# Patient Record
Sex: Male | Born: 1940
Health system: Southern US, Community
[De-identification: ages and names within clinical notes are randomized; demographics above are authoritative.]

## PROBLEM LIST (undated history)

## (undated) DIAGNOSIS — I712 Thoracic aortic aneurysm, without rupture, unspecified: Secondary | ICD-10-CM

## (undated) DIAGNOSIS — I429 Cardiomyopathy, unspecified: Secondary | ICD-10-CM

## (undated) DIAGNOSIS — I10 Essential (primary) hypertension: Secondary | ICD-10-CM

## (undated) DIAGNOSIS — E669 Obesity, unspecified: Secondary | ICD-10-CM

## (undated) DIAGNOSIS — M1990 Unspecified osteoarthritis, unspecified site: Secondary | ICD-10-CM

## (undated) DIAGNOSIS — I509 Heart failure, unspecified: Secondary | ICD-10-CM

## (undated) DIAGNOSIS — D696 Thrombocytopenia, unspecified: Secondary | ICD-10-CM

## (undated) DIAGNOSIS — E785 Hyperlipidemia, unspecified: Secondary | ICD-10-CM

## (undated) DIAGNOSIS — I428 Other cardiomyopathies: Secondary | ICD-10-CM

## (undated) DIAGNOSIS — I071 Rheumatic tricuspid insufficiency: Secondary | ICD-10-CM

## (undated) DIAGNOSIS — N1832 Chronic kidney disease, stage 3b: Secondary | ICD-10-CM

## (undated) DIAGNOSIS — I340 Nonrheumatic mitral (valve) insufficiency: Secondary | ICD-10-CM

## (undated) DIAGNOSIS — I5022 Chronic systolic (congestive) heart failure: Secondary | ICD-10-CM

## (undated) DIAGNOSIS — I2510 Atherosclerotic heart disease of native coronary artery without angina pectoris: Secondary | ICD-10-CM

## (undated) HISTORY — DX: Cardiomyopathy, unspecified: I42.9

## (undated) HISTORY — DX: Obesity, unspecified: E66.9

## (undated) HISTORY — DX: Unspecified osteoarthritis, unspecified site: M19.90

## (undated) HISTORY — DX: Essential (primary) hypertension: I10

## (undated) HISTORY — DX: Heart failure, unspecified: I50.9

## (undated) HISTORY — PX: KNEE SURGERY: SHX244

## (undated) HISTORY — DX: Atherosclerotic heart disease of native coronary artery without angina pectoris: I25.10

---

## 1998-06-10 ENCOUNTER — Emergency Department (HOSPITAL_COMMUNITY): Admission: EM | Admit: 1998-06-10 | Discharge: 1998-06-10 | Payer: Self-pay | Admitting: Emergency Medicine

## 1999-11-12 ENCOUNTER — Encounter: Payer: Self-pay | Admitting: Emergency Medicine

## 1999-11-12 ENCOUNTER — Inpatient Hospital Stay (HOSPITAL_COMMUNITY): Admission: EM | Admit: 1999-11-12 | Discharge: 1999-11-18 | Payer: Self-pay | Admitting: Emergency Medicine

## 1999-11-13 ENCOUNTER — Encounter: Payer: Self-pay | Admitting: Family Medicine

## 1999-11-16 ENCOUNTER — Encounter: Payer: Self-pay | Admitting: *Deleted

## 1999-11-30 ENCOUNTER — Ambulatory Visit (HOSPITAL_COMMUNITY): Admission: RE | Admit: 1999-11-30 | Discharge: 1999-11-30 | Payer: Self-pay | Admitting: *Deleted

## 2001-01-19 ENCOUNTER — Emergency Department (HOSPITAL_COMMUNITY): Admission: EM | Admit: 2001-01-19 | Discharge: 2001-01-19 | Payer: Self-pay | Admitting: Emergency Medicine

## 2006-02-02 ENCOUNTER — Ambulatory Visit (HOSPITAL_BASED_OUTPATIENT_CLINIC_OR_DEPARTMENT_OTHER): Admission: RE | Admit: 2006-02-02 | Discharge: 2006-02-02 | Payer: Self-pay | Admitting: Orthopedic Surgery

## 2008-01-31 ENCOUNTER — Ambulatory Visit (HOSPITAL_BASED_OUTPATIENT_CLINIC_OR_DEPARTMENT_OTHER): Admission: RE | Admit: 2008-01-31 | Discharge: 2008-01-31 | Payer: Self-pay | Admitting: Orthopedic Surgery

## 2010-06-28 NOTE — Op Note (Signed)
NAME:  Matthew [name], Matthew [name]              ACCOUNT NO.:  [id number]   MEDICAL RECORD NO.:  [id number]          PATIENT TYPE:  AMB   LOCATION:  DSC                          FACILITY:  [facility]   PHYSICIAN:  Harvie Junior, M.D.   DATE OF BIRTH:  Oct 12, 1940   DATE OF PROCEDURE:  DATE OF DISCHARGE:                               OPERATIVE REPORT   PREOPERATIVE DIAGNOSES:  Chronic knee pain with severe patellofemoral  arthritis and chondromalacia with failure of conservative care.   POSTOPERATIVE DIAGNOSES:  Chronic knee pain with severe patellofemoral  arthritis and chondromalacia with failure of conservative care.   PROCEDURES:  1. Arthroscopic debridement of lateral femoral condyle by way of      chondroplasty.  2. Arthroscopic debridement of patellofemoral joint by way of      chondroplasty.  3. Repeat lateral retinacular release.   SURGEON:  Harvie Junior, MD   ASSISTANT:  Marshia Ly, P.A.   ANESTHESIA:  General.   BRIEF HISTORY:  Matthew [name] is a gentleman with long history of having  had significant left knee pain, which we treated conservatively for  period of time, injection therapy, anti-inflammatory medication,  activity modification.  Because of continued complaints of pain, he was  ultimately taken to the operating room for operative knee arthroscopy.  He preoperatively had severe patellofemoral DJD and we have been trying  to treat him without the knee replacement surgery for a long period of  time and had been only moderately successful because of continued  complaints of pain.  At any rate we brought him back to the operating to  see if we could buy some time with an arthroscopic washout.   PROCEDURE:  The patient was taken to the operating room after adequate  anesthesia was obtained with general anesthetic, the patient was placed  supine on the operating table.  The left leg was then prepped and draped  in usual sterile fashion.  Following this, routine arthroscopic  examination revealed there was severe grade 4 changes in the  patellofemoral joint with reasonable tracking, but just severe change.  We ultimately elected to debride the patellofemoral joint and redo  lateral angle release, which had some tightness in the lateral side to  try to take as much pressure off the patellofemoral joint as possible.  Attention was turned to the medial compartment where the medial meniscus  was probed and felt to be normal.  The medial femoral condyle had some  grade 2 change, which was debrided, went laterally where there was some  grade 2 and grade 3 change in the lateral femoral condyle, as well as  the lateral meniscus which had fray at the edge, all this was debrided.  Attention was then turned to the ACL, which  was normal and the knee was then copiously and thoroughly irrigated with  9 L of normal saline, irrigation suctioned dry.  Laparoscopic portals  were closed with bandage, lateral side with the stitch and this patient  was taken to recovery room and was noted to be in satisfactory  condition.  Estimated blood loss for this procedure  was none.      Harvie Junior, M.D.  Electronically Signed     JLG/MEDQ  D:  01/31/2008  T:  01/31/2008  Job:  [id number]

## 2010-07-01 NOTE — Cardiovascular Report (Signed)
[facility]  Patient:    Matthew [name], Matthew [name]                     MRN: [id number] Proc. Date: 11/17/99 Adm. Date:  [id number] Attending:  Rollene Rotunda                        Cardiac Catheterization  PROCEDURE PERFORMED:  Attempted left heart catheterization and coronary angiography.  INDICATIONS FOR PROCEDURE:  New onset CHF, redistribution in the anterior apical lateral region and chest pain.  Previous echocardiogram had been preformed and revealed an ejection fraction of 30%.  Stress Cardiolite had revealed an ejection fraction of approximately 25%.  There was normal valvular morphology on the echocardiogram.  The left ventricular end-diastolic dimension was markedly increased at 70 mm, systolic dimension was 58 mm.  Left atrial enlargement at ______ mm.  DESCRIPTION OF PROCEDURE:  After obtaining written informed consent, the patient was brought to the cardiac catheterization lab in the postabsorptive state.  Preop sedation was achieved using IV Versed, IV fentanyl.  The right femoral head was identified using radiographic technique.  Selective coronary angiography was performed using a JL4, JR4 Judkins catheter; however, the catheters were 123 cm long to accommodate the body habitus of the patient.  Single plane ventriculogram was attempted in the RAO position, unable to advance the pigtail catheter to the left ventricle secondary to the marked distal tortuosity of the distal iliac.  All catheter exchanges were made over a guidewire.  FINDINGS:  The aorta pressure was 136/90.  CORONARY ANGIOGRAPHY:  Left main coronary artery:  The left main coronary artery bifurcated into the left anterior descending and circumflex vessel. There is no significant disease in the left main coronary artery.  Left anterior descending:  The left anterior descending gave rise to a large diagonal #1 and multiple septal perforators, ended as an apical  recurrent branch.  There was no significant disease in the left anterior descending. There was a 60-70% ostial lesion noted in the first diagonal.  The circumflex vessel was a large vessel that gave rise to a large bifurcating OM-1.  The lower limb of the first obtuse marginal had a 70% lesion.  The right coronary artery was dominant, gave rise to a small RV marginal #1, moderate sized RV marginal #2, ended with two PL branches.  There was luminal irregularities in the right coronary artery up to 30% in the proximal vessel.  FINAL IMPRESSION: 1. Borderline critical disease in the first diagonal and lower limb of the    first obtuse marginals.  The films were reviewed with Dr. Verdis Prime and    it was felt that medical management should be the patients initial option.     Should the patient fail adequate regimen consideration too,  percutaneous    revascularization would be considered. 2. Cardiomyopathy is out of proportion to his coronary disease and is most    likely related to hypertension and obesity. 3. Distal iliac tortuosity requiring all catheter exchanges to be made over a    guidewire. DD:  11/17/99 TD:  11/17/99 Job: [id number]

## 2010-07-01 NOTE — Discharge Summary (Signed)
[facility]  Patient:    Matthew [name], Matthew [name]                     MRN: [id number] Adm. Date:  [id number] Disc. Date: [id number] Attending:  Rollene Rotunda                           Discharge Summary  ADMITTING DIAGNOSES: 1. New onset congestive heart failure. 2. Hypertension well controlled. 3. Obesity.  DISCHARGE DIAGNOSES: 1. Coronary artery disease. 2. Congestive heart failure. 3. Hypertension. 4. Obesity. 5. Degenerative joint disease.  CONDITION ON DISCHARGE:  Stable.  DISPOSITION:  The patient discharged home.  DISCHARGE MEDICATIONS:  Atacand/HCT 15/12.5 mg daily, Toprol XL 50 mg daily, Vioxx 25 mg daily, enteric coated aspirin 325 mg daily.  FOLLOW-UP INSTRUCTIONS:  The patient will also follow-up in the congestive heart failure clinic.  EMPLOYMENT STATUS:  The patient is currently not to work until further notice.  HISTORY OF PRESENT ILLNESS:  This patient is a 70 year old black male who presented to the emergency room with a 2-3 day history of progressive shortness of breath. He stated he had been able to work only 6-7 hours before becoming short of breath but denied any chest pain or diaphoresis. He had not had presyncope or syncope. The patient was referred to on-call physician, Dr. Pecola Leisure, who assessed him and admitted him to the hospital.  For his past medical history, family history, personal history, social history, review of systems see admission history and physical.  PHYSICAL EXAMINATION:  VITAL SIGNS:  Revealed blood pressure 149/61, pulse 80, respirations 20, temperature 97.1, pulse ox 90% on room air. Weight 332.8 pounds.  HEENT:  Revealed PERRLA. EOMs were intact. Extraocular movements were normal.  NECK:  Supple with no jugular venous distention, bruits or thyromegaly.  HEART:  Regular rate and rhythm with no murmur or thrills.  CHEST:  Clear to auscultation but no rales or wheezes.  ABDOMEN:  Obese with bowel  sounds with no distention or tenderness.  EXTREMITIES:  Had dependent edema of his lower extremities.  NEUROLOGIC:  Revealed the patient to be grossly alert and otherwise intact.  LABORATORY DATA:  CBC revealed a white count of 8.1, hemoglobin 12.9, hematocrit 39, MCV of 76.8. RDW 15.2 with 62 neutrophils, 24 lymphs, 7 monocytes, 3 eosinophils and 1 basophil. Repeat CBC revealed a white count of 10.2 with hemoglobin 13 and hematocrit of 38.5 with a MCV of 76.2.  Chemistries initially were normal with repeat revealing potassium 3.4. BUN was 24 up from 16. Liver functions were normal. CK was initially 396 and reduced to 236 with a CK-MB initially 5 and reduced to 2.2 on repeat. Lipid panel revealed total cholesterol 161 with HDL 33 and LDL of 107, triglyceride of 106. TSH was 2.6. Vitamin B12 was 355, troponin I was less than 0.03. This was done serially x 3.  A 2-D echocardiogram revealed left ventricle moderately dilated left ventricular ejection fraction 30%, overall left ventricular function was moderately to markedly decreased. There was diffuse left ventricular hypokinesis, left ventricular wall thickness was mildly to moderately increased. There was mild aortic root dilatation, mild thickening of the mitral valve, mild mitral valve regurgitation, left atrium was moderately dilated and the right ventricle size was upper limits of normal. This was interpreted by Dr. Viann Fish. Stress Cardiolite test results are pending at the time of dictation. Chest x-ray revealed cardiomegaly and question of mild pulmonary vascular  congestion and probable pulmonary artery segments. Repeat revealed decreased interstitial edema with cardiomegaly and pulmonary venous hypertension persisting. Cardiac catheterization by Dr. Lemont Fillers. Fraser Din revealed borderline critical disease in the first diagonal and lower limb of the first obtuse marginals. The films were reviewed with Dr. Verdis Prime and it  was felt that medical management should be the patients initial option should to be fail adequate medical regimen consideration percutaneous revascularization will be considered. Cardiopathy is out of proportion to his coronary artery disease and most likely related to his hypertension and obesity. Distal iliac tortuosity required all catheter exchanges be made over a guide wire.  HOSPITAL COURSE:  The patient was admitted to the hospital and placed on telemetry unit. He was given nitroglycerin paste to his chest 1 inch over 6 hours and morphine sulfate 1-2 mg IV p.r.n. pain. He was given nasal cannula O2 to keep saturations greater than 92% and cardiac enzymes were done. He was given blood pressure medicines of Atacand 32 mg which was changed to Atacand/HCT 32-12.5 mg daily. Her was given K-Dur 4 mEq p.o. and consultations were obtained. The patient improved and was able to be ambulatory without significant distress. He related that he had been to a tent ministry where the preacher had him to stop taking his medicines because he was religiously healed. He said this happened in July of this year. His wife who is an R.N. was not aware of him having stopped his medicines. The patient was thus given instructions and is in agreement to comply with use of medicines and recommended course thusly discharged with outpatient management.   CONSULTANTS:  Dr. Melbourne Abts, M.D. DD:  12/22/99 TD:  12/22/99 Job: [id number]

## 2010-07-01 NOTE — Consult Note (Signed)
[facility]  Patient:    Matthew [name], Matthew [name]                     MRN: [id number] Proc. Date: 11/15/99 Adm. Date:  [id number] Attending:  Judie Petit CC:         Lorelle Formosa, M.D.   Consultation Report  REFERRING PHYSICIAN:  Lorelle Formosa, M.D.  REASON FOR CONSULTATION:  Shortness of breath and new-onset congestive heart failure.  HISTORY OF PRESENT ILLNESS:  Matthew [name] is a 70 year old African-American male, who presented to the emergency room with two to three days history of progressive shortness of breath associated with peripheral edema.  This is a new symptom for the patient.  He had been noncompliant with his antihypertensive for approximately two months.  The patient previously had been employed in Engineer, drilling at [employer] without significant shortness of breath while on duty.  The patient denies chest pain nor orthopnea, no presyncope or syncope.  His coronary risk factors include male sex, hypertension, obesity.  No significant family history of coronary artery disease, no history of diabetes.  Cholesterol status is unknown and is pending.  PAST MEDICAL HISTORY:  Significant for hypertension as noted above.  He has been treated intermittently for 25 years, has been noncompliant for approximately two months.  PAST SURGICAL HISTORY:  None.  MEDICATIONS ON ADMISSION:  None.  ALLERGIES:  No known drug allergies.  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  No history of tobacco, alcohol, or other drugs of abuse.  FAMILY HISTORY:  Significant for hypertension and diabetes.  REVIEW OF SYSTEMS:  Negative for change in bowel or bladder habits.  He has arthritic pain.  Has increasing shortness of breath as noted above.  No significant problems with urination.  Arthritic pain of his knees, for which he is currently on Vioxx.  REVIEW OF CHART:  He has had  complaint of a dull ache in his chest for approximately three to four days.  History is significant for obesity.  The patient has been independent at home.  PHYSICAL EXAMINATION:  GENERAL:  Physical exam reveals a morbidly obese male.  VITAL SIGNS:  Initial blood pressure on admission:  Systolic pressure was 183/121.  Systolic blood pressure is currently ranging 148-175/70-96 diastolic.  Heart rate has ranged from 80-100.  Telemetry has revealed a sinus rhythm without significant ectopy.  His O2 saturation has been 98% on room air.  His weight on admission was 332 pounds.  HEENT:  Unremarkable.  No evidence of xanthelasma.  NECK:  Unable to assess for JVD secondary to his thick neck, but none is obvious.  There is no obvious carotid bruit.  Good carotid upstroke.  LUNGS:  Breath sounds are equal and clear to auscultation.  There are no crackles noted today.  There is no use of accessory muscles.  HEART:  Heart tones are distant with a regular rate and rhythm.  I am unable to appreciate an S3.  PMI is slightly displaced laterally.  ABDOMEN:  Morbidly obese.  No unusual bruits or pulsations are noted.  Unable to palpate for hepatosplenomegaly secondary to morbid obesity.  EXTREMITIES:  Extremities do not reveal any significant clubbing or cyanosis. There is trace peripheral edema.  NEUROLOGIC:  Nonfocal.  [name] is appropriate.  Understanding of disease problem is limited.  He is pleasant, interacts well with this examiner.  Motor is 5/5 throughout.  Gait is  within normal limits.  SKIN:  Warm and dry.  DIAGNOSTIC EVALUATION:  His chart was reviewed.  Echo findings were reviewed. The patient was noted to have an ejection fraction of 30%, mild to moderate concentric hypertrophy, mild to moderate LV dilatation with mild left atrial enlargement.  Chest x-ray revealed cardiomegaly with mild pulmonary edema. Serial cardiac enzymes were negative.  Troponin Is were negative.  CBC  was within normal limits.  Electrolytes were within normal limits.  Thyroid function tests are currently pending.  Calcium is within normal limits.  MEDICATIONS:  Medications were reviewed.  The patient currently is on Toprol XL 50 mg p.o. q.d.  This was recently added.  Norvasc 10 mg p.o. q.d.  Avapro 300 mg p.o. q.d.  Hydrochlorothiazide 12.5 mg q.d.  Vioxx 25 mg p.o. q.d. Morphine p.r.n. as needed for arthritic pain and chest pain.  Tylenol p.r.n.  ALLERGIES:  The patient has no known drug allergies.  IMPRESSION:  Probable hypertensive obesity-related cardiomyopathy with an ejection fraction of 30%.  The patients pulmonary edema has been appropriately treated with diuresis and ACE inhibition.  His electrolytes have remained stable throughout this.  He is currently on a good antihypertensive regimen.  His blood pressure is at times inappropriately high.  The patients chest pain symptoms have resolved.  His thyroid function tests remain pending.  RECOMMENDATION:  Continue with his current antihypertensive regimen.  He will be given an additional Lasix 40 mg IV today for better antihypertensive control.  A stress Cardiolite will be performed for evaluation for a possible ischemic etiology of his cardiomyopathy.  A B12 level will be obtained as well.  A stress Cardiolite will be performed as noted above.  Further recommendations will be pending the outcome of the stress Cardiolite.  In addition, the patient should be enrolled in a congestive heart failure clinic for the educational component. DD:  11/15/99 TD:  11/15/99 Job: [id number]

## 2010-07-01 NOTE — Op Note (Signed)
NAMEHY, SWIATEK              ACCOUNT NO.:  [id number]   MEDICAL RECORD NO.:  [id number]          PATIENT TYPE:  AMB   LOCATION:  DSC                          FACILITY:  [facility]   PHYSICIAN:  Harvie Junior, M.D.   DATE OF BIRTH:  07/26/1939   DATE OF PROCEDURE:  02/02/2006  DATE OF DISCHARGE:                               OPERATIVE REPORT   PREOPERATIVE DIAGNOSIS:  Severe chondromalacia of patella with grade 4  changes in patellofemoral joint, with questionable medial meniscal tear.   POSTOPERATIVE DIAGNOSES:  1. Severe chondromalacia of patellofemoral joint.  2. Multiple osteocartilaginous loose bodies.  3. Tight lateral retinaculum.   PRINCIPAL PROCEDURES:  1. Debridement of chondromalacia of patella by way of chondroplasty.  2. Removal of osteocartilaginous loose bodies.  3. Lateral retinacular release.   SURGEON:  Harvie Junior, M.D.   Threasa HeadsOrma Flaming.   ANESTHESIA:  General.   BRIEF HISTORY:  Mr.  Cassarino is a 70 year old make with a long history of  having had significant left knee pain.  He had been treated  conservatively for a long period of time with anti-inflammatory  medication injection therapy.  X-ray showed that he had severe grade 4  chondromalacia of the patellofemoral joint.  The remainder of his joint  looked reasonable in terms of spacing.  We talked about treatment  options including continued exercise, anti-inflammatory medication.  He  ultimately elected for surgical intervention.  He was brought to the  operating room for this procedure.  We told him preoperatively we felt  that a lateral retinacular release was indicated although it was unclear  how much relief it would give him.  We are hopeful that it will buy him  some period of time and perhaps some long-term relief, and he was  brought to the operating room with this in mind.   PROCEDURE:  The patient was brought to the operating room.  After  adequate anesthesia was obtained with  general anesthetic, the patient  was placed supine on the operating room table.  The left leg was prepped  and draped in sterile fashion.  Following this, routine arthroscopic  examination of the knee revealed there was obvious grade 4 change up on  the patella.  Almost the entire posterior surface of the patella was  bone, especially going over laterally as well as centrally.  The  trochlear had some articular cartilage left but certainly had some grade  2 and 3 change.  This was debrided back to a smooth and stable rim.  Attention was then turned into the medial gutter and the medial  compartment where there was noted to be no significant abnormality with  the meniscus.  There did appear to be multiple crystalline structures  adherent to the articular cartilage in the medial and lateral  compartments.  These were washed and lavaged.  We did not debride these  off of the articular cartilage.  Several cartilaginous loose bodies came  into play during the case and these were debrided from the knee; small  enough, certainly they could be the kind that could get into  the  weightbearing area of the joint.  Attention was turned over laterally  where the lateral meniscus was within normal limits.  The ACL looked  reasonable.  The lateral tibial plateau was debrided as well.  Attention  was turned back up into the patellofemoral joint where the lateral  retinaculum was identified and released from three fingerbreadths  proximal to the patella, to the lateral joint line.  Care was taken to  make sure that this had been accomplished adequately with a probe and  scissors, and this was adequately done.   Once this was accomplished, the knee was copiously and thoroughly  irrigated with 6 liters normal saline irrigation and suctioned dry.  The  arthroscopic portals were closed with a bandage.  Sterile compressive  dressing was applied, and the patient was taken to the recovery room.  She was noted to  be in a satisfactory condition.   ESTIMATED BLOOD LOSS:  None.      Harvie Junior, M.D.  Electronically Signed     JLG/MEDQ  D:  02/02/2006  T:  02/03/2006  Job:  [id number]

## 2010-11-18 LAB — BASIC METABOLIC PANEL
BUN: 12 mg/dL (ref 6–23)
CO2: 22 mEq/L (ref 19–32)
Calcium: 8.7 mg/dL (ref 8.4–10.5)
Chloride: 104 mEq/L (ref 96–112)
Creatinine, Ser: 0.85 mg/dL (ref 0.4–1.5)
GFR calc Af Amer: >60 mL/min (ref >60)
GFR calc non Af Amer: >60 mL/min (ref >60)
Glucose, Bld: 98 mg/dL (ref 70–99)
Potassium: 4.1 mEq/L (ref 3.5–5.1)
Sodium: 135 mEq/L (ref 135–145)

## 2010-11-18 LAB — POCT HEMOGLOBIN-HEMACUE: Hemoglobin: 12 g/dL — ABNORMAL LOW (ref 13.0–17.0)

## 2011-03-30 DIAGNOSIS — M1710 Unilateral primary osteoarthritis, unspecified knee: Secondary | ICD-10-CM | POA: Diagnosis not present

## 2011-03-30 DIAGNOSIS — M19019 Primary osteoarthritis, unspecified shoulder: Secondary | ICD-10-CM | POA: Diagnosis not present

## 2011-06-20 DIAGNOSIS — I428 Other cardiomyopathies: Secondary | ICD-10-CM | POA: Diagnosis not present

## 2011-06-20 DIAGNOSIS — I10 Essential (primary) hypertension: Secondary | ICD-10-CM | POA: Diagnosis not present

## 2011-10-24 DIAGNOSIS — M1710 Unilateral primary osteoarthritis, unspecified knee: Secondary | ICD-10-CM | POA: Diagnosis not present

## 2011-10-24 DIAGNOSIS — M19019 Primary osteoarthritis, unspecified shoulder: Secondary | ICD-10-CM | POA: Diagnosis not present

## 2011-10-24 DIAGNOSIS — M545 Low back pain, unspecified: Secondary | ICD-10-CM | POA: Diagnosis not present

## 2012-01-03 DIAGNOSIS — I509 Heart failure, unspecified: Secondary | ICD-10-CM | POA: Diagnosis not present

## 2012-01-03 DIAGNOSIS — I428 Other cardiomyopathies: Secondary | ICD-10-CM | POA: Diagnosis not present

## 2012-01-03 DIAGNOSIS — Z23 Encounter for immunization: Secondary | ICD-10-CM | POA: Diagnosis not present

## 2012-01-03 DIAGNOSIS — I10 Essential (primary) hypertension: Secondary | ICD-10-CM | POA: Diagnosis not present

## 2012-02-05 DIAGNOSIS — M25569 Pain in unspecified knee: Secondary | ICD-10-CM | POA: Diagnosis not present

## 2012-02-05 DIAGNOSIS — M19019 Primary osteoarthritis, unspecified shoulder: Secondary | ICD-10-CM | POA: Diagnosis not present

## 2012-04-15 DIAGNOSIS — M1610 Unilateral primary osteoarthritis, unspecified hip: Secondary | ICD-10-CM | POA: Diagnosis not present

## 2012-04-15 DIAGNOSIS — M1710 Unilateral primary osteoarthritis, unspecified knee: Secondary | ICD-10-CM | POA: Diagnosis not present

## 2012-04-15 DIAGNOSIS — M169 Osteoarthritis of hip, unspecified: Secondary | ICD-10-CM | POA: Diagnosis not present

## 2012-04-15 DIAGNOSIS — M19019 Primary osteoarthritis, unspecified shoulder: Secondary | ICD-10-CM | POA: Diagnosis not present

## 2012-04-15 DIAGNOSIS — M25069 Hemarthrosis, unspecified knee: Secondary | ICD-10-CM | POA: Diagnosis not present

## 2012-05-06 DIAGNOSIS — M25569 Pain in unspecified knee: Secondary | ICD-10-CM | POA: Diagnosis not present

## 2012-05-06 DIAGNOSIS — M19019 Primary osteoarthritis, unspecified shoulder: Secondary | ICD-10-CM | POA: Diagnosis not present

## 2012-05-06 DIAGNOSIS — M1710 Unilateral primary osteoarthritis, unspecified knee: Secondary | ICD-10-CM | POA: Diagnosis not present

## 2012-05-08 DIAGNOSIS — I2510 Atherosclerotic heart disease of native coronary artery without angina pectoris: Secondary | ICD-10-CM | POA: Diagnosis not present

## 2012-05-08 DIAGNOSIS — Z79899 Other long term (current) drug therapy: Secondary | ICD-10-CM | POA: Diagnosis not present

## 2012-06-03 DIAGNOSIS — M25559 Pain in unspecified hip: Secondary | ICD-10-CM | POA: Diagnosis not present

## 2012-06-13 DIAGNOSIS — M25559 Pain in unspecified hip: Secondary | ICD-10-CM | POA: Diagnosis not present

## 2012-06-27 DIAGNOSIS — M169 Osteoarthritis of hip, unspecified: Secondary | ICD-10-CM | POA: Diagnosis not present

## 2012-06-27 DIAGNOSIS — M1710 Unilateral primary osteoarthritis, unspecified knee: Secondary | ICD-10-CM | POA: Diagnosis not present

## 2012-07-09 DIAGNOSIS — I10 Essential (primary) hypertension: Secondary | ICD-10-CM | POA: Diagnosis not present

## 2012-07-09 DIAGNOSIS — I509 Heart failure, unspecified: Secondary | ICD-10-CM | POA: Diagnosis not present

## 2012-07-09 DIAGNOSIS — I428 Other cardiomyopathies: Secondary | ICD-10-CM | POA: Diagnosis not present

## 2012-07-09 DIAGNOSIS — I2510 Atherosclerotic heart disease of native coronary artery without angina pectoris: Secondary | ICD-10-CM | POA: Diagnosis not present

## 2012-07-15 DIAGNOSIS — I10 Essential (primary) hypertension: Secondary | ICD-10-CM | POA: Diagnosis not present

## 2012-07-15 DIAGNOSIS — I428 Other cardiomyopathies: Secondary | ICD-10-CM | POA: Diagnosis not present

## 2012-07-15 DIAGNOSIS — I2510 Atherosclerotic heart disease of native coronary artery without angina pectoris: Secondary | ICD-10-CM | POA: Diagnosis not present

## 2012-07-15 DIAGNOSIS — I509 Heart failure, unspecified: Secondary | ICD-10-CM | POA: Diagnosis not present

## 2012-10-31 DIAGNOSIS — Z23 Encounter for immunization: Secondary | ICD-10-CM | POA: Diagnosis not present

## 2012-12-05 DIAGNOSIS — M25469 Effusion, unspecified knee: Secondary | ICD-10-CM | POA: Diagnosis not present

## 2012-12-05 DIAGNOSIS — M67919 Unspecified disorder of synovium and tendon, unspecified shoulder: Secondary | ICD-10-CM | POA: Diagnosis not present

## 2012-12-05 DIAGNOSIS — M1710 Unilateral primary osteoarthritis, unspecified knee: Secondary | ICD-10-CM | POA: Diagnosis not present

## 2012-12-05 DIAGNOSIS — M25519 Pain in unspecified shoulder: Secondary | ICD-10-CM | POA: Diagnosis not present

## 2013-01-04 ENCOUNTER — Encounter: Payer: Self-pay | Admitting: *Deleted

## 2013-01-04 ENCOUNTER — Encounter: Payer: Self-pay | Admitting: Interventional Cardiology

## 2013-01-07 ENCOUNTER — Ambulatory Visit (INDEPENDENT_AMBULATORY_CARE_PROVIDER_SITE_OTHER): Payer: Medicare Other | Admitting: Interventional Cardiology

## 2013-01-07 ENCOUNTER — Encounter: Payer: Self-pay | Admitting: Interventional Cardiology

## 2013-01-07 VITALS — BP 126/78 | HR 74 | Ht 79.0 in | Wt 277.0 lb

## 2013-01-07 DIAGNOSIS — I428 Other cardiomyopathies: Secondary | ICD-10-CM | POA: Diagnosis not present

## 2013-01-07 DIAGNOSIS — I10 Essential (primary) hypertension: Secondary | ICD-10-CM

## 2013-01-07 DIAGNOSIS — I2510 Atherosclerotic heart disease of native coronary artery without angina pectoris: Secondary | ICD-10-CM | POA: Diagnosis not present

## 2013-01-07 DIAGNOSIS — E669 Obesity, unspecified: Secondary | ICD-10-CM

## 2013-01-07 DIAGNOSIS — E782 Mixed hyperlipidemia: Secondary | ICD-10-CM

## 2013-01-07 DIAGNOSIS — I429 Cardiomyopathy, unspecified: Secondary | ICD-10-CM | POA: Insufficient documentation

## 2013-01-07 NOTE — Patient Instructions (Signed)
Your physician wants you to follow-up in: 6 Months with Dr. Eldridge Dace. You will receive a reminder letter in the mail two months in advance. If you don't receive a letter, please call our office to schedule the follow-up appointment.  Your physician recommends that you return for lab work on 04/28/13 Fasting for lipid and cmet.  Your physician recommends that you continue on your current medications as directed. Please refer to the Current Medication list given to you today.

## 2013-01-07 NOTE — Progress Notes (Signed)
Patient ID: Matthew [name], male   DOB: 25-Sep-1940, 72 y.o.   MRN: [id number]    [address] [place], [place]  [address] Phone: [phone] Fax:  [phone]  Date:  01/07/2013   ID:  Matthew [name], DOB February 18, 1940, MRN [id number]  PCP:  No primary provider on file.      History of Present Illness: Matthew [name] is a 72 y.o. male who has had issues with HTN and obesity. He lost some weight. He has been walking some too. No problems with that. No chest pain. He has avoided fast food places. BP is checked at drug store. Readings are in the 120-130s. Hypertension:  Denies : Chest pain.  Dizziness.  Leg edema.  Paroxysmal nocturnal dyspnea.  Palpitations.  Shortness of breath.  Dyspnea.  Syncope.     Wt Readings from Last 3 Encounters:  01/07/13 277 lb (125.646 kg)     Past Medical History  Diagnosis Date  . Obesity   . HTN (hypertension)   . Osteoarthritis   . Cardiomyopathy   . Heart failure   . CAD (coronary artery disease)     Current Outpatient Prescriptions  Medication Sig Dispense Refill  . amLODipine (NORVASC) 10 MG tablet Take 1 tablet by mouth daily.      Marland Kitchen aspirin 81 MG tablet Take 81 mg by mouth daily.      Marland Kitchen atorvastatin (LIPITOR) 10 MG tablet Take 10 mg by mouth daily.      . carvedilol (COREG) 25 MG tablet Take 25 mg by mouth 2 (two) times daily with a meal.      . ramipril (ALTACE) 10 MG capsule Take 10 mg by mouth daily.      Marland Kitchen spironolactone (ALDACTONE) 25 MG tablet Take 25 mg by mouth daily.      . traMADol (ULTRAM) 50 MG tablet Take by mouth every 6 (six) hours as needed.       No current facility-administered medications for this visit.    Allergies:   No Known Allergies  Social History:  The patient  reports that he has never smoked. He does not have any smokeless tobacco history on file. He reports that he does not drink alcohol or use illicit drugs.   Family History:  The patient's family history includes Diabetes in  his brother; Hypertension in his father.   ROS:  Please see the history of present illness.  No nausea, vomiting.  No fevers, chills.  No focal weakness.  No dysuria.    All other systems reviewed and negative.   PHYSICAL EXAM: VS:  BP 126/78  Pulse 74  Ht 6\' 7"  (2.007 m)  Wt 277 lb (125.646 kg)  BMI 31.19 kg/m2 Well nourished, well developed, in no acute distress HEENT: normal Neck: no JVD, no carotid bruits Cardiac:  normal S1, S2; RRR;  Lungs:  clear to auscultation bilaterally, no wheezing, rhonchi or rales Abd: soft, nontender, no hepatomegaly Ext: no edema Skin: warm and dry Neuro:   no focal abnormalities noted  EKG:  NSR, RBBB, no ST segment changes     ASSESSMENT AND PLAN:  Other primary cardiomyopathies  IMAGING: EC Echocardiogram (Ordered for 07/09/2012)   [name],Matthew 07/16/2012 01:00:59 PM > Echo report charted. [name],Matthew 07/17/2012 01:34:20 PM > Pt notified. Pt has follow up 11/14.   Notes: Last EF 47% in 2009. No symptoms of CHF.  EF 50-55% in 5/14. 2. Essential hypertension, benign  Continue Carvedilol Tablet, 25 MG, 1  tablet with food, Orally, BID, 180, Refills 3 Continue Ramipril Tablet, 10 MG, 1 capsule, Orally, Twice a day, 180, Refills 3 Continue Amlodipine Besylate Tablet, 10 MG, 1 tablet, Orally, Once a day, 90, Refills 3 Continue Spironolactone Tablet, 25 MG, 1 tablet, Orally, Daily, 90 days, 90, Refills 3 Notes: Controlled at home.  3. Morbid obesity  Notes: He has kept 50 lbs off. He is trying to lose another 15 lbs. Ultimately, he is trying to get to 250 lbs.  4. Congestive heart failure, unspecified  Notes: CHF in the past. Resolved over the past few years.  5. Coronary atherosclerosis of native coronary artery  Notes: Diagonal disease on cath in 2001. No sx form CAD. 3/14: LDL 60, HDL 46, TG 46.   Signed, Fredric Mare, MD, [facility] 01/07/2013 10:58 AM

## 2013-01-26 ENCOUNTER — Other Ambulatory Visit: Payer: Self-pay | Admitting: Interventional Cardiology

## 2013-01-28 ENCOUNTER — Encounter: Payer: Self-pay | Admitting: Interventional Cardiology

## 2013-04-28 ENCOUNTER — Other Ambulatory Visit: Payer: BC Managed Care – PPO

## 2013-05-27 DIAGNOSIS — M719 Bursopathy, unspecified: Secondary | ICD-10-CM | POA: Diagnosis not present

## 2013-05-27 DIAGNOSIS — M1710 Unilateral primary osteoarthritis, unspecified knee: Secondary | ICD-10-CM | POA: Diagnosis not present

## 2013-05-27 DIAGNOSIS — M67919 Unspecified disorder of synovium and tendon, unspecified shoulder: Secondary | ICD-10-CM | POA: Diagnosis not present

## 2013-05-27 DIAGNOSIS — M25519 Pain in unspecified shoulder: Secondary | ICD-10-CM | POA: Diagnosis not present

## 2013-06-05 DIAGNOSIS — M25569 Pain in unspecified knee: Secondary | ICD-10-CM | POA: Diagnosis not present

## 2013-06-05 DIAGNOSIS — M1710 Unilateral primary osteoarthritis, unspecified knee: Secondary | ICD-10-CM | POA: Diagnosis not present

## 2013-07-08 ENCOUNTER — Other Ambulatory Visit: Payer: Self-pay

## 2013-07-08 MED ORDER — RAMIPRIL 10 MG PO CAPS: ORAL_CAPSULE | ORAL | Status: DC

## 2013-07-09 ENCOUNTER — Encounter (INDEPENDENT_AMBULATORY_CARE_PROVIDER_SITE_OTHER): Payer: Self-pay

## 2013-07-09 ENCOUNTER — Ambulatory Visit (INDEPENDENT_AMBULATORY_CARE_PROVIDER_SITE_OTHER): Payer: Medicare Other | Admitting: Interventional Cardiology

## 2013-07-09 ENCOUNTER — Encounter: Payer: Self-pay | Admitting: Interventional Cardiology

## 2013-07-09 ENCOUNTER — Other Ambulatory Visit: Payer: Medicare Other

## 2013-07-09 VITALS — BP 156/74 | HR 97 | Ht 79.0 in | Wt 263.0 lb

## 2013-07-09 DIAGNOSIS — I2510 Atherosclerotic heart disease of native coronary artery without angina pectoris: Secondary | ICD-10-CM | POA: Diagnosis not present

## 2013-07-09 DIAGNOSIS — I10 Essential (primary) hypertension: Secondary | ICD-10-CM

## 2013-07-09 DIAGNOSIS — Z01810 Encounter for preprocedural cardiovascular examination: Secondary | ICD-10-CM

## 2013-07-09 DIAGNOSIS — E782 Mixed hyperlipidemia: Secondary | ICD-10-CM | POA: Diagnosis not present

## 2013-07-09 DIAGNOSIS — E669 Obesity, unspecified: Secondary | ICD-10-CM

## 2013-07-09 DIAGNOSIS — I428 Other cardiomyopathies: Secondary | ICD-10-CM

## 2013-07-09 MED ORDER — SPIRONOLACTONE 25 MG PO TABS: ORAL_TABLET | ORAL | Status: DC

## 2013-07-09 MED ORDER — CARVEDILOL 25 MG PO TABS: ORAL_TABLET | ORAL | Status: DC

## 2013-07-09 MED ORDER — RAMIPRIL 10 MG PO CAPS: ORAL_CAPSULE | ORAL | Status: DC

## 2013-07-09 MED ORDER — ATORVASTATIN CALCIUM 10 MG PO TABS: 10.0000 mg | ORAL_TABLET | Freq: Every day | ORAL | Status: DC

## 2013-07-09 MED ORDER — AMLODIPINE BESYLATE 10 MG PO TABS: ORAL_TABLET | ORAL | Status: DC

## 2013-07-09 NOTE — Patient Instructions (Signed)
Your physician recommends that you continue on your current medications as directed. Please refer to the Current Medication list given to you today.  Your physician wants you to follow-up in: 6 months with Dr. Varanasi.  You will receive a reminder letter in the mail two months in advance. If you don't receive a letter, please call our office to schedule the follow-up appointment.  

## 2013-07-09 NOTE — Progress Notes (Signed)
Patient ID: Matthew AmisClifton J Diloreto, male   DOB: [birth year], 73 y.o.   MRN: [id number] Patient ID: Matthew AmisClifton J Fife, male   DOB: [birth year], 73 y.o.   MRN: [id number]    [address] [place], [place]  [address] Phone: [phone] Fax:  [phone]  Date:  07/09/2013   ID:  Matthew AmisClifton J Crosson, DOB [birth year], MRN [id number]  PCP:  Billee CashingMCKENZIE, WAYLAND, MD      History of Present Illness: Matthew [name] is a 73 y.o. male who has had issues with HTN and obesity. He lost some weight. He has been walking some too. No problems with that. No chest pain. He has avoided fast food places. BP is checked at drug store. Readings are in the 120-130s. Hypertension:  Denies : Chest pain.  Dizziness.  Leg edema.  Paroxysmal nocturnal dyspnea.  Palpitations.  Shortness of breath.  Dyspnea.  Syncope.     Wt Readings from Last 3 Encounters:  07/09/13 263 lb (119.296 kg)  01/07/13 277 lb (125.646 kg)     Past Medical History  Diagnosis Date  . Obesity   . HTN (hypertension)   . Osteoarthritis   . Cardiomyopathy   . Heart failure   . CAD (coronary artery disease)     Current Outpatient Prescriptions  Medication Sig Dispense Refill  . amLODipine (NORVASC) 10 MG tablet take 1 tablet by mouth once daily  90 tablet  1  . aspirin 81 MG tablet Take 81 mg by mouth daily.      Marland Kitchen. atorvastatin (LIPITOR) 10 MG tablet Take 10 mg by mouth daily.      . carvedilol (COREG) 25 MG tablet take 1 tablet by mouth twice a day with food  180 tablet  1  . ramipril (ALTACE) 10 MG capsule take 1 capsule by mouth twice a day  180 capsule  1  . spironolactone (ALDACTONE) 25 MG tablet take 1 tablet by mouth once daily  90 tablet  1  . traMADol (ULTRAM) 50 MG tablet Take by mouth every 6 (six) hours as needed.       No current facility-administered medications for this visit.    Allergies:   No Known Allergies  Social History:  The patient  reports that he has never smoked. He does not have any smokeless  tobacco history on file. He reports that he does not drink alcohol or use illicit drugs.   Family History:  The patient's family history includes Diabetes in his brother; Hypertension in his father.   ROS:  Please see the history of present illness.  No nausea, vomiting.  No fevers, chills.  No focal weakness.  No dysuria.    All other systems reviewed and negative.   PHYSICAL EXAM: VS:  BP 156/74  Pulse 97  Ht 6\' 7"  (2.007 m)  Wt 263 lb (119.296 kg)  BMI 29.62 kg/m2 Well nourished, well developed, in no acute distress HEENT: normal Neck: no JVD, no carotid bruits Cardiac:  normal S1, S2; RRR;  Lungs:  clear to auscultation bilaterally, no wheezing, rhonchi or rales Abd: soft, nontender, no hepatomegaly Ext: no edema Skin: warm and dry Neuro:   no focal abnormalities noted  EKG:  NSR, RBBB, no ST segment changes   , PVCs, no change  ASSESSMENT AND PLAN:  Preoperative evaluation: Echo from 6/14 reviewed.  EF 55%.  No further cardiac testing needed before arthroscopic knee surgery.  No CP or SHOB.  He continues to feel better with  weight loss.   Other primary cardiomyopathies  IMAGING: EC Echocardiogram 6/14 :  EF normalized.     Notes: Last EF 47% in 2009. No symptoms of CHF.  EF 50-55% in 5/14. 2. Essential hypertension, benign  Continue Carvedilol Tablet, 25 MG, 1 tablet with food, Orally, BID, 180, Refills 3 Continue Ramipril Tablet, 10 MG, 1 capsule, Orally, Twice a day, 180, Refills 3 Continue Amlodipine Besylate Tablet, 10 MG, 1 tablet, Orally, Once a day, 90, Refills 3 Continue Spironolactone Tablet, 25 MG, 1 tablet, Orally, Daily, 90 days, 90, Refills 3 Notes: Controlled at home.  3. Morbid obesity  Notes: He has kept 85 lbs off. He is trying to lose another 15 lbs. Ultimately, he is trying to get to 250 lbs.  4. Congestive heart failure, unspecified  Notes: CHF in the past. Resolved over the past few years.  5. Coronary atherosclerosis of native coronary artery   Notes: Diagonal disease on cath in 2001. No sx form CAD. 3/14: LDL 60, HDL 46, TG 46.   Signed, Fredric Mare, MD, [facility] 07/09/2013 10:30 AM

## 2013-07-14 DIAGNOSIS — M1710 Unilateral primary osteoarthritis, unspecified knee: Secondary | ICD-10-CM | POA: Diagnosis not present

## 2013-07-14 DIAGNOSIS — M23302 Other meniscus derangements, unspecified lateral meniscus, unspecified knee: Secondary | ICD-10-CM | POA: Diagnosis not present

## 2013-07-14 DIAGNOSIS — M2240 Chondromalacia patellae, unspecified knee: Secondary | ICD-10-CM | POA: Diagnosis not present

## 2013-07-14 DIAGNOSIS — M9420 Chondromalacia, unspecified site: Secondary | ICD-10-CM | POA: Diagnosis not present

## 2013-07-14 DIAGNOSIS — S83289A Other tear of lateral meniscus, current injury, unspecified knee, initial encounter: Secondary | ICD-10-CM | POA: Diagnosis not present

## 2013-07-14 DIAGNOSIS — M23319 Other meniscus derangements, anterior horn of medial meniscus, unspecified knee: Secondary | ICD-10-CM | POA: Diagnosis not present

## 2013-07-14 DIAGNOSIS — IMO0002 Reserved for concepts with insufficient information to code with codable children: Secondary | ICD-10-CM | POA: Diagnosis not present

## 2013-07-24 DIAGNOSIS — M25569 Pain in unspecified knee: Secondary | ICD-10-CM | POA: Diagnosis not present

## 2013-07-25 DIAGNOSIS — M25569 Pain in unspecified knee: Secondary | ICD-10-CM | POA: Diagnosis not present

## 2013-07-29 DIAGNOSIS — M25569 Pain in unspecified knee: Secondary | ICD-10-CM | POA: Diagnosis not present

## 2013-08-01 DIAGNOSIS — M25569 Pain in unspecified knee: Secondary | ICD-10-CM | POA: Diagnosis not present

## 2013-08-05 DIAGNOSIS — M25569 Pain in unspecified knee: Secondary | ICD-10-CM | POA: Diagnosis not present

## 2013-08-08 DIAGNOSIS — M25569 Pain in unspecified knee: Secondary | ICD-10-CM | POA: Diagnosis not present

## 2013-08-12 DIAGNOSIS — M25569 Pain in unspecified knee: Secondary | ICD-10-CM | POA: Diagnosis not present

## 2013-08-14 DIAGNOSIS — M25569 Pain in unspecified knee: Secondary | ICD-10-CM | POA: Diagnosis not present

## 2013-08-19 DIAGNOSIS — M25569 Pain in unspecified knee: Secondary | ICD-10-CM | POA: Diagnosis not present

## 2013-08-21 DIAGNOSIS — M25569 Pain in unspecified knee: Secondary | ICD-10-CM | POA: Diagnosis not present

## 2013-08-29 DIAGNOSIS — M25569 Pain in unspecified knee: Secondary | ICD-10-CM | POA: Diagnosis not present

## 2013-09-01 DIAGNOSIS — M25569 Pain in unspecified knee: Secondary | ICD-10-CM | POA: Diagnosis not present

## 2013-09-03 DIAGNOSIS — M25569 Pain in unspecified knee: Secondary | ICD-10-CM | POA: Diagnosis not present

## 2013-09-10 DIAGNOSIS — M25569 Pain in unspecified knee: Secondary | ICD-10-CM | POA: Diagnosis not present

## 2013-09-12 DIAGNOSIS — M25569 Pain in unspecified knee: Secondary | ICD-10-CM | POA: Diagnosis not present

## 2013-09-15 DIAGNOSIS — M25569 Pain in unspecified knee: Secondary | ICD-10-CM | POA: Diagnosis not present

## 2013-09-17 DIAGNOSIS — M25569 Pain in unspecified knee: Secondary | ICD-10-CM | POA: Diagnosis not present

## 2013-09-24 DIAGNOSIS — M25569 Pain in unspecified knee: Secondary | ICD-10-CM | POA: Diagnosis not present

## 2013-09-24 DIAGNOSIS — M1710 Unilateral primary osteoarthritis, unspecified knee: Secondary | ICD-10-CM | POA: Diagnosis not present

## 2013-09-25 DIAGNOSIS — M25569 Pain in unspecified knee: Secondary | ICD-10-CM | POA: Diagnosis not present

## 2013-09-26 DIAGNOSIS — M25569 Pain in unspecified knee: Secondary | ICD-10-CM | POA: Diagnosis not present

## 2013-09-29 DIAGNOSIS — M25569 Pain in unspecified knee: Secondary | ICD-10-CM | POA: Diagnosis not present

## 2013-10-01 DIAGNOSIS — M25569 Pain in unspecified knee: Secondary | ICD-10-CM | POA: Diagnosis not present

## 2013-11-03 DIAGNOSIS — Z23 Encounter for immunization: Secondary | ICD-10-CM | POA: Diagnosis not present

## 2013-12-11 ENCOUNTER — Encounter: Payer: Self-pay | Admitting: Interventional Cardiology

## 2013-12-11 ENCOUNTER — Ambulatory Visit (INDEPENDENT_AMBULATORY_CARE_PROVIDER_SITE_OTHER): Payer: Medicare Other | Admitting: Interventional Cardiology

## 2013-12-11 VITALS — BP 130/70 | HR 72 | Ht 79.0 in | Wt 275.0 lb

## 2013-12-11 DIAGNOSIS — I2510 Atherosclerotic heart disease of native coronary artery without angina pectoris: Secondary | ICD-10-CM

## 2013-12-11 DIAGNOSIS — E782 Mixed hyperlipidemia: Secondary | ICD-10-CM

## 2013-12-11 DIAGNOSIS — I10 Essential (primary) hypertension: Secondary | ICD-10-CM

## 2013-12-11 DIAGNOSIS — E669 Obesity, unspecified: Secondary | ICD-10-CM

## 2013-12-11 DIAGNOSIS — I420 Dilated cardiomyopathy: Secondary | ICD-10-CM

## 2013-12-11 LAB — BASIC METABOLIC PANEL
BUN: 13 mg/dL (ref 6–23)
CHLORIDE: 104 meq/L (ref 96–112)
CO2: 29 meq/L (ref 19–32)
CREATININE: 0.9 mg/dL (ref 0.4–1.5)
Calcium: 9.1 mg/dL (ref 8.4–10.5)
GFR: 106.26 mL/min (ref >60.00)
Glucose, Bld: 85 mg/dL (ref 70–99)
POTASSIUM: 3.8 meq/L (ref 3.5–5.1)
Sodium: 136 mEq/L (ref 135–145)

## 2013-12-11 NOTE — Patient Instructions (Signed)
Your physician recommends that you continue on your current medications as directed. Please refer to the Current Medication list given to you today.  Your physician recommends that you return for lab work in:  TODAY- BMET   Your physician wants you to follow-up in: 6 MONTHS WITH DR Eldridge Dace You will receive a reminder letter in the mail two months in advance. If you don't receive a letter, please call our office to schedule the follow-up appointment.

## 2013-12-11 NOTE — Progress Notes (Signed)
Patient ID: Matthew [name], male   DOB: 1940-08-24, 73 y.o.   MRN: [id number]     [address] [place], [place]  [address] Phone: [phone] Fax:  [phone]  Date:  12/11/2013   ID:  Matthew [name], DOB 1940/09/03, MRN [id number]  PCP:  Billee Cashing, MD      History of Present Illness: Matthew [name] is a 73 y.o. male who has had issues with HTN and obesity. He lost some weight.  Kept off the weight he has lost over the last few years. He has been walking some too. No problems with that. No chest pain. He has avoided fast food places. BP is checked at drug store. Readings are in the 120-130s. Hypertension:  Denies : Chest pain.  Dizziness.  Leg edema.  Paroxysmal nocturnal dyspnea.  Palpitations.  Shortness of breath.  Dyspnea.  Syncope.     Wt Readings from Last 3 Encounters:  12/11/13 275 lb (124.739 kg)  07/09/13 263 lb (119.296 kg)  01/07/13 277 lb (125.646 kg)     Past Medical History  Diagnosis Date  . Obesity   . HTN (hypertension)   . Osteoarthritis   . Cardiomyopathy   . Heart failure   . CAD (coronary artery disease)     Current Outpatient Prescriptions  Medication Sig Dispense Refill  . amLODipine (NORVASC) 10 MG tablet take 1 tablet by mouth once daily  30 tablet  6  . aspirin 81 MG tablet Take 81 mg by mouth daily.      Marland Kitchen atorvastatin (LIPITOR) 10 MG tablet Take 1 tablet (10 mg total) by mouth daily.  30 tablet  6  . carvedilol (COREG) 25 MG tablet take 1 tablet by mouth twice a day with food  60 tablet  6  . ramipril (ALTACE) 10 MG capsule take 1 capsule by mouth twice a day  60 capsule  6  . spironolactone (ALDACTONE) 25 MG tablet take 1 tablet by mouth once daily  30 tablet  6  . traMADol (ULTRAM) 50 MG tablet Take by mouth every 6 (six) hours as needed.       No current facility-administered medications for this visit.    Allergies:   No Known Allergies  Social History:  The patient  reports that he has never  smoked. He does not have any smokeless tobacco history on file. He reports that he does not drink alcohol or use illicit drugs.   Family History:  The patient's family history includes Diabetes in his brother; Hypertension in his father. There is no history of Heart attack or Stroke.   ROS:  Please see the history of present illness.  No nausea, vomiting.  No fevers, chills.  No focal weakness.  No dysuria.    All other systems reviewed and negative.   PHYSICAL EXAM: VS:  BP 130/70  Pulse 72  Ht 6\' 7"  (2.007 m)  Wt 275 lb (124.739 kg)  BMI 30.97 kg/m2  SpO2 99% Well nourished, well developed, in no acute distress HEENT: normal Neck: no JVD, no carotid bruits Cardiac:  normal S1, S2; RRR;  Lungs:  clear to auscultation bilaterally, no wheezing, rhonchi or rales Abd: soft, nontender, no hepatomegaly Ext: mild bilateral edema Skin: warm and dry Neuro:   no focal abnormalities noted  EKG:  NSR, RBBB, no ST segment changes   , PVCs, no change  ASSESSMENT AND PLAN:  Tolerated recent knee surgery.  Other primary cardiomyopathies  IMAGING:  EC Echocardiogram 6/14 :  EF normalized.     Notes: Last EF 47% in 2009. No symptoms of CHF.  EF 50-55% in 5/14. 2. Essential hypertension, benign  Continue Carvedilol Tablet, 25 MG, 1 tablet with food, Orally, BID, 180, Refills 3 Continue Ramipril Tablet, 10 MG, 1 capsule, Orally, Twice a day, 180, Refills 3 Continue Amlodipine Besylate Tablet, 10 MG, 1 tablet, Orally, Once a day, 90, Refills 3 Continue Spironolactone Tablet, 25 MG, 1 tablet, Orally, Daily, 90 days, 90, Refills 3 Notes: Controlled at home.  3. Morbid obesity  Notes: He has kept 85 lbs off. He is trying to lose another 15 lbs. Ultimately, he is trying to get to 250 lbs.  4. Congestive heart failure, unspecified  Notes: CHF in the past. Resolved over the past few years. Check BMet since he is on Aldactone.  5. Coronary atherosclerosis of native coronary artery  Notes: Diagonal  disease on cath in 2001. No sx form CAD. 3/14: LDL 60, HDL 46, TG 46.   Signed, Fredric MareJay S. Varanasi, MD, [facility] 12/11/2013 10:11 AM

## 2013-12-29 DIAGNOSIS — M170 Bilateral primary osteoarthritis of knee: Secondary | ICD-10-CM | POA: Diagnosis not present

## 2014-02-18 ENCOUNTER — Other Ambulatory Visit: Payer: Self-pay

## 2014-02-18 MED ORDER — AMLODIPINE BESYLATE 10 MG PO TABS: ORAL_TABLET | ORAL | Status: DC

## 2014-02-18 MED ORDER — SPIRONOLACTONE 25 MG PO TABS: ORAL_TABLET | ORAL | Status: DC

## 2014-02-18 MED ORDER — CARVEDILOL 25 MG PO TABS: ORAL_TABLET | ORAL | Status: DC

## 2014-03-19 ENCOUNTER — Other Ambulatory Visit: Payer: Self-pay | Admitting: *Deleted

## 2014-03-19 ENCOUNTER — Other Ambulatory Visit: Payer: Self-pay

## 2014-03-19 MED ORDER — SPIRONOLACTONE 25 MG PO TABS: ORAL_TABLET | ORAL | Status: DC

## 2014-03-19 MED ORDER — ATORVASTATIN CALCIUM 10 MG PO TABS: 10.0000 mg | ORAL_TABLET | Freq: Every day | ORAL | Status: DC

## 2014-03-23 ENCOUNTER — Other Ambulatory Visit: Payer: Self-pay

## 2014-03-23 MED ORDER — SPIRONOLACTONE 25 MG PO TABS: ORAL_TABLET | ORAL | Status: DC

## 2014-03-23 MED ORDER — CARVEDILOL 25 MG PO TABS: ORAL_TABLET | ORAL | Status: DC

## 2014-04-21 ENCOUNTER — Other Ambulatory Visit: Payer: Self-pay

## 2014-04-21 MED ORDER — AMLODIPINE BESYLATE 10 MG PO TABS: ORAL_TABLET | ORAL | Status: DC

## 2014-04-23 DIAGNOSIS — M1712 Unilateral primary osteoarthritis, left knee: Secondary | ICD-10-CM | POA: Diagnosis not present

## 2014-04-23 DIAGNOSIS — M12812 Other specific arthropathies, not elsewhere classified, left shoulder: Secondary | ICD-10-CM | POA: Diagnosis not present

## 2014-05-11 DIAGNOSIS — M19011 Primary osteoarthritis, right shoulder: Secondary | ICD-10-CM | POA: Diagnosis not present

## 2014-05-11 DIAGNOSIS — M1711 Unilateral primary osteoarthritis, right knee: Secondary | ICD-10-CM | POA: Diagnosis not present

## 2014-05-18 ENCOUNTER — Encounter: Payer: Self-pay | Admitting: Interventional Cardiology

## 2014-05-18 ENCOUNTER — Ambulatory Visit (INDEPENDENT_AMBULATORY_CARE_PROVIDER_SITE_OTHER): Payer: Medicare Other | Admitting: Interventional Cardiology

## 2014-05-18 VITALS — BP 148/88 | HR 69 | Ht 79.0 in | Wt 290.8 lb

## 2014-05-18 DIAGNOSIS — E782 Mixed hyperlipidemia: Secondary | ICD-10-CM

## 2014-05-18 DIAGNOSIS — E669 Obesity, unspecified: Secondary | ICD-10-CM

## 2014-05-18 DIAGNOSIS — I10 Essential (primary) hypertension: Secondary | ICD-10-CM

## 2014-05-18 NOTE — Progress Notes (Signed)
Patient ID: Matthew [name], male   DOB: Jan 22, 1941, 74 y.o.   MRN: [id number]     [address] [place], [place]  [address] Phone: [phone] Fax:  [phone]  Date:  05/18/2014   ID:  Matthew [name], DOB 1940-07-23, MRN [id number]  PCP:  Billee Cashing, MD      History of Present Illness: Matthew [name] is a 74 y.o. male who has had issues with HTN and obesity. He lost some weight years ago.  Kept off the weight he has lost over the last few years, until the past 11 months. He has been walking daily, for 10-15 minutes/day. No problems with that. No chest pain. He has gone back to eating at fast food places. He has been eating ice cream more frequently as well.    BP is checked at drug store. Readings are in the 130s.  Hypertension:  Denies : Chest pain.  Dizziness.  Leg edema.  Paroxysmal nocturnal dyspnea.  Palpitations.  Shortness of breath.  Dyspnea.  Syncope.   Weight gain, which is disappointing to him.   Wt Readings from Last 3 Encounters:  05/18/14 290 lb 12.8 oz (131.906 kg)  12/11/13 275 lb (124.739 kg)  07/09/13 263 lb (119.296 kg)     Past Medical History  Diagnosis Date  . Obesity   . HTN (hypertension)   . Osteoarthritis   . Cardiomyopathy   . Heart failure   . CAD (coronary artery disease)     Current Outpatient Prescriptions  Medication Sig Dispense Refill  . amLODipine (NORVASC) 10 MG tablet take 1 tablet by mouth once daily 30 tablet 1  . aspirin 81 MG tablet Take 81 mg by mouth daily.    Marland Kitchen atorvastatin (LIPITOR) 10 MG tablet Take 1 tablet (10 mg total) by mouth daily. 30 tablet 6  . carvedilol (COREG) 25 MG tablet take 1 tablet by mouth twice a day with food 60 tablet 1  . ramipril (ALTACE) 10 MG capsule take 1 capsule by mouth twice a day 60 capsule 6  . spironolactone (ALDACTONE) 25 MG tablet take 1 tablet by mouth once daily 30 tablet 1  . traMADol (ULTRAM) 50 MG tablet Take by mouth every 6 (six) hours as needed.      No current facility-administered medications for this visit.    Allergies:   No Known Allergies  Social History:  The patient  reports that he has never smoked. He does not have any smokeless tobacco history on file. He reports that he does not drink alcohol or use illicit drugs.   Family History:  The patient's family history includes Diabetes in his brother; Hypertension in his father. There is no history of Heart attack or Stroke.   ROS:  Please see the history of present illness.  No nausea, vomiting.  No fevers, chills.  No focal weakness.  No dysuria.    All other systems reviewed and negative.   PHYSICAL EXAM: VS:  BP 148/88 mmHg  Pulse 69  Ht 6\' 7"  (2.007 m)  Wt 290 lb 12.8 oz (131.906 kg)  BMI 32.75 kg/m2 Well nourished, well developed, in no acute distress HEENT: normal Neck: no JVD, no carotid bruits Cardiac:  normal S1, S2; RRR;  Lungs:  clear to auscultation bilaterally, no wheezing, rhonchi or rales Abd: soft, nontender, no hepatomegaly Ext: mild bilateral edema Skin: warm and dry Neuro:   no focal abnormalities noted Psych: normal affect  EKG:  NSR, RBBB,  no ST segment changes ,   ASSESSMENT AND PLAN:  Tolerated recent knee surgery.  Other primary cardiomyopathies  IMAGING: EC Echocardiogram 2014 :  EF normalized.  No sx of CHF.  He has gained weight but this has not resulted in more sx.     Notes: Last EF 47% in 2009. No symptoms of CHF.  EF 50-55% in 5/14. 2. Essential hypertension, benign  Continue Carvedilol Tablet, 25 MG, 1 tablet with food, Orally, BID, 180, Refills 3 Continue Ramipril Tablet, 10 MG, 1 capsule, Orally, Twice a day, 180, Refills 3 Continue Amlodipine Besylate Tablet, 10 MG, 1 tablet, Orally, Once a day, 90, Refills 3 Continue Spironolactone Tablet, 25 MG, 1 tablet, Orally, Daily, 90 days, 90, Refills 3 Notes: Controlled at home.  Typically 130s systolic at home. 3. Morbid obesity  Notes: He had lost 85 lbs. He was trying to lose  another 15 lbs, ultimately,  trying to get to 250 lbs.   However, in the last 11 months, he has gained 27 lbs.  He will go back to eating more salad.  He had reverted to fast food and knows what it takes to lose the weight.  Would try to get him to 265 lbs in the next six months. 4. Congestive heart failure, unspecified  Notes: CHF in the past. Resolved over the past few years. Check BMet since he is on Aldactone.  5. Coronary atherosclerosis of native coronary artery  Notes: Diagonal disease on cath in 2001. No sx form CAD. 3/14: LDL 60, HDL 46, TG 46.  Check lipids at next blood check.    Signed, Fredric Mare, MD, [facility] 05/18/2014 9:51 AM

## 2014-05-18 NOTE — Patient Instructions (Signed)
Your physician recommends that you continue on your current medications as directed. Please refer to the Current Medication list given to you today.  Your physician recommends that you return for lab work when fasting (CMET, Lipids)  Your physician wants you to follow-up in: 6 months with Dr. Eldridge Dace. You will receive a reminder letter in the mail two months in advance. If you don't receive a letter, please call our office to schedule the follow-up appointment.

## 2014-05-20 ENCOUNTER — Other Ambulatory Visit: Payer: Self-pay | Admitting: *Deleted

## 2014-05-20 MED ORDER — CARVEDILOL 25 MG PO TABS: ORAL_TABLET | ORAL | Status: DC

## 2014-05-20 MED ORDER — SPIRONOLACTONE 25 MG PO TABS: ORAL_TABLET | ORAL | Status: DC

## 2014-05-25 ENCOUNTER — Other Ambulatory Visit (INDEPENDENT_AMBULATORY_CARE_PROVIDER_SITE_OTHER): Payer: Medicare Other | Admitting: *Deleted

## 2014-05-25 DIAGNOSIS — I10 Essential (primary) hypertension: Secondary | ICD-10-CM | POA: Diagnosis not present

## 2014-05-25 DIAGNOSIS — E782 Mixed hyperlipidemia: Secondary | ICD-10-CM

## 2014-05-25 LAB — COMPREHENSIVE METABOLIC PANEL
ALBUMIN: 3.5 g/dL (ref 3.5–5.2)
ALT: 10 U/L (ref 0–53)
AST: 14 U/L (ref 0–37)
Alkaline Phosphatase: 93 U/L (ref 39–117)
BUN: 13 mg/dL (ref 6–23)
CO2: 28 mEq/L (ref 19–32)
CREATININE: 0.87 mg/dL (ref 0.40–1.50)
Calcium: 9.4 mg/dL (ref 8.4–10.5)
Chloride: 103 mEq/L (ref 96–112)
GFR: 110.37 mL/min (ref >60.00)
GLUCOSE: 80 mg/dL (ref 70–99)
Potassium: 4.3 mEq/L (ref 3.5–5.1)
SODIUM: 136 meq/L (ref 135–145)
Total Bilirubin: 0.6 mg/dL (ref 0.2–1.2)
Total Protein: 7.5 g/dL (ref 6.0–8.3)

## 2014-05-25 LAB — LIPID PANEL
CHOLESTEROL: 123 mg/dL (ref 0–200)
HDL: 40.5 mg/dL (ref >39.00)
LDL CALC: 73 mg/dL (ref 0–99)
NonHDL: 82.5
Total CHOL/HDL Ratio: 3
Triglycerides: 50 mg/dL (ref 0.0–149.0)
VLDL: 10 mg/dL (ref 0.0–40.0)

## 2014-07-15 ENCOUNTER — Other Ambulatory Visit: Payer: Self-pay

## 2014-07-15 MED ORDER — RAMIPRIL 10 MG PO CAPS: ORAL_CAPSULE | ORAL | Status: DC

## 2014-07-15 MED ORDER — AMLODIPINE BESYLATE 10 MG PO TABS: ORAL_TABLET | ORAL | Status: DC

## 2014-08-27 DIAGNOSIS — M170 Bilateral primary osteoarthritis of knee: Secondary | ICD-10-CM | POA: Diagnosis not present

## 2014-08-27 DIAGNOSIS — M75122 Complete rotator cuff tear or rupture of left shoulder, not specified as traumatic: Secondary | ICD-10-CM | POA: Diagnosis not present

## 2014-08-27 DIAGNOSIS — M19212 Secondary osteoarthritis, left shoulder: Secondary | ICD-10-CM | POA: Diagnosis not present

## 2014-09-08 ENCOUNTER — Other Ambulatory Visit: Payer: Self-pay

## 2014-09-08 MED ORDER — ATORVASTATIN CALCIUM 10 MG PO TABS: 10.0000 mg | ORAL_TABLET | Freq: Every day | ORAL | Status: DC

## 2014-09-24 DIAGNOSIS — M19012 Primary osteoarthritis, left shoulder: Secondary | ICD-10-CM | POA: Diagnosis not present

## 2014-09-24 DIAGNOSIS — M1712 Unilateral primary osteoarthritis, left knee: Secondary | ICD-10-CM | POA: Diagnosis not present

## 2014-09-24 DIAGNOSIS — M1711 Unilateral primary osteoarthritis, right knee: Secondary | ICD-10-CM | POA: Diagnosis not present

## 2014-09-24 DIAGNOSIS — M75122 Complete rotator cuff tear or rupture of left shoulder, not specified as traumatic: Secondary | ICD-10-CM | POA: Diagnosis not present

## 2014-10-01 ENCOUNTER — Encounter: Payer: Self-pay | Admitting: Interventional Cardiology

## 2014-11-23 DIAGNOSIS — Z23 Encounter for immunization: Secondary | ICD-10-CM | POA: Diagnosis not present

## 2014-11-30 ENCOUNTER — Other Ambulatory Visit: Payer: Self-pay | Admitting: Interventional Cardiology

## 2014-11-30 MED ORDER — AMLODIPINE BESYLATE 10 MG PO TABS: ORAL_TABLET | ORAL | Status: DC

## 2014-11-30 MED ORDER — CARVEDILOL 25 MG PO TABS: ORAL_TABLET | ORAL | Status: DC

## 2014-11-30 MED ORDER — RAMIPRIL 10 MG PO CAPS: ORAL_CAPSULE | ORAL | Status: DC

## 2014-12-11 ENCOUNTER — Ambulatory Visit: Payer: Medicare Other | Admitting: Interventional Cardiology

## 2014-12-11 ENCOUNTER — Ambulatory Visit (INDEPENDENT_AMBULATORY_CARE_PROVIDER_SITE_OTHER): Payer: Medicare Other | Admitting: Interventional Cardiology

## 2014-12-11 ENCOUNTER — Encounter: Payer: Self-pay | Admitting: Interventional Cardiology

## 2014-12-11 VITALS — BP 130/82 | HR 77 | Ht 79.0 in | Wt 291.0 lb

## 2014-12-11 DIAGNOSIS — E669 Obesity, unspecified: Secondary | ICD-10-CM

## 2014-12-11 DIAGNOSIS — I10 Essential (primary) hypertension: Secondary | ICD-10-CM | POA: Diagnosis not present

## 2014-12-11 DIAGNOSIS — I429 Cardiomyopathy, unspecified: Secondary | ICD-10-CM

## 2014-12-11 DIAGNOSIS — E782 Mixed hyperlipidemia: Secondary | ICD-10-CM

## 2014-12-11 DIAGNOSIS — I2510 Atherosclerotic heart disease of native coronary artery without angina pectoris: Secondary | ICD-10-CM

## 2014-12-11 NOTE — Patient Instructions (Signed)
Medication Instructions:  None  Labwork: Your physician recommends that you return for lab work at your next appointment (CMET and Lipids)   Testing/Procedures: None  Follow-Up: Your physician wants you to follow-up in: 6 months with Dr. Eldridge Dace. You will receive a reminder letter in the mail two months in advance. If you don't receive a letter, please call our office to schedule the follow-up appointment.   Any Other Special Instructions Will Be Listed Below (If Applicable).     If you need a refill on your cardiac medications before your next appointment, please call your pharmacy.

## 2014-12-11 NOTE — Progress Notes (Signed)
Patient ID: Matthew [name], male   DOB: [birth year], 74 y.o.   MRN: [id number]     Cardiology Office Note   Date:  12/11/2014   ID:  Matthew [name], DOB 10-22-1940, MRN [id number]  PCP:  Matthew Cashing, MD    No chief complaint on file. f/u RF for CAD   Wt Readings from Last 3 Encounters:  12/11/14 291 lb (131.997 kg)  05/18/14 290 lb 12.8 oz (131.906 kg)  12/11/13 275 lb (124.739 kg)       History of Present Illness: Matthew [name] is a 74 y.o. male  who has had issues with HTN and obesity. He lost some weight years ago. Kept off the weight he has lost over the last few years, until the past 11 months. He has been walking daily, for 10-15 minutes/day. No problems with that. No chest pain. He has cut back on eating at fast food places. He has not gained any more weight since his last visit.   BP is checked at drug store. Readings are in the 130s.  Hypertension:  Denies : Chest pain.  Dizziness.  Leg edema.  Paroxysmal nocturnal dyspnea.  Palpitations.  Shortness of breath.  Dyspnea.  Syncope.   No weight loss, which is disappointing to him.   Walking regularly these days as well.  Highest weight was around 340 lbs several years ago.    Past Medical History  Diagnosis Date  . Obesity   . HTN (hypertension)   . Osteoarthritis   . Cardiomyopathy (HCC)   . Heart failure (HCC)   . CAD (coronary artery disease)     Past Surgical History  Procedure Laterality Date  . Knee surgery       Current Outpatient Prescriptions  Medication Sig Dispense Refill  . amLODipine (NORVASC) 10 MG tablet take 1 tablet by mouth once daily 30 tablet 4  . aspirin 81 MG tablet Take 81 mg by mouth daily.    Marland Kitchen atorvastatin (LIPITOR) 10 MG tablet Take 1 tablet (10 mg total) by mouth daily. 30 tablet 6  . carvedilol (COREG) 25 MG tablet take 1 tablet by mouth twice a day with food 60 tablet 5  . ramipril (ALTACE) 10 MG capsule take 1 capsule by mouth twice a day 60 capsule 4    . spironolactone (ALDACTONE) 25 MG tablet take 1 tablet by mouth once daily 30 tablet 5  . traMADol (ULTRAM) 50 MG tablet Take by mouth every 6 (six) hours as needed (PAIN).      No current facility-administered medications for this visit.    Allergies:   Review of patient's allergies indicates no known allergies.    Social History:  The patient  reports that he has never smoked. He does not have any smokeless tobacco history on file. He reports that he does not drink alcohol or use illicit drugs.   Family History:  The patient's family history includes Diabetes in his brother; Hypertension in his father. There is no history of Heart attack or Stroke.    ROS:  Please see the history of present illness.   Otherwise, review of systems are positive for weigh staying stable; difficulty losing weight.   All other systems are reviewed and negative.    PHYSICAL EXAM: VS:  BP 130/82 mmHg  Pulse 77  Ht  (2.007 m)  Wt 291 lb (131.997 kg)  BMI 32.77 kg/m2  SpO2 99% , BMI Body mass index is 32.77 kg/(m^2). GEN: Well nourished, well  developed, in no acute distress HEENT: normal Neck: no JVD, carotid bruits, or masses Cardiac: RRR; no murmurs, rubs, or gallops, mild ankle edema  Respiratory:  clear to auscultation bilaterally, normal work of breathing GI: soft, nontender, nondistended, + BS MS: no deformity or atrophy Skin: warm and dry, no rash Neuro:  Strength and sensation are intact Psych: euthymic mood, full affect     Recent Labs: 05/25/2014: ALT 10; BUN 13; Creatinine, Ser 0.87; Potassium 4.3; Sodium 136   Lipid Panel    Component Value Date/Time   CHOL 123 05/25/2014 1102   TRIG 50.0 05/25/2014 1102   HDL 40.50 05/25/2014 1102   CHOLHDL 3 05/25/2014 1102   VLDL 10.0 05/25/2014 1102   LDLCALC 73 05/25/2014 1102     Other studies Reviewed: Additional studies/ records that were reviewed today with results demonstrating: 2014 echo .   ASSESSMENT AND  PLAN:  1. Prior cardiomyopathy: EF normal by echo in 5/14.   2. Obesity: I have set a target of 15 lbs weight loss before his next visit.  3. CAD: Branch vessel disease noted in 2001.  No angina.  Continue preventive therapy.  4. HTN: COntrolled. Check labs in 6 months.    Current medicines are reviewed at length with the patient today.  The patient concerns regarding his medicines were addressed.  The following changes have been made:  No change  Labs/ tests ordered today include:  No orders of the defined types were placed in this encounter.    Recommend 150 minutes/week of aerobic exercise Low fat, low carb, high fiber diet recommended  Disposition:   FU in 6 months   Delorise Jackson., MD  12/11/2014 9:57 AM    [facility] [address] [place], [place], [place]  [address] Phone: [phone]; Fax: [phone]

## 2015-01-14 DIAGNOSIS — M1712 Unilateral primary osteoarthritis, left knee: Secondary | ICD-10-CM | POA: Diagnosis not present

## 2015-01-14 DIAGNOSIS — M1711 Unilateral primary osteoarthritis, right knee: Secondary | ICD-10-CM | POA: Diagnosis not present

## 2015-01-22 ENCOUNTER — Other Ambulatory Visit: Payer: Self-pay | Admitting: *Deleted

## 2015-01-22 MED ORDER — SPIRONOLACTONE 25 MG PO TABS: ORAL_TABLET | ORAL | Status: DC

## 2015-01-26 DIAGNOSIS — M19011 Primary osteoarthritis, right shoulder: Secondary | ICD-10-CM | POA: Diagnosis not present

## 2015-01-26 DIAGNOSIS — M19012 Primary osteoarthritis, left shoulder: Secondary | ICD-10-CM | POA: Diagnosis not present

## 2015-04-27 DIAGNOSIS — M25561 Pain in right knee: Secondary | ICD-10-CM | POA: Diagnosis not present

## 2015-04-27 DIAGNOSIS — M19011 Primary osteoarthritis, right shoulder: Secondary | ICD-10-CM | POA: Diagnosis not present

## 2015-04-27 DIAGNOSIS — M25562 Pain in left knee: Secondary | ICD-10-CM | POA: Diagnosis not present

## 2015-05-03 ENCOUNTER — Other Ambulatory Visit: Payer: Self-pay | Admitting: *Deleted

## 2015-05-03 MED ORDER — ATORVASTATIN CALCIUM 10 MG PO TABS: 10.0000 mg | ORAL_TABLET | Freq: Every day | ORAL | Status: DC

## 2015-05-03 MED ORDER — AMLODIPINE BESYLATE 10 MG PO TABS: ORAL_TABLET | ORAL | Status: DC

## 2015-05-05 ENCOUNTER — Other Ambulatory Visit: Payer: Self-pay | Admitting: Interventional Cardiology

## 2015-05-11 DIAGNOSIS — M67912 Unspecified disorder of synovium and tendon, left shoulder: Secondary | ICD-10-CM | POA: Diagnosis not present

## 2015-05-11 DIAGNOSIS — M67911 Unspecified disorder of synovium and tendon, right shoulder: Secondary | ICD-10-CM | POA: Diagnosis not present

## 2015-06-01 ENCOUNTER — Other Ambulatory Visit: Payer: Self-pay | Admitting: Interventional Cardiology

## 2015-06-10 DIAGNOSIS — M25561 Pain in right knee: Secondary | ICD-10-CM | POA: Diagnosis not present

## 2015-06-10 DIAGNOSIS — M67912 Unspecified disorder of synovium and tendon, left shoulder: Secondary | ICD-10-CM | POA: Diagnosis not present

## 2015-06-10 DIAGNOSIS — M67911 Unspecified disorder of synovium and tendon, right shoulder: Secondary | ICD-10-CM | POA: Diagnosis not present

## 2015-06-10 DIAGNOSIS — M25562 Pain in left knee: Secondary | ICD-10-CM | POA: Diagnosis not present

## 2015-06-11 ENCOUNTER — Encounter: Payer: Self-pay | Admitting: Interventional Cardiology

## 2015-06-11 ENCOUNTER — Ambulatory Visit (INDEPENDENT_AMBULATORY_CARE_PROVIDER_SITE_OTHER): Payer: Medicare Other | Admitting: Interventional Cardiology

## 2015-06-11 ENCOUNTER — Other Ambulatory Visit (INDEPENDENT_AMBULATORY_CARE_PROVIDER_SITE_OTHER): Payer: Medicare Other | Admitting: *Deleted

## 2015-06-11 VITALS — BP 140/61 | HR 52 | Ht 79.0 in | Wt 298.8 lb

## 2015-06-11 DIAGNOSIS — I499 Cardiac arrhythmia, unspecified: Secondary | ICD-10-CM

## 2015-06-11 DIAGNOSIS — I429 Cardiomyopathy, unspecified: Secondary | ICD-10-CM

## 2015-06-11 DIAGNOSIS — I10 Essential (primary) hypertension: Secondary | ICD-10-CM

## 2015-06-11 DIAGNOSIS — E782 Mixed hyperlipidemia: Secondary | ICD-10-CM

## 2015-06-11 DIAGNOSIS — I498 Other specified cardiac arrhythmias: Secondary | ICD-10-CM | POA: Insufficient documentation

## 2015-06-11 LAB — COMPREHENSIVE METABOLIC PANEL
ALBUMIN: 3.9 g/dL (ref 3.6–5.1)
ALK PHOS: 114 U/L (ref 40–115)
ALT: 9 U/L (ref 9–46)
AST: 13 U/L (ref 10–35)
BILIRUBIN TOTAL: 0.5 mg/dL (ref 0.2–1.2)
BUN: 12 mg/dL (ref 7–25)
CALCIUM: 9 mg/dL (ref 8.6–10.3)
CHLORIDE: 107 mmol/L (ref 98–110)
CO2: 20 mmol/L (ref 20–31)
CREATININE: 0.93 mg/dL (ref 0.70–1.18)
GLUCOSE: 79 mg/dL (ref 65–99)
Potassium: 4.2 mmol/L (ref 3.5–5.3)
SODIUM: 140 mmol/L (ref 135–146)
Total Protein: 6.9 g/dL (ref 6.1–8.1)

## 2015-06-11 LAB — LIPID PANEL
CHOLESTEROL: 110 mg/dL — AB (ref 125–200)
HDL: 34 mg/dL — ABNORMAL LOW (ref >=40)
LDL Cholesterol: 62 mg/dL (ref <130)
Total CHOL/HDL Ratio: 3.2 Ratio (ref <=5.0)
Triglycerides: 68 mg/dL (ref <150)
VLDL: 14 mg/dL (ref <30)

## 2015-06-11 NOTE — Progress Notes (Signed)
Patient ID: Matthew [name], male   DOB: [birth year], 75 y.o.   MRN: [id number]     Cardiology Office Note   Date:  06/11/2015   ID:  Matthew [name], DOB [birth year], MRN [id number]  PCP:  Billee Cashing, MD    No chief complaint on file. f/u RF for CAD   Wt Readings from Last 3 Encounters:  06/11/15 298 lb 12.8 oz (135.535 kg)  12/11/14 291 lb (131.997 kg)  05/18/14 290 lb 12.8 oz (131.906 kg)       History of Present Illness: Matthew [name] is a 75 y.o. male  who has had issues with HTN and obesity. He lost some weight years ago.  He has been as high as 345 lbs. He has gained a little weight since the last visit.  He has normal LVEF in 2014.  Mildly dilated aortic root at that time. No chest pain ro SHOB.  No palpitations.    BP checks at pharmacy are controlled.     Past Medical History  Diagnosis Date  . Obesity   . HTN (hypertension)   . Osteoarthritis   . Cardiomyopathy (HCC)   . Heart failure (HCC)   . CAD (coronary artery disease)     Past Surgical History  Procedure Laterality Date  . Knee surgery       Current Outpatient Prescriptions  Medication Sig Dispense Refill  . amLODipine (NORVASC) 10 MG tablet take 1 tablet by mouth once daily 30 tablet 5  . aspirin 81 MG tablet Take 81 mg by mouth daily.    Marland Kitchen atorvastatin (LIPITOR) 10 MG tablet Take 1 tablet (10 mg total) by mouth daily. 30 tablet 5  . carvedilol (COREG) 25 MG tablet Take 1 tablet (25 mg total) by mouth 2 (two) times daily with a meal. 60 tablet 11  . ramipril (ALTACE) 10 MG capsule take 1 capsule by mouth twice a day 60 capsule 5  . spironolactone (ALDACTONE) 25 MG tablet take 1 tablet by mouth once daily 30 tablet 9  . traMADol (ULTRAM) 50 MG tablet Take by mouth every 6 (six) hours as needed (PAIN).      No current facility-administered medications for this visit.    Allergies:   Review of patient's allergies indicates no known allergies.    Social History:  The patient  reports  that he has never smoked. He does not have any smokeless tobacco history on file. He reports that he does not drink alcohol or use illicit drugs.   Family History:  The patient's family history includes Diabetes in his brother; Hypertension in his father. There is no history of Heart attack or Stroke.    ROS:  Please see the history of present illness.   Otherwise, review of systems are positive for weight gain.   All other systems are reviewed and negative.    PHYSICAL EXAM: VS:  BP 140/61 mmHg  Pulse 52  Ht  (2.007 m)  Wt 298 lb 12.8 oz (135.535 kg)  BMI 33.65 kg/m2 , BMI Body mass index is 33.65 kg/(m^2). GEN: Well nourished, well developed, in no acute distress HEENT: normal Neck: no JVD, carotid bruits, or masses Cardiac: regular rate- every other beat premature; no murmurs, rubs, or gallops,no edema  Respiratory:  clear to auscultation bilaterally, normal work of breathing GI: soft, nontender, nondistended, + BS MS: no deformity or atrophy Skin: warm and dry, no rash Neuro:  Strength and sensation are intact, slow gait with Macrae Psych:  euthymic mood, full affect   EKG:   The ekg ordered today demonstrates NSR, bigeminy   Recent Labs: No results found for requested labs within last 365 days.   Lipid Panel    Component Value Date/Time   CHOL 123 05/25/2014 1102   TRIG 50.0 05/25/2014 1102   HDL 40.50 05/25/2014 1102   CHOLHDL 3 05/25/2014 1102   VLDL 10.0 05/25/2014 1102   LDLCALC 73 05/25/2014 1102     Other studies Reviewed: Additional studies/ records that were reviewed today with results demonstrating: 2014 echo.   ASSESSMENT AND PLAN:  1. Prior cardiomyopathy: EF normal by echo in 5/14. No CHF sx at this time. 2. Obesity: I have set a target of 15 lbs weight loss before his next visit. He gained weight since the last visit.  I asked him to cut down on his mayonaise usage. 3. CAD: Branch vessel disease noted in 2001. No angina. Continue  preventive therapy.  4. HTN: Controlled. Check labs in 6 months. 5. Bigeminy today.  Will have to recheck rhythm at next visit.  6. Hyperlipidemia: Checked today. COntinue current meds.    Current medicines are reviewed at length with the patient today.  The patient concerns regarding his medicines were addressed.  The following changes have been made:  No change   Labs/ tests ordered today include:  Orders Placed This Encounter  Procedures  . EKG 12-Lead    Recommend 150 minutes/week of aerobic exercise Low fat, low carb, high fiber diet recommended  Disposition:   FU in 6 months   Delorise Jackson., MD  06/11/2015 9:27 AM    [facility] [address] [place], [place], [place]  [address] Phone: [phone]; Fax: [phone]

## 2015-06-11 NOTE — Patient Instructions (Signed)
**Note De-identified Matthew [name] Obfuscation** Medication Instructions:  Same-no changes  Labwork: None  Testing/Procedures: None  Follow-Up: Your physician wants you to follow-up in: 6 months. You will receive a reminder letter in the mail two months in advance. If you don't receive a letter, please call our office to schedule the follow-up appointment.      If you need a refill on your cardiac medications before your next appointment, please call your pharmacy.   

## 2015-06-11 NOTE — Addendum Note (Signed)
Addended by: Tonita Phoenix on: 06/11/2015 08:52 AM   Modules accepted: Orders

## 2015-06-16 ENCOUNTER — Telehealth: Payer: Self-pay | Admitting: Interventional Cardiology

## 2015-06-16 NOTE — Telephone Encounter (Signed)
New Message  Pt requested to speak w/ RN- discuss lab results. Please call back and discuss.

## 2015-06-16 NOTE — Telephone Encounter (Signed)
**Note De-identified Matthew [name] Obfuscation** LMTCB

## 2015-06-17 NOTE — Telephone Encounter (Signed)
**Note De-identified Matthew [name] Obfuscation** The pt has been given his lab results and he verbalized understanding. 

## 2015-06-17 NOTE — Telephone Encounter (Signed)
F/u  Pt returning Rn phone call. Please call back and discuss.   

## 2015-08-09 DIAGNOSIS — M25562 Pain in left knee: Secondary | ICD-10-CM | POA: Diagnosis not present

## 2015-08-09 DIAGNOSIS — M25561 Pain in right knee: Secondary | ICD-10-CM | POA: Diagnosis not present

## 2015-08-09 DIAGNOSIS — M1711 Unilateral primary osteoarthritis, right knee: Secondary | ICD-10-CM | POA: Diagnosis not present

## 2015-08-09 DIAGNOSIS — M1712 Unilateral primary osteoarthritis, left knee: Secondary | ICD-10-CM | POA: Diagnosis not present

## 2015-08-26 DIAGNOSIS — M19011 Primary osteoarthritis, right shoulder: Secondary | ICD-10-CM | POA: Diagnosis not present

## 2015-08-26 DIAGNOSIS — M19012 Primary osteoarthritis, left shoulder: Secondary | ICD-10-CM | POA: Diagnosis not present

## 2015-08-26 DIAGNOSIS — M67912 Unspecified disorder of synovium and tendon, left shoulder: Secondary | ICD-10-CM | POA: Diagnosis not present

## 2015-08-26 DIAGNOSIS — M67911 Unspecified disorder of synovium and tendon, right shoulder: Secondary | ICD-10-CM | POA: Diagnosis not present

## 2015-11-05 ENCOUNTER — Other Ambulatory Visit: Payer: Self-pay | Admitting: Interventional Cardiology

## 2015-11-09 DIAGNOSIS — Z23 Encounter for immunization: Secondary | ICD-10-CM | POA: Diagnosis not present

## 2015-12-04 ENCOUNTER — Other Ambulatory Visit: Payer: Self-pay | Admitting: Interventional Cardiology

## 2015-12-07 NOTE — Progress Notes (Signed)
Patient ID: Matthew [name], male   DOB: 02/02/1941, 75 y.o.   MRN: [id number]     Cardiology Office Note   Date:  12/08/2015   ID:  Matthew [name], DOB 10-12-1940, MRN [id number]  PCP:  Billee Cashing, MD    No chief complaint on file. f/u RF for CAD   Wt Readings from Last 3 Encounters:  12/08/15 133.1 kg (293 lb 6.4 oz)  06/11/15 135.5 kg (298 lb 12.8 oz)  12/11/14 132 kg (291 lb)       History of Present Illness: Matthew [name] is a 75 y.o. male  who has had issues with HTN and obesity. He lost some weight years ago.  He has been as high as 345 lbs. He has again gained a little weight since the last visit.  He has normal LVEF in 2014.  Mildly dilated aortic root at that time. No chest pain ro SHOB.  No palpitations.    BP checks at pharmacy are controlled. Same range as today, around 130/60.  Walking is limited by orthopedic issues.  He has back issues and this limits his walking.    He is asking for medicine for erectile dysfunction.      Past Medical History:  Diagnosis Date  . CAD (coronary artery disease)   . Cardiomyopathy (HCC)   . Heart failure (HCC)   . HTN (hypertension)   . Obesity   . Osteoarthritis     Past Surgical History:  Procedure Laterality Date  . KNEE SURGERY       Current Outpatient Prescriptions  Medication Sig Dispense Refill  . amLODipine (NORVASC) 10 MG tablet take 1 tablet by mouth once daily 90 tablet 1  . aspirin 81 MG tablet Take 81 mg by mouth daily.    Marland Kitchen atorvastatin (LIPITOR) 10 MG tablet take 1 tablet by mouth once daily 90 tablet 1  . carvedilol (COREG) 25 MG tablet Take 1 tablet (25 mg total) by mouth 2 (two) times daily with a meal. 60 tablet 11  . ramipril (ALTACE) 10 MG capsule take 1 capsule by mouth twice a day 180 capsule 1  . spironolactone (ALDACTONE) 25 MG tablet Take 1 tablet (25 mg total) by mouth daily. 30 tablet 5  . traMADol (ULTRAM) 50 MG tablet Take by mouth every 6 (six) hours as needed (PAIN).       No current facility-administered medications for this visit.     Allergies:   Review of patient's allergies indicates no known allergies.    Social History:  The patient  reports that he has never smoked. He does not have any smokeless tobacco history on file. He reports that he does not drink alcohol or use drugs.   Family History:  The patient's family history includes Diabetes in his brother; Hypertension in his father.    ROS:  Please see the history of present illness.   Otherwise, review of systems are positive for weight gain.   All other systems are reviewed and negative.    PHYSICAL EXAM: VS:  BP 130/60   Pulse 60   Ht 6\' 7"  (2.007 m)   Wt 133.1 kg (293 lb 6.4 oz)   BMI 33.05 kg/m  , BMI Body mass index is 33.05 kg/m. GEN: Well nourished, well developed, in no acute distress  HEENT: normal  Neck: no JVD, carotid bruits, or masses Cardiac: regular rate-  Premature beats; no murmurs, rubs, or gallops; ,bilateral pretibial edema  Respiratory:  clear to auscultation  bilaterally, normal work of breathing GI: soft, nontender, nondistended, + BS MS: no deformity or atrophy  Skin: warm and dry, no rash Neuro:  Strength and sensation are intact, slow gait with Sayer Psych: euthymic mood, full affect     Recent Labs: 06/11/2015: ALT 9; BUN 12; Creat 0.93; Potassium 4.2; Sodium 140   Lipid Panel    Component Value Date/Time   CHOL 110 (L) 06/11/2015 0852   TRIG 68 06/11/2015 0852   HDL 34 (L) 06/11/2015 0852   CHOLHDL 3.2 06/11/2015 0852   VLDL 14 06/11/2015 0852   LDLCALC 62 06/11/2015 0852     Other studies Reviewed: Additional studies/ records that were reviewed today with results demonstrating: 2014 echo.   ASSESSMENT AND PLAN:  1. Prior cardiomyopathy: EF normal by echo in 5/14. No CHF sx at this time.  He asked about coming off of the medicine.  There would be a risk of worsening LV function if he stopped the medicine so he is willing to take it.   2. Obesity: I have set a target of 10 lbs weight loss before his next visit. He lost 5 lbs since the last visit.  I asked him to continue to watch his diet. 3. CAD: Branch vessel disease noted in 2001. No angina. Continue preventive therapy.  4. HTN: Controlled. Check labs in 6 months. 5. Premature beats today. Bigeminy from the last visit has resolved. Will have to recheck rhythm at next visit.  6. Hyperlipidemia: Checked in 4/17. COntinue current meds.  7. Erectile dysfunction:  He is not using nitrates.  Will call in sildenafil.    Current medicines are reviewed at length with the patient today.  The patient concerns regarding his medicines were addressed.  The following changes have been made:  Add Viagra prn  Labs/ tests ordered today include:  No orders of the defined types were placed in this encounter.   Recommend 150 minutes/week of aerobic exercise Low fat, low carb, high fiber diet recommended  Disposition:   FU in 6 months   Signed, Lance MussJayadeep Alix Stowers, MD  12/08/2015 8:27 AM    [facility] [address], [place], [place]  [address] Phone: [phone]; Fax: [phone]

## 2015-12-08 ENCOUNTER — Encounter: Payer: Self-pay | Admitting: Interventional Cardiology

## 2015-12-08 ENCOUNTER — Encounter (INDEPENDENT_AMBULATORY_CARE_PROVIDER_SITE_OTHER): Payer: Self-pay

## 2015-12-08 ENCOUNTER — Ambulatory Visit (INDEPENDENT_AMBULATORY_CARE_PROVIDER_SITE_OTHER): Payer: Medicare Other | Admitting: Interventional Cardiology

## 2015-12-08 VITALS — BP 130/60 | HR 60 | Ht 79.0 in | Wt 293.4 lb

## 2015-12-08 DIAGNOSIS — I2510 Atherosclerotic heart disease of native coronary artery without angina pectoris: Secondary | ICD-10-CM | POA: Diagnosis not present

## 2015-12-08 DIAGNOSIS — Z6833 Body mass index (BMI) 33.0-33.9, adult: Secondary | ICD-10-CM

## 2015-12-08 DIAGNOSIS — I420 Dilated cardiomyopathy: Secondary | ICD-10-CM

## 2015-12-08 DIAGNOSIS — I10 Essential (primary) hypertension: Secondary | ICD-10-CM

## 2015-12-08 DIAGNOSIS — E782 Mixed hyperlipidemia: Secondary | ICD-10-CM | POA: Diagnosis not present

## 2015-12-08 DIAGNOSIS — N529 Male erectile dysfunction, unspecified: Secondary | ICD-10-CM | POA: Insufficient documentation

## 2015-12-08 DIAGNOSIS — E669 Obesity, unspecified: Secondary | ICD-10-CM

## 2015-12-08 MED ORDER — SILDENAFIL CITRATE 100 MG PO TABS: 100.0000 mg | ORAL_TABLET | Freq: Every day | ORAL | 6 refills | Status: DC | PRN

## 2015-12-08 NOTE — Patient Instructions (Addendum)
Medication Instructions:  Start taking Viagra as needed (Take 1/2 tablet for a while then increase to a whole tablet)  All other medications remain the same.  Labwork: None  Testing/Procedures: None  Follow-Up: Your physician wants you to follow-up in: 9 months. You will receive a reminder letter in the mail two months in advance. If you don't receive a letter, please call our office to schedule the follow-up appointment.   If you need a refill on your cardiac medications before your next appointment, please call your pharmacy.

## 2016-03-06 DIAGNOSIS — M1712 Unilateral primary osteoarthritis, left knee: Secondary | ICD-10-CM | POA: Diagnosis not present

## 2016-03-06 DIAGNOSIS — M1711 Unilateral primary osteoarthritis, right knee: Secondary | ICD-10-CM | POA: Diagnosis not present

## 2016-04-03 DIAGNOSIS — M19012 Primary osteoarthritis, left shoulder: Secondary | ICD-10-CM | POA: Diagnosis not present

## 2016-04-03 DIAGNOSIS — M19011 Primary osteoarthritis, right shoulder: Secondary | ICD-10-CM | POA: Diagnosis not present

## 2016-04-30 ENCOUNTER — Other Ambulatory Visit: Payer: Self-pay | Admitting: Interventional Cardiology

## 2016-05-02 ENCOUNTER — Other Ambulatory Visit: Payer: Self-pay | Admitting: Interventional Cardiology

## 2016-05-22 DIAGNOSIS — M1712 Unilateral primary osteoarthritis, left knee: Secondary | ICD-10-CM | POA: Diagnosis not present

## 2016-05-22 DIAGNOSIS — M1711 Unilateral primary osteoarthritis, right knee: Secondary | ICD-10-CM | POA: Diagnosis not present

## 2016-05-23 DIAGNOSIS — Z23 Encounter for immunization: Secondary | ICD-10-CM | POA: Diagnosis not present

## 2016-05-29 ENCOUNTER — Other Ambulatory Visit: Payer: Self-pay | Admitting: Interventional Cardiology

## 2016-08-03 ENCOUNTER — Encounter: Payer: Self-pay | Admitting: Interventional Cardiology

## 2016-08-20 NOTE — Progress Notes (Signed)
Cardiology Office Note   Date:  08/21/2016   ID:  Matthew [name], DOB [birth year], MRN [id number]  PCP:  Billee Cashing, MD    No chief complaint on file. CAD   Wt Readings from Last 3 Encounters:  08/21/16 285 lb (129.3 kg)  12/08/15 293 lb 6.4 oz (133.1 kg)  06/11/15 298 lb 12.8 oz (135.5 kg)       History of Present Illness: Matthew [name] is a 76 y.o. male   who has had issues with HTN and obesity. He lost some weight years ago.  He has been as high as 345 lbs.  He has normal LVEF in 2014.  Mildly dilated aortic root at that time.   Denies : Chest pain. Dizziness. Leg edema. Nitroglycerin use. Orthopnea. Palpitations. Paroxysmal nocturnal dyspnea. Shortness of breath. Syncope.   Walking more to lose weight.  Improved diet as well.    He had some success with the Viagra.     Past Medical History:  Diagnosis Date  . CAD (coronary artery disease)   . Cardiomyopathy (HCC)   . Heart failure (HCC)   . HTN (hypertension)   . Obesity   . Osteoarthritis     Past Surgical History:  Procedure Laterality Date  . KNEE SURGERY       Current Outpatient Prescriptions  Medication Sig Dispense Refill  . amLODipine (NORVASC) 10 MG tablet take 1 tablet by mouth once daily 90 tablet 1  . aspirin 81 MG tablet Take 81 mg by mouth daily.    Marland Kitchen atorvastatin (LIPITOR) 10 MG tablet take 1 tablet by mouth once daily 90 tablet 1  . carvedilol (COREG) 25 MG tablet take 1 tablet by mouth twice a day with meals 60 tablet 5  . ramipril (ALTACE) 10 MG capsule take 1 capsule by mouth twice a day 180 capsule 1  . sildenafil (VIAGRA) 100 MG tablet Take 1 tablet (100 mg total) by mouth daily as needed for erectile dysfunction. 10 tablet 6  . spironolactone (ALDACTONE) 25 MG tablet take 1 tablet by mouth once daily 30 tablet 5  . traMADol (ULTRAM) 50 MG tablet Take by mouth every 6 (six) hours as needed (PAIN).      No current facility-administered medications for this visit.      Allergies:   Patient has no known allergies.    Social History:  The patient  reports that he has never smoked. He has never used smokeless tobacco. He reports that he does not drink alcohol or use drugs.   Family History:  The patient's family history includes Diabetes in his brother; Hypertension in his father.    ROS:  Please see the history of present illness.   Otherwise, review of systems are positive for rare edema; intentional weight loss.   All other systems are reviewed and negative.    PHYSICAL EXAM: VS:  BP (!) 130/52   Pulse 89   Ht 6\' 4"  (1.93 m)   Wt 285 lb (129.3 kg)   SpO2 99%   BMI 34.69 kg/m  , BMI Body mass index is 34.69 kg/m. GEN: Well nourished, well developed, in no acute distress  HEENT: normal  Neck: no JVD, carotid bruits, or masses Cardiac: RRR- frequent PVCs; no murmurs, rubs, or gallops,; tr pretibial edema  Respiratory:  clear to auscultation bilaterally, normal work of breathing GI: soft, nontender, nondistended, + BS MS: no deformity or atrophy  Skin: warm and dry, no rash Neuro:  Strength and  sensation are intact Psych: euthymic mood, full affect   EKG:   The ekg ordered today demonstrates NSR, Bigeminy PVCs   Recent Labs: No results found for requested labs within last 8760 hours.   Lipid Panel    Component Value Date/Time   CHOL 110 (L) 06/11/2015 0852   TRIG 68 06/11/2015 0852   HDL 34 (L) 06/11/2015 0852   CHOLHDL 3.2 06/11/2015 0852   VLDL 14 06/11/2015 0852   LDLCALC 62 06/11/2015 0852     Other studies Reviewed: Additional studies/ records that were reviewed today with results demonstrating: prior echo reviewed.   ASSESSMENT AND PLAN:  1. Prior cardiomyopathy: LVEF 50-55% in 2014.  Appears euvolemic.   2. Obesity: He has lost weight in the past 3 months.  Continue healthy diet.   3. HTN: BP controlled.  Continue current meds.  4. CAD: Branch vessel disease noted by cath in 2001.  No angina.  5. Premature beats,  noted.  He has had bigeminy in the past and again today.  Asymptomatic.  Check ECG in 6 months. 6. Erectile dysfunction: Viagra prescribed in the past.  It worked well.  OK to refill when he runs out. 7. Hyperlipidemia: COntinue atorvastatin.    Current medicines are reviewed at length with the patient today.  The patient concerns regarding his medicines were addressed.  The following changes have been made:  No change  Labs/ tests ordered today include: Labs today No orders of the defined types were placed in this encounter.   Recommend 150 minutes/week of aerobic exercise Low fat, low carb, high fiber diet recommended  Disposition:   FU in 6 months   Signed, Lance Muss, MD  08/21/2016 8:31 AM    [facility] [address] [place], [place], [place]  [address] Phone: [phone]; Fax: [phone]

## 2016-08-21 ENCOUNTER — Encounter (INDEPENDENT_AMBULATORY_CARE_PROVIDER_SITE_OTHER): Payer: Self-pay

## 2016-08-21 ENCOUNTER — Other Ambulatory Visit: Payer: Self-pay

## 2016-08-21 ENCOUNTER — Ambulatory Visit (INDEPENDENT_AMBULATORY_CARE_PROVIDER_SITE_OTHER): Payer: Medicare Other | Admitting: Interventional Cardiology

## 2016-08-21 ENCOUNTER — Encounter: Payer: Self-pay | Admitting: Interventional Cardiology

## 2016-08-21 VITALS — BP 130/52 | HR 89 | Ht 76.0 in | Wt 285.0 lb

## 2016-08-21 DIAGNOSIS — N529 Male erectile dysfunction, unspecified: Secondary | ICD-10-CM | POA: Diagnosis not present

## 2016-08-21 DIAGNOSIS — I420 Dilated cardiomyopathy: Secondary | ICD-10-CM | POA: Diagnosis not present

## 2016-08-21 DIAGNOSIS — I2510 Atherosclerotic heart disease of native coronary artery without angina pectoris: Secondary | ICD-10-CM

## 2016-08-21 DIAGNOSIS — E782 Mixed hyperlipidemia: Secondary | ICD-10-CM

## 2016-08-21 DIAGNOSIS — I10 Essential (primary) hypertension: Secondary | ICD-10-CM | POA: Diagnosis not present

## 2016-08-21 LAB — LIPID PANEL
CHOL/HDL RATIO: 3 ratio (ref 0.0–5.0)
Cholesterol, Total: 100 mg/dL (ref 100–199)
HDL: 33 mg/dL — ABNORMAL LOW (ref >39)
LDL CALC: 53 mg/dL (ref 0–99)
TRIGLYCERIDES: 71 mg/dL (ref 0–149)
VLDL Cholesterol Cal: 14 mg/dL (ref 5–40)

## 2016-08-21 LAB — COMPREHENSIVE METABOLIC PANEL
A/G RATIO: 1.2 (ref 1.2–2.2)
ALBUMIN: 3.9 g/dL (ref 3.5–4.8)
ALT: 11 IU/L (ref 0–44)
AST: 16 IU/L (ref 0–40)
Alkaline Phosphatase: 112 IU/L (ref 39–117)
BUN / CREAT RATIO: 23 (ref 10–24)
BUN: 42 mg/dL — ABNORMAL HIGH (ref 8–27)
Bilirubin Total: 0.4 mg/dL (ref 0.0–1.2)
CALCIUM: 9.2 mg/dL (ref 8.6–10.2)
CO2: 19 mmol/L — AB (ref 20–29)
Chloride: 105 mmol/L (ref 96–106)
Creatinine, Ser: 1.79 mg/dL — ABNORMAL HIGH (ref 0.76–1.27)
GFR, EST AFRICAN AMERICAN: 42 mL/min/{1.73_m2} — AB (ref >59)
GFR, EST NON AFRICAN AMERICAN: 36 mL/min/{1.73_m2} — AB (ref >59)
GLOBULIN, TOTAL: 3.3 g/dL (ref 1.5–4.5)
Glucose: 90 mg/dL (ref 65–99)
POTASSIUM: 5.4 mmol/L — AB (ref 3.5–5.2)
SODIUM: 140 mmol/L (ref 134–144)
TOTAL PROTEIN: 7.2 g/dL (ref 6.0–8.5)

## 2016-08-21 MED ORDER — SPIRONOLACTONE 25 MG PO TABS: 12.5000 mg | ORAL_TABLET | Freq: Every day | ORAL | 3 refills | Status: DC

## 2016-08-21 NOTE — Patient Instructions (Signed)
Medication Instructions:  Your physician recommends that you continue on your current medications as directed. Please refer to the Current Medication list given to you today.   Labwork: LABS TODAY: CMET, LIPIDS  Testing/Procedures: None ordered  Follow-Up: Your physician wants you to follow-up in: 6 months with Dr. Varanasi. You will receive a reminder letter in the mail two months in advance. If you don't receive a letter, please call our office to schedule the follow-up appointment.   Any Other Special Instructions Will Be Listed Below (If Applicable).     If you need a refill on your cardiac medications before your next appointment, please call your pharmacy.   

## 2016-08-29 ENCOUNTER — Other Ambulatory Visit: Payer: Medicare Other

## 2016-08-29 DIAGNOSIS — I420 Dilated cardiomyopathy: Secondary | ICD-10-CM | POA: Diagnosis not present

## 2016-08-29 LAB — BASIC METABOLIC PANEL
BUN / CREAT RATIO: 22 (ref 10–24)
BUN: 27 mg/dL (ref 8–27)
CHLORIDE: 105 mmol/L (ref 96–106)
CO2: 16 mmol/L — ABNORMAL LOW (ref 20–29)
CREATININE: 1.22 mg/dL (ref 0.76–1.27)
Calcium: 8.8 mg/dL (ref 8.6–10.2)
GFR calc Af Amer: 66 mL/min/{1.73_m2} (ref >59)
GFR calc non Af Amer: 57 mL/min/{1.73_m2} — ABNORMAL LOW (ref >59)
GLUCOSE: 93 mg/dL (ref 65–99)
POTASSIUM: 5.5 mmol/L — AB (ref 3.5–5.2)
SODIUM: 139 mmol/L (ref 134–144)

## 2016-08-30 ENCOUNTER — Telehealth: Payer: Self-pay | Admitting: Interventional Cardiology

## 2016-08-30 DIAGNOSIS — I420 Dilated cardiomyopathy: Secondary | ICD-10-CM

## 2016-08-30 DIAGNOSIS — I10 Essential (primary) hypertension: Secondary | ICD-10-CM

## 2016-08-30 NOTE — Telephone Encounter (Signed)
Patient informed and verbalized understanding of plan. 

## 2016-08-30 NOTE — Addendum Note (Signed)
Addended by: Eustace Moore on: 08/30/2016 11:40 AM   Modules accepted: Orders

## 2016-08-30 NOTE — Telephone Encounter (Signed)
Follow Up:    Returning Matthew [name]'s call from yesterday,concerning his lab results.

## 2016-09-07 ENCOUNTER — Other Ambulatory Visit: Payer: Medicare Other

## 2016-09-08 ENCOUNTER — Other Ambulatory Visit: Payer: Medicare Other | Admitting: *Deleted

## 2016-09-08 DIAGNOSIS — I10 Essential (primary) hypertension: Secondary | ICD-10-CM | POA: Diagnosis not present

## 2016-09-08 DIAGNOSIS — I420 Dilated cardiomyopathy: Secondary | ICD-10-CM | POA: Diagnosis not present

## 2016-09-08 LAB — BASIC METABOLIC PANEL
BUN/Creatinine Ratio: 17 (ref 10–24)
BUN: 20 mg/dL (ref 8–27)
CALCIUM: 9.1 mg/dL (ref 8.6–10.2)
CHLORIDE: 106 mmol/L (ref 96–106)
CO2: 18 mmol/L — AB (ref 20–29)
Creatinine, Ser: 1.21 mg/dL (ref 0.76–1.27)
GFR calc non Af Amer: 58 mL/min/{1.73_m2} — ABNORMAL LOW (ref >59)
GFR, EST AFRICAN AMERICAN: 67 mL/min/{1.73_m2} (ref >59)
GLUCOSE: 78 mg/dL (ref 65–99)
POTASSIUM: 5 mmol/L (ref 3.5–5.2)
Sodium: 139 mmol/L (ref 134–144)

## 2016-09-08 NOTE — Progress Notes (Signed)
Bmp

## 2016-09-08 NOTE — Addendum Note (Signed)
Addended by: Tonita Phoenix on: 09/08/2016 07:31 AM   Modules accepted: Orders

## 2016-10-26 ENCOUNTER — Other Ambulatory Visit: Payer: Self-pay | Admitting: Interventional Cardiology

## 2016-11-04 DIAGNOSIS — Z23 Encounter for immunization: Secondary | ICD-10-CM | POA: Diagnosis not present

## 2016-11-13 ENCOUNTER — Telehealth: Payer: Self-pay | Admitting: Interventional Cardiology

## 2016-11-13 DIAGNOSIS — I420 Dilated cardiomyopathy: Secondary | ICD-10-CM

## 2016-11-13 MED ORDER — FUROSEMIDE 40 MG PO TABS: 40.0000 mg | ORAL_TABLET | Freq: Every day | ORAL | 11 refills | Status: DC

## 2016-11-13 NOTE — Telephone Encounter (Signed)
He can try Lasix 40 mg daily and then check BMet in a week.

## 2016-11-13 NOTE — Telephone Encounter (Signed)
New message    Pt c/o medication issue: Patient is not currently taking this medication, but wants to start taking again for some swelling in foot.   1. Name of Medication: Spironolactone  2. How are you currently taking this medication (dosage and times per day)? 25 mg  3. Are you having a reaction (difficulty breathing--STAT)? No  4. What is your medication issue? Patient  is requesting this medication for swelling in foot.

## 2016-11-13 NOTE — Telephone Encounter (Signed)
Patient calling and states that he has had swelling in his lower extremities for the past couple of weeks. Patient denies any weight gain or SOB. Patient was taking spironolactone 25 mg daily but medication was d/c'd due to hyperkalemia. Patient wanting to know if he can restart spironolactone or another medicine to help with his swelling. Made patient aware that the information would be forwarded to Dr. Eldridge Dace for review and recommendation. Advised for patient to limit the amount of salt in his diet and to elevate his legs in the meantime. Patient verbalized understanding and thanked me for the call.

## 2016-11-13 NOTE — Telephone Encounter (Signed)
Called and made patient aware of Dr. Hoyle Barr recommendations to start lasix 40 mg QD and to recheck BMET in 1 week. Patient verbalized understanding. BMET ordered and scheduled for 10/10. Rx sent to patient's preferred pharmacy.

## 2016-11-21 ENCOUNTER — Ambulatory Visit (INDEPENDENT_AMBULATORY_CARE_PROVIDER_SITE_OTHER): Payer: Medicare Other

## 2016-11-21 ENCOUNTER — Ambulatory Visit (INDEPENDENT_AMBULATORY_CARE_PROVIDER_SITE_OTHER): Payer: Medicare Other | Admitting: Family Medicine

## 2016-11-21 ENCOUNTER — Encounter: Payer: Self-pay | Admitting: Family Medicine

## 2016-11-21 VITALS — BP 118/56 | HR 78 | Temp 98.3°F | Resp 16 | Ht 76.0 in | Wt 288.0 lb

## 2016-11-21 DIAGNOSIS — M25531 Pain in right wrist: Secondary | ICD-10-CM

## 2016-11-21 DIAGNOSIS — S8012XA Contusion of left lower leg, initial encounter: Secondary | ICD-10-CM | POA: Diagnosis not present

## 2016-11-21 DIAGNOSIS — S63501A Unspecified sprain of right wrist, initial encounter: Secondary | ICD-10-CM | POA: Diagnosis not present

## 2016-11-21 DIAGNOSIS — I2510 Atherosclerotic heart disease of native coronary artery without angina pectoris: Secondary | ICD-10-CM | POA: Diagnosis not present

## 2016-11-21 DIAGNOSIS — S40022A Contusion of left upper arm, initial encounter: Secondary | ICD-10-CM | POA: Diagnosis not present

## 2016-11-21 IMAGING — DX DG WRIST COMPLETE 3+V*R*
4 series · 4 of 4 positions shown · non-contrast
Comparison: None.

CLINICAL DATA: Right wrist pain

EXAM:
RIGHT WRIST - COMPLETE 3+ VIEW

[wrist pa]
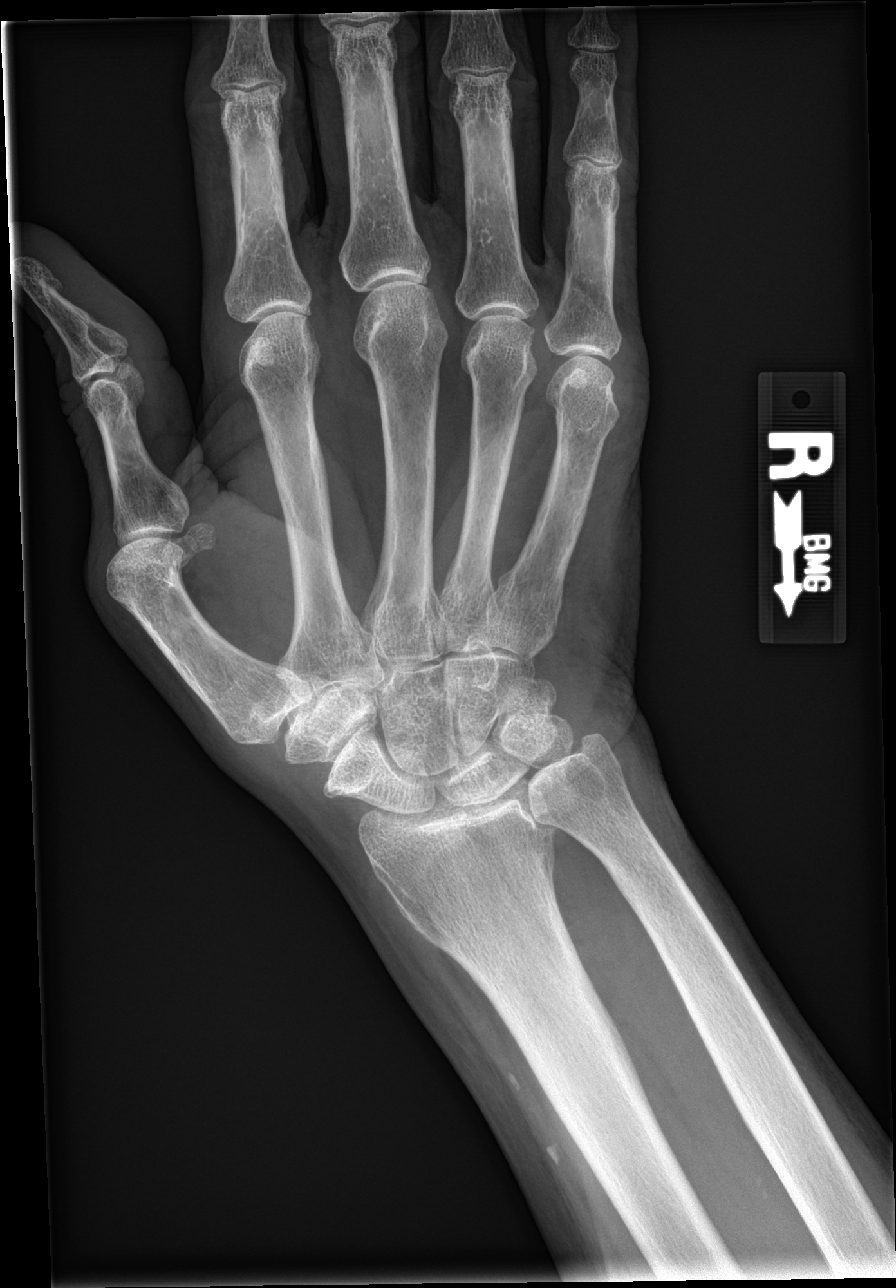

[wrist obl]
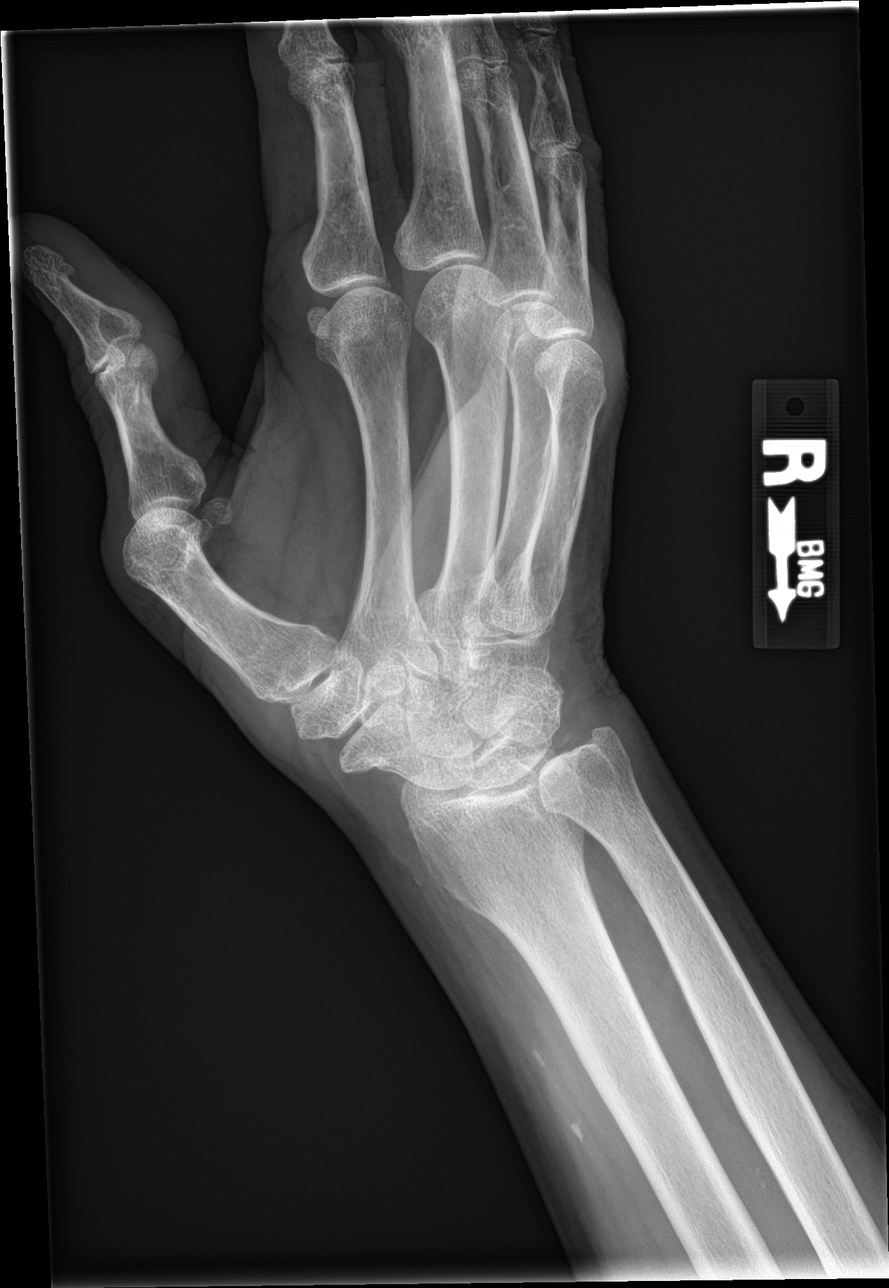

[wrist lat]
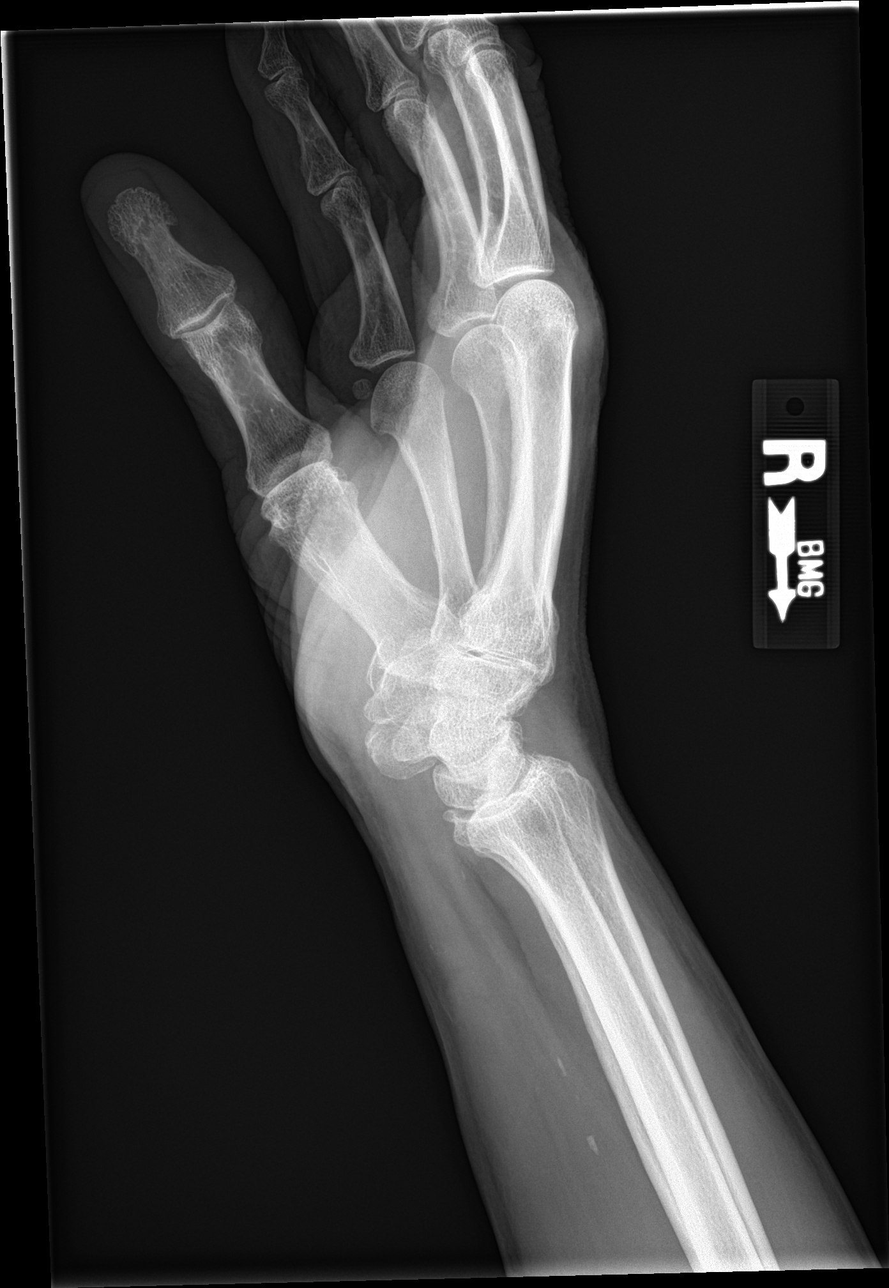

[wrist navicular]
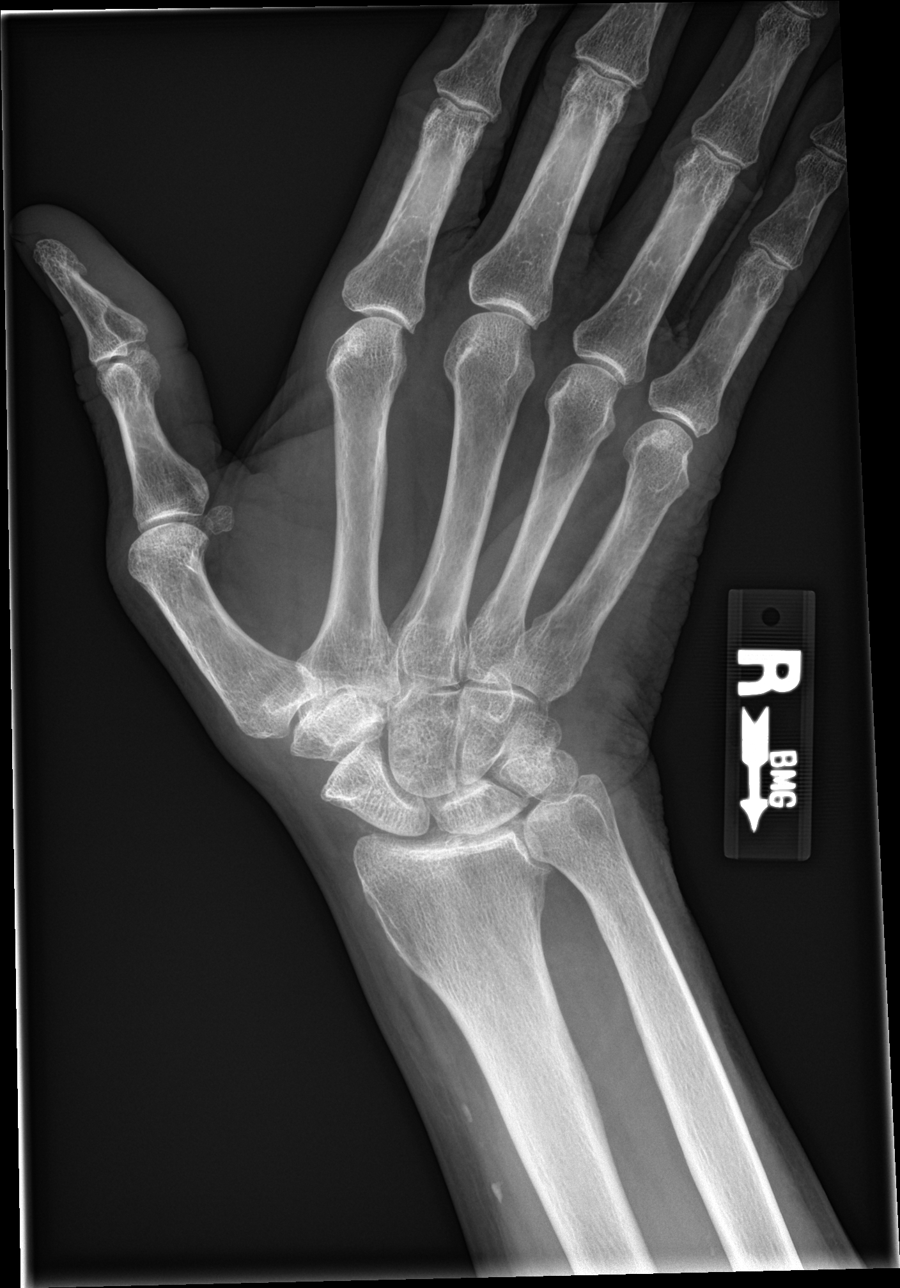

[4 of 4 positions shown; findings below may reference images not displayed]

FINDINGS: There is no evidence of fracture or dislocation. There is mild
osteoarthritis of the scaphotrapeziotrapezoid joint and first CMC
joint. Soft tissues are unremarkable.
IMPRESSION: No acute osseous injury of the right wrist.
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
## 2016-11-22 ENCOUNTER — Other Ambulatory Visit: Payer: Medicare Other | Admitting: *Deleted

## 2016-11-22 DIAGNOSIS — I420 Dilated cardiomyopathy: Secondary | ICD-10-CM

## 2016-11-22 LAB — BASIC METABOLIC PANEL
BUN/Creatinine Ratio: 10 (ref 10–24)
BUN: 11 mg/dL (ref 8–27)
CO2: 24 mmol/L (ref 20–29)
Calcium: 9 mg/dL (ref 8.6–10.2)
Chloride: 103 mmol/L (ref 96–106)
Creatinine, Ser: 1.1 mg/dL (ref 0.76–1.27)
GFR calc Af Amer: 75 mL/min/{1.73_m2} (ref >59)
GFR, EST NON AFRICAN AMERICAN: 65 mL/min/{1.73_m2} (ref >59)
Glucose: 89 mg/dL (ref 65–99)
POTASSIUM: 4.3 mmol/L (ref 3.5–5.2)
SODIUM: 141 mmol/L (ref 134–144)

## 2016-11-25 ENCOUNTER — Other Ambulatory Visit: Payer: Self-pay | Admitting: Interventional Cardiology

## 2016-11-27 ENCOUNTER — Other Ambulatory Visit: Payer: Self-pay

## 2017-01-01 DIAGNOSIS — M1712 Unilateral primary osteoarthritis, left knee: Secondary | ICD-10-CM | POA: Diagnosis not present

## 2017-01-01 DIAGNOSIS — M1711 Unilateral primary osteoarthritis, right knee: Secondary | ICD-10-CM | POA: Diagnosis not present

## 2017-01-11 ENCOUNTER — Telehealth: Payer: Self-pay | Admitting: Interventional Cardiology

## 2017-01-11 ENCOUNTER — Inpatient Hospital Stay (HOSPITAL_COMMUNITY): Payer: Medicare Other | Admitting: Anesthesiology

## 2017-01-11 ENCOUNTER — Observation Stay (HOSPITAL_COMMUNITY): Payer: Medicare Other

## 2017-01-11 ENCOUNTER — Other Ambulatory Visit: Payer: Self-pay | Admitting: Orthopedic Surgery

## 2017-01-11 ENCOUNTER — Encounter (HOSPITAL_COMMUNITY)
Admission: EM | Disposition: A | Discharge status: Skilled Nursing Facility | Payer: Self-pay | Source: Home / Self Care | Attending: Internal Medicine

## 2017-01-11 ENCOUNTER — Inpatient Hospital Stay (HOSPITAL_COMMUNITY)
Admission: EM | Admit: 2017-01-11 | Discharge: 2017-01-16 | DRG: 486 | Disposition: A | Discharge status: Skilled Nursing Facility | Payer: Medicare Other | Attending: Family Medicine | Admitting: Family Medicine

## 2017-01-11 ENCOUNTER — Emergency Department (HOSPITAL_COMMUNITY): Payer: Medicare Other

## 2017-01-11 ENCOUNTER — Encounter (HOSPITAL_COMMUNITY): Payer: Self-pay

## 2017-01-11 ENCOUNTER — Other Ambulatory Visit: Payer: Self-pay

## 2017-01-11 DIAGNOSIS — M1712 Unilateral primary osteoarthritis, left knee: Secondary | ICD-10-CM | POA: Diagnosis present

## 2017-01-11 DIAGNOSIS — Z713 Dietary counseling and surveillance: Secondary | ICD-10-CM | POA: Diagnosis not present

## 2017-01-11 DIAGNOSIS — Z79899 Other long term (current) drug therapy: Secondary | ICD-10-CM | POA: Diagnosis not present

## 2017-01-11 DIAGNOSIS — Z833 Family history of diabetes mellitus: Secondary | ICD-10-CM | POA: Diagnosis not present

## 2017-01-11 DIAGNOSIS — M62562 Muscle wasting and atrophy, not elsewhere classified, left lower leg: Secondary | ICD-10-CM | POA: Diagnosis not present

## 2017-01-11 DIAGNOSIS — I420 Dilated cardiomyopathy: Secondary | ICD-10-CM | POA: Diagnosis not present

## 2017-01-11 DIAGNOSIS — Z7982 Long term (current) use of aspirin: Secondary | ICD-10-CM | POA: Diagnosis not present

## 2017-01-11 DIAGNOSIS — B9561 Methicillin susceptible Staphylococcus aureus infection as the cause of diseases classified elsewhere: Secondary | ICD-10-CM | POA: Diagnosis not present

## 2017-01-11 DIAGNOSIS — R41841 Cognitive communication deficit: Secondary | ICD-10-CM | POA: Diagnosis not present

## 2017-01-11 DIAGNOSIS — R278 Other lack of coordination: Secondary | ICD-10-CM | POA: Diagnosis not present

## 2017-01-11 DIAGNOSIS — Z79891 Long term (current) use of opiate analgesic: Secondary | ICD-10-CM

## 2017-01-11 DIAGNOSIS — E871 Hypo-osmolality and hyponatremia: Secondary | ICD-10-CM | POA: Diagnosis not present

## 2017-01-11 DIAGNOSIS — I5032 Chronic diastolic (congestive) heart failure: Secondary | ICD-10-CM | POA: Diagnosis present

## 2017-01-11 DIAGNOSIS — Z5181 Encounter for therapeutic drug level monitoring: Secondary | ICD-10-CM | POA: Diagnosis not present

## 2017-01-11 DIAGNOSIS — I499 Cardiac arrhythmia, unspecified: Secondary | ICD-10-CM | POA: Diagnosis not present

## 2017-01-11 DIAGNOSIS — Z6834 Body mass index (BMI) 34.0-34.9, adult: Secondary | ICD-10-CM | POA: Diagnosis not present

## 2017-01-11 DIAGNOSIS — G8929 Other chronic pain: Secondary | ICD-10-CM | POA: Diagnosis present

## 2017-01-11 DIAGNOSIS — Z09 Encounter for follow-up examination after completed treatment for conditions other than malignant neoplasm: Secondary | ICD-10-CM

## 2017-01-11 DIAGNOSIS — M6281 Muscle weakness (generalized): Secondary | ICD-10-CM | POA: Diagnosis not present

## 2017-01-11 DIAGNOSIS — M2242 Chondromalacia patellae, left knee: Secondary | ICD-10-CM | POA: Diagnosis present

## 2017-01-11 DIAGNOSIS — M65862 Other synovitis and tenosynovitis, left lower leg: Secondary | ICD-10-CM | POA: Diagnosis present

## 2017-01-11 DIAGNOSIS — S8990XA Unspecified injury of unspecified lower leg, initial encounter: Secondary | ICD-10-CM | POA: Diagnosis not present

## 2017-01-11 DIAGNOSIS — Z6835 Body mass index (BMI) 35.0-35.9, adult: Secondary | ICD-10-CM | POA: Diagnosis not present

## 2017-01-11 DIAGNOSIS — E782 Mixed hyperlipidemia: Secondary | ICD-10-CM | POA: Diagnosis present

## 2017-01-11 DIAGNOSIS — B9562 Methicillin resistant Staphylococcus aureus infection as the cause of diseases classified elsewhere: Secondary | ICD-10-CM | POA: Diagnosis present

## 2017-01-11 DIAGNOSIS — B999 Unspecified infectious disease: Secondary | ICD-10-CM | POA: Diagnosis not present

## 2017-01-11 DIAGNOSIS — M7989 Other specified soft tissue disorders: Secondary | ICD-10-CM | POA: Diagnosis not present

## 2017-01-11 DIAGNOSIS — I10 Essential (primary) hypertension: Secondary | ICD-10-CM | POA: Diagnosis not present

## 2017-01-11 DIAGNOSIS — M009 Pyogenic arthritis, unspecified: Secondary | ICD-10-CM | POA: Diagnosis not present

## 2017-01-11 DIAGNOSIS — X58XXXA Exposure to other specified factors, initial encounter: Secondary | ICD-10-CM | POA: Diagnosis present

## 2017-01-11 DIAGNOSIS — R2681 Unsteadiness on feet: Secondary | ICD-10-CM | POA: Diagnosis not present

## 2017-01-11 DIAGNOSIS — R079 Chest pain, unspecified: Secondary | ICD-10-CM | POA: Diagnosis not present

## 2017-01-11 DIAGNOSIS — S83282A Other tear of lateral meniscus, current injury, left knee, initial encounter: Secondary | ICD-10-CM | POA: Diagnosis present

## 2017-01-11 DIAGNOSIS — R6520 Severe sepsis without septic shock: Secondary | ICD-10-CM | POA: Diagnosis not present

## 2017-01-11 DIAGNOSIS — E669 Obesity, unspecified: Secondary | ICD-10-CM | POA: Diagnosis not present

## 2017-01-11 DIAGNOSIS — M00062 Staphylococcal arthritis, left knee: Principal | ICD-10-CM | POA: Diagnosis present

## 2017-01-11 DIAGNOSIS — A419 Sepsis, unspecified organism: Secondary | ICD-10-CM | POA: Diagnosis not present

## 2017-01-11 DIAGNOSIS — G8911 Acute pain due to trauma: Secondary | ICD-10-CM | POA: Diagnosis not present

## 2017-01-11 DIAGNOSIS — J449 Chronic obstructive pulmonary disease, unspecified: Secondary | ICD-10-CM | POA: Diagnosis present

## 2017-01-11 DIAGNOSIS — D649 Anemia, unspecified: Secondary | ICD-10-CM | POA: Diagnosis present

## 2017-01-11 DIAGNOSIS — I429 Cardiomyopathy, unspecified: Secondary | ICD-10-CM | POA: Diagnosis present

## 2017-01-11 DIAGNOSIS — Z8249 Family history of ischemic heart disease and other diseases of the circulatory system: Secondary | ICD-10-CM

## 2017-01-11 DIAGNOSIS — M25562 Pain in left knee: Secondary | ICD-10-CM | POA: Diagnosis not present

## 2017-01-11 DIAGNOSIS — R4181 Age-related cognitive decline: Secondary | ICD-10-CM | POA: Diagnosis not present

## 2017-01-11 DIAGNOSIS — Z9889 Other specified postprocedural states: Secondary | ICD-10-CM | POA: Diagnosis not present

## 2017-01-11 DIAGNOSIS — S83242A Other tear of medial meniscus, current injury, left knee, initial encounter: Secondary | ICD-10-CM | POA: Diagnosis not present

## 2017-01-11 DIAGNOSIS — M94262 Chondromalacia, left knee: Secondary | ICD-10-CM | POA: Diagnosis not present

## 2017-01-11 DIAGNOSIS — I110 Hypertensive heart disease with heart failure: Secondary | ICD-10-CM | POA: Diagnosis present

## 2017-01-11 DIAGNOSIS — R001 Bradycardia, unspecified: Secondary | ICD-10-CM | POA: Diagnosis not present

## 2017-01-11 DIAGNOSIS — I2510 Atherosclerotic heart disease of native coronary artery without angina pectoris: Secondary | ICD-10-CM | POA: Diagnosis present

## 2017-01-11 DIAGNOSIS — Z5189 Encounter for other specified aftercare: Secondary | ICD-10-CM | POA: Diagnosis not present

## 2017-01-11 HISTORY — PX: SYNOVECTOMY: SHX5180

## 2017-01-11 HISTORY — PX: CHONDROPLASTY: SHX5177

## 2017-01-11 HISTORY — PX: MENISECTOMY: SHX5181

## 2017-01-11 HISTORY — PX: KNEE ARTHROSCOPY: SHX127

## 2017-01-11 LAB — I-STAT CG4 LACTIC ACID, ED: LACTIC ACID, VENOUS: 1.59 mmol/L (ref 0.5–1.9)

## 2017-01-11 LAB — CBC WITH DIFFERENTIAL/PLATELET
Basophils Absolute: 0 10*3/uL (ref 0.0–0.1)
Basophils Relative: 0 %
Eosinophils Absolute: 0.1 10*3/uL (ref 0.0–0.7)
Eosinophils Relative: 1 %
HCT: 39.8 % (ref 39.0–52.0)
HEMOGLOBIN: 13.1 g/dL (ref 13.0–17.0)
LYMPHS ABS: 1.1 10*3/uL (ref 0.7–4.0)
LYMPHS PCT: 10 %
MCH: 26.7 pg (ref 26.0–34.0)
MCHC: 32.9 g/dL (ref 30.0–36.0)
MCV: 81.1 fL (ref 78.0–100.0)
Monocytes Absolute: 1.3 10*3/uL — ABNORMAL HIGH (ref 0.1–1.0)
Monocytes Relative: 12 %
NEUTROS PCT: 77 %
Neutro Abs: 8.6 10*3/uL — ABNORMAL HIGH (ref 1.7–7.7)
Platelets: 209 10*3/uL (ref 150–400)
RBC: 4.91 MIL/uL (ref 4.22–5.81)
RDW: 16.2 % — ABNORMAL HIGH (ref 11.5–15.5)
WBC: 11.2 10*3/uL — AB (ref 4.0–10.5)

## 2017-01-11 LAB — C-REACTIVE PROTEIN: CRP: 19.4 mg/dL — ABNORMAL HIGH (ref <1.0)

## 2017-01-11 LAB — SYNOVIAL CELL COUNT + DIFF, W/ CRYSTALS
Eosinophils-Synovial: 0 % (ref 0–1)
Eosinophils-Synovial: 0 % (ref 0–1)
LYMPHOCYTES-SYNOVIAL FLD: 2 % (ref 0–20)
LYMPHOCYTES-SYNOVIAL FLD: 1 % (ref 0–20)
Monocyte-Macrophage-Synovial Fluid: 10 % — ABNORMAL LOW (ref 50–90)
Monocyte-Macrophage-Synovial Fluid: 5 % — ABNORMAL LOW (ref 50–90)
Neutrophil, Synovial: 88 % — ABNORMAL HIGH (ref 0–25)
Neutrophil, Synovial: 94 % — ABNORMAL HIGH (ref 0–25)
WBC, Synovial: 63750 /mm3 — ABNORMAL HIGH (ref 0–200)
WBC, Synovial: 71500 /mm3 — ABNORMAL HIGH (ref 0–200)

## 2017-01-11 LAB — BASIC METABOLIC PANEL
ANION GAP: 8 (ref 5–15)
BUN: 16 mg/dL (ref 6–20)
CHLORIDE: 100 mmol/L — AB (ref 101–111)
CO2: 25 mmol/L (ref 22–32)
Calcium: 8.6 mg/dL — ABNORMAL LOW (ref 8.9–10.3)
Creatinine, Ser: 1.04 mg/dL (ref 0.61–1.24)
GFR calc non Af Amer: >60 mL/min (ref >60)
Glucose, Bld: 103 mg/dL — ABNORMAL HIGH (ref 65–99)
POTASSIUM: 4.2 mmol/L (ref 3.5–5.1)
SODIUM: 133 mmol/L — AB (ref 135–145)

## 2017-01-11 LAB — SEDIMENTATION RATE: SED RATE: 60 mm/h — AB (ref 0–16)

## 2017-01-11 LAB — GRAM STAIN

## 2017-01-11 IMAGING — DX DG KNEE COMPLETE 4+V*L*
4 series · 4 of 4 positions shown · non-contrast
Comparison: None

CLINICAL DATA: Pain and swelling beginning yesterday, had injection
knee in knee on [REDACTED], today unable to move leg, history diabetes
mellitus, hypertension, heart failure

EXAM:
LEFT KNEE - COMPLETE 4+ VIEW

[x knee ap left]
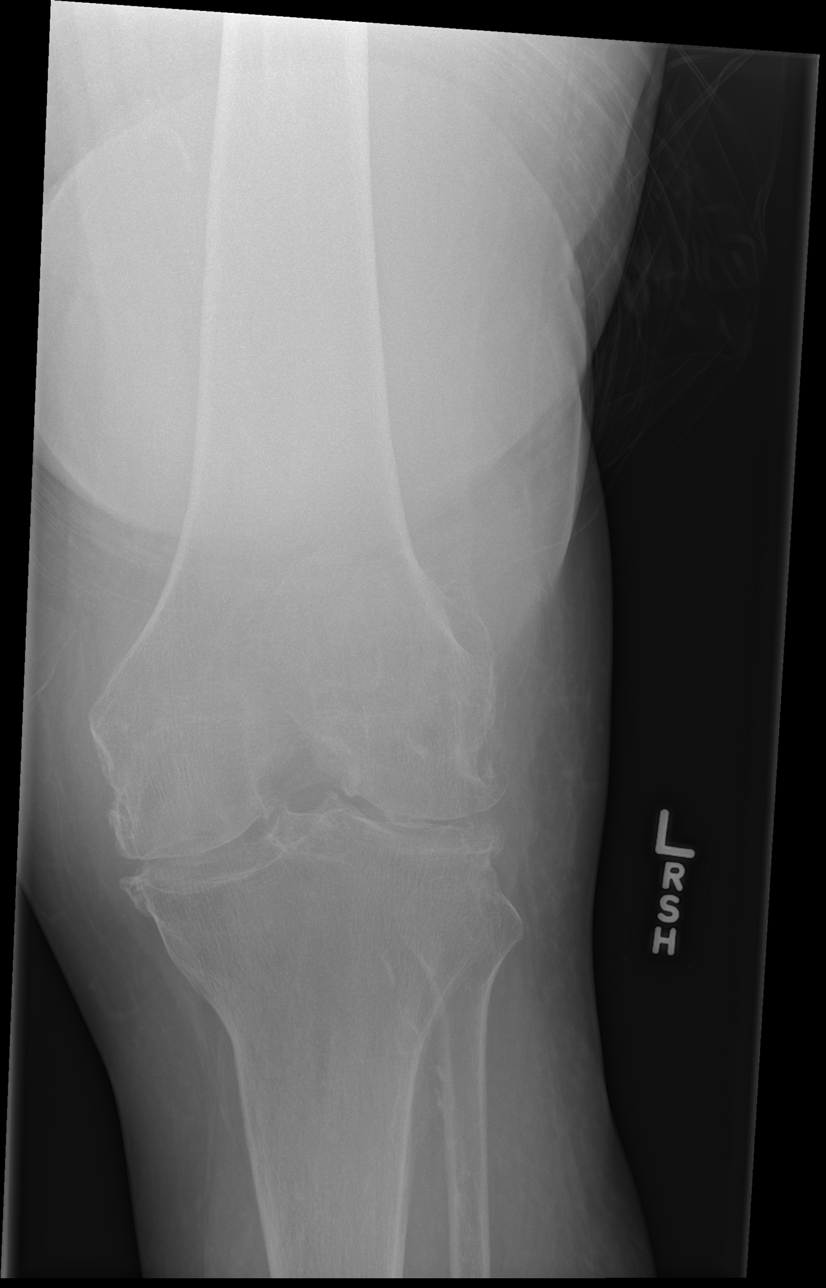

[x knee obl left (1 of 2)]
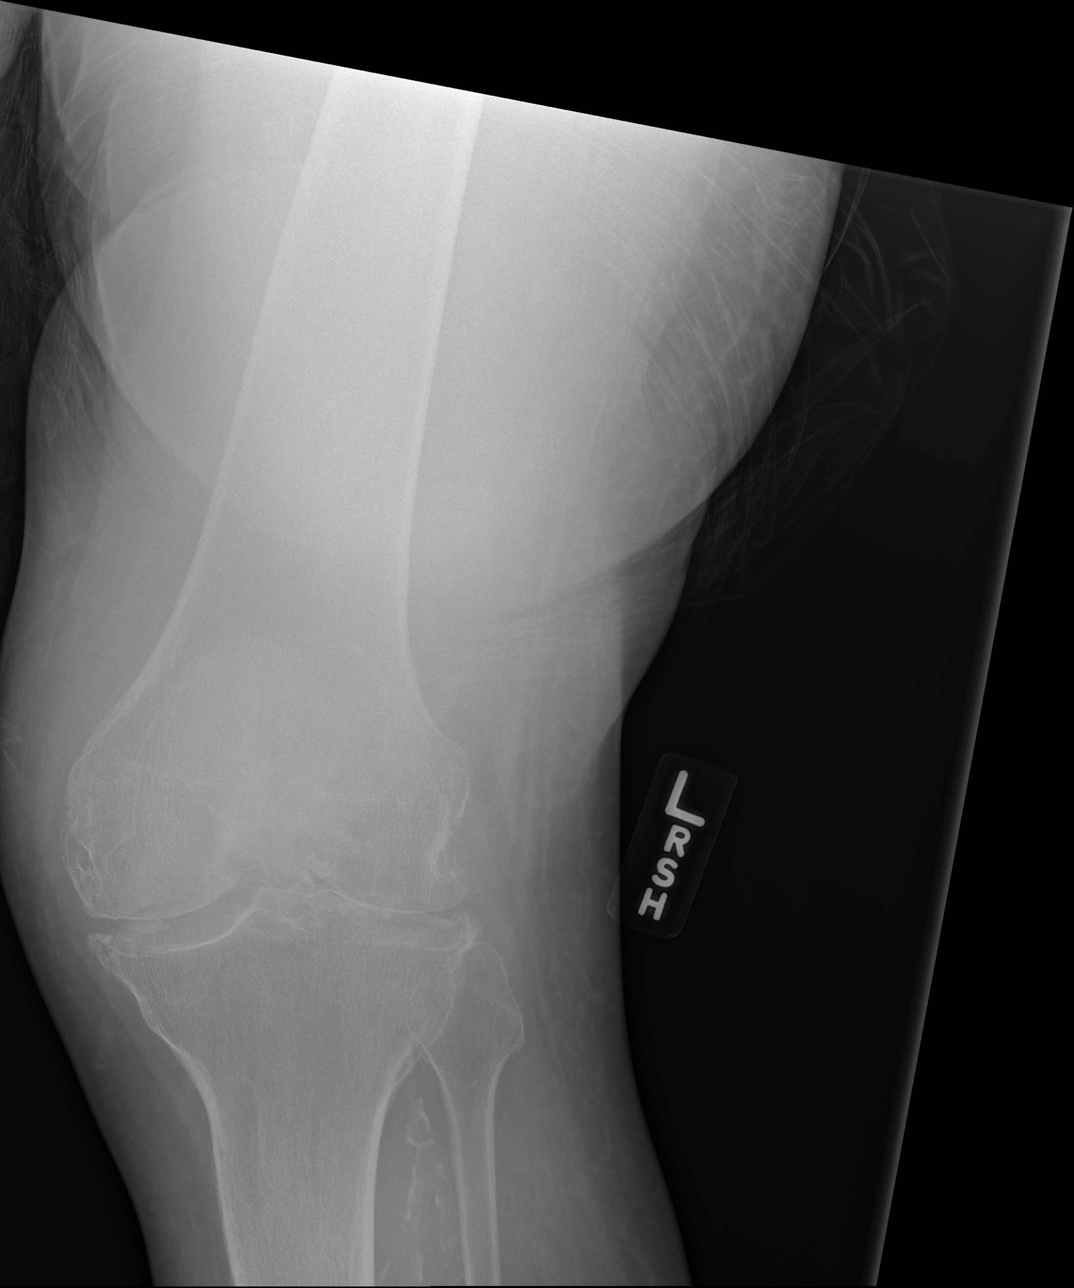

[x knee obl left (2 of 2)]
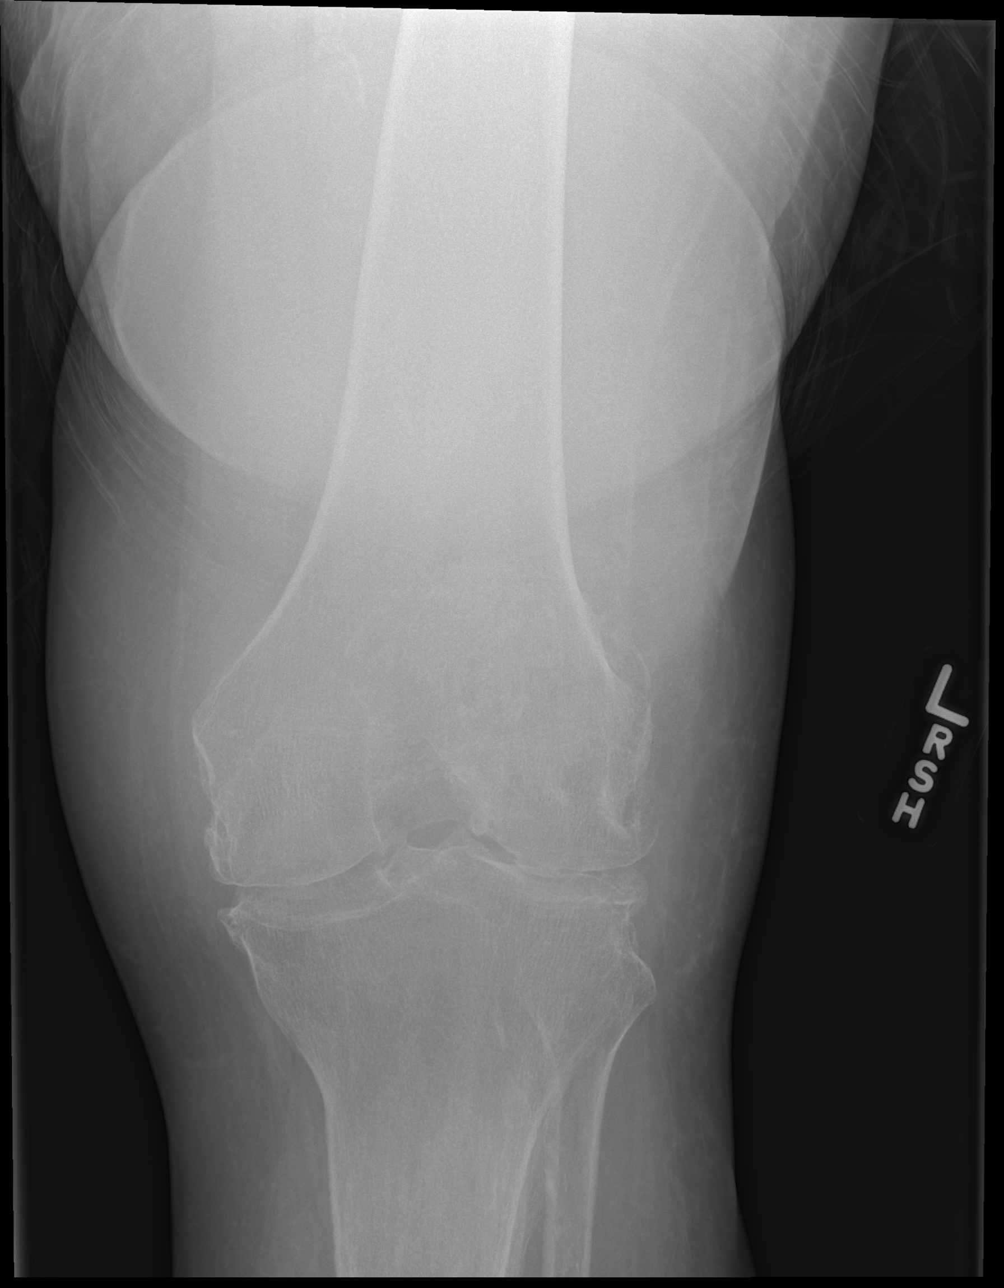

[x knee lat left]
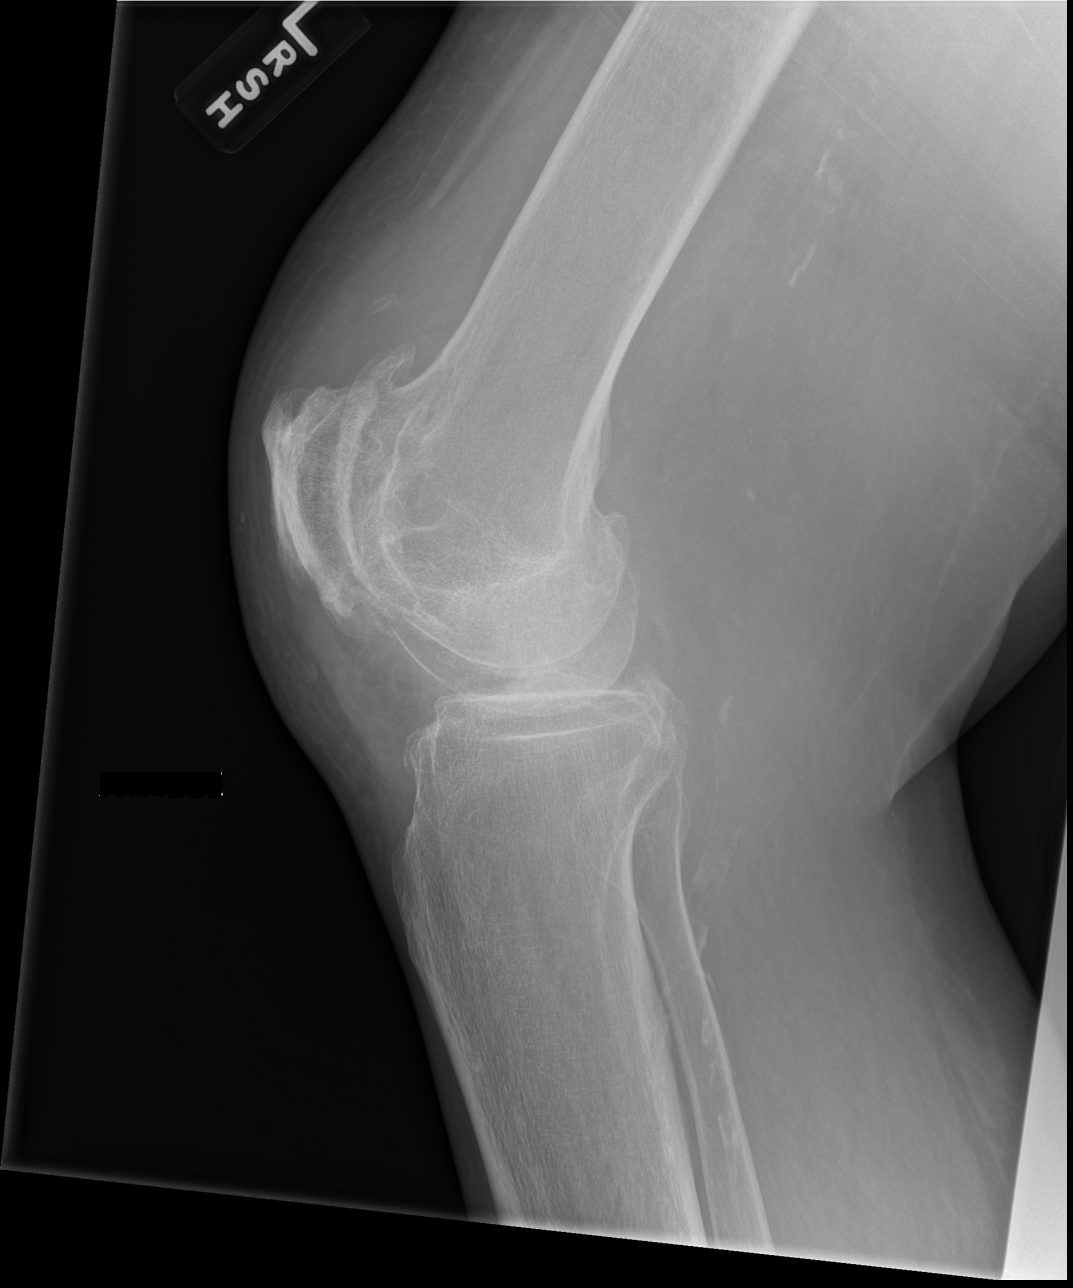

[4 of 4 positions shown; findings below may reference images not displayed]

FINDINGS: Osseous demineralization.

Tricompartmental osteoarthritic changes with joint space narrowing
and spur formation greatest at patellofemoral joint.

No acute fracture, dislocation, or bone destruction.

No significant knee joint effusion.

Scattered atherosclerotic calcifications.
IMPRESSION: Advanced tricompartmental osteoarthritic changes of the LEFT knee.

## 2017-01-11 IMAGING — DX DG CHEST 1V PORT
1 series · 1 of 1 positions shown · non-contrast
Comparison: Report of chest radiographs [DATE] (no images
available).

CLINICAL DATA: 76-year-old male with recent knee injection.
Increased pain and swelling.

EXAM:
PORTABLE CHEST 1 VIEW

[chest ap]
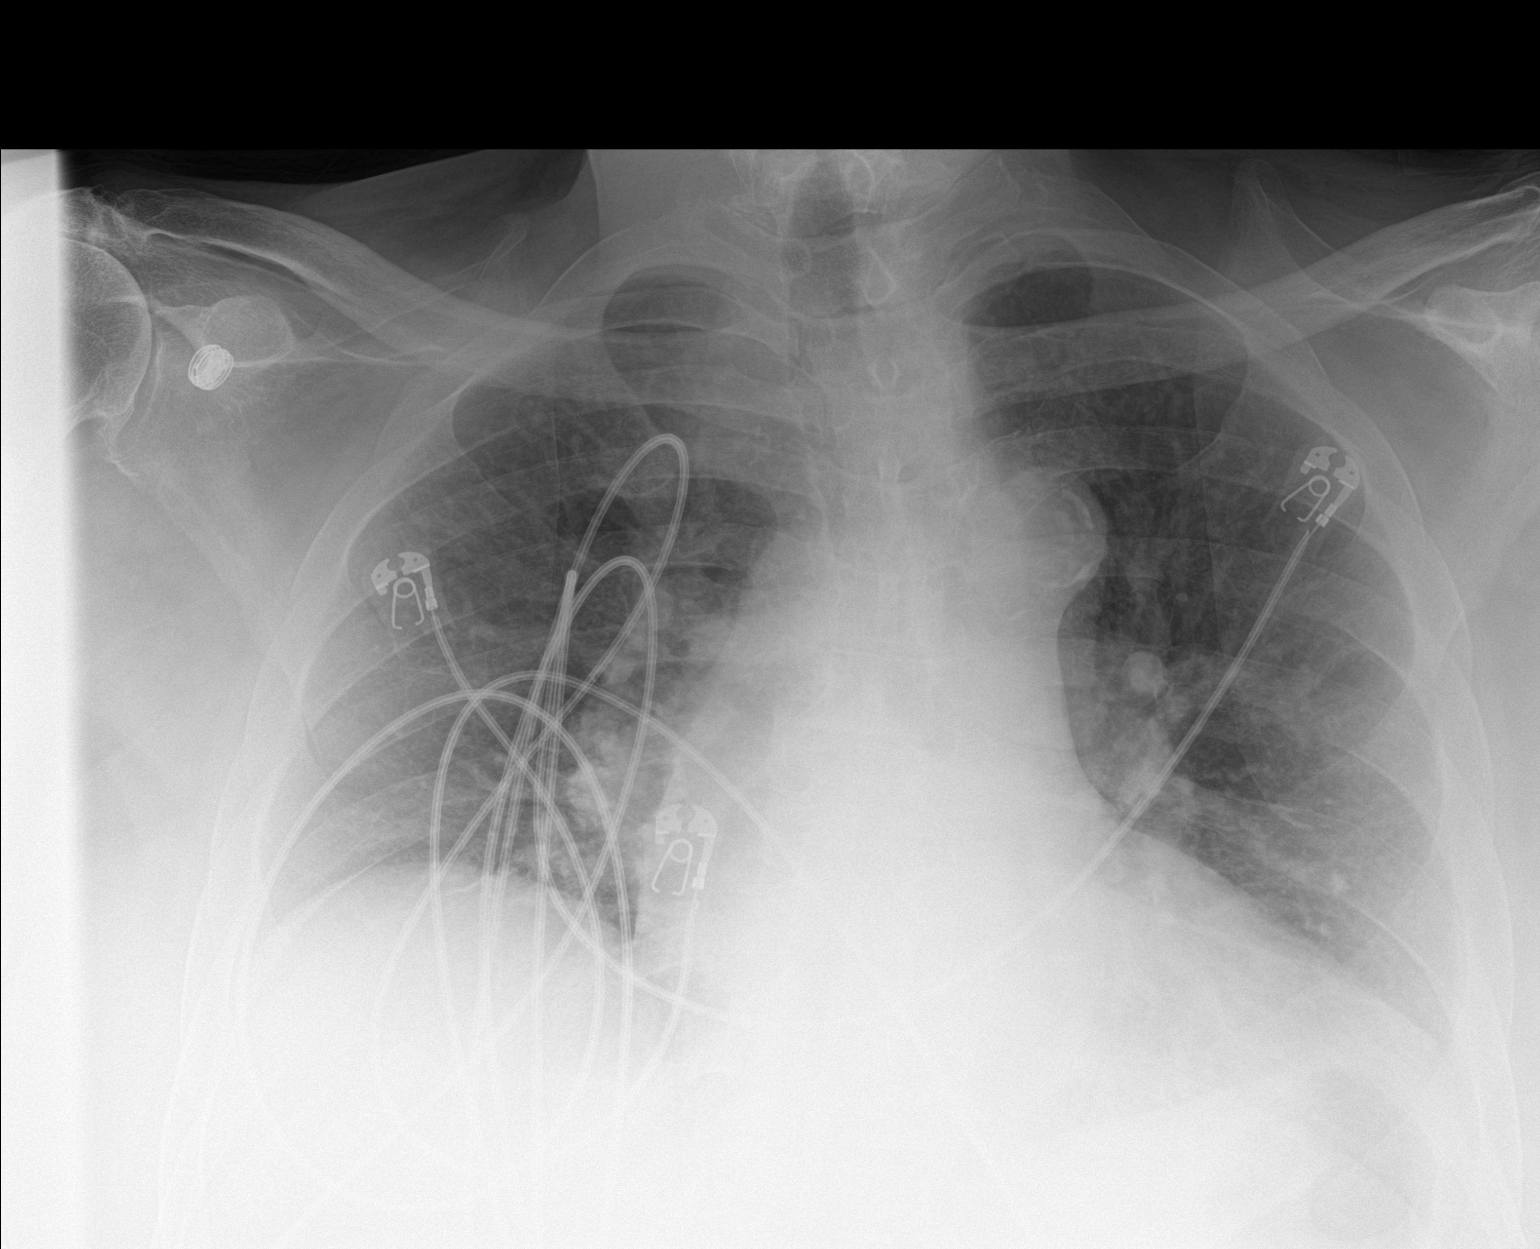

[1 of 1 positions shown; findings below may reference images not displayed]

FINDINGS: Portable AP semi upright view at [GB] hours. Low lung volumes. Mild
cardiomegaly. Tortuous thoracic aorta. Calcified aortic
atherosclerosis. Other mediastinal contours are within normal
limits. Visualized tracheal air column is within normal limits. No
pneumothorax, pulmonary edema, pleural effusion or confluent
pulmonary opacity.
IMPRESSION: Low lung volumes.  No acute cardiopulmonary abnormality identified.

## 2017-01-11 SURGERY — ARTHROSCOPY, KNEE
Anesthesia: General | Site: Knee | Laterality: Left

## 2017-01-11 MED ORDER — ONDANSETRON HCL 4 MG/2ML IJ SOLN: 4.0000 mg | Freq: Once | INTRAMUSCULAR | Status: DC | PRN

## 2017-01-11 MED ORDER — TRAMADOL HCL 50 MG PO TABS
50.0000 mg | ORAL_TABLET | Freq: Four times a day (QID) | ORAL | Status: DC | PRN
  Administered 2017-01-12 – 2017-01-14 (×3): 50 mg via ORAL
  Filled 2017-01-11 (×3): qty 1

## 2017-01-11 MED ORDER — ASPIRIN 81 MG PO TABS: 325.0000 mg | ORAL_TABLET | Freq: Every day | ORAL | Status: DC

## 2017-01-11 MED ORDER — SUGAMMADEX SODIUM 200 MG/2ML IV SOLN
INTRAVENOUS | Status: DC | PRN
  Administered 2017-01-11: 200 mg via INTRAVENOUS

## 2017-01-11 MED ORDER — SODIUM CHLORIDE 0.9 % IR SOLN
Status: DC | PRN
  Administered 2017-01-11: 2000 mL
  Administered 2017-01-11: 6000 mL

## 2017-01-11 MED ORDER — CARVEDILOL 3.125 MG PO TABS
3.1250 mg | ORAL_TABLET | Freq: Two times a day (BID) | ORAL | Status: DC
  Administered 2017-01-11: 3.125 mg via ORAL
  Filled 2017-01-11: qty 1

## 2017-01-11 MED ORDER — SODIUM CHLORIDE 0.9 % IV BOLUS (SEPSIS)
500.0000 mL | Freq: Once | INTRAVENOUS | Status: AC
  Administered 2017-01-11: 500 mL via INTRAVENOUS

## 2017-01-11 MED ORDER — DEXTROSE 5 % IV SOLN
2.0000 g | INTRAVENOUS | Status: DC
  Filled 2017-01-11: qty 2

## 2017-01-11 MED ORDER — FENTANYL CITRATE (PF) 100 MCG/2ML IJ SOLN
INTRAMUSCULAR | Status: DC | PRN
  Administered 2017-01-11: 25 ug via INTRAVENOUS
  Administered 2017-01-11 (×2): 50 ug via INTRAVENOUS

## 2017-01-11 MED ORDER — METOCLOPRAMIDE HCL 5 MG/ML IJ SOLN
INTRAMUSCULAR | Status: AC
  Filled 2017-01-11: qty 2

## 2017-01-11 MED ORDER — AMLODIPINE BESYLATE 10 MG PO TABS
10.0000 mg | ORAL_TABLET | Freq: Every day | ORAL | Status: DC
  Administered 2017-01-11 – 2017-01-15 (×5): 10 mg via ORAL
  Filled 2017-01-11 (×6): qty 1

## 2017-01-11 MED ORDER — DEXTROSE 5 % IV SOLN
INTRAVENOUS | Status: DC | PRN
  Administered 2017-01-11: 50 ug/min via INTRAVENOUS

## 2017-01-11 MED ORDER — ONDANSETRON HCL 4 MG PO TABS: 4.0000 mg | ORAL_TABLET | Freq: Four times a day (QID) | ORAL | Status: DC | PRN

## 2017-01-11 MED ORDER — BUPIVACAINE HCL (PF) 0.25 % IJ SOLN
INTRAMUSCULAR | Status: AC
  Filled 2017-01-11: qty 30

## 2017-01-11 MED ORDER — SODIUM CHLORIDE 0.9 % IV SOLN
INTRAVENOUS | Status: AC
  Administered 2017-01-11: 17:00:00 via INTRAVENOUS

## 2017-01-11 MED ORDER — FUROSEMIDE 40 MG PO TABS
40.0000 mg | ORAL_TABLET | Freq: Every day | ORAL | Status: DC
  Administered 2017-01-12 – 2017-01-16 (×5): 40 mg via ORAL
  Filled 2017-01-11 (×5): qty 1

## 2017-01-11 MED ORDER — HYDROMORPHONE HCL 1 MG/ML IJ SOLN: 0.2500 mg | INTRAMUSCULAR | Status: DC | PRN

## 2017-01-11 MED ORDER — EPINEPHRINE PF 1 MG/ML IJ SOLN
INTRAMUSCULAR | Status: AC
  Filled 2017-01-11: qty 1

## 2017-01-11 MED ORDER — KETOROLAC TROMETHAMINE 15 MG/ML IJ SOLN
15.0000 mg | Freq: Four times a day (QID) | INTRAMUSCULAR | Status: DC | PRN
Start: 2017-01-11 — End: 2017-01-11

## 2017-01-11 MED ORDER — CHLORHEXIDINE GLUCONATE 4 % EX LIQD: 60.0000 mL | Freq: Once | CUTANEOUS | Status: DC

## 2017-01-11 MED ORDER — ROCURONIUM BROMIDE 10 MG/ML (PF) SYRINGE
PREFILLED_SYRINGE | INTRAVENOUS | Status: AC
  Filled 2017-01-11: qty 5

## 2017-01-11 MED ORDER — EPHEDRINE SULFATE 50 MG/ML IJ SOLN
INTRAMUSCULAR | Status: DC | PRN
  Administered 2017-01-11: 10 mg via INTRAVENOUS

## 2017-01-11 MED ORDER — PHENYLEPHRINE HCL 10 MG/ML IJ SOLN
INTRAMUSCULAR | Status: DC | PRN
  Administered 2017-01-11 (×7): 80 ug via INTRAVENOUS

## 2017-01-11 MED ORDER — EPHEDRINE 5 MG/ML INJ
INTRAVENOUS | Status: AC
  Filled 2017-01-11: qty 10

## 2017-01-11 MED ORDER — SUCCINYLCHOLINE CHLORIDE 20 MG/ML IJ SOLN
INTRAMUSCULAR | Status: DC | PRN
  Administered 2017-01-11: 140 mg via INTRAVENOUS

## 2017-01-11 MED ORDER — EPINEPHRINE PF 1 MG/ML IJ SOLN
INTRAMUSCULAR | Status: DC | PRN
  Administered 2017-01-11: 1 mg

## 2017-01-11 MED ORDER — ONDANSETRON HCL 4 MG/2ML IJ SOLN
INTRAMUSCULAR | Status: DC | PRN
  Administered 2017-01-11: 4 mg via INTRAVENOUS

## 2017-01-11 MED ORDER — LIDOCAINE-EPINEPHRINE 2 %-1:100000 IJ SOLN
10.0000 mL | Freq: Once | INTRAMUSCULAR | Status: AC
  Administered 2017-01-11: 10 mL via INTRADERMAL
  Filled 2017-01-11: qty 10
  Filled 2017-01-11: qty 20

## 2017-01-11 MED ORDER — ACETAMINOPHEN 650 MG RE SUPP: 650.0000 mg | Freq: Four times a day (QID) | RECTAL | Status: DC | PRN

## 2017-01-11 MED ORDER — DEXTROSE 5 % IV SOLN
2.0000 g | Freq: Once | INTRAVENOUS | Status: AC
  Administered 2017-01-11: 2 g via INTRAVENOUS
  Filled 2017-01-11: qty 2

## 2017-01-11 MED ORDER — ASPIRIN 325 MG PO TABS
325.0000 mg | ORAL_TABLET | Freq: Every day | ORAL | Status: DC
  Administered 2017-01-12 – 2017-01-16 (×5): 325 mg via ORAL
  Filled 2017-01-11 (×5): qty 1

## 2017-01-11 MED ORDER — HYDROCODONE-ACETAMINOPHEN 5-325 MG PO TABS
1.0000 | ORAL_TABLET | Freq: Once | ORAL | Status: AC
  Administered 2017-01-11: 1 via ORAL
  Filled 2017-01-11: qty 1

## 2017-01-11 MED ORDER — CARVEDILOL 25 MG PO TABS
25.0000 mg | ORAL_TABLET | Freq: Two times a day (BID) | ORAL | Status: DC
  Administered 2017-01-12 – 2017-01-16 (×9): 25 mg via ORAL
  Filled 2017-01-11 (×9): qty 1

## 2017-01-11 MED ORDER — PROPOFOL 10 MG/ML IV BOLUS
INTRAVENOUS | Status: AC
  Filled 2017-01-11: qty 20

## 2017-01-11 MED ORDER — ONDANSETRON HCL 4 MG/2ML IJ SOLN: 4.0000 mg | Freq: Four times a day (QID) | INTRAMUSCULAR | Status: DC | PRN

## 2017-01-11 MED ORDER — LACTATED RINGERS IV SOLN
INTRAVENOUS | Status: DC
  Administered 2017-01-11: 17:00:00 via INTRAVENOUS

## 2017-01-11 MED ORDER — STERILE WATER FOR IRRIGATION IR SOLN
Status: DC | PRN
  Administered 2017-01-11: 1000 mL

## 2017-01-11 MED ORDER — LACTATED RINGERS IV SOLN
INTRAVENOUS | Status: DC | PRN
  Administered 2017-01-11: 18:00:00 via INTRAVENOUS

## 2017-01-11 MED ORDER — METOCLOPRAMIDE HCL 5 MG/ML IJ SOLN
INTRAMUSCULAR | Status: DC | PRN
  Administered 2017-01-11: 10 mg via INTRAVENOUS

## 2017-01-11 MED ORDER — PHENYLEPHRINE 40 MCG/ML (10ML) SYRINGE FOR IV PUSH (FOR BLOOD PRESSURE SUPPORT)
PREFILLED_SYRINGE | INTRAVENOUS | Status: AC
  Filled 2017-01-11: qty 20

## 2017-01-11 MED ORDER — PROPOFOL 10 MG/ML IV BOLUS
INTRAVENOUS | Status: DC | PRN
  Administered 2017-01-11: 50 mg via INTRAVENOUS
  Administered 2017-01-11: 150 mg via INTRAVENOUS

## 2017-01-11 MED ORDER — VANCOMYCIN HCL IN DEXTROSE 1-5 GM/200ML-% IV SOLN
1000.0000 mg | INTRAVENOUS | Status: AC
  Administered 2017-01-11: 1000 mg via INTRAVENOUS
  Filled 2017-01-11: qty 200

## 2017-01-11 MED ORDER — VANCOMYCIN HCL IN DEXTROSE 1-5 GM/200ML-% IV SOLN
INTRAVENOUS | Status: AC
  Filled 2017-01-11: qty 200

## 2017-01-11 MED ORDER — MEPERIDINE HCL 25 MG/ML IJ SOLN: 6.2500 mg | INTRAMUSCULAR | Status: DC | PRN

## 2017-01-11 MED ORDER — ONDANSETRON HCL 4 MG/2ML IJ SOLN
INTRAMUSCULAR | Status: AC
  Filled 2017-01-11: qty 2

## 2017-01-11 MED ORDER — FENTANYL CITRATE (PF) 250 MCG/5ML IJ SOLN
INTRAMUSCULAR | Status: AC
Start: 2017-01-11 — End: ?
  Filled 2017-01-11: qty 5

## 2017-01-11 MED ORDER — ACETAMINOPHEN 325 MG PO TABS
650.0000 mg | ORAL_TABLET | Freq: Four times a day (QID) | ORAL | Status: DC | PRN
  Administered 2017-01-13 – 2017-01-15 (×2): 650 mg via ORAL
  Filled 2017-01-11 (×2): qty 2

## 2017-01-11 MED ORDER — ROCURONIUM BROMIDE 100 MG/10ML IV SOLN
INTRAVENOUS | Status: DC | PRN
  Administered 2017-01-11: 30 mg via INTRAVENOUS

## 2017-01-11 MED ORDER — SENNOSIDES-DOCUSATE SODIUM 8.6-50 MG PO TABS
1.0000 | ORAL_TABLET | Freq: Every evening | ORAL | Status: DC | PRN
  Administered 2017-01-14: 1 via ORAL
  Filled 2017-01-11: qty 1

## 2017-01-11 MED ORDER — RAMIPRIL 10 MG PO CAPS
10.0000 mg | ORAL_CAPSULE | Freq: Two times a day (BID) | ORAL | Status: DC
  Administered 2017-01-12 – 2017-01-16 (×7): 10 mg via ORAL
  Filled 2017-01-11 (×11): qty 1

## 2017-01-11 MED ORDER — TRAZODONE HCL 50 MG PO TABS
25.0000 mg | ORAL_TABLET | Freq: Every evening | ORAL | Status: DC | PRN
  Administered 2017-01-12 – 2017-01-15 (×3): 25 mg via ORAL
  Filled 2017-01-11 (×4): qty 1

## 2017-01-11 MED ORDER — HYDROCODONE-ACETAMINOPHEN 5-325 MG PO TABS
1.0000 | ORAL_TABLET | Freq: Four times a day (QID) | ORAL | Status: DC | PRN
  Administered 2017-01-12 – 2017-01-16 (×13): 2 via ORAL
  Filled 2017-01-11 (×14): qty 2

## 2017-01-11 MED ORDER — ATORVASTATIN CALCIUM 10 MG PO TABS
10.0000 mg | ORAL_TABLET | Freq: Every day | ORAL | Status: DC
  Administered 2017-01-11 – 2017-01-15 (×5): 10 mg via ORAL
  Filled 2017-01-11 (×5): qty 1

## 2017-01-11 SURGICAL SUPPLY — 39 items
BANDAGE ACE 6X5 VEL STRL LF (GAUZE/BANDAGES/DRESSINGS) ×3 IMPLANT
BLADE CUDA 5.5 (BLADE) ×3 IMPLANT
BLADE CUTTER GATOR 3.5 (BLADE) IMPLANT
BLADE GREAT WHITE 4.2 (BLADE) ×4 IMPLANT
BLADE GREAT WHITE 4.2MM (BLADE) ×2
BNDG GAUZE ELAST 4 BULKY (GAUZE/BANDAGES/DRESSINGS) ×3 IMPLANT
CUFF TOURNIQUET SINGLE 34IN LL (TOURNIQUET CUFF) IMPLANT
CUFF TOURNIQUET SINGLE 44IN (TOURNIQUET CUFF) IMPLANT
DRAPE ARTHROSCOPY W/POUCH 114 (DRAPES) ×3 IMPLANT
DRAPE HALF SHEET 40X57 (DRAPES) ×3 IMPLANT
DRAPE U-SHAPE 47X51 STRL (DRAPES) ×3 IMPLANT
DRSG ADAPTIC 3X8 NADH LF (GAUZE/BANDAGES/DRESSINGS) ×3 IMPLANT
DRSG EMULSION OIL 3X3 NADH (GAUZE/BANDAGES/DRESSINGS) ×3 IMPLANT
DRSG PAD ABDOMINAL 8X10 ST (GAUZE/BANDAGES/DRESSINGS) ×3 IMPLANT
DURAPREP 26ML APPLICATOR (WOUND CARE) ×3 IMPLANT
FILTER STRAW FLUID ASPIR (MISCELLANEOUS) ×3 IMPLANT
GAUZE SPONGE 4X4 12PLY STRL (GAUZE/BANDAGES/DRESSINGS) ×3 IMPLANT
GLOVE BIOGEL PI IND STRL 8 (GLOVE) ×2 IMPLANT
GLOVE BIOGEL PI INDICATOR 8 (GLOVE) ×4
GLOVE ECLIPSE 7.5 STRL STRAW (GLOVE) ×6 IMPLANT
GOWN STRL REUS W/ TWL LRG LVL3 (GOWN DISPOSABLE) ×1 IMPLANT
GOWN STRL REUS W/ TWL XL LVL3 (GOWN DISPOSABLE) ×2 IMPLANT
GOWN STRL REUS W/TWL LRG LVL3 (GOWN DISPOSABLE) ×2
GOWN STRL REUS W/TWL XL LVL3 (GOWN DISPOSABLE) ×4
KIT BASIN OR (CUSTOM PROCEDURE TRAY) ×3 IMPLANT
KIT ROOM TURNOVER OR (KITS) ×3 IMPLANT
NEEDLE 18GX1X1/2 (RX/OR ONLY) (NEEDLE) ×3 IMPLANT
PACK ARTHROSCOPY DSU (CUSTOM PROCEDURE TRAY) ×3 IMPLANT
PAD ARMBOARD 7.5X6 YLW CONV (MISCELLANEOUS) ×6 IMPLANT
PAD CAST 4YDX4 CTTN HI CHSV (CAST SUPPLIES) ×1 IMPLANT
PADDING CAST COTTON 4X4 STRL (CAST SUPPLIES) ×2
SET ARTHROSCOPY TUBING (MISCELLANEOUS) ×2
SET ARTHROSCOPY TUBING LN (MISCELLANEOUS) ×1 IMPLANT
SUT ETHILON 2 0 FS 18 (SUTURE) ×3 IMPLANT
SUT ETHILON 4 0 PS 2 18 (SUTURE) ×3 IMPLANT
SYR 5ML LL (SYRINGE) ×3 IMPLANT
TOWEL OR 17X24 6PK STRL BLUE (TOWEL DISPOSABLE) ×3 IMPLANT
TOWEL OR 17X26 10 PK STRL BLUE (TOWEL DISPOSABLE) ×3 IMPLANT
WRAP KNEE MAXI GEL POST OP (GAUZE/BANDAGES/DRESSINGS) ×3 IMPLANT

## 2017-01-11 NOTE — ED Provider Notes (Signed)
[facility] EMERGENCY DEPARTMENT Provider Note   CSN: [id number] Arrival date & time: 01/11/17  8937     History   Chief Complaint Chief Complaint  Patient presents with  . Knee Pain    HPI OSWALDO CUETO is a 76 y.o. male.  REINHOLD RICKEY is 76 y.o. Male who presents to the ED complaining of left knee pain and swelling for the past 5 days.  Patient reports he has some chronic left knee pain that he receives cortisone injections for on a regular basis.  He reports he has had acutely worsening left knee pain with associated swelling and decreased range of motion for the past 5 days.  He has not been able to walk using his knee with nor bend his left knee.  He does have a history of septic joint in this left knee previously.  He denies any fevers or rashes.  No new injury to his knee.  He takes tramadol as needed for pain.  No treatments attempted prior to arrival.  He denies fevers, numbness, tingling, weakness, abdominal pain, chest pain, shortness of breath or rashes.   The history is provided by the patient and medical records. No language interpreter was used.    Past Medical History:  Diagnosis Date  . CAD (coronary artery disease)   . Cardiomyopathy ([place])   . Heart failure ([place])   . HTN (hypertension)   . Obesity   . Osteoarthritis     Patient Active Problem List   Diagnosis Date Noted  . Erectile dysfunction 12/08/2015  . Bigeminy 06/11/2015  . Congestive dilated cardiomyopathy ([place]) 12/11/2013  . Cardiomyopathy ([place]) 01/07/2013  . Obesity 01/07/2013  . Coronary atherosclerosis of native coronary artery 01/07/2013  . Mixed hyperlipidemia 01/07/2013  . Essential hypertension, benign 01/07/2013    Past Surgical History:  Procedure Laterality Date  . KNEE SURGERY         Home Medications    Prior to Admission medications   Medication Sig Start Date End Date Taking? Authorizing Provider  amLODipine (NORVASC) 10 MG tablet take 1  tablet by mouth once daily 10/26/16   Jettie Booze, MD  aspirin 81 MG tablet Take 81 mg by mouth daily.    [provider]  atorvastatin (LIPITOR) 10 MG tablet take 1 tablet by mouth once daily 10/26/16   Jettie Booze, MD  carvedilol (COREG) 25 MG tablet take 1 tablet by mouth twice a day with meals 11/27/16   Jettie Booze, MD  furosemide (LASIX) 40 MG tablet Take 1 tablet (40 mg total) by mouth daily. 11/13/16 02/11/17  Jettie Booze, MD  ramipril (ALTACE) 10 MG capsule take 1 capsule by mouth twice a day 10/26/16   Jettie Booze, MD  sildenafil (VIAGRA) 100 MG tablet Take 1 tablet (100 mg total) by mouth daily as needed for erectile dysfunction. 12/08/15   Jettie Booze, MD  traMADol (ULTRAM) 50 MG tablet Take by mouth every 6 (six) hours as needed (PAIN).     [provider]    Family History Family History  Problem Relation Age of Onset  . Hypertension Father   . Diabetes Brother   . Heart attack Neg Hx   . Stroke Neg Hx     Social History Social History   Tobacco Use  . Smoking status: Never Smoker  . Smokeless tobacco: Never Used  Substance Use Topics  . Alcohol use: No  . Drug use: No  Allergies   Patient has no known allergies.   Review of Systems Review of Systems  Constitutional: Negative for chills and fever.  HENT: Negative for congestion and sore throat.   Eyes: Negative for visual disturbance.  Respiratory: Negative for cough and shortness of breath.   Cardiovascular: Negative for chest pain.  Gastrointestinal: Negative for abdominal pain, diarrhea, nausea and vomiting.  Genitourinary: Negative for dysuria.  Musculoskeletal: Positive for arthralgias, gait problem and joint swelling. Negative for back pain and neck pain.  Skin: Negative for color change, rash and wound.  Neurological: Negative for weakness, numbness and headaches.     Physical Exam Updated Vital Signs BP (!) 106/42 (BP  Location: Left Arm)   Pulse (!) 50   Temp 99.8 F (37.7 C) (Oral)   Resp 16   SpO2 95%   Physical Exam  Constitutional: He appears well-developed and well-nourished. No distress.  HENT:  Head: Normocephalic and atraumatic.  Mouth/Throat: Oropharynx is clear and moist.  Eyes: Right eye exhibits no discharge. Left eye exhibits no discharge.  Neck: Neck supple.  Cardiovascular: Normal rate, regular rhythm, normal heart sounds and intact distal pulses.  Pulmonary/Chest: Effort normal and breath sounds normal. No respiratory distress. He has no wheezes. He has no rales.  Abdominal: Soft. There is no tenderness.  Musculoskeletal: He exhibits edema and tenderness. He exhibits no deformity.  Left knee edema and warmth and decreased ROM. No overlying skin changes. No erythema. Bilateral pedal and ankle edema.   Neurological: He is alert. No sensory deficit. He exhibits normal muscle tone. Coordination normal.  Skin: Skin is warm and dry. Capillary refill takes less than 2 seconds. No rash noted. He is not diaphoretic. No erythema. No pallor.  Psychiatric: He has a normal mood and affect. His behavior is normal.  Nursing note and vitals reviewed.    ED Treatments / Results  Labs (all labs ordered are listed, but only abnormal results are displayed) Labs Reviewed  BASIC METABOLIC PANEL - Abnormal; Notable for the following components:      Result Value   Sodium 133 (*)    Chloride 100 (*)    Glucose, Bld 103 (*)    Calcium 8.6 (*)    All other components within normal limits  CBC WITH DIFFERENTIAL/PLATELET - Abnormal; Notable for the following components:   WBC 11.2 (*)    RDW 16.2 (*)    Neutro Abs 8.6 (*)    Monocytes Absolute 1.3 (*)    All other components within normal limits  SEDIMENTATION RATE - Abnormal; Notable for the following components:   Sed Rate 60 (*)    All other components within normal limits  C-REACTIVE PROTEIN - Abnormal; Notable for the following components:     CRP 19.4 (*)    All other components within normal limits  SYNOVIAL CELL COUNT + DIFF, W/ CRYSTALS - Abnormal; Notable for the following components:   Color, Synovial YELLOW (*)    Appearance-Synovial TURBID (*)    WBC, Synovial 63,750 (*)    Neutrophil, Synovial 88 (*)    Monocyte-Macrophage-Synovial Fluid 10 (*)    All other components within normal limits  GRAM STAIN  CULTURE, BODY FLUID-BOTTLE  GLUCOSE, BODY FLUID OTHER  PROTEIN, BODY FLUID (OTHER)  I-STAT CG4 LACTIC ACID, ED  I-STAT CG4 LACTIC ACID, ED    EKG  EKG Interpretation None       Radiology Dg Knee Complete 4 Views Left  Result Date: 01/11/2017 CLINICAL DATA:  Pain and swelling  beginning yesterday, had injection knee in knee on Monday, today unable to move leg, history diabetes mellitus, hypertension, heart failure EXAM: LEFT KNEE - COMPLETE 4+ VIEW COMPARISON:  None FINDINGS: Osseous demineralization. Tricompartmental osteoarthritic changes with joint space narrowing and spur formation greatest at patellofemoral joint. No acute fracture, dislocation, or bone destruction. No significant knee joint effusion. Scattered atherosclerotic calcifications. IMPRESSION: Advanced tricompartmental osteoarthritic changes of the LEFT knee. Electronically Signed   By: Lavonia Dana M.D.   On: 01/11/2017 10:08    Procedures .Joint Aspiration/Arthrocentesis Date/Time: 01/11/2017 12:44 PM Performed by: Waynetta Pean, PA-C Authorized by: Waynetta Pean, PA-C   Consent:    Consent obtained:  Verbal and written   Consent given by:  Patient   Risks discussed:  Bleeding, infection, pain and incomplete drainage Location:    Location:  Knee   Knee:  L knee Anesthesia (see MAR for exact dosages):    Anesthesia method:  Local infiltration   Local anesthetic:  Lidocaine 2% WITH epi Procedure details:    Preparation: Patient was prepped and draped in usual sterile fashion     Needle gauge:  18 G   Ultrasound guidance:  no     Approach:  Lateral   Aspirate amount:  20 ml    Aspirate characteristics:  Cloudy   Steroid injected: no     Specimen collected: yes   Post-procedure details:    Dressing:  Adhesive bandage   Patient tolerance of procedure:  Tolerated well, no immediate complications   (including critical care time)  Medications Ordered in ED Medications  HYDROcodone-acetaminophen (NORCO/VICODIN) 5-325 MG per tablet 1 tablet (1 tablet Oral Given 01/11/17 1159)  lidocaine-EPINEPHrine (XYLOCAINE W/EPI) 2 %-1:100000 (with pres) injection 10 mL (10 mLs Intradermal Given by Other 01/11/17 1300)  cefTRIAXone (ROCEPHIN) 2 g in dextrose 5 % 50 mL IVPB (0 g Intravenous Stopped 01/11/17 1445)  sodium chloride 0.9 % bolus 500 mL (500 mLs Intravenous New Bag/Given 01/11/17 1420)     Initial Impression / Assessment and Plan / ED Course  I have reviewed the triage vital signs and the nursing notes.  Pertinent labs & imaging results that were available during my care of the patient were reviewed by me and considered in my medical decision making (see chart for details).  This is 76 y.o. Male who presents to the ED complaining of left knee pain and swelling for the past 5 days.  Patient reports he has some chronic left knee pain that he receives cortisone injections for on a regular basis.  He reports he has had acutely worsening left knee pain with associated swelling and decreased range of motion for the past 5 days.  He has not been able to walk using his knee with nor bend his left knee.  He does have a history of septic joint in this left knee previously.  He denies any fevers or rashes.  No new injury to his knee.  He takes tramadol as needed for pain.  No treatments attempted prior to arrival.   On exam the patient is afebrile and non-toxic appearing. He has edema and warmth of his left knee and minimal range of motion. No erythema.   Left knee x-ray shows advanced try compartmental osteoarthritic changes  of the left knee.  ESR is 60.  CRP is 19.4.  CBC shows a white count of 11,200.  Left knee joint aspiration was performed.  Cloudy fluid was obtained.  White blood cell count is 63,750. Suspect septic arthritis.  Will start Rocephin 2 g and consult with orthopedic surgery.  I consulted with his orthopedic surgeon Dr. [name].  He recommends medicine admission and he will send somebody to evaluate the patient shortly.  They may take him to the OR later today and they recommend keeping him n.p.o. until they determine when they will take him to the OR.  He agrees with plan for Rocephin.  Patient and family agree with plan for admission.  I consulted with Dyanne Carrel NP from [facility] who accepted the patient for admission.   This patient was discussed with Dr. Leonette Monarch who agrees with assessment and plan.   Final Clinical Impressions(s) / ED Diagnoses   Final diagnoses:  Pyogenic arthritis of left knee joint, due to unspecified organism [facility])    ED Discharge Orders    None       Waynetta Pean, PA-C 01/11/17 1541

## 2017-01-11 NOTE — Progress Notes (Signed)
Pharmacy Antibiotic Note  Matthew [name] is a 76 y.o. male admitted on 01/11/2017 with septic joint.  Pharmacy has been consulted for ceftriaxone dosing. WBC 11.2. Afebrile.   Plan: -Ceftriaxone 2 gm IV Q 24 hours -Pharmacy to sign off since no renal adjustments necessary   Height: 6\' 4"  (193 cm) Weight: 288 lb (130.6 kg) IBW/kg (Calculated) : 86.8  Temp (24hrs), Avg:98.5 F (36.9 C), Min:97.8 F (36.6 C), Max:99.8 F (37.7 C)  Recent Labs  Lab 01/11/17 1036 01/11/17 1407  WBC 11.2*  --   CREATININE 1.04  --   LATICACIDVEN  --  1.59    Estimated Creatinine Clearance: 89.1 mL/min (by C-G formula based on SCr of 1.04 mg/dL).    No Known Allergies  Antimicrobials this admission: Ceftriaxone 11/29>>  Dose adjustments this admission: None  Microbiology results: 11/29 L knee fluid   Thank you for allowing pharmacy to be a part of this patient's care.  Vinnie Level, PharmD., BCPS Clinical Pharmacist Pager [phone]

## 2017-01-11 NOTE — Progress Notes (Signed)
Patient stated he had not taken Carvedilol today. HR 74, BP 127/67. Dr. Michelle Piper made aware. Did not want patient to have Carvedilol at this time.

## 2017-01-11 NOTE — Telephone Encounter (Signed)
Patient wanting to let Dr. Eldridge Dace know that he is in the ER at [facility] for knee pain. He states that they are going to drain fluid off of his knee. Made patient aware the information would be forwarded.

## 2017-01-11 NOTE — Telephone Encounter (Signed)
New message     Patient called, states he is at [facility] ED . Wanted to make Dr Eldridge Dace aware he has swelling and is concerned about his care in ED.

## 2017-01-11 NOTE — Anesthesia Postprocedure Evaluation (Signed)
Anesthesia Post Note  Patient: Matthew [name]  Procedure(s) Performed: ARTHROSCOPIC IRRIGATION AND DEBRIDMENT KNEE (Left Knee) Four compartment SYNOVECTOMY (Left Knee) Lateral MENISECTOMY (Left Knee) CHONDROPLASTY of medial and patella compartment (Left Knee)     Patient location during evaluation: PACU Anesthesia Type: General Level of consciousness: awake and alert Pain management: pain level controlled Vital Signs Assessment: post-procedure vital signs reviewed and stable Respiratory status: spontaneous breathing, nonlabored ventilation, respiratory function stable and patient connected to nasal cannula oxygen Cardiovascular status: blood pressure returned to baseline and stable Postop Assessment: no apparent nausea or vomiting Anesthetic complications: no    Last Vitals:  Vitals:   01/11/17 2000 01/11/17 2015  BP: 120/65 113/62  Pulse: 76   Resp: 18   Temp:  36.8 C  SpO2: 98%     Last Pain:  Vitals:   01/11/17 1413  TempSrc: Oral  PainSc:                  Kennieth Rad

## 2017-01-11 NOTE — Anesthesia Procedure Notes (Signed)
Procedure Name: Intubation Date/Time: 01/11/2017 6:07 PM Performed by: Purvis Kilts, CRNA Pre-anesthesia Checklist: Patient identified, Emergency Drugs available, Suction available, Patient being monitored and Timeout performed Patient Re-evaluated:Patient Re-evaluated prior to induction Oxygen Delivery Method: Circle system utilized Preoxygenation: Pre-oxygenation with 100% oxygen Induction Type: IV induction Ventilation: Mask ventilation without difficulty Laryngoscope Size: Mac and 4 Grade View: Grade I Tube type: Oral Tube size: 7.5 mm Number of attempts: 1 Airway Equipment and Method: Stylet Placement Confirmation: ETT inserted through vocal cords under direct vision,  positive ETCO2 and breath sounds checked- equal and bilateral Secured at: 22 cm Tube secured with: Tape Dental Injury: Teeth and Oropharynx as per pre-operative assessment

## 2017-01-11 NOTE — ED Provider Notes (Signed)
Medical screening examination/treatment/procedure(s) were conducted as a shared visit with non-physician practitioner(s) and myself.  I personally evaluated the patient during the encounter. Briefly, the patient is a 76 y.o. male with a history of congestive heart failure, osteoarthritis who is followed by [facility] orthopedics.  He presents today with worsening left knee pain and swelling.  On exam patient has large knee effusion with decreased range of motion due to pain, and warmness to palpation.  Workup was concerning for septic arthritis.  Patient started on empiric antibiotics.  Discussed case with orthopedic surgery who agreed with admission for further management..    EKG Interpretation None           Cardama, Amadeo Garnet, MD 01/11/17 1513

## 2017-01-11 NOTE — Brief Op Note (Signed)
01/11/2017  7:07 PM  PATIENT:  Matthew [name]  76 y.o. male  PRE-OPERATIVE DIAGNOSIS:  septic  POST-OPERATIVE DIAGNOSIS:  * No post-op diagnosis entered *  PROCEDURE:  Procedure(s): ARTHROSCOPIC IRRIGATION AND DEBRIDMENT KNEE (Left) Four compartment SYNOVECTOMY (Left) Lateral MENISECTOMY (Left) CHONDROPLASTY of medial patella compartment (Left)  SURGEON:  Surgeon(s) and Role:    Jodi Geralds, MD - Primary  PHYSICIAN ASSISTANT:   ASSISTANTS: bethune   ANESTHESIA:   general  EBL: minimal  BLOOD ADMINISTERED:none  DRAINS: (2 med large) Hemovact drain(s) in the l knee with  Suction Open   LOCAL MEDICATIONS USED:  MARCAINE     SPECIMEN:  No Specimen  DISPOSITION OF SPECIMEN:  N/A  COUNTS:  YES  TOURNIQUET:  * No tourniquets in log *  DICTATION: .Other Dictation: Dictation Number [id number]  PLAN OF CARE: Admit to inpatient   PATIENT DISPOSITION:  PACU - hemodynamically stable.   Delay start of Pharmacological VTE agent (>24hrs) due to surgical blood loss or risk of bleeding: no

## 2017-01-11 NOTE — Brief Op Note (Signed)
01/11/2017  7:14 PM  PATIENT:  Matthew [name]  76 y.o. male  PRE-OPERATIVE DIAGNOSIS:  septic  POST-OPERATIVE DIAGNOSIS:  * No post-op diagnosis entered *  PROCEDURE:  Procedure(s): ARTHROSCOPIC IRRIGATION AND DEBRIDMENT KNEE (Left) Four compartment SYNOVECTOMY (Left) Lateral MENISECTOMY (Left) CHONDROPLASTY of medial patella compartment (Left)  SURGEON:  Surgeon(s) and Role:    Jodi Geralds, MD - Primary  PHYSICIAN ASSISTANT:   ASSISTANTS: bethune   ANESTHESIA:   general  EBL: none  BLOOD ADMINISTERED:none  DRAINS: (2) Hemovact drain(s) in the l knee with  Suction Open   LOCAL MEDICATIONS USED:  NONE  SPECIMEN:  No Specimen  DISPOSITION OF SPECIMEN:  N/A  COUNTS:  YES  TOURNIQUET:  * No tourniquets in log *  DICTATION: .Dragon Dictation  PLAN OF CARE: Admit to inpatient   PATIENT DISPOSITION:  PACU - hemodynamically stable.   Delay start of Pharmacological VTE agent (>24hrs) due to surgical blood loss or risk of bleeding: no

## 2017-01-11 NOTE — Anesthesia Preprocedure Evaluation (Signed)
Anesthesia Evaluation  Patient identified by MRN, date of birth, ID band Patient awake    Reviewed: Allergy & Precautions, NPO status , Patient's Chart, lab work & pertinent test results  Airway Mallampati: I  TM Distance: >3 FB Neck ROM: Full    Dental   Pulmonary    Pulmonary exam normal        Cardiovascular hypertension, Pt. on medications + CAD  Normal cardiovascular exam     Neuro/Psych    GI/Hepatic   Endo/Other    Renal/GU      Musculoskeletal   Abdominal   Peds  Hematology   Anesthesia Other Findings   Reproductive/Obstetrics                             Anesthesia Physical Anesthesia Plan  ASA: III  Anesthesia Plan: General   Post-op Pain Management:    Induction: Intravenous  PONV Risk Score and Plan: 2 and Ondansetron and Dexamethasone  Airway Management Planned: LMA  Additional Equipment:   Intra-op Plan:   Post-operative Plan: Extubation in OR  Informed Consent: I have reviewed the patients History and Physical, chart, labs and discussed the procedure including the risks, benefits and alternatives for the proposed anesthesia with the patient or authorized representative who has indicated his/her understanding and acceptance.     Plan Discussed with: Surgeon and CRNA  Anesthesia Plan Comments:         Anesthesia Quick Evaluation

## 2017-01-11 NOTE — Consult Note (Signed)
Reason for Consult: Left knee pain and swelling Referring Physician: Hospitalist  Matthew [name] is an 76 y.o. male.  HPI: The patient is well-known to [place].  He has severe patellofemoral arthritis.  We followed him for many years and discuss need for knee replacement.  He is not been able to get his head around that.  We have done multiple injections over the years with him.  We recently did an injection about a week ago.  He unfortunately showed up today with severe pain swelling and inability to stand and walk.  He presented to the emergency room and they aspirated his knee with greater than 60,000 white cells in the setting of effusion and inability to walk.  At that point we felt that the most appropriate course of action would be surgical intervention and he is admitted by the medicine service and will take him to the operating room for irrigation and debridement of his knee.  Past Medical History:  Diagnosis Date  . CAD (coronary artery disease)   . Cardiomyopathy ([place])   . Heart failure ([place])   . HTN (hypertension)   . Obesity   . Osteoarthritis     Past Surgical History:  Procedure Laterality Date  . KNEE SURGERY      Family History  Problem Relation Age of Onset  . Hypertension Father   . Diabetes Brother   . Heart attack Neg Hx   . Stroke Neg Hx     Social History:  reports that  has never smoked. he has never used smokeless tobacco. He reports that he does not drink alcohol or use drugs.  Allergies: No Known Allergies  Medications: I have reviewed the patient's current medications.  Results for orders placed or performed during the hospital encounter of 01/11/17 (from the past 48 hour(s))  Basic metabolic panel     Status: Abnormal   Collection Time: 01/11/17 10:36 AM  Result Value Ref Range   Sodium 133 (L) 135 - 145 mmol/L   Potassium 4.2 3.5 - 5.1 mmol/L   Chloride 100 (L) 101 - 111 mmol/L   CO2 25 22 - 32 mmol/L   Glucose, Bld 103 (H) 65 - 99 mg/dL   BUN 16  6 - 20 mg/dL   Creatinine, Ser 1.04 0.61 - 1.24 mg/dL   Calcium 8.6 (L) 8.9 - 10.3 mg/dL   GFR calc non Af Amer >60 >60 mL/min   GFR calc Af Amer >60 >60 mL/min    Comment: (NOTE) The eGFR has been calculated using the CKD EPI equation. This calculation has not been validated in all clinical situations. eGFR's persistently <60 mL/min signify possible Chronic Kidney Disease.    Anion gap 8 5 - 15  CBC with Differential     Status: Abnormal   Collection Time: 01/11/17 10:36 AM  Result Value Ref Range   WBC 11.2 (H) 4.0 - 10.5 K/uL   RBC 4.91 4.22 - 5.81 MIL/uL   Hemoglobin 13.1 13.0 - 17.0 g/dL   HCT 39.8 39.0 - 52.0 %   MCV 81.1 78.0 - 100.0 fL   MCH 26.7 26.0 - 34.0 pg   MCHC 32.9 30.0 - 36.0 g/dL   RDW 16.2 (H) 11.5 - 15.5 %   Platelets 209 150 - 400 K/uL   Neutrophils Relative % 77 %   Neutro Abs 8.6 (H) 1.7 - 7.7 K/uL   Lymphocytes Relative 10 %   Lymphs Abs 1.1 0.7 - 4.0 K/uL   Monocytes Relative 12 %  Monocytes Absolute 1.3 (H) 0.1 - 1.0 K/uL   Eosinophils Relative 1 %   Eosinophils Absolute 0.1 0.0 - 0.7 K/uL   Basophils Relative 0 %   Basophils Absolute 0.0 0.0 - 0.1 K/uL  Sedimentation rate     Status: Abnormal   Collection Time: 01/11/17 10:36 AM  Result Value Ref Range   Sed Rate 60 (H) 0 - 16 mm/hr  C-reactive protein     Status: Abnormal   Collection Time: 01/11/17 10:36 AM  Result Value Ref Range   CRP 19.4 (H) <1.0 mg/dL  Gram stain     Status: None   Collection Time: 01/11/17 11:26 AM  Result Value Ref Range   Specimen Description FLUID LEFT KNEE    Special Requests NONE    Gram Stain      ABUNDANT WBC PRESENT,BOTH PMN AND MONONUCLEAR MODERATE GRAM POSITIVE COCCI IN PAIRS CRITICAL RESULT CALLED TO, READ BACK BY AND VERIFIED WITH: Gilberto Better RN 15:15 01/11/17 ([email or web address])    Report Status 01/11/2017 FINAL   Synovial cell count + diff, w/ crystals     Status: Abnormal   Collection Time: 01/11/17  1:05 PM  Result Value Ref Range   Color, Synovial  YELLOW (A) YELLOW   Appearance-Synovial TURBID (A) CLEAR   Crystals, Fluid INTRACELLULAR CALCIUM PYROPHOSPHATE CRYSTALS    WBC, Synovial 63,750 (H) 0 - 200 /cu mm   Neutrophil, Synovial 88 (H) 0 - 25 %   Lymphocytes-Synovial Fld 2 0 - 20 %   Monocyte-Macrophage-Synovial Fluid 10 (L) 50 - 90 %   Eosinophils-Synovial 0 0 - 1 %   Other Cells-SYN BACTERIA NOTED, CORRELATE WITH MICROBIOLOGY.     Comment: CALLED TO C KING,RN 1437 01/11/17 D BRADLEY  I-Stat CG4 Lactic Acid, ED     Status: None   Collection Time: 01/11/17  2:07 PM  Result Value Ref Range   Lactic Acid, Venous 1.59 0.5 - 1.9 mmol/L    Dg Chest Port 1 View  Result Date: 01/11/2017 CLINICAL DATA:  76 year old male with recent knee injection. Increased pain and swelling. EXAM: PORTABLE CHEST 1 VIEW COMPARISON:  Report of chest radiographs 11/12/1999 (no images available). FINDINGS: Portable AP semi upright view at 1545 hours. Low lung volumes. Mild cardiomegaly. Tortuous thoracic aorta. Calcified aortic atherosclerosis. Other mediastinal contours are within normal limits. Visualized tracheal air column is within normal limits. No pneumothorax, pulmonary edema, pleural effusion or confluent pulmonary opacity. IMPRESSION: Low lung volumes.  No acute cardiopulmonary abnormality identified. Electronically Signed   By: Genevie Ann M.D.   On: 01/11/2017 16:06   Dg Knee Complete 4 Views Left  Result Date: 01/11/2017 CLINICAL DATA:  Pain and swelling beginning yesterday, had injection knee in knee on Monday, today unable to move leg, history diabetes mellitus, hypertension, heart failure EXAM: LEFT KNEE - COMPLETE 4+ VIEW COMPARISON:  None FINDINGS: Osseous demineralization. Tricompartmental osteoarthritic changes with joint space narrowing and spur formation greatest at patellofemoral joint. No acute fracture, dislocation, or bone destruction. No significant knee joint effusion. Scattered atherosclerotic calcifications. IMPRESSION: Advanced  tricompartmental osteoarthritic changes of the LEFT knee. Electronically Signed   By: Lavonia Dana M.D.   On: 01/11/2017 10:08    ROS  ROS: I have reviewed the patient's review of systems thoroughly and there are no positive responses as relates to the HPI. Blood pressure (!) 106/42, pulse (!) 50, temperature 99.8 F (37.7 C), temperature source Oral, resp. rate 16, height '6\' 4"'  (1.93 m), weight 130.6 kg (288 lb), SpO2  95 %. Physical Exam Well-developed well-nourished patient in no acute distress. Alert and oriented x3 HEENT:within normal limits Cardiac: Regular rate and rhythm Pulmonary: Lungs clear to auscultation Abdomen: Soft and nontender.  Normal active bowel sounds  Musculoskeletal: Left knee: Painful range of motion.  Limited range of motion.  3+ effusion.  Market warmth and erythema. Assessment/Plan: 76 year old male with sudden onset severe pain and inability to stand and walk in the left knee and the patient has had multiple injections in the past with known severe end-stage osteoarthritis.  Aspiration in the emergency room showed greater than 60,000 white cells.//After long discussion we feel that the only appropriate course of action will be irrigation debridement of the knee.  We will do this arthroscopically.  He will be admitted for IV antibiotics and will discuss antibiotic type and duration with infectious disease.  The patient is well aware of the risks associated with surgery.  He understands the risks to be bleeding, infection, need for further surgery, failure of the surgery to help, and the potential of death at the time or in the perioperative period.  He understands these risks and wishes to proceed.  Alta Corning 01/11/2017, 5:44 PM

## 2017-01-11 NOTE — H&P (Signed)
History and Physical    Matthew [name] UJW:[id number] DOB: Nov 09, 1940 DOA: 01/11/2017  PCP: Billee Cashing, MD Patient coming from: home  Chief Complaint: left knee pain  HPI: Matthew [name] is a very pleasant 76 y.o. male with medical history significant  obesity, lower extremity edema, cardiomyopathy, COPD presents to the emergency Department chief complaint of left knee pain and swelling. Initial evaluation is concerning for septic knee. ED provider discussed case with orthopedics who opined a medical admission needed with possible OR this afternoon.   Information from patient and chart. Patient states he has chronic knee pain and received cortisone injection the most recent being 4 days ago. Since that time he had gradual swelling and decreased range of motion and increased pain in that left knee. This morning he was unable to walk/bear weight on that knee nor bend it at all. He denies any headache fever chills nausea vomiting diarrhea. He denies chest pain palpitation but does endorse some lower extremity edema since he was taken off of his spironolactone 7 weeks ago. He was given Lasix but reports this does not control the swelling as well. Today he indicates that the swelling in his left foot is "close to its usual". He denies headache dizziness visual disturbances syncope or near-syncope. He denies any dysuria hematuria frequency or urgency. He denies any diarrhea constipation melena bright red blood per rectum. He does have a history of septic joint.     ED Course: In emergency department he's afebrile hemodynamically stable and not hypoxic. He is provided with Rocephin 2 g intravenously and NS. Left knee aspirated as well  Review of Systems: As per HPI otherwise all other systems reviewed and are negative.   Ambulatory Status: He lives at home with his son and daughter he uses a Matthew [name] of late he is independent with ADLs  Past Medical History:  Diagnosis Date  . CAD  (coronary artery disease)   . Cardiomyopathy ([facility])   . Heart failure ([facility])   . HTN (hypertension)   . Obesity   . Osteoarthritis     Past Surgical History:  Procedure Laterality Date  . KNEE SURGERY      Social History   Socioeconomic History  . Marital status: Married    Spouse name: Not on file  . Number of children: Not on file  . Years of education: Not on file  . Highest education level: Not on file  Social Needs  . Financial resource strain: Not on file  . Food insecurity - worry: Not on file  . Food insecurity - inability: Not on file  . Transportation needs - medical: Not on file  . Transportation needs - non-medical: Not on file  Occupational History  . Not on file  Tobacco Use  . Smoking status: Never Smoker  . Smokeless tobacco: Never Used  Substance and Sexual Activity  . Alcohol use: No  . Drug use: No  . Sexual activity: Not on file  Other Topics Concern  . Not on file  Social History Narrative  . Not on file    No Known Allergies  Family History  Problem Relation Age of Onset  . Hypertension Father   . Diabetes Brother   . Heart attack Neg Hx   . Stroke Neg Hx     Prior to Admission medications   Medication Sig Start Date End Date Taking? Authorizing Provider  amLODipine (NORVASC) 10 MG tablet take 1 tablet by mouth once daily 10/26/16  Corky Crafts, MD  aspirin 81 MG tablet Take 81 mg by mouth daily.    [provider]  atorvastatin (LIPITOR) 10 MG tablet take 1 tablet by mouth once daily 10/26/16   Corky Crafts, MD  carvedilol (COREG) 25 MG tablet take 1 tablet by mouth twice a day with meals 11/27/16   Corky Crafts, MD  furosemide (LASIX) 40 MG tablet Take 1 tablet (40 mg total) by mouth daily. 11/13/16 02/11/17  Corky Crafts, MD  ramipril (ALTACE) 10 MG capsule take 1 capsule by mouth twice a day 10/26/16   Corky Crafts, MD  sildenafil (VIAGRA) 100 MG tablet Take 1 tablet (100 mg total) by  mouth daily as needed for erectile dysfunction. 12/08/15   Corky Crafts, MD  traMADol (ULTRAM) 50 MG tablet Take by mouth every 6 (six) hours as needed (PAIN).     [provider]    Physical Exam: Vitals:   01/11/17 0839 01/11/17 1330 01/11/17 1413  BP: 113/90 (!) 106/42   Pulse: 81 (!) 50   Resp: 16 16   Temp: 97.9 F (36.6 C) 97.8 F (36.6 C) 99.8 F (37.7 C)  TempSrc: Oral Oral Oral  SpO2: 97% 95%      General:  Appears calm and comfortable sitting up in bed in no acute distress Eyes:  PERRL, EOMI, normal lids, iris ENT:  grossly normal hearing, lips & tongue, mucous membranes of his mouth are pink only slightly dry Neck:  no LAD, masses or thyromegaly Cardiovascular:  RRR, no m/r/g. 2+ lower extremity edema on the left 1+ LE  Edema on right.  Respiratory:  CTA bilaterally, no w/r/r. Normal respiratory effort. Abdomen:  soft, ntnd, positive bowel sounds throughout no guarding or rebounding Skin:  no rash or induration seen on limited exam Musculoskeletal:  Left knee with swelling, decreased range of motion and mild warmth as well as tenderness to touch. No erythema Psychiatric:  grossly normal mood and affect, speech fluent and appropriate, AOx3 Neurologic:  CN 2-12 grossly intact, moves all extremities in coordinated fashion, sensation intact speech clear facial symmetry  Labs on Admission: I have personally reviewed following labs and imaging studies  CBC: Recent Labs  Lab 01/11/17 1036  WBC 11.2*  NEUTROABS 8.6*  HGB 13.1  HCT 39.8  MCV 81.1  PLT 209   Basic Metabolic Panel: Recent Labs  Lab 01/11/17 1036  NA 133*  K 4.2  CL 100*  CO2 25  GLUCOSE 103*  BUN 16  CREATININE 1.04  CALCIUM 8.6*   GFR: CrCl cannot be calculated (Unknown ideal weight.). Liver Function Tests: No results for input(s): AST, ALT, ALKPHOS, BILITOT, PROT, ALBUMIN in the last 168 hours. No results for input(s): LIPASE, AMYLASE in the last 168 hours. No results  for input(s): AMMONIA in the last 168 hours. Coagulation Profile: No results for input(s): INR, PROTIME in the last 168 hours. Cardiac Enzymes: No results for input(s): CKTOTAL, CKMB, CKMBINDEX, TROPONINI in the last 168 hours. BNP (last 3 results) No results for input(s): PROBNP in the last 8760 hours. HbA1C: No results for input(s): HGBA1C in the last 72 hours. CBG: No results for input(s): GLUCAP in the last 168 hours. Lipid Profile: No results for input(s): CHOL, HDL, LDLCALC, TRIG, CHOLHDL, LDLDIRECT in the last 72 hours. Thyroid Function Tests: No results for input(s): TSH, T4TOTAL, FREET4, T3FREE, THYROIDAB in the last 72 hours. Anemia Panel: No results for input(s): VITAMINB12, FOLATE, FERRITIN, TIBC, IRON, RETICCTPCT in the  last 72 hours. Urine analysis: No results found for: COLORURINE, APPEARANCEUR, LABSPEC, PHURINE, GLUCOSEU, HGBUR, BILIRUBINUR, KETONESUR, PROTEINUR, UROBILINOGEN, NITRITE, LEUKOCYTESUR  Creatinine Clearance: CrCl cannot be calculated (Unknown ideal weight.).  Sepsis Labs: @LABRCNTIP (procalcitonin:4,lacticidven:4) ) Recent Results (from the past 240 hour(s))  Gram stain     Status: None   Collection Time: 01/11/17 11:26 AM  Result Value Ref Range Status   Specimen Description FLUID LEFT KNEE  Final   Special Requests NONE  Final   Gram Stain   Final    ABUNDANT WBC PRESENT,BOTH PMN AND MONONUCLEAR MODERATE GRAM POSITIVE COCCI IN PAIRS CRITICAL RESULT CALLED TO, READ BACK BY AND VERIFIED WITH: Dian QueenK. Maynard RN 15:15 01/11/17 ([email or web address])    Report Status 01/11/2017 FINAL  Final     Radiological Exams on Admission: Dg Knee Complete 4 Views Left  Result Date: 01/11/2017 CLINICAL DATA:  Pain and swelling beginning yesterday, had injection knee in knee on Monday, today unable to move leg, history diabetes mellitus, hypertension, heart failure EXAM: LEFT KNEE - COMPLETE 4+ VIEW COMPARISON:  None FINDINGS: Osseous demineralization. Tricompartmental  osteoarthritic changes with joint space narrowing and spur formation greatest at patellofemoral joint. No acute fracture, dislocation, or bone destruction. No significant knee joint effusion. Scattered atherosclerotic calcifications. IMPRESSION: Advanced tricompartmental osteoarthritic changes of the LEFT knee. Electronically Signed   By: Ulyses SouthwardMark  Boles M.D.   On: 01/11/2017 10:08    EKG: ordered stat at 3pm and result pending at time of admission  Assessment/Plan Principal Problem:   Arthritis, septic, knee (HCC) Active Problems:   Cardiomyopathy (HCC)   Obesity   Coronary atherosclerosis of native coronary artery   Essential hypertension, benign   #1. Arthritis/septic knee on. X-ray reveals advanced tricompartmental osteoarthritic changes of the left knee. He has a leukocytosis he is afebrile nontoxic appearing with a normal lactic acid. Synovial fluid with bacteria, WBC's and neutrofils. He is provided with Rocephin in the emergency department. Orthopedic surgery consulted and will see patient later today have indicated possibility of OR today as well per ED provider -Admit to MedSurg -Continue Rocephin -Obtain a chest x-ray -PT INR -Follow EKG -Gentle IV fluids -Nothing by mouth -supportive therapy -defer management to ortho  #2. Cardiomyopathy. Appears compensated. Home medications include Coreg, Lasix.  Chart review indicates echo in 2014 within EF 55%. He saw his cardiologist in July of this year at which time he appeared euovolemic. Since then he has been taken off spironolactone due to potassium levels. He's developed lower extremity edema and recently was advised by cardiology to start Lasix 40 mg daily. He denies weight gain shortness of breath.  - follow chest xray -Obtain daily weights -Monitor intake and output -resume lasix tomorrow -resume BB and ramipril tomorrow as blood pressure and heart rate allow  #3. CAD. No chest pain. Chart review indicates branch vessel disease  noted by cath in 2001. No angina. Early follow-up with cardiology in July of this year -Continue home meds when appropriate -Follow EKG results  #4. Hypertension. Blood pressures on the low end of normal. Meds include Lasix, Norvasc,ramapril and coreg -Holding these medications for now as his blood pressures on the low end of normal -Monitor -Resume medications as indicated  #5. Obesity.nutritional consult    DVT prophylaxis: scd  Code Status: full  Family Communication: none present  Disposition Plan: home when ready. May need HH PT  Consults called: ortho graves  Admission status: obs    Toya SmothersBLACK,Agustina Witzke M MD [facility]  If 7PM-7AM, please contact night-coverage  [email or web address] Password [email or web address]  01/11/2017, 4:02 PM

## 2017-01-11 NOTE — Transfer of Care (Signed)
Immediate Anesthesia Transfer of Care Note  Patient: DAVONTAY SOKALSKI  Procedure(s) Performed: ARTHROSCOPIC IRRIGATION AND DEBRIDMENT KNEE (Left Knee) Four compartment SYNOVECTOMY (Left Knee) Lateral MENISECTOMY (Left Knee) CHONDROPLASTY of medial and patella compartment (Left Knee)  Patient Location: PACU  Anesthesia Type:General  Level of Consciousness: awake, alert  and oriented  Airway & Oxygen Therapy: Patient Spontanous Breathing  Post-op Assessment: Report given to RN and Post -op Vital signs reviewed and stable  Post vital signs: Reviewed and stable  Last Vitals:  Vitals:   01/11/17 1330 01/11/17 1413  BP: (!) 106/42   Pulse: (!) 50   Resp: 16   Temp: 36.6 C 37.7 C  SpO2: 95%     Last Pain:  Vitals:   01/11/17 1413  TempSrc: Oral  PainSc:          Complications: No apparent anesthesia complications

## 2017-01-11 NOTE — ED Triage Notes (Addendum)
[facility]- pt coming from home, hx of arthritis in the left knee. Increased pain and decreased ROm X1 week. Pt had cortisone shot in his left knee on Monday and scheduled to have another on 12/3. No injuries noted. Vitals stable with EMS. Ambulatory at baseline. 156/78, HR 54, RR 18.

## 2017-01-11 NOTE — Telephone Encounter (Signed)
Left message for patient to call back  

## 2017-01-12 ENCOUNTER — Encounter (HOSPITAL_COMMUNITY): Payer: Self-pay | Admitting: Orthopedic Surgery

## 2017-01-12 DIAGNOSIS — Z6834 Body mass index (BMI) 34.0-34.9, adult: Secondary | ICD-10-CM

## 2017-01-12 DIAGNOSIS — E871 Hypo-osmolality and hyponatremia: Secondary | ICD-10-CM

## 2017-01-12 DIAGNOSIS — E669 Obesity, unspecified: Secondary | ICD-10-CM

## 2017-01-12 DIAGNOSIS — E782 Mixed hyperlipidemia: Secondary | ICD-10-CM

## 2017-01-12 DIAGNOSIS — D649 Anemia, unspecified: Secondary | ICD-10-CM

## 2017-01-12 DIAGNOSIS — I420 Dilated cardiomyopathy: Secondary | ICD-10-CM

## 2017-01-12 DIAGNOSIS — M009 Pyogenic arthritis, unspecified: Secondary | ICD-10-CM

## 2017-01-12 LAB — CBC
HEMATOCRIT: 37.4 % — AB (ref 39.0–52.0)
Hemoglobin: 11.8 g/dL — ABNORMAL LOW (ref 13.0–17.0)
MCH: 25.4 pg — AB (ref 26.0–34.0)
MCHC: 31.6 g/dL (ref 30.0–36.0)
MCV: 80.6 fL (ref 78.0–100.0)
Platelets: 192 10*3/uL (ref 150–400)
RBC: 4.64 MIL/uL (ref 4.22–5.81)
RDW: 16 % — AB (ref 11.5–15.5)
WBC: 12.5 10*3/uL — ABNORMAL HIGH (ref 4.0–10.5)

## 2017-01-12 LAB — BASIC METABOLIC PANEL
Anion gap: 6 (ref 5–15)
BUN: 12 mg/dL (ref 6–20)
CALCIUM: 8 mg/dL — AB (ref 8.9–10.3)
CO2: 25 mmol/L (ref 22–32)
Chloride: 103 mmol/L (ref 101–111)
Creatinine, Ser: 0.98 mg/dL (ref 0.61–1.24)
GFR calc Af Amer: >60 mL/min (ref >60)
GLUCOSE: 125 mg/dL — AB (ref 65–99)
Potassium: 3.9 mmol/L (ref 3.5–5.1)
Sodium: 134 mmol/L — ABNORMAL LOW (ref 135–145)

## 2017-01-12 LAB — URINALYSIS, ROUTINE W REFLEX MICROSCOPIC
Bacteria, UA: NONE SEEN
Bilirubin Urine: NEGATIVE
GLUCOSE, UA: NEGATIVE mg/dL
KETONES UR: NEGATIVE mg/dL
NITRITE: NEGATIVE
PROTEIN: NEGATIVE mg/dL
Specific Gravity, Urine: 1.014 (ref 1.005–1.030)
pH: 6 (ref 5.0–8.0)

## 2017-01-12 LAB — GLUCOSE, BODY FLUID OTHER

## 2017-01-12 LAB — PHOSPHORUS: PHOSPHORUS: 3.2 mg/dL (ref 2.5–4.6)

## 2017-01-12 LAB — PROTEIN, BODY FLUID (OTHER): Total Protein, Body Fluid Other: 4 g/dL

## 2017-01-12 LAB — MAGNESIUM: MAGNESIUM: 2.1 mg/dL (ref 1.7–2.4)

## 2017-01-12 MED ORDER — VANCOMYCIN HCL 10 G IV SOLR
2000.0000 mg | Freq: Once | INTRAVENOUS | Status: AC
  Administered 2017-01-12: 2000 mg via INTRAVENOUS
  Filled 2017-01-12: qty 2000

## 2017-01-12 MED ORDER — VANCOMYCIN HCL IN DEXTROSE 1-5 GM/200ML-% IV SOLN
1000.0000 mg | Freq: Two times a day (BID) | INTRAVENOUS | Status: DC
  Administered 2017-01-13 – 2017-01-14 (×3): 1000 mg via INTRAVENOUS
  Filled 2017-01-12 (×4): qty 200

## 2017-01-12 NOTE — NC FL2 (Signed)
[place] MEDICAID FL2 LEVEL OF CARE SCREENING TOOL     IDENTIFICATION  Patient Name: Matthew [name] Birthdate: 1940-03-02 Sex: male Admission Date (Current Location): 01/11/2017  [facility] and [place] Number:  Producer, television/film/video and Address:  [facility] [place], [address] [place], [place] [address]      Provider Number: [id number]  Attending Physician Name and Address:  Merlene Laughter, DO  Relative Name and Phone Number:       Current Level of Care: Hospital Recommended Level of Care: Skilled Nursing Facility Prior Approval Number:    Date Approved/Denied:   PASRR Number: [id number]  Discharge Plan: SNF    Current Diagnoses: Patient Active Problem List   Diagnosis Date Noted  . Arthritis, septic, knee (HCC) 01/11/2017  . Hyponatremia 01/11/2017  . Septic arthritis of knee, left (HCC) 01/11/2017  . Septic joint of left knee joint (HCC) 01/11/2017  . Erectile dysfunction 12/08/2015  . Bigeminy 06/11/2015  . Congestive dilated cardiomyopathy (HCC) 12/11/2013  . Cardiomyopathy (HCC) 01/07/2013  . Obesity 01/07/2013  . Coronary atherosclerosis of native coronary artery 01/07/2013  . Mixed hyperlipidemia 01/07/2013  . Essential hypertension, benign 01/07/2013    Orientation RESPIRATION BLADDER Height & Weight     Self, Situation, Time, Place  Normal Continent Weight: 130.6 kg (288 lb) Height:  6\' 4"  (193 cm)  BEHAVIORAL SYMPTOMS/MOOD NEUROLOGICAL BOWEL NUTRITION STATUS      Continent Diet(Please see DC Summary)  AMBULATORY STATUS COMMUNICATION OF NEEDS Skin   Limited Assist Verbally Surgical wounds, Other (Comment)(Closed incision on leg; 2 hemovac drains on knee)                       Personal Care Assistance Level of Assistance  Bathing, Feeding, Dressing Bathing Assistance: Maximum assistance Feeding assistance: Independent Dressing Assistance: Limited assistance     Functional Limitations Info  Sight, Hearing,  Speech Sight Info: Adequate Hearing Info: Adequate Speech Info: Adequate    SPECIAL CARE FACTORS FREQUENCY  PT (By licensed PT)     PT Frequency: 5x/week              Contractures      Additional Factors Info  Code Status, Allergies Code Status Info: Full Allergies Info: NKA           Current Medications (01/12/2017):  This is the current hospital active medication list Current Facility-Administered Medications  Medication Dose Route Frequency Provider Last Rate Last Dose  . acetaminophen (TYLENOL) tablet 650 mg  650 mg Oral Q6H PRN Gwenyth [name], NP       Or  . acetaminophen (TYLENOL) suppository 650 mg  650 mg Rectal Q6H PRN Gwenyth [name], NP      . amLODipine (NORVASC) tablet 10 mg  10 mg Oral Daily Marshia Ly, PA-C   10 mg at 01/12/17 1104  . aspirin tablet 325 mg  325 mg Oral Daily Emokpae, Courage, MD   325 mg at 01/12/17 1103  . atorvastatin (LIPITOR) tablet 10 mg  10 mg Oral Q2000 Marshia Ly, PA-C   10 mg at 01/11/17 2209  . carvedilol (COREG) tablet 25 mg  25 mg Oral BID WC Marshia Ly, PA-C   25 mg at 01/12/17 1103  . cefTRIAXone (ROCEPHIN) 2 g in dextrose 5 % 50 mL IVPB  2 g Intravenous Q24H Mancheril, Candis Schatz, RPH      . furosemide (LASIX) tablet 40 mg  40 mg Oral Daily Marshia Ly,  PA-C   40 mg at 01/12/17 1104  . HYDROcodone-acetaminophen (NORCO/VICODIN) 5-325 MG per tablet 1-2 tablet  1-2 tablet Oral Q6H PRN Marshia LyBethune, James, PA-C   2 tablet at 01/12/17 [id number]  . ondansetron (ZOFRAN) tablet 4 mg  4 mg Oral Q6H PRN Gwenyth BenderBlack, Karen M, NP       Or  . ondansetron [facility](ZOFRAN) injection 4 mg  4 mg Intravenous Q6H PRN Gwenyth BenderBlack, Karen M, NP      . ramipril (ALTACE) capsule 10 mg  10 mg Oral BID Marshia LyBethune, James, PA-C   10 mg at 01/12/17 1104  . senna-docusate (Senokot-S) tablet 1 tablet  1 tablet Oral QHS PRN Gwenyth BenderBlack, Karen M, NP      . traMADol Janean Sark(ULTRAM) tablet 50 mg  50 mg Oral Q6H PRN Marshia LyBethune, James, PA-C   50 mg at 01/12/17 1103  . traZODone (DESYREL) tablet  25 mg  25 mg Oral QHS PRN Gwenyth BenderBlack, Karen M, NP   25 mg at 01/12/17 0000     Discharge Medications: Please see discharge summary for a list of discharge medications.  Relevant Imaging Results:  Relevant Lab Results:   Additional Information SSN: [id number]2505  Caitlyne Ingham S [place], [place]

## 2017-01-12 NOTE — Evaluation (Signed)
Occupational Therapy Evaluation Patient Details Name: Matthew [name] MRN: [id number] DOB: 1941-01-07 Today's Date: 01/12/2017    History of Present Illness Pt is a 76 y/o male with a long history of L knee pain. Pt had an injection ~1 week PTA and presented to the ED with increased pain and swelling in the L knee. L knee was aspirated in the ED and was found to be septic. Pt is now s/p I&D of L knee, 4 compartment synovectomy, L menisectomy, L chondroplasty of medial patella compartment on 01/11/17.    Clinical Impression   Pt admitted with the above diagnoses and presents with below problem list. Pt will benefit from continued acute OT to address the below listed deficits and maximize independence with basic ADLs prior to d/c to next venue. PTA pt was mod I with ADLs. Pt is currently mod-max +2 A for functional transfers and LB ADLs. Chair-side eval this session, ADL assist levels based on chart review, conversation with PT, and clinical judgement.      Follow Up Recommendations  SNF    Equipment Recommendations  Other (comment)(defer to next venue)    Recommendations for Other Services       Precautions / Restrictions Precautions Precautions: Fall;Knee Precaution Booklet Issued: No Precaution Comments: Pt was educated on proper positioning of the knee Restrictions Weight Bearing Restrictions: Yes LLE Weight Bearing: Weight bearing as tolerated      Mobility Bed Mobility Overal bed mobility: Needs Assistance Bed Mobility: Supine to Sit     Supine to sit: Min assist     General bed mobility comments: up in chair  Transfers Overall transfer level: Needs assistance Equipment used: Rolling Overbeck (2 wheeled)(Bariatric RW used) Transfers: Sit to/from UGI CorporationStand;Stand Pivot Transfers Sit to Stand: Mod assist;+2 physical assistance;From elevated surface Stand pivot transfers: Mod assist;+2 physical assistance;+2 safety/equipment;From elevated surface       General transfer  comment: VC's for hand placement on seated surface for safety. Pt was able to power-up to full stand with +2 mod assist for balance support and safety with the RW. Pt with increased difficulty advancing the LLE, and noted overall knee flexion bilaterally in standing.     Balance Overall balance assessment: Needs assistance Sitting-balance support: Feet supported;No upper extremity supported Sitting balance-[name] Scale: Fair     Standing balance support: Bilateral upper extremity supported;During functional activity Standing balance-[name] Scale: Zero Standing balance comment: +2 required.                            ADL either performed or assessed with clinical judgement   ADL Overall ADL's : Needs assistance/impaired Eating/Feeding: Set up;Sitting   Grooming: Set up;Sitting   Upper Body Bathing: Set up;Sitting   Lower Body Bathing: Maximal assistance;+2 for physical assistance;Sit to/from stand   Upper Body Dressing : Set up;Sitting   Lower Body Dressing: Maximal assistance;+2 for physical assistance;Sit to/from stand   Toilet Transfer: Moderate assistance;+2 for physical assistance;RW;Stand-pivot   Toileting- Clothing Manipulation and Hygiene: Moderate assistance;+2 for physical assistance;Sit to/from stand   Tub/ Engineer, structuralhower Transfer: Moderate assistance;+2 for physical assistance;Stand-pivot;3 in 1;Rolling Chio     General ADL Comments: Educated on LB ADL techniques. LB and OOB ADLs based on clhart review and clinical judgement.      Vision         Perception     Praxis      Pertinent Vitals/Pain Pain Assessment: Faces Faces Pain Scale: Hurts a little bit  Pain Location: L knee Pain Descriptors / Indicators: Discomfort Pain Intervention(s): Monitored during session     Hand Dominance Right   Extremity/Trunk Assessment Upper Extremity Assessment Upper Extremity Assessment: RUE deficits/detail;LUE deficits/detail RUE Deficits / Details: shoulder  flexion/abd to about 90*, arthritic LUE Deficits / Details: shoulder flexion/abd to about 90*, arthritic   Lower Extremity Assessment Lower Extremity Assessment: Defer to PT evaluation LLE Deficits / Details: Decreased strength and AROM consistent with above mentioned procedures LLE: Unable to fully assess due to pain   Cervical / Trunk Assessment Cervical / Trunk Assessment: Other exceptions Cervical / Trunk Exceptions: Pt very flexed with difficulty maintaining upright posture. Pt is 6'7".   Communication Communication Communication: HOH   Cognition Arousal/Alertness: Awake/alert Behavior During Therapy: WFL for tasks assessed/performed Overall Cognitive Status: Within Functional Limits for tasks assessed                                     General Comments       Exercises   Shoulder Instructions      Home Living Family/patient expects to be discharged to:: Private residence Living Arrangements: Children Available Help at Discharge: Family;Available PRN/intermittently Type of Home: House Home Access: Stairs to enter [employer] of Steps: 4 Entrance Stairs-Rails: Right Home Layout: One level     Bathroom Shower/Tub: Producer, television/film/video: Standard Bathroom Accessibility: Yes   Home Equipment: Environmental consultant - 4 wheels;Arvidson - 2 wheels;Cane - single point          Prior Functioning/Environment Level of Independence: Independent with assistive device(s)        Comments: Occasionally used the RW but used the cane most of the time.        OT Problem List: Impaired balance (sitting and/or standing);Decreased knowledge of use of DME or AE;Decreased knowledge of precautions;Pain      OT Treatment/Interventions: Self-care/ADL training;Therapeutic exercise;DME and/or AE instruction;Energy conservation;Therapeutic activities;Patient/family education;Balance training    OT Goals(Current goals can be found in the care plan section)  Acute Rehab OT Goals Patient Stated Goal: Get more independent OT Goal Formulation: With patient Time For Goal Achievement: 01/19/17 Potential to Achieve Goals: Good ADL Goals Pt Will Perform Lower Body Bathing: with min assist;sit to/from stand Pt Will Perform Lower Body Dressing: with min assist;sit to/from stand Pt Will Transfer to Toilet: with min assist Pt Will Perform Toileting - Clothing Manipulation and hygiene: with min assist;sit to/from stand  OT Frequency: Min 2X/week   Barriers to D/C:            Co-evaluation              AM-PAC PT "6 Clicks" Daily Activity     Outcome Measure Help from another person eating meals?: None Help from another person taking care of personal grooming?: None Help from another person toileting, which includes using toliet, bedpan, or urinal?: A Lot Help from another person bathing (including washing, rinsing, drying)?: A Lot Help from another person to put on and taking off regular upper body clothing?: A Little Help from another person to put on and taking off regular lower body clothing?: A Lot 6 Click Score: 17   End of Session    Activity Tolerance:   Patient left: in chair;with call bell/phone within reach  OT Visit Diagnosis: Unsteadiness on feet (R26.81);Pain  Time: 8185-9093 OT Time Calculation (min): 10 min Charges:  OT General Charges $OT Visit: 1 Visit OT Evaluation $OT Eval Low Complexity: 1 Low G-Codes:      Pilar Grammes 01/12/2017, 1:26 PM

## 2017-01-12 NOTE — Progress Notes (Signed)
Subjective: 1 Day Post-Op Procedure(s) (LRB): ARTHROSCOPIC IRRIGATION AND DEBRIDMENT KNEE (Left) Four compartment SYNOVECTOMY (Left) Lateral MENISECTOMY (Left) CHONDROPLASTY of medial and patella compartment (Left) Patient reports pain as 1 on 0-10 scale.  Patient reports that his left knee feels much better. He has been out of bed to the chair today. He is taking by mouth and voiding okay. He has no significant left knee pain today. Overall feels better.  Objective: Vital signs in last 24 hours: Temp:  [97.7 F (36.5 C)-98.6 F (37 C)] 98.6 F (37 C) (11/30 1532) Pulse Rate:  [72-80] 79 (11/30 1532) Resp:  [10-18] 16 (11/30 1532) BP: (94-126)/(36-68) 94/36 (11/30 1532) SpO2:  [97 %-99 %] 98 % (11/30 1532) Weight:  [130.6 kg (288 lb)] 130.6 kg (288 lb) (11/29 1705)  Intake/Output from previous day: 11/29 0701 - 11/30 0700 In: 970 [P.O.:120; I.V.:800; IV Piggyback:50] Out: 1120 [Urine:1100; Blood:20] Intake/Output this shift: Total I/O In: 120 [P.O.:120] Out: -   Recent Labs    01/11/17 1036 01/12/17 0432  HGB 13.1 11.8*   Recent Labs    01/11/17 1036 01/12/17 0432  WBC 11.2* 12.5*  RBC 4.91 4.64  HCT 39.8 37.4*  PLT 209 192   Recent Labs    01/11/17 1036 01/12/17 0432  NA 133* 134*  K 4.2 3.9  CL 100* 103  CO2 25 25  BUN 16 12  CREATININE 1.04 0.98  GLUCOSE 103* 125*  CALCIUM 8.6* 8.0*   Preliminary left knee synovial fluid cultures show staph aureus. Left knee exam: 2 Hemovac drains that are sewn in are in place. He has serous sanguinous drainage from them. His dressing is clean and dry overall. NV is intact distally.  Assessment/Plan: 1 Day Post-Op Procedure(s) (LRB): ARTHROSCOPIC IRRIGATION AND DEBRIDMENT KNEE (Left) Four compartment SYNOVECTOMY (Left) Lateral MENISECTOMY (Left) CHONDROPLASTY of medial and patella compartment (Left) Septic arthritis left knee. Cultures still pending,but preliminary cultures have shown staph aureus. Plan: We  appreciate the hospitalist's and infectious disease team's help. Continue preliminary IV antibiotics until cultures mature. He may weight-bear as tolerated on his left lower extremity.  Aspirin 325 mg daily with SCDs for DVT prophylaxis. Encourage him to be out of bed to the chair. Dr. Ave Filter and his PA Jiles Harold will be covering over weekend. He will need his Hemovac drains which are sewn in pulled on Sunday. I talked about skilled nursing facility for the patient he lives with his son and says he will consider it, but he thinks he will do fine at home. He said that he would think about it. Will probably need a PICC line placed. We will await the infectious disease recommendations regarding antibiotics and length of therapy.  Treyvonne Tata G 01/12/2017, 3:53 PM

## 2017-01-12 NOTE — Evaluation (Signed)
Physical Therapy Evaluation Patient Details Name: Matthew AmisClifton J Solar MRN: [id number] DOB: 05/19/1940 Today's Date: 01/12/2017   History of Present Illness  Pt is a 76 y/o male with a long history of L knee pain. Pt had an injection ~1 week PTA and presented to the ED with increased pain and swelling in the L knee. L knee was aspirated in the ED and was found to be septic. Pt is now s/p I&D of L knee, 4 compartment synovectomy, L menisectomy, L chondroplasty of medial patella compartment on 01/11/17.   Clinical Impression  Pt admitted with above diagnosis. Pt currently with functional limitations due to the deficits listed below (see PT Problem List). At the time of PT eval pt was able to perform transfers with gross +2 mod assist for balance support and safety. Pt grossly flexed with difficulty achieving upright posture. Discussed the recommendation of continued rehab prior to return home and pt enthusiastically agreeable. Pt will benefit from skilled PT to increase their independence and safety with mobility to allow discharge to the venue listed below.       Follow Up Recommendations SNF;Supervision/Assistance - 24 hour    Equipment Recommendations  None recommended by PT(TBD by next venue of care)    Recommendations for Other Services       Precautions / Restrictions Precautions Precautions: Fall;Knee Precaution Booklet Issued: No Precaution Comments: Pt was educated on proper positioning of the knee Restrictions Weight Bearing Restrictions: Yes LLE Weight Bearing: Weight bearing as tolerated      Mobility  Bed Mobility Overal bed mobility: Needs Assistance Bed Mobility: Supine to Sit     Supine to sit: Min assist     General bed mobility comments: Increased time required for all aspects of bed mobility. Pt was able to advance LE's to EOB (assist required for LLE), and elevate trunk into full sitting position. Bed pad used to complete scooting activity towards EOB.    Transfers Overall transfer level: Needs assistance Equipment used: Rolling Boltz (2 wheeled)(Bariatric RW used) Transfers: Sit to/from UGI CorporationStand;Stand Pivot Transfers Sit to Stand: Mod assist;+2 physical assistance;From elevated surface Stand pivot transfers: Mod assist;+2 physical assistance;+2 safety/equipment;From elevated surface       General transfer comment: VC's for hand placement on seated surface for safety. Pt was able to power-up to full stand with +2 mod assist for balance support and safety with the RW. Pt with increased difficulty advancing the LLE, and noted overall knee flexion bilaterally in standing.   Ambulation/Gait             General Gait Details: Unable at this time.   Stairs            Wheelchair Mobility    Modified Rankin (Stroke Patients Only)       Balance Overall balance assessment: Needs assistance Sitting-balance support: Feet supported;No upper extremity supported Sitting balance-[name] Scale: Fair     Standing balance support: Bilateral upper extremity supported;During functional activity Standing balance-[name] Scale: Zero Standing balance comment: +2 required.                              Pertinent Vitals/Pain Pain Assessment: Faces Faces Pain Scale: Hurts even more Pain Location: L knee Pain Descriptors / Indicators: Operative site guarding;Discomfort Pain Intervention(s): Limited activity within patient's tolerance;Monitored during session;Repositioned    Home Living Family/patient expects to be discharged to:: Private residence Living Arrangements: Children Available Help at Discharge: Family;Available PRN/intermittently Type of  Home: House Home Access: Stairs to enter Entrance Stairs-Rails: Right Entrance Stairs-Number of Steps: 4 Home Layout: One level Home Equipment: Zellers - 4 wheels;Shatzer - 2 wheels;Cane - single point      Prior Function Level of Independence: Independent with assistive device(s)          Comments: Occasionally used the RW but used the cane most of the time.     Hand Dominance        Extremity/Trunk Assessment   Upper Extremity Assessment Upper Extremity Assessment: Defer to OT evaluation    Lower Extremity Assessment Lower Extremity Assessment: LLE deficits/detail LLE Deficits / Details: Decreased strength and AROM consistent with above mentioned procedures LLE: Unable to fully assess due to pain    Cervical / Trunk Assessment Cervical / Trunk Assessment: Other exceptions Cervical / Trunk Exceptions: Pt very flexed with difficulty maintaining upright posture. Pt is 6'7".  Communication   Communication: HOH  Cognition Arousal/Alertness: Awake/alert Behavior During Therapy: WFL for tasks assessed/performed Overall Cognitive Status: Within Functional Limits for tasks assessed                                        General Comments      Exercises General Exercises - Lower Extremity Ankle Circles/Pumps: 10 reps Quad Sets: 10 reps Long Arc Quad: 10 reps;AAROM   Assessment/Plan    PT Assessment Patient needs continued PT services  PT Problem List Decreased strength;Decreased range of motion;Decreased activity tolerance;Decreased balance;Decreased mobility;Decreased knowledge of use of DME;Decreased safety awareness;Decreased knowledge of precautions;Pain       PT Treatment Interventions DME instruction;Gait training;Stair training;Functional mobility training;Therapeutic activities;Therapeutic exercise;Neuromuscular re-education;Patient/family education    PT Goals (Current goals can be found in the Care Plan section)  Acute Rehab PT Goals Patient Stated Goal: Get more independent PT Goal Formulation: With patient/family Time For Goal Achievement: 01/19/17 Potential to Achieve Goals: Good    Frequency Min 3X/week   Barriers to discharge        Co-evaluation               AM-PAC PT "6 Clicks" Daily Activity   Outcome Measure Difficulty turning over in bed (including adjusting bedclothes, sheets and blankets)?: Unable Difficulty moving from lying on back to sitting on the side of the bed? : Unable Difficulty sitting down on and standing up from a chair with arms (e.g., wheelchair, bedside commode, etc,.)?: Unable Help needed moving to and from a bed to chair (including a wheelchair)?: A Lot Help needed walking in hospital room?: A Lot Help needed climbing 3-5 steps with a railing? : Total 6 Click Score: 8    End of Session Equipment Utilized During Treatment: Gait belt Activity Tolerance: Patient limited by fatigue Patient left: in chair;with call bell/phone within reach;with chair alarm set;with family/visitor present Nurse Communication: Mobility status PT Visit Diagnosis: Unsteadiness on feet (R26.81);Pain;Difficulty in walking, not elsewhere classified (R26.2) Pain - Right/Left: Left Pain - part of body: Knee    Time: 1006-1036 PT Time Calculation (min) (ACUTE ONLY): 30 min   Charges:   PT Evaluation $PT Eval Moderate Complexity: 1 Mod PT Treatments $Gait Training: 8-22 mins   PT G Codes:        Conni Slipper, PT, DPT Acute Rehabilitation Services Pager: [phone]   Marylynn Pearson 01/12/2017, 11:44 AM

## 2017-01-12 NOTE — Clinical Social Work Note (Signed)
Clinical Social Work Assessment  Patient Details  Name: Matthew [name] MRN: [id number] Date of Birth: 01/24/1941  Date of referral:  01/12/17               Reason for consult:  Facility Placement                Permission sought to share information with:  Facility Medical sales representative, Family Supports Permission granted to share information::  Yes, Verbal Permission Granted  Name::     Investment banker, operational::  SNFs  Relationship::  Daughter  Contact Information:  [phone]  Housing/Transportation Living arrangements for the past 2 months:  Single Family Home Source of Information:  Patient, Adult Children Patient Interpreter Needed:  None Criminal Activity/Legal Involvement Pertinent to Current Situation/Hospitalization:  No - Comment as needed Significant Relationships:  Adult Children Lives with:  Adult Children Do you feel safe going back to the place where you live?  No Need for family participation in patient care:  No (Coment)  Care giving concerns:  CSW received consult for possible SNF placement at time of discharge. CSW spoke with patient and patient's son at bedside regarding PT recommendation of SNF placement at time of discharge. Patient reported that patient's children work and are currently unable to care for patient at their home given patient's current physical needs and fall risk. Patient expressed understanding of PT recommendation and is agreeable to SNF placement at time of discharge. CSW to continue to follow and assist with discharge planning needs.   Social Worker assessment / plan:  CSW spoke with patient concerning possibility of rehab at [facility] before returning home.  Employment status:  Retired Health and safety inspector:  Medicare PT Recommendations:  Skilled Nursing Facility Information / Referral to community resources:  Skilled Nursing Facility  Patient/Family's Response to care:  Patient recognizes need for rehab before returning home and is agreeable to a  SNF in [place]. Patient reported preference for [facility].  Patient/Family's Understanding of and Emotional Response to Diagnosis, Current Treatment, and Prognosis:  Patient/family is realistic regarding therapy needs and expressed being hopeful for SNF placement. Patient expressed understanding of CSW role and discharge process as well as medical condition. No questions/concerns about plan or treatment.    Emotional Assessment Appearance:  Appears stated age Attitude/Demeanor/Rapport:  Other(Appropriate) Affect (typically observed):  Accepting, Appropriate Orientation:  Oriented to Self, Oriented to Situation, Oriented to Place, Oriented to  Time Alcohol / Substance use:  Not Applicable Psych involvement (Current and /or in the community):  No (Comment)  Discharge Needs  Concerns to be addressed:  Care Coordination Readmission within the last 30 days:  No Current discharge risk:  None Barriers to Discharge:  Continued Medical Work up   Matthew [name], LCSWA 01/12/2017, 4:56 PM

## 2017-01-12 NOTE — Op Note (Signed)
NAME:  Matthew [name], Matthew [name]              ACCOUNT NO.:  [id number]  MEDICAL RECORD NO.:  [id number]  LOCATION:  [facility]                         FACILITY:  [facility]  PHYSICIAN:  John L. Graves, M.D.   DATE OF BIRTH:  06/18/1940  DATE OF PROCEDURE:  01/11/2017 DATE OF DISCHARGE:                              OPERATIVE REPORT   POSTOPERATIVE DIAGNOSIS:  Infected left knee.  POSTOPERATIVE DIAGNOSIS:  Infected left knee with chondromalacia, medial, lateral, and patellofemoral with severe synovitis, medial and 4 compartments and lateral meniscal tear.  PROCEDURES: 1. Partial lateral meniscectomy. 2. Chondroplasty medial, lateral, and patellofemoral. 3. Four-compartment synovectomy.  SURGEON:  John L. Graves, MD.  ASSISTANT:  James Bethune, PA.  ANESTHESIA:  General.  BRIEF HISTORY:  [name] is a 76-year-old male with a long history and significant complaints of left knee pain over the last 48 hours.  His pain has just been worsening.  He was noted to have severe patellofemoral disease and we treated it for many years with injections into the knee.  Unfortunately, he began having pain and inability to stand and walk and because of severe pain and inability to stand and walk, he presented to the emergency room.  Aspiration was undertaken, which had greater than 60,000 white cells.  He had a high ESR and because of his pain, swelling, and failure to get better, we felt that he needed I and D.  He is brought to the operating room for this procedure.  DESCRIPTION OF PROCEDURE:  The patient was brought to the operative room and after adequate anesthesia was obtained with general anesthetic, the patient was placed supine on the operating room.  The left leg was prepped and draped in usual sterile fashion.  Following this, routine arthroscopic examination of the knee revealed there was severe synovitis in the anterior, medial, lateral, and superior compartments.  Four- compartment synovectomy  was performed.  He had chondromalacia of severe nature throughout the patellofemoral compartment, medial compartment, and lateral compartment and this was debrided thoroughly.  There was weird film over the articular cartilage that we had to probe off and then use a shaver and large portions of articular cartilage unfortunately were coming off with this, weird sort of film over the articular cartilage.  He had a lateral meniscal tear, which was debrided.  Medial meniscus looked reasonable.  There was some chondromalacia in medial and lateral tibial plateau.  At this point, the knee was scoped and thoroughly lavaged with 9 L throughout the course of the case.  We made a superior portal to be right down the anterior portion of the distal femur.  This was debrided and then the knee was irrigated and suctioned dry.  Two medium large Hemovac drains were placed into the knee and sewn in place and connected to a reservoir.  At this point, the sterile compressive dressing was applied and the patient was taken to the recovery room where he was noted to be in satisfactory condition.  He was given vancomycin as soon as we had taken cultures in the operating room.  I will consult Infectious Disease and they will help us to guide the duration and type of antibiotic therapy.       Alta Corning, M.D.     Corliss Skains  D:  01/11/2017  T:  01/12/2017  Job:  [id number]

## 2017-01-12 NOTE — Consult Note (Signed)
[facility]    Date of Admission:  01/11/2017     Total days of antibiotics 1  Ceftriaxone 11/29  >> Vancomycin 11/29 >>             Reason for Consult: Joint Infection  Referring Provider: Dr. [name] Primary Care Provider: [name], MD   Assessment/Plan: 76 year old male with PMH of CAD, HTN and osteoarthritis admitted on 11/29 and found to have left knee septic arthritis with synovial fluid WBC of 71,500. Status post four compartment synovectomy, chondroplasty and partial lateral menisectomy with Hemovac drain placement. Empirically covered with Ceftriaxone and vancomycin. Cultures remain pending with gram stain result of gram positive cocci in clusters.  Septic Arthritis - Feeling better since admission. Tolerating antibiotics. Hemovac with sanguinous drainage and patent. Cultures remain pending with gram positive cocci. Will discontinue ceftriaxone for gram negative coverage and continue vancomycin pending culture results.  Will require PICC and extended antibiotic treatment.    Therapeutic Drug monitoring - Kidney function normal. Continue to monitor while on vancomycin.     Principal Problem:   Septic joint of left knee joint ([place]) Active Problems:   Cardiomyopathy ([place])   Obesity   Coronary atherosclerosis of native coronary artery   Essential hypertension, benign   Arthritis, septic, knee (HCC)   Hyponatremia   Septic arthritis of knee, left ([place])   . amLODipine  10 mg Oral Daily  . aspirin  325 mg Oral Daily  . atorvastatin  10 mg Oral Q2000  . carvedilol  25 mg Oral BID WC  . furosemide  40 mg Oral Daily  . ramipril  10 mg Oral BID     HPI: Matthew [name] is a 76 y.o. male with PMH of CHF, CAD, HTN and osteoarthritis admitted to the hospital on 11/29 with the chief complain of left knee pain and swelling. He is a well-known patient of Dr. [name] previously diagnosed with severe patellofemoral arthritis receiving  multiple cortisone injections over the years with the recommendation for knee replacement. He received an injection about 1 week prior to presentation in the ED without complication. Following the injection he experienced increasing pain and swelling with a severity enough to effect his ability to stand and walk.  In the ED he was noted to have left knee edema and warmth with decreased range of motion and no wounds or skin breakdown. He was afebrile.Inflammatory markers were elevated with CRP of 19.4 and ESR of 60. His WBC was 11.2. X-ray of the left knee showed no bone distruction with advanced tricompartmental osteoarthritic changes.  His knee was drained with synovial fluid cell count showing WBC of 63,750 and a neutrophil count of 88. He was started on ceftriazone. Dr. [name] found severe synovitis in the anterior, medial, lateral and superior compartments. He noted a film over the articular cartilage which was removed with a shaver. The knee was thoroughly lavaged with 9 L. Two medium/large Hemovac drains were placed. Cultures were obtained during the procedure.   He has remained afebrile. Hemovac drains are place and patent. Overall feeling improved today with less pressure and pain.   Review of Systems: Review of Systems  Constitutional: Negative for chills and fever.  Respiratory: Negative for cough, shortness of breath and wheezing.   Cardiovascular: Negative for chest pain.  Musculoskeletal: Positive for joint pain.  Skin: Negative for rash.  Neurological: Negative for weakness.     Past Medical History:  Diagnosis Date  .  CAD (coronary artery disease)   . Cardiomyopathy ([place])   . Heart failure ([place])   . HTN (hypertension)   . Obesity   . Osteoarthritis     Social History   Tobacco Use  . Smoking status: Never Smoker  . Smokeless tobacco: Never Used  Substance Use Topics  . Alcohol use: No  . Drug use: No    Family History  Problem Relation Age of Onset  .  Hypertension Father   . Diabetes Brother   . Heart attack Neg Hx   . Stroke Neg Hx     No Known Allergies  OBJECTIVE: Blood pressure (!) 126/54, pulse 76, temperature 98.6 F (37 C), temperature source Oral, resp. rate 16, height 6' 4" (1.93 m), weight 288 lb (130.6 kg), SpO2 97 %.  Physical Exam  Constitutional: He is oriented to person, place, and time and well-developed, well-nourished, and in no distress. No distress.  Pleasant and in good spirits. Seated in the chair with legs elevated.  Cardiovascular: Normal rate, regular rhythm, normal heart sounds and intact distal pulses. Exam reveals no gallop and no friction rub.  No murmur heard. Pulmonary/Chest: Breath sounds normal. No respiratory distress. He has no wheezes. He has no rales. He exhibits no tenderness.  Musculoskeletal:  Left knee - Surgical bandage in place is clean, dry and intact. There is edema of the left knee and distal lower extremity. Pulses and sensation are intact and appropriate. Hemovac are in place and patent with primarily sanguinous drainage.  Neurological: He is alert and oriented to person, place, and time.  Skin: Skin is warm and dry. No rash noted.  Psychiatric: Mood, memory, affect and judgment normal.    Lab Results Lab Results  Component Value Date   WBC 12.5 (H) 01/12/2017   HGB 11.8 (L) 01/12/2017   HCT 37.4 (L) 01/12/2017   MCV 80.6 01/12/2017   PLT 192 01/12/2017    Lab Results  Component Value Date   CREATININE 0.98 01/12/2017   BUN 12 01/12/2017   NA 134 (L) 01/12/2017   K 3.9 01/12/2017   CL 103 01/12/2017   CO2 25 01/12/2017    Lab Results  Component Value Date   ALT 11 08/21/2016   AST 16 08/21/2016   ALKPHOS 112 08/21/2016   BILITOT 0.4 08/21/2016     Microbiology: Recent Results (from the past 240 hour(s))  Culture, body fluid-bottle     Status: None (Preliminary result)   Collection Time: 01/11/17 11:26 AM  Result Value Ref Range Status   Specimen Description  FLUID LEFT KNEE  Final   Special Requests NONE  Final   Gram Stain   Final    GRAM POSITIVE COCCI IN CLUSTERS IN BOTH AEROBIC AND ANAEROBIC BOTTLES    Culture CULTURE REINCUBATED FOR BETTER GROWTH  Final   Report Status PENDING  Incomplete  Gram stain     Status: None   Collection Time: 01/11/17 11:26 AM  Result Value Ref Range Status   Specimen Description FLUID LEFT KNEE  Final   Special Requests NONE  Final   Gram Stain   Final    ABUNDANT WBC PRESENT,BOTH PMN AND MONONUCLEAR MODERATE GRAM POSITIVE COCCI IN PAIRS CRITICAL RESULT CALLED TO, READ BACK BY AND VERIFIED WITH: Gilberto Better RN 15:15 01/11/17 ([email or web address])    Report Status 01/11/2017 FINAL  Final  Aerobic/Anaerobic Culture (surgical/deep wound)     Status: None (Preliminary result)   Collection Time: 01/11/17  6:39 PM  Result Value Ref  Range Status   Specimen Description FLUID SYNOVIAL LEFT KNEE  Final   Special Requests NONE  Final   Gram Stain   Final    ABUNDANT WBC PRESENT, PREDOMINANTLY PMN FEW GRAM POSITIVE COCCI IN CLUSTERS    Culture CULTURE REINCUBATED FOR BETTER GROWTH  Final   Report Status PENDING  Incomplete     Terri Piedra, NP [facility] [phone] Pager  01/12/2017  11:15 AM

## 2017-01-12 NOTE — Progress Notes (Signed)
Pharmacy Antibiotic Note  Matthew [name] is a 76 y.o. male admitted on 01/11/2017 with septic joint and started on Rocephin.  Now growing preliminary Staph aureus on left knee fluid culture.  Pharmacy has been consulted for vancomycin dosing.  Patient underwent meniscectomy and synovectomy on 01/12/17.  Noted patient received vancomycin 1gm IV on 01/11/17 around 1830.  Patient's renal function is stable.  He is afebrile and his WBC is increasing.  Plan: Vanc 2gm IV x 1, then 1gm IV Q12H Monitor renal fxn, micro data, vanc trough at Css  Height: 6\' 4"  (193 cm) Weight: 288 lb (130.6 kg) IBW/kg (Calculated) : 86.8  Temp (24hrs), Avg:98.2 F (36.8 C), Min:97.7 F (36.5 C), Max:98.6 F (37 C)  Recent Labs  Lab 01/11/17 1036 01/11/17 1407 01/12/17 0432  WBC 11.2*  --  12.5*  CREATININE 1.04  --  0.98  LATICACIDVEN  --  1.59  --     Estimated Creatinine Clearance: 94.6 mL/min (by C-G formula based on SCr of 0.98 mg/dL).    No Known Allergies   Vanc 11/30 >> CTX 11/29 >> 11/30  11/29 left knee synovial fluid - GPC 11/29 left knee fluid - Staph aureus (preliminary)   Mysty Kielty D. Laney Potash, PharmD, BCPS Pager:  [phone] 01/12/2017, 3:13 PM

## 2017-01-12 NOTE — Progress Notes (Signed)
PROGRESS NOTE    Matthew [name]  APO:[id number] DOB: 02/29/1940 DOA: 01/11/2017 PCP: [name], MD  Brief Narrative:  Matthew [name] is a very pleasant 76 y.o. male with medical history significant  obesity, lower extremity edema, cardiomyopathy, COPD and other comorbids who presents to the emergency Department chief complaint of left knee pain and swelling. Initial evaluation is concerning for septic knee. ED provider discussed case with orthopedics who recommended a medical admission w with possible OR this afternoon.   Patient states he has chronic knee pain and received cortisone injection the most recent being 4 days ago. Since that time he had gradual swelling and decreased range of motion and increased pain in that left knee. This morning he was unable to walk/bear weight on that knee nor bend it at all. He denies any headache fever chills nausea vomiting diarrhea. He denies chest pain palpitation but does endorse some lower extremity edema since he was taken off of his spironolactone 7 weeks ago. He was given Lasix but reports this does not control the swelling as well. Today he indicates that the swelling in his left foot is "close to its usual". Was worked up for Septic Left knee and taken to the OR for I and D. Orthopedics following and consulted Infectious Diseases for further Abx Recommendations. PT Evaluated and recommending patient go to SNF.  Assessment & Plan:   Principal Problem:   Septic joint of left knee joint (HCC) Active Problems:   Cardiomyopathy ([place])   Obesity   Coronary atherosclerosis of native coronary artery   Mixed hyperlipidemia   Essential hypertension, benign   Arthritis, septic, knee (HCC)   Hyponatremia   Septic arthritis of knee, left (HCC)   Normocytic anemia  Patellofemoral Arthritis/ Left Septic Knee s/p Arthroscopic I and D, Four Compartment Synovectomy, Lateral Menisectomy, and Chondroplast of the Medial Patella Compartment POD  1 -Recent Left Knee Injection approximately 1 week ago -X-ray reveals advanced tricompartmental osteoarthritic changes of the left knee.  -He has a leukocytosis he is afebrile nontoxic appearing with a normal lactic acid. WBC went from 11.2 -> 12.5 -CRP was 19.4 and ESR was 60 -Synovial Fluid showed Turbid Appearance with 71,500 WBC, 94 Neutrophils; Gram Stain showed ABUNDANT WBC PRESENT,BOTH PMN AND MONONUCLEAR and MODERATE GRAM POSITIVE COCCI IN PAIRS; Cx re-incubated for better Growth  -Placed on IV Ceftriaxone; Will continue for now; IV Vancomycin was given in OR for Pre-Op -Infectious Diseases Consulted and will defer further Abx Treatment to them  -Orthopedic Surgery Consulted and took the patient to the OR 11/29 -Admitted to [place] obtained and showed Low lung volumes.  No acute cardiopulmonary abnormality identified. -Given IV Bolus of 500 mL yesterday and IVF now stopped -Continue to Monitor and Appreciate Orthopedic And Infectious Disease Recommendations -PT Evaluate and Treat -> Recommending SNF -C/w Pain Control with Hydrocodone-Acetaminophen 1-2 tab po q6hprn and with Tramadol 50 mg po q6hprn  -C/w Bowel Regimen with Senna-Docusate 1 tab po qHSprn  Cardiomyopathy/HFpEF/Chronic Diastolic CHF -Appears Compensated.  -Home medications include Lasix 40 mg po Daily, Ramapril 10 mg po BID and Coreg 25 mg po BID.   -Chart review indicates echo in 2014 within EF 55%. He saw his cardiologist in July of this year at which time he appeared euovolemic. Since then he has been taken off spironolactone due to potassium levels. He's developed lower extremity edema and recently was advised by cardiology to start Lasix 40 mg daily. He denies weight gain shortness of breath.  -  Strict I's/O's and Daily Weights; Patient is -30 mL since Admit -Daily weight not done  -C/w Home Meds as above  CAD -No chest pain.  -Chart review indicates branch vessel disease noted by cath in 2001. No angina.  Early follow-up with cardiology in July of this year -Continue Home Medications of ASA 325 mg po Daily, Carvedilol 25 mg po BID, Ramipril 10 mg po BID, and Atorvastatin 10 mg po Daily  -Follow EKG results  Hypertension -Blood pressures on the low end of normal.  -Meds include Lasix 40 mg po Daily, Amlodipine of 10 mg po Daily, Ramapril 10 mg po BID and Coreg 25 mg po BID -Held medications initially as his blood pressures on the low end of normal but now resumed -Continue to Monitor   Obesity  -BMI of 35.07 -Weight Loss Counseling given  -Nutritional Consult appreciated   Normocytic Anemia -Patient's Hb/Hct went from 13.1/39.8 -> 11.8/37.4 -Continue to Monitor for S/Sx of Bleeding -Repeat CBC in AM   Hyponatremia -Mild at 134 -Continue to Monitor   HLD -C/w Atorvastatin 10 mg po qHS  DVT prophylaxis: SCD's as just had knee I&D. Defer to Ortho to restart Pharmacological DVT Prophylaxis  Code Status: FULL CODE Family Communication: Discussed with daughter at bedside Disposition Plan: Remain Inpatient for further workup; SNF when medically stable for Discharge  Consultants:   Orthopedic Surgery Dr. [name]  Infectious Diseases   Procedures:  ARTHROSCOPIC IRRIGATION AND DEBRIDMENT KNEE (Left) Four compartment SYNOVECTOMY (Left) Lateral MENISECTOMY (Left) CHONDROPLASTY of medial patella compartment (Left)   Antimicrobials:  Anti-infectives (From admission, onward)   Start     Dose/Rate Route Frequency Ordered Stop   01/12/17 1400  cefTRIAXone (ROCEPHIN) 2 g in dextrose 5 % 50 mL IVPB     2 g 100 mL/hr over 30 Minutes Intravenous Every 24 hours 01/11/17 1912     01/12/17 0600  vancomycin (VANCOCIN) IVPB 1000 mg/200 mL premix     1,000 mg 200 mL/hr over 60 Minutes Intravenous On call to O.R. 01/11/17 1746 01/11/17 1932   01/11/17 1748  vancomycin (VANCOCIN) 1-5 GM/200ML-% IVPB    Comments:  Schonewitz, Leigh   : cabinet override      01/11/17 1748 01/11/17 1832    01/11/17 1330  cefTRIAXone (ROCEPHIN) 2 g in dextrose 5 % 50 mL IVPB     2 g 100 mL/hr over 30 Minutes Intravenous  Once 01/11/17 1326 01/11/17 1445     Subjective: Seen and examined this AM sitting in chair with knee propped up in no acute distress. No CP or SOB. States his pain is ok. No other concerns or complaints at this time.   Objective: Vitals:   01/11/17 2000 01/11/17 2015 01/12/17 0626 01/12/17 1103  BP: 120/65 113/62 125/66 (!) 126/54  Pulse: 76  76   Resp: 18  16   Temp:  98.2 F (36.8 C) 98.6 F (37 C)   TempSrc:   Oral   SpO2: 98%  97%   Weight:      Height:        Intake/Output Summary (Last 24 hours) at 01/12/2017 1334 Last data filed at 01/12/2017 1300 Gross per 24 hour  Intake 1090 ml  Output 1120 ml  Net -30 ml   Filed Weights   01/11/17 1705  Weight: 130.6 kg (288 lb)   Examination: Physical Exam:  Constitutional: WN/WD obese AAM in NAD and appears calm and comfortable Eyes: Lids and conjunctivae normal, sclerae anicteric  ENMT: External Ears, Nose  appear normal. Grossly normal hearing. Mucous membranes are moist.  Neck: Appears normal, supple, no cervical masses, normal ROM, no appreciable thyromegaly, no JVD Respiratory: Clear to auscultation bilaterally, no wheezing, rales, rhonchi or crackles. Normal respiratory effort and patient is not tachypenic. No accessory muscle use.  Cardiovascular: RRR, no murmurs / rubs / gallops. S1 and S2 auscultated. Has 1-2+ Left Lower extremity edema.  Abdomen: Soft, non-tender, non-distended. No masses palpated. No appreciable hepatosplenomegaly. Bowel sounds positive x4.  GU: Deferred. Musculoskeletal: Left knee extended and wrapped. Has a Hemovac drain coming out of it.  Skin: No rashes, lesions, ulcers on a limited skin eval. No induration; Warm and dry.  Neurologic: CN 2-12 grossly intact with no focal deficits. Romberg sign and cerebellar reflexes not assessed.  Psychiatric: Normal judgment and insight.  Alert and oriented x 3. Normal mood and appropriate affect.   Data Reviewed: I have personally reviewed following labs and imaging studies  CBC: Recent Labs  Lab 01/11/17 1036 01/12/17 0432  WBC 11.2* 12.5*  NEUTROABS 8.6*  --   HGB 13.1 11.8*  HCT 39.8 37.4*  MCV 81.1 80.6  PLT 209 657   Basic Metabolic Panel: Recent Labs  Lab 01/11/17 1036 01/12/17 0432  NA 133* 134*  K 4.2 3.9  CL 100* 103  CO2 25 25  GLUCOSE 103* 125*  BUN 16 12  CREATININE 1.04 0.98  CALCIUM 8.6* 8.0*   GFR: Estimated Creatinine Clearance: 94.6 mL/min (by C-G formula based on SCr of 0.98 mg/dL). Liver Function Tests: No results for input(s): AST, ALT, ALKPHOS, BILITOT, PROT, ALBUMIN in the last 168 hours. No results for input(s): LIPASE, AMYLASE in the last 168 hours. No results for input(s): AMMONIA in the last 168 hours. Coagulation Profile: No results for input(s): INR, PROTIME in the last 168 hours. Cardiac Enzymes: No results for input(s): CKTOTAL, CKMB, CKMBINDEX, TROPONINI in the last 168 hours. BNP (last 3 results) No results for input(s): PROBNP in the last 8760 hours. HbA1C: No results for input(s): HGBA1C in the last 72 hours. CBG: No results for input(s): GLUCAP in the last 168 hours. Lipid Profile: No results for input(s): CHOL, HDL, LDLCALC, TRIG, CHOLHDL, LDLDIRECT in the last 72 hours. Thyroid Function Tests: No results for input(s): TSH, T4TOTAL, FREET4, T3FREE, THYROIDAB in the last 72 hours. Anemia Panel: No results for input(s): VITAMINB12, FOLATE, FERRITIN, TIBC, IRON, RETICCTPCT in the last 72 hours. Sepsis Labs: Recent Labs  Lab 01/11/17 1407  LATICACIDVEN 1.59    Recent Results (from the past 240 hour(s))  Culture, body fluid-bottle     Status: None (Preliminary result)   Collection Time: 01/11/17 11:26 AM  Result Value Ref Range Status   Specimen Description FLUID LEFT KNEE  Final   Special Requests NONE  Final   Gram Stain   Final    GRAM POSITIVE COCCI  IN CLUSTERS IN BOTH AEROBIC AND ANAEROBIC BOTTLES    Culture CULTURE REINCUBATED FOR BETTER GROWTH  Final   Report Status PENDING  Incomplete  Gram stain     Status: None   Collection Time: 01/11/17 11:26 AM  Result Value Ref Range Status   Specimen Description FLUID LEFT KNEE  Final   Special Requests NONE  Final   Gram Stain   Final    ABUNDANT WBC PRESENT,BOTH PMN AND MONONUCLEAR MODERATE GRAM POSITIVE COCCI IN PAIRS CRITICAL RESULT CALLED TO, READ BACK BY AND VERIFIED WITH: Gilberto Better RN 15:15 01/11/17 ([email or web address])    Report Status 01/11/2017 FINAL  Final  Aerobic/Anaerobic Culture (surgical/deep wound)     Status: None (Preliminary result)   Collection Time: 01/11/17  6:39 PM  Result Value Ref Range Status   Specimen Description FLUID SYNOVIAL LEFT KNEE  Final   Special Requests NONE  Final   Gram Stain   Final    ABUNDANT WBC PRESENT, PREDOMINANTLY PMN FEW GRAM POSITIVE COCCI IN CLUSTERS    Culture CULTURE REINCUBATED FOR BETTER GROWTH  Final   Report Status PENDING  Incomplete    Radiology Studies: Dg Chest Port 1 View  Result Date: 01/11/2017 CLINICAL DATA:  76 year old male with recent knee injection. Increased pain and swelling. EXAM: PORTABLE CHEST 1 VIEW COMPARISON:  Report of chest radiographs 11/12/1999 (no images available). FINDINGS: Portable AP semi upright view at 1545 hours. Low lung volumes. Mild cardiomegaly. Tortuous thoracic aorta. Calcified aortic atherosclerosis. Other mediastinal contours are within normal limits. Visualized tracheal air column is within normal limits. No pneumothorax, pulmonary edema, pleural effusion or confluent pulmonary opacity. IMPRESSION: Low lung volumes.  No acute cardiopulmonary abnormality identified. Electronically Signed   By: Genevie Ann M.D.   On: 01/11/2017 16:06   Dg Knee Complete 4 Views Left  Result Date: 01/11/2017 CLINICAL DATA:  Pain and swelling beginning yesterday, had injection knee in knee on Monday, today unable to  move leg, history diabetes mellitus, hypertension, heart failure EXAM: LEFT KNEE - COMPLETE 4+ VIEW COMPARISON:  None FINDINGS: Osseous demineralization. Tricompartmental osteoarthritic changes with joint space narrowing and spur formation greatest at patellofemoral joint. No acute fracture, dislocation, or bone destruction. No significant knee joint effusion. Scattered atherosclerotic calcifications. IMPRESSION: Advanced tricompartmental osteoarthritic changes of the LEFT knee. Electronically Signed   By: Lavonia Dana M.D.   On: 01/11/2017 10:08   Scheduled Meds: . amLODipine  10 mg Oral Daily  . aspirin  325 mg Oral Daily  . atorvastatin  10 mg Oral Q2000  . carvedilol  25 mg Oral BID WC  . furosemide  40 mg Oral Daily  . ramipril  10 mg Oral BID   Continuous Infusions: . cefTRIAXone (ROCEPHIN)  IV      LOS: 1 day   Kerney Elbe, DO [facility] Pager [phone]  If 7PM-7AM, please contact night-coverage [email or web address] Password [email or web address] 01/12/2017, 1:34 PM

## 2017-01-13 LAB — CBC WITH DIFFERENTIAL/PLATELET
Basophils Absolute: 0 10*3/uL (ref 0.0–0.1)
Basophils Relative: 0 %
EOS ABS: 0.2 10*3/uL (ref 0.0–0.7)
EOS PCT: 2 %
HCT: 34.7 % — ABNORMAL LOW (ref 39.0–52.0)
Hemoglobin: 11.2 g/dL — ABNORMAL LOW (ref 13.0–17.0)
LYMPHS ABS: 1.4 10*3/uL (ref 0.7–4.0)
LYMPHS PCT: 12 %
MCH: 26.2 pg (ref 26.0–34.0)
MCHC: 32.3 g/dL (ref 30.0–36.0)
MCV: 81.1 fL (ref 78.0–100.0)
MONO ABS: 1.3 10*3/uL — AB (ref 0.1–1.0)
MONOS PCT: 11 %
Neutro Abs: 8.8 10*3/uL — ABNORMAL HIGH (ref 1.7–7.7)
Neutrophils Relative %: 75 %
PLATELETS: 203 10*3/uL (ref 150–400)
RBC: 4.28 MIL/uL (ref 4.22–5.81)
RDW: 16.2 % — AB (ref 11.5–15.5)
WBC: 11.6 10*3/uL — AB (ref 4.0–10.5)

## 2017-01-13 LAB — COMPREHENSIVE METABOLIC PANEL
ALT: 17 U/L (ref 17–63)
ANION GAP: 8 (ref 5–15)
AST: 41 U/L (ref 15–41)
Albumin: 2.4 g/dL — ABNORMAL LOW (ref 3.5–5.0)
Alkaline Phosphatase: 72 U/L (ref 38–126)
BUN: 12 mg/dL (ref 6–20)
CALCIUM: 8.2 mg/dL — AB (ref 8.9–10.3)
CHLORIDE: 104 mmol/L (ref 101–111)
CO2: 24 mmol/L (ref 22–32)
CREATININE: 0.95 mg/dL (ref 0.61–1.24)
Glucose, Bld: 112 mg/dL — ABNORMAL HIGH (ref 65–99)
Potassium: 3.9 mmol/L (ref 3.5–5.1)
SODIUM: 136 mmol/L (ref 135–145)
Total Bilirubin: 0.6 mg/dL (ref 0.3–1.2)
Total Protein: 6.1 g/dL — ABNORMAL LOW (ref 6.5–8.1)

## 2017-01-13 LAB — PATHOLOGIST SMEAR REVIEW

## 2017-01-13 LAB — PHOSPHORUS: PHOSPHORUS: 3.3 mg/dL (ref 2.5–4.6)

## 2017-01-13 LAB — MAGNESIUM: MAGNESIUM: 1.9 mg/dL (ref 1.7–2.4)

## 2017-01-13 NOTE — Progress Notes (Signed)
PROGRESS NOTE    Matthew [name]  OFB:[id number] DOB: 1940/10/07 DOA: 01/11/2017 PCP: Matthew Hey, Matthew [name]  Brief Narrative:  Matthew [name] is a very pleasant 76 y.o. male with medical history significant  obesity, lower extremity edema, cardiomyopathy, COPD and other comorbids who presents to the emergency Department chief complaint of left knee pain and swelling. Initial evaluation is concerning for septic knee. ED provider discussed case with orthopedics who recommended a medical admission w with possible OR this afternoon.   Patient states he has chronic knee pain and received cortisone injection the most recent being 4 days ago. Since that time he had gradual swelling and decreased range of motion and increased pain in that left knee. This morning he was unable to walk/bear weight on that knee nor bend it at all. He denies any headache fever chills nausea vomiting diarrhea. He denies chest pain palpitation but does endorse some lower extremity edema since he was taken off of his spironolactone 7 weeks ago. He was given Lasix but reports this does not control the swelling as well. Today he indicates that the swelling in his left foot is "close to its usual". Was worked up for Septic Left knee and taken to the OR for I and D. Orthopedics following and consulted Infectious Diseases for further Abx Recommendations. PT Evaluated and recommending patient go to SNF.  01/13/17 - Patient seen. Nil complaints. Will arrange for Picc Line placement (Antibiotics will be needed for 4 weeks as per ID)  Assessment & Plan:   Principal Problem:   Septic joint of left knee joint ([place]) Active Problems:   Cardiomyopathy ([place])   Obesity   Coronary atherosclerosis of native coronary artery   Mixed hyperlipidemia   Essential hypertension, benign   Arthritis, septic, knee (HCC)   Hyponatremia   Septic arthritis of knee, left (HCC)   Normocytic anemia  Patellofemoral Arthritis/ Left Septic Knee s/p  Arthroscopic I and D, Four Compartment Synovectomy, Lateral Menisectomy, and Chondroplast of the Medial Patella Compartment POD 1 -Recent Left Knee Injection approximately 1 week ago -X-ray reveals advanced tricompartmental osteoarthritic changes of the left knee.  -He has a leukocytosis he is afebrile nontoxic appearing with a normal lactic acid. WBC went from 11.2 -> 12.5 -CRP was 19.4 and ESR was 60 -Synovial Fluid showed Turbid Appearance with 71,500 WBC, 94 Neutrophils; Gram Stain showed ABUNDANT WBC PRESENT,BOTH PMN AND MONONUCLEAR and MODERATE GRAM POSITIVE COCCI IN PAIRS; Cx re-incubated for [name] Growth  -Placed on IV Ceftriaxone; Will continue for now; IV Vancomycin was given in OR for Pre-Op -Infectious Diseases Consulted and will defer further Abx Treatment to them  -Orthopedic Surgery Consulted and took the patient to the OR 11/29 -Admitted to Matthew [name] obtained and showed Low lung volumes.  No acute cardiopulmonary abnormality identified. -Given IV Bolus of 500 mL yesterday and IVF now stopped -Continue to Monitor and Appreciate Orthopedic And Infectious Disease Recommendations -PT Evaluate and Treat -> Recommending SNF -C/w Pain Control with Hydrocodone-Acetaminophen 1-2 tab po q6hprn and with Tramadol 50 mg po q6hprn  -C/w Bowel Regimen with Senna-Docusate 1 tab po qHSprn  Cardiomyopathy/HFpEF/Chronic Diastolic CHF -Appears Compensated.  -Home medications include Lasix 40 mg po Daily, Ramapril 10 mg po BID and Coreg 25 mg po BID.   -Chart review indicates echo in 2014 within EF 55%. He saw his cardiologist in July of this year at which time he appeared euovolemic. Since then he has been taken off spironolactone due to potassium levels. He's developed  lower extremity edema and recently was advised by cardiology to start Lasix 40 mg daily. He denies weight gain shortness of breath.  -Strict I's/O's and Daily Weights; Patient is -30 mL since Admit -Daily weight not done  -C/w  Home Meds as above  CAD -No chest pain.  -Chart review indicates branch vessel disease noted by cath in 2001. No angina. Early follow-up with cardiology in July of this year -Continue Home Medications of ASA 325 mg po Daily, Carvedilol 25 mg po BID, Ramipril 10 mg po BID, and Atorvastatin 10 mg po Daily  -Follow EKG results  Hypertension -Blood pressures on the low end of normal.  -Meds include Lasix 40 mg po Daily, Amlodipine of 10 mg po Daily, Ramapril 10 mg po BID and Coreg 25 mg po BID -Held medications initially as his blood pressures on the low end of normal but now resumed -Continue to Monitor   Obesity  -BMI of 35.07 -Weight Loss Counseling given  -Nutritional Consult appreciated   Normocytic Anemia -Patient's Hb/Hct went from 13.1/39.8 -> 11.8/37.4 -Continue to Monitor for S/Sx of Bleeding -Repeat CBC in AM   Hyponatremia -Mild at 134 -Continue to Monitor   HLD -C/w Atorvastatin 10 mg po qHS  DVT prophylaxis: SCD's as just had knee I&D. Defer to Ortho to restart Pharmacological DVT Prophylaxis  Code Status: FULL CODE Family Communication: Discussed with daughter at bedside Disposition Plan: Remain Inpatient for further workup; SNF when medically stable for Discharge  Consultants:   Orthopedic Surgery Matthew [name]  Infectious Diseases   Procedures:  ARTHROSCOPIC IRRIGATION AND DEBRIDMENT KNEE (Left) Four compartment SYNOVECTOMY (Left) Lateral MENISECTOMY (Left) CHONDROPLASTY of medial patella compartment (Left)   Antimicrobials:  Anti-infectives (From admission, onward)   Start     Dose/Rate Route Frequency Ordered Stop   01/13/17 0500  vancomycin (VANCOCIN) IVPB 1000 mg/200 mL premix     1,000 mg 200 mL/hr over 60 Minutes Intravenous Every 12 hours 01/12/17 1518     01/12/17 1515  vancomycin (VANCOCIN) 2,000 mg in sodium chloride 0.9 % 500 mL IVPB     2,000 mg 250 mL/hr over 120 Minutes Intravenous  Once 01/12/17 1518 01/12/17 1825   01/12/17 1400   cefTRIAXone (ROCEPHIN) 2 g in dextrose 5 % 50 mL IVPB  Status:  Discontinued     2 g 100 mL/hr over 30 Minutes Intravenous Every 24 hours 01/11/17 1912 01/12/17 1428   01/12/17 0600  vancomycin (VANCOCIN) IVPB 1000 mg/200 mL premix     1,000 mg 200 mL/hr over 60 Minutes Intravenous On call to O.R. 01/11/17 1746 01/11/17 1932   01/11/17 1748  vancomycin (VANCOCIN) 1-5 GM/200ML-% IVPB    Comments:  Schonewitz, Leigh   : cabinet override      01/11/17 1748 01/11/17 1832   01/11/17 1330  cefTRIAXone (ROCEPHIN) 2 g in dextrose 5 % 50 mL IVPB     2 g 100 mL/hr over 30 Minutes Intravenous  Once 01/11/17 1326 01/11/17 1445     Subjective: Seen and examined this AM sitting in chair with knee propped up in no acute distress. No CP or SOB. States his pain is ok. No other concerns or complaints at this time.   Objective: Vitals:   01/12/17 1532 01/12/17 1603 01/12/17 2032 01/13/17 0431  BP: (!) 94/36 (!) 115/45 (!) 92/50 110/63  Pulse: 79  66 71  Resp: _0 Temp: 98.6 F (37 C)  98.7 F (37.1 C) 98.3 F (36.8 C)  TempSrc:  Oral  Oral Oral  SpO2: 98%  98% 98%  Weight:      Height:        Intake/Output Summary (Last 24 hours) at 01/13/2017 1341 Last data filed at 01/13/2017 1000 Gross per 24 hour  Intake 1340 ml  Output 1010 ml  Net 330 ml   Filed Weights   01/11/17 1705  Weight: 130.6 kg (288 lb)   Examination: Physical Exam:  Constitutional: WN/WD obese AAM in NAD and appears calm and comfortable Eyes: Lids and conjunctivae normal, sclerae anicteric  ENMT: External Ears, Nose appear normal. Grossly normal hearing. Mucous membranes are moist.  Neck: Appears normal, supple, no cervical masses, normal ROM, no appreciable thyromegaly, no JVD Respiratory: Clear to auscultation bilaterally, no wheezing, rales, rhonchi or crackles. Normal respiratory effort and patient is not tachypenic. No accessory muscle use.  Cardiovascular: RRR, no murmurs / rubs / gallops. S1 and S2  auscultated. Has 1-2+ Left Lower extremity edema.  Abdomen: Soft, non-tender, non-distended. No masses palpated. No appreciable hepatosplenomegaly. Bowel sounds positive x4.  GU: Deferred. Musculoskeletal: Left knee extended and wrapped. Has a Hemovac drain coming out of it.  Skin: No rashes, lesions, ulcers on a limited skin eval. No induration; Warm and dry.  Neurologic: CN 2-12 grossly intact with no focal deficits. Romberg sign and cerebellar reflexes not assessed.  Psychiatric: Normal judgment and insight. Alert and oriented x 3. Normal mood and appropriate affect.   Data Reviewed: I have personally reviewed following labs and imaging studies  CBC: Recent Labs  Lab 01/11/17 1036 01/12/17 0432 01/13/17 0511  WBC 11.2* 12.5* 11.6*  NEUTROABS 8.6*  --  8.8*  HGB 13.1 11.8* 11.2*  HCT 39.8 37.4* 34.7*  MCV 81.1 80.6 81.1  PLT 209 192 673   Basic Metabolic Panel: Recent Labs  Lab 01/11/17 1036 01/12/17 0432 01/12/17 1431 01/13/17 0511  NA 133* 134*  --  136  K 4.2 3.9  --  3.9  CL 100* 103  --  104  CO2 25 25  --  24  GLUCOSE 103* 125*  --  112*  BUN 16 12  --  12  CREATININE 1.04 0.98  --  0.95  CALCIUM 8.6* 8.0*  --  8.2*  MG  --   --  2.1 1.9  PHOS  --   --  3.2 3.3   GFR: Estimated Creatinine Clearance: 97.6 mL/min (by C-G formula based on SCr of 0.95 mg/dL). Liver Function Tests: Recent Labs  Lab 01/13/17 0511  AST 41  ALT 17  ALKPHOS 72  BILITOT 0.6  PROT 6.1*  ALBUMIN 2.4*   No results for input(s): LIPASE, AMYLASE in the last 168 hours. No results for input(s): AMMONIA in the last 168 hours. Coagulation Profile: No results for input(s): INR, PROTIME in the last 168 hours. Cardiac Enzymes: No results for input(s): CKTOTAL, CKMB, CKMBINDEX, TROPONINI in the last 168 hours. BNP (last 3 results) No results for input(s): PROBNP in the last 8760 hours. HbA1C: No results for input(s): HGBA1C in the last 72 hours. CBG: No results for input(s): GLUCAP  in the last 168 hours. Lipid Profile: No results for input(s): CHOL, HDL, LDLCALC, TRIG, CHOLHDL, LDLDIRECT in the last 72 hours. Thyroid Function Tests: No results for input(s): TSH, T4TOTAL, FREET4, T3FREE, THYROIDAB in the last 72 hours. Anemia Panel: No results for input(s): VITAMINB12, FOLATE, FERRITIN, TIBC, IRON, RETICCTPCT in the last 72 hours. Sepsis Labs: Recent Labs  Lab 01/11/17 1407  LATICACIDVEN 1.59  Recent Results (from the past 240 hour(s))  Culture, body fluid-bottle     Status: Abnormal (Preliminary result)   Collection Time: 01/11/17 11:26 AM  Result Value Ref Range Status   Specimen Description FLUID LEFT KNEE  Final   Special Requests NONE  Final   Gram Stain   Final    GRAM POSITIVE COCCI IN CLUSTERS IN BOTH AEROBIC AND ANAEROBIC BOTTLES    Culture (A)  Final    STAPHYLOCOCCUS AUREUS SUSCEPTIBILITIES TO FOLLOW    Report Status PENDING  Incomplete  Gram stain     Status: None   Collection Time: 01/11/17 11:26 AM  Result Value Ref Range Status   Specimen Description FLUID LEFT KNEE  Final   Special Requests NONE  Final   Gram Stain   Final    ABUNDANT WBC PRESENT,BOTH PMN AND MONONUCLEAR MODERATE GRAM POSITIVE COCCI IN PAIRS CRITICAL RESULT CALLED TO, READ BACK BY AND VERIFIED WITH: Matthew Better RN 15:15 01/11/17 ([email or web address])    Report Status 01/11/2017 FINAL  Final  Aerobic/Anaerobic Culture (surgical/deep wound)     Status: None (Preliminary result)   Collection Time: 01/11/17  6:39 PM  Result Value Ref Range Status   Specimen Description FLUID SYNOVIAL LEFT KNEE  Final   Special Requests NONE  Final   Gram Stain   Final    ABUNDANT WBC PRESENT, PREDOMINANTLY PMN FEW GRAM POSITIVE COCCI IN CLUSTERS    Culture CULTURE REINCUBATED FOR [name] GROWTH  Final   Report Status PENDING  Incomplete    Radiology Studies: Dg Chest Port 1 View  Result Date: 01/11/2017 CLINICAL DATA:  76 year old male with recent knee injection. Increased pain and  swelling. EXAM: PORTABLE CHEST 1 VIEW COMPARISON:  Report of chest radiographs 11/12/1999 (no images available). FINDINGS: Portable AP semi upright view at 1545 hours. Low lung volumes. Mild cardiomegaly. Tortuous thoracic aorta. Calcified aortic atherosclerosis. Other mediastinal contours are within normal limits. Visualized tracheal air column is within normal limits. No pneumothorax, pulmonary edema, pleural effusion or confluent pulmonary opacity. IMPRESSION: Low lung volumes.  No acute cardiopulmonary abnormality identified. Electronically Signed   By: Genevie Ann M.D.   On: 01/11/2017 16:06   Scheduled Meds: . amLODipine  10 mg Oral Daily  . aspirin  325 mg Oral Daily  . atorvastatin  10 mg Oral Q2000  . carvedilol  25 mg Oral BID WC  . furosemide  40 mg Oral Daily  . ramipril  10 mg Oral BID   Continuous Infusions: . vancomycin Stopped (01/13/17 0602)    LOS: 2 days   [place] [facility], Matthew [name] [facility] Pager [phone]  If 7PM-7AM, please contact night-coverage [email or web address] Password [email or web address] 01/13/2017, 1:41 PM

## 2017-01-13 NOTE — Progress Notes (Signed)
   PATIENT ID: Matthew [name]   2 Days Post-Op Procedure(s) (LRB): ARTHROSCOPIC IRRIGATION AND DEBRIDMENT KNEE (Left) Four compartment SYNOVECTOMY (Left) Lateral MENISECTOMY (Left) CHONDROPLASTY of medial and patella compartment (Left)  Subjective: Patient reports min pain in left knee, feeling much improved overall. No other complaints or concerns.   Objective:  Vitals:   01/12/17 2032 01/13/17 0431  BP: (!) 92/50 110/63  Pulse: 66 71  Resp: 16 16  Temp: 98.7 F (37.1 C) 98.3 F (36.8 C)  SpO2: 98% 98%    L knee dressing c/d/i Wiggles toes distally NVI Calves soft, nontender Drains in place w/ min serosanguinous drainage   Labs:  Recent Labs    01/11/17 1036 01/12/17 0432 01/13/17 0511  HGB 13.1 11.8* 11.2*   Recent Labs    01/12/17 0432 01/13/17 0511  WBC 12.5* 11.6*  RBC 4.64 4.28  HCT 37.4* 34.7*  PLT 192 203   Recent Labs    01/11/17 1036 01/12/17 0432  NA 133* 134*  K 4.2 3.9  CL 100* 103  CO2 25 25  BUN 16 12  CREATININE 1.04 0.98  GLUCOSE 103* 125*  CALCIUM 8.6* 8.0*    Assessment and Plan: 2 Day Post-Op Procedure(s) (LRB): ARTHROSCOPIC IRRIGATION AND DEBRIDMENT KNEE (Left) Four compartment SYNOVECTOMY (Left) Lateral MENISECTOMY (Left) CHONDROPLASTY of medial and patella compartment (Left) Septic arthritis left knee. Cultures still pending,but preliminary cultures have shown staph aureus. Plan: We appreciate the hospitalist's and infectious disease team's help. Continue Abx per ID He may weight-bear as tolerated on his left lower extremity.  Aspirin 325 mg daily with SCDs for DVT prophylaxis. Encourage him to be out of bed to the chair. Drains to be pulled prior to d/c Per SW note, likely d/c to SNF Needs PICC line placed. We will await the infectious disease recommendations regarding antibiotics and length of therapy. Per ortho okay to d/c when cleared by PT and medically stable

## 2017-01-14 DIAGNOSIS — B9562 Methicillin resistant Staphylococcus aureus infection as the cause of diseases classified elsewhere: Secondary | ICD-10-CM

## 2017-01-14 LAB — CULTURE, BODY FLUID W GRAM STAIN -BOTTLE

## 2017-01-14 LAB — VANCOMYCIN, TROUGH: Vancomycin Tr: 16 ug/mL (ref 15–20)

## 2017-01-14 MED ORDER — VANCOMYCIN HCL IN DEXTROSE 1-5 GM/200ML-% IV SOLN
1000.0000 mg | Freq: Two times a day (BID) | INTRAVENOUS | Status: DC
  Administered 2017-01-14 – 2017-01-16 (×4): 1000 mg via INTRAVENOUS
  Filled 2017-01-14 (×5): qty 200

## 2017-01-14 MED ORDER — HYDROCODONE-ACETAMINOPHEN 5-325 MG PO TABS: 1.0000 | ORAL_TABLET | Freq: Four times a day (QID) | ORAL | 0 refills | Status: DC | PRN

## 2017-01-14 MED ORDER — CEFAZOLIN SODIUM-DEXTROSE 2-4 GM/100ML-% IV SOLN
2.0000 g | Freq: Three times a day (TID) | INTRAVENOUS | Status: DC
  Filled 2017-01-14: qty 100

## 2017-01-14 NOTE — Progress Notes (Signed)
Paged Dr Dartha Lodge x2, no return page at this time to notify of pts requests and refusal for PICC placement at this time.

## 2017-01-14 NOTE — Progress Notes (Signed)
Spoke with pt and dtr re PICC line placement for LT ABT for approximately one hour.  Reviewed risks and benefits. Pt very concerned about the impact on his heart and prefers to speak to his cardiologist before he is agreeable.  Also wants to speak to his doctor regarding need for IV ABT or po meds etc.  Pt and dtr pleasant and does not want to make a rush decision.  Stayed at bedside at length to educate them on needs and options.  IV Midline an option if Vancomycin less than 6 days or a different medication available for treatment.  Pt and dtr verbalize more preference to a midline over a PICC.

## 2017-01-14 NOTE — Progress Notes (Signed)
   PATIENT ID: Matthew [name]   3 Days Post-Op Procedure(s) (LRB): ARTHROSCOPIC IRRIGATION AND DEBRIDMENT KNEE (Left) Four compartment SYNOVECTOMY (Left) Lateral MENISECTOMY (Left) CHONDROPLASTY of medial and patella compartment (Left)  Subjective: Patient reports min pain in left knee, feeling much improved overall. Was able to ambulate with PT yesterday. No other complaints or concerns.      Objective:  Vitals:   01/14/17 0455 01/14/17 0900  BP: (!) 107/36 134/62  Pulse: 77 76  Resp: 13 18  Temp: 99.4 F (37.4 C)   SpO2: 98% 100%     L knee dressing c/d/i Wiggles toes distally NVI Calves soft, nontender Drains removed and dressing changed uneventfully    Labs:  Recent Labs    01/11/17 1036 01/12/17 0432 01/13/17 0511  HGB 13.1 11.8* 11.2*   Recent Labs    01/12/17 0432 01/13/17 0511  WBC 12.5* 11.6*  RBC 4.64 4.28  HCT 37.4* 34.7*  PLT 192 203   Recent Labs    01/12/17 0432 01/13/17 0511  NA 134* 136  K 3.9 3.9  CL 103 104  CO2 25 24  BUN 12 12  CREATININE 0.98 0.95  GLUCOSE 125* 112*  CALCIUM 8.0* 8.2*    Assessment and Plan: 3 Day Post-OpProcedure(s) (LRB): ARTHROSCOPIC IRRIGATION AND DEBRIDMENT KNEE (Left) Four compartment SYNOVECTOMY (Left) Lateral MENISECTOMY (Left) CHONDROPLASTY of medial and patella compartment (Left) Septic arthritis left knee. Managed by ID Plan: We appreciate the hospitalist'sand infectious disease team's help. Continue Abx per ID, PICC line placement today He may weight-bear as tolerated on his left lower extremity.  Aspirin 325 mgdaily with SCDs for DVT prophylaxis. Encourage him to be out of bed to the chair. Drains pulled today Per SW note, likely d/c to SNF Per ortho okay to d/c when medically stable per medicine

## 2017-01-14 NOTE — Progress Notes (Addendum)
[facility]   Reason for visit: Follow up on septic arthritis  Interval History: growth now MSSA in synovial fluid; no new complaints, no signficant pain, WBC 11.6, afebrile.  No associated rash, diarrhea.  Creat wnl.   Physical Exam: Constitutional:  Vitals:   01/14/17 0455 01/14/17 0900  BP: (!) 107/36 134/62  Pulse: 77 76  Resp: 13 18  Temp: 99.4 F (37.4 C)   SpO2: 98% 100%   patient appears in NAD Eyes: anicteric HENT: no thrush Respiratory: Normal respiratory effort; CTA B Cardiovascular: RRR MS: knee wrapped  Review of Systems: Constitutional: negative for fevers, chills and malaise Gastrointestinal: negative for diarrhea Integument/breast: negative for rash  Lab Results  Component Value Date   WBC 11.6 (H) 01/13/2017   HGB 11.2 (L) 01/13/2017   HCT 34.7 (L) 01/13/2017   MCV 81.1 01/13/2017   PLT 203 01/13/2017    Lab Results  Component Value Date   CREATININE 0.95 01/13/2017   BUN 12 01/13/2017   NA 136 01/13/2017   K 3.9 01/13/2017   CL 104 01/13/2017   CO2 24 01/13/2017    Lab Results  Component Value Date   ALT 17 01/13/2017   AST 41 01/13/2017   ALKPHOS 72 01/13/2017     Microbiology: Recent Results (from the past 240 hour(s))  Culture, body fluid-bottle     Status: Abnormal   Collection Time: 01/11/17 11:26 AM  Result Value Ref Range Status   Specimen Description FLUID LEFT KNEE  Final   Special Requests NONE  Final   Gram Stain   Final    GRAM POSITIVE COCCI IN CLUSTERS IN BOTH AEROBIC AND ANAEROBIC BOTTLES    Culture METHICILLIN RESISTANT STAPHYLOCOCCUS AUREUS (A)  Final   Report Status 01/14/2017 FINAL  Final   Organism ID, Bacteria METHICILLIN RESISTANT STAPHYLOCOCCUS AUREUS  Final      Susceptibility   Methicillin resistant staphylococcus aureus - MIC*    CIPROFLOXACIN >=8 RESISTANT Resistant     ERYTHROMYCIN >=8 RESISTANT Resistant     GENTAMICIN <=0.5 SENSITIVE Sensitive     OXACILLIN RESISTANT  Resistant     TETRACYCLINE <=1 SENSITIVE Sensitive     VANCOMYCIN <=0.5 SENSITIVE Sensitive     TRIMETH/SULFA <=10 SENSITIVE Sensitive     CLINDAMYCIN >=8 RESISTANT Resistant     RIFAMPIN <=0.5 SENSITIVE Sensitive     Inducible Clindamycin NEGATIVE Sensitive     * METHICILLIN RESISTANT STAPHYLOCOCCUS AUREUS  Gram stain     Status: None   Collection Time: 01/11/17 11:26 AM  Result Value Ref Range Status   Specimen Description FLUID LEFT KNEE  Final   Special Requests NONE  Final   Gram Stain   Final    ABUNDANT WBC PRESENT,BOTH PMN AND MONONUCLEAR MODERATE GRAM POSITIVE COCCI IN PAIRS CRITICAL RESULT CALLED TO, READ BACK BY AND VERIFIED WITH: Dian QueenK. Maynard RN 15:15 01/11/17 ([email or web address])    Report Status 01/11/2017 FINAL  Final  Aerobic/Anaerobic Culture (surgical/deep wound)     Status: None (Preliminary result)   Collection Time: 01/11/17  6:39 PM  Result Value Ref Range Status   Specimen Description FLUID SYNOVIAL LEFT KNEE  Final   Special Requests NONE  Final   Gram Stain   Final    ABUNDANT WBC PRESENT, PREDOMINANTLY PMN FEW GRAM POSITIVE COCCI IN CLUSTERS    Culture   Final    FEW STAPHYLOCOCCUS AUREUS NO ANAEROBES ISOLATED; CULTURE IN PROGRESS FOR 5 DAYS    Report Status  PENDING  Incomplete   Organism ID, Bacteria STAPHYLOCOCCUS AUREUS  Final      Susceptibility   Staphylococcus aureus - MIC*    CIPROFLOXACIN >=8 RESISTANT Resistant     ERYTHROMYCIN >=8 RESISTANT Resistant     GENTAMICIN <=0.5 SENSITIVE Sensitive     OXACILLIN 0.5 SENSITIVE Sensitive     TETRACYCLINE <=1 SENSITIVE Sensitive     VANCOMYCIN <=0.5 SENSITIVE Sensitive     TRIMETH/SULFA <=10 SENSITIVE Sensitive     CLINDAMYCIN >=8 RESISTANT Resistant     RIFAMPIN <=0.5 SENSITIVE Sensitive     Inducible Clindamycin NEGATIVE Sensitive     * FEW STAPHYLOCOCCUS AUREUS    Impression/Plan:  1. Staph aureus septic knee - will need 4 weeks of IV cefazolin.  I will change antibiotics.  Stop date of 12/26.   I  will place OPAT order set. Patient going to rehab but may need Home Health after that, depending on duration of rehab.    ADDENDUM 01/14/17: synovial fluid MSSA but aspiration done in the ED is MRSA now.  Though I most suspect that the synovial fluid is most accurate, I will keep him on vancomycin for the duration instead of cefazolin.    Please keep in picc line until seen by MD.    2.  Access - picc line ordered.    3.  Medication monitoring - creat has remained wnl.  Will go back to vancomycin and continue to monitor the creat.    I will arrange follow up in our clinic.  I will sign off at this time. thanks

## 2017-01-14 NOTE — Progress Notes (Signed)
PROGRESS NOTE    Matthew [name]  HWK:[id number] DOB: 1940/06/07 DOA: 01/11/2017 PCP: [name], MD  Brief Narrative:  Matthew [name] is a very pleasant 76 y.o. male with medical history significant  obesity, lower extremity edema, cardiomyopathy, COPD and other comorbids who presents to the emergency Department chief complaint of left knee pain and swelling. Initial evaluation is concerning for septic knee. ED provider discussed case with orthopedics who recommended a medical admission w with possible OR this afternoon.   Patient states he has chronic knee pain and received cortisone injection the most recent being 4 days ago. Since that time he had gradual swelling and decreased range of motion and increased pain in that left knee. This morning he was unable to walk/bear weight on that knee nor bend it at all. He denies any headache fever chills nausea vomiting diarrhea. He denies chest pain palpitation but does endorse some lower extremity edema since he was taken off of his spironolactone 7 weeks ago. He was given Lasix but reports this does not control the swelling as well. Today he indicates that the swelling in his left foot is "close to its usual". Was worked up for Septic Left knee and taken to the OR for I and D. Orthopedics following and consulted Infectious Diseases for further Abx Recommendations. PT Evaluated and recommending patient go to SNF.  01/14/17 - Patient seen. Nil complaints. Joint fluid analysis grew MRSA. For Picc Line placement (Antibiotics will be needed for 4 weeks as per ID). Contact precaution.  Assessment & Plan:   Principal Problem:   Septic joint of left knee joint (HCC) Active Problems:   Cardiomyopathy ([place])   Obesity   Coronary atherosclerosis of native coronary artery   Mixed hyperlipidemia   Essential hypertension, benign   Arthritis, septic, knee (HCC)   Hyponatremia   Septic arthritis of knee, left (HCC)   Normocytic  anemia  Patellofemoral Arthritis/ Left Septic Knee s/p Arthroscopic I and D, Four Compartment Synovectomy, Lateral Menisectomy, and Chondroplast of the Medial Patella Compartment POD 1 -Recent Left Knee Injection approximately 1 week ago -X-ray reveals advanced tricompartmental osteoarthritic changes of the left knee.  -He has a leukocytosis he is afebrile nontoxic appearing with a normal lactic acid. WBC went from 11.2 -> 12.5 -CRP was 19.4 and ESR was 60 -Synovial Fluid showed Turbid Appearance with 71,500 WBC, 94 Neutrophils; Gram Stain showed ABUNDANT WBC PRESENT,BOTH PMN AND MONONUCLEAR and MODERATE GRAM POSITIVE COCCI IN PAIRS; Cx re-incubated for better Growth  -Placed on IV Ceftriaxone; Will continue for now; IV Vancomycin was given in OR for Pre-Op -Infectious Diseases Consulted and will defer further Abx Treatment to them  -Orthopedic Surgery Consulted and took the patient to the OR 11/29 -Admitted to [facility] obtained and showed Low lung volumes.  No acute cardiopulmonary abnormality identified. -Given IV Bolus of 500 mL yesterday and IVF now stopped -Continue to Monitor and Appreciate Orthopedic And Infectious Disease Recommendations -PT Evaluate and Treat -> Recommending SNF -C/w Pain Control with Hydrocodone-Acetaminophen 1-2 tab po q6hprn and with Tramadol 50 mg po q6hprn  -C/w Bowel Regimen with Senna-Docusate 1 tab po qHSprn  Cardiomyopathy/HFpEF/Chronic Diastolic CHF -Appears Compensated.  -Home medications include Lasix 40 mg po Daily, Ramapril 10 mg po BID and Coreg 25 mg po BID.   -Chart review indicates echo in 2014 within EF 55%. He saw his cardiologist in July of this year at which time he appeared euovolemic. Since then he has been taken off spironolactone due  to potassium levels. He's developed lower extremity edema and recently was advised by cardiology to start Lasix 40 mg daily. He denies weight gain shortness of breath.  -Strict I's/O's and Daily Weights;  Patient is -30 mL since Admit -Daily weight not done  -C/w Home Meds as above  CAD -No chest pain.  -Chart review indicates branch vessel disease noted by cath in 2001. No angina. Early follow-up with cardiology in July of this year -Continue Home Medications of ASA 325 mg po Daily, Carvedilol 25 mg po BID, Ramipril 10 mg po BID, and Atorvastatin 10 mg po Daily  -Follow EKG results  Hypertension -Blood pressures on the low end of normal.  -Meds include Lasix 40 mg po Daily, Amlodipine of 10 mg po Daily, Ramapril 10 mg po BID and Coreg 25 mg po BID -Held medications initially as his blood pressures on the low end of normal but now resumed -Continue to Monitor   Obesity  -BMI of 35.07 -Weight Loss Counseling given  -Nutritional Consult appreciated   Normocytic Anemia -Patient's Hb/Hct went from 13.1/39.8 -> 11.8/37.4 -Continue to Monitor for S/Sx of Bleeding -Repeat CBC in AM   Hyponatremia -Mild at 134 -Continue to Monitor   HLD -C/w Atorvastatin 10 mg po qHS  DVT prophylaxis: SCD's as just had knee I&D. Defer to Ortho to restart Pharmacological DVT Prophylaxis  Code Status: FULL CODE Family Communication: Discussed with daughter at bedside Disposition Plan: Remain Inpatient for further workup; SNF when medically stable for Discharge  Consultants:   Orthopedic Surgery Dr. [name]  Infectious Diseases   Procedures:  ARTHROSCOPIC IRRIGATION AND DEBRIDMENT KNEE (Left) Four compartment SYNOVECTOMY (Left) Lateral MENISECTOMY (Left) CHONDROPLASTY of medial patella compartment (Left)   Antimicrobials:  Anti-infectives (From admission, onward)   Start     Dose/Rate Route Frequency Ordered Stop   01/13/17 0500  vancomycin (VANCOCIN) IVPB 1000 mg/200 mL premix     1,000 mg 200 mL/hr over 60 Minutes Intravenous Every 12 hours 01/12/17 1518     01/12/17 1515  vancomycin (VANCOCIN) 2,000 mg in sodium chloride 0.9 % 500 mL IVPB     2,000 mg 250 mL/hr over 120 Minutes  Intravenous  Once 01/12/17 1518 01/12/17 1825   01/12/17 1400  cefTRIAXone (ROCEPHIN) 2 g in dextrose 5 % 50 mL IVPB  Status:  Discontinued     2 g 100 mL/hr over 30 Minutes Intravenous Every 24 hours 01/11/17 1912 01/12/17 1428   01/12/17 0600  vancomycin (VANCOCIN) IVPB 1000 mg/200 mL premix     1,000 mg 200 mL/hr over 60 Minutes Intravenous On call to O.R. 01/11/17 1746 01/11/17 1932   01/11/17 1748  vancomycin (VANCOCIN) 1-5 GM/200ML-% IVPB    Comments:  Schonewitz, Leigh   : cabinet override      01/11/17 1748 01/11/17 1832   01/11/17 1330  cefTRIAXone (ROCEPHIN) 2 g in dextrose 5 % 50 mL IVPB     2 g 100 mL/hr over 30 Minutes Intravenous  Once 01/11/17 1326 01/11/17 1445     Subjective: Seen and examined this AM sitting in chair with knee propped up in no acute distress. No CP or SOB. States his pain is ok. No other concerns or complaints at this time.   Objective: Vitals:   01/13/17 1300 01/13/17 2149 01/14/17 0455 01/14/17 0900  BP: (!) 107/51 (!) 121/42 (!) 107/36 134/62  Pulse: 70 83 77 76  Resp: _0 Temp: 99.5 F (37.5 C) 99.2 F (37.3 C) 99.4 F (  37.4 C)   TempSrc: Oral Oral Oral   SpO2: 97% 100% 98% 100%  Weight:      Height:        Intake/Output Summary (Last 24 hours) at 01/14/2017 1235 Last data filed at 01/14/2017 1028 Gross per 24 hour  Intake 680 ml  Output 515 ml  Net 165 ml   Filed Weights   01/11/17 1705  Weight: 130.6 kg (288 lb)   Examination: Physical Exam:  Constitutional: WN/WD obese AAM in NAD and appears calm and comfortable Eyes: Lids and conjunctivae normal, sclerae anicteric  ENMT: External Ears, Nose appear normal. Grossly normal hearing. Mucous membranes are moist.  Neck: Appears normal, supple, no cervical masses, normal ROM, no appreciable thyromegaly, no JVD Respiratory: Clear to auscultation bilaterally, no wheezing, rales, rhonchi or crackles. Normal respiratory effort and patient is not tachypenic. No accessory muscle  use.  Cardiovascular: RRR, no murmurs / rubs / gallops. S1 and S2 auscultated. Has 1-2+ Left Lower extremity edema.  Abdomen: Soft, non-tender, non-distended. No masses palpated. No appreciable hepatosplenomegaly. Bowel sounds positive x4.  GU: Deferred. Musculoskeletal: Left knee extended and wrapped. Has a Hemovac drain coming out of it.  Skin: No rashes, lesions, ulcers on a limited skin eval. No induration; Warm and dry.  Neurologic: CN 2-12 grossly intact with no focal deficits. Romberg sign and cerebellar reflexes not assessed.  Psychiatric: Normal judgment and insight. Alert and oriented x 3. Normal mood and appropriate affect.   Data Reviewed: I have personally reviewed following labs and imaging studies  CBC: Recent Labs  Lab 01/11/17 1036 01/12/17 0432 01/13/17 0511  WBC 11.2* 12.5* 11.6*  NEUTROABS 8.6*  --  8.8*  HGB 13.1 11.8* 11.2*  HCT 39.8 37.4* 34.7*  MCV 81.1 80.6 81.1  PLT 209 192 341   Basic Metabolic Panel: Recent Labs  Lab 01/11/17 1036 01/12/17 0432 01/12/17 1431 01/13/17 0511  NA 133* 134*  --  136  K 4.2 3.9  --  3.9  CL 100* 103  --  104  CO2 25 25  --  24  GLUCOSE 103* 125*  --  112*  BUN 16 12  --  12  CREATININE 1.04 0.98  --  0.95  CALCIUM 8.6* 8.0*  --  8.2*  MG  --   --  2.1 1.9  PHOS  --   --  3.2 3.3   GFR: Estimated Creatinine Clearance: 97.6 mL/min (by C-G formula based on SCr of 0.95 mg/dL). Liver Function Tests: Recent Labs  Lab 01/13/17 0511  AST 41  ALT 17  ALKPHOS 72  BILITOT 0.6  PROT 6.1*  ALBUMIN 2.4*   No results for input(s): LIPASE, AMYLASE in the last 168 hours. No results for input(s): AMMONIA in the last 168 hours. Coagulation Profile: No results for input(s): INR, PROTIME in the last 168 hours. Cardiac Enzymes: No results for input(s): CKTOTAL, CKMB, CKMBINDEX, TROPONINI in the last 168 hours. BNP (last 3 results) No results for input(s): PROBNP in the last 8760 hours. HbA1C: No results for input(s):  HGBA1C in the last 72 hours. CBG: No results for input(s): GLUCAP in the last 168 hours. Lipid Profile: No results for input(s): CHOL, HDL, LDLCALC, TRIG, CHOLHDL, LDLDIRECT in the last 72 hours. Thyroid Function Tests: No results for input(s): TSH, T4TOTAL, FREET4, T3FREE, THYROIDAB in the last 72 hours. Anemia Panel: No results for input(s): VITAMINB12, FOLATE, FERRITIN, TIBC, IRON, RETICCTPCT in the last 72 hours. Sepsis Labs: Recent Labs  Lab 01/11/17 1407  LATICACIDVEN 1.59    Recent Results (from the past 240 hour(s))  Culture, body fluid-bottle     Status: Abnormal   Collection Time: 01/11/17 11:26 AM  Result Value Ref Range Status   Specimen Description FLUID LEFT KNEE  Final   Special Requests NONE  Final   Gram Stain   Final    GRAM POSITIVE COCCI IN CLUSTERS IN BOTH AEROBIC AND ANAEROBIC BOTTLES    Culture METHICILLIN RESISTANT STAPHYLOCOCCUS AUREUS (A)  Final   Report Status 01/14/2017 FINAL  Final   Organism ID, Bacteria METHICILLIN RESISTANT STAPHYLOCOCCUS AUREUS  Final      Susceptibility   Methicillin resistant staphylococcus aureus - MIC*    CIPROFLOXACIN >=8 RESISTANT Resistant     ERYTHROMYCIN >=8 RESISTANT Resistant     GENTAMICIN <=0.5 SENSITIVE Sensitive     OXACILLIN RESISTANT Resistant     TETRACYCLINE <=1 SENSITIVE Sensitive     VANCOMYCIN <=0.5 SENSITIVE Sensitive     TRIMETH/SULFA <=10 SENSITIVE Sensitive     CLINDAMYCIN >=8 RESISTANT Resistant     RIFAMPIN <=0.5 SENSITIVE Sensitive     Inducible Clindamycin NEGATIVE Sensitive     * METHICILLIN RESISTANT STAPHYLOCOCCUS AUREUS  Gram stain     Status: None   Collection Time: 01/11/17 11:26 AM  Result Value Ref Range Status   Specimen Description FLUID LEFT KNEE  Final   Special Requests NONE  Final   Gram Stain   Final    ABUNDANT WBC PRESENT,BOTH PMN AND MONONUCLEAR MODERATE GRAM POSITIVE COCCI IN PAIRS CRITICAL RESULT CALLED TO, READ BACK BY AND VERIFIED WITH: Gilberto Better RN 15:15 01/11/17  ([email or web address])    Report Status 01/11/2017 FINAL  Final  Aerobic/Anaerobic Culture (surgical/deep wound)     Status: None (Preliminary result)   Collection Time: 01/11/17  6:39 PM  Result Value Ref Range Status   Specimen Description FLUID SYNOVIAL LEFT KNEE  Final   Special Requests NONE  Final   Gram Stain   Final    ABUNDANT WBC PRESENT, PREDOMINANTLY PMN FEW GRAM POSITIVE COCCI IN CLUSTERS    Culture   Final    FEW STAPHYLOCOCCUS AUREUS NO ANAEROBES ISOLATED; CULTURE IN PROGRESS FOR 5 DAYS    Report Status PENDING  Incomplete   Organism ID, Bacteria STAPHYLOCOCCUS AUREUS  Final      Susceptibility   Staphylococcus aureus - MIC*    CIPROFLOXACIN >=8 RESISTANT Resistant     ERYTHROMYCIN >=8 RESISTANT Resistant     GENTAMICIN <=0.5 SENSITIVE Sensitive     OXACILLIN 0.5 SENSITIVE Sensitive     TETRACYCLINE <=1 SENSITIVE Sensitive     VANCOMYCIN <=0.5 SENSITIVE Sensitive     TRIMETH/SULFA <=10 SENSITIVE Sensitive     CLINDAMYCIN >=8 RESISTANT Resistant     RIFAMPIN <=0.5 SENSITIVE Sensitive     Inducible Clindamycin NEGATIVE Sensitive     * FEW STAPHYLOCOCCUS AUREUS    Radiology Studies: No results found. Scheduled Meds: . amLODipine  10 mg Oral Daily  . aspirin  325 mg Oral Daily  . atorvastatin  10 mg Oral Q2000  . carvedilol  25 mg Oral BID WC  . furosemide  40 mg Oral Daily  . ramipril  10 mg Oral BID   Continuous Infusions: . vancomycin Stopped (01/14/17 0651)    LOS: 3 days   Bonnell Public, MD [facility] Pager [phone]  If 7PM-7AM, please contact night-coverage [email or web address] Password [facility] 01/14/2017, 12:35 PM

## 2017-01-14 NOTE — Progress Notes (Signed)
PHARMACY CONSULT NOTE FOR:  OUTPATIENT  PARENTERAL ANTIBIOTIC THERAPY (OPAT)  Indication: septic arthritis Regimen: vancomycin 1G IV q12 End date: 02/07/17  IV antibiotic discharge orders are pended. To discharging provider:  please sign these orders via discharge navigator,  Select New Orders & click on the button choice - Manage This Unsigned Work.     Thank you for allowing pharmacy to be a part of this patient's care.  Toniann Fail Anjuli Gemmill 01/14/2017, 5:41 PM

## 2017-01-14 NOTE — Progress Notes (Signed)
Pharmacy Antibiotic Note  Matthew [name] is a 76 y.o. male admitted on 01/11/2017 with septic joint and started on Rocephin.  Now growing preliminary Staph aureus on left knee fluid culture.  Pharmacy has been consulted for vancomycin dosing.  Patient underwent meniscectomy and synovectomy on 01/12/17.  Renal function stable near baseline ~1.0 (CrCl ~100 mL/min), remains afebrile, WBC 11.6, Vancomycin trough 16  Plan: Vancomycin 1G IV q12 hours  Monitor renal fxn, micro data, vanc trough at Css  Height: 6\' 4"  (193 cm) Weight: 288 lb (130.6 kg) IBW/kg (Calculated) : 86.8  Temp (24hrs), Avg:99.3 F (37.4 C), Min:99.2 F (37.3 C), Max:99.4 F (37.4 C)  Recent Labs  Lab 01/11/17 1036 01/11/17 1407 01/12/17 0432 01/13/17 0511 01/14/17 1540  WBC 11.2*  --  12.5* 11.6*  --   CREATININE 1.04  --  0.98 0.95  --   LATICACIDVEN  --  1.59  --   --   --   VANCOTROUGH  --   --   --   --  16    Estimated Creatinine Clearance: 97.6 mL/min (by C-G formula based on SCr of 0.95 mg/dL).    No Known Allergies  Vanc 11/30 >> CTX 11/29 >> 11/30  11/29 left knee synovial fluid - MSSA 11/29 left knee fluid - MRSA   Ruben Im, PharmD Clinical Pharmacist 01/14/2017 5:23 PM

## 2017-01-14 NOTE — Discharge Instructions (Signed)
Discharge Instructions after Knee Arthroscopy ° ° °You will have a light dressing on your knee.  °Leave the dressing in place until the third day after your surgery and then remove it and place a band-aid over the stitches.  °After the bandage has been removed you may shower, but do not soak the incision. °You may begin gentle motion of your leg immediately after surgery. °Pump your foot up and down 20 times per hour, every hour you are awake.  °Apply ice to the knee 3 times per day for 30 minutes for the first 1 week until your knee is feeling comfortable again. Do not use heat.  °You may begin straight leg raising exercises (if you have a brace with it on). While lying down, pull your foot all the way up, tighten your quadriceps muscle and lift your heel off of the ground. Hold this position for 2 seconds, and then let the leg back down. Repeat the exercise 10 times, at least 3 times a day.  °Pain medicine has been prescribed for you.  °Use your medicine as needed over the first 48 hours, and then you can begin to taper your use. You may take Extra Strength Tylenol or Tylenol only in place of the pain pills.  °Take one 81mg aspirin daily for 3 weeks post-operatively unless it upsets your stomach. ° ° °Please call [phone] during normal business hours or [phone] after hours for any problems. Including the following: ° °- excessive redness of the incisions °- drainage for more than 4 days °- fever of more than 101.5 F ° °*Please note that pain medications will not be refilled after hours or on weekends. ° °  °

## 2017-01-15 ENCOUNTER — Telehealth: Payer: Self-pay | Admitting: Interventional Cardiology

## 2017-01-15 DIAGNOSIS — Z5181 Encounter for therapeutic drug level monitoring: Secondary | ICD-10-CM

## 2017-01-15 DIAGNOSIS — B9561 Methicillin susceptible Staphylococcus aureus infection as the cause of diseases classified elsewhere: Secondary | ICD-10-CM

## 2017-01-15 DIAGNOSIS — I10 Essential (primary) hypertension: Secondary | ICD-10-CM

## 2017-01-15 DIAGNOSIS — I2510 Atherosclerotic heart disease of native coronary artery without angina pectoris: Secondary | ICD-10-CM

## 2017-01-15 DIAGNOSIS — M00062 Staphylococcal arthritis, left knee: Principal | ICD-10-CM

## 2017-01-15 DIAGNOSIS — Z9889 Other specified postprocedural states: Secondary | ICD-10-CM

## 2017-01-15 MED ORDER — SODIUM CHLORIDE 0.9% FLUSH
10.0000 mL | INTRAVENOUS | Status: DC | PRN
  Administered 2017-01-16: 10 mL
  Filled 2017-01-15: qty 40

## 2017-01-15 NOTE — Progress Notes (Signed)
[facility]  Date of Admission:  01/11/2017     Total days of antibiotics 4  Vancomycin Day 4          ASSESSMENT/PLAN 76 year old male with previous medical history of CAD, hypertension, and osteoarthritis admitted on 11/29 with left knee septic arthritis with synovial fluid white blood cell count of 71,500.  Postop day 4 of synovectomy and partial meniscectomy.  Drains removed without complication.  Has remained afebrile with no leukocytosis.  Original synovial cultures showing MRSA with follow-up cultures showing MSSA.  Receiving vancomycin.  Septic arthritis of the left knee - has remained stable and improving. Slightly elevated temperature of 100.9 today will continue to monitor.  Tolerating vancomycin with no adverse side effects.  Patient is now okay with PICC line and will attempt to reinsert.  Continue vancomycin times 4 weeks. OPAT orders previously placed.   Health maintenance - Hepatitis C ordered.   Therapeutic drug monitoring - Creatinine stable with vancomycin. Continue to monitor during therapy.   Principal Problem:   Septic joint of left knee joint (HCC) Active Problems:   Cardiomyopathy (HCC)   Obesity   Coronary atherosclerosis of native coronary artery   Mixed hyperlipidemia   Essential hypertension, benign   Arthritis, septic, knee (HCC)   Hyponatremia   Septic arthritis of knee, left (HCC)   Normocytic anemia   . amLODipine  10 mg Oral Daily  . aspirin  325 mg Oral Daily  . atorvastatin  10 mg Oral Q2000  . carvedilol  25 mg Oral BID WC  . furosemide  40 mg Oral Daily  . ramipril  10 mg Oral BID    SUBJECTIVE: Temp of 100.9 this morning. Overall he is feeling good. Has been moving around. Hemovac drains have been removed. Had some concerns about PICC line for antibiotic therapy.   No Known Allergies   Review of Systems: Review of Systems  Constitutional: Negative for chills and fever.  Respiratory: Negative for  cough, shortness of breath and wheezing.   Cardiovascular: Negative for chest pain and leg swelling.  Skin: Negative for rash.      OBJECTIVE: Vitals:   01/14/17 1300 01/14/17 2225 01/14/17 2300 01/15/17 0652  BP: (!) 158/82 (!) 99/43  122/63  Pulse: 82 73  72  Resp: 18 16 16 18   Temp: 99.1 F (37.3 C) 99.6 F (37.6 C)  (!) 100.9 F (38.3 C)  TempSrc: Oral Oral    SpO2: 100% 98%  100%  Weight:      Height:       Body mass index is 35.06 kg/m.  Physical Exam  Constitutional: He is well-developed, well-nourished, and in no distress. No distress.  Pleasant; seated in the chair in no apparent distress.   Cardiovascular: Normal rate, regular rhythm, normal heart sounds and intact distal pulses. Exam reveals no gallop and no friction rub.  No murmur heard. Pulmonary/Chest: Effort normal and breath sounds normal. No respiratory distress. He has no wheezes. He has no rales. He exhibits no tenderness.  Musculoskeletal:  Left knee - Edema improved since previous exam with no tenderness. Distal pulses are intact and appropriate. Dressing remains in place and is clean, dry and intact.     Lab Results Lab Results  Component Value Date   WBC 11.6 (H) 01/13/2017   HGB 11.2 (L) 01/13/2017   HCT 34.7 (L) 01/13/2017   MCV 81.1 01/13/2017   PLT 203 01/13/2017    Lab Results  Component Value  Date   CREATININE 0.95 01/13/2017   BUN 12 01/13/2017   NA 136 01/13/2017   K 3.9 01/13/2017   CL 104 01/13/2017   CO2 24 01/13/2017    Lab Results  Component Value Date   ALT 17 01/13/2017   AST 41 01/13/2017   ALKPHOS 72 01/13/2017   BILITOT 0.6 01/13/2017     Microbiology: Recent Results (from the past 240 hour(s))  Culture, body fluid-bottle     Status: Abnormal   Collection Time: 01/11/17 11:26 AM  Result Value Ref Range Status   Specimen Description FLUID LEFT KNEE  Final   Special Requests NONE  Final   Gram Stain   Final    GRAM POSITIVE COCCI IN CLUSTERS IN BOTH AEROBIC  AND ANAEROBIC BOTTLES    Culture METHICILLIN RESISTANT STAPHYLOCOCCUS AUREUS (A)  Final   Report Status 01/14/2017 FINAL  Final   Organism ID, Bacteria METHICILLIN RESISTANT STAPHYLOCOCCUS AUREUS  Final      Susceptibility   Methicillin resistant staphylococcus aureus - MIC*    CIPROFLOXACIN >=8 RESISTANT Resistant     ERYTHROMYCIN >=8 RESISTANT Resistant     GENTAMICIN <=0.5 SENSITIVE Sensitive     OXACILLIN RESISTANT Resistant     TETRACYCLINE <=1 SENSITIVE Sensitive     VANCOMYCIN <=0.5 SENSITIVE Sensitive     TRIMETH/SULFA <=10 SENSITIVE Sensitive     CLINDAMYCIN >=8 RESISTANT Resistant     RIFAMPIN <=0.5 SENSITIVE Sensitive     Inducible Clindamycin NEGATIVE Sensitive     * METHICILLIN RESISTANT STAPHYLOCOCCUS AUREUS  Gram stain     Status: None   Collection Time: 01/11/17 11:26 AM  Result Value Ref Range Status   Specimen Description FLUID LEFT KNEE  Final   Special Requests NONE  Final   Gram Stain   Final    ABUNDANT WBC PRESENT,BOTH PMN AND MONONUCLEAR MODERATE GRAM POSITIVE COCCI IN PAIRS CRITICAL RESULT CALLED TO, READ BACK BY AND VERIFIED WITH: Dian Queen RN 15:15 01/11/17 ([email or web address])    Report Status 01/11/2017 FINAL  Final  Aerobic/Anaerobic Culture (surgical/deep wound)     Status: None (Preliminary result)   Collection Time: 01/11/17  6:39 PM  Result Value Ref Range Status   Specimen Description FLUID SYNOVIAL LEFT KNEE  Final   Special Requests NONE  Final   Gram Stain   Final    ABUNDANT WBC PRESENT, PREDOMINANTLY PMN FEW GRAM POSITIVE COCCI IN CLUSTERS    Culture   Final    FEW STAPHYLOCOCCUS AUREUS NO ANAEROBES ISOLATED; CULTURE IN PROGRESS FOR 5 DAYS    Report Status PENDING  Incomplete   Organism ID, Bacteria STAPHYLOCOCCUS AUREUS  Final      Susceptibility   Staphylococcus aureus - MIC*    CIPROFLOXACIN >=8 RESISTANT Resistant     ERYTHROMYCIN >=8 RESISTANT Resistant     GENTAMICIN <=0.5 SENSITIVE Sensitive     OXACILLIN 0.5 SENSITIVE Sensitive       TETRACYCLINE <=1 SENSITIVE Sensitive     VANCOMYCIN <=0.5 SENSITIVE Sensitive     TRIMETH/SULFA <=10 SENSITIVE Sensitive     CLINDAMYCIN >=8 RESISTANT Resistant     RIFAMPIN <=0.5 SENSITIVE Sensitive     Inducible Clindamycin NEGATIVE Sensitive     * FEW STAPHYLOCOCCUS AUREUS     Marcos Eke, NP [facility] [phone] Pager  01/15/2017  9:51 AM

## 2017-01-15 NOTE — Progress Notes (Signed)
  PROGRESS NOTE  Matthew [name] IYM:[id number] DOB: Jan 29, 1941 DOA: 01/11/2017 PCP: Billee Cashing, MD  Brief Narrative: 76yom presented with left knee pain. Admitted for septic knee arthritis.  Assessment/Plan Left knee septic arthritis. S/p surgery. Synovial fluid MSSA but aspiration in ED MRSA. - vancomycin total 4 weeks - WBAT. Left knee dressing changed every 3 days - ASA 325 mg daily - f/u with ID as an oupatient (ID will arrange) - f/u with Dr. Luiz Blare next week  DVT prophylaxis: SCDs Code Status: full Family Communication:  Disposition Plan: SNF 12/4    Brendia Sacks, MD  [facility] Direct contact: [phone] --Via amion app OR  --[email or web address]; password [email or web address]  7PM-7AM contact night coverage as above 01/15/2017, 7:07 PM  LOS: 4 days   Consultants:  Orthopedics   Procedures: 1. Partial lateral meniscectomy. 2. Chondroplasty medial, lateral, and patellofemoral. 3. Four-compartment synovectomy.  PICC lined placed 12/3   Antimicrobials:  Vancomycin   Interval history/Subjective: Feels ok.   Objective: Vitals:  Vitals:   01/15/17 0652 01/15/17 1500  BP: 122/63 (!) 120/51  Pulse: 72 80  Resp: 18 14  Temp: (!) 100.9 F (38.3 C) 99.6 F (37.6 C)  SpO2: 100% 100%    Exam:  Constitutional:  . Appears calm and comfortable Respiratory:  . CTA bilaterally, no w/r/r.  . Respiratory effort normal.  Cardiovascular:  . RRR, no m/r/g Psychiatric:  . Mental status o Mood, affect appropriate  I have personally reviewed the following:   Labs:  None    Scheduled Meds: . amLODipine  10 mg Oral Daily  . aspirin  325 mg Oral Daily  . atorvastatin  10 mg Oral Q2000  . carvedilol  25 mg Oral BID WC  . furosemide  40 mg Oral Daily  . ramipril  10 mg Oral BID   Continuous Infusions: . vancomycin 1,000 mg (01/15/17 1849)    Principal Problem:   Septic arthritis of knee, left (HCC) Active Problems:   Cardiomyopathy (HCC)    Obesity   Coronary atherosclerosis of native coronary artery   Mixed hyperlipidemia   Essential hypertension, benign   Normocytic anemia   LOS: 4 days

## 2017-01-15 NOTE — Telephone Encounter (Signed)
Patient calling, states that he has a few questions to discuss.

## 2017-01-15 NOTE — Progress Notes (Signed)
PT Cancellation Note  Patient Details Name: Matthew [name] MRN: [id number] DOB: 1940/05/04   Cancelled Treatment:     attempted to work with patient at 1100, patient refusing to participate despite encourgment citing knee being too painful. Asked that I return tomorrow will re attempt today if time permits.   Etta Grandchild, PT, DPT Acute Rehab Services Pager: [phone]     Etta Grandchild 01/15/2017, 11:10 AM

## 2017-01-15 NOTE — Progress Notes (Signed)
Occupational Therapy Treatment Patient Details Name: Matthew [name] MRN: [id number] DOB: 03-29-1940 Today's Date: 01/15/2017    History of present illness Pt is a 76 y/o male with a long history of L knee pain. Pt had an injection ~1 week PTA and presented to the ED with increased pain and swelling in the L knee. L knee was aspirated in the ED and was found to be septic. Pt is now s/p I&D of L knee, 4 compartment synovectomy, L menisectomy, L chondroplasty of medial patella compartment on 01/11/17.    OT comments  Pt completed oob to chair transfer this session and noted to have break down on R buttock area. Dressing applied to skin and RN notified. Pt with saturated bed linen but seemed unaware of the need to change linen. Question hygiene level at baseline. Pt with total (A) for peri care while in standing. Pt with decr activity tolerance and needed total +2 (A) for basic transfer.    Follow Up Recommendations  SNF    Equipment Recommendations       Recommendations for Other Services      Precautions / Restrictions Precautions Precautions: Fall;Knee Precaution Booklet Issued: No Restrictions Weight Bearing Restrictions: Yes LLE Weight Bearing: Weight bearing as tolerated       Mobility Bed Mobility Overal bed mobility: Needs Assistance Bed Mobility: Supine to Sit     Supine to sit: Mod assist     General bed mobility comments: pt needed (A) to advance LE to EOB , increase time and max effort from patient.   Transfers Overall transfer level: Needs assistance Equipment used: Rolling Gosling (2 wheeled) Transfers: Sit to/from Stand Sit to Stand: +2 physical assistance;Max assist Stand pivot transfers: Mod assist;+2 physical assistance;+2 safety/equipment;From elevated surface       General transfer comment: elevated surface required, rocking momentum and max cues for posture    Balance Overall balance assessment: Needs assistance Sitting-balance support:  Bilateral upper extremity supported;Feet supported Sitting balance-[name] Scale: Fair     Standing balance support: Bilateral upper extremity supported;During functional activity Standing balance-[name] Scale: Zero                             ADL either performed or assessed with clinical judgement   ADL Overall ADL's : Needs assistance/impaired Eating/Feeding: Set up;Sitting   Grooming: Set up;Sitting     Upper Body Bathing Details (indicate cue type and reason): tech (A)ing on arrival Lower Body Bathing: Maximal assistance;+2 for physical assistance;Sit to/from stand           Toilet Transfer: +2 for physical assistance;Moderate assistance;Stand-pivot;RW Toilet Transfer Details (indicate cue type and reason): stand pivot only   Toileting - Clothing Manipulation Details (indicate cue type and reason): total (A) with noted break down on R buttock       General ADL Comments: pt motivated to participate but very limited with transfers. pt needed (A) to power up , cues for hand placement and very kyphonic posture     Vision       Perception     Praxis      Cognition Arousal/Alertness: Awake/alert Behavior During Therapy: WFL for tasks assessed/performed Overall Cognitive Status: Within Functional Limits for tasks assessed  Exercises     Shoulder Instructions       General Comments      Pertinent Vitals/ Pain       Pain Assessment: No/denies pain  Home Living                                          Prior Functioning/Environment              Frequency  Min 2X/week        Progress Toward Goals  OT Goals(current goals can now be found in the care plan section)  Progress towards OT goals: Progressing toward goals  Acute Rehab OT Goals Patient Stated Goal: Get more independent OT Goal Formulation: With patient Time For Goal Achievement: 01/19/17 Potential to  Achieve Goals: Good ADL Goals Pt Will Perform Lower Body Bathing: with min assist;sit to/from stand Pt Will Perform Lower Body Dressing: with min assist;sit to/from stand Pt Will Transfer to Toilet: with min assist Pt Will Perform Toileting - Clothing Manipulation and hygiene: with min assist;sit to/from stand  Plan Discharge plan remains appropriate    Co-evaluation                 AM-PAC PT "6 Clicks" Daily Activity     Outcome Measure   Help from another person eating meals?: None Help from another person taking care of personal grooming?: None Help from another person toileting, which includes using toliet, bedpan, or urinal?: A Lot Help from another person bathing (including washing, rinsing, drying)?: A Lot Help from another person to put on and taking off regular upper body clothing?: A Little Help from another person to put on and taking off regular lower body clothing?: A Lot 6 Click Score: 17    End of Session Equipment Utilized During Treatment: Gait belt;Rolling Decesare  OT Visit Diagnosis: Unsteadiness on feet (R26.81);Pain   Activity Tolerance Patient tolerated treatment well   Patient Left in chair;with call bell/phone within reach   Nurse Communication Mobility status;Precautions        Time: [id number] OT Time Calculation (min): 37 min  Charges: OT General Charges $OT Visit: 1 Visit OT Treatments $Self Care/Home Management : 8-22 mins $Therapeutic Activity: 8-22 mins   Mateo FlowJones, Brynn   OTR/L Pager: [phone] Office: [phone] .    Boone MasterJones, Isabell Bonafede B 01/15/2017, 11:33 AM

## 2017-01-15 NOTE — Social Work (Signed)
CSW confirmed bed offer with SNF[facility].  RN f/u on the discharge plan as patient will need a picc line.  CSW discussed bed offer and patient confirmed acceptance of [facility] bed offer.  CSW will follow for disposition.  Keene Breath, LCSW Clinical Social Worker [phone]

## 2017-01-15 NOTE — Progress Notes (Addendum)
Subjective: 4 Days Post-Op Procedure(s) (LRB): ARTHROSCOPIC IRRIGATION AND DEBRIDMENT KNEE (Left) Four compartment SYNOVECTOMY (Left) Lateral MENISECTOMY (Left) CHONDROPLASTY of medial and patella compartment (Left) Patient reports pain as mild.  The  Patient reports that his knee is feeling better.  He refused the PICC line yesterday.  I think he was concerned that it went into/near his heart. He tells me today that he is okay to go ahead and do it.  Is ready to go to a skilled nursing facility once that occurs.Hemovac drains were pulled yesterday.  Objective: Vital signs in last 24 hours: Temp:  [99.1 F (37.3 C)-100.9 F (38.3 C)] 100.9 F (38.3 C) (12/03 0652) Pulse Rate:  [72-82] 72 (12/03 0652) Resp:  [16-18] 18 (12/03 0652) BP: (99-158)/(43-82) 122/63 (12/03 0652) SpO2:  [98 %-100 %] 100 % (12/03 0652)  Intake/Output from previous day: 12/02 0701 - 12/03 0700 In: 1130 [P.O.:1130] Out: 1100 [Urine:1100] Intake/Output this shift: No intake/output data recorded.  Recent Labs    01/13/17 0511  HGB 11.2*   Recent Labs    01/13/17 0511  WBC 11.6*  RBC 4.28  HCT 34.7*  PLT 203   Recent Labs    01/13/17 0511  NA 136  K 3.9  CL 104  CO2 24  BUN 12  CREATININE 0.95  GLUCOSE 112*  CALCIUM 8.2*   Cultures positive for methicillin-resistant staph. Left knee exam: Dressing clean and dry.moderate pain with range of motion of left knee. Mild swelling of the ankle.  Calf is soft and nontender.  Assessment/Plan: 4 Days Post-Op Procedure(s) (LRB): ARTHROSCOPIC IRRIGATION AND DEBRIDMENT KNEE (Left) Four compartment SYNOVECTOMY (Left) Lateral MENISECTOMY (Left) CHONDROPLASTY of medial and patella compartment (Left)  Septic arthritis left knee with cultures positive for methicillin-resistant staph Plan: I discussed with the patient the importance of getting a PICC line.  He agreed.  Once the PICC line is placed he can be discharged to a skilled nursing facility. Would  use aspirin 325 mg enteric-coated once daily for DVT prophylaxis. May weight-bear as tolerated on the left knee. We will need his left knee dressing changed every 3 days.  Cultures were positive for Methicillin resistant staph.It appears the pharmacy/ID  has plans to use IV vancomycin. We will need follow-up with Dr. Luiz Blare in 7-10 days.   Yuktha Kerchner G 01/15/2017, 8:52 AM

## 2017-01-15 NOTE — Telephone Encounter (Signed)
Pt has been in the hospital for several days. He wants for Dr. Abe People to arrange an IV peg so he can go home with IV antibiotics. Pt was made aware that he needs to ask his nurse in the hospital about that issue. Spoke with pt's nurse which she said she will take care of it. Pt is aware.

## 2017-01-15 NOTE — Progress Notes (Signed)
Peripherally Inserted Central Catheter/Midline Placement  The IV Nurse has discussed with the patient and/or persons authorized to consent for the patient, the purpose of this procedure and the potential benefits and risks involved with this procedure.  The benefits include less needle sticks, lab draws from the catheter, and the patient may be discharged home with the catheter. Risks include, but not limited to, infection, bleeding, blood clot (thrombus formation), and puncture of an artery; nerve damage and irregular heartbeat and possibility to perform a PICC exchange if needed/ordered by physician.  Alternatives to this procedure were also discussed.  Bard Power PICC patient education guide, fact sheet on infection prevention and patient information card has been provided to patient /or left at bedside.    PICC/Midline Placement Documentation  PICC Single Lumen 01/15/17 PICC Right Brachial 45 cm 0 cm (Active)  Indication for Insertion or Continuance of Line Prolonged intravenous therapies 01/15/2017  3:00 PM  Exposed Catheter (cm) 0 cm 01/15/2017  3:00 PM  Site Assessment Clean;Dry;Intact 01/15/2017  3:00 PM  Line Status Flushed;Blood return noted 01/15/2017  3:00 PM  Dressing Type Transparent 01/15/2017  3:00 PM  Dressing Status Clean;Dry;Intact;Antimicrobial disc in place 01/15/2017  3:00 PM  Dressing Intervention New dressing 01/15/2017  3:00 PM  Dressing Change Due 01/22/17 01/15/2017  3:00 PM       Matthew [name] 01/15/2017, 3:39 PM

## 2017-01-16 DIAGNOSIS — I2510 Atherosclerotic heart disease of native coronary artery without angina pectoris: Secondary | ICD-10-CM | POA: Diagnosis not present

## 2017-01-16 DIAGNOSIS — B999 Unspecified infectious disease: Secondary | ICD-10-CM | POA: Diagnosis not present

## 2017-01-16 DIAGNOSIS — G8911 Acute pain due to trauma: Secondary | ICD-10-CM | POA: Diagnosis not present

## 2017-01-16 DIAGNOSIS — L988 Other specified disorders of the skin and subcutaneous tissue: Secondary | ICD-10-CM | POA: Diagnosis not present

## 2017-01-16 DIAGNOSIS — Z9889 Other specified postprocedural states: Secondary | ICD-10-CM | POA: Diagnosis not present

## 2017-01-16 DIAGNOSIS — Z5181 Encounter for therapeutic drug level monitoring: Secondary | ICD-10-CM | POA: Diagnosis not present

## 2017-01-16 DIAGNOSIS — R2681 Unsteadiness on feet: Secondary | ICD-10-CM | POA: Diagnosis not present

## 2017-01-16 DIAGNOSIS — M6281 Muscle weakness (generalized): Secondary | ICD-10-CM | POA: Diagnosis not present

## 2017-01-16 DIAGNOSIS — R4181 Age-related cognitive decline: Secondary | ICD-10-CM | POA: Diagnosis not present

## 2017-01-16 DIAGNOSIS — Z5189 Encounter for other specified aftercare: Secondary | ICD-10-CM | POA: Diagnosis not present

## 2017-01-16 DIAGNOSIS — I10 Essential (primary) hypertension: Secondary | ICD-10-CM | POA: Diagnosis not present

## 2017-01-16 DIAGNOSIS — S8990XA Unspecified injury of unspecified lower leg, initial encounter: Secondary | ICD-10-CM | POA: Diagnosis not present

## 2017-01-16 DIAGNOSIS — M00869 Arthritis due to other bacteria, unspecified knee: Secondary | ICD-10-CM | POA: Diagnosis not present

## 2017-01-16 DIAGNOSIS — R278 Other lack of coordination: Secondary | ICD-10-CM | POA: Diagnosis not present

## 2017-01-16 DIAGNOSIS — M00062 Staphylococcal arthritis, left knee: Secondary | ICD-10-CM | POA: Diagnosis not present

## 2017-01-16 DIAGNOSIS — Z95828 Presence of other vascular implants and grafts: Secondary | ICD-10-CM | POA: Diagnosis not present

## 2017-01-16 DIAGNOSIS — I509 Heart failure, unspecified: Secondary | ICD-10-CM | POA: Diagnosis not present

## 2017-01-16 DIAGNOSIS — M009 Pyogenic arthritis, unspecified: Secondary | ICD-10-CM | POA: Diagnosis not present

## 2017-01-16 DIAGNOSIS — M62562 Muscle wasting and atrophy, not elsewhere classified, left lower leg: Secondary | ICD-10-CM | POA: Diagnosis not present

## 2017-01-16 DIAGNOSIS — I429 Cardiomyopathy, unspecified: Secondary | ICD-10-CM | POA: Diagnosis not present

## 2017-01-16 DIAGNOSIS — M129 Arthropathy, unspecified: Secondary | ICD-10-CM | POA: Diagnosis not present

## 2017-01-16 DIAGNOSIS — R6520 Severe sepsis without septic shock: Secondary | ICD-10-CM | POA: Diagnosis not present

## 2017-01-16 DIAGNOSIS — R600 Localized edema: Secondary | ICD-10-CM | POA: Diagnosis not present

## 2017-01-16 DIAGNOSIS — R41841 Cognitive communication deficit: Secondary | ICD-10-CM | POA: Diagnosis not present

## 2017-01-16 DIAGNOSIS — A419 Sepsis, unspecified organism: Secondary | ICD-10-CM | POA: Diagnosis not present

## 2017-01-16 DIAGNOSIS — E786 Lipoprotein deficiency: Secondary | ICD-10-CM | POA: Diagnosis not present

## 2017-01-16 LAB — BASIC METABOLIC PANEL
ANION GAP: 9 (ref 5–15)
BUN: 11 mg/dL (ref 6–20)
CALCIUM: 8.3 mg/dL — AB (ref 8.9–10.3)
CO2: 25 mmol/L (ref 22–32)
Chloride: 98 mmol/L — ABNORMAL LOW (ref 101–111)
Creatinine, Ser: 0.95 mg/dL (ref 0.61–1.24)
Glucose, Bld: 111 mg/dL — ABNORMAL HIGH (ref 65–99)
POTASSIUM: 4 mmol/L (ref 3.5–5.1)
SODIUM: 132 mmol/L — AB (ref 135–145)

## 2017-01-16 LAB — AEROBIC/ANAEROBIC CULTURE W GRAM STAIN (SURGICAL/DEEP WOUND)

## 2017-01-16 LAB — AEROBIC/ANAEROBIC CULTURE (SURGICAL/DEEP WOUND)

## 2017-01-16 MED ORDER — ASPIRIN 325 MG PO TABS: 325.0000 mg | ORAL_TABLET | Freq: Every day | ORAL | Status: DC

## 2017-01-16 MED ORDER — VANCOMYCIN IV (FOR PTA / DISCHARGE USE ONLY): 1000.0000 mg | Freq: Two times a day (BID) | INTRAVENOUS | 0 refills | Status: DC

## 2017-01-16 NOTE — Discharge Summary (Signed)
Physician Discharge Summary  Matthew [name] HFW:[id number] DOB: 08-Jul-1940 DOA: 01/11/2017  PCP: Matthew Hey, MD  Admit date: 01/11/2017 Discharge date: 01/16/2017  Recommendations for Outpatient Follow-up:  Left knee septic arthritis.  - vancomycin total 4 weeks - WBAT. Left knee dressing changed every 3 days - ASA 325 mg daily - f/u with ID as an oupatient (ID will arrange) - f/u with Matthew [name] next week     Contact information for follow-up providers    Dorna Leitz, MD. Schedule an appointment as soon as possible for a visit in 10 day(s).   Specialty:  Orthopedic Surgery Contact information: [place] [address] [phone]        Thayer Headings, MD. Schedule an appointment as soon as possible for a visit in 1 week(s).   Specialty:  Infectious Diseases Contact information: [address] [address] [phone]            Contact information for after-discharge care    Destination    [facility] Follow up.   Service:  Skilled Nursing Contact information: [place] [phone]                   Discharge Diagnoses:  1. Left knee septic arthritis  Discharge Condition: improved Disposition: short-term SNF  Diet recommendation: heart healthy  Filed Weights   01/11/17 1705  Weight: 130.6 kg (288 lb)    History of present illness:  76yom presented with left knee pain. Admitted for septic knee arthritis.  Hospital Course:  Patient was started on IV abx and seen by orthopedic surgery. Underwent surgery without apparent complication. Seen by ID with recommendation of 4 weeks IV Vancomycin.   Left knee septic arthritis. S/p surgery. Synovial fluid MSSA but aspiration in ED MRSA. - vancomycin total 4 weeks - WBAT. Left knee dressing changed every 3 days - ASA 325 mg daily - f/u with ID as an oupatient (ID will arrange) - f/u with  Matthew [name] next week  Consultants:  Orthopedics   Procedures: 1. Partial lateral meniscectomy. 2. Chondroplasty medial, lateral, and patellofemoral. 3. Four-compartment synovectomy.  PICC lined placed 12/3   Antimicrobials:  Vancomycin    Today's assessment: S: feels ok. Eating fine. Pain controlled. O: Vitals:  Vitals:   01/16/17 0801 01/16/17 0937  BP: (!) 103/54 (!) 105/51  Pulse: 66 70  Resp: 16 18  Temp: 98.4 F (36.9 C)   SpO2: 99% 99%    Constitutional:  . Appears calm and comfortable Respiratory:  . CTA bilaterally, no w/r/r.  . Respiratory effort normal.  Cardiovascular:  . Regularly irregular, no m/r/g. Telemetry bigeminy. . No LE extremity edema   Psychiatric:  . judgement and insight appear normal . Mental status o Mood, affect appropriate  Discharge Instructions  Discharge Instructions    Home infusion instructions [facility] May follow Matthew [name]; May administer Cathflo as needed to maintain patency of vascular access device.; Flushing of vascular access device: per [facility] [name]: 0.9% NaCl pre/post medica...   Complete by:  As directed    Instructions:  May follow [place] Dosing [name]   Instructions:  May administer Cathflo as needed to maintain patency of vascular access device.   Instructions:  Flushing of vascular access device: per [facility] [name]: 0.9% NaCl pre/post medication administration and prn patency; Heparin 100 u/ml, 42m for implanted ports and Heparin 10u/ml, 571mfor all other central venous catheters.  Instructions:  May follow [facility] Anaphylaxis [name] for First Dose Administration in the home: 0.9% NaCl at 25-50 ml/hr to maintain IV access for [name] meds. Epinephrine 0.3 ml IV/IM PRN and Benadryl 25-50 IV/IM PRN s/s of anaphylaxis.   Instructions:  [name] (RN) to assist per patient IV care needs in the home PRN.   Weight bearing as tolerated   Complete by:  As  directed    Laterality:  left   Extremity:  Lower     Allergies as of 01/16/2017   No Known Allergies     Medication List    STOP taking these medications   traMADol 50 MG tablet Commonly known as:  ULTRAM     TAKE these medications   amLODipine 10 MG tablet Commonly known as:  NORVASC take 1 tablet by mouth once daily What changed:    how much to take  how to take this  when to take this   aspirin 325 MG tablet Take 1 tablet (325 mg total) by mouth daily. Start taking on:  01/17/2017 What changed:    medication strength  how much to take   atorvastatin 10 MG tablet Commonly known as:  LIPITOR take 1 tablet by mouth once daily What changed:    how much to take  how to take this  when to take this   carvedilol 25 MG tablet Commonly known as:  COREG take 1 tablet by mouth twice a day with meals What changed:    how much to take  how to take this  when to take this   furosemide 40 MG tablet Commonly known as:  LASIX Take 1 tablet (40 mg total) by mouth daily.   HYDROcodone-acetaminophen 5-325 MG tablet Commonly known as:  NORCO/VICODIN Take 1-2 tablets by mouth every 6 (six) hours as needed for moderate pain.   ramipril 10 MG capsule Commonly known as:  ALTACE take 1 capsule by mouth twice a day What changed:    how much to take  how to take this  when to take this   sildenafil 100 MG tablet Commonly known as:  VIAGRA Take 1 tablet (100 mg total) by mouth daily as needed for erectile dysfunction.   vancomycin IVPB Inject 1,000 mg into the vein every 12 (twelve) hours. Indication:  Septic arthritis/ synovial infection Last Day of Therapy:  02/07/17 Labs - Sunday/Monday:  CBC/D, BMP, and vancomycin trough. Labs - Thursday:  BMP and vancomycin trough Labs - Every other week:  ESR and CRP            Home Infusion Instuctions  (From admission, onward)        Start     Ordered   01/16/17 0000  Home infusion instructions  [facility] May follow [place] Dosing [name]; May administer Cathflo as needed to maintain patency of vascular access device.; Flushing of vascular access device: per [facility] [name]: 0.9% NaCl pre/post medica...    Question Answer Comment  Instructions May follow [place] Dosing [name]   Instructions May administer Cathflo as needed to maintain patency of vascular access device.   Instructions Flushing of vascular access device: per [facility] [name]: 0.9% NaCl pre/post medication administration and prn patency; Heparin 100 u/ml, 74m for implanted ports and Heparin 10u/ml, 525mfor all other central venous catheters.   Instructions May follow [facility] Anaphylaxis [name] for First Dose Administration in the home: 0.9% NaCl at 25-50 ml/hr to maintain IV access for [name] meds.  Epinephrine 0.3 ml IV/IM PRN and Benadryl 25-50 IV/IM PRN s/s of anaphylaxis.   Instructions [facility] Infusion Coordinator (RN) to assist per patient IV care needs in the home PRN.      01/16/17 1124       Discharge Care Instructions  (From admission, onward)        Start     Ordered   01/11/17 0000  Weight bearing as tolerated    Question Answer Comment  Laterality left   Extremity Lower      01/11/17 1941     No Known Allergies  The results of significant diagnostics from this hospitalization (including imaging, microbiology, ancillary and laboratory) are listed below for reference.    Significant Diagnostic Studies: Dg Chest Port 1 View  Result Date: 01/11/2017 CLINICAL DATA:  76 year old male with recent knee injection. Increased pain and swelling. EXAM: PORTABLE CHEST 1 VIEW COMPARISON:  Report of chest radiographs 11/12/1999 (no images available). FINDINGS: Portable AP semi upright view at 1545 hours. Low lung volumes. Mild cardiomegaly. Tortuous thoracic aorta. Calcified aortic atherosclerosis. Other mediastinal contours are within normal limits. Visualized tracheal air column is  within normal limits. No pneumothorax, pulmonary edema, pleural effusion or confluent pulmonary opacity. IMPRESSION: Low lung volumes.  No acute cardiopulmonary abnormality identified. Electronically Signed   By: Genevie Ann M.D.   On: 01/11/2017 16:06   Dg Knee Complete 4 Views Left  Result Date: 01/11/2017 CLINICAL DATA:  Pain and swelling beginning yesterday, had injection knee in knee on Monday, today unable to move leg, history diabetes mellitus, hypertension, heart failure EXAM: LEFT KNEE - COMPLETE 4+ VIEW COMPARISON:  None FINDINGS: Osseous demineralization. Tricompartmental osteoarthritic changes with joint space narrowing and spur formation greatest at patellofemoral joint. No acute fracture, dislocation, or bone destruction. No significant knee joint effusion. Scattered atherosclerotic calcifications. IMPRESSION: Advanced tricompartmental osteoarthritic changes of the LEFT knee. Electronically Signed   By: Lavonia Dana M.D.   On: 01/11/2017 10:08    Microbiology: Recent Results (from the past 240 hour(s))  Culture, body fluid-bottle     Status: Abnormal   Collection Time: 01/11/17 11:26 AM  Result Value Ref Range Status   Specimen Description FLUID LEFT KNEE  Final   Special Requests NONE  Final   Gram Stain   Final    GRAM POSITIVE COCCI IN CLUSTERS IN BOTH AEROBIC AND ANAEROBIC BOTTLES    Culture METHICILLIN RESISTANT STAPHYLOCOCCUS AUREUS (A)  Final   Report Status 01/14/2017 FINAL  Final   Organism ID, Bacteria METHICILLIN RESISTANT STAPHYLOCOCCUS AUREUS  Final      Susceptibility   Methicillin resistant staphylococcus aureus - MIC*    CIPROFLOXACIN >=8 RESISTANT Resistant     ERYTHROMYCIN >=8 RESISTANT Resistant     GENTAMICIN <=0.5 SENSITIVE Sensitive     OXACILLIN RESISTANT Resistant     TETRACYCLINE <=1 SENSITIVE Sensitive     VANCOMYCIN <=0.5 SENSITIVE Sensitive     TRIMETH/SULFA <=10 SENSITIVE Sensitive     CLINDAMYCIN >=8 RESISTANT Resistant     RIFAMPIN <=0.5  SENSITIVE Sensitive     Inducible Clindamycin NEGATIVE Sensitive     * METHICILLIN RESISTANT STAPHYLOCOCCUS AUREUS  Gram stain     Status: None   Collection Time: 01/11/17 11:26 AM  Result Value Ref Range Status   Specimen Description FLUID LEFT KNEE  Final   Special Requests NONE  Final   Gram Stain   Final    ABUNDANT WBC PRESENT,BOTH PMN AND MONONUCLEAR MODERATE GRAM POSITIVE COCCI IN  PAIRS CRITICAL RESULT CALLED TO, READ BACK BY AND VERIFIED WITH: Gilberto Better RN 15:15 01/11/17 ([email or web address])    Report Status 01/11/2017 FINAL  Final  Aerobic/Anaerobic Culture (surgical/deep wound)     Status: None (Preliminary result)   Collection Time: 01/11/17  6:39 PM  Result Value Ref Range Status   Specimen Description FLUID SYNOVIAL LEFT KNEE  Final   Special Requests NONE  Final   Gram Stain   Final    ABUNDANT WBC PRESENT, PREDOMINANTLY PMN FEW GRAM POSITIVE COCCI IN CLUSTERS    Culture   Final    FEW STAPHYLOCOCCUS AUREUS NO ANAEROBES ISOLATED; CULTURE IN PROGRESS FOR 5 DAYS    Report Status PENDING  Incomplete   Organism ID, Bacteria STAPHYLOCOCCUS AUREUS  Final      Susceptibility   Staphylococcus aureus - MIC*    CIPROFLOXACIN >=8 RESISTANT Resistant     ERYTHROMYCIN >=8 RESISTANT Resistant     GENTAMICIN <=0.5 SENSITIVE Sensitive     OXACILLIN 0.5 SENSITIVE Sensitive     TETRACYCLINE <=1 SENSITIVE Sensitive     VANCOMYCIN <=0.5 SENSITIVE Sensitive     TRIMETH/SULFA <=10 SENSITIVE Sensitive     CLINDAMYCIN >=8 RESISTANT Resistant     RIFAMPIN <=0.5 SENSITIVE Sensitive     Inducible Clindamycin NEGATIVE Sensitive     * FEW STAPHYLOCOCCUS AUREUS     Labs: Basic Metabolic Panel: Recent Labs  Lab 01/11/17 1036 01/12/17 0432 01/12/17 1431 01/13/17 0511 01/16/17 0415  NA 133* 134*  --  136 132*  K 4.2 3.9  --  3.9 4.0  CL 100* 103  --  104 98*  CO2 25 25  --  24 25  GLUCOSE 103* 125*  --  112* 111*  BUN 16 12  --  12 11  CREATININE 1.04 0.98  --  0.95 0.95  CALCIUM 8.6*  8.0*  --  8.2* 8.3*  MG  --   --  2.1 1.9  --   PHOS  --   --  3.2 3.3  --    Liver Function Tests: Recent Labs  Lab 01/13/17 0511  AST 41  ALT 17  ALKPHOS 72  BILITOT 0.6  PROT 6.1*  ALBUMIN 2.4*   CBC: Recent Labs  Lab 01/11/17 1036 01/12/17 0432 01/13/17 0511  WBC 11.2* 12.5* 11.6*  NEUTROABS 8.6*  --  8.8*  HGB 13.1 11.8* 11.2*  HCT 39.8 37.4* 34.7*  MCV 81.1 80.6 81.1  PLT 209 192 203    Principal Problem:   Septic arthritis of knee, left (HCC) Active Problems:   Cardiomyopathy (HCC)   Obesity   Coronary atherosclerosis of native coronary artery   Mixed hyperlipidemia   Essential hypertension, benign   Normocytic anemia   Time coordinating discharge: 35 minutes  Signed:  Murray Hodgkins, MD [facility] 01/16/2017, 11:24 AM

## 2017-01-16 NOTE — Social Work (Addendum)
CSW called [facility] and was advised that they no longer have a bed that they offered.  CSW will discuss options with patient and family.  2:13pm CSW discussed bed offers with patient.Pt selected [facility].  CSW called daughter and let her know the SNF that dad selected. Patient and family in agreement.   CSW contacted [facility] in admissions who confirmed they can offer a bed. CSW sent DC summary.  Keene Breath, LCSW Clinical Social Worker [phone]

## 2017-01-16 NOTE — Social Work (Signed)
Clinical Social Worker facilitated patient discharge including contacting patient family and facility to confirm patient discharge plans.  Clinical information faxed to facility and family agreeable with plan.    CSW arranged ambulance transport via [facility] to [facility].    RN to call [phone] to give report prior to discharge.  Clinical Social Worker will sign off for now as social work intervention is no longer needed. Please consult us again if new need arises.  Willer Osorno, LCSW Clinical Social Worker [phone]    

## 2017-01-16 NOTE — Social Work (Signed)
CSW received a call back from daughter indicating that she would like to consider another SNF-[facility].  CSW will f/u with SNF if they can offer bed for placement today.  Keene Breath, LCSW Clinical Social Worker [phone]

## 2017-01-16 NOTE — Clinical Social Work Placement (Addendum)
   CLINICAL SOCIAL WORK PLACEMENT  NOTE  Date:  01/16/2017  Patient Details  Name: Matthew [name] MRN: [id number] Date of Birth: 1940-10-25  Clinical Social Work is seeking post-discharge placement for this patient at the Skilled  Nursing Facility level of care (*CSW will initial, date and re-position this form in  chart as items are completed):  Yes   Patient/family provided with [facility] Clinical Social Work Department's list of facilities offering this level of care within the geographic area requested by the patient (or if unable, by the patient's family).  Yes   Patient/family informed of their freedom to choose among providers that offer the needed level of care, that participate in Medicare, Medicaid or managed care program needed by the patient, have an available bed and are willing to accept the patient.  Yes   Patient/family informed of [facility]'s ownership interest in [facility] and [facility], as well as of the fact that they are under no obligation to receive care at these facilities.  PASRR submitted to EDS on 01/12/17     PASRR number received on 01/12/17     Existing PASRR number confirmed on       FL2 transmitted to all facilities in geographic area requested by pt/family on 01/12/17     FL2 transmitted to all facilities within larger geographic area on       Patient informed that his/her managed care company has contracts with or will negotiate with certain facilities, including the following:        Yes   Patient/family informed of bed offers received.  Patient chooses bed at [facility]     Physician recommends and patient chooses bed at      Patient to be transferred to [facility] on 01/16/17.  Patient to be transferred to facility by [facility]     Patient family notified on 01/16/17 of transfer.  Name of family member notified:  pt responsible for self  & daughter [name] advised of discharge   PHYSICIAN Please sign FL2     Additional  Comment:    _______________________________________________ Tresa Moore, LCSW 01/16/2017, 1:40 PM

## 2017-01-16 NOTE — Progress Notes (Signed)
PT Cancellation Note  Patient Details Name: BELTON ALTAMIRA MRN: [id number] DOB: 1940-09-15   Cancelled Treatment:    Reason Eval/Treat Not Completed: Patient declined, no reason specified Patient declined despite encouragement. Pt focused on getting prepared for d/c to SNF.    Derek Mound, PTA Pager: [phone]   01/16/2017, 11:49 AM

## 2017-01-16 NOTE — Progress Notes (Signed)
Matthew [name] to be D/C'd Skilled nursing facility per MD order.  Discussed prescriptions and follow up appointments with the patient. Prescriptions given to patient, medication list explained in detail. Pt verbalized understanding.  Allergies as of 01/16/2017   No Known Allergies     Medication List    STOP taking these medications   traMADol 50 MG tablet Commonly known as:  ULTRAM     TAKE these medications   amLODipine 10 MG tablet Commonly known as:  NORVASC take 1 tablet by mouth once daily What changed:    how much to take  how to take this  when to take this   aspirin 325 MG tablet Take 1 tablet (325 mg total) by mouth daily. Start taking on:  01/17/2017 What changed:    medication strength  how much to take   atorvastatin 10 MG tablet Commonly known as:  LIPITOR take 1 tablet by mouth once daily What changed:    how much to take  how to take this  when to take this   carvedilol 25 MG tablet Commonly known as:  COREG take 1 tablet by mouth twice a day with meals What changed:    how much to take  how to take this  when to take this   furosemide 40 MG tablet Commonly known as:  LASIX Take 1 tablet (40 mg total) by mouth daily.   HYDROcodone-acetaminophen 5-325 MG tablet Commonly known as:  NORCO/VICODIN Take 1-2 tablets by mouth every 6 (six) hours as needed for moderate pain.   ramipril 10 MG capsule Commonly known as:  ALTACE take 1 capsule by mouth twice a day What changed:    how much to take  how to take this  when to take this   sildenafil 100 MG tablet Commonly known as:  VIAGRA Take 1 tablet (100 mg total) by mouth daily as needed for erectile dysfunction.   vancomycin IVPB Inject 1,000 mg into the vein every 12 (twelve) hours. Indication:  Septic arthritis/ synovial infection Last Day of Therapy:  02/07/17 Labs - _0 /04/18 1124       Discharge Care Instructions  (From admission, onward)  Start     Ordered   01/11/17 0000  Weight bearing as tolerated    Question Answer Comment  Laterality left   Extremity Lower      01/11/17 1941      Vitals:   01/16/17 0937 01/16/17 1408  BP: (!) 105/51 (!) 110/45  Pulse: 70 (!) 39  Resp: 18 16  Temp:  98.9 F (37.2 C)  SpO2: 99% 99%    Skin clean, dry and intact without evidence of skin break down, no evidence of skin tears noted, On left knee ACE wrap is applied, clean, dry, and intact. IV catheter discontinued intact. Site without signs and symptoms of complications. Dressing and pressure applied. Pt denies pain at this time. No complaints noted.  An After Visit Summary and prescriptions were  printed and given to [facility]. Report called and given to Gerhard Munch, RN Patient escorted via bed, and D/C to SNF via [facility].  [place] RN

## 2017-01-16 NOTE — Care Management Note (Addendum)
Case Management Note  Patient Details  Name: NICKOLAUS LITWAK MRN: [id number] Date of Birth: 1940-05-25  Subjective/Objective:   left knee septic arthritis   Action/Plan: Discharge Planning: Chart reviewed. CSW following for SNF rehab and IV abx,  PCP Billee Cashing MD  Expected Discharge Date:                  Expected Discharge Plan:  Skilled Nursing Facility  In-House Referral:  Clinical Social Work  Discharge planning Services  CM Consult  Post Acute Care Choice:  NA Choice offered to:  NA  DME Arranged:  N/A DME Agency:  NA  HH Arranged:  NA HH Agency:  NA  Status of Service:  Completed, signed off  If discussed at Long Length of Stay Meetings, dates discussed:  01/16/2017  Additional Comments:  Elliot Cousin, RN 01/16/2017, 8:30 AM

## 2017-01-16 NOTE — Progress Notes (Signed)
Called [facility] and gave report to Ronda. Provided my phone number if any questions or concerns arise.

## 2017-01-17 LAB — HEPATITIS C ANTIBODY: HCV Ab: <0.1 s/co ratio (ref 0.0–0.9)

## 2017-01-19 DIAGNOSIS — I10 Essential (primary) hypertension: Secondary | ICD-10-CM | POA: Diagnosis not present

## 2017-01-19 DIAGNOSIS — E786 Lipoprotein deficiency: Secondary | ICD-10-CM | POA: Diagnosis not present

## 2017-01-19 DIAGNOSIS — M00869 Arthritis due to other bacteria, unspecified knee: Secondary | ICD-10-CM | POA: Diagnosis not present

## 2017-01-25 DIAGNOSIS — Z9889 Other specified postprocedural states: Secondary | ICD-10-CM | POA: Diagnosis not present

## 2017-01-30 ENCOUNTER — Ambulatory Visit (INDEPENDENT_AMBULATORY_CARE_PROVIDER_SITE_OTHER): Payer: Medicare Other | Admitting: Internal Medicine

## 2017-01-30 ENCOUNTER — Encounter: Payer: Self-pay | Admitting: Internal Medicine

## 2017-01-30 VITALS — BP 122/69 | HR 69 | Temp 97.8°F

## 2017-01-30 DIAGNOSIS — M00062 Staphylococcal arthritis, left knee: Secondary | ICD-10-CM | POA: Diagnosis not present

## 2017-01-30 DIAGNOSIS — I2510 Atherosclerotic heart disease of native coronary artery without angina pectoris: Secondary | ICD-10-CM

## 2017-01-30 DIAGNOSIS — Z5181 Encounter for therapeutic drug level monitoring: Secondary | ICD-10-CM

## 2017-01-30 DIAGNOSIS — Z95828 Presence of other vascular implants and grafts: Secondary | ICD-10-CM

## 2017-01-30 NOTE — Assessment & Plan Note (Signed)
No labs available regarding the vancomycin levels, creat.  I have requested them from his facility today and will review when available.

## 2017-01-30 NOTE — Progress Notes (Signed)
   Subjective:    Patient ID: Matthew [name], male    DOB: 1940/04/08, 75 y.o.   MRN: [id number]  HPI Here for hsfu for septic arthritis.  Has native joint septic arthritis with Staph aureus.   Initial aspiration culture grew MRSA, though operative culture was MSSA.  He was continued on vancomycin for a projected 4 weeks through December 26th.  He has had some bilateral leg swelling and some left knee swelling and some minimal warmth but is having no pain, is walking with a Moshier and improving ambulation by his report.  Here for routine follow up.  He is in a rehab center and no labs available of his vancomycin levels, CRP or ESR or other associated labs.  He is having a rash on his groin area and getting Nystatin powder for it.  No other rash.  No diarrhea.     Review of Systems  Constitutional: Negative for fatigue.  Gastrointestinal: Negative for diarrhea and nausea.  Skin: Negative for rash.       Objective:   Physical Exam  Constitutional: He appears well-developed and well-nourished. No distress.  HENT:  Mouth/Throat: No oropharyngeal exudate.  Eyes: No scleral icterus.  Cardiovascular: Normal rate, regular rhythm and normal heart sounds.  No murmur heard. Pulmonary/Chest: Effort normal and breath sounds normal. No respiratory distress.  Musculoskeletal:  Left knee with some warmth, no tenderness, some minimal swelling  Skin: No rash noted.   SH: staying at rehab now      Assessment & Plan:

## 2017-01-30 NOTE — Assessment & Plan Note (Signed)
Doing well with no issues with the picc. Can be removed at the end of treatment

## 2017-01-30 NOTE — Assessment & Plan Note (Addendum)
Overall improving clinically as he is walking on it, no pain and no concerns.   Will get recent labs from the rehab center and review ESR and CRP.  If no significant concerns will plan on stopping on 12/26  He will return prn unless concerns  ADDENDUM 01/30/17: labs reviewed, his CRP was done but a cardiac high sensitivity CRP so unclear of meaning, also in the setting of increased creat with increased trough.  No ESR available.  Normal WBC.  Will continue with plan as above to stop antibiotics at planned stop date based on improved clinical response.  Will ask for an ESR with next labs this week and review again.

## 2017-02-08 DIAGNOSIS — I509 Heart failure, unspecified: Secondary | ICD-10-CM | POA: Diagnosis not present

## 2017-02-08 DIAGNOSIS — R600 Localized edema: Secondary | ICD-10-CM | POA: Diagnosis not present

## 2017-02-09 DIAGNOSIS — L988 Other specified disorders of the skin and subcutaneous tissue: Secondary | ICD-10-CM | POA: Diagnosis not present

## 2017-02-12 ENCOUNTER — Telehealth: Payer: Self-pay | Admitting: Interventional Cardiology

## 2017-02-12 DIAGNOSIS — A419 Sepsis, unspecified organism: Secondary | ICD-10-CM | POA: Diagnosis not present

## 2017-02-12 DIAGNOSIS — M129 Arthropathy, unspecified: Secondary | ICD-10-CM | POA: Diagnosis not present

## 2017-02-12 DIAGNOSIS — I429 Cardiomyopathy, unspecified: Secondary | ICD-10-CM | POA: Diagnosis not present

## 2017-02-12 DIAGNOSIS — E786 Lipoprotein deficiency: Secondary | ICD-10-CM | POA: Diagnosis not present

## 2017-02-12 DIAGNOSIS — I509 Heart failure, unspecified: Secondary | ICD-10-CM | POA: Diagnosis not present

## 2017-02-12 DIAGNOSIS — I10 Essential (primary) hypertension: Secondary | ICD-10-CM | POA: Diagnosis not present

## 2017-02-12 NOTE — Telephone Encounter (Signed)
New message  Pt verbalized that he is calling for the rn  did not disclose reason

## 2017-02-12 NOTE — Telephone Encounter (Signed)
Patient states that he was recently in a rehab facility for an infection in his knee. He states that he was recently discharged and is having knee pain again. Made patient aware that he needs to follow up with his PCP regarding this issue. Patient verbalized understanding.

## 2017-02-16 DIAGNOSIS — I509 Heart failure, unspecified: Secondary | ICD-10-CM | POA: Diagnosis not present

## 2017-02-16 DIAGNOSIS — I429 Cardiomyopathy, unspecified: Secondary | ICD-10-CM | POA: Diagnosis not present

## 2017-02-16 DIAGNOSIS — E669 Obesity, unspecified: Secondary | ICD-10-CM | POA: Diagnosis not present

## 2017-02-16 DIAGNOSIS — I2510 Atherosclerotic heart disease of native coronary artery without angina pectoris: Secondary | ICD-10-CM | POA: Diagnosis not present

## 2017-02-16 DIAGNOSIS — Z9181 History of falling: Secondary | ICD-10-CM | POA: Diagnosis not present

## 2017-02-16 DIAGNOSIS — Z4789 Encounter for other orthopedic aftercare: Secondary | ICD-10-CM | POA: Diagnosis not present

## 2017-02-16 DIAGNOSIS — I110 Hypertensive heart disease with heart failure: Secondary | ICD-10-CM | POA: Diagnosis not present

## 2017-02-16 DIAGNOSIS — D649 Anemia, unspecified: Secondary | ICD-10-CM | POA: Diagnosis not present

## 2017-02-16 DIAGNOSIS — Z7982 Long term (current) use of aspirin: Secondary | ICD-10-CM | POA: Diagnosis not present

## 2017-02-16 DIAGNOSIS — E782 Mixed hyperlipidemia: Secondary | ICD-10-CM | POA: Diagnosis not present

## 2017-02-19 DIAGNOSIS — I429 Cardiomyopathy, unspecified: Secondary | ICD-10-CM | POA: Diagnosis not present

## 2017-02-19 DIAGNOSIS — I110 Hypertensive heart disease with heart failure: Secondary | ICD-10-CM | POA: Diagnosis not present

## 2017-02-19 DIAGNOSIS — Z4789 Encounter for other orthopedic aftercare: Secondary | ICD-10-CM | POA: Diagnosis not present

## 2017-02-19 DIAGNOSIS — I2510 Atherosclerotic heart disease of native coronary artery without angina pectoris: Secondary | ICD-10-CM | POA: Diagnosis not present

## 2017-02-19 DIAGNOSIS — D649 Anemia, unspecified: Secondary | ICD-10-CM | POA: Diagnosis not present

## 2017-02-19 DIAGNOSIS — I509 Heart failure, unspecified: Secondary | ICD-10-CM | POA: Diagnosis not present

## 2017-02-21 DIAGNOSIS — I110 Hypertensive heart disease with heart failure: Secondary | ICD-10-CM | POA: Diagnosis not present

## 2017-02-21 DIAGNOSIS — I509 Heart failure, unspecified: Secondary | ICD-10-CM | POA: Diagnosis not present

## 2017-02-21 DIAGNOSIS — I429 Cardiomyopathy, unspecified: Secondary | ICD-10-CM | POA: Diagnosis not present

## 2017-02-21 DIAGNOSIS — I2510 Atherosclerotic heart disease of native coronary artery without angina pectoris: Secondary | ICD-10-CM | POA: Diagnosis not present

## 2017-02-21 DIAGNOSIS — D649 Anemia, unspecified: Secondary | ICD-10-CM | POA: Diagnosis not present

## 2017-02-21 DIAGNOSIS — Z4789 Encounter for other orthopedic aftercare: Secondary | ICD-10-CM | POA: Diagnosis not present

## 2017-02-22 DIAGNOSIS — M25562 Pain in left knee: Secondary | ICD-10-CM | POA: Diagnosis not present

## 2017-02-23 DIAGNOSIS — I429 Cardiomyopathy, unspecified: Secondary | ICD-10-CM | POA: Diagnosis not present

## 2017-02-23 DIAGNOSIS — I110 Hypertensive heart disease with heart failure: Secondary | ICD-10-CM | POA: Diagnosis not present

## 2017-02-23 DIAGNOSIS — I509 Heart failure, unspecified: Secondary | ICD-10-CM | POA: Diagnosis not present

## 2017-02-23 DIAGNOSIS — D649 Anemia, unspecified: Secondary | ICD-10-CM | POA: Diagnosis not present

## 2017-02-23 DIAGNOSIS — Z4789 Encounter for other orthopedic aftercare: Secondary | ICD-10-CM | POA: Diagnosis not present

## 2017-02-23 DIAGNOSIS — I2510 Atherosclerotic heart disease of native coronary artery without angina pectoris: Secondary | ICD-10-CM | POA: Diagnosis not present

## 2017-02-26 DIAGNOSIS — Z4789 Encounter for other orthopedic aftercare: Secondary | ICD-10-CM | POA: Diagnosis not present

## 2017-02-26 DIAGNOSIS — D649 Anemia, unspecified: Secondary | ICD-10-CM | POA: Diagnosis not present

## 2017-02-26 DIAGNOSIS — I509 Heart failure, unspecified: Secondary | ICD-10-CM | POA: Diagnosis not present

## 2017-02-26 DIAGNOSIS — I429 Cardiomyopathy, unspecified: Secondary | ICD-10-CM | POA: Diagnosis not present

## 2017-02-26 DIAGNOSIS — I2510 Atherosclerotic heart disease of native coronary artery without angina pectoris: Secondary | ICD-10-CM | POA: Diagnosis not present

## 2017-02-26 DIAGNOSIS — I110 Hypertensive heart disease with heart failure: Secondary | ICD-10-CM | POA: Diagnosis not present

## 2017-02-27 ENCOUNTER — Telehealth: Payer: Self-pay | Admitting: Interventional Cardiology

## 2017-02-27 NOTE — Telephone Encounter (Signed)
New Message   Patient is calling to speak with the nurse. The only information he had was that he was placed on prescription that starts with a "S". That is 25mg . He did not have the name of the medication available. He indicates the nurse would know the name of the medicine. But would not provide any additional information.other than that he is wondering can he take it. Please call.

## 2017-02-27 NOTE — Telephone Encounter (Signed)
Patient asking if he should be taking a medicine that starts with "S" that he thinks is 25 mg. Patient is unsure of the name and is unable to spell the medication. Asked if the patient was referring to spironolactone and he states that he does not know. Patient is due for his f/u appointment. Offered multiple appointment times this week and the week after so that we can review all of his medicines. Patient states that he will have to call me back to schedule his appointment.

## 2017-03-01 DIAGNOSIS — I429 Cardiomyopathy, unspecified: Secondary | ICD-10-CM | POA: Diagnosis not present

## 2017-03-01 DIAGNOSIS — I2510 Atherosclerotic heart disease of native coronary artery without angina pectoris: Secondary | ICD-10-CM | POA: Diagnosis not present

## 2017-03-01 DIAGNOSIS — Z4789 Encounter for other orthopedic aftercare: Secondary | ICD-10-CM | POA: Diagnosis not present

## 2017-03-01 DIAGNOSIS — I509 Heart failure, unspecified: Secondary | ICD-10-CM | POA: Diagnosis not present

## 2017-03-01 DIAGNOSIS — I110 Hypertensive heart disease with heart failure: Secondary | ICD-10-CM | POA: Diagnosis not present

## 2017-03-01 DIAGNOSIS — D649 Anemia, unspecified: Secondary | ICD-10-CM | POA: Diagnosis not present

## 2017-03-05 DIAGNOSIS — Z4789 Encounter for other orthopedic aftercare: Secondary | ICD-10-CM | POA: Diagnosis not present

## 2017-03-05 DIAGNOSIS — I110 Hypertensive heart disease with heart failure: Secondary | ICD-10-CM | POA: Diagnosis not present

## 2017-03-05 DIAGNOSIS — I429 Cardiomyopathy, unspecified: Secondary | ICD-10-CM | POA: Diagnosis not present

## 2017-03-05 DIAGNOSIS — I509 Heart failure, unspecified: Secondary | ICD-10-CM | POA: Diagnosis not present

## 2017-03-05 DIAGNOSIS — D649 Anemia, unspecified: Secondary | ICD-10-CM | POA: Diagnosis not present

## 2017-03-05 DIAGNOSIS — I2510 Atherosclerotic heart disease of native coronary artery without angina pectoris: Secondary | ICD-10-CM | POA: Diagnosis not present

## 2017-03-09 DIAGNOSIS — I110 Hypertensive heart disease with heart failure: Secondary | ICD-10-CM | POA: Diagnosis not present

## 2017-03-09 DIAGNOSIS — D649 Anemia, unspecified: Secondary | ICD-10-CM | POA: Diagnosis not present

## 2017-03-09 DIAGNOSIS — I509 Heart failure, unspecified: Secondary | ICD-10-CM | POA: Diagnosis not present

## 2017-03-09 DIAGNOSIS — I2510 Atherosclerotic heart disease of native coronary artery without angina pectoris: Secondary | ICD-10-CM | POA: Diagnosis not present

## 2017-03-09 DIAGNOSIS — I429 Cardiomyopathy, unspecified: Secondary | ICD-10-CM | POA: Diagnosis not present

## 2017-03-09 DIAGNOSIS — Z4789 Encounter for other orthopedic aftercare: Secondary | ICD-10-CM | POA: Diagnosis not present

## 2017-03-12 DIAGNOSIS — I429 Cardiomyopathy, unspecified: Secondary | ICD-10-CM | POA: Diagnosis not present

## 2017-03-12 DIAGNOSIS — Z4789 Encounter for other orthopedic aftercare: Secondary | ICD-10-CM | POA: Diagnosis not present

## 2017-03-12 DIAGNOSIS — I110 Hypertensive heart disease with heart failure: Secondary | ICD-10-CM | POA: Diagnosis not present

## 2017-03-12 DIAGNOSIS — I2510 Atherosclerotic heart disease of native coronary artery without angina pectoris: Secondary | ICD-10-CM | POA: Diagnosis not present

## 2017-03-12 DIAGNOSIS — D649 Anemia, unspecified: Secondary | ICD-10-CM | POA: Diagnosis not present

## 2017-03-12 DIAGNOSIS — I509 Heart failure, unspecified: Secondary | ICD-10-CM | POA: Diagnosis not present

## 2017-03-15 ENCOUNTER — Telehealth: Payer: Self-pay | Admitting: Interventional Cardiology

## 2017-03-15 DIAGNOSIS — I2510 Atherosclerotic heart disease of native coronary artery without angina pectoris: Secondary | ICD-10-CM | POA: Diagnosis not present

## 2017-03-15 DIAGNOSIS — Z4789 Encounter for other orthopedic aftercare: Secondary | ICD-10-CM | POA: Diagnosis not present

## 2017-03-15 DIAGNOSIS — I110 Hypertensive heart disease with heart failure: Secondary | ICD-10-CM | POA: Diagnosis not present

## 2017-03-15 DIAGNOSIS — I429 Cardiomyopathy, unspecified: Secondary | ICD-10-CM | POA: Diagnosis not present

## 2017-03-15 DIAGNOSIS — I509 Heart failure, unspecified: Secondary | ICD-10-CM | POA: Diagnosis not present

## 2017-03-15 DIAGNOSIS — D649 Anemia, unspecified: Secondary | ICD-10-CM | POA: Diagnosis not present

## 2017-03-15 NOTE — Telephone Encounter (Signed)
Returned call to [facility] regarding the patient's ASA dose. Made her awrae that from a cardiac standpoint we only need him on 81 mg QD. Made her aware that he was in the hospital for left knee septic arthritis and when he was d/c'd  Dr. Irene Limbo made that increase in his ASA.

## 2017-03-15 NOTE — Telephone Encounter (Signed)
Called and spoke to patient and scheduled him his overdue 6 month f/u appointment with Dr. Eldridge Dace on 03/29/17 at 11:40 AM.

## 2017-03-15 NOTE — Telephone Encounter (Signed)
New message     Call from [facility] [phone], calling to confirm aspirin.  Pt c/o medication issue:  1. Name of Medication: aspirin   2. How are you currently taking this medication (dosage and times per day)? n/a  3. Are you having a reaction (difficulty breathing--STAT)? NO  4. What is your medication issue? Should patient be on 325 or 81

## 2017-03-19 ENCOUNTER — Telehealth: Payer: Self-pay | Admitting: Interventional Cardiology

## 2017-03-19 NOTE — Telephone Encounter (Signed)
Patient's daughter calling (DPR on file) and asking if the patient should be taking the furosemide or spironolactone. Made daughter aware that the patient should be taking furosemide, that the spironolactone was d/c'd back in October d/t hyperkalemia. Made her aware that patient has OV with Dr. Eldridge Dace on 2/14 at 11:40 Am. Daughter verbalized understanding.

## 2017-03-19 NOTE — Telephone Encounter (Signed)
Patient daughter [name]) calling,   States that she has some questions about his medications and would like to discuss

## 2017-03-21 ENCOUNTER — Encounter: Payer: Self-pay | Admitting: Interventional Cardiology

## 2017-03-21 DIAGNOSIS — D649 Anemia, unspecified: Secondary | ICD-10-CM | POA: Diagnosis not present

## 2017-03-21 DIAGNOSIS — I110 Hypertensive heart disease with heart failure: Secondary | ICD-10-CM | POA: Diagnosis not present

## 2017-03-21 DIAGNOSIS — I2510 Atherosclerotic heart disease of native coronary artery without angina pectoris: Secondary | ICD-10-CM | POA: Diagnosis not present

## 2017-03-21 DIAGNOSIS — I509 Heart failure, unspecified: Secondary | ICD-10-CM | POA: Diagnosis not present

## 2017-03-21 DIAGNOSIS — Z4789 Encounter for other orthopedic aftercare: Secondary | ICD-10-CM | POA: Diagnosis not present

## 2017-03-21 DIAGNOSIS — I429 Cardiomyopathy, unspecified: Secondary | ICD-10-CM | POA: Diagnosis not present

## 2017-03-28 DIAGNOSIS — I429 Cardiomyopathy, unspecified: Secondary | ICD-10-CM | POA: Diagnosis not present

## 2017-03-28 DIAGNOSIS — D649 Anemia, unspecified: Secondary | ICD-10-CM | POA: Diagnosis not present

## 2017-03-28 DIAGNOSIS — I2510 Atherosclerotic heart disease of native coronary artery without angina pectoris: Secondary | ICD-10-CM | POA: Diagnosis not present

## 2017-03-28 DIAGNOSIS — I110 Hypertensive heart disease with heart failure: Secondary | ICD-10-CM | POA: Diagnosis not present

## 2017-03-28 DIAGNOSIS — Z4789 Encounter for other orthopedic aftercare: Secondary | ICD-10-CM | POA: Diagnosis not present

## 2017-03-28 DIAGNOSIS — I509 Heart failure, unspecified: Secondary | ICD-10-CM | POA: Diagnosis not present

## 2017-03-28 NOTE — Progress Notes (Signed)
Cardiology Office Note   Date:  03/29/2017   ID:  Matthew [name], DOB 25-Nov-1940, MRN [id number]  PCP:  [name], MD    No chief complaint on file.  HTN, NICM  Wt Readings from Last 3 Encounters:  03/29/17 268 lb (121.6 kg)  01/11/17 288 lb (130.6 kg)  11/21/16 288 lb (130.6 kg)       History of Present Illness: Matthew [name] is a 77 y.o. male  who has had issues with HTN and obesity. He lost some weight years ago. He has been as high as 345 lbs.  Prior cardiomyopathy: LVEF 50-55% in 2014.  Mildly dilated aortic root at that time.   He had staph arthritis, MSSA.  Followed by ID. TOlerated antibiotics longterm.  He required surgery and tolerated this well.   Knee is much better.  He is walking some for exercise.   Denies : Chest pain. Dizziness. Leg edema. Nitroglycerin use. Orthopnea. Palpitations. Paroxysmal nocturnal dyspnea. Shortness of breath. Syncope.      Past Medical History:  Diagnosis Date  . CAD (coronary artery disease)   . Cardiomyopathy ([place])   . Heart failure ([place])   . HTN (hypertension)   . Obesity   . Osteoarthritis     Past Surgical History:  Procedure Laterality Date  . CHONDROPLASTY Left 01/11/2017   Procedure: CHONDROPLASTY of medial and patella compartment;  Surgeon: Dorna Leitz, MD;  Location: [place];  Service: Orthopedics;  Laterality: Left;  . KNEE ARTHROSCOPY Left 01/11/2017   Procedure: ARTHROSCOPIC IRRIGATION AND DEBRIDMENT KNEE;  Surgeon: Dorna Leitz, MD;  Location: [place];  Service: Orthopedics;  Laterality: Left;  . KNEE SURGERY    . MENISECTOMY Left 01/11/2017   Procedure: Lateral MENISECTOMY;  Surgeon: Dorna Leitz, MD;  Location: [place];  Service: Orthopedics;  Laterality: Left;  . SYNOVECTOMY Left 01/11/2017   Procedure: Four compartment SYNOVECTOMY;  Surgeon: Dorna Leitz, MD;  Location: [place];  Service: Orthopedics;  Laterality: Left;     Current Outpatient Medications  Medication Sig Dispense Refill  .  amLODipine (NORVASC) 10 MG tablet take 1 tablet by mouth once daily (Patient taking differently: Take 10 mg by mouth once a day) 90 tablet 1  . aspirin 325 MG tablet Take 1 tablet (325 mg total) by mouth daily.    Marland Kitchen atorvastatin (LIPITOR) 10 MG tablet take 1 tablet by mouth once daily (Patient taking differently: Take 10 mg by mouth once a day) 90 tablet 1  . carvedilol (COREG) 25 MG tablet take 1 tablet by mouth twice a day with meals (Patient taking differently: Take 25 mg by mouth two times a day) 60 tablet 8  . furosemide (LASIX) 40 MG tablet Take 40 mg by mouth daily.  1  . HYDROcodone-acetaminophen (NORCO/VICODIN) 5-325 MG tablet Take 1-2 tablets by mouth every 6 (six) hours as needed for moderate pain. 30 tablet 0  . ramipril (ALTACE) 10 MG capsule take 1 capsule by mouth twice a day (Patient taking differently: Take 10 mg by mouth two times a day) 180 capsule 1  . sildenafil (VIAGRA) 100 MG tablet Take 1 tablet (100 mg total) by mouth daily as needed for erectile dysfunction. 10 tablet 6  . vancomycin IVPB Inject 1,000 mg into the vein every 12 (twelve) hours. Indication:  Septic arthritis/ synovial infection Last Day of Therapy:  02/07/17 Labs - Sunday/Monday:  CBC/D, BMP, and vancomycin trough. Labs - Thursday:  BMP and vancomycin trough Labs - Every other week:  ESR and CRP 48 Units 0   No current facility-administered medications for this visit.     Allergies:   Patient has no known allergies.    Social History:  The patient  reports that  has never smoked. he has never used smokeless tobacco. He reports that he does not drink alcohol or use drugs.   Family History:  The patient's family history includes Diabetes in his brother; Hypertension in his father.    ROS:  Please see the history of present illness.   Otherwise, review of systems are positive for intentional weight loss.   All other systems are reviewed and negative.    PHYSICAL EXAM: VS:  BP 120/70   Pulse 86   Ht  '6\' 4"'  (1.93 m)   Wt 268 lb (121.6 kg)   SpO2 99%   BMI 32.62 kg/m  , BMI Body mass index is 32.62 kg/m. GEN: Well nourished, well developed, in no acute distress  HEENT: normal  Neck: no JVD, carotid bruits, or masses Cardiac: RRR, premature beats; no murmurs, rubs, or gallops,no edema  Respiratory:  clear to auscultation bilaterally, normal work of breathing GI: soft, nontender, nondistended, + BS MS: no deformity or atrophy  Skin: warm and dry, no rash Neuro:  Strength and sensation are intact Psych: euthymic mood, full affect   EKG:   The ekg ordered in 11/18 demonstrates bigeminy   Recent Labs: 01/13/2017: ALT 17; Hemoglobin 11.2; Magnesium 1.9; Platelets 203 01/16/2017: BUN 11; Creatinine, Ser 0.95; Potassium 4.0; Sodium 132   Lipid Panel    Component Value Date/Time   CHOL 100 08/21/2016 0855   TRIG 71 08/21/2016 0855   HDL 33 (L) 08/21/2016 0855   CHOLHDL 3.0 08/21/2016 0855   CHOLHDL 3.2 06/11/2015 0852   VLDL 14 06/11/2015 0852   LDLCALC 53 08/21/2016 0855     Other studies Reviewed: Additional studies/ records that were reviewed today with results demonstrating: Hospital records reviewed.   ASSESSMENT AND PLAN:  1. Prior cardiomyopathy: EF 50-55% in 2014.  Appears euvolemic.  Decrease aspirin to 81 mg daily.  2. Obesity: Contiue efforts to try to lose weight.  Keep weight off that he lost.  3. HTN: Well controlled. Check electrolytes. 4. CAD: Branch vessel disease noted by cath in 2001.  No angina. 5. Erectile dysfunction: has not been an issue while he has been ill. 6. Hyperlipidemia: COntinue atorvastatin. LDL 53 in 7/18.   Current medicines are reviewed at length with the patient today.  The patient concerns regarding his medicines were addressed.  The following changes have been made:  No change  Labs/ tests ordered today include:  No orders of the defined types were placed in this encounter.   Recommend 150 minutes/week of aerobic exercise Low  fat, low carb, high fiber diet recommended  Disposition:   FU in 6 months   Signed, Larae Grooms, MD  03/29/2017 8:54 AM    [facility] [place], [place], [place]  [address] Phone: [phone]; Fax: [phone]

## 2017-03-29 ENCOUNTER — Encounter: Payer: Self-pay | Admitting: Interventional Cardiology

## 2017-03-29 ENCOUNTER — Ambulatory Visit (INDEPENDENT_AMBULATORY_CARE_PROVIDER_SITE_OTHER): Payer: Medicare Other | Admitting: Interventional Cardiology

## 2017-03-29 VITALS — BP 120/70 | HR 86 | Ht 76.0 in | Wt 268.0 lb

## 2017-03-29 DIAGNOSIS — I420 Dilated cardiomyopathy: Secondary | ICD-10-CM

## 2017-03-29 DIAGNOSIS — I10 Essential (primary) hypertension: Secondary | ICD-10-CM

## 2017-03-29 DIAGNOSIS — E782 Mixed hyperlipidemia: Secondary | ICD-10-CM

## 2017-03-29 DIAGNOSIS — I2510 Atherosclerotic heart disease of native coronary artery without angina pectoris: Secondary | ICD-10-CM

## 2017-03-29 LAB — BASIC METABOLIC PANEL
BUN / CREAT RATIO: 11 (ref 10–24)
BUN: 14 mg/dL (ref 8–27)
CHLORIDE: 105 mmol/L (ref 96–106)
CO2: 20 mmol/L (ref 20–29)
Calcium: 9.5 mg/dL (ref 8.6–10.2)
Creatinine, Ser: 1.25 mg/dL (ref 0.76–1.27)
GFR calc Af Amer: 64 mL/min/{1.73_m2} (ref >59)
GFR calc non Af Amer: 56 mL/min/{1.73_m2} — ABNORMAL LOW (ref >59)
GLUCOSE: 92 mg/dL (ref 65–99)
POTASSIUM: 4.4 mmol/L (ref 3.5–5.2)
SODIUM: 144 mmol/L (ref 134–144)

## 2017-03-29 MED ORDER — ASPIRIN EC 81 MG PO TBEC: 81.0000 mg | DELAYED_RELEASE_TABLET | Freq: Every day | ORAL | 3 refills | Status: DC

## 2017-03-29 NOTE — Patient Instructions (Signed)
Medication Instructions:  Your physician has recommended you make the following change in your medication:   DECREASE: Aspirin to 81 mg daily  Labwork: TODAY: BMET  Testing/Procedures: None ordered  Follow-Up: Your physician wants you to follow-up in: 6 months with Dr. Eldridge Dace. You will receive a reminder letter in the mail two months in advance. If you don't receive a letter, please call our office to schedule the follow-up appointment.   Any Other Special Instructions Will Be Listed Below (If Applicable).     If you need a refill on your cardiac medications before your next appointment, please call your pharmacy.

## 2017-03-30 ENCOUNTER — Telehealth: Payer: Self-pay

## 2017-03-30 NOTE — Telephone Encounter (Signed)
-----   Message from Corky Crafts, MD sent at 03/29/2017  5:05 PM EST ----- Electrolytes stable. Can decrease Lasix to 5 days/week. Can take additional tab if he notices increased swelling.

## 2017-03-30 NOTE — Telephone Encounter (Signed)
Patient made aware of lab result and recommendations to take lasix 5 days a week. Made patient aware that he can take an additional tablet if he notices increased swelling. Patient verbalizes understanding and thanked me for the call.

## 2017-04-05 DIAGNOSIS — I429 Cardiomyopathy, unspecified: Secondary | ICD-10-CM | POA: Diagnosis not present

## 2017-04-05 DIAGNOSIS — Z4789 Encounter for other orthopedic aftercare: Secondary | ICD-10-CM | POA: Diagnosis not present

## 2017-04-05 DIAGNOSIS — I509 Heart failure, unspecified: Secondary | ICD-10-CM | POA: Diagnosis not present

## 2017-04-05 DIAGNOSIS — D649 Anemia, unspecified: Secondary | ICD-10-CM | POA: Diagnosis not present

## 2017-04-05 DIAGNOSIS — I110 Hypertensive heart disease with heart failure: Secondary | ICD-10-CM | POA: Diagnosis not present

## 2017-04-05 DIAGNOSIS — I2510 Atherosclerotic heart disease of native coronary artery without angina pectoris: Secondary | ICD-10-CM | POA: Diagnosis not present

## 2017-04-10 DIAGNOSIS — M67912 Unspecified disorder of synovium and tendon, left shoulder: Secondary | ICD-10-CM | POA: Diagnosis not present

## 2017-04-10 DIAGNOSIS — M67911 Unspecified disorder of synovium and tendon, right shoulder: Secondary | ICD-10-CM | POA: Diagnosis not present

## 2017-04-30 ENCOUNTER — Other Ambulatory Visit: Payer: Self-pay | Admitting: Interventional Cardiology

## 2017-04-30 MED ORDER — ATORVASTATIN CALCIUM 10 MG PO TABS: 10.0000 mg | ORAL_TABLET | Freq: Every day | ORAL | 3 refills | Status: DC

## 2017-04-30 MED ORDER — RAMIPRIL 10 MG PO CAPS: 10.0000 mg | ORAL_CAPSULE | Freq: Two times a day (BID) | ORAL | 3 refills | Status: DC

## 2017-04-30 MED ORDER — AMLODIPINE BESYLATE 10 MG PO TABS: 10.0000 mg | ORAL_TABLET | Freq: Every day | ORAL | 3 refills | Status: DC

## 2017-06-06 ENCOUNTER — Telehealth: Payer: Self-pay

## 2017-06-06 MED ORDER — FUROSEMIDE 40 MG PO TABS: 40.0000 mg | ORAL_TABLET | ORAL | 3 refills | Status: DC

## 2017-06-06 NOTE — Telephone Encounter (Signed)
Called and spoke to [place], the patient's daughter (DPR on file). She states that she went to pick up lasix refill for the patient and was given spironolactone. She is wanting to know if the patient should be taking spironolactone or lasix. Made daughter aware that the spironolactone was d/cd due to hyperkalemia and the patient should only be taking lasix 40 mg 5 times a week. Made her aware that the patient can take an extra tablet if he notices increased swelling. Daughter verbalized understanding and thanked me for the call.

## 2017-08-13 ENCOUNTER — Other Ambulatory Visit: Payer: Self-pay | Admitting: Interventional Cardiology

## 2017-08-23 ENCOUNTER — Other Ambulatory Visit: Payer: Self-pay | Admitting: Interventional Cardiology

## 2017-10-03 NOTE — Progress Notes (Signed)
Cardiology Office Note   Date:  10/05/2017   ID:  [name], DOB 11-25-1940, MRN [id number]  PCP:  [name], MD    No chief complaint on file.  NICM  Wt Readings from Last 3 Encounters:  10/05/17 254 lb 9.6 oz (115.5 kg)  03/29/17 268 lb (121.6 kg)  01/11/17 288 lb (130.6 kg)       History of Present Illness: Matthew [name] is a 77 y.o. male  who has had issues with HTN and obesity. He lost some weight years ago. He has been as high as 345 lbs.  Prior cardiomyopathy:LVEF 50-55% in 2014.  Mildly dilated aortic root at that time.  He had staph arthritis, MSSA in 12/18.  Followed by ID. Tolerated antibiotics longterm.  He required surgery in 12/18 and tolerated this well.  Knee is much better.    He has continued to lose weight since that time.  He is eating healthy.  He walks regularly.  He is trying to stay near 250 lbs.   Denies : Chest pain. Dizziness. Leg edema. Nitroglycerin use. Orthopnea. Palpitations. Paroxysmal nocturnal dyspnea. Shortness of breath. Syncope.     Past Medical History:  Diagnosis Date  . CAD (coronary artery disease)   . Cardiomyopathy ([place])   . Heart failure ([place])   . HTN (hypertension)   . Obesity   . Osteoarthritis     Past Surgical History:  Procedure Laterality Date  . CHONDROPLASTY Left 01/11/2017   Procedure: CHONDROPLASTY of medial and patella compartment;  Surgeon: Dorna Leitz, MD;  Location: [place];  Service: Orthopedics;  Laterality: Left;  . KNEE ARTHROSCOPY Left 01/11/2017   Procedure: ARTHROSCOPIC IRRIGATION AND DEBRIDMENT KNEE;  Surgeon: Dorna Leitz, MD;  Location: [place];  Service: Orthopedics;  Laterality: Left;  . KNEE SURGERY    . MENISECTOMY Left 01/11/2017   Procedure: Lateral MENISECTOMY;  Surgeon: Dorna Leitz, MD;  Location: [place];  Service: Orthopedics;  Laterality: Left;  . SYNOVECTOMY Left 01/11/2017   Procedure: Four compartment SYNOVECTOMY;  Surgeon: Dorna Leitz, MD;  Location: [place];   Service: Orthopedics;  Laterality: Left;     Current Outpatient Medications  Medication Sig Dispense Refill  . amLODipine (NORVASC) 10 MG tablet Take 1 tablet (10 mg total) by mouth daily. 90 tablet 3  . aspirin EC 81 MG tablet Take 1 tablet (81 mg total) by mouth daily. 90 tablet 3  . atorvastatin (LIPITOR) 10 MG tablet Take 1 tablet (10 mg total) by mouth daily. 90 tablet 3  . carvedilol (COREG) 25 MG tablet TAKE 1 TABLET BY MOUTH TWICE A DAY WITH MEALS 60 tablet 1  . furosemide (LASIX) 40 MG tablet Take 1 tablet (40 mg total) by mouth See admin instructions. 5 DAYS A WEEK 90 tablet 3  . ramipril (ALTACE) 10 MG capsule Take 1 capsule (10 mg total) by mouth 2 (two) times daily. 180 capsule 3  . vancomycin IVPB Inject 1,000 mg into the vein every 12 (twelve) hours. Indication:  Septic arthritis/ synovial infection Last Day of Therapy:  02/07/17 Labs - Sunday/Monday:  CBC/D, BMP, and vancomycin trough. Labs - Thursday:  BMP and vancomycin trough Labs - Every other week:  ESR and CRP 48 Units 0   No current facility-administered medications for this visit.     Allergies:   Patient has no known allergies.    Social History:  The patient  reports that he has never smoked. He has never used smokeless tobacco. He  reports that he does not drink alcohol or use drugs.   Family History:  The patient's family history includes Diabetes in his brother; Hypertension in his father.    ROS:  Please see the history of present illness.   Otherwise, review of systems are positive for intentional weight loss.   All other systems are reviewed and negative.    PHYSICAL EXAM: VS:  BP 110/72   Pulse (!) 49   Ht '6\' 4"'  (1.93 m)   Wt 254 lb 9.6 oz (115.5 kg)   SpO2 98%   BMI 30.99 kg/m  , BMI Body mass index is 30.99 kg/m. GEN: Well nourished, well developed, in no acute distress  HEENT: normal  Neck: no JVD, carotid bruits, or masses Cardiac: RRR, premature beats; no murmurs, rubs, or gallops,no  edema  Respiratory:  clear to auscultation bilaterally, normal work of breathing GI: soft, nontender, nondistended, + BS MS: no deformity or atrophy  Skin: warm and dry, no rash Neuro:  Strength and sensation are intact Psych: euthymic mood, full affect   EKG:   The ekg ordered today demonstrates NSR, RBBB, PVCs   Recent Labs: 01/13/2017: ALT 17; Hemoglobin 11.2; Magnesium 1.9; Platelets 203 03/29/2017: BUN 14; Creatinine, Ser 1.25; Potassium 4.4; Sodium 144   Lipid Panel    Component Value Date/Time   CHOL 100 08/21/2016 0855   TRIG 71 08/21/2016 0855   HDL 33 (L) 08/21/2016 0855   CHOLHDL 3.0 08/21/2016 0855   CHOLHDL 3.2 06/11/2015 0852   VLDL 14 06/11/2015 0852   LDLCALC 53 08/21/2016 0855     Other studies Reviewed: Additional studies/ records that were reviewed today with results demonstrating: hospital records reviewed.   ASSESSMENT AND PLAN:  1. Prior NICM: Improved EF on most recent echo.  He appears euvolemic.  No signs of congestive heart failure. 2. Obesity: He has lost a lot of weight.  Continue trying to eat healthy and get regular exercise.  He is doing very well. 3. HTN: Well-controlled.  Continue current medications. 4. CAD: Branch vessel disease in 2001.  No angina on medical therapy. 5. Hyperlipidemia: LDL 53.  Continue atorvastatin.   Current medicines are reviewed at length with the patient today.  The patient concerns regarding his medicines were addressed.  The following changes have been made:  No change  Labs/ tests ordered today include: Lipids, C met No orders of the defined types were placed in this encounter.   Recommend 150 minutes/week of aerobic exercise Low fat, low carb, high fiber diet recommended  Disposition:   FU in 6 months   Signed, Larae Grooms, MD  10/05/2017 8:10 AM    [facility] [place], [place], [place]  [address] Phone: [phone]; Fax: [phone]

## 2017-10-05 ENCOUNTER — Ambulatory Visit (INDEPENDENT_AMBULATORY_CARE_PROVIDER_SITE_OTHER): Payer: Medicare Other | Admitting: Interventional Cardiology

## 2017-10-05 ENCOUNTER — Encounter: Payer: Self-pay | Admitting: Interventional Cardiology

## 2017-10-05 VITALS — BP 110/72 | HR 49 | Ht 76.0 in | Wt 254.6 lb

## 2017-10-05 DIAGNOSIS — Z6833 Body mass index (BMI) 33.0-33.9, adult: Secondary | ICD-10-CM

## 2017-10-05 DIAGNOSIS — E669 Obesity, unspecified: Secondary | ICD-10-CM | POA: Diagnosis not present

## 2017-10-05 DIAGNOSIS — I2510 Atherosclerotic heart disease of native coronary artery without angina pectoris: Secondary | ICD-10-CM

## 2017-10-05 DIAGNOSIS — I25118 Atherosclerotic heart disease of native coronary artery with other forms of angina pectoris: Secondary | ICD-10-CM | POA: Diagnosis not present

## 2017-10-05 DIAGNOSIS — I420 Dilated cardiomyopathy: Secondary | ICD-10-CM | POA: Diagnosis not present

## 2017-10-05 DIAGNOSIS — I10 Essential (primary) hypertension: Secondary | ICD-10-CM | POA: Diagnosis not present

## 2017-10-05 DIAGNOSIS — E782 Mixed hyperlipidemia: Secondary | ICD-10-CM

## 2017-10-05 DIAGNOSIS — E66811 Obesity, class 1: Secondary | ICD-10-CM

## 2017-10-05 LAB — COMPREHENSIVE METABOLIC PANEL
ALBUMIN: 4.2 g/dL (ref 3.5–4.8)
ALT: 10 IU/L (ref 0–44)
AST: 17 IU/L (ref 0–40)
Albumin/Globulin Ratio: 1.2 (ref 1.2–2.2)
Alkaline Phosphatase: 116 IU/L (ref 39–117)
BUN / CREAT RATIO: 15 (ref 10–24)
BUN: 20 mg/dL (ref 8–27)
Bilirubin Total: 0.6 mg/dL (ref 0.0–1.2)
CALCIUM: 9.7 mg/dL (ref 8.6–10.2)
CO2: 21 mmol/L (ref 20–29)
CREATININE: 1.35 mg/dL — AB (ref 0.76–1.27)
Chloride: 101 mmol/L (ref 96–106)
GFR calc Af Amer: 58 mL/min/{1.73_m2} — ABNORMAL LOW (ref >59)
GFR, EST NON AFRICAN AMERICAN: 50 mL/min/{1.73_m2} — AB (ref >59)
GLOBULIN, TOTAL: 3.4 g/dL (ref 1.5–4.5)
GLUCOSE: 87 mg/dL (ref 65–99)
Potassium: 4.5 mmol/L (ref 3.5–5.2)
SODIUM: 139 mmol/L (ref 134–144)
Total Protein: 7.6 g/dL (ref 6.0–8.5)

## 2017-10-05 LAB — LIPID PANEL
CHOL/HDL RATIO: 2.7 ratio (ref 0.0–5.0)
Cholesterol, Total: 111 mg/dL (ref 100–199)
HDL: 41 mg/dL (ref >39)
LDL CALC: 58 mg/dL (ref 0–99)
TRIGLYCERIDES: 59 mg/dL (ref 0–149)
VLDL CHOLESTEROL CAL: 12 mg/dL (ref 5–40)

## 2017-10-05 NOTE — Patient Instructions (Signed)
Medication Instructions:  Your physician recommends that you continue on your current medications as directed. Please refer to the Current Medication list given to you today.   Labwork: TODAY: CMET, LIPIDS  Testing/Procedures: None ordered  Follow-Up: Your physician wants you to follow-up in: 6 months with Dr. Varanasi. You will receive a reminder letter in the mail two months in advance. If you don't receive a letter, please call our office to schedule the follow-up appointment.   Any Other Special Instructions Will Be Listed Below (If Applicable).     If you need a refill on your cardiac medications before your next appointment, please call your pharmacy.   

## 2017-10-09 ENCOUNTER — Telehealth: Payer: Self-pay | Admitting: Cardiovascular Disease

## 2017-10-09 NOTE — Telephone Encounter (Signed)
New Message      Patient is call for lab results, pls advise.

## 2017-10-09 NOTE — Telephone Encounter (Signed)
Reviewed results/recommmendations with patient and he verbalized understanding.

## 2017-10-18 ENCOUNTER — Other Ambulatory Visit: Payer: Self-pay | Admitting: Interventional Cardiology

## 2018-02-26 ENCOUNTER — Telehealth: Payer: Self-pay

## 2018-02-26 MED ORDER — SILDENAFIL CITRATE 100 MG PO TABS: 100.0000 mg | ORAL_TABLET | Freq: Every day | ORAL | 3 refills | Status: DC | PRN

## 2018-02-26 NOTE — Telephone Encounter (Addendum)
Spoke with patient and made him aware that we received a fax stating that his insurance did not cover his viagra. Offered to send to [facility] for a cheaper out of pocket cost and offered to switch to a different medicine (ie. Cialis) which is covered by his insurance. Patient states that he just got off of the phone with [facility] and states that he is aware. He states that he pays for his viagra himself and did not want to switch to a different medicine or have it called in to another pharmacy. Instructed for the patient to let us know if he wants [place] to make any changes. Patient verbalized understanding and thanked me for the call.

## 2018-02-26 NOTE — Telephone Encounter (Signed)
Note from pharmacy: Medication is not covered by pt's insurance.

## 2018-02-26 NOTE — Telephone Encounter (Addendum)
Pt called wanting a refill on his Viagra. Per Daleen Bo, RN and Dr. Eldridge Dace it is okay to refill medication.

## 2018-04-01 ENCOUNTER — Other Ambulatory Visit: Payer: Self-pay | Admitting: Interventional Cardiology

## 2018-04-02 DIAGNOSIS — M67911 Unspecified disorder of synovium and tendon, right shoulder: Secondary | ICD-10-CM | POA: Diagnosis not present

## 2018-04-02 DIAGNOSIS — M67912 Unspecified disorder of synovium and tendon, left shoulder: Secondary | ICD-10-CM | POA: Diagnosis not present

## 2018-04-02 DIAGNOSIS — M19012 Primary osteoarthritis, left shoulder: Secondary | ICD-10-CM | POA: Diagnosis not present

## 2018-04-02 DIAGNOSIS — M19011 Primary osteoarthritis, right shoulder: Secondary | ICD-10-CM | POA: Diagnosis not present

## 2018-04-15 ENCOUNTER — Encounter: Payer: Self-pay | Admitting: Interventional Cardiology

## 2018-04-15 ENCOUNTER — Ambulatory Visit (INDEPENDENT_AMBULATORY_CARE_PROVIDER_SITE_OTHER): Payer: Medicare Other | Admitting: Interventional Cardiology

## 2018-04-15 VITALS — BP 118/72 | HR 89 | Ht 76.0 in | Wt 267.4 lb

## 2018-04-15 DIAGNOSIS — I10 Essential (primary) hypertension: Secondary | ICD-10-CM | POA: Diagnosis not present

## 2018-04-15 DIAGNOSIS — I2510 Atherosclerotic heart disease of native coronary artery without angina pectoris: Secondary | ICD-10-CM

## 2018-04-15 DIAGNOSIS — E782 Mixed hyperlipidemia: Secondary | ICD-10-CM

## 2018-04-15 DIAGNOSIS — I420 Dilated cardiomyopathy: Secondary | ICD-10-CM

## 2018-04-15 LAB — BASIC METABOLIC PANEL
BUN/Creatinine Ratio: 16 (ref 10–24)
BUN: 17 mg/dL (ref 8–27)
CO2: 21 mmol/L (ref 20–29)
Calcium: 9.2 mg/dL (ref 8.6–10.2)
Chloride: 104 mmol/L (ref 96–106)
Creatinine, Ser: 1.09 mg/dL (ref 0.76–1.27)
GFR calc Af Amer: 75 mL/min/{1.73_m2} (ref >59)
GFR calc non Af Amer: 65 mL/min/{1.73_m2} (ref >59)
Glucose: 78 mg/dL (ref 65–99)
Potassium: 4.6 mmol/L (ref 3.5–5.2)
Sodium: 140 mmol/L (ref 134–144)

## 2018-04-15 MED ORDER — FUROSEMIDE 40 MG PO TABS: 40.0000 mg | ORAL_TABLET | ORAL | 3 refills | Status: DC

## 2018-04-15 MED ORDER — ATORVASTATIN CALCIUM 10 MG PO TABS: ORAL_TABLET | ORAL | 3 refills | Status: DC

## 2018-04-15 MED ORDER — CARVEDILOL 25 MG PO TABS: 25.0000 mg | ORAL_TABLET | Freq: Two times a day (BID) | ORAL | 3 refills | Status: DC

## 2018-04-15 MED ORDER — RAMIPRIL 10 MG PO CAPS: ORAL_CAPSULE | ORAL | 3 refills | Status: DC

## 2018-04-15 MED ORDER — AMLODIPINE BESYLATE 10 MG PO TABS
ORAL_TABLET | ORAL | 3 refills | Status: DC
Start: 2018-04-15 — End: 2019-04-30

## 2018-04-15 NOTE — Progress Notes (Signed)
Cardiology Office Note   Date:  04/15/2018   ID:  SHAWNA KIENER, DOB 22-May-1940, MRN [id number]  PCP:  [name], MD    No chief complaint on file.    Wt Readings from Last 3 Encounters:  04/15/18 267 lb 6.4 oz (121.3 kg)  10/05/17 254 lb 9.6 oz (115.5 kg)  03/29/17 268 lb (121.6 kg)       History of Present Illness: Matthew [name] is a 78 y.o. male   who has had issues with HTN and obesity. He lost some weight years ago. He has been as high as 345 lbs. Prior cardiomyopathy:LVEF 50-55% in 2014. Mildly dilated aortic root at that time.  He had staph arthritis, MSSA in 12/18. Followed by ID. Tolerated antibiotics longterm. He required surgery in 12/18 and tolerated this well. Knee is much better.    He has gained weight since 2018 hospital stay.  He is eating more.    Denies : Chest pain. Dizziness. Leg edema. Nitroglycerin use. Orthopnea. Palpitations. Paroxysmal nocturnal dyspnea. Shortness of breath. Syncope.   BP at home is stable.  Readings are in the 120/70 range. He avoids salt.     Past Medical History:  Diagnosis Date  . CAD (coronary artery disease)   . Cardiomyopathy ([place])   . Heart failure ([place])   . HTN (hypertension)   . Obesity   . Osteoarthritis     Past Surgical History:  Procedure Laterality Date  . CHONDROPLASTY Left 01/11/2017   Procedure: CHONDROPLASTY of medial and patella compartment;  Surgeon: Dorna Leitz, MD;  Location: [place];  Service: Orthopedics;  Laterality: Left;  . KNEE ARTHROSCOPY Left 01/11/2017   Procedure: ARTHROSCOPIC IRRIGATION AND DEBRIDMENT KNEE;  Surgeon: Dorna Leitz, MD;  Location: [place];  Service: Orthopedics;  Laterality: Left;  . KNEE SURGERY    . MENISECTOMY Left 01/11/2017   Procedure: Lateral MENISECTOMY;  Surgeon: Dorna Leitz, MD;  Location: [place];  Service: Orthopedics;  Laterality: Left;  . SYNOVECTOMY Left 01/11/2017   Procedure: Four compartment SYNOVECTOMY;  Surgeon: Dorna Leitz, MD;   Location: [place];  Service: Orthopedics;  Laterality: Left;     Current Outpatient Medications  Medication Sig Dispense Refill  . amLODipine (NORVASC) 10 MG tablet TAKE 1 TABLET(10 MG) BY MOUTH DAILY 90 tablet 1  . aspirin EC 81 MG tablet Take 1 tablet (81 mg total) by mouth daily. 90 tablet 3  . atorvastatin (LIPITOR) 10 MG tablet TAKE 1 TABLET(10 MG) BY MOUTH DAILY 90 tablet 1  . carvedilol (COREG) 25 MG tablet TAKE 1 TABLET BY MOUTH TWICE A DAY WITH MEALS 60 tablet 11  . furosemide (LASIX) 40 MG tablet Take 1 tablet (40 mg total) by mouth See admin instructions. 5 DAYS A WEEK 90 tablet 3  . ramipril (ALTACE) 10 MG capsule TAKE 1 CAPSULE(10 MG) BY MOUTH TWICE DAILY 180 capsule 1  . sildenafil (VIAGRA) 100 MG tablet Take 1 tablet (100 mg total) by mouth daily as needed for erectile dysfunction. 10 tablet 3  . vancomycin IVPB Inject 1,000 mg into the vein every 12 (twelve) hours. Indication:  Septic arthritis/ synovial infection Last Day of Therapy:  02/07/17 Labs - Sunday/Monday:  CBC/D, BMP, and vancomycin trough. Labs - Thursday:  BMP and vancomycin trough Labs - Every other week:  ESR and CRP 48 Units 0   No current facility-administered medications for this visit.     Allergies:   Patient has no known allergies.    Social  History:  The patient  reports that he has never smoked. He has never used smokeless tobacco. He reports that he does not drink alcohol or use drugs.   Family History:  The patient's family history includes Diabetes in his brother; Hypertension in his father.    ROS:  Please see the history of present illness.   Otherwise, review of systems are positive for weight gain.   All other systems are reviewed and negative.    PHYSICAL EXAM: VS:  BP 118/72   Pulse 89   Ht _0  (1.93 m)   Wt 267 lb 6.4 oz (121.3 kg)   SpO2 92%   BMI 32.55 kg/m  , BMI Body mass index is 32.55 kg/m. GEN: Well nourished, well developed, in no acute distress  HEENT: normal  Neck:  no JVD, carotid bruits, or masses Cardiac: RRR- with frequent premature beats; no murmurs, rubs, or gallops,no edema  Respiratory:  clear to auscultation bilaterally, normal work of breathing GI: soft, nontender, nondistended, + BS, obese MS: no deformity or atrophy , kyphosis Skin: warm and dry, no rash Neuro:  Strength and sensation are intact; walks with a rolling [place] Psych: euthymic mood, full affect   EKG:   The ekg ordered today demonstrates sinus rhythm with frequent PVCs   Recent Labs: 10/05/2017: ALT 10; BUN 20; Creatinine, Ser 1.35; Potassium 4.5; Sodium 139   Lipid Panel    Component Value Date/Time   CHOL 111 10/05/2017 0828   TRIG 59 10/05/2017 0828   HDL 41 10/05/2017 0828   CHOLHDL 2.7 10/05/2017 0828   CHOLHDL 3.2 06/11/2015 0852   VLDL 14 06/11/2015 0852   LDLCALC 58 10/05/2017 0828     Other studies Reviewed: Additional studies/ records that were reviewed today with results demonstrating: prior echo reviewed.   ASSESSMENT AND PLAN:  1. Prior NICM: EF low normal in 2014.  COntinue meds for cardiomyopathy.  Need to watch PVC count going forward. 2. Obesity: Target weight 250 lbs.   3. HTN: The current medical regimen is effective;  continue present plan and medications.  Check electrolytes today. 4. CAD: Branch vessel disease in 2001.  COntinue medical therapy which has controlled.  5. Hyperlipidemia: LDL 58 in 8/19   Current medicines are reviewed at length with the patient today.  The patient concerns regarding his medicines were addressed.  The following changes have been made:  No change  Labs/ tests ordered today include:  No orders of the defined types were placed in this encounter.   Recommend 150 minutes/week of aerobic exercise Low fat, low carb, high fiber diet recommended  Disposition:   FU in 9 months   Signed, Larae Grooms, MD  04/15/2018 8:35 AM    [facility] [place], [place], [place]   [address] Phone: [phone]; Fax: [phone]

## 2018-04-15 NOTE — Patient Instructions (Signed)
Medication Instructions:  Your physician recommends that you continue on your current medications as directed. Please refer to the Current Medication list given to you today.  If you need a refill on your cardiac medications before your next appointment, please call your pharmacy.   Lab work: TODAY: BMET  If you have labs (blood work) drawn today and your tests are completely normal, you will receive your results only by: Marland Kitchen MyChart Message (if you have MyChart) OR . A paper copy in the mail If you have any lab test that is abnormal or we need to change your treatment, we will call you to review the results.  Testing/Procedures: None ordered  Follow-Up: At [facility], you and your health needs are our priority.  As part of our continuing mission to provide you with exceptional heart care, we have created designated Provider Care Teams.  These Care Teams include your primary Cardiologist (physician) and Advanced Practice Providers (APPs -  Physician Assistants and Nurse Practitioners) who all work together to provide you with the care you need, when you need it. . You will need a follow up appointment in 9 months.  Please call our office 2 months in advance to schedule this appointment.  You may see Everette Rank, MD or one of the following Advanced Practice Providers on your designated Care Team:   . Robbie Lis, PA-C . Dayna Dunn, PA-C . Jacolyn Reedy, PA-C  Any Other Special Instructions Will Be Listed Below (If Applicable).

## 2018-04-18 ENCOUNTER — Telehealth: Payer: Self-pay | Admitting: Interventional Cardiology

## 2018-04-18 NOTE — Telephone Encounter (Signed)
  Patient is calling to find out the results of his test

## 2018-04-19 NOTE — Telephone Encounter (Signed)
-----   Message from Corky Crafts, MD sent at 04/18/2018  4:55 PM EST ----- Normal BMet

## 2018-04-19 NOTE — Telephone Encounter (Signed)
The patient has been notified of the result and verbalized understanding.  All questions (if any) were answered. Lattie Haw, RN 04/19/2018 8:07 AM

## 2018-08-07 ENCOUNTER — Other Ambulatory Visit: Payer: Self-pay | Admitting: Interventional Cardiology

## 2018-09-16 ENCOUNTER — Other Ambulatory Visit: Payer: Self-pay | Admitting: Interventional Cardiology

## 2018-09-16 NOTE — Telephone Encounter (Signed)
Outpatient Medication Detail   Disp Refills Start End   carvedilol (COREG) 25 MG tablet 180 tablet 3 04/15/2018    Sig - Route: Take 1 tablet (25 mg total) by mouth 2 (two) times daily with a meal. - Oral   Sent to pharmacy as: carvedilol (COREG) 25 MG tablet   E-Prescribing Status: Receipt confirmed by pharmacy (04/15/2018 8:46 AM EST)   Pharmacy  [id number] - [place], [place]

## 2018-12-16 ENCOUNTER — Ambulatory Visit: Payer: Medicare Other | Admitting: Interventional Cardiology

## 2019-02-23 NOTE — Progress Notes (Deleted)
Cardiology Office Note   Date:  02/23/2019   ID:  Matthew [name], DOB Jan 16, 1941, MRN [id number]  PCP:  [name], MD    No chief complaint on file.  NICM  Wt Readings from Last 3 Encounters:  04/15/18 267 lb 6.4 oz (121.3 kg)  10/05/17 254 lb 9.6 oz (115.5 kg)  03/29/17 268 lb (121.6 kg)       History of Present Illness: Matthew [name] is a 79 y.o. male  who has had issues with HTN and obesity. He lost some weight years ago. He has been as high as 345 lbs. Prior cardiomyopathy:LVEF 50-55% in 2014. Mildly dilated aortic root at that time.  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 Followed by ID. Tolerated antibiotics longterm. He required surgeryin 12/18and tolerated this well. Knee is much better.  He has gained weight since 2018 hospital stay.    Past Medical History:  Diagnosis Date  . CAD (coronary artery disease)   . Cardiomyopathy ([place])   . Heart failure ([place])   . HTN (hypertension)   . Obesity   . Osteoarthritis     Past Surgical History:  Procedure Laterality Date  . CHONDROPLASTY Left 01/11/2017   Procedure: CHONDROPLASTY of medial and patella compartment;  Surgeon: Dorna Leitz, MD;  Location: [facility];  Service: Orthopedics;  Laterality: Left;  . KNEE ARTHROSCOPY Left 01/11/2017   Procedure: ARTHROSCOPIC IRRIGATION AND DEBRIDMENT KNEE;  Surgeon: Dorna Leitz, MD;  Location: [facility];  Service: Orthopedics;  Laterality: Left;  . KNEE SURGERY    . MENISECTOMY Left 01/11/2017   Procedure: Lateral MENISECTOMY;  Surgeon: Dorna Leitz, MD;  Location: [facility];  Service: Orthopedics;  Laterality: Left;  . SYNOVECTOMY Left 01/11/2017   Procedure: Four compartment SYNOVECTOMY;  Surgeon: Dorna Leitz, MD;  Location: [facility];  Service: Orthopedics;  Laterality: Left;     Current Outpatient Medications  Medication Sig Dispense Refill  . amLODipine (NORVASC) 10 MG tablet TAKE 1 TABLET(10 MG) BY MOUTH DAILY 90 tablet 3  . aspirin EC 81 MG tablet  Take 1 tablet (81 mg total) by mouth daily. 90 tablet 3  . atorvastatin (LIPITOR) 10 MG tablet TAKE 1 TABLET(10 MG) BY MOUTH DAILY 90 tablet 3  . carvedilol (COREG) 25 MG tablet Take 1 tablet (25 mg total) by mouth 2 (two) times daily with a meal. 180 tablet 3  . furosemide (LASIX) 40 MG tablet TAKE 1 TABLET BY MOUTH EVERY DAY FOR 5 DAYS A WEEK 90 tablet 2  . ramipril (ALTACE) 10 MG capsule TAKE 1 CAPSULE(10 MG) BY MOUTH TWICE DAILY 180 capsule 3  . sildenafil (VIAGRA) 100 MG tablet Take 1 tablet (100 mg total) by mouth daily as needed for erectile dysfunction. 10 tablet 3  . vancomycin IVPB Inject 1,000 mg into the vein every 12 (twelve) hours. Indication:  Septic arthritis/ synovial infection Last Day of Therapy:  02/07/17 Labs - Sunday/Monday:  CBC/D, BMP, and vancomycin trough. Labs - Thursday:  BMP and vancomycin trough Labs - Every other week:  ESR and CRP 48 Units 0   No current facility-administered medications for this visit.    Allergies:   Patient has no known allergies.    Social History:  The patient  reports that he has never smoked. He has never used smokeless tobacco. He reports that he does not drink alcohol or use drugs.   Family History:  The patient's ***family history includes Diabetes in his brother; Hypertension in his father.  ROS:  Please see the history of present illness.   Otherwise, review of systems are positive for ***.   All other systems are reviewed and negative.    PHYSICAL EXAM: VS:  There were no vitals taken for this visit. , BMI There is no height or weight on file to calculate BMI. GEN: Well nourished, well developed, in no acute distress HEENT: normal Neck: no JVD, carotid bruits, or masses Cardiac: ***RRR; no murmurs, rubs, or gallops,no edema  Respiratory:  clear to auscultation bilaterally, normal work of breathing GI: soft, nontender, nondistended, + BS MS: no deformity or atrophy Skin: warm and dry, no rash Neuro:  Strength and  sensation are intact Psych: euthymic mood, full affect   EKG:   The ekg ordered today demonstrates ***   Recent Labs: 04/15/2018: BUN 17; Creatinine, Ser 1.09; Potassium 4.6; Sodium 140   Lipid Panel    Component Value Date/Time   CHOL 111 10/05/2017 0828   TRIG 59 10/05/2017 0828   HDL 41 10/05/2017 0828   CHOLHDL 2.7 10/05/2017 0828   CHOLHDL 3.2 06/11/2015 0852   VLDL 14 06/11/2015 0852   LDLCALC 58 10/05/2017 0828     Other studies Reviewed: Additional studies/ records that were reviewed today with results demonstrating: ***.   ASSESSMENT AND PLAN:  1. Prior NICM: 2. Obesity: 3. HTN: 4. CAD: 5. Hyperlipidemia:   Current medicines are reviewed at length with the patient today.  The patient concerns regarding his medicines were addressed.  The following changes have been made:  No change***  Labs/ tests ordered today include: *** No orders of the defined types were placed in this encounter.   Recommend 150 minutes/week of aerobic exercise Low fat, low carb, high fiber diet recommended  Disposition:   FU in ***   Signed, Larae Grooms, MD  02/23/2019 9:53 PM    [facility] [place], [place], [place]  [address] Phone: [phone]; Fax: [phone]

## 2019-02-25 ENCOUNTER — Ambulatory Visit: Payer: Medicare Other | Admitting: Interventional Cardiology

## 2019-03-23 NOTE — Progress Notes (Deleted)
Cardiology Office Note   Date:  03/23/2019   ID:  Matthew [name], DOB 1940/05/08, MRN [id number]  PCP:  [name], MD    No chief complaint on file.  Dilated cardiomyopathy  Wt Readings from Last 3 Encounters:  04/15/18 267 lb 6.4 oz (121.3 kg)  10/05/17 254 lb 9.6 oz (115.5 kg)  03/29/17 268 lb (121.6 kg)       History of Present Illness: Matthew [name] is a 79 y.o. male  who has had issues with HTN and obesity. He lost some weight years ago. He has been as high as 345 lbs. Prior cardiomyopathy:LVEF 50-55% in 2014. Mildly dilated aortic root at that time.  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 Followed by ID. Tolerated antibiotics longterm. He required surgeryin 12/18and tolerated this well. Knee is much better.  He has gained weight since 2018 hospital stay.  He is eating more.       Past Medical History:  Diagnosis Date  . CAD (coronary artery disease)   . Cardiomyopathy ([facility])   . Heart failure ([place])   . HTN (hypertension)   . Obesity   . Osteoarthritis     Past Surgical History:  Procedure Laterality Date  . CHONDROPLASTY Left 01/11/2017   Procedure: CHONDROPLASTY of medial and patella compartment;  Surgeon: Dorna Leitz, MD;  Location: [facility];  Service: Orthopedics;  Laterality: Left;  . KNEE ARTHROSCOPY Left 01/11/2017   Procedure: ARTHROSCOPIC IRRIGATION AND DEBRIDMENT KNEE;  Surgeon: Dorna Leitz, MD;  Location: [facility];  Service: Orthopedics;  Laterality: Left;  . KNEE SURGERY    . MENISECTOMY Left 01/11/2017   Procedure: Lateral MENISECTOMY;  Surgeon: Dorna Leitz, MD;  Location: [facility];  Service: Orthopedics;  Laterality: Left;  . SYNOVECTOMY Left 01/11/2017   Procedure: Four compartment SYNOVECTOMY;  Surgeon: Dorna Leitz, MD;  Location: [facility];  Service: Orthopedics;  Laterality: Left;     Current Outpatient Medications  Medication Sig Dispense Refill  . amLODipine (NORVASC) 10 MG tablet TAKE 1 TABLET(10 MG) BY MOUTH  DAILY 90 tablet 3  . aspirin EC 81 MG tablet Take 1 tablet (81 mg total) by mouth daily. 90 tablet 3  . atorvastatin (LIPITOR) 10 MG tablet TAKE 1 TABLET(10 MG) BY MOUTH DAILY 90 tablet 3  . carvedilol (COREG) 25 MG tablet Take 1 tablet (25 mg total) by mouth 2 (two) times daily with a meal. 180 tablet 3  . furosemide (LASIX) 40 MG tablet TAKE 1 TABLET BY MOUTH EVERY DAY FOR 5 DAYS A WEEK 90 tablet 2  . ramipril (ALTACE) 10 MG capsule TAKE 1 CAPSULE(10 MG) BY MOUTH TWICE DAILY 180 capsule 3  . sildenafil (VIAGRA) 100 MG tablet Take 1 tablet (100 mg total) by mouth daily as needed for erectile dysfunction. 10 tablet 3  . vancomycin IVPB Inject 1,000 mg into the vein every 12 (twelve) hours. Indication:  Septic arthritis/ synovial infection Last Day of Therapy:  02/07/17 Labs - Sunday/Monday:  CBC/D, BMP, and vancomycin trough. Labs - Thursday:  BMP and vancomycin trough Labs - Every other week:  ESR and CRP 48 Units 0   No current facility-administered medications for this visit.    Allergies:   Patient has no known allergies.    Social History:  The patient  reports that he has never smoked. He has never used smokeless tobacco. He reports that he does not drink alcohol or use drugs.   Family History:  The patient's ***family history includes Diabetes in  his brother; Hypertension in his father.    ROS:  Please see the history of present illness.   Otherwise, review of systems are positive for ***.   All other systems are reviewed and negative.    PHYSICAL EXAM: VS:  There were no vitals taken for this visit. , BMI There is no height or weight on file to calculate BMI. GEN: Well nourished, well developed, in no acute distress HEENT: normal Neck: no JVD, carotid bruits, or masses Cardiac: ***RRR; no murmurs, rubs, or gallops,no edema  Respiratory:  clear to auscultation bilaterally, normal work of breathing GI: soft, nontender, nondistended, + BS MS: no deformity or atrophy Skin:  warm and dry, no rash Neuro:  Strength and sensation are intact Psych: euthymic mood, full affect   EKG:   The ekg ordered today demonstrates ***   Recent Labs: 04/15/2018: BUN 17; Creatinine, Ser 1.09; Potassium 4.6; Sodium 140   Lipid Panel    Component Value Date/Time   CHOL 111 10/05/2017 0828   TRIG 59 10/05/2017 0828   HDL 41 10/05/2017 0828   CHOLHDL 2.7 10/05/2017 0828   CHOLHDL 3.2 06/11/2015 0852   VLDL 14 06/11/2015 0852   LDLCALC 58 10/05/2017 0828     Other studies Reviewed: Additional studies/ records that were reviewed today with results demonstrating: ***.   ASSESSMENT AND PLAN:  1. Prior NICM/dilated cardiomyopathy: EF has normalized as of 2014.  He did have a fair number of PVCs noted on prior ECGs which have been mostly asymptomatic. 2. Obesity: His target weight was 250 pounds. 3. Hypertension: 4. Coronary artery disease: Branch vessel disease noted in 2001.  Angina has been controlled with medical therapy. 5. Hyperlipidemia: Has been well controlled in the past.   Current medicines are reviewed at length with the patient today.  The patient concerns regarding his medicines were addressed.  The following changes have been made:  No change***  Labs/ tests ordered today include: *** No orders of the defined types were placed in this encounter.   Recommend 150 minutes/week of aerobic exercise Low fat, low carb, high fiber diet recommended  Disposition:   FU in ***   Signed, Larae Grooms, MD  03/23/2019 11:08 PM    [facility] [place] [place], [place], [place]  [address] Phone: [phone]; Fax: [phone]

## 2019-03-25 ENCOUNTER — Telehealth: Payer: Self-pay | Admitting: Interventional Cardiology

## 2019-03-25 ENCOUNTER — Ambulatory Visit: Payer: Medicare Other | Admitting: Interventional Cardiology

## 2019-03-25 NOTE — Telephone Encounter (Signed)
We are recommending the COVID-19 vaccine to all of our patients. Cardiac medications (including blood thinners) should not deter anyone from being vaccinated and there is no need to hold any of those medications prior to vaccine administration.     Currently, there is a hotline to call (active 02/21/19) to schedule vaccination appointments as no walk-ins will be accepted.   Number: [phone].    If an appointment is not available please go to [email or web address] to sign up for notification when additional vaccine appointments are available.   If you have further questions or concerns about the vaccine process, please visit [email or web address] or contact your primary care physician.   

## 2019-03-28 DIAGNOSIS — Z23 Encounter for immunization: Secondary | ICD-10-CM | POA: Diagnosis not present

## 2019-04-21 NOTE — Progress Notes (Signed)
Cardiology Office Note   Date:  04/22/2019   ID:  Matthew [name], DOB [birth year], MRN [id number]  PCP:  [name], MD    No chief complaint on file.  NICM  Wt Readings from Last 3 Encounters:  04/22/19 260 lb 12.8 oz (118.3 kg)  04/15/18 267 lb 6.4 oz (121.3 kg)  10/05/17 254 lb 9.6 oz (115.5 kg)       History of Present Illness: Matthew [name] is a 79 y.o. male  who has had issues with HTN and obesity. He lost some weight years ago. He has been as high as 345 lbs. Prior cardiomyopathy:LVEF 50-55% in 2014. Mildly dilated aortic root at that time.  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 Followed by ID. Tolerated antibiotics longterm. He required surgeryin 12/18and tolerated this well. Knee is much better.  He has gained weight since 2018 hospital stay.   Since the last visit, he lost weight.  Portion control.   Walks some.  Denies : Chest pain. Dizziness. Leg edema. Nitroglycerin use. Orthopnea. Palpitations. Paroxysmal nocturnal dyspnea. Shortness of breath. Syncope.   He avoids salt. He does cook at home.     Past Medical History:  Diagnosis Date  . CAD (coronary artery disease)   . Cardiomyopathy ([facility])   . Heart failure ([facility])   . HTN (hypertension)   . Obesity   . Osteoarthritis     Past Surgical History:  Procedure Laterality Date  . CHONDROPLASTY Left 01/11/2017   Procedure: CHONDROPLASTY of medial and patella compartment;  Surgeon: Dorna Leitz, MD;  Location: [facility];  Service: Orthopedics;  Laterality: Left;  . KNEE ARTHROSCOPY Left 01/11/2017   Procedure: ARTHROSCOPIC IRRIGATION AND DEBRIDMENT KNEE;  Surgeon: Dorna Leitz, MD;  Location: [facility];  Service: Orthopedics;  Laterality: Left;  . KNEE SURGERY    . MENISECTOMY Left 01/11/2017   Procedure: Lateral MENISECTOMY;  Surgeon: Dorna Leitz, MD;  Location: [facility];  Service: Orthopedics;  Laterality: Left;  . SYNOVECTOMY Left 01/11/2017   Procedure: Four compartment  SYNOVECTOMY;  Surgeon: Dorna Leitz, MD;  Location: [facility];  Service: Orthopedics;  Laterality: Left;     Current Outpatient Medications  Medication Sig Dispense Refill  . amLODipine (NORVASC) 10 MG tablet TAKE 1 TABLET(10 MG) BY MOUTH DAILY 90 tablet 3  . aspirin EC 81 MG tablet Take 1 tablet (81 mg total) by mouth daily. 90 tablet 3  . atorvastatin (LIPITOR) 10 MG tablet TAKE 1 TABLET(10 MG) BY MOUTH DAILY 90 tablet 3  . carvedilol (COREG) 25 MG tablet Take 1 tablet (25 mg total) by mouth 2 (two) times daily with a meal. 180 tablet 3  . furosemide (LASIX) 40 MG tablet TAKE 1 TABLET BY MOUTH EVERY DAY FOR 5 DAYS A WEEK 90 tablet 2  . ramipril (ALTACE) 10 MG capsule TAKE 1 CAPSULE(10 MG) BY MOUTH TWICE DAILY 180 capsule 3  . sildenafil (VIAGRA) 100 MG tablet Take 1 tablet (100 mg total) by mouth daily as needed for erectile dysfunction. 10 tablet 3  . vancomycin IVPB Inject 1,000 mg into the vein every 12 (twelve) hours. Indication:  Septic arthritis/ synovial infection Last Day of Therapy:  02/07/17 Labs - Sunday/Monday:  CBC/D, BMP, and vancomycin trough. Labs - Thursday:  BMP and vancomycin trough Labs - Every other week:  ESR and CRP 48 Units 0   No current facility-administered medications for this visit.    Allergies:   Patient has no known allergies.    Social History:  The patient  reports that he has never smoked. He has never used smokeless tobacco. He reports that he does not drink alcohol or use drugs.   Family History:  The patient's family history includes Diabetes in his brother; Hypertension in his father.    ROS:  Please see the history of present illness.   Otherwise, review of systems are positive for intentional.   All other systems are reviewed and negative.    PHYSICAL EXAM: VS:  BP 118/80   Pulse 71   Ht 6' 4" (1.93 m)   Wt 260 lb 12.8 oz (118.3 kg)   SpO2 99%   BMI 31.75 kg/m  , BMI Body mass index is 31.75 kg/m. GEN: Well nourished, well developed, in  no acute distress  HEENT: normal  Neck: no JVD, carotid bruits, or masses Cardiac: RRR, premature beats; no murmurs, rubs, or gallops,no edema  Respiratory:  clear to auscultation bilaterally, normal work of breathing GI: soft, nontender, nondistended, + BS MS: no deformity or atrophy  Skin: warm and dry, no rash Neuro:  Strength and sensation are intact Psych: euthymic mood, full affect   EKG:   The ekg ordered today demonstrates NSR, PVCs, RBBB   Recent Labs: No results found for requested labs within last 8760 hours.   Lipid Panel    Component Value Date/Time   CHOL 111 10/05/2017 0828   TRIG 59 10/05/2017 0828   HDL 41 10/05/2017 0828   CHOLHDL 2.7 10/05/2017 0828   CHOLHDL 3.2 06/11/2015 0852   VLDL 14 06/11/2015 0852   LDLCALC 58 10/05/2017 0828     Other studies Reviewed: Additional studies/ records that were reviewed today with results demonstrating: labs from 2019 reviewed.   ASSESSMENT AND PLAN:  1. Prior NICM: Resolved. No CHF sx.  RF modification. Check labs today.  2. Obesity: Lost weight.  COntinue portion control.  Avoid processed foods.  3. HTN: The current medical regimen is effective;  continue present plan and medications. 4. CAD: Branch vessel disease in 2001.  5. Hyperlipidemia: Needs lipids today.  Continue atorvastatin.   Current medicines are reviewed at length with the patient today.  The patient concerns regarding his medicines were addressed.  The following changes have been made:  No change  Labs/ tests ordered today include: CMet, lipids, CBC No orders of the defined types were placed in this encounter.   Recommend 150 minutes/week of aerobic exercise Low fat, low carb, high fiber diet recommended  Disposition:   FU in 1 year   Signed, Larae Grooms, MD  04/22/2019 9:26 AM    [facility] [place], [place], [place]  [address] Phone: [phone]; Fax: [phone]

## 2019-04-22 ENCOUNTER — Other Ambulatory Visit: Payer: Self-pay

## 2019-04-22 ENCOUNTER — Encounter: Payer: Self-pay | Admitting: Interventional Cardiology

## 2019-04-22 ENCOUNTER — Ambulatory Visit (INDEPENDENT_AMBULATORY_CARE_PROVIDER_SITE_OTHER): Payer: Medicare Other | Admitting: Interventional Cardiology

## 2019-04-22 VITALS — BP 118/80 | HR 71 | Ht 76.0 in | Wt 260.8 lb

## 2019-04-22 DIAGNOSIS — I25118 Atherosclerotic heart disease of native coronary artery with other forms of angina pectoris: Secondary | ICD-10-CM

## 2019-04-22 DIAGNOSIS — I10 Essential (primary) hypertension: Secondary | ICD-10-CM

## 2019-04-22 DIAGNOSIS — E782 Mixed hyperlipidemia: Secondary | ICD-10-CM | POA: Diagnosis not present

## 2019-04-22 DIAGNOSIS — I420 Dilated cardiomyopathy: Secondary | ICD-10-CM | POA: Diagnosis not present

## 2019-04-22 DIAGNOSIS — I2510 Atherosclerotic heart disease of native coronary artery without angina pectoris: Secondary | ICD-10-CM | POA: Diagnosis not present

## 2019-04-22 LAB — COMPREHENSIVE METABOLIC PANEL
ALT: 8 IU/L (ref 0–44)
AST: 14 IU/L (ref 0–40)
Albumin/Globulin Ratio: 1.2 (ref 1.2–2.2)
Albumin: 4 g/dL (ref 3.7–4.7)
Alkaline Phosphatase: 131 IU/L — ABNORMAL HIGH (ref 39–117)
BUN/Creatinine Ratio: 12 (ref 10–24)
BUN: 13 mg/dL (ref 8–27)
Bilirubin Total: 0.6 mg/dL (ref 0.0–1.2)
CO2: 19 mmol/L — ABNORMAL LOW (ref 20–29)
Calcium: 9.2 mg/dL (ref 8.6–10.2)
Chloride: 103 mmol/L (ref 96–106)
Creatinine, Ser: 1.06 mg/dL (ref 0.76–1.27)
GFR calc Af Amer: 77 mL/min/{1.73_m2} (ref >59)
GFR calc non Af Amer: 67 mL/min/{1.73_m2} (ref >59)
Globulin, Total: 3.4 g/dL (ref 1.5–4.5)
Glucose: 82 mg/dL (ref 65–99)
Potassium: 4.1 mmol/L (ref 3.5–5.2)
Sodium: 141 mmol/L (ref 134–144)
Total Protein: 7.4 g/dL (ref 6.0–8.5)

## 2019-04-22 LAB — CBC
Hematocrit: 41.8 % (ref 37.5–51.0)
Hemoglobin: 13.5 g/dL (ref 13.0–17.7)
MCH: 26.7 pg (ref 26.6–33.0)
MCHC: 32.3 g/dL (ref 31.5–35.7)
MCV: 83 fL (ref 79–97)
Platelets: 180 10*3/uL (ref 150–450)
RBC: 5.05 x10E6/uL (ref 4.14–5.80)
RDW: 13.8 % (ref 11.6–15.4)
WBC: 6 10*3/uL (ref 3.4–10.8)

## 2019-04-22 LAB — LIPID PANEL
Chol/HDL Ratio: 2.3 ratio (ref 0.0–5.0)
Cholesterol, Total: 105 mg/dL (ref 100–199)
HDL: 45 mg/dL (ref >39)
LDL Chol Calc (NIH): 46 mg/dL (ref 0–99)
Triglycerides: 66 mg/dL (ref 0–149)
VLDL Cholesterol Cal: 14 mg/dL (ref 5–40)

## 2019-04-22 MED ORDER — ATORVASTATIN CALCIUM 10 MG PO TABS: ORAL_TABLET | ORAL | 3 refills | Status: DC

## 2019-04-22 NOTE — Patient Instructions (Signed)
Medication Instructions:  Your physician recommends that you continue on your current medications as directed. Please refer to the Current Medication list given to you today.  *If you need a refill on your cardiac medications before your next appointment, please call your pharmacy*   Lab Work: CMET, LIPIDS, CBC today If you have labs (blood work) drawn today and your tests are completely normal, you will receive your results only by: [facility] MyChart Message (if you have MyChart) OR . A paper copy in the mail If you have any lab test that is abnormal or we need to change your treatment, we will call you to review the results.   Testing/Procedures: None today   Follow-Up: At [facility], you and your health needs are our priority.  As part of our continuing mission to provide you with exceptional heart care, we have created designated Provider Care Teams.  These Care Teams include your primary Cardiologist (physician) and Advanced Practice Providers (APPs -  Physician Assistants and Nurse Practitioners) who all work together to provide you with the care you need, when you need it.  We recommend signing up for the patient portal called "MyChart".  Sign up information is provided on this After Visit Summary.  MyChart is used to connect with patients for Virtual Visits (Telemedicine).  Patients are able to view lab/test results, encounter notes, upcoming appointments, etc.  Non-urgent messages can be sent to your provider as well.   To learn more about what you can do with MyChart, go to [email or web address].    Your next appointment:   1 year(s)  The format for your next appointment:   In Person  Provider:   You may see Lance Muss, MD or one of the following Advanced Practice Providers on your designated Care Team:    Ronie Spies, PA-C  Jacolyn Reedy, PA-C

## 2019-04-25 DIAGNOSIS — Z23 Encounter for immunization: Secondary | ICD-10-CM | POA: Diagnosis not present

## 2019-04-30 ENCOUNTER — Other Ambulatory Visit: Payer: Self-pay | Admitting: *Deleted

## 2019-04-30 MED ORDER — RAMIPRIL 10 MG PO CAPS: ORAL_CAPSULE | ORAL | 3 refills | Status: DC

## 2019-04-30 MED ORDER — AMLODIPINE BESYLATE 10 MG PO TABS: ORAL_TABLET | ORAL | 3 refills | Status: DC

## 2019-06-03 ENCOUNTER — Other Ambulatory Visit: Payer: Self-pay

## 2019-06-03 MED ORDER — CARVEDILOL 25 MG PO TABS: 25.0000 mg | ORAL_TABLET | Freq: Two times a day (BID) | ORAL | 3 refills | Status: DC

## 2019-08-12 ENCOUNTER — Other Ambulatory Visit: Payer: Self-pay | Admitting: Interventional Cardiology

## 2019-10-02 DIAGNOSIS — M170 Bilateral primary osteoarthritis of knee: Secondary | ICD-10-CM | POA: Diagnosis not present

## 2019-10-02 DIAGNOSIS — M25562 Pain in left knee: Secondary | ICD-10-CM | POA: Diagnosis not present

## 2019-10-02 DIAGNOSIS — M19012 Primary osteoarthritis, left shoulder: Secondary | ICD-10-CM | POA: Diagnosis not present

## 2019-10-02 DIAGNOSIS — M25561 Pain in right knee: Secondary | ICD-10-CM | POA: Diagnosis not present

## 2019-10-02 DIAGNOSIS — M67912 Unspecified disorder of synovium and tendon, left shoulder: Secondary | ICD-10-CM | POA: Diagnosis not present

## 2019-12-06 DIAGNOSIS — Z23 Encounter for immunization: Secondary | ICD-10-CM | POA: Diagnosis not present

## 2019-12-13 DIAGNOSIS — Z23 Encounter for immunization: Secondary | ICD-10-CM | POA: Diagnosis not present

## 2020-02-24 ENCOUNTER — Other Ambulatory Visit: Payer: Self-pay | Admitting: Interventional Cardiology

## 2020-03-24 ENCOUNTER — Other Ambulatory Visit: Payer: Self-pay | Admitting: Interventional Cardiology

## 2020-04-12 ENCOUNTER — Other Ambulatory Visit: Payer: Self-pay | Admitting: Interventional Cardiology

## 2020-04-15 ENCOUNTER — Other Ambulatory Visit: Payer: Self-pay | Admitting: Interventional Cardiology

## 2020-04-16 ENCOUNTER — Ambulatory Visit: Payer: Medicare Other | Admitting: Interventional Cardiology

## 2020-05-05 ENCOUNTER — Other Ambulatory Visit: Payer: Self-pay | Admitting: Interventional Cardiology

## 2020-05-08 NOTE — Progress Notes (Signed)
Cardiology Office Note   Date:  05/10/2020   ID:  Matthew [name], DOB May 13, 1940, MRN [id number]  PCP:  [name], MD    No chief complaint on file.  CAD  Wt Readings from Last 3 Encounters:  05/10/20 243 lb 3.2 oz (110.3 kg)  04/22/19 260 lb 12.8 oz (118.3 kg)  04/15/18 267 lb 6.4 oz (121.3 kg)       History of Present Illness: Matthew [name] is a 80 y.o. male  who has had issues with HTN and obesity. He lost some weight years ago. He has been as high as 345 lbs. Prior cardiomyopathy:LVEF normalized to 50-55% in 2014. Mildly dilated aortic root at that time.  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 Followed by ID. Tolerated antibiotics longterm. He required surgeryin 12/18and tolerated this well. Knee is much better.  He has gained weight since 2018 hospital stay.   In 2020, he lost weight with  Portion control.   Denies : Chest pain. Dizziness. Leg edema. Nitroglycerin use. Orthopnea. Palpitations. Paroxysmal nocturnal dyspnea. Shortness of breath. Syncope.   Continued intentional weight loss.  Walks a lot as well.       Past Medical History:  Diagnosis Date  . CAD (coronary artery disease)   . Cardiomyopathy ([place])   . Heart failure ([place])   . HTN (hypertension)   . Obesity   . Osteoarthritis     Past Surgical History:  Procedure Laterality Date  . CHONDROPLASTY Left 01/11/2017   Procedure: CHONDROPLASTY of medial and patella compartment;  Surgeon: Dorna Leitz, MD;  Location: [facility];  Service: Orthopedics;  Laterality: Left;  . KNEE ARTHROSCOPY Left 01/11/2017   Procedure: ARTHROSCOPIC IRRIGATION AND DEBRIDMENT KNEE;  Surgeon: Dorna Leitz, MD;  Location: [facility];  Service: Orthopedics;  Laterality: Left;  . KNEE SURGERY    . MENISECTOMY Left 01/11/2017   Procedure: Lateral MENISECTOMY;  Surgeon: Dorna Leitz, MD;  Location: [facility];  Service: Orthopedics;  Laterality: Left;  . SYNOVECTOMY Left 01/11/2017   Procedure: Four  compartment SYNOVECTOMY;  Surgeon: Dorna Leitz, MD;  Location: [facility];  Service: Orthopedics;  Laterality: Left;     Current Outpatient Medications  Medication Sig Dispense Refill  . amLODipine (NORVASC) 10 MG tablet TAKE 1 TABLET(10 MG) BY MOUTH DAILY. Please keep upcoming appointment in March 2022 for future refills. Thank you 90 tablet 0  . aspirin EC 81 MG tablet Take 1 tablet (81 mg total) by mouth daily. 90 tablet 3  . atorvastatin (LIPITOR) 10 MG tablet TAKE 1 TABLET(10 MG) BY MOUTH DAILY 90 tablet 0  . carvedilol (COREG) 25 MG tablet TAKE 1 TABLET(25 MG) BY MOUTH TWICE DAILY WITH A MEAL 180 tablet 0  . furosemide (LASIX) 40 MG tablet TAKE 1 TABLET BY MOUTH EVERY DAY FOR 5 DAYS A WEEK 90 tablet 2  . ramipril (ALTACE) 10 MG capsule TAKE 1 CAPSULE(10 MG) BY MOUTH TWICE DAILY. Please keep upcoming appt in March 2022 with [name] before anymore refills. Thank you 180 capsule 0  . sildenafil (VIAGRA) 100 MG tablet Take 1 tablet (100 mg total) by mouth daily as needed for erectile dysfunction. 10 tablet 3  . vancomycin IVPB Inject 1,000 mg into the vein every 12 (twelve) hours. Indication:  Septic arthritis/ synovial infection Last Day of Therapy:  02/07/17 Labs - Sunday/Monday:  CBC/D, BMP, and vancomycin trough. Labs - Thursday:  BMP and vancomycin trough Labs - Every other week:  ESR and CRP 48 Units 0  No current facility-administered medications for this visit.    Allergies:   Patient has no known allergies.    Social History:  The patient  reports that he has never smoked. He has never used smokeless tobacco. He reports that he does not drink alcohol and does not use drugs.   Family History:  The patient's family history includes Diabetes in his brother; Hypertension in his father.    ROS:  Please see the history of present illness.   Otherwise, review of systems are positive for intentional weight loss with portion control.   All other systems are reviewed and negative.     PHYSICAL EXAM: VS:  BP 114/62   Pulse 87   Ht '6\' 4"'  (1.93 m)   Wt 243 lb 3.2 oz (110.3 kg)   SpO2 98%   BMI 29.60 kg/m  , BMI Body mass index is 29.6 kg/m. GEN: Well nourished, well developed, in no acute distress  HEENT: normal  Neck: no JVD, carotid bruits, or masses Cardiac: RRR with premature beats; no murmurs, rubs, or gallops,no edema  Respiratory:  clear to auscultation bilaterally, normal work of breathing GI: soft, nontender, nondistended, + BS MS: no deformity or atrophy  Skin: warm and dry, no rash Neuro:  Strength and sensation are intact Psych: euthymic mood, full affect   EKG:   The ekg ordered today demonstrates normal sinus rhythm, frequent PVCs   Recent Labs: No results found for requested labs within last 8760 hours.   Lipid Panel    Component Value Date/Time   CHOL 105 04/22/2019 0939   TRIG 66 04/22/2019 0939   HDL 45 04/22/2019 0939   CHOLHDL 2.3 04/22/2019 0939   CHOLHDL 3.2 06/11/2015 0852   VLDL 14 06/11/2015 0852   LDLCALC 46 04/22/2019 0939     Other studies Reviewed: Additional studies/ records that were reviewed today with results demonstrating: Labs reviewed.   ASSESSMENT AND PLAN:  1. Prior NICM: EF normalized to 50-55% in 2014.  He appears euvolemic at this time.  Continue current medications. 2. Dilated aortic root: Noted in 2014.  Will repeat echo.  3. Obesity: COntinue portion control for weight loss.  Avoid processed foods.  He avoids salt as well. 4. HTN: The current   Home readings are well controlled. medical regimen is effective;  continue present plan and medications. 5. CAD: Branch vessel disease in 2001. Angina has been controlled with meds.  Continue aggressive secondary prevention including healthy diet and lipid control. 6. Hyperlipidemia: Check lipids today. 7. Check TSH given occasional fatigue and age.  It does not appear that it has been checked recently.   Current medicines are reviewed at length with the  patient today.  The patient concerns regarding his medicines were addressed.  The following changes have been made:  No change  Labs/ tests ordered today include:  No orders of the defined types were placed in this encounter.   Recommend 150 minutes/week of aerobic exercise Low fat, low carb, high fiber diet recommended  Disposition:   FU in 1 year   Signed, Larae Grooms, MD  05/10/2020 8:40 AM    [facility] [place], [place], [place]  [address] Phone: [phone]; Fax: [phone]

## 2020-05-10 ENCOUNTER — Ambulatory Visit (INDEPENDENT_AMBULATORY_CARE_PROVIDER_SITE_OTHER): Payer: Medicare Other | Admitting: Interventional Cardiology

## 2020-05-10 ENCOUNTER — Ambulatory Visit (HOSPITAL_COMMUNITY): Payer: Medicare Other | Attending: Internal Medicine

## 2020-05-10 ENCOUNTER — Encounter: Payer: Self-pay | Admitting: Interventional Cardiology

## 2020-05-10 ENCOUNTER — Other Ambulatory Visit: Payer: Self-pay

## 2020-05-10 VITALS — BP 114/62 | HR 87 | Ht 76.0 in | Wt 243.2 lb

## 2020-05-10 DIAGNOSIS — I420 Dilated cardiomyopathy: Secondary | ICD-10-CM | POA: Diagnosis not present

## 2020-05-10 DIAGNOSIS — I77810 Thoracic aortic ectasia: Secondary | ICD-10-CM

## 2020-05-10 DIAGNOSIS — I10 Essential (primary) hypertension: Secondary | ICD-10-CM | POA: Diagnosis not present

## 2020-05-10 DIAGNOSIS — R5383 Other fatigue: Secondary | ICD-10-CM

## 2020-05-10 DIAGNOSIS — E782 Mixed hyperlipidemia: Secondary | ICD-10-CM

## 2020-05-10 DIAGNOSIS — I25118 Atherosclerotic heart disease of native coronary artery with other forms of angina pectoris: Secondary | ICD-10-CM

## 2020-05-10 LAB — COMPREHENSIVE METABOLIC PANEL
ALT: 10 IU/L (ref 0–44)
AST: 15 IU/L (ref 0–40)
Albumin/Globulin Ratio: 1.1 — ABNORMAL LOW (ref 1.2–2.2)
Albumin: 4 g/dL (ref 3.7–4.7)
Alkaline Phosphatase: 124 IU/L — ABNORMAL HIGH (ref 44–121)
BUN/Creatinine Ratio: 13 (ref 10–24)
BUN: 17 mg/dL (ref 8–27)
Bilirubin Total: 0.5 mg/dL (ref 0.0–1.2)
CO2: 22 mmol/L (ref 20–29)
Calcium: 9.3 mg/dL (ref 8.6–10.2)
Chloride: 103 mmol/L (ref 96–106)
Creatinine, Ser: 1.31 mg/dL — ABNORMAL HIGH (ref 0.76–1.27)
Globulin, Total: 3.5 g/dL (ref 1.5–4.5)
Glucose: 89 mg/dL (ref 65–99)
Potassium: 4.2 mmol/L (ref 3.5–5.2)
Sodium: 140 mmol/L (ref 134–144)
Total Protein: 7.5 g/dL (ref 6.0–8.5)
eGFR: 55 mL/min/{1.73_m2} — ABNORMAL LOW (ref >59)

## 2020-05-10 LAB — LIPID PANEL
Chol/HDL Ratio: 2.4 ratio (ref 0.0–5.0)
Cholesterol, Total: 97 mg/dL — ABNORMAL LOW (ref 100–199)
HDL: 40 mg/dL (ref >39)
LDL Chol Calc (NIH): 44 mg/dL (ref 0–99)
Triglycerides: 54 mg/dL (ref 0–149)
VLDL Cholesterol Cal: 13 mg/dL (ref 5–40)

## 2020-05-10 LAB — ECHOCARDIOGRAM COMPLETE
AR max vel: 1.48 cm2
AV Area VTI: 1.48 cm2
AV Area mean vel: 1.51 cm2
AV Mean grad: 7.5 mmHg
AV Peak grad: 15.7 mmHg
Ao pk vel: 1.98 m/s
Height: 76 in
MV M vel: 4.4 m/s
MV Peak grad: 77.6 mmHg
S' Lateral: 5.2 cm
Weight: 3891.2 oz

## 2020-05-10 LAB — CBC
Hematocrit: 40 % (ref 37.5–51.0)
Hemoglobin: 13 g/dL (ref 13.0–17.7)
MCH: 26.5 pg — ABNORMAL LOW (ref 26.6–33.0)
MCHC: 32.5 g/dL (ref 31.5–35.7)
MCV: 82 fL (ref 79–97)
Platelets: 176 10*3/uL (ref 150–450)
RBC: 4.91 x10E6/uL (ref 4.14–5.80)
RDW: 13.8 % (ref 11.6–15.4)
WBC: 6.5 10*3/uL (ref 3.4–10.8)

## 2020-05-10 LAB — TSH: TSH: 1.52 u[IU]/mL (ref 0.450–4.500)

## 2020-05-10 NOTE — Progress Notes (Signed)
Patient ID: Matthew [name], male   DOB: January 17, 1941, 80 y.o.   MRN: [id number]  Patient declined the administration of Definity. AG

## 2020-05-10 NOTE — Patient Instructions (Signed)
Medication Instructions:  Your physician recommends that you continue on your current medications as directed. Please refer to the Current Medication list given to you today.  *If you need a refill on your cardiac medications before your next appointment, please call your pharmacy*   Lab Work: Lab work to be done today--CBC, CMET, Lipids, TSH If you have labs (blood work) drawn today and your tests are completely normal, you will receive your results only by: Marland Kitchen MyChart Message (if you have MyChart) OR . A paper copy in the mail If you have any lab test that is abnormal or we need to change your treatment, we will call you to review the results.   Testing/Procedures: Your physician has requested that you have an echocardiogram. Echocardiography is a painless test that uses sound waves to create images of your heart. It provides your doctor with information about the size and shape of your heart and how well your heart's chambers and valves are working. This procedure takes approximately one hour. There are no restrictions for this procedure.     Follow-Up: At [facility], you and your health needs are our priority.  As part of our continuing mission to provide you with exceptional heart care, we have created designated Provider Care Teams.  These Care Teams include your primary Cardiologist (physician) and Advanced Practice Providers (APPs -  Physician Assistants and Nurse Practitioners) who all work together to provide you with the care you need, when you need it.  We recommend signing up for the patient portal called "MyChart".  Sign up information is provided on this After Visit Summary.  MyChart is used to connect with patients for Virtual Visits (Telemedicine).  Patients are able to view lab/test results, encounter notes, upcoming appointments, etc.  Non-urgent messages can be sent to your provider as well.   To learn more about what you can do with MyChart, go to  [email or web address].    Your next appointment:   12 month(s)  The format for your next appointment:   In Person  Provider:   You may see Lance Muss, MD or one of the following Advanced Practice Providers on your designated Care Team:    Ronie Spies, PA-C  Jacolyn Reedy, PA-C    Other Instructions

## 2020-05-12 ENCOUNTER — Telehealth: Payer: Self-pay | Admitting: *Deleted

## 2020-05-12 NOTE — Telephone Encounter (Signed)
Message from [place], [place] D   he's on max dose ramipril so he would need a 36 hour washout, then would qualify for mid dose Entresto 49-51mg  BID    and would need to see [place] about 2 weeks after that

## 2020-05-13 MED ORDER — ENTRESTO 49-51 MG PO TABS: 1.0000 | ORAL_TABLET | Freq: Two times a day (BID) | ORAL | 0 refills | Status: DC

## 2020-05-13 NOTE — Telephone Encounter (Signed)
Patient notified.  Prescription sent to [facility] on [address].  Patient aware first month should be free.  He reports his daughter helps him with his medications.  He will have her call us if she has any questions. Patient scheduled to see pharmacist on 06/03/20 at 9:00.

## 2020-05-14 ENCOUNTER — Telehealth: Payer: Self-pay | Admitting: Interventional Cardiology

## 2020-05-14 NOTE — Telephone Encounter (Signed)
Spoke to dtr, dpr on file. dtr reports that dad canNOT afford the Entresto, charging $700/month. Aware that I will forward this to the Varanasi's nurse and our prior auth nurse to address cost avenues w/ pt (I am unsure if this has already been started). Please call dtr to further discuss/advise. Aware it may be next week before getting a return call about this. dtr agreeable to plan.

## 2020-05-14 NOTE — Telephone Encounter (Signed)
Patients daughter calling for echo results. She states that her father would like Dr. Hoyle Barr nurse to explain the results to her.

## 2020-05-17 NOTE — Telephone Encounter (Signed)
**Note De-Identified Carmeron Heady Obfuscation** Following message received from covermymeds: [employer] Key: [id number] - PA Case ID: [id number] Outcome: Approved today Your PA request has been approved.  Additional information will be provided in the approval communication. (Message 1145) Drug: Sherryll Burger 49-51MG  tablets Form: Ambulance person PA Form (2017 NCPDP)  I have notified [facility] pharmacy of this approval.

## 2020-05-17 NOTE — Telephone Encounter (Signed)
**Note De-Identified Matthew [name] Obfuscation** Per [facility] a PA is required for the pts Entresto.  I started a Entresto PA through covermymeds. Key: [name] is aware that I have done this PA and am currently awaiting a determination from the pts plan. I did advise her that I will contact her with the determination when available. She thanked me for calling her with update.

## 2020-05-17 NOTE — Telephone Encounter (Signed)
I checked with [facility] and they did not use information for free month.  They have now applied this and first month will be free.  Pharmacy will get medication ready. Our pharmacist has looked into insurance coverage and patient will qualify for $10 co pay card.  This will be given to him at office visit on 4/21. I spoke with patient's daughter and gave her updated information.  Daughter is aware patient will need to stop Ramipril for 36 hours prior to starting Entresto.  He will follow up with pharmacist in office as planned on 4/21

## 2020-05-18 NOTE — Telephone Encounter (Addendum)
**Note De-Identified Catherina Pates Obfuscation** Entresto approval letter received from [facility] Hiller fax. Approval is valid until 05/17/2020.

## 2020-06-03 ENCOUNTER — Ambulatory Visit: Payer: Medicare Other

## 2020-06-11 ENCOUNTER — Other Ambulatory Visit: Payer: Self-pay

## 2020-06-11 MED ORDER — ENTRESTO 49-51 MG PO TABS: 1.0000 | ORAL_TABLET | Freq: Two times a day (BID) | ORAL | 11 refills | Status: DC

## 2020-06-21 ENCOUNTER — Ambulatory Visit (INDEPENDENT_AMBULATORY_CARE_PROVIDER_SITE_OTHER): Payer: Medicare Other | Admitting: Pharmacist

## 2020-06-21 ENCOUNTER — Other Ambulatory Visit: Payer: Self-pay

## 2020-06-21 VITALS — BP 128/64 | HR 62

## 2020-06-21 DIAGNOSIS — I25118 Atherosclerotic heart disease of native coronary artery with other forms of angina pectoris: Secondary | ICD-10-CM | POA: Diagnosis not present

## 2020-06-21 DIAGNOSIS — I420 Dilated cardiomyopathy: Secondary | ICD-10-CM

## 2020-06-21 LAB — BASIC METABOLIC PANEL
BUN/Creatinine Ratio: 11 (ref 10–24)
BUN: 12 mg/dL (ref 8–27)
CO2: 19 mmol/L — ABNORMAL LOW (ref 20–29)
Calcium: 8.9 mg/dL (ref 8.6–10.2)
Chloride: 104 mmol/L (ref 96–106)
Creatinine, Ser: 1.08 mg/dL (ref 0.76–1.27)
Glucose: 90 mg/dL (ref 65–99)
Potassium: 3.9 mmol/L (ref 3.5–5.2)
Sodium: 139 mmol/L (ref 134–144)
eGFR: 70 mL/min/{1.73_m2} (ref >59)

## 2020-06-21 NOTE — Patient Instructions (Addendum)
It was nice meeting you!  I will call you tomorrow with your lab results and discuss any medication changes  Please keep checking your blood pressure at home and please bring those readings with you to your next appointment.

## 2020-06-21 NOTE — Progress Notes (Signed)
Patient ID: Matthew [name]                 DOB: [birth year]                      MRN: [id number]     HPI: Matthew [name] is a 80 y.o. male referred by [name] to pharmacy clinic for HF medication management. PMH is significant for obesity, HTN, CAD, cardiomyopathy, EF improved to 50-55% in 2014 . Most recent LVEF 25-30% on 05/10/20.  Today he presents to pharmacy clinic for further medication titration. At last visit with MD ramipril was stopped and Entresto 49/62m BID was started. Symptomatically, he is feeling fine, denies dizziness, lightheadedness, and fatigue. Denies chest pain or palpitations. No SOB ble to complete all ADLs. He does checks her weight at home (normal range 245 - 248 lbs). Denies LEE, PND, or orthopnea.  His daughter checks his blood pressure at home and does his pill box. He does not know what his BP at home is. Just states that its good. He takes furosemide 5 days a week on the weekdays. He paid $47 for his last fill of Entresto. He has cPharmacist, communitythrough the state health plan.  Of note, patient did arrive significantly early to his appointment and requested to be seen early. This apparently is a habit of the patients. I have asked him to call the office instead of coming here if he needs an earlier appointment time.  Current CHF meds: Entresto 49/590mtwice a day, carvedilol 2552mwice a day, furosemide 36m64mce a day (5 days a week) Previously tried: ramipril (changed to Entresto) BP goal: <130/80  Family History: The patient's family history includes Diabetes in his brother; Hypertension in his father.   Social History: The patient  reports that he has never smoked. He has never used smokeless tobacco. He reports that he does not drink alcohol and does not use drugs.   Diet: not addressed at this visit  Exercise: walks with a [place]  Home BP readings: none available  Wt Readings from Last 3 Encounters:  05/10/20 243 lb 3.2 oz (110.3 kg)   04/22/19 260 lb 12.8 oz (118.3 kg)  04/15/18 267 lb 6.4 oz (121.3 kg)   BP Readings from Last 3 Encounters:  05/10/20 114/62  04/22/19 118/80  04/15/18 118/72   Pulse Readings from Last 3 Encounters:  05/10/20 87  04/22/19 71  04/15/18 89    Renal function: CrCl cannot be calculated (Patient's most recent lab result is older than the maximum 21 days allowed.).  Past Medical History:  Diagnosis Date  . CAD (coronary artery disease)   . Cardiomyopathy ([facility])[place]. Heart failure ([facility])[place]. HTN (hypertension)   . Obesity   . Osteoarthritis     Current Outpatient Medications on File Prior to Visit  Medication Sig Dispense Refill  . amLODipine (NORVASC) 10 MG tablet TAKE 1 TABLET(10 MG) BY MOUTH DAILY. Please keep upcoming appointment in March 2022 for future refills. Thank you 90 tablet 0  . aspirin EC 81 MG tablet Take 1 tablet (81 mg total) by mouth daily. 90 tablet 3  . atorvastatin (LIPITOR) 10 MG tablet TAKE 1 TABLET(10 MG) BY MOUTH DAILY 90 tablet 0  . carvedilol (COREG) 25 MG tablet TAKE 1 TABLET(25 MG) BY MOUTH TWICE DAILY WITH A MEAL 180 tablet 0  . furosemide (LASIX) 40 MG tablet TAKE 1 TABLET BY MOUTH EVERY DAY FOR  5 DAYS A WEEK 90 tablet 2  . sacubitril-valsartan (ENTRESTO) 49-51 MG Take 1 tablet by mouth 2 (two) times daily. 60 tablet 11  . sildenafil (VIAGRA) 100 MG tablet Take 1 tablet (100 mg total) by mouth daily as needed for erectile dysfunction. 10 tablet 3  . vancomycin IVPB Inject 1,000 mg into the vein every 12 (twelve) hours. Indication:  Septic arthritis/ synovial infection Last Day of Therapy:  02/07/17 Labs - Sunday/Monday:  CBC/D, BMP, and vancomycin trough. Labs - Thursday:  BMP and vancomycin trough Labs - Every other week:  ESR and CRP 48 Units 0   No current facility-administered medications on file prior to visit.    No Known Allergies   Assessment/Plan:  1. CHF - Blood pressure adequately controlled today, but room to make medication  adjustments. Will draw BMP today since starting Entresto. Will add spironolactone 12.65m daily if BMP stable. I will call pt tomorrow to finalize medication plan based off labs. Follow up in clinic on 5/26. I have asked patient to write down his blood pressure readings and bring them with him to his next appointment. He was provided a $10/month coupon. He already used the free month coupon.   Thank you,  MRamond Dial Pharm.D, BCPS, CPP [place] [id number] [place] [id number] Phone: [phone] Fax: [phone]

## 2020-06-22 ENCOUNTER — Telehealth: Payer: Self-pay | Admitting: Pharmacist

## 2020-06-22 MED ORDER — SPIRONOLACTONE 25 MG PO TABS: 12.5000 mg | ORAL_TABLET | Freq: Every day | ORAL | 3 refills | Status: DC

## 2020-06-22 NOTE — Telephone Encounter (Signed)
Spoke with patient. He is in agreement to start spironolactone 12.5mg  daily. Rx already sent to [facility] on [address].

## 2020-06-22 NOTE — Telephone Encounter (Signed)
BMP stable. Will plan to add spironolactone 12.5mg  (1/2 tablet) daily. Called pt and LVM to call back f/u scheduled for 5/26 @10 :30

## 2020-07-08 ENCOUNTER — Other Ambulatory Visit: Payer: Self-pay

## 2020-07-08 ENCOUNTER — Ambulatory Visit (INDEPENDENT_AMBULATORY_CARE_PROVIDER_SITE_OTHER): Payer: Medicare Other | Admitting: Pharmacist

## 2020-07-08 VITALS — BP 120/60 | HR 68 | Wt 237.6 lb

## 2020-07-08 DIAGNOSIS — I25118 Atherosclerotic heart disease of native coronary artery with other forms of angina pectoris: Secondary | ICD-10-CM

## 2020-07-08 DIAGNOSIS — I420 Dilated cardiomyopathy: Secondary | ICD-10-CM

## 2020-07-08 LAB — BASIC METABOLIC PANEL
BUN/Creatinine Ratio: 10 (ref 10–24)
BUN: 11 mg/dL (ref 8–27)
CO2: 21 mmol/L (ref 20–29)
Calcium: 9.3 mg/dL (ref 8.6–10.2)
Chloride: 102 mmol/L (ref 96–106)
Creatinine, Ser: 1.12 mg/dL (ref 0.76–1.27)
Glucose: 90 mg/dL (ref 65–99)
Potassium: 3.9 mmol/L (ref 3.5–5.2)
Sodium: 139 mmol/L (ref 134–144)
eGFR: 67 mL/min/{1.73_m2} (ref >59)

## 2020-07-08 NOTE — Progress Notes (Signed)
Patient ID: Matthew [name]                 DOB: [birth year]                      MRN: [id number]     HPI: Matthew [name] is a 80 y.o. male referred by Dr. Eldridge Dace to pharmacy clinic for HF medication management. PMH is significant for obesity, HTN, CAD, cardiomyopathy, EF improved to 50-55% in 2014 . Most recent LVEF 25-30% on 05/10/20.  Today he presents to pharmacy clinic for further medication titration. At last visit with PharmD spironolactone 12.5mg  daily was started. Symptomatically, he is feeling fine, denies dizziness, lightheadedness, and fatigue. Denies chest pain or palpitations. No SOB. Able to complete all ADLs. He does checks his weight at home (normal range 245 - 248 lbs per his report). States his weight has been "about the same". Denies LEE, PND, or orthopnea.   His daughter checks his blood pressure at home and does his pill box.  He brings in 3 blood pressure readings 116/57, 112/60, 111/55. These match about what I got in clinic today of 120/60. Patient asking how long he has to be on his medications.  Current CHF meds: Entresto 49/51mg  twice a day, carvedilol 25mg  twice a day, furosemide 40mg  once a day (5 days a week), spironolactone 12.5mg  daily Previously tried: ramipril (changed to Entresto) BP goal: <130/80  Family History: The patient's family history includes Diabetes in his brother; Hypertension in his father.   Social History: The patient  reports that he has never smoked. He has never used smokeless tobacco. He reports that he does not drink alcohol and does not use drugs.   Diet:does eat breakfast Lunch: grilled chicken, green beans, pinto beans, corn bread Dinner: hamburger from [facility] (once a week), rice, oatmeal, lima beans  Exercise: walks with a [place]- walks for about 30 min around the house, arm raises, leg raises  Home BP readings: 116/57, 112/60, 111/55  Wt Readings from Last 3 Encounters:  05/10/20 243 lb 3.2 oz (110.3 kg)  04/22/19 260 lb  12.8 oz (118.3 kg)  04/15/18 267 lb 6.4 oz (121.3 kg)   BP Readings from Last 3 Encounters:  06/21/20 128/64  05/10/20 114/62  04/22/19 118/80   Pulse Readings from Last 3 Encounters:  06/21/20 62  05/10/20 87  04/22/19 71    Renal function: CrCl cannot be calculated (Unknown ideal weight.).  Past Medical History:  Diagnosis Date  . CAD (coronary artery disease)   . Cardiomyopathy (HCC)   . Heart failure (H[facility])   . HTN (hypertension)   . Obesity   . Osteoarthritis     Current Outpatient Medications on File Prior to Visit  Medication Sig Dispense Refill  . amLODipine (NORVASC) 10 MG tablet TAKE 1 TABLET(10 MG) BY MOUTH DAILY. Please keep upcoming appointment in March 2022 for future refills. Thank you 90 tablet 0  . aspirin EC 81 MG tablet Take 1 tablet (81 mg total) by mouth daily. 90 tablet 3  . atorvastatin (LIPITOR) 10 MG tablet TAKE 1 TABLET(10 MG) BY MOUTH DAILY 90 tablet 0  . carvedilol (COREG) 25 MG tablet TAKE 1 TABLET(25 MG) BY MOUTH TWICE DAILY WITH A MEAL 180 tablet 0  . furosemide (LASIX) 40 MG tablet TAKE 1 TABLET BY MOUTH EVERY DAY FOR 5 DAYS A WEEK 90 tablet 2  . sacubitril-valsartan (ENTRESTO) 49-51 MG Take 1 tablet by mouth 2 (two) times daily. 60  tablet 11  . sildenafil (VIAGRA) 100 MG tablet Take 1 tablet (100 mg total) by mouth daily as needed for erectile dysfunction. 10 tablet 3  . spironolactone (ALDACTONE) 25 MG tablet Take 0.5 tablets (12.5 mg total) by mouth daily. 45 tablet 3   No current facility-administered medications on file prior to visit.    No Known Allergies   Assessment/Plan:  1. CHF - Blood pressure adequately controlled today. Will draw BMP today since starting spironolactone. If stable will consider increasing either spironolactone or Entresto. I will call pt tomorrow to finalize medication plan based off labs. Follow up in clinic on 6/17. I have asked patient to continue to write down his blood pressure readings and bring them with  him to his next appointment. Weight in clinic is down significantly from weight in March. I explained to patient that his medications are helping his heart work more efficiently, therefore we need to continue them long term. He verbalized understanding.   Thank you,  Olene Floss, Pharm.D, BCPS, CPP [facility]  [address] [place], [place] [address]  Phone: [phone]; Fax: [phone]

## 2020-07-08 NOTE — Patient Instructions (Addendum)
Keep checking your blood pressure and weight at home Write down your readings  Continue Entresto 49/51mg  twice a day, carvedilol 25mg  twice a day, furosemide 40mg  once a day (5 days a week), and spironolactone 12.5mg  daily  I will call you tomorrow with your lab results  Call me at [phone] with any questions

## 2020-07-09 ENCOUNTER — Telehealth: Payer: Self-pay | Admitting: Pharmacist

## 2020-07-09 MED ORDER — DAPAGLIFLOZIN PROPANEDIOL 10 MG PO TABS: 10.0000 mg | ORAL_TABLET | Freq: Every day | ORAL | 3 refills | Status: DC

## 2020-07-09 NOTE — Telephone Encounter (Signed)
Called and spoke with patient about his labs. Will start Farxiga 10mg  daily. I have called rx to pharmacy. Patient does not have an email address to get copay card. He requested I call his daughter and ask for her email and to inform her of his medication changes. I called and LVM for her to call back I would like patient to decrease his furosemide to 20mg , 5 days a week to avoid over diuresis with [name].

## 2020-07-09 NOTE — Telephone Encounter (Signed)
Patient's daughter called back. Gave her the information about medication changes, Was able to get copay card  [facility] GRP: [id number] ID [id number]  Called info into [facility]. Copay now $0

## 2020-07-17 ENCOUNTER — Other Ambulatory Visit: Payer: Self-pay | Admitting: Interventional Cardiology

## 2020-07-30 ENCOUNTER — Ambulatory Visit (INDEPENDENT_AMBULATORY_CARE_PROVIDER_SITE_OTHER): Payer: Medicare Other | Admitting: Pharmacist

## 2020-07-30 ENCOUNTER — Other Ambulatory Visit: Payer: Self-pay

## 2020-07-30 VITALS — BP 112/58 | HR 54 | Wt 237.2 lb

## 2020-07-30 DIAGNOSIS — I420 Dilated cardiomyopathy: Secondary | ICD-10-CM

## 2020-07-30 LAB — BASIC METABOLIC PANEL
BUN/Creatinine Ratio: 8 — ABNORMAL LOW (ref 10–24)
BUN: 9 mg/dL (ref 8–27)
CO2: 21 mmol/L (ref 20–29)
Calcium: 9.4 mg/dL (ref 8.6–10.2)
Chloride: 104 mmol/L (ref 96–106)
Creatinine, Ser: 1.18 mg/dL (ref 0.76–1.27)
Glucose: 84 mg/dL (ref 65–99)
Potassium: 4.2 mmol/L (ref 3.5–5.2)
Sodium: 140 mmol/L (ref 134–144)
eGFR: 62 mL/min/{1.73_m2} (ref >59)

## 2020-07-30 NOTE — Progress Notes (Signed)
Patient ID: Matthew [name]                 DOB: Feb 12, 1941                      MRN: [id number]     HPI: Matthew [name] is a 80 y.o. male referred by Dr. Eldridge Dace to pharmacy clinic for HF medication management. PMH is significant for obesity, HTN, CAD, cardiomyopathy, EF improved to 50-55% in 2014 . Most recent LVEF 25-30% on 05/10/20.  Today he presents to pharmacy clinic for further medication titration. At last visit with PharmD Marcelline Deist 10mg  daily was added. Symptomatically, he is feeling good, denies dizziness, lightheadedness, and fatigue. Denies chest pain or palpitations. No SOB. Able to complete all ADLs. He does checks his weight at home (normal range 237 lbs per his report). Weight is the same today as last visit. States his weight has been "about the same". Denies LEE, PND, or orthopnea. On exam, very mild edema in shin. He brings in 3 home blood pressures which are similar to clinic reading. His daughter helps him with meds and he requested I can with any changes. States he lost some weight and feels much better.  Current CHF meds: Entresto 49/51mg  twice a day, carvedilol 25mg  twice a day, furosemide 20mg  once a day (5 days a week), spironolactone 12.5mg  daily, Farxiga 10mg  daily Previously tried: ramipril (changed to [name]) BP goal: <130/80  Family History: The patient's family history includes Diabetes in his brother; Hypertension in his father.   Social History: The patient  reports that he has never smoked. He has never used smokeless tobacco. He reports that he does not drink alcohol and does not use drugs.   Diet:does eat breakfast Lunch: grilled chicken, green beans, pinto beans, corn bread Dinner: hamburger from (once a week), rice, oatmeal, lima beans  Exercise: walks with a [place]- walks for about 30 min around the house, arm raises, leg raises  Home BP readings: 111/54, 100/61, 100/57  Wt Readings from Last 3 Encounters:  07/08/20 237 lb 9.6 oz  (107.8 kg)  05/10/20 243 lb 3.2 oz (110.3 kg)  04/22/19 260 lb 12.8 oz (118.3 kg)   BP Readings from Last 3 Encounters:  07/08/20 120/60  06/21/20 128/64  05/10/20 114/62   Pulse Readings from Last 3 Encounters:  07/08/20 68  06/21/20 62  05/10/20 87    Renal function: CrCl cannot be calculated (Patient's most recent lab result is older than the maximum 21 days allowed.).  Past Medical History:  Diagnosis Date   CAD (coronary artery disease)    Cardiomyopathy (HCC)    Heart failure (HCC)    HTN (hypertension)    Obesity    Osteoarthritis     Current Outpatient Medications on File Prior to Visit  Medication Sig Dispense Refill   amLODipine (NORVASC) 10 MG tablet Take 1 tablet (10 mg total) by mouth daily. 90 tablet 3   aspirin EC 81 MG tablet Take 1 tablet (81 mg total) by mouth daily. 90 tablet 3   atorvastatin (LIPITOR) 10 MG tablet TAKE 1 TABLET(10 MG) BY MOUTH DAILY 90 tablet 3   carvedilol (COREG) 25 MG tablet TAKE 1 TABLET(25 MG) BY MOUTH TWICE DAILY WITH A MEAL 180 tablet 0   dapagliflozin propanediol (FARXIGA) 10 MG TABS tablet Take 1 tablet (10 mg total) by mouth daily. 90 tablet 3   furosemide (LASIX) 40 MG tablet Take 1/2 tablet by mouth  Monday- Friday 90 tablet 2   sacubitril-valsartan (ENTRESTO) 49-51 MG Take 1 tablet by mouth 2 (two) times daily. 60 tablet 11   sildenafil (VIAGRA) 100 MG tablet Take 1 tablet (100 mg total) by mouth daily as needed for erectile dysfunction. 10 tablet 3   spironolactone (ALDACTONE) 25 MG tablet Take 0.5 tablets (12.5 mg total) by mouth daily. 45 tablet 3   No current facility-administered medications on file prior to visit.    No Known Allergies   Assessment/Plan:  1. CHF - Blood pressure adequately controlled today. Will draw BMP today since starting Farxiga. Expect to see a mild bump in scr. If stable will consider increasing increasing Entresto. Will decrease amlodipine to 5mg  daily to allow more blood pressure room. I  will call pt on Monday to finalize medication plan based off labs. Continue carvedilol 25mg  twice a day, furosemide 20mg  once a day (5 days a week), spironolactone 12.5mg  daily and Farxiga 10mg  daily. Follow up in clinic on 7/5. I have asked patient to continue to write down his blood pressure readings and bring them with him to his next appointment.   Thank you,  , Pharm.D, BCPS, CPP [facility]  [address], [place], [address]/5 Olene Floss  Phone: [phone]; Fax: [phone]

## 2020-07-30 NOTE — Patient Instructions (Signed)
It was nice to see you!  I will call you on Monday with your lab results. The plan will be to decrease your amlodipine to 5mg  (1/2 tablet) daily and increase the Entresto to 97/103mg  twice a day. I will call Monday to confirm this change  Please continue

## 2020-08-02 ENCOUNTER — Telehealth: Payer: Self-pay | Admitting: Pharmacist

## 2020-08-02 MED ORDER — AMLODIPINE BESYLATE 10 MG PO TABS: 5.0000 mg | ORAL_TABLET | Freq: Every day | ORAL | 3 refills | Status: DC

## 2020-08-02 MED ORDER — ENTRESTO 97-103 MG PO TABS: 1.0000 | ORAL_TABLET | Freq: Two times a day (BID) | ORAL | 3 refills | Status: DC

## 2020-08-02 NOTE — Telephone Encounter (Signed)
Spoke with daughter and reviewed instructions below. Spoke with patient and reviewed changes

## 2020-08-02 NOTE — Telephone Encounter (Signed)
BMP stable. Decrease amlodipine to 5mg  daily and increase Entresto to 97/103mg  twice a day. Left VM for pt daughter, , to review changes.

## 2020-08-17 ENCOUNTER — Ambulatory Visit (INDEPENDENT_AMBULATORY_CARE_PROVIDER_SITE_OTHER): Payer: Medicare Other | Admitting: Pharmacist

## 2020-08-17 ENCOUNTER — Other Ambulatory Visit: Payer: Self-pay

## 2020-08-17 VITALS — BP 118/50 | HR 50 | Wt 236.8 lb

## 2020-08-17 DIAGNOSIS — I420 Dilated cardiomyopathy: Secondary | ICD-10-CM | POA: Diagnosis not present

## 2020-08-17 DIAGNOSIS — I25118 Atherosclerotic heart disease of native coronary artery with other forms of angina pectoris: Secondary | ICD-10-CM

## 2020-08-17 LAB — BASIC METABOLIC PANEL
BUN/Creatinine Ratio: 12 (ref 10–24)
BUN: 13 mg/dL (ref 8–27)
CO2: 22 mmol/L (ref 20–29)
Calcium: 9 mg/dL (ref 8.6–10.2)
Chloride: 106 mmol/L (ref 96–106)
Creatinine, Ser: 1.13 mg/dL (ref 0.76–1.27)
Glucose: 88 mg/dL (ref 65–99)
Potassium: 4.4 mmol/L (ref 3.5–5.2)
Sodium: 140 mmol/L (ref 134–144)
eGFR: 66 mL/min/{1.73_m2} (ref >59)

## 2020-08-17 NOTE — Patient Instructions (Signed)
Its always nice to see you  Please keep checking your blood pressure at home. Please also check your heart rate. Your blood pressure machine should also tell you this number.  I will call you tomorrow with your lab results. If everything looks good, we will plan to increase spironolactone to 25mg  daily and stop amlodipine. Ill call you tomorrow to confirm  Call me at [phone] with any questions

## 2020-08-17 NOTE — Progress Notes (Addendum)
Patient ID: Matthew [name]                 DOB: May 24, 1940                      MRN: [id number]     HPI: Matthew [name] is a 80 y.o. male referred by Dr. Eldridge Dace to pharmacy clinic for HF medication management. PMH is significant for obesity, HTN, CAD, cardiomyopathy, EF improved to 50-55% in 2014 . Most recent LVEF 25-30% on 05/10/20.  Today he presents to pharmacy clinic for further medication titration. At last visit with PharmD Sherryll Burger was increased to 97/103mg  twice a day. Symptomatically, he is feeling good, denies dizziness, lightheadedness, and fatigue. Denies chest pain or palpitations. No SOB. Able to complete all ADLs. He does checks his weight at home (normal range 237 lbs per his report). Weight is down about a pound from last visit. Denies LEE, PND, or orthopnea. He brings in 3 home blood pressures ranging from 103-123 systolic. His daughter helps him with meds and he requested I call [name] with any changes. He had a bottle with a ramipril label in his medication bag, but per [name] that is an old bottle and what's in it is spironolactone. Called [place] during visit to confirm medication changes. States he lost some weight (about 60lb) and feels much better.  Current CHF meds: Entresto 97/103mg  twice a day, carvedilol 25mg  twice a day, furosemide 20mg  once a day (5 days a week), spironolactone 12.5mg  daily, Farxiga 10mg  daily HTN meds: amlodipine 5mg  Previously tried: ramipril (changed to ) BP goal: <130/80  Family History: The patient's family history includes Diabetes in his brother; Hypertension in his father.   Social History: The patient  reports that he has never smoked. He has never used smokeless tobacco. He reports that he does not drink alcohol and does not use drugs.   Diet:does eat breakfast Lunch: grilled chicken, green beans, pinto beans, corn bread Dinner: hamburger from [facility] (once a week), rice, oatmeal, lima beans  Exercise: walks with a [place]-  walks for about 30 min around the house, arm raises, leg raises  Home BP readings: 111/63, 108/70, 123/78  Wt Readings from Last 3 Encounters:  07/30/20 237 lb 3.2 oz (107.6 kg)  07/08/20 237 lb 9.6 oz (107.8 kg)  05/10/20 243 lb 3.2 oz (110.3 kg)   BP Readings from Last 3 Encounters:  07/30/20 (!) 112/58  07/08/20 120/60  06/21/20 128/64   Pulse Readings from Last 3 Encounters:  07/30/20 (!) 54  07/08/20 68  06/21/20 62    Renal function: CrCl cannot be calculated (Unknown ideal weight.).  Past Medical History:  Diagnosis Date   CAD (coronary artery disease)    Cardiomyopathy (HCC)    Heart failure (HCC)    HTN (hypertension)    Obesity    Osteoarthritis     Current Outpatient Medications on File Prior to Visit  Medication Sig Dispense Refill   amLODipine (NORVASC) 10 MG tablet Take 0.5 tablets (5 mg total) by mouth daily. 90 tablet 3   aspirin EC 81 MG tablet Take 1 tablet (81 mg total) by mouth daily. 90 tablet 3   atorvastatin (LIPITOR) 10 MG tablet TAKE 1 TABLET(10 MG) BY MOUTH DAILY 90 tablet 3   carvedilol (COREG) 25 MG tablet TAKE 1 TABLET(25 MG) BY MOUTH TWICE DAILY WITH A MEAL 180 tablet 0   dapagliflozin propanediol (FARXIGA) 10 MG TABS tablet Take 1 tablet (10 mg  total) by mouth daily. 90 tablet 3   furosemide (LASIX) 40 MG tablet Take 1/2 tablet by mouth Monday- Friday 90 tablet 2   sacubitril-valsartan (ENTRESTO) 97-103 MG Take 1 tablet by mouth 2 (two) times daily. 180 tablet 3   sildenafil (VIAGRA) 100 MG tablet Take 1 tablet (100 mg total) by mouth daily as needed for erectile dysfunction. 10 tablet 3   spironolactone (ALDACTONE) 25 MG tablet Take 0.5 tablets (12.5 mg total) by mouth daily. 45 tablet 3   No current facility-administered medications on file prior to visit.    No Known Allergies   Assessment/Plan:  1. CHF - Blood pressure adequately controlled today at goal of <130/80. Will draw BMP today since increasing Entresto. If stable will  plan to increase spironolactone to 25mg  daily and stop amlodipine. I will call pt and daughter tomorrow to finalize medication plan based off labs. Continue Entresto 97/103mg  BID, carvedilol 25mg  twice a day, furosemide 20mg  once a day (5 days a week), spironolactone 12.5mg  daily and Farxiga 10mg  daily for now. Follow up in clinic on 7/21. I have asked patient to continue to write down his blood pressure readings and bring them with him to his next appointment. I have also asked him to track his HR. HR in clinic today 45 then 50 on repeat.  Thank you,  , Pharm.D, BCPS, CPP [facility]  [address], [place], [address] Olene Floss  Phone: [phone]; Fax: [phone]

## 2020-08-18 ENCOUNTER — Telehealth: Payer: Self-pay | Admitting: Pharmacist

## 2020-08-18 MED ORDER — SPIRONOLACTONE 25 MG PO TABS: 25.0000 mg | ORAL_TABLET | Freq: Every day | ORAL | 3 refills | Status: DC

## 2020-08-18 NOTE — Telephone Encounter (Signed)
BMP stable. Will stop amlodipine and increase spironolactone to target dose of 25mg  daily. Rx sent to pharmacy. Pt daughter and patient made aware of changes.

## 2020-08-23 ENCOUNTER — Other Ambulatory Visit: Payer: Self-pay

## 2020-08-23 MED ORDER — CARVEDILOL 25 MG PO TABS: ORAL_TABLET | ORAL | 2 refills | Status: DC

## 2020-08-23 NOTE — Telephone Encounter (Signed)
Pt's medication was sent to pt's pharmacy as requested. Confirmation received.  °

## 2020-09-02 ENCOUNTER — Other Ambulatory Visit: Payer: Self-pay

## 2020-09-02 ENCOUNTER — Ambulatory Visit (INDEPENDENT_AMBULATORY_CARE_PROVIDER_SITE_OTHER): Payer: Medicare Other | Admitting: Pharmacist

## 2020-09-02 VITALS — BP 128/62 | HR 51 | Ht 78.0 in | Wt 231.0 lb

## 2020-09-02 DIAGNOSIS — I25118 Atherosclerotic heart disease of native coronary artery with other forms of angina pectoris: Secondary | ICD-10-CM

## 2020-09-02 DIAGNOSIS — I420 Dilated cardiomyopathy: Secondary | ICD-10-CM

## 2020-09-02 DIAGNOSIS — I10 Essential (primary) hypertension: Secondary | ICD-10-CM | POA: Diagnosis not present

## 2020-09-02 LAB — BASIC METABOLIC PANEL
BUN/Creatinine Ratio: 6 — ABNORMAL LOW (ref 10–24)
BUN: 7 mg/dL — ABNORMAL LOW (ref 8–27)
CO2: 20 mmol/L (ref 20–29)
Calcium: 8.9 mg/dL (ref 8.6–10.2)
Chloride: 107 mmol/L — ABNORMAL HIGH (ref 96–106)
Creatinine, Ser: 1.1 mg/dL (ref 0.76–1.27)
Glucose: 84 mg/dL (ref 65–99)
Potassium: 3.8 mmol/L (ref 3.5–5.2)
Sodium: 143 mmol/L (ref 134–144)
eGFR: 68 mL/min/{1.73_m2} (ref >59)

## 2020-09-02 NOTE — Progress Notes (Signed)
Patient ID: Matthew [name]                 DOB: Mar 23, 1940                      MRN: [id number]     HPI: Matthew [name] is a 80 y.o. male referred by Dr. Eldridge Dace to pharmacy clinic for HF medication management. PMH is significant for obesity, HTN, CAD, cardiomyopathy, EF improved to 50-55% in 2014 . Most recent LVEF 25-30% on 05/10/20.  Today he presents to pharmacy clinic for further medication titration. At last visit with PharmD spironolactone was increased to 25mg  daily and amlodipine was stopped. Symptomatically, he is feeling good, denies dizziness, lightheadedness, and fatigue. No SOB. Able to complete all ADLs. Walks with a [employer] for balance. Weight at home is stable per report. Weight is down about 5 pounds from last visit. Denies LEE, PND, or orthopnea.   Current CHF meds: Entresto 97/103mg  twice a day, carvedilol 25mg  twice a day, furosemide 20mg  once a day (5 days a week), spironolactone 25mg  daily, Farxiga 10mg  daily Previously tried: ramipril (changed to ) BP goal: <130/80  Family History: The patient's family history includes Diabetes in his brother; Hypertension in his father.   Social History: The patient  reports that he has never smoked. He has never used smokeless tobacco. He reports that he does not drink alcohol and does not use drugs.   Diet:does eat breakfast Lunch: grilled chicken, green beans, pinto beans, corn bread Dinner: hamburger from [facility] (once a week), rice, oatmeal, lima beans  Exercise: walks with a [place]- walks for about 30 min around the house, arm raises, leg raises  Home BP readings: 110/72, 118/67 HR 60, 63  Wt Readings from Last 3 Encounters:  08/17/20 236 lb 12.8 oz (107.4 kg)  07/30/20 237 lb 3.2 oz (107.6 kg)  07/08/20 237 lb 9.6 oz (107.8 kg)   BP Readings from Last 3 Encounters:  08/17/20 (!) 118/50  07/30/20 (!) 112/58  07/08/20 120/60   Pulse Readings from Last 3 Encounters:  08/17/20 (!) 50  07/30/20 (!) 54   07/08/20 68    Renal function: CrCl cannot be calculated (Unknown ideal weight.).  Past Medical History:  Diagnosis Date   CAD (coronary artery disease)    Cardiomyopathy (HCC)    Heart failure (HCC)    HTN (hypertension)    Obesity    Osteoarthritis     Current Outpatient Medications on File Prior to Visit  Medication Sig Dispense Refill   aspirin EC 81 MG tablet Take 1 tablet (81 mg total) by mouth daily. 90 tablet 3   atorvastatin (LIPITOR) 10 MG tablet TAKE 1 TABLET(10 MG) BY MOUTH DAILY 90 tablet 3   carvedilol (COREG) 25 MG tablet TAKE 1 TABLET(25 MG) BY MOUTH TWICE DAILY WITH A MEAL 180 tablet 2   dapagliflozin propanediol (FARXIGA) 10 MG TABS tablet Take 1 tablet (10 mg total) by mouth daily. 90 tablet 3   furosemide (LASIX) 40 MG tablet Take 1/2 tablet by mouth Monday- Friday 90 tablet 2   sacubitril-valsartan (ENTRESTO) 97-103 MG Take 1 tablet by mouth 2 (two) times daily. 180 tablet 3   sildenafil (VIAGRA) 100 MG tablet Take 1 tablet (100 mg total) by mouth daily as needed for erectile dysfunction. 10 tablet 3   spironolactone (ALDACTONE) 25 MG tablet Take 1 tablet (25 mg total) by mouth daily. 90 tablet 3   No current facility-administered medications on file  prior to visit.    No Known Allergies   Assessment/Plan:  1. CHF - Blood pressure adequately controlled today at goal of <130/80. Will draw BMP today since increasing spironolactone. BP at home is at goal as well (2 readings provided). HR in the 60's. Patient is on optimal GDMT and is on target doses. No medication changes. Continue Entresto 97/103mg  twice a day, carvedilol 25mg  twice a day, furosemide 20mg  once a day (5 days a week), spironolactone 25mg  daily, Farxiga 10mg  daily. Follow up with lab work. Patient to follow up with me as needed. Due to see Dr. in March.   Thank you,  , Pharm.D, BCPS, CPP [facility]  [address] [place], [place] Olene Floss   Phone: [phone]; Fax: [phone]

## 2020-09-02 NOTE — Patient Instructions (Addendum)
Continue checking your blood pressure at home.  Call me if it is consistently >130/80  Continue taking  Entresto 97/103mg  twice a day, carvedilol 25mg  twice a day, furosemide 20mg  once a day (5 days a week), spironolactone 25mg  daily and Farxiga 10mg  daily  Call me at [phone] with any questions  Follow up with Dr. in March

## 2020-11-26 ENCOUNTER — Telehealth: Payer: Self-pay | Admitting: Interventional Cardiology

## 2020-11-26 NOTE — Telephone Encounter (Signed)
Pt c/o medication issue:  1. Name of Medication: PT'S DAUGHTER IS NOT SURE OF WHICH MEDS TO GIVE PT  2. How are you currently taking this medication (dosage and times per day)?   3. Are you having a reaction (difficulty breathing--STAT)? NO  4. What is your medication issue? PT'S DAUGHTER IS CALLING REGARDING THE PT'S MEDS. DAUGHTER (DPR) STATES SHE WAS SUPPOSED TO STOP A FEW OF THE PT'S MEDS BUT SHE IS UNSURE OF WHICH ONES

## 2020-11-26 NOTE — Telephone Encounter (Signed)
Pts daughter was calling to confirm the pts medication list, to make sure it is matching what she is providing the pt.   Endorsed to the pts daughter [name] (on [place]), the pts most current med list and last OV note the pt had with Malena Peer RHP in BP clinic on 7/21 (see instructions Melissa provided pt at 7/21 OV below).   Daughter confirmed that her list and what she's giving to the pt matches up with what we have on file.   Daughter also asked if pt has mild swelling to his feet/ankles, what should she tell the pt to do for this.  She states occasionally he experiences this, but has no other cardiac symptoms.   Educated the pts daughter about pt needing to wear compressions during the day, elevate his lower extremities while at rest, and reduce the salt in his diet.  Also advised her about dry weighing, with what parameters (wt gain of 3 lbs in 24 hr time period or 5lbs in a week)  meet notification to contact our office immediately about this.    Daughter asked if a copy of the pts current med list be mailed to them at the confirmed address on file.  Will send this out to be mailed today.  Daughter verbalized understanding and agrees with this plan. Daughter was more than gracious for all the assistance provided.    BP CLINIC/PHARMACIST OV NOTE/INSTRUCTIONS PT HAD ON 7/21: Instructions  Continue checking your blood pressure at home.   Call me if it is consistently >130/80   Continue taking  Entresto 97/103mg  twice a day, carvedilol 25mg  twice a day, furosemide 20mg  once a day (5 days a week), spironolactone 25mg  daily and Farxiga 10mg  daily   Call me at [phone] with any questions   Follow up with Dr. in March

## 2021-04-04 ENCOUNTER — Inpatient Hospital Stay (HOSPITAL_COMMUNITY): Payer: Medicare PPO

## 2021-04-04 ENCOUNTER — Emergency Department (HOSPITAL_COMMUNITY): Payer: Medicare PPO

## 2021-04-04 ENCOUNTER — Encounter (HOSPITAL_COMMUNITY): Payer: Self-pay

## 2021-04-04 ENCOUNTER — Other Ambulatory Visit: Payer: Self-pay

## 2021-04-04 ENCOUNTER — Inpatient Hospital Stay (HOSPITAL_COMMUNITY)
Admission: EM | Admit: 2021-04-04 | Discharge: 2021-04-08 | DRG: 291 | Disposition: A | Discharge status: Home Health | Payer: Medicare PPO | Attending: Family Medicine | Admitting: Family Medicine

## 2021-04-04 DIAGNOSIS — N179 Acute kidney failure, unspecified: Secondary | ICD-10-CM | POA: Diagnosis present

## 2021-04-04 DIAGNOSIS — I340 Nonrheumatic mitral (valve) insufficiency: Secondary | ICD-10-CM | POA: Diagnosis not present

## 2021-04-04 DIAGNOSIS — I10 Essential (primary) hypertension: Secondary | ICD-10-CM | POA: Diagnosis present

## 2021-04-04 DIAGNOSIS — T502X5A Adverse effect of carbonic-anhydrase inhibitors, benzothiadiazides and other diuretics, initial encounter: Secondary | ICD-10-CM | POA: Diagnosis not present

## 2021-04-04 DIAGNOSIS — Z8249 Family history of ischemic heart disease and other diseases of the circulatory system: Secondary | ICD-10-CM | POA: Diagnosis not present

## 2021-04-04 DIAGNOSIS — D509 Iron deficiency anemia, unspecified: Secondary | ICD-10-CM | POA: Diagnosis present

## 2021-04-04 DIAGNOSIS — I110 Hypertensive heart disease with heart failure: Secondary | ICD-10-CM | POA: Diagnosis present

## 2021-04-04 DIAGNOSIS — T50996A Underdosing of other drugs, medicaments and biological substances, initial encounter: Secondary | ICD-10-CM | POA: Diagnosis present

## 2021-04-04 DIAGNOSIS — I420 Dilated cardiomyopathy: Secondary | ICD-10-CM | POA: Diagnosis present

## 2021-04-04 DIAGNOSIS — Z20822 Contact with and (suspected) exposure to covid-19: Secondary | ICD-10-CM | POA: Diagnosis present

## 2021-04-04 DIAGNOSIS — Y92009 Unspecified place in unspecified non-institutional (private) residence as the place of occurrence of the external cause: Secondary | ICD-10-CM | POA: Diagnosis not present

## 2021-04-04 DIAGNOSIS — I509 Heart failure, unspecified: Secondary | ICD-10-CM | POA: Diagnosis present

## 2021-04-04 DIAGNOSIS — E669 Obesity, unspecified: Secondary | ICD-10-CM | POA: Diagnosis present

## 2021-04-04 DIAGNOSIS — I081 Rheumatic disorders of both mitral and tricuspid valves: Secondary | ICD-10-CM | POA: Diagnosis present

## 2021-04-04 DIAGNOSIS — D649 Anemia, unspecified: Secondary | ICD-10-CM | POA: Diagnosis present

## 2021-04-04 DIAGNOSIS — E739 Lactose intolerance, unspecified: Secondary | ICD-10-CM | POA: Diagnosis present

## 2021-04-04 DIAGNOSIS — Z833 Family history of diabetes mellitus: Secondary | ICD-10-CM | POA: Diagnosis not present

## 2021-04-04 DIAGNOSIS — Z7982 Long term (current) use of aspirin: Secondary | ICD-10-CM | POA: Diagnosis not present

## 2021-04-04 DIAGNOSIS — Z6828 Body mass index (BMI) 28.0-28.9, adult: Secondary | ICD-10-CM

## 2021-04-04 DIAGNOSIS — I493 Ventricular premature depolarization: Secondary | ICD-10-CM | POA: Diagnosis present

## 2021-04-04 DIAGNOSIS — D638 Anemia in other chronic diseases classified elsewhere: Secondary | ICD-10-CM | POA: Diagnosis present

## 2021-04-04 DIAGNOSIS — I2510 Atherosclerotic heart disease of native coronary artery without angina pectoris: Secondary | ICD-10-CM | POA: Diagnosis present

## 2021-04-04 DIAGNOSIS — R008 Other abnormalities of heart beat: Secondary | ICD-10-CM | POA: Diagnosis present

## 2021-04-04 DIAGNOSIS — L602 Onychogryphosis: Secondary | ICD-10-CM | POA: Diagnosis present

## 2021-04-04 DIAGNOSIS — B351 Tinea unguium: Secondary | ICD-10-CM | POA: Diagnosis present

## 2021-04-04 DIAGNOSIS — N390 Urinary tract infection, site not specified: Secondary | ICD-10-CM | POA: Diagnosis not present

## 2021-04-04 DIAGNOSIS — E782 Mixed hyperlipidemia: Secondary | ICD-10-CM | POA: Diagnosis present

## 2021-04-04 DIAGNOSIS — R42 Dizziness and giddiness: Secondary | ICD-10-CM | POA: Diagnosis present

## 2021-04-04 DIAGNOSIS — Z79899 Other long term (current) drug therapy: Secondary | ICD-10-CM

## 2021-04-04 DIAGNOSIS — Y92239 Unspecified place in hospital as the place of occurrence of the external cause: Secondary | ICD-10-CM | POA: Diagnosis not present

## 2021-04-04 DIAGNOSIS — I5023 Acute on chronic systolic (congestive) heart failure: Secondary | ICD-10-CM | POA: Diagnosis present

## 2021-04-04 DIAGNOSIS — M170 Bilateral primary osteoarthritis of knee: Secondary | ICD-10-CM | POA: Diagnosis present

## 2021-04-04 DIAGNOSIS — Z91128 Patient's intentional underdosing of medication regimen for other reason: Secondary | ICD-10-CM

## 2021-04-04 DIAGNOSIS — I43 Cardiomyopathy in diseases classified elsewhere: Secondary | ICD-10-CM | POA: Diagnosis present

## 2021-04-04 DIAGNOSIS — I952 Hypotension due to drugs: Secondary | ICD-10-CM | POA: Diagnosis not present

## 2021-04-04 DIAGNOSIS — I452 Bifascicular block: Secondary | ICD-10-CM | POA: Diagnosis present

## 2021-04-04 LAB — CBC WITH DIFFERENTIAL/PLATELET
Abs Immature Granulocytes: 0.02 10*3/uL (ref 0.00–0.07)
Basophils Absolute: 0 10*3/uL (ref 0.0–0.1)
Basophils Relative: 0 %
Eosinophils Absolute: 0.1 10*3/uL (ref 0.0–0.5)
Eosinophils Relative: 1 %
HCT: 36.9 % — ABNORMAL LOW (ref 39.0–52.0)
Hemoglobin: 11.7 g/dL — ABNORMAL LOW (ref 13.0–17.0)
Immature Granulocytes: 0 %
Lymphocytes Relative: 19 %
Lymphs Abs: 1.1 10*3/uL (ref 0.7–4.0)
MCH: 24.9 pg — ABNORMAL LOW (ref 26.0–34.0)
MCHC: 31.7 g/dL (ref 30.0–36.0)
MCV: 78.7 fL — ABNORMAL LOW (ref 80.0–100.0)
Monocytes Absolute: 0.6 10*3/uL (ref 0.1–1.0)
Monocytes Relative: 11 %
Neutro Abs: 4 10*3/uL (ref 1.7–7.7)
Neutrophils Relative %: 69 %
Platelets: 116 10*3/uL — ABNORMAL LOW (ref 150–400)
RBC: 4.69 MIL/uL (ref 4.22–5.81)
RDW: 19.8 % — ABNORMAL HIGH (ref 11.5–15.5)
WBC: 5.7 10*3/uL (ref 4.0–10.5)
nRBC: 0 % (ref 0.0–0.2)

## 2021-04-04 LAB — COMPREHENSIVE METABOLIC PANEL
ALT: 15 U/L (ref 0–44)
AST: 20 U/L (ref 15–41)
Albumin: 3 g/dL — ABNORMAL LOW (ref 3.5–5.0)
Alkaline Phosphatase: 95 U/L (ref 38–126)
Anion gap: 10 (ref 5–15)
BUN: 23 mg/dL (ref 8–23)
CO2: 19 mmol/L — ABNORMAL LOW (ref 22–32)
Calcium: 8.7 mg/dL — ABNORMAL LOW (ref 8.9–10.3)
Chloride: 107 mmol/L (ref 98–111)
Creatinine, Ser: 1.6 mg/dL — ABNORMAL HIGH (ref 0.61–1.24)
GFR, Estimated: 43 mL/min — ABNORMAL LOW (ref >60)
Glucose, Bld: 88 mg/dL (ref 70–99)
Potassium: 3.8 mmol/L (ref 3.5–5.1)
Sodium: 136 mmol/L (ref 135–145)
Total Bilirubin: 0.9 mg/dL (ref 0.3–1.2)
Total Protein: 7 g/dL (ref 6.5–8.1)

## 2021-04-04 LAB — TROPONIN I (HIGH SENSITIVITY)
Troponin I (High Sensitivity): 25 ng/L — ABNORMAL HIGH (ref <18)
Troponin I (High Sensitivity): 26 ng/L — ABNORMAL HIGH (ref <18)

## 2021-04-04 LAB — RESP PANEL BY RT-PCR (FLU A&B, COVID) ARPGX2
Influenza A by PCR: NEGATIVE
Influenza B by PCR: NEGATIVE
SARS Coronavirus 2 by RT PCR: NEGATIVE

## 2021-04-04 LAB — ECHOCARDIOGRAM COMPLETE: S' Lateral: 6.1 cm

## 2021-04-04 LAB — BRAIN NATRIURETIC PEPTIDE: B Natriuretic Peptide: >4500 pg/mL — ABNORMAL HIGH (ref 0.0–100.0)

## 2021-04-04 LAB — MAGNESIUM: Magnesium: 2 mg/dL (ref 1.7–2.4)

## 2021-04-04 IMAGING — CT CT HEAD W/O CM
3 series · 15 of 47 positions shown, 18 images · non-contrast
Comparison: None.

CLINICAL DATA: Dizziness.



[Series 4: head 5.0 h30s · axial · 0.49mm/px · z∈[-92,+43]mm · 9 of 33 slices shown, 12 images]
[im 3/33  brain]
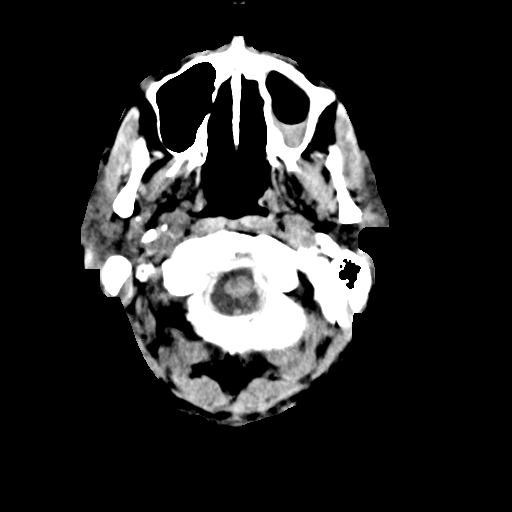
[im 3/33  bone]
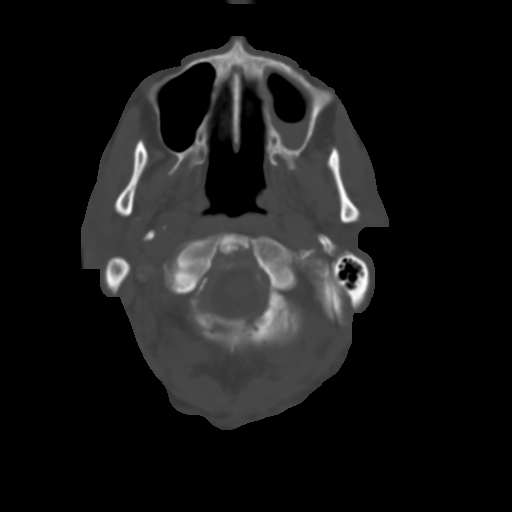
[im 6/33  brain]
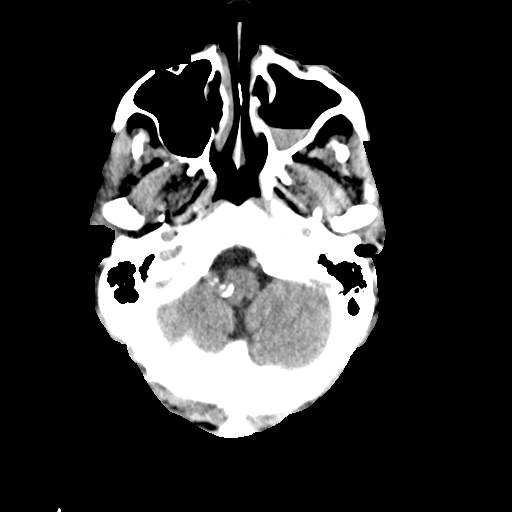
[im 9/33  brain]
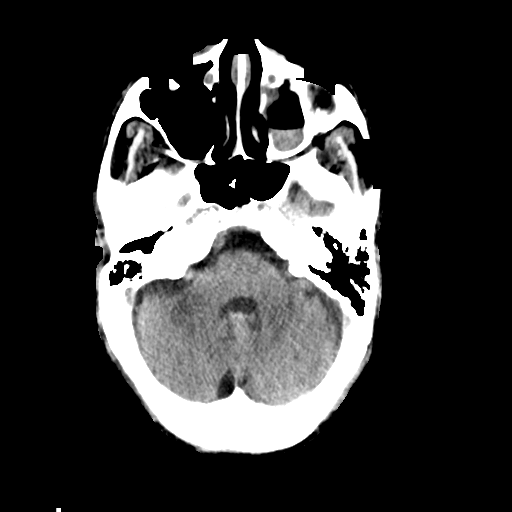
[im 13/33  brain]
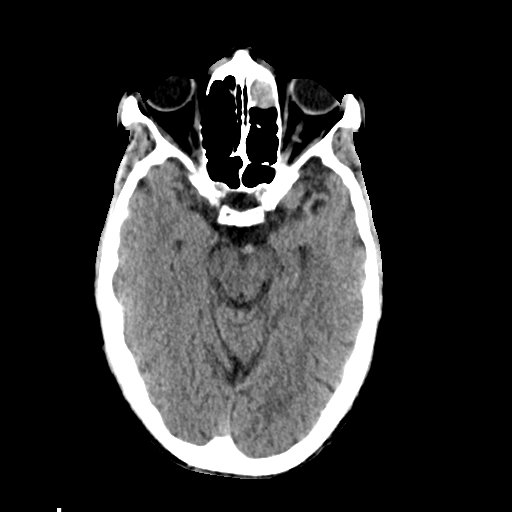
[im 17/33  brain]
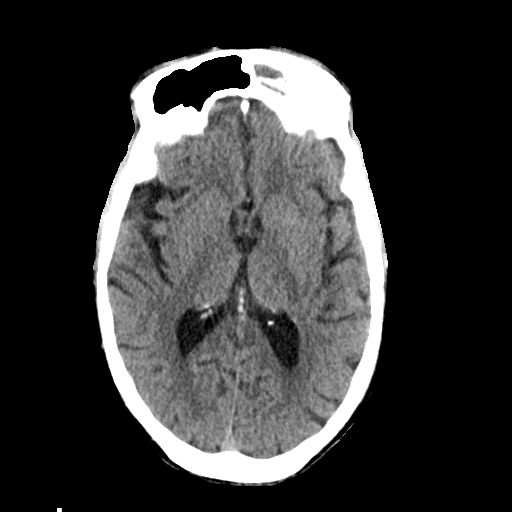
[im 17/33  bone]
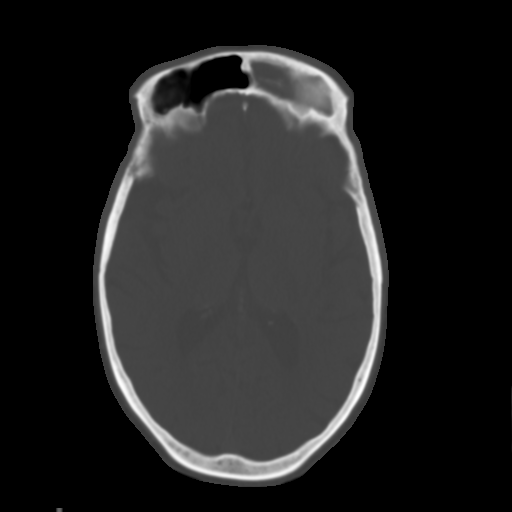
[im 20/33  brain]
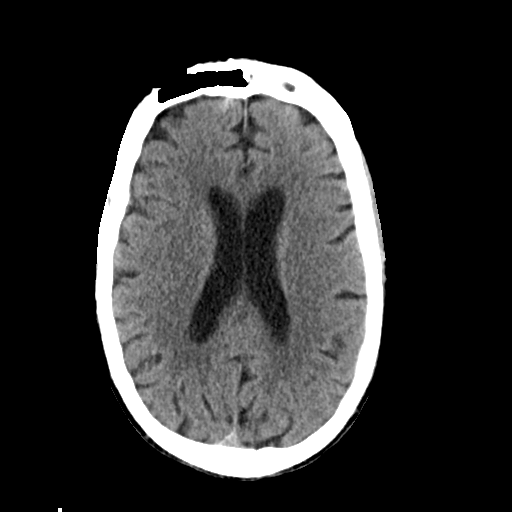
[im 24/33  brain]
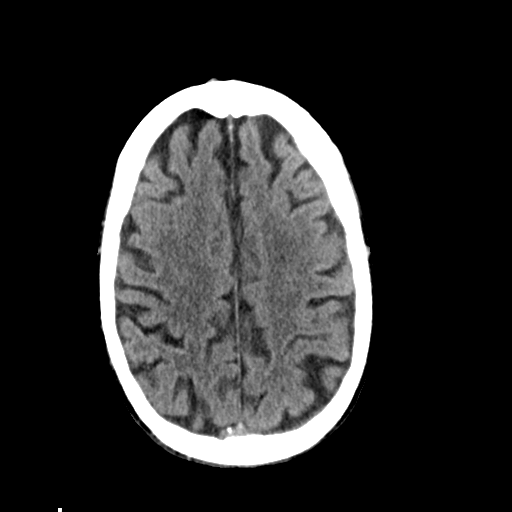
[im 27/33  brain]
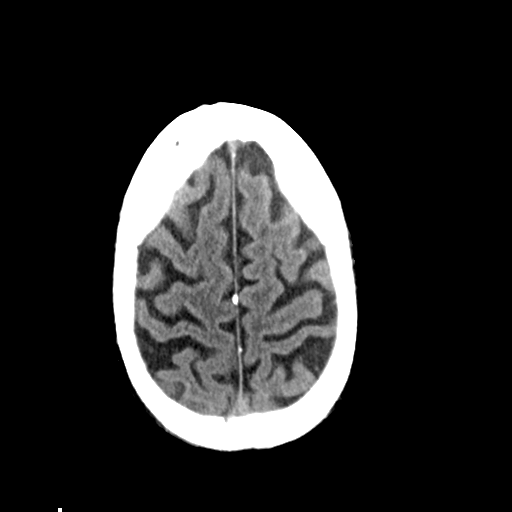
[im 30/33  brain]
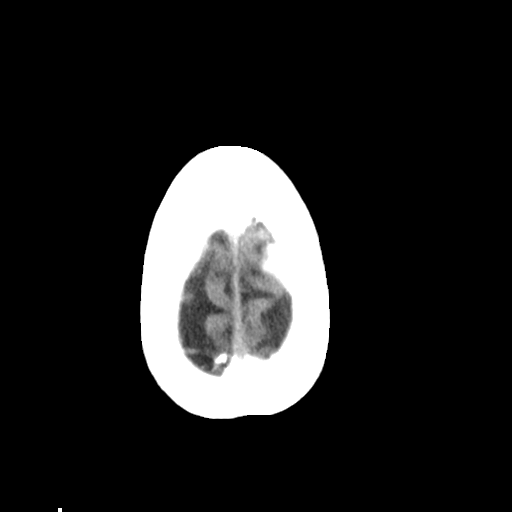
[im 30/33  bone]
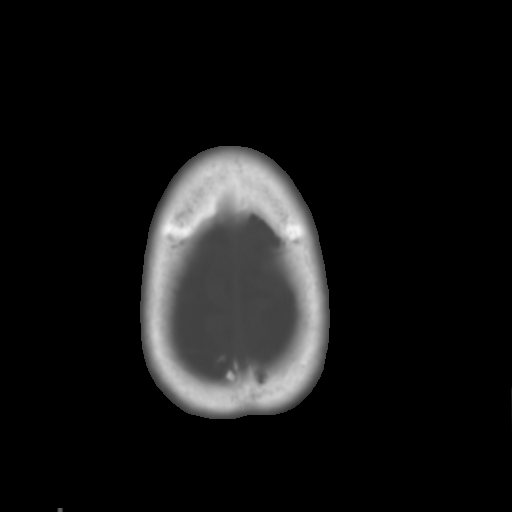

[Series 5: head 3.0 mpr cor · coronal · 0.33mm/px · 3 of 72 slices shown]
[im 24/72  brain]
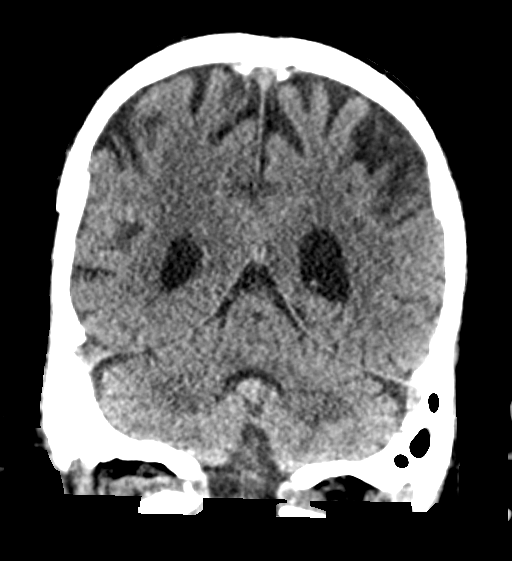
[im 32/72  brain]
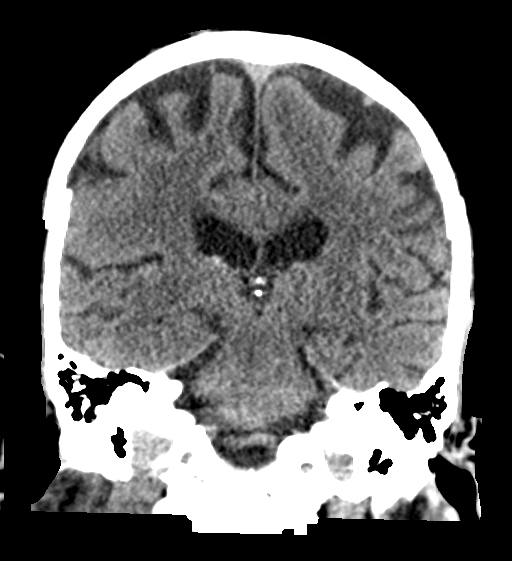
[im 40/72  brain]
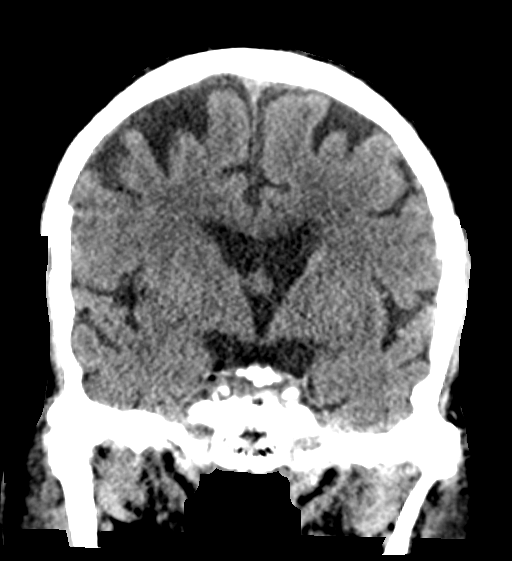

[Series 6: head 3.0 mpr sag · sagittal · 0.35mm/px · 3 of 56 slices shown]
[im 19/56  brain]
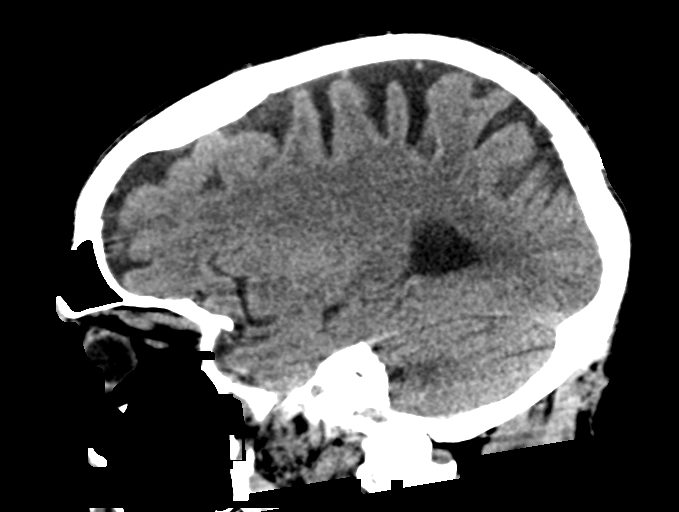
[im 28/56  brain]
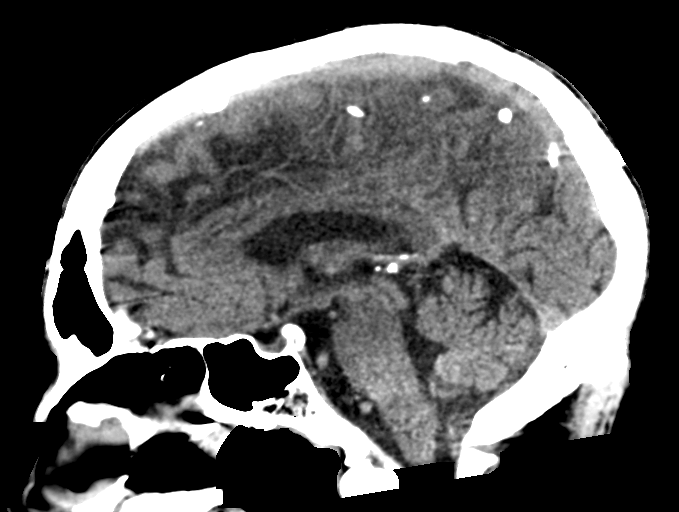
[im 37/56  brain]
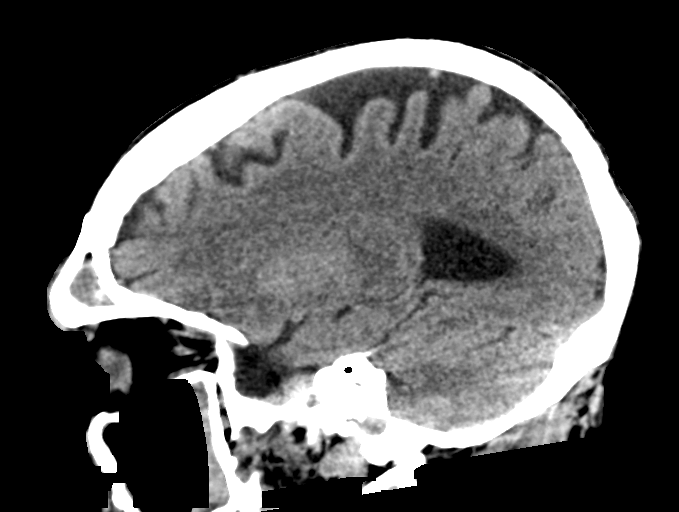

[15 of 47 positions shown; findings below may reference images not displayed]

FINDINGS: Brain: There is no evidence of an acute infarct, intracranial
hemorrhage, mass, midline shift, or extra-axial fluid collection.
Mild cerebral atrophy is within normal limits for age. Hypodensities
in the cerebral white matter are nonspecific but compatible with
minimal chronic small vessel ischemic disease. There are 2 punctate
calcifications in the pons.

Vascular: Calcified atherosclerosis at the skull base. No hyperdense
vessel.

Skull: No acute fracture or suspicious osseous lesion.

Sinuses/Orbits: Complete opacification of the left frontal sinus
with evidence of chronic sinusitis. Partial left anterior ethmoid
air cell opacification. Mucosal thickening and fluid in the left
maxillary sinus. Clear mastoid air cells. Unremarkable orbits.

Other: None.
IMPRESSION: 1. No evidence of acute intracranial abnormality.
2. Chronic left frontal sinusitis. Left maxillary sinus fluid,
correlate for acute sinusitis.

## 2021-04-04 IMAGING — DX DG CHEST 1V PORT
1 series · 1 of 1 positions shown · non-contrast
Comparison: Radiograph [DATE]

CLINICAL DATA: Shortness of breath

EXAM:
PORTABLE CHEST 1 VIEW

[chest]
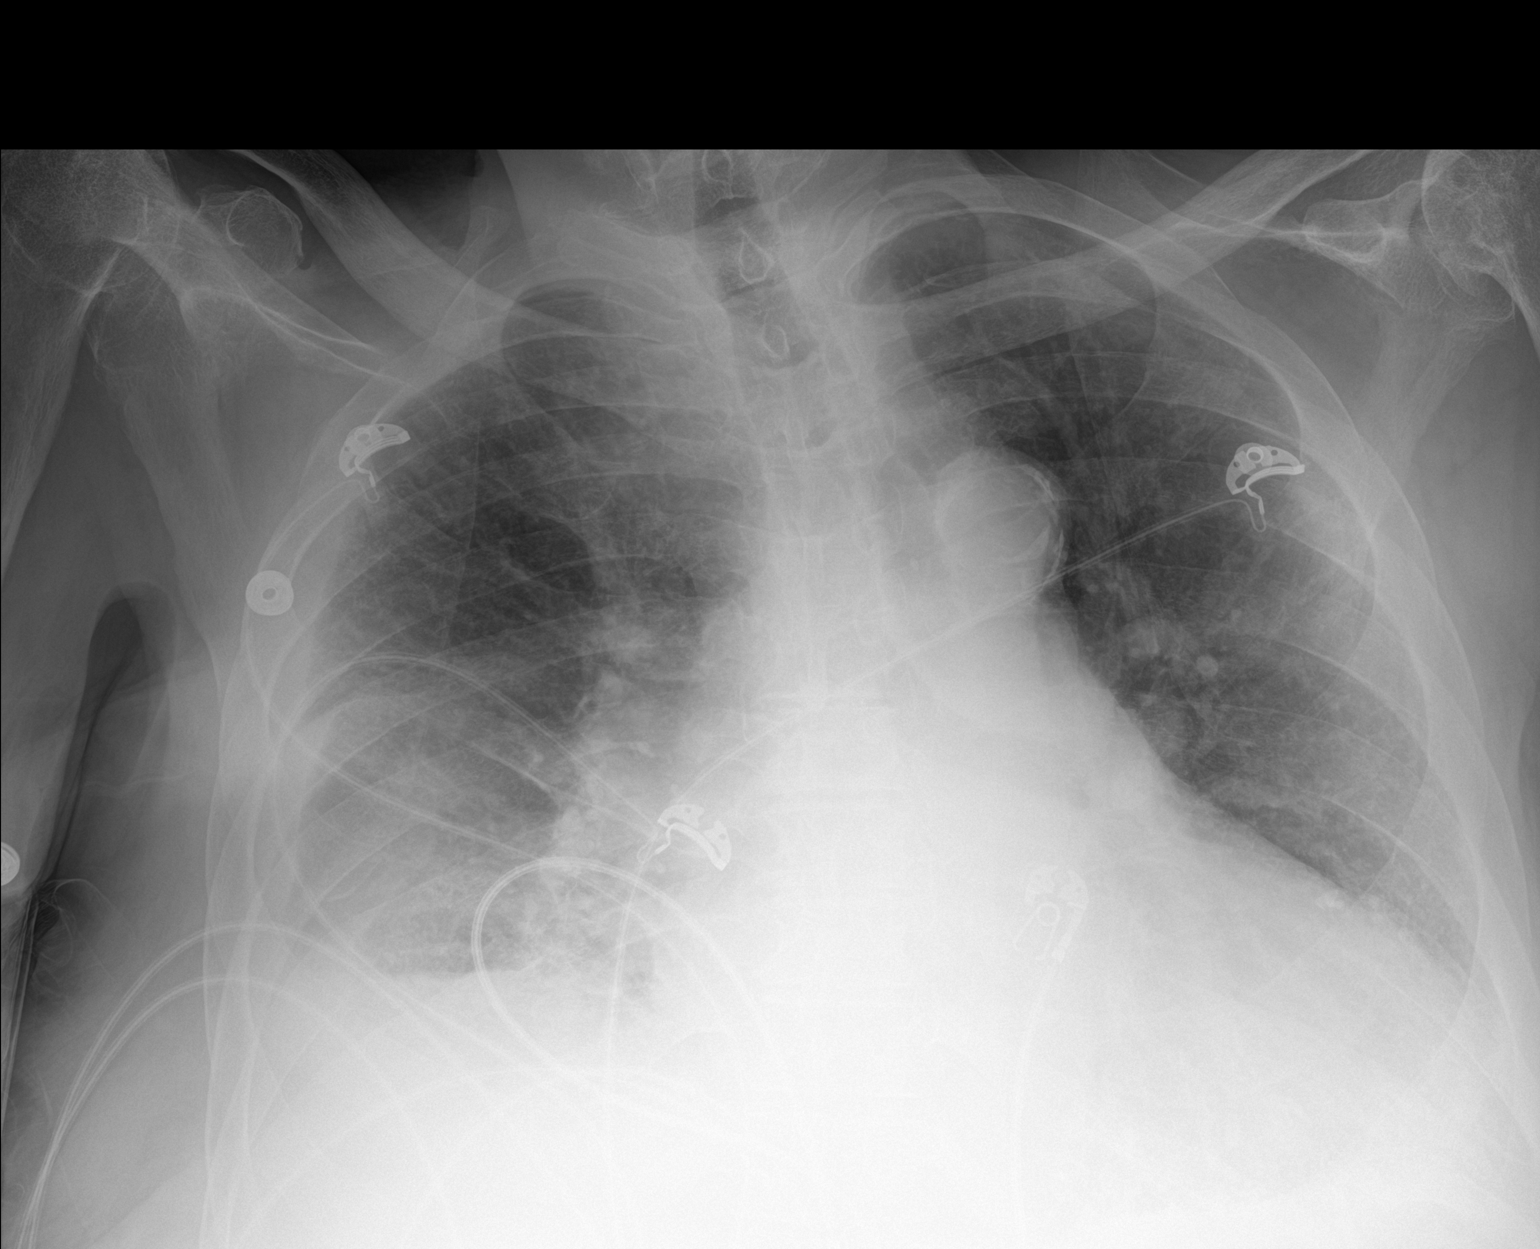

[1 of 1 positions shown; findings below may reference images not displayed]

FINDINGS: Unchanged, enlarged cardiac silhouette. Central pulmonary vascular
congestion. Mild edema. There is a moderate size layering right
pleural effusion with adjacent basilar opacities. Skin fold overlies
the left upper chest. No visible pneumothorax. Bilateral shoulder
osteoarthritis with findings of probable rotator cuff disease.
IMPRESSION: Cardiomegaly with mild pulmonary edema. Moderate layering right
pleural effusion with adjacent basilar opacities, likely
atelectasis.

## 2021-04-04 MED ORDER — ACETAMINOPHEN 325 MG PO TABS
650.0000 mg | ORAL_TABLET | ORAL | Status: DC | PRN
  Administered 2021-04-05 – 2021-04-07 (×4): 650 mg via ORAL
  Filled 2021-04-04 (×5): qty 2

## 2021-04-04 MED ORDER — CARVEDILOL 25 MG PO TABS
25.0000 mg | ORAL_TABLET | Freq: Two times a day (BID) | ORAL | Status: DC
  Administered 2021-04-04 – 2021-04-07 (×6): 25 mg via ORAL
  Filled 2021-04-04 (×3): qty 1
  Filled 2021-04-04: qty 2
  Filled 2021-04-04 (×2): qty 1

## 2021-04-04 MED ORDER — ATORVASTATIN CALCIUM 10 MG PO TABS
10.0000 mg | ORAL_TABLET | Freq: Every day | ORAL | Status: DC
Start: 2021-04-04 — End: 2021-04-08
  Administered 2021-04-04 – 2021-04-08 (×5): 10 mg via ORAL
  Filled 2021-04-04 (×5): qty 1

## 2021-04-04 MED ORDER — ENOXAPARIN SODIUM 40 MG/0.4ML IJ SOSY
40.0000 mg | PREFILLED_SYRINGE | INTRAMUSCULAR | Status: DC
  Administered 2021-04-04 – 2021-04-08 (×5): 40 mg via SUBCUTANEOUS
  Filled 2021-04-04 (×5): qty 0.4

## 2021-04-04 MED ORDER — SODIUM CHLORIDE 0.9% FLUSH
3.0000 mL | Freq: Two times a day (BID) | INTRAVENOUS | Status: DC
  Administered 2021-04-04 – 2021-04-08 (×7): 3 mL via INTRAVENOUS

## 2021-04-04 MED ORDER — SODIUM CHLORIDE 0.9% FLUSH: 3.0000 mL | INTRAVENOUS | Status: DC | PRN

## 2021-04-04 MED ORDER — SPIRONOLACTONE 25 MG PO TABS
25.0000 mg | ORAL_TABLET | Freq: Every day | ORAL | Status: DC
Start: 2021-04-04 — End: 2021-04-08
  Administered 2021-04-04 – 2021-04-07 (×4): 25 mg via ORAL
  Filled 2021-04-04 (×4): qty 1

## 2021-04-04 MED ORDER — FUROSEMIDE 10 MG/ML IJ SOLN
40.0000 mg | Freq: Once | INTRAMUSCULAR | Status: AC
  Administered 2021-04-04: 40 mg via INTRAVENOUS
  Filled 2021-04-04: qty 4

## 2021-04-04 MED ORDER — SODIUM CHLORIDE 0.9 % IV SOLN: 250.0000 mL | INTRAVENOUS | Status: DC | PRN

## 2021-04-04 MED ORDER — ASPIRIN EC 81 MG PO TBEC
81.0000 mg | DELAYED_RELEASE_TABLET | Freq: Every day | ORAL | Status: DC
  Administered 2021-04-04 – 2021-04-08 (×5): 81 mg via ORAL
  Filled 2021-04-04 (×5): qty 1

## 2021-04-04 MED ORDER — POTASSIUM CHLORIDE CRYS ER 20 MEQ PO TBCR
40.0000 meq | EXTENDED_RELEASE_TABLET | Freq: Once | ORAL | Status: AC
  Administered 2021-04-04: 40 meq via ORAL
  Filled 2021-04-04: qty 2

## 2021-04-04 NOTE — Hospital Course (Addendum)
Brief Hospital Course  Matthew [name] is an 81 yo male who presented to the [facility] on 2/20 with two to three weeks of progressive lower extremity swelling and dyspnea on exertion in the setting of non-adherence to his heart failure medications.   HFrEF Exacerbation On arrival, patient was noted to be clinically hypervolemic. He reported not taking any of his heart-failure medications for 1-2 weeks leading up to hospitalization. BNP on admission was >4,500 and CXR showed cardiomegaly with mild pulmonary edema. He was mildly tachypneic but remained stable on room air. He was started on IV Lasix in the Emergency Department. An echocardiogram was obtained which showed LVEF 20 to 25%, severe dilatation of all four chambers, severe mitral and tricuspid regurgitation. Cardiology was consulted and recommended a lasix drip with metolazone augmentation. He diuresed a total of 7.8L of fluid with rapid improvement in his symptoms. Cardiology adjusted his HF meds and he was discharged on Lasix 20mg  BID, Coreg 25mg  BID, Entresto 24/26mg  BID, Spironolactone 25mg  daily, and farxiga 10mg  daily. He will have close follow-up with their office as well.   AKI Patient's Creatinine on arrival was 1.6, up from his recent baseline of 1.1. This was presumed to be cardiorenal in etiology given his acute HF exacerbation. His creatinine remained stable throughout the hospitalization and he tolerated aggressive diuresis well.   Microcytic Anemia Patient's hemoglobin on admission was 11.6. MCV was 78.7. Iron studies showed an Iron of 22, TIBC 308, low iron sats at 7% and a ferritin of 110. He received a 1x dose of IV Feraheme in the hospital.    Items for PCP Follow-Up 1) Recommend repeat CBC, consider oral iron supplementation at followup  2) Recheck BMP within 1 week of discharge to monitor Cr and ensure electrolytes remain within normal limits 3) We are making a number of medication changes on discharge. We are also attempting  to arrange home health services to help with med management in the post-discharge period. Please discuss medication adherence with him at follow-up

## 2021-04-04 NOTE — ED Triage Notes (Signed)
PT arrives via EMS from home. Pt reports ongoing dizziness/vertigo that has worsened today. Pt also endorses worsening arthritis pain.

## 2021-04-04 NOTE — H&P (Signed)
[facility] Admission History and Physical Service Pager: [phone]  Patient name: Matthew [name] Medical record number: [id number] Date of birth: [birth year] Age: 81 y.o. Gender: male  Primary Care Provider: [name], MD Consultants: Cardiology Code Status: FULL Preferred Emergency Contact: Di Kindle, daughter Contact Information     Name Relation Home Work [place]  [id number]     Sunjay, Aniello Daughter   [phone]        Chief Complaint: Leg Swelling, DOE  Assessment and Plan: Matthew [name] is a 81 y.o. male presenting with two-three weeks of increasing leg swelling and dyspnea on exertion in the setting of non-adherence to his HF meds. PMH is significant for HFrEF (EF 25-30%), hypertension, Osteoarthritis of the bilateral knees.   HFrEF Exacerbation Patient presents with two to three weeks of increasing leg swelling and dyspnea on exertion in the setting of non-adherence to his home heart failure medications. Vitals on admission were significant for hypertension to 130s/110s and tachypnea to mid-20s. He remained stable on room air. Admission labs notable for BNP >4500, troponin 25>26, Cr 1.60, K 3.8, WBC 5.7, Hgb 11.7. CXR with cardiomegaly with mild pulmonary edema, moderate layering right pleural effusion with adjacent basilar opacities, likely atelectasis. Exam is notable for 3+ pitting edema to knees bilaterally with weeping wounds on RLE. Also with pulmonary exam consistent with fluid overload. Patient reports he is up about 10 lbs from his dry weight. Reports dry weight to be 245lbs.  Patient follows with [name], [facility], has not been seen since 04/2020. Home meds include Lasix 40mg  daily Monday-Friday, Coreg 25mg  BID, Farxiga 10mg  daily, Entresto 97-103mg  BID, and Spironolactone 25mg  daily. Last Echo in 04/2020 with EF 25-30% with severe dilatation of the LV. Also with mildly reduced RV function. No evidence of valvular  heart disease. He received IV Lasix 40mg  x1 in the ED.  - Admit to cardiac tele, Dr. Erin Hearing attending - Will re-dose IV Lasix at 2200 this evening - Continue home Coreg and Spironolactone - Hold home Jardiance and Entresto in the setting of AKI as discussed below, will restart when able - Echo - Consult to cardiology - Will add on mag to afternoon labs, replete to >2 - Replete K to >4 - Monitor electrolytes while diuresing - Strict I/O - Daily weights - PT/OT eval and treat  Irregular Heart Rhythm Admission EKG notable for ventricular bigeminy with multiple PVCs. Rhythm is markedly irregular on the monitor and auscultation. Per chart review, this is stable dating back to 2017.  - Cardiology consulted as above - On Coreg - Cardiac monitoring  AKI Cr 1.6, up from baseline around 1.1. Presumed cardiorenal in the setting of fluid overload.  - Diuresis as above - Monitor on am BMP  HLD Last LDL 44 in 03/22.  -Continue home atorvastatin 10 mg daily  Microcytic Anemia Hgb 11.7, MCV 78.7. No evidence of active bleed.  - Iron panel with am labs - AM CBC  Headache   Vertigo On arrival patient reported vertiginous symptoms to the ED triage nurse and ED provider.  On further questioning, they clarified that he was referring to difficulty while ambulating. He did not endorse any of these symptoms during our interview. CT head obtained in ED did not show any acute intracranial process. Do not suspect that this is a major contributor to his presentation. - Will monitor  FEN/GI: Heart healthy Prophylaxis: Lovenox  Disposition: Cardiac telemetry  History of Present Illness:  Matthew Eagan  [name] is a 81 y.o. male presenting with 2 to 3 weeks of worsening lower extremity edema and dyspnea on exertion.  For the past 2 to 3 weeks, Mr. Mickels reports worsening pain from his osteoarthritis in his knees.  He also endorses occasional headache over that same time period.  To treat his OA and  headache, he has been taking Tylenol regularly.  His daughter helps him with managing his medication and she noticed that he has been skipping several doses of his heart failure medications.  He reports skipping these due to concerns for adverse interactions between the Tylenol and his heart failure meds.   Over the same time period, he has had worsening swelling in his lower extremities and his daughter has noticed that he has not been able to walk around the house as well as he usually does due to shortness of breath while ambulating.  He weighs himself at home every other day and reports that he is up about 10 pounds from his dry weight which she reports to be 245 pounds.    Review Of Systems: Per HPI with the following additions:   Review of Systems  Constitutional:  Positive for activity change and unexpected weight change.  Respiratory:  Positive for shortness of breath. Negative for chest tightness.   Cardiovascular:  Positive for leg swelling. Negative for chest pain.  Gastrointestinal:  Negative for abdominal distention and abdominal pain.  Neurological:  Positive for dizziness. Negative for weakness and numbness.  All other systems reviewed and are negative.   Patient Active Problem List   Diagnosis Date Noted   S/P PICC central line placement 01/30/2017   Medication monitoring encounter 01/30/2017   Normocytic anemia 01/12/2017   Septic arthritis of knee, left ([place]) 01/11/2017   Erectile dysfunction 12/08/2015   Bigeminy 06/11/2015   Congestive dilated cardiomyopathy ([place]) 12/11/2013   Cardiomyopathy ([place]) 01/07/2013   Obesity 01/07/2013   Coronary atherosclerosis of native coronary artery 01/07/2013   Mixed hyperlipidemia 01/07/2013   Essential hypertension, benign 01/07/2013    Past Medical History: Past Medical History:  Diagnosis Date   CAD (coronary artery disease)    Cardiomyopathy ([place])    Heart failure ([place])    HTN (hypertension)    Obesity    Osteoarthritis      Past Surgical History: Past Surgical History:  Procedure Laterality Date   CHONDROPLASTY Left 01/11/2017   Procedure: CHONDROPLASTY of medial and patella compartment;  Surgeon: Dorna Leitz, MD;  Location: [place];  Service: Orthopedics;  Laterality: Left;   KNEE ARTHROSCOPY Left 01/11/2017   Procedure: ARTHROSCOPIC IRRIGATION AND DEBRIDMENT KNEE;  Surgeon: Dorna Leitz, MD;  Location: [place];  Service: Orthopedics;  Laterality: Left;   KNEE SURGERY     MENISECTOMY Left 01/11/2017   Procedure: Lateral MENISECTOMY;  Surgeon: Dorna Leitz, MD;  Location: [place];  Service: Orthopedics;  Laterality: Left;   SYNOVECTOMY Left 01/11/2017   Procedure: Four compartment SYNOVECTOMY;  Surgeon: Dorna Leitz, MD;  Location: [place];  Service: Orthopedics;  Laterality: Left;    Social History: Social History   Tobacco Use   Smoking status: Never   Smokeless tobacco: Never  Vaping Use   Vaping Use: Never used  Substance Use Topics   Alcohol use: No   Drug use: No   Additional social history: Denies smoking, alcohol, and recreational drug use.  Please also refer to relevant sections of EMR.  Family History: Family History  Problem Relation Age of Onset   Hypertension Father  Diabetes Brother    Heart attack Neg Hx    Stroke Neg Hx     Allergies and Medications: No Active Allergies No current facility-administered medications on file prior to encounter.   Current Outpatient Medications on File Prior to Encounter  Medication Sig Dispense Refill   aspirin EC 81 MG tablet Take 1 tablet (81 mg total) by mouth daily. 90 tablet 3   atorvastatin (LIPITOR) 10 MG tablet TAKE 1 TABLET(10 MG) BY MOUTH DAILY 90 tablet 3   carvedilol (COREG) 25 MG tablet TAKE 1 TABLET(25 MG) BY MOUTH TWICE DAILY WITH A MEAL 180 tablet 2   dapagliflozin propanediol (FARXIGA) 10 MG TABS tablet Take 1 tablet (10 mg total) by mouth daily. 90 tablet 3   furosemide (LASIX) 40 MG tablet Take 20 mg by mouth as directed.  Take 1/2 tablet by mouth Monday- Friday 90 tablet 2   sacubitril-valsartan (ENTRESTO) 97-103 MG Take 1 tablet by mouth 2 (two) times daily. 180 tablet 3   sildenafil (VIAGRA) 100 MG tablet Take 1 tablet (100 mg total) by mouth daily as needed for erectile dysfunction. 10 tablet 3   spironolactone (ALDACTONE) 25 MG tablet Take 1 tablet (25 mg total) by mouth daily. 90 tablet 3    Objective: BP (!) 126/108    Pulse 75    Temp 97.7 F (36.5 C)    Resp (!) 21    SpO2 96%  Exam: General: Elderly male, well-nourished, NAD Eyes: EOMs intact, sclerae anicteric, conjunctivae non-injected ENTM: Mucous membranes moist Neck: Supple, + JVD Cardiovascular: III/VI systolic murmur, S3 present, irregular rhythm corresponding to PVCs on monitor, + JVD Respiratory: Good air movement, Diminished lung sounds in R base, wet, coarse, crackles on L Gastrointestinal: Soft, non-tender, non-distended MSK: 3+ pitting edema to knees bilaterally, distal pulses intact Derm: Weeping wounds of RLE Neuro: Alert and oriented x4, without focal deficit Psych: Mood and affect are normal  Labs and Imaging: CBC BMET  Recent Labs  Lab 04/04/21 1251  WBC 5.7  HGB 11.7*  HCT 36.9*  PLT 116*   Recent Labs  Lab 04/04/21 1251  NA 136  K 3.8  CL 107  CO2 19*  BUN 23  CREATININE 1.60*  GLUCOSE 88  CALCIUM 8.7*     EKG: Bigeminy, multiple PVCs, no ST changes, ?RBBB, QTc 434  CT Head Wo Contrast CLINICAL DATA:  Dizziness.  EXAM: CT HEAD WITHOUT CONTRAST  TECHNIQUE: Contiguous axial images were obtained from the base of the skull through the vertex without intravenous contrast.  RADIATION DOSE REDUCTION: This exam was performed according to the departmental dose-optimization program which includes automated exposure control, adjustment of the mA and/or kV according to patient size and/or use of iterative reconstruction technique.  COMPARISON:  None.  FINDINGS: Brain: There is no evidence of an acute  infarct, intracranial hemorrhage, mass, midline shift, or extra-axial fluid collection. Mild cerebral atrophy is within normal limits for age. Hypodensities in the cerebral white matter are nonspecific but compatible with minimal chronic small vessel ischemic disease. There are 2 punctate calcifications in the pons.  Vascular: Calcified atherosclerosis at the skull base. No hyperdense vessel.  Skull: No acute fracture or suspicious osseous lesion.  Sinuses/Orbits: Complete opacification of the left frontal sinus with evidence of chronic sinusitis. Partial left anterior ethmoid air cell opacification. Mucosal thickening and fluid in the left maxillary sinus. Clear mastoid air cells. Unremarkable orbits.  Other: None.  IMPRESSION: 1. No evidence of acute intracranial abnormality. 2. Chronic left frontal  sinusitis. Left maxillary sinus fluid, correlate for acute sinusitis.  Electronically Signed   By: Logan Bores M.D.   On: 04/04/2021 12:15 DG Chest Port 1 View CLINICAL DATA:  Shortness of breath  EXAM: PORTABLE CHEST 1 VIEW  COMPARISON:  Radiograph 01/11/2017  FINDINGS: Unchanged, enlarged cardiac silhouette. Central pulmonary vascular congestion. Mild edema. There is a moderate size layering right pleural effusion with adjacent basilar opacities. Skin fold overlies the left upper chest. No visible pneumothorax. Bilateral shoulder osteoarthritis with findings of probable rotator cuff disease.  IMPRESSION: Cardiomegaly with mild pulmonary edema. Moderate layering right pleural effusion with adjacent basilar opacities, likely atelectasis.  Electronically Signed   By: Maurine Simmering M.D.   On: 04/04/2021 11:18    Eppie Gibson, MD 04/04/2021, 3:08 PM PGY-1, [place] [facility] pager: [phone], text pages welcome

## 2021-04-04 NOTE — Consult Note (Addendum)
Progress Note  Patient Name: Matthew [name] Date of Encounter: 04/04/2021  Primary Cardiologist: Larae Grooms, MD   Subjective   81 y.o. male with past medical history of hypertension, CAD, HFrEF, hyperlipidemia who presents to the hospital for dizziness and worsening LE edema.   Daughter states that patient's mobility has been limited recently due to knee pain.  He has been taking Tylenol and holding his CHF medications the last few days to avoid medication mixup.  He has not been taking Lasix the last few days and endorses reduced urine output.  He denies any urinary difficulty.  He denies chest pain or shortness of breath.  Daughter states that his LE edema has worsened.  Normally he only has edema in his ankles.  She has not noticed any significant shortness of breath.  He is using 3-4 pillow to sleep but that has not changed.     Inpatient Medications   Scheduled Meds:  aspirin EC  81 mg Oral Daily   atorvastatin  10 mg Oral Daily   carvedilol  25 mg Oral BID WC   enoxaparin (LOVENOX) injection  40 mg Subcutaneous Q24H   furosemide  40 mg Intravenous Once   potassium chloride  40 mEq Oral Once   sodium chloride flush  3 mL Intravenous Q12H   spironolactone  25 mg Oral Daily   Continuous Infusions:  sodium chloride     PRN Meds: sodium chloride, acetaminophen, sodium chloride flush   Vital Signs    Vitals:   04/04/21 1400 04/04/21 1415 04/04/21 1500 04/04/21 1522  BP: (!) 126/108 (!) 126/108 (!) 132/111   Pulse:  75 74   Resp: (!) 21 (!) 21 (!) 28   Temp:    97.9 F (36.6 C)  TempSrc:    Oral  SpO2: 96% 96% 100%    No intake or output data in the 24 hours ending 04/04/21 1623 There were no vitals filed for this visit.  Telemetry     - Personally Reviewed  ECG    2/20: Sinus rhythm, PVC, ventricular bigeminy, bifascicular block- Personally Reviewed  Physical Exam   GEN: No acute distress.   Neck: positive JVD Cardiac: RRR, no murmurs, rubs,  or gallops.  Respiratory: Normal work of breathing.  No supplemental oxygen GI: Soft, nontender, non-distended  MS: +2 edema of bilateral LE up to knee.  Extremities are warm to touch Neuro:  Nonfocal  Psych: Normal affect   Labs    Chemistry Recent Labs  Lab 04/04/21 1251  NA 136  K 3.8  CL 107  CO2 19*  GLUCOSE 88  BUN 23  CREATININE 1.60*  CALCIUM 8.7*  PROT 7.0  ALBUMIN 3.0*  AST 20  ALT 15  ALKPHOS 95  BILITOT 0.9  GFRNONAA 43*  ANIONGAP 10     Hematology Recent Labs  Lab 04/04/21 1251  WBC 5.7  RBC 4.69  HGB 11.7*  HCT 36.9*  MCV 78.7*  MCH 24.9*  MCHC 31.7  RDW 19.8*  PLT 116*    Cardiac EnzymesNo results for input(s): TROPONINI in the last 168 hours. No results for input(s): TROPIPOC in the last 168 hours.   BNP Recent Labs  Lab 04/04/21 1251  BNP >4,500.0*     DDimer No results for input(s): DDIMER in the last 168 hours.   Radiology    CT Head Wo Contrast  Result Date: 04/04/2021 CLINICAL DATA:  Dizziness. EXAM: CT HEAD WITHOUT CONTRAST TECHNIQUE: Contiguous axial images were obtained from  the base of the skull through the vertex without intravenous contrast. RADIATION DOSE REDUCTION: This exam was performed according to the departmental dose-optimization program which includes automated exposure control, adjustment of the mA and/or kV according to patient size and/or use of iterative reconstruction technique. COMPARISON:  None. FINDINGS: Brain: There is no evidence of an acute infarct, intracranial hemorrhage, mass, midline shift, or extra-axial fluid collection. Mild cerebral atrophy is within normal limits for age. Hypodensities in the cerebral white matter are nonspecific but compatible with minimal chronic small vessel ischemic disease. There are 2 punctate calcifications in the pons. Vascular: Calcified atherosclerosis at the skull base. No hyperdense vessel. Skull: No acute fracture or suspicious osseous lesion. Sinuses/Orbits: Complete  opacification of the left frontal sinus with evidence of chronic sinusitis. Partial left anterior ethmoid air cell opacification. Mucosal thickening and fluid in the left maxillary sinus. Clear mastoid air cells. Unremarkable orbits. Other: None. IMPRESSION: 1. No evidence of acute intracranial abnormality. 2. Chronic left frontal sinusitis. Left maxillary sinus fluid, correlate for acute sinusitis. Electronically Signed   By: Logan Bores M.D.   On: 04/04/2021 12:15   DG Chest Port 1 View  Result Date: 04/04/2021 CLINICAL DATA:  Shortness of breath EXAM: PORTABLE CHEST 1 VIEW COMPARISON:  Radiograph 01/11/2017 FINDINGS: Unchanged, enlarged cardiac silhouette. Central pulmonary vascular congestion. Mild edema. There is a moderate size layering right pleural effusion with adjacent basilar opacities. Skin fold overlies the left upper chest. No visible pneumothorax. Bilateral shoulder osteoarthritis with findings of probable rotator cuff disease. IMPRESSION: Cardiomegaly with mild pulmonary edema. Moderate layering right pleural effusion with adjacent basilar opacities, likely atelectasis. Electronically Signed   By: Maurine Simmering M.D.   On: 04/04/2021 11:18    Cardiac Studies   Pending repeat echocardiogram  Patient Profile     81 y.o. male with past medical history of hypertension, CAD, HFrEF, hyperlipidemia who presents to the hospital for dizziness and worsening LE edema, found to be in heart failure exacerbation.  Assessment & Plan    Acute on chronic HFrEF Hypertensive cardiomyopathy CAD In the setting of medication nonadherence.  EF last March was 25-30%.  His HFrEF is mainly thought due to hypertensive cardiomyopathy.  He had a cardiac cath in 2012 which showed severe stenosis of the diagonal, which is not severe enough to cause his EF reduction. He has symptoms of both right and left heart failure.  Patient is warm and wet on physical exam.  No signs of cardiogenic shock. -Lowest weight  around 231 lbs in July.  -Continue IV Lasix 40 mg tonight.  If urine output is not optimal, will add bumetanide and change to Lasix drip tomorrow. -Pending repeat echocardiogram -Can consider biventricular pacing device with his bifascicular block. -Continue Coreg and Arlyce Harman -Continue aspirin  AKI  Presumed cardiorenal in the setting of volume overload. -Continue diuresis -Consider getting renal ultrasound and UA to rule out other etiologies -Holding Entresto and Farxiga  Hyperlipidemia LDL 44 in March -Continue Lipitor 10 mg      For questions or updates, please contact [place] Please consult [email or web address] for contact info under Cardiology/STEMI.      Signed, Gaylan Gerold, DO  04/04/2021, 4:23 PM      ATTENDING ATTESTATION:  After conducting a review of all available clinical information with the care team, interviewing the patient, and performing a physical exam, I agree with the findings and plan described in this note.   GEN: No acute distress. Cardiac: RRR, no murmurs, rubs, or  gallops.  +JVD  Respiratory: Crackles bilaterally at bases GI: Soft, nontender, non-distended  MS: 4+ edema; No deformity. Neuro:  Nonfocal  Vasc:  +2 radial pulses  Patient is a 82 year old male with a history of mild obstructive coronary artery disease on cardiac catheterization, hypertension, hyperlipidemia, and nonischemic cardiomyopathy here with acute on chronic systolic heart failure.  This is due to patient medical noncompliance as he was taking Tylenol and did not think he can take his cardiac medications at the same time.  We will diurese with IV Lasix.  His creatinine is elevated.  He is not well afterload reduced.  Continue beta-blocker for now as not in cardiogenic shock.  Continue spironolactone and watch his potassium.  If needed we may have to start a nitroglycerin drip for afterload reduction and to enhance renal perfusion.  Follow-up echocardiogram.  I did broach the subject  of a primary prevention ICD with the patient and his daughter.  He will need a trial of medical therapy though his echocardiogram last year demonstrated severe cardiomyopathy.  He does not have a left bundle branch block though he has a right bundle branch block and left anterior fascicular block.  CRT is not indicated.   Lenna Sciara, MD Pager [phone]

## 2021-04-04 NOTE — Progress Notes (Signed)
°  Echocardiogram 2D Echocardiogram has been performed.  [name] 04/04/2021, 5:11 PM

## 2021-04-04 NOTE — ED Provider Notes (Signed)
[facility] EMERGENCY DEPARTMENT Provider Note   CSN: [id number] Arrival date & time: 04/04/21  1032     History  Chief Complaint  Patient presents with   Dizziness   Generalized Body Aches    +    Matthew [name] is a 81 y.o. male.  Pt is an 80y/o male with hx of HTN, CAD, cardiomyopathy EF 25-30% on 05/10/20 on Entresto, carvedilol, 40 mg of Lasix 5 days a week who is presenting today with EMS due to symptoms of feeling dizzy as well as swelling in his lower extremities.  Patient reports that he has had vertigo for years and it is intermittent.  It feels like he is walking sideways and it is much worse when he tries to get up and move around.  He denies having any falls and denies having nausea or vomiting associated with this.  He reports he has had several episodes over the last few days but feels better currently.  Secondly he complains of worsening swelling in his lower extremities.  He reports for the last 2 to 3 weeks he is noticed a lot of swelling and then started noticing today that his sock was wet on his right foot but he was not sure why.  He also reports he has been having a lot of pain in his ankles and he has been taking a prescription pain medication but he has not been taking his other medications as regularly because he did not feel like he should mix all the medicines together.  His daughter is currently not present but he is not sure how much Lasix he is currently on but reports he is making a fair amount of urine.  He has a cough intermittently that will have sputum but not regularly.  He denies feeling short of breath at this time but EMS noted significant shortness of breath with exertion.  Patient also reports that he checks his weight regularly and he is up 10 pounds in the last 2 to 3 weeks.  He denies any chest pain and reports he has no significant abdominal pain except when he coughs he will have pain in his pelvis.  The history is provided by the  patient and medical records.  Dizziness     Home Medications Prior to Admission medications   Medication Sig Start Date End Date Taking? Authorizing Provider  aspirin EC 81 MG tablet Take 1 tablet (81 mg total) by mouth daily. 03/29/17   Corky Crafts, MD  atorvastatin (LIPITOR) 10 MG tablet TAKE 1 TABLET(10 MG) BY MOUTH DAILY Patient taking differently: Take 10 mg by mouth daily. 07/19/20   Corky Crafts, MD  carvedilol (COREG) 25 MG tablet TAKE 1 TABLET(25 MG) BY MOUTH TWICE DAILY WITH A MEAL 08/23/20   Corky Crafts, MD  dapagliflozin propanediol (FARXIGA) 10 MG TABS tablet Take 1 tablet (10 mg total) by mouth daily. 07/09/20   Corky Crafts, MD  furosemide (LASIX) 40 MG tablet Take 1/2 tablet by mouth Monday- Friday 07/09/20   Corky Crafts, MD  sacubitril-valsartan (ENTRESTO) 97-103 MG Take 1 tablet by mouth 2 (two) times daily. 08/02/20   Corky Crafts, MD  sildenafil (VIAGRA) 100 MG tablet Take 1 tablet (100 mg total) by mouth daily as needed for erectile dysfunction. 02/26/18   Corky Crafts, MD  spironolactone (ALDACTONE) 25 MG tablet Take 1 tablet (25 mg total) by mouth daily. 08/18/20   Corky Crafts, MD  Allergies    Patient has no known allergies.    Review of Systems   Review of Systems  Neurological:  Positive for dizziness.   Physical Exam Updated Vital Signs BP (!) 126/108    Pulse 75    Temp 97.7 F (36.5 C)    Resp (!) 21    SpO2 96%  Physical Exam Vitals and nursing note reviewed.  Constitutional:      General: He is not in acute distress.    Appearance: He is well-developed.  HENT:     Head: Normocephalic and atraumatic.  Eyes:     Conjunctiva/sclera: Conjunctivae normal.     Pupils: Pupils are equal, round, and reactive to light.  Cardiovascular:     Rate and Rhythm: Normal rate and regular rhythm.     Heart sounds: No murmur heard. Pulmonary:     Effort: Pulmonary effort is normal. No respiratory  distress.     Breath sounds: Normal breath sounds. No wheezing or rales.  Abdominal:     General: There is no distension.     Palpations: Abdomen is soft.     Tenderness: There is no abdominal tenderness. There is no guarding or rebound.  Musculoskeletal:        General: No tenderness. Normal range of motion.     Cervical back: Normal range of motion and neck supple.     Right lower leg: Edema present.     Left lower leg: Edema present.     Comments: 3+ pitting edema bilateral lower extremities up to the knee.  Open weeping wound present in the right lower ankle region.  Pulses are palpated bilaterally.  Feet are warm bilaterally with less than 3-second capillary refill  Skin:    General: Skin is warm and dry.     Findings: No erythema or rash.  Neurological:     Mental Status: He is alert and oriented to person, place, and time. Mental status is at baseline.     Comments: 4 out of 5 strength in bilateral lower extremities.  Barely able to lift his legs off the bed as he reports they feel very heavy.  5 out of 5 strength in bilateral upper extremities, no pronator drift noted in any extremity.  Normal finger-to-nose.  No notable nystagmus.  Normal speech and no facial droop.  Psychiatric:        Mood and Affect: Mood normal.        Behavior: Behavior normal.    ED Results / Procedures / Treatments   Labs (all labs ordered are listed, but only abnormal results are displayed) Labs Reviewed  CBC WITH DIFFERENTIAL/PLATELET - Abnormal; Notable for the following components:      Result Value   Hemoglobin 11.7 (*)    HCT 36.9 (*)    MCV 78.7 (*)    MCH 24.9 (*)    RDW 19.8 (*)    Platelets 116 (*)    All other components within normal limits  COMPREHENSIVE METABOLIC PANEL - Abnormal; Notable for the following components:   CO2 19 (*)    Creatinine, Ser 1.60 (*)    Calcium 8.7 (*)    Albumin 3.0 (*)    GFR, Estimated 43 (*)    All other components within normal limits  BRAIN  NATRIURETIC PEPTIDE - Abnormal; Notable for the following components:   B Natriuretic Peptide >4,500.0 (*)    All other components within normal limits  TROPONIN I (HIGH SENSITIVITY) - Abnormal; Notable for the  following components:   Troponin I (High Sensitivity) 25 (*)    All other components within normal limits  RESP PANEL BY RT-PCR (FLU A&B, COVID) [id number]  TROPONIN I (HIGH SENSITIVITY)    EKG EKG Interpretation  Date/Time:  Monday April 04 2021 10:54:15 EST Ventricular Rate:  73 PR Interval:  215 QRS Duration: 145 QT Interval:  361 QTC Calculation: 398 R Axis:   75 Text Interpretation: Sinus rhythm Ventricular bigeminy Borderline prolonged PR interval Right bundle branch block Anterior infarct, old No significant change since last tracing Confirmed by Blanchie Dessert [phone]) on 04/04/2021 11:22:25 AM  Radiology CT Head Wo Contrast  Result Date: 04/04/2021 CLINICAL DATA:  Dizziness. EXAM: CT HEAD WITHOUT CONTRAST TECHNIQUE: Contiguous axial images were obtained from the base of the skull through the vertex without intravenous contrast. RADIATION DOSE REDUCTION: This exam was performed according to the departmental dose-optimization program which includes automated exposure control, adjustment of the mA and/or kV according to patient size and/or use of iterative reconstruction technique. COMPARISON:  None. FINDINGS: Brain: There is no evidence of an acute infarct, intracranial hemorrhage, mass, midline shift, or extra-axial fluid collection. Mild cerebral atrophy is within normal limits for age. Hypodensities in the cerebral white matter are nonspecific but compatible with minimal chronic small vessel ischemic disease. There are 2 punctate calcifications in the pons. Vascular: Calcified atherosclerosis at the skull base. No hyperdense vessel. Skull: No acute fracture or suspicious osseous lesion. Sinuses/Orbits: Complete opacification of the left frontal sinus with evidence of chronic  sinusitis. Partial left anterior ethmoid air cell opacification. Mucosal thickening and fluid in the left maxillary sinus. Clear mastoid air cells. Unremarkable orbits. Other: None. IMPRESSION: 1. No evidence of acute intracranial abnormality. 2. Chronic left frontal sinusitis. Left maxillary sinus fluid, correlate for acute sinusitis. Electronically Signed   By: Logan Bores M.D.   On: 04/04/2021 12:15   DG Chest Port 1 View  Result Date: 04/04/2021 CLINICAL DATA:  Shortness of breath EXAM: PORTABLE CHEST 1 VIEW COMPARISON:  Radiograph 01/11/2017 FINDINGS: Unchanged, enlarged cardiac silhouette. Central pulmonary vascular congestion. Mild edema. There is a moderate size layering right pleural effusion with adjacent basilar opacities. Skin fold overlies the left upper chest. No visible pneumothorax. Bilateral shoulder osteoarthritis with findings of probable rotator cuff disease. IMPRESSION: Cardiomegaly with mild pulmonary edema. Moderate layering right pleural effusion with adjacent basilar opacities, likely atelectasis. Electronically Signed   By: Maurine Simmering M.D.   On: 04/04/2021 11:18    Procedures Procedures    Medications Ordered in ED Medications  furosemide (LASIX) injection 40 mg (has no administration in time range)    ED Course/ Medical Decision Making/ A&P                           Medical Decision Making Amount and/or Complexity of Data Reviewed Independent Historian: caregiver and EMS External Data Reviewed: labs and notes. Labs: ordered. Decision-making details documented in ED Course. Radiology: ordered and independent interpretation performed. Decision-making details documented in ED Course. ECG/medicine tests: ordered and independent interpretation performed. Decision-making details documented in ED Course.  Risk Prescription drug management.   Elderly male with multiple medical problems presenting today with 2 separate complaints.  Initially complaining of vertigo  which seems to be an intermittent issue for him but also of symptoms most classic for fluid overload.  He has evidence of distal edema today and reports he is 10 pounds up with an EF of 25 to 30%.  Patient is on carvedilol, Entresto and Lasix but did report he does not think he has been taking his medications as frequently recently because he has been on prescription medication for his ankle pain.  His daughter is not currently present to give specific information about his medications but she is on the way.  Patient is having no chest pain denies any resting dyspnea but EMS reported shortness of breath with exertion.  Patient is not specifically complaining of infectious symptoms classic for pneumonia, COVID, acute abdominal process but concern for CHF exacerbation today.  Symptoms seem more vertiginous and possibly a peripheral cause given his history of prior symptoms that are similar but will do a scan to ensure no evidence of new intracranial pathology.  No notable findings on physical exam today to suggest a central vertiginous cause however patient was not able to stand or walk more related to shortness of breath and swelling in his legs.  We will gather further information from the daughter when she arrives.  EMS also gave intermittent information and external medical records from his cardiology visit within the last year were reviewed.  Labs and imaging are pending.  EKG shows a sinus rhythm with frequent PVCs which is not significantly different from prior EKGs based on my independent interpretation.  2:41 PM Patient's daughter is now present and reports that the biggest concern is that patient has been having more shortness of breath and leg swelling over the last 2 to 3 weeks.  She reports that normally he would be able to walk out to his car and drive where he wants to go where now he becomes tired and winded even with walking across the room and at home.  She also had noticed that he had at least  missed 2-3 doses of his medications this week because he thought it would not be good for him to take that with Tylenol which is the only pain medication he is on.  He did receive steroid injections in his knees a few weeks ago but has not had significant improvement.  I independently interpreted patient's labs and today he has findings concerning for heart failure.  BNP is greater than 4500, mild increase in his creatinine to 1.6 from his baseline of 1.2 was normal LFTs may be cardiorenal syndrome.  CBC with stable white count mild decrease in platelets today and hemoglobin of 11.  Troponin is mildly elevated at 25 and COVID is negative.  I independently viewed and interpreted patient's chest x-ray which appears to have pulmonary edema today and cardiomegaly.  Radiology reported moderate layering of a right pleural effusion as well and head CT without acute etiology.  Been patient's worsening symptoms, inability to function well at home, significant fluid overload feel that he meets criteria for admission and diuresis.  Findings discussed with the patient and his family member.  They are comfortable with this plan.  Patient given IV Lasix.  Patient remained on cardiac monitoring with persistent frequent PVCs and frequent bigeminy  CRITICAL CARE Performed by: Farrie Sann Total critical care time: 30 minutes Critical care time was exclusive of separately billable procedures and treating other patients. Critical care was necessary to treat or prevent imminent or life-threatening deterioration. Critical care was time spent personally by me on the following activities: development of treatment plan with patient and/or surrogate as well as nursing, discussions with consultants, evaluation of patient's response to treatment, examination of patient, obtaining history from patient or surrogate, ordering and performing treatments and  interventions, ordering and review of laboratory studies, ordering and review of  radiographic studies, pulse oximetry and re-evaluation of patient's condition.        Final Clinical Impression(s) / ED Diagnoses Final diagnoses:  Acute on chronic congestive heart failure, unspecified heart failure type [facility])    Rx / DC Orders ED Discharge Orders     None         Blanchie Dessert, MD 04/04/21 1441

## 2021-04-05 ENCOUNTER — Encounter (HOSPITAL_COMMUNITY): Payer: Self-pay | Admitting: Family Medicine

## 2021-04-05 DIAGNOSIS — I509 Heart failure, unspecified: Secondary | ICD-10-CM | POA: Diagnosis not present

## 2021-04-05 LAB — BASIC METABOLIC PANEL
Anion gap: 11 (ref 5–15)
Anion gap: 11 (ref 5–15)
BUN: 25 mg/dL — ABNORMAL HIGH (ref 8–23)
BUN: 27 mg/dL — ABNORMAL HIGH (ref 8–23)
CO2: 21 mmol/L — ABNORMAL LOW (ref 22–32)
CO2: 18 mmol/L — ABNORMAL LOW (ref 22–32)
Calcium: 8.8 mg/dL — ABNORMAL LOW (ref 8.9–10.3)
Calcium: 8.6 mg/dL — ABNORMAL LOW (ref 8.9–10.3)
Chloride: 106 mmol/L (ref 98–111)
Chloride: 108 mmol/L (ref 98–111)
Creatinine, Ser: 1.7 mg/dL — ABNORMAL HIGH (ref 0.61–1.24)
Creatinine, Ser: 1.73 mg/dL — ABNORMAL HIGH (ref 0.61–1.24)
GFR, Estimated: 40 mL/min — ABNORMAL LOW (ref >60)
GFR, Estimated: 39 mL/min — ABNORMAL LOW (ref >60)
Glucose, Bld: 106 mg/dL — ABNORMAL HIGH (ref 70–99)
Glucose, Bld: 111 mg/dL — ABNORMAL HIGH (ref 70–99)
Potassium: 4 mmol/L (ref 3.5–5.1)
Potassium: 4.5 mmol/L (ref 3.5–5.1)
Sodium: 138 mmol/L (ref 135–145)
Sodium: 137 mmol/L (ref 135–145)

## 2021-04-05 LAB — URINALYSIS, ROUTINE W REFLEX MICROSCOPIC
Bilirubin Urine: NEGATIVE
Glucose, UA: NEGATIVE mg/dL
Ketones, ur: NEGATIVE mg/dL
Nitrite: NEGATIVE
Protein, ur: 30 mg/dL — AB
Specific Gravity, Urine: 1.011 (ref 1.005–1.030)
WBC, UA: >50 WBC/hpf — ABNORMAL HIGH (ref 0–5)
pH: 5 (ref 5.0–8.0)

## 2021-04-05 LAB — CBC
HCT: 36.7 % — ABNORMAL LOW (ref 39.0–52.0)
Hemoglobin: 11.5 g/dL — ABNORMAL LOW (ref 13.0–17.0)
MCH: 25 pg — ABNORMAL LOW (ref 26.0–34.0)
MCHC: 31.3 g/dL (ref 30.0–36.0)
MCV: 79.8 fL — ABNORMAL LOW (ref 80.0–100.0)
Platelets: 122 10*3/uL — ABNORMAL LOW (ref 150–400)
RBC: 4.6 MIL/uL (ref 4.22–5.81)
RDW: 20 % — ABNORMAL HIGH (ref 11.5–15.5)
WBC: 6.4 10*3/uL (ref 4.0–10.5)
nRBC: 0 % (ref 0.0–0.2)

## 2021-04-05 LAB — IRON AND TIBC
Iron: 22 ug/dL — ABNORMAL LOW (ref 45–182)
Saturation Ratios: 7 % — ABNORMAL LOW (ref 17.9–39.5)
TIBC: 308 ug/dL (ref 250–450)
UIBC: 286 ug/dL

## 2021-04-05 LAB — FERRITIN: Ferritin: 110 ng/mL (ref 24–336)

## 2021-04-05 MED ORDER — METOLAZONE 2.5 MG PO TABS
2.5000 mg | ORAL_TABLET | Freq: Once | ORAL | Status: AC
  Administered 2021-04-05: 2.5 mg via ORAL
  Filled 2021-04-05: qty 1

## 2021-04-05 MED ORDER — FUROSEMIDE 10 MG/ML IJ SOLN
7.5000 mg/h | INTRAVENOUS | Status: DC
  Administered 2021-04-05: 5 mg/h via INTRAVENOUS
  Administered 2021-04-06: 7.5 mg/h via INTRAVENOUS
  Filled 2021-04-05 (×2): qty 20

## 2021-04-05 NOTE — Progress Notes (Signed)
FPTS Brief Progress Note  S:Patient reports that he is needing his O2 monitor put back on. He denies any difficulty with breathing at this time.    O: BP (!) 117/92 (BP Location: Left Arm)    Pulse 71    Temp 98.2 F (36.8 C) (Oral)    Resp 18    Ht 6\' 7"  (2.007 m)    Wt 115.2 kg Comment: scale c   SpO2 98%    BMI 28.60 kg/m   General: chronically ill-appearing elderly male CV: RRR on telemetry Respiratory: breathing comfortably on room air, loud breath sounds Extremities: 3+ pitting edema with weeping sore on right leg.    A/P: HFrEF Exacerbation - Orders reviewed. Labs for AM ordered, which was adjusted as needed.  - Patient remains on lasix drip - Documented UOP remains net positive currently - Closely monitoring electrolytes  Charlottie Peragine, DO 04/05/2021, 10:56 PM PGY-2, [facility] Night Resident  Please page [phone] with questions.

## 2021-04-05 NOTE — Progress Notes (Addendum)
FPTS Brief Progress Note  S:Patient has no acute complaints, he reports that he is urinating a lot. He does not have any specific areas of pain at this time. He would like to have a a few more pillows to prop up in bed.    O: BP 112/86    Pulse 76    Temp 98.1 F (36.7 C) (Oral)    Resp (!) 23    Ht 6\' 7"  (2.007 m)    Wt 114.7 kg    SpO2 98%    BMI 28.49 kg/m   General: elderly male, NAD, sitting up in bed CV: RRR, 3+ BLE pitting edema Respiratory: breathing comfortably on room air, no increased WOB  A/P: HFrEF Exacerbation Echo showed EF 20-25% with LV global hypokinesis and dilatation with LVH. RV severely enlarged with severely reduced function. Biatrial severe dilatation. - Orders reviewed. Labs for AM ordered, which was adjusted as needed.  - If condition changes, plan includes additional lasix with supplemental O2 as necessary.  - Cardiology following, appreciate recs  , DO 04/05/2021, 12:26 AM PGY-2, [facility] Night Resident  Please page [phone] with questions.

## 2021-04-05 NOTE — Progress Notes (Signed)
PT Cancellation Note  Patient Details Name: ARDEN TINOCO MRN: [id number] DOB: [birth year]   Cancelled Treatment:    Reason Eval/Treat Not Completed: Patient declined, no reason specified. Will continue attempts.   Angelina Ok [facility] 04/05/2021, 11:30 AM Skip Mayer PT Acute Rehabilitation Services Pager [phone] Office [phone]

## 2021-04-05 NOTE — Progress Notes (Addendum)
Progress Note  Patient Name: Matthew [name] Date of Encounter: 04/05/2021  Primary Cardiologist: Larae Grooms, MD   Subjective   Patient just finished working with PT.  He appears comfortable in no acute respiratory distress.  Denies any chest pain or shortness of breath.  He reports urinating adequately but only fill his urinal halfway.  Endorses discomfort with urination.  Inpatient Medications    Scheduled Meds:  aspirin EC  81 mg Oral Daily   atorvastatin  10 mg Oral Daily   carvedilol  25 mg Oral BID WC   enoxaparin (LOVENOX) injection  40 mg Subcutaneous Q24H   sodium chloride flush  3 mL Intravenous Q12H   spironolactone  25 mg Oral Daily   Continuous Infusions:  sodium chloride     PRN Meds: sodium chloride, acetaminophen, sodium chloride flush   Vital Signs    Vitals:   04/04/21 2116 04/04/21 2117 04/05/21 0100 04/05/21 0742  BP: 112/86   108/88  Pulse: 77 76  90  Resp: (!) 22 (!) 23  (!) 22  Temp:    (!) 97.4 F (36.3 C)  TempSrc:    Oral  SpO2: 100% 98%  98%  Weight:   115.2 kg   Height:        Intake/Output Summary (Last 24 hours) at 04/05/2021 0808 Last data filed at 04/05/2021 0540 Gross per 24 hour  Intake 720 ml  Output 600 ml  Net 120 ml   Filed Weights   04/04/21 1744 04/05/21 0100  Weight: 114.7 kg 115.2 kg    Telemetry    Normal sinus rhythm.  High burdens of PVCs- Personally Reviewed  ECG    2/20: Sinus rhythm, PVC, ventricular bigeminy, bifascicular block- Personally Reviewed  Physical Exam   GEN: No acute distress.   Neck: Positive JVD Cardiac: RRR, no murmurs, rubs, or gallops.  Respiratory: Diminished breath sounds at bilateral lung bases, right worse than left. GI: Soft, nontender, non-distended  MS: +2 edema of bilateral lower extremities.  He feet are cooler but still warm to touch Neuro:  Nonfocal  Psych: Normal affect   Labs    Chemistry Recent Labs  Lab 04/04/21 1251 04/05/21 0209  NA 136 138  K  3.8 4.0  CL 107 106  CO2 19* 21*  GLUCOSE 88 106*  BUN 23 25*  CREATININE 1.60* 1.70*  CALCIUM 8.7* 8.8*  PROT 7.0  --   ALBUMIN 3.0*  --   AST 20  --   ALT 15  --   ALKPHOS 95  --   BILITOT 0.9  --   GFRNONAA 43* 40*  ANIONGAP 10 11     Hematology Recent Labs  Lab 04/04/21 1251 04/05/21 0209  WBC 5.7 6.4  RBC 4.69 4.60  HGB 11.7* 11.5*  HCT 36.9* 36.7*  MCV 78.7* 79.8*  MCH 24.9* 25.0*  MCHC 31.7 31.3  RDW 19.8* 20.0*  PLT 116* 122*    Cardiac EnzymesNo results for input(s): TROPONINI in the last 168 hours. No results for input(s): TROPIPOC in the last 168 hours.   BNP Recent Labs  Lab 04/04/21 1251  BNP >4,500.0*     DDimer No results for input(s): DDIMER in the last 168 hours.   Radiology    CT Head Wo Contrast  Result Date: 04/04/2021 CLINICAL DATA:  Dizziness. EXAM: CT HEAD WITHOUT CONTRAST TECHNIQUE: Contiguous axial images were obtained from the base of the skull through the vertex without intravenous contrast. RADIATION DOSE REDUCTION: This exam  was performed according to the departmental dose-optimization program which includes automated exposure control, adjustment of the mA and/or kV according to patient size and/or use of iterative reconstruction technique. COMPARISON:  None. FINDINGS: Brain: There is no evidence of an acute infarct, intracranial hemorrhage, mass, midline shift, or extra-axial fluid collection. Mild cerebral atrophy is within normal limits for age. Hypodensities in the cerebral white matter are nonspecific but compatible with minimal chronic small vessel ischemic disease. There are 2 punctate calcifications in the pons. Vascular: Calcified atherosclerosis at the skull base. No hyperdense vessel. Skull: No acute fracture or suspicious osseous lesion. Sinuses/Orbits: Complete opacification of the left frontal sinus with evidence of chronic sinusitis. Partial left anterior ethmoid air cell opacification. Mucosal thickening and fluid in the  left maxillary sinus. Clear mastoid air cells. Unremarkable orbits. Other: None. IMPRESSION: 1. No evidence of acute intracranial abnormality. 2. Chronic left frontal sinusitis. Left maxillary sinus fluid, correlate for acute sinusitis. Electronically Signed   By: Logan Bores M.D.   On: 04/04/2021 12:15   DG Chest Port 1 View  Result Date: 04/04/2021 CLINICAL DATA:  Shortness of breath EXAM: PORTABLE CHEST 1 VIEW COMPARISON:  Radiograph 01/11/2017 FINDINGS: Unchanged, enlarged cardiac silhouette. Central pulmonary vascular congestion. Mild edema. There is a moderate size layering right pleural effusion with adjacent basilar opacities. Skin fold overlies the left upper chest. No visible pneumothorax. Bilateral shoulder osteoarthritis with findings of probable rotator cuff disease. IMPRESSION: Cardiomegaly with mild pulmonary edema. Moderate layering right pleural effusion with adjacent basilar opacities, likely atelectasis. Electronically Signed   By: Maurine Simmering M.D.   On: 04/04/2021 11:18   ECHOCARDIOGRAM COMPLETE  Result Date: 04/04/2021    ECHOCARDIOGRAM REPORT   Patient Name:   Matthew [name] Date of Exam: 04/04/2021 Medical Rec #:  [id number]        Height:       78.0 in Accession #:    [id number]       Weight:       231.0 lb Date of Birth:  1941/02/10        BSA:          2.400 m Patient Age:    81 years         BP:           132/111 mmHg Patient Gender: M                HR:           83 bpm. Exam Location:  Inpatient Procedure: 2D Echo Indications:    congestive heart failure  History:        Patient has prior history of Echocardiogram examinations, most                 recent 05/10/2020. Cardiomyopathy and CHF, Abnormal ECG; Risk                 Factors:Hypertension and Dyslipidemia.  Sonographer:    Johny Chess RDCS Referring Phys: [place]  1. Left ventricular ejection fraction, by estimation, is 20 to 25%. The left ventricle has severely decreased function. The  left ventricle demonstrates global hypokinesis. The left ventricular internal cavity size was moderately to severely dilated. There is moderate left ventricular hypertrophy. Left ventricular diastolic parameters are indeterminate.  2. Right ventricular systolic function is severely reduced. The right ventricular size is severely enlarged. There is moderately elevated pulmonary artery systolic pressure.  3. Left atrial size was severely dilated.  4.  Right atrial size was severely dilated.  5. The mitral valve is grossly normal. Severe mitral valve regurgitation.  6. Tricuspid valve regurgitation is severe.  7. The aortic valve is grossly normal. Aortic valve regurgitation is trivial. No aortic stenosis is present. FINDINGS  Left Ventricle: Left ventricular ejection fraction, by estimation, is 20 to 25%. The left ventricle has severely decreased function. The left ventricle demonstrates global hypokinesis. The left ventricular internal cavity size was moderately to severely  dilated. There is moderate left ventricular hypertrophy. Left ventricular diastolic parameters are indeterminate. Right Ventricle: The right ventricular size is severely enlarged. Right vetricular wall thickness was not well visualized. Right ventricular systolic function is severely reduced. There is moderately elevated pulmonary artery systolic pressure. The tricuspid regurgitant velocity is 2.96 m/s, and with an assumed right atrial pressure of 15 mmHg, the estimated right ventricular systolic pressure is Q000111Q mmHg. Left Atrium: Left atrial size was severely dilated. Right Atrium: Right atrial size was severely dilated. Pericardium: There is no evidence of pericardial effusion. Mitral Valve: The mitral valve is grossly normal. Severe mitral valve regurgitation. Tricuspid Valve: The tricuspid valve is grossly normal. Tricuspid valve regurgitation is severe. Aortic Valve: The aortic valve is grossly normal. Aortic valve regurgitation is trivial.  No aortic stenosis is present. Pulmonic Valve: The pulmonic valve was grossly normal. Pulmonic valve regurgitation is mild. Aorta: The aortic root and ascending aorta are structurally normal, with no evidence of dilitation. IAS/Shunts: The atrial septum is grossly normal.  LEFT VENTRICLE PLAX 2D LVIDd:         7.10 cm LVIDs:         6.10 cm LV PW:         1.30 cm LV IVS:        1.00 cm LVOT diam:     2.10 cm LVOT Area:     3.46 cm  IVC IVC diam: 3.20 cm LEFT ATRIUM              Index        RIGHT ATRIUM           Index LA diam:        5.50 cm  2.29 cm/m   RA Area:     32.10 cm LA Vol (A2C):   150.0 ml 62.50 ml/m  RA Volume:   121.00 ml 50.41 ml/m LA Vol (A4C):   143.0 ml 59.58 ml/m LA Biplane Vol: 154.0 ml 64.16 ml/m   AORTA Ao Root diam: 3.90 cm Ao Asc diam:  3.80 cm TRICUSPID VALVE TR Peak grad:   35.0 mmHg TR Vmax:        296.00 cm/s  SHUNTS Systemic Diam: 2.10 cm Mertie Moores MD Electronically signed by Mertie Moores MD Signature Date/Time: 04/04/2021/5:16:15 PM    Final     Cardiac Studies   TTE 04/05/21 IMPRESSIONS    1. Left ventricular ejection fraction, by estimation, is 20 to 25%. The  left ventricle has severely decreased function. The left ventricle  demonstrates global hypokinesis. The left ventricular internal cavity size  was moderately to severely dilated.  There is moderate left ventricular hypertrophy. Left ventricular diastolic  parameters are indeterminate.   2. Right ventricular systolic function is severely reduced. The right  ventricular size is severely enlarged. There is moderately elevated  pulmonary artery systolic pressure.   3. Left atrial size was severely dilated.   4. Right atrial size was severely dilated.   5. The mitral valve is grossly normal. Severe mitral  valve regurgitation.   6. Tricuspid valve regurgitation is severe.   7. The aortic valve is grossly normal. Aortic valve regurgitation is  trivial. No aortic stenosis is present.   Patient Profile      81 y.o. male with past medical history of hypertension, CAD, HFrEF, hyperlipidemia who presents to the hospital for dizziness and worsening LE edema, found to be in heart failure exacerbation 2/2 medications nonadherence  Assessment & Plan    Acute on chronic HFrEF Hypertensive cardiomyopathy CAD In the setting of medication nonadherence.  Repeat echocardiogram showed persistent severe biventricular dysfunction, which is worse than his echo in March 2022. His urine output is minimal overnight with IV Lasix 40 BID.  No retention on bladder scan.  His weight is up ~ 15 lbs from his dry weight. (Dry Wt 236 lbs). Will put him on Lasix drip.  His blood pressure improved which helps reduce afterload.  No signs of cardiogenic shock.   -Lasix gtt 5 mg/h.  Monitor electrolytes closely. -Can also add dobutamine to help with perfusion -Continue Coreg and Arlyce Harman -Continue aspirin   AKI  Presumed cardiorenal in the setting of volume overload.  Kidney function stable today. -Continue diuresis -Holding Entresto and Farxiga -Obtain a UA for his dysuria   Hyperlipidemia LDL 44 in March -Continue Lipitor 10 mg  For questions or updates, please contact [facility] Please consult [email or web address] for contact info under Cardiology/STEMI.      Signed, Gaylan Gerold, DO  04/05/2021, 8:08 AM      ATTENDING ATTESTATION:  After conducting a review of all available clinical information with the care team, interviewing the patient, and performing a physical exam, I agree with the findings and plan described in this note.   GEN: No acute distress.   Cardiac: RRR, no murmurs, rubs, or gallops.  Positive JVD Respiratory: Coarse bilaterally GI: Soft, nontender, non-distended  MS: Legs and wraps; no deformity. Neuro:  Nonfocal  Vasc:  +2 radial pulses  Patient remains overtly volume overloaded.  He did not diurese very well yesterday.  Will place on Lasix drip at 5 mg an hour.  We will give metolazone  2.5 mg.  Creatinine is up mildly to 1.7.  If still increasing tomorrow we will start low-dose inotrope being cognizant of PVC burden.  Keep K and mag stable.  Blood pressure is reasonable as patient is fairly well afterload reduced.  Blood pressure of 90 to 110 mmHg would be optimal.  Lenna Sciara, MD Pager [phone]

## 2021-04-05 NOTE — Progress Notes (Addendum)
Family Medicine Teaching Service Daily Progress Note Intern Pager: [phone]  Patient name: Matthew [name] Medical record number: [id number] Date of birth: 02-13-1941 Age: 81 y.o. Gender: male  Primary Care Provider: Pcp, No Consultants: Cards Code Status: Full  Pt Overview and Major Events to Date:  2/20- admitted  Assessment and Plan: Matthew [name] is a 81 y.o. male presenting with two-three weeks of increasing leg swelling and dyspnea on exertion in the setting of non-adherence to his HF meds. PMH is significant for HFrEF (EF 25-30%), hypertension, Osteoarthritis of the bilateral knees.   HFrEF Exacerbation Vitals stable overnight. Pt remains comfortable on RA. Only 631mL UOP overnight despite receiving IV Lasix 40 mg. Cr bumped slightly 1.60>1.70. K stable at 4.0. Cardiology saw him yesterday and discussed the possibility of ICD for primary prevention, but note that he will need to have trialed medical therapy first. They also made mention of possibly transitioning to Lasix gtt today if UOP inadequate.  - Cardiology following, appreciate their assistance -May require Lasix gtt or addition of bumetanide, will discuss with cardiology - Continue Coreg and Arlyce Harman - Continue ASA - Holding Jardiance and Hollis with bumped Creatinine - Elevated BLE, ACE wraps  Onychogryphosis Patient with marked onychogryphosis that would interfere with our ability to put compression stockings on him.  - Will ask podiatry to perform nail care so that we can get him in compression stockings for fluid retention/HFrEF as above  AKI Cr 1.60>1.70. - Continue diuresis - Monitor on BMP  Microcytic anemia Hgb stable at 11.5. MCV 79.8. Iron low at 22, Sats low at 7%, TIBC wnl at 308, ferritin wnl at 110. Picture is consistent with an anemia of chronic disease.   FEN/GI:  PPx: Lovenox Dispo:Pending PT recommendations  in 3 or more days. Barriers include diuresis.   Subjective:  Matthew [name] reports  feeling generally well today.  He says that he did pee a lot last night and that his nurse measured all of his urine output.  He believes that the 600 mL UOP that is charted is correct.  The nurse corroborates this. He has no acute complaints this morning Objective: Temp:  [97.4 F (36.3 C)-98.1 F (36.7 C)] 98.1 F (36.7 C) (02/20 1952) Pulse Rate:  [30-156] 76 (02/20 2117) Resp:  [17-30] 23 (02/20 2117) BP: (93-143)/(69-111) 112/86 (02/20 2116) SpO2:  [94 %-100 %] 98 % (02/20 2117) Weight:  [114.7 kg-115.2 kg] 115.2 kg (02/21 0100) Physical Exam: General: Tall, well-nourished gentleman, sitting up eating breakfast Cardiovascular: 3/6 systolic murmur, prominent S3, + JVD Respiratory: Diminished lung sounds at right base, normal work of breathing on room air Extremities: 3+ pitting edema to knees bilaterally, stable from my previous exam  Laboratory: Recent Labs  Lab 04/04/21 1251 04/05/21 0209  WBC 5.7 6.4  HGB 11.7* 11.5*  HCT 36.9* 36.7*  PLT 116* 122*   Recent Labs  Lab 04/04/21 1251 04/05/21 0209  NA 136 138  K 3.8 4.0  CL 107 106  CO2 19* 21*  BUN 23 25*  CREATININE 1.60* 1.70*  CALCIUM 8.7* 8.8*  PROT 7.0  --   BILITOT 0.9  --   ALKPHOS 95  --   ALT 15  --   AST 20  --   GLUCOSE 88 106*    Imaging/Diagnostic Tests: ECHOCARDIOGRAM COMPLETE    ECHOCARDIOGRAM REPORT       Patient Name:   Matthew [name] Date of Exam: 04/04/2021 Medical Rec #:  [id number]  Height:       78.0 in Accession #:    [id number]       Weight:       231.0 lb Date of Birth:  [birth year]        BSA:          2.400 m Patient Age:    69 years         BP:           132/111 mmHg Patient Gender: M                HR:           83 bpm. Exam Location:  Inpatient  Procedure: 2D Echo  Indications:    congestive heart failure   History:        Patient has prior history of Echocardiogram examinations, most                 recent 05/10/2020. Cardiomyopathy and CHF, Abnormal ECG; Risk                  Factors:Hypertension and Dyslipidemia.   Sonographer:    Johny Chess RDCS Referring Phys: [place]   1. Left ventricular ejection fraction, by estimation, is 20 to 25%. The left ventricle has severely decreased function. The left ventricle demonstrates global hypokinesis. The left ventricular internal cavity size was moderately to severely dilated.  There is moderate left ventricular hypertrophy. Left ventricular diastolic parameters are indeterminate.  2. Right ventricular systolic function is severely reduced. The right ventricular size is severely enlarged. There is moderately elevated pulmonary artery systolic pressure.  3. Left atrial size was severely dilated.  4. Right atrial size was severely dilated.  5. The mitral valve is grossly normal. Severe mitral valve regurgitation.  6. Tricuspid valve regurgitation is severe.  7. The aortic valve is grossly normal. Aortic valve regurgitation is trivial. No aortic stenosis is present.  FINDINGS  Left Ventricle: Left ventricular ejection fraction, by estimation, is 20 to 25%. The left ventricle has severely decreased function. The left ventricle demonstrates global hypokinesis. The left ventricular internal cavity size was moderately to severely  dilated. There is moderate left ventricular hypertrophy. Left ventricular diastolic parameters are indeterminate.  Right Ventricle: The right ventricular size is severely enlarged. Right vetricular wall thickness was not well visualized. Right ventricular systolic function is severely reduced. There is moderately elevated pulmonary artery systolic pressure. The  tricuspid regurgitant velocity is 2.96 m/s, and with an assumed right atrial pressure of 15 mmHg, the estimated right ventricular systolic pressure is Q000111Q mmHg.  Left Atrium: Left atrial size was severely dilated.  Right Atrium: Right atrial size was severely dilated.  Pericardium: There  is no evidence of pericardial effusion.  Mitral Valve: The mitral valve is grossly normal. Severe mitral valve regurgitation.  Tricuspid Valve: The tricuspid valve is grossly normal. Tricuspid valve regurgitation is severe.  Aortic Valve: The aortic valve is grossly normal. Aortic valve regurgitation is trivial. No aortic stenosis is present.  Pulmonic Valve: The pulmonic valve was grossly normal. Pulmonic valve regurgitation is mild.  Aorta: The aortic root and ascending aorta are structurally normal, with no evidence of dilitation.  IAS/Shunts: The atrial septum is grossly normal.    LEFT VENTRICLE PLAX 2D LVIDd:         7.10 cm LVIDs:         6.10 cm LV PW:         1.30 cm  LV IVS:        1.00 cm LVOT diam:     2.10 cm LVOT Area:     3.46 cm    IVC IVC diam: 3.20 cm  LEFT ATRIUM              Index        RIGHT ATRIUM           Index LA diam:        5.50 cm  2.29 cm/m   RA Area:     32.10 cm LA Vol (A2C):   150.0 ml 62.50 ml/m  RA Volume:   121.00 ml 50.41 ml/m LA Vol (A4C):   143.0 ml 59.58 ml/m LA Biplane Vol: 154.0 ml 64.16 ml/m    AORTA Ao Root diam: 3.90 cm Ao Asc diam:  3.80 cm  TRICUSPID VALVE TR Peak grad:   35.0 mmHg TR Vmax:        296.00 cm/s   SHUNTS Systemic Diam: 2.10 cm  Mertie Moores MD Electronically signed by Mertie Moores MD Signature Date/Time: 04/04/2021/5:16:15 PM      Final   CT Head Wo Contrast CLINICAL DATA:  Dizziness.  EXAM: CT HEAD WITHOUT CONTRAST  TECHNIQUE: Contiguous axial images were obtained from the base of the skull through the vertex without intravenous contrast.  RADIATION DOSE REDUCTION: This exam was performed according to the departmental dose-optimization program which includes automated exposure control, adjustment of the mA and/or kV according to patient size and/or use of iterative reconstruction technique.  COMPARISON:  None.  FINDINGS: Brain: There is no evidence of an acute infarct,  intracranial hemorrhage, mass, midline shift, or extra-axial fluid collection. Mild cerebral atrophy is within normal limits for age. Hypodensities in the cerebral white matter are nonspecific but compatible with minimal chronic small vessel ischemic disease. There are 2 punctate calcifications in the pons.  Vascular: Calcified atherosclerosis at the skull base. No hyperdense vessel.  Skull: No acute fracture or suspicious osseous lesion.  Sinuses/Orbits: Complete opacification of the left frontal sinus with evidence of chronic sinusitis. Partial left anterior ethmoid air cell opacification. Mucosal thickening and fluid in the left maxillary sinus. Clear mastoid air cells. Unremarkable orbits.  Other: None.  IMPRESSION: 1. No evidence of acute intracranial abnormality. 2. Chronic left frontal sinusitis. Left maxillary sinus fluid, correlate for acute sinusitis.  Electronically Signed   By: Logan Bores M.D.   On: 04/04/2021 12:15 DG Chest Port 1 View CLINICAL DATA:  Shortness of breath  EXAM: PORTABLE CHEST 1 VIEW  COMPARISON:  Radiograph 01/11/2017  FINDINGS: Unchanged, enlarged cardiac silhouette. Central pulmonary vascular congestion. Mild edema. There is a moderate size layering right pleural effusion with adjacent basilar opacities. Skin fold overlies the left upper chest. No visible pneumothorax. Bilateral shoulder osteoarthritis with findings of probable rotator cuff disease.  IMPRESSION: Cardiomegaly with mild pulmonary edema. Moderate layering right pleural effusion with adjacent basilar opacities, likely atelectasis.  Electronically Signed   By: Maurine Simmering M.D.   On: 04/04/2021 11:18    Eppie Gibson, MD 04/05/2021, 6:25 AM PGY-1, [place] Intern pager: [phone], text pages welcome

## 2021-04-05 NOTE — Progress Notes (Signed)
Heart Failure Stewardship Pharmacist Progress Note   PCP: Pcp, No PCP-Cardiologist: Larae Grooms, MD    HPI:  81 yo M with PMH of HTN, CAD, HFrEF, and HLD. He presented to the ED on 2/20 with dizziness, weight gain, dyspnea on exertion, and LE edema. CXR with mild pulmonary edema. An ECHO was done on 2/20 and LVEF is 20-25% with severely reduced RV systolic function. Last ECHO from 04/2020 with LVEF 25-30% and mildly reduced RV. Notable for medication noncompliance.   Current HF Medications: Diuretic: furosemide IV 5mg /hr Beta Blocker: carvedilol 25 mg BID Aldosterone Antagonist: spironolactone 25 mg daily  Prior to admission HF Medications: Diuretic: furosemide 20 mg every M/F Beta blocker: carvedilol 25 mg BID ACE/ARB/ARNI: Entresto 97/103 mg BID Aldosterone Antagonist: spironolactone 25 mg daily SGLT2i: Farxiga 10 mg daily  Pertinent Lab Values: Serum creatinine 1.70, BUN 25, Potassium 4.0, Sodium 138, BNP >4500, Magnesium 2.0   Vital Signs: Weight: 253 lbs (admission weight: 252 lbs) Blood pressure: 110/80s  Heart rate: 80s  I/O: -576mL yesterday; net +526mL  Medication Assistance / Insurance Benefits Check: Does the patient have prescription insurance?  Yes Type of insurance plan: [employer]  Outpatient Pharmacy:  Prior to admission outpatient pharmacy: [facility] Is the patient willing to use [facility] at discharge? Yes Is the patient willing to transition their outpatient pharmacy to utilize a [facility] outpatient pharmacy?   Pending    Assessment: 1. Acute on chronic systolic CHF (EF 0000000), due to presumed NICM - no R/LHC completed. NYHA class IV symptoms. - Agree increasing to furosemide IV 5 mg/hr - minimal output and weight gain with 40 mg IV dosing - Carvedilol 25 mg BID continued - ok to continue as long as he does not show signs of shock. Vitals ok and renal function bumped slightly today - Entresto and Farxiga on hold with renal function  and dizziness - Continue spironolactone 25 mg daily   Plan: 1) Medication changes recommended at this time: - May require metolazone if he continues to have minimal output on high dose lasix - If needed, can obtain PICC/coox monitoring if concerned for low output HF  2) Patient assistance: - Investigate reason for medication noncompliance   3)  Education  - To be completed prior to discharge  Kerby Nora, PharmD, BCPS Heart Failure Stewardship Pharmacist Phone [phone]

## 2021-04-05 NOTE — TOC Progression Note (Signed)
Transition of Care [facility]) - Progression Note    Patient Details  Name: Matthew [name] MRN: [id number] Date of Birth: [birth year]  Transition of Care [facility]) CM/SW Contact  Zenon Mayo, RN Phone Number: 04/05/2021, 4:47 PM  Clinical Narrative:    from home with grand daughter (HPOA) CHF ex, swelling in LE, lasix iv , uses [place] at baseline, TOC will continue to follow for dc needs.        Expected Discharge Plan and Services                                                 Social Determinants of Health (SDOH) Interventions Food Insecurity Interventions: Intervention Not Indicated Financial Strain Interventions: Intervention Not Indicated Housing Interventions: Intervention Not Indicated Transportation Interventions: Intervention Not Indicated  Readmission Risk Interventions No flowsheet data found.

## 2021-04-05 NOTE — Evaluation (Signed)
Occupational Therapy Evaluation Patient Details Name: Matthew [name] MRN: [id number] DOB: 02-18-1940 Today's Date: 04/05/2021   History of Present Illness 81 y.o. male with past medical history of hypertension, CAD, HFrEF, hyperlipidemia who presents to the hospital for dizziness and worsening LE edema, found to be in heart failure exacerbation.   Clinical Impression   Pt admitted as above, presenting with deficits in ability to perform ADL's at this time. He is currently Mod A for LB ADL's and min-mod A for functional transfers using a 4 wheeled RW. Edema in LE's is a limiting factor for LB ADL's. Per his report, he should have family PRN assist at home at d/c and should benefit from acute OT followed by [facility] to progress pt to anticipated Mod I ADL's for bathing and dressing, functional mobility, transfers and energy conservation techniques.      Recommendations for follow up therapy are one component of a multi-disciplinary discharge planning process, led by the attending physician.  Recommendations may be updated based on patient status, additional functional criteria and insurance authorization.   Follow Up Recommendations  Home health OT    Assistance Recommended at Discharge Intermittent Supervision/Assistance  Patient can return home with the following A little help with walking and/or transfers;A little help with bathing/dressing/bathroom;Assistance with cooking/housework    Functional Status Assessment  Patient has had a recent decline in their functional status and demonstrates the ability to make significant improvements in function in a reasonable and predictable amount of time.  Equipment Recommendations  Other (comment) (Continue to assess in functional setting)    Recommendations for Other Services PT consult     Precautions / Restrictions   Fall      Mobility Bed Mobility   General bed mobility comments: sitting up at EOB upon OT arrival    Transfers Overall  transfer level: Needs assistance Equipment used: Rollator (4 wheels) Transfers: Sit to/from Stand Sit to Stand: Mod assist       General transfer comment: vc's for sdafety and sequencing      Balance Overall balance assessment: Mild deficits observed, not formally tested  Pt ambulates with 4 wheeled RW and trunk flexed forward.       ADL either performed or assessed with clinical judgement   ADL Overall ADL's : Needs assistance/impaired Eating/Feeding: Set up;Sitting   Grooming: Wash/dry hands;Wash/dry face;Set up;Sitting   Upper Body Bathing: Sitting;Minimal assistance   Lower Body Bathing: Moderate assistance;Sitting/lateral leans;Sit to/from stand   Upper Body Dressing : Minimal assistance;Sitting   Lower Body Dressing: Moderate assistance;Sitting/lateral leans;Sit to/from stand;Cueing for safety;Cueing for sequencing   Toilet Transfer: Rollator (4 wheels);Moderate assistance;Cueing for safety;Cueing for sequencing   Toileting- Clothing Manipulation and Hygiene: Minimal assistance;Cueing for sequencing;Cueing for safety;Sit to/from stand;Sitting/lateral lean       Functional mobility during ADLs: Moderate assistance;Cueing for safety;Cueing for sequencing;Rollator (4 wheels) General ADL Comments: Pt ambulates flexed at his trunk using bari rollator states "I can't stand up taller, this is how I walk"     Vision Baseline Vision/History: 1 Wears glasses Patient Visual Report: No change from baseline       Perception   Praxis    Pertinent Vitals/Pain Pain Assessment Pain Assessment: No/denies pain     Hand Dominance Right   Extremity/Trunk Assessment Upper Extremity Assessment Upper Extremity Assessment: Overall WFL for tasks assessed   Lower Extremity Assessment Lower Extremity Assessment: Defer to PT evaluation       Communication Communication Communication: [facility]   Cognition Arousal/Alertness: Awake/alert Behavior  During Therapy: WFL for tasks  assessed/performed (HOH) Overall Cognitive Status: Within Functional Limits for tasks assessed      General Comments  Increased edema in bilateral LE's, requires Mod Assist for LB ADL's (don shoes) R > L            Home Living Family/patient expects to be discharged to:: Private residence Living Arrangements: Children;Other (Comment) (Daughter and son in Social worker) Available Help at Discharge: Family Type of Home: House Home Access: Ramped entrance   Entrance Stairs-Rails: Can reach both Home Layout: One level     Bathroom Shower/Tub: Producer, television/film/video: Standard Bathroom Accessibility: Yes How Accessible: Accessible via Vides Home Equipment: Rollator (4 wheels)          Prior Functioning/Environment Prior Level of Function : Independent/Modified Independent               ADLs Comments: Pt uses 4 wheeled RW at home, reports that he is Mod I bathing and dressing however, no family available to confirm. Family cooks/preps meals.        OT Problem List: Increased edema;Obesity;Decreased activity tolerance;Cardiopulmonary status limiting activity;Impaired balance (sitting and/or standing)      OT Treatment/Interventions: Self-care/ADL training;Patient/family education;Energy conservation;Therapeutic activities    OT Goals(Current goals can be found in the care plan section) Acute Rehab OT Goals Patient Stated Goal: Get well, therapy at home OT Goal Formulation: With patient Time For Goal Achievement: 05/03/21 Potential to Achieve Goals: Good  OT Frequency: Min 2X/week    Co-evaluation              AM-PAC OT "6 Clicks" Daily Activity     Outcome Measure Help from another person eating meals?: None Help from another person taking care of personal grooming?: A Little Help from another person toileting, which includes using toliet, bedpan, or urinal?: A Little Help from another person bathing (including washing, rinsing, drying)?: A Little Help  from another person to put on and taking off regular upper body clothing?: None Help from another person to put on and taking off regular lower body clothing?: A Lot 6 Click Score: 19   End of Session Equipment Utilized During Treatment: Rollator (4 wheels) Nurse Communication: Mobility status  Activity Tolerance: Patient tolerated treatment well Patient left: in chair;with chair alarm set  OT Visit Diagnosis: Muscle weakness (generalized) (M62.81);Unsteadiness on feet (R26.81)                Time: 8891-6945 OT Time Calculation (min): 41 min Charges:  OT General Charges $OT Visit: 1 Visit OT Evaluation $OT Eval Low Complexity: 1 Low OT Treatments $Self Care/Home Management : 8-22 mins  Mafalda Mcginniss Beth Dixon, OTR/L 04/05/2021, 9:13 AM

## 2021-04-05 NOTE — Progress Notes (Addendum)
Heart Failure Nurse Navigator Progress Note  PCP: Pcp, No PCP-Cardiologist: Lendell Caprice., MD Admission Diagnosis: A CHF Admitted from: home with granddaughter  Presentation:   Matthew [name] presented 2/20 with increased SOB and BLE edema. Pt resting in recliner with legs elevated on room air.  Pt having frequent PVCs and bigeminy. BP 108/88 (95). Pt extremities cool to touch, dry. Requesting additional blankets--placed over shoulders. Wounds present on RLE, red/pink, yellow, dry. BLE 3+ pitting edema, pt c/o bilateral foot pain, feet very swollen/puffy. Weight increased overnight.  Pt states he does not smoke, no alcohol, no illicit drug use. Pt owns his home, granddaughter lives with him. States he has been able to bathe and dress independently. Family helps with cooking. He states he drives locally. Pt wears glasses. A bit HOH. States he is taking his medication as prescribed and does not miss any doses. According to previous notes, pt has been out of his HF medications for a week. Pt states he ambulates at home.  Explained benefits of Heart & Vascular Transitions of Care Clinic appointment, patient agreeable.    ECHO/ LVEF: 20-25%  Clinical Course:  Past Medical History:  Diagnosis Date   CAD (coronary artery disease)    Cardiomyopathy ([place])    Heart failure (HCC)    HTN (hypertension)    Obesity    Osteoarthritis      Social History   Socioeconomic History   Marital status: Married    Spouse name: Not on file   Number of children: Not on file   Years of education: Not on file   Highest education level: 11th grade  Occupational History   Occupation: retired  Tobacco Use   Smoking status: Never   Smokeless tobacco: Never  Vaping Use   Vaping Use: Never used  Substance and Sexual Activity   Alcohol use: No   Drug use: No   Sexual activity: Not on file  Other Topics Concern   Not on file  Social History Narrative   Not on file   Social Determinants of Health    Financial Resource Strain: Low Risk    Difficulty of Paying Living Expenses: Not very hard  Food Insecurity: No Food Insecurity   Worried About Charity fundraiser in the Last Year: Never true   [place] in the Last Year: Never true  Transportation Needs: No Transportation Needs   Lack of Transportation (Medical): No   Lack of Transportation (Non-Medical): No  Physical Activity: Not on file  Stress: Not on file  Social Connections: Not on file    High Risk Criteria for Readmission and/or Poor Patient Outcomes: Heart failure hospital admissions (last 6 months): 1  No Show rate: 3% Difficult social situation: no Demonstrates medication adherence: no Primary Language: English Literacy level: able to read/write and comprehend. Wears glasses.  Education Assessment and Provision:  Detailed education and instructions provided on heart failure disease management including the following:  Signs and symptoms of Heart Failure When to call the physician Importance of daily weights Low sodium diet Fluid restriction Medication management Anticipated future follow-up appointments  Patient education given on each of the above topics.  Patient acknowledges understanding via teach back method and acceptance of all instructions.  Education Materials:  "Living Better With Heart Failure" Booklet, HF zone tool, & Daily Weight Tracker Tool.  Patient has scale at home: yes Patient has pill box at home: yes   Barriers of Care:   -med compliance -dietary indiscretions  Considerations/Referrals:   Referral made to Heart Failure Pharmacist Stewardship: yes, appreciated Referral made to Heart Failure CSW/NCM TOC: no Referral made to Heart & Vascular TOC clinic: yes, Mon Mar 6 @ 11AM  Items for Follow-up on DC/TOC: -optimize -medication compliance -dietary/fluid modification   Pricilla Holm, MSN, RN Heart Failure Nurse Navigator [phone]

## 2021-04-06 ENCOUNTER — Other Ambulatory Visit (HOSPITAL_COMMUNITY): Payer: Self-pay

## 2021-04-06 DIAGNOSIS — I5023 Acute on chronic systolic (congestive) heart failure: Secondary | ICD-10-CM

## 2021-04-06 DIAGNOSIS — B351 Tinea unguium: Secondary | ICD-10-CM | POA: Diagnosis not present

## 2021-04-06 LAB — BASIC METABOLIC PANEL
Anion gap: 13 (ref 5–15)
Anion gap: 9 (ref 5–15)
BUN: 26 mg/dL — ABNORMAL HIGH (ref 8–23)
BUN: 28 mg/dL — ABNORMAL HIGH (ref 8–23)
CO2: 20 mmol/L — ABNORMAL LOW (ref 22–32)
CO2: 21 mmol/L — ABNORMAL LOW (ref 22–32)
Calcium: 8.9 mg/dL (ref 8.9–10.3)
Calcium: 8.8 mg/dL — ABNORMAL LOW (ref 8.9–10.3)
Chloride: 104 mmol/L (ref 98–111)
Chloride: 107 mmol/L (ref 98–111)
Creatinine, Ser: 1.71 mg/dL — ABNORMAL HIGH (ref 0.61–1.24)
Creatinine, Ser: 1.72 mg/dL — ABNORMAL HIGH (ref 0.61–1.24)
GFR, Estimated: 40 mL/min — ABNORMAL LOW (ref >60)
GFR, Estimated: 40 mL/min — ABNORMAL LOW (ref >60)
Glucose, Bld: 111 mg/dL — ABNORMAL HIGH (ref 70–99)
Glucose, Bld: 104 mg/dL — ABNORMAL HIGH (ref 70–99)
Potassium: 4.2 mmol/L (ref 3.5–5.1)
Potassium: 4.5 mmol/L (ref 3.5–5.1)
Sodium: 137 mmol/L (ref 135–145)
Sodium: 137 mmol/L (ref 135–145)

## 2021-04-06 LAB — CBC
HCT: 37.1 % — ABNORMAL LOW (ref 39.0–52.0)
Hemoglobin: 11.7 g/dL — ABNORMAL LOW (ref 13.0–17.0)
MCH: 24.5 pg — ABNORMAL LOW (ref 26.0–34.0)
MCHC: 31.5 g/dL (ref 30.0–36.0)
MCV: 77.8 fL — ABNORMAL LOW (ref 80.0–100.0)
Platelets: 129 10*3/uL — ABNORMAL LOW (ref 150–400)
RBC: 4.77 MIL/uL (ref 4.22–5.81)
RDW: 19.6 % — ABNORMAL HIGH (ref 11.5–15.5)
WBC: 6.1 10*3/uL (ref 4.0–10.5)
nRBC: 0 % (ref 0.0–0.2)

## 2021-04-06 LAB — MAGNESIUM: Magnesium: 1.9 mg/dL (ref 1.7–2.4)

## 2021-04-06 MED ORDER — MAGNESIUM SULFATE 2 GM/50ML IV SOLN
2.0000 g | Freq: Once | INTRAVENOUS | Status: AC
  Administered 2021-04-06: 2 g via INTRAVENOUS
  Filled 2021-04-06: qty 50

## 2021-04-06 MED ORDER — DAPAGLIFLOZIN PROPANEDIOL 5 MG PO TABS
10.0000 mg | ORAL_TABLET | Freq: Every day | ORAL | Status: DC
  Administered 2021-04-06 – 2021-04-08 (×3): 10 mg via ORAL
  Filled 2021-04-06 (×3): qty 2

## 2021-04-06 MED ORDER — METOLAZONE 2.5 MG PO TABS
2.5000 mg | ORAL_TABLET | Freq: Two times a day (BID) | ORAL | Status: DC
  Administered 2021-04-06: 2.5 mg via ORAL
  Filled 2021-04-06: qty 1

## 2021-04-06 MED ORDER — FOSFOMYCIN TROMETHAMINE 3 G PO PACK
3.0000 g | PACK | Freq: Once | ORAL | Status: AC
  Administered 2021-04-06: 3 g via ORAL
  Filled 2021-04-06: qty 3

## 2021-04-06 MED ORDER — NYSTATIN 100000 UNIT/GM EX CREA
TOPICAL_CREAM | Freq: Two times a day (BID) | CUTANEOUS | Status: DC
  Administered 2021-04-06: 1 via TOPICAL
  Filled 2021-04-06: qty 30

## 2021-04-06 MED ORDER — ISOSORB DINITRATE-HYDRALAZINE 20-37.5 MG PO TABS
1.0000 | ORAL_TABLET | Freq: Three times a day (TID) | ORAL | Status: DC
  Administered 2021-04-06 – 2021-04-07 (×3): 1 via ORAL
  Filled 2021-04-06 (×3): qty 1

## 2021-04-06 MED ORDER — METOLAZONE 2.5 MG PO TABS: 2.5000 mg | ORAL_TABLET | Freq: Once | ORAL | Status: DC

## 2021-04-06 NOTE — Evaluation (Signed)
Physical Therapy Evaluation Patient Details Name: Matthew [name] MRN: [id number] DOB: 1940/05/10 Today's Date: 04/06/2021  History of Present Illness  81 y.o. male admitted 2/20 who presents to the hospital for dizziness and worsening LE edema, found to be in heart failure exacerbation.  PMH:  hypertension, CAD, HFrEF, hyperlipidemia  Clinical Impression  Pt admitted with above diagnosis. Pt was able to stand to RW at EOB but refused to do more than this due to tightness in LEs.  Pt needed max encouragement to just do this. Pt states his daughter can assist at home. Should progress and be able to go home.  Pt currently with functional limitations due to balance and endurance deficits. Pt will benefit from skilled PT to increase their independence and safety with mobility to allow discharge to the venue listed below.          Recommendations for follow up therapy are one component of a multi-disciplinary discharge planning process, led by the attending physician.  Recommendations may be updated based on patient status, additional functional criteria and insurance authorization.  Follow Up Recommendations Home health PT    Assistance Recommended at Discharge Intermittent Supervision/Assistance  Patient can return home with the following  A lot of help with walking and/or transfers;A little help with bathing/dressing/bathroom;Help with stairs or ramp for entrance    Equipment Recommendations None recommended by PT  Recommendations for Other Services       Functional Status Assessment Patient has had a recent decline in their functional status and demonstrates the ability to make significant improvements in function in a reasonable and predictable amount of time.     Precautions / Restrictions Precautions Precautions: Fall Restrictions Weight Bearing Restrictions: No      Mobility  Bed Mobility Overal bed mobility: Needs Assistance Bed Mobility: Supine to Sit     Supine to sit:  Min assist     General bed mobility comments: Needed a little asssist to EOB.    Transfers Overall transfer level: Needs assistance Equipment used: Rolling Misener (2 wheels) Transfers: Sit to/from Stand Sit to Stand: Mod assist, From elevated surface           General transfer comment: vc's for safety and sequencing. Pt could not stand all the way up and maintained a flexed posture at trunk and bil knees with left knee more flexed than right. Pt states he cannot do more than stand today.    Ambulation/Gait                  Stairs            Wheelchair Mobility    Modified Rankin (Stroke Patients Only)       Balance Overall balance assessment: Needs assistance Sitting-balance support: No upper extremity supported, Feet supported Sitting balance-[name] Scale: Fair     Standing balance support: Bilateral upper extremity supported, During functional activity Standing balance-[name] Scale: Poor Standing balance comment: relies on device and cannot stand fully upright. NEeds min assist for safety.                             Pertinent Vitals/Pain Pain Assessment Pain Assessment: No/denies pain    Home Living Family/patient expects to be discharged to:: Private residence Living Arrangements: Children;Other (Comment) (Daughter and son in law) Available Help at Discharge: Family;Available 24 hours/day (daughter) Type of Home: House Home Access: Ramped entrance Entrance Stairs-Rails: Can reach both     Home  Layout: One level Home Equipment: Rollator (4 wheels);BSC/3in1;Shower seat;Wheelchair - manual      Prior Function Prior Level of Function : Independent/Modified Independent               ADLs Comments: Pt uses 4 wheeled RW at home, reports that he is Mod I bathing and dressing however, no family available to confirm. Family cooks/preps meals.     Hand Dominance   Dominant Hand: Right    Extremity/Trunk Assessment   Upper  Extremity Assessment Upper Extremity Assessment: Defer to OT evaluation    Lower Extremity Assessment Lower Extremity Assessment: Generalized weakness    Cervical / Trunk Assessment Cervical / Trunk Assessment: Kyphotic  Communication   Communication: HOH  Cognition Arousal/Alertness: Awake/alert Behavior During Therapy: WFL for tasks assessed/performed (HOH) Overall Cognitive Status: Within Functional Limits for tasks assessed                                          General Comments General comments (skin integrity, edema, etc.): Increased edema in bilateral LE's,    Exercises General Exercises - Lower Extremity Ankle Circles/Pumps: AROM, Both, 10 reps, Supine Long Arc Quad: AROM, Both, 10 reps, Seated Hip Flexion/Marching: AROM, Both, 10 reps, Seated   Assessment/Plan    PT Assessment Patient needs continued PT services  PT Problem List Decreased activity tolerance;Decreased balance;Decreased mobility;Decreased knowledge of use of DME;Decreased safety awareness;Decreased knowledge of precautions;Cardiopulmonary status limiting activity       PT Treatment Interventions DME instruction;Gait training;Functional mobility training;Therapeutic activities;Therapeutic exercise;Balance training;Patient/family education    PT Goals (Current goals can be found in the Care Plan section)  Acute Rehab PT Goals Patient Stated Goal: to go home PT Goal Formulation: With patient Time For Goal Achievement: 04/20/21 Potential to Achieve Goals: Good    Frequency Min 3X/week     Co-evaluation               AM-PAC PT "6 Clicks" Mobility  Outcome Measure Help needed turning from your back to your side while in a flat bed without using bedrails?: A Little Help needed moving from lying on your back to sitting on the side of a flat bed without using bedrails?: A Little Help needed moving to and from a bed to a chair (including a wheelchair)?: A Little Help needed  standing up from a chair using your arms (e.g., wheelchair or bedside chair)?: A Lot Help needed to walk in hospital room?: Total Help needed climbing 3-5 steps with a railing? : Total 6 Click Score: 13    End of Session Equipment Utilized During Treatment: Gait belt Activity Tolerance: Patient limited by fatigue Patient left: in bed;with call bell/phone within reach;with bed alarm set Nurse Communication: Mobility status PT Visit Diagnosis: Unsteadiness on feet (R26.81);Muscle weakness (generalized) (M62.81)    Time: [id number] PT Time Calculation (min) (ACUTE ONLY): 20 min   Charges:   PT Evaluation $PT Eval Moderate Complexity: 1 Mod          Radin Raptis M,PT Acute Rehab Services [phone] (pager)   Alvira Philips 04/06/2021, 2:02 PM

## 2021-04-06 NOTE — Consult Note (Signed)
Reason for Consult: Symptomatic onychomycosis Referring Physician: Dr. Talbert Cage, MD  Matthew [name] is an 81 y.o. male.  HPI: 81 year old male presenting with HFrEF.  His lower extremity edema and unable to apply compression socks given significant onychomycosis.  Podiatry consult for nail trim.  Past Medical History:  Diagnosis Date   CAD (coronary artery disease)    Cardiomyopathy ([place])    Heart failure ([place])    HTN (hypertension)    Obesity    Osteoarthritis     Past Surgical History:  Procedure Laterality Date   CHONDROPLASTY Left 01/11/2017   Procedure: CHONDROPLASTY of medial and patella compartment;  Surgeon: Dorna Leitz, MD;  Location: [place];  Service: Orthopedics;  Laterality: Left;   KNEE ARTHROSCOPY Left 01/11/2017   Procedure: ARTHROSCOPIC IRRIGATION AND DEBRIDMENT KNEE;  Surgeon: Dorna Leitz, MD;  Location: [place];  Service: Orthopedics;  Laterality: Left;   KNEE SURGERY     MENISECTOMY Left 01/11/2017   Procedure: Lateral MENISECTOMY;  Surgeon: Dorna Leitz, MD;  Location: [facility];  Service: Orthopedics;  Laterality: Left;   SYNOVECTOMY Left 01/11/2017   Procedure: Four compartment SYNOVECTOMY;  Surgeon: Dorna Leitz, MD;  Location: [facility];  Service: Orthopedics;  Laterality: Left;    Family History  Problem Relation Age of Onset   Hypertension Father    Diabetes Brother    Heart attack Neg Hx    Stroke Neg Hx     Social History:  reports that he has never smoked. He has never used smokeless tobacco. He reports that he does not drink alcohol and does not use drugs.  Allergies:  Allergies  Allergen Reactions   Lactose Intolerance (Gi)     Medications: I have reviewed the patient's current medications.  Results for orders placed or performed during the hospital encounter of 04/04/21 (from the past 48 hour(s))  Resp Panel by RT-PCR (Flu A&B, Covid) Nasopharyngeal Swab     Status: None   Collection Time: 04/04/21 12:26 PM   Specimen:  Nasopharyngeal Swab; Nasopharyngeal(NP) swabs in vial transport medium  Result Value Ref Range   SARS Coronavirus 2 by RT PCR NEGATIVE NEGATIVE    Comment: (NOTE) SARS-CoV-2 target nucleic acids are NOT DETECTED.  The SARS-CoV-2 RNA is generally detectable in upper respiratory specimens during the acute phase of infection. The lowest concentration of SARS-CoV-2 viral copies this assay can detect is 138 copies/mL. A negative result does not preclude SARS-Cov-2 infection and should not be used as the sole basis for treatment or other patient management decisions. A negative result may occur with  improper specimen collection/handling, submission of specimen other than nasopharyngeal swab, presence of viral mutation(s) within the areas targeted by this assay, and inadequate number of viral copies(<138 copies/mL). A negative result must be combined with clinical observations, patient history, and epidemiological information. The expected result is Negative.  Fact Sheet for Patients:  EntrepreneurPulse.com.au  Fact Sheet for Healthcare Providers:  IncredibleEmployment.be  This test is no t yet approved or cleared by the [place] FDA and  has been authorized for detection and/or diagnosis of SARS-CoV-2 by FDA under an Emergency Use Authorization (EUA). This EUA will remain  in effect (meaning this test can be used) for the duration of the COVID-19 declaration under Section 564(b)(1) of the Act, 21 U.S.C.section 360bbb-3(b)(1), unless the authorization is terminated  or revoked sooner.       Influenza A by PCR NEGATIVE NEGATIVE   Influenza B by PCR NEGATIVE NEGATIVE    Comment: (NOTE)  The Xpert Xpress SARS-CoV-2/FLU/RSV plus assay is intended as an aid in the diagnosis of influenza from Nasopharyngeal swab specimens and should not be used as a sole basis for treatment. Nasal washings and aspirates are unacceptable for Xpert Xpress  SARS-CoV-2/FLU/RSV testing.  Fact Sheet for Patients: EntrepreneurPulse.com.au  Fact Sheet for Healthcare Providers: IncredibleEmployment.be  This test is not yet approved or cleared by the [place] FDA and has been authorized for detection and/or diagnosis of SARS-CoV-2 by FDA under an Emergency Use Authorization (EUA). This EUA will remain in effect (meaning this test can be used) for the duration of the COVID-19 declaration under Section 564(b)(1) of the Act, 21 U.S.C. section 360bbb-3(b)(1), unless the authorization is terminated or revoked.  Performed at [facility], [place] [address], [place], [place] [address]   CBC with Differential/Platelet     Status: Abnormal   Collection Time: 04/04/21 12:51 PM  Result Value Ref Range   WBC 5.7 4.0 - 10.5 K/uL   RBC 4.69 4.22 - 5.81 MIL/uL   Hemoglobin 11.7 (L) 13.0 - 17.0 g/dL   HCT 36.9 (L) 39.0 - 52.0 %   MCV 78.7 (L) 80.0 - 100.0 fL   MCH 24.9 (L) 26.0 - 34.0 pg   MCHC 31.7 30.0 - 36.0 g/dL   RDW 19.8 (H) 11.5 - 15.5 %   Platelets 116 (L) 150 - 400 K/uL   nRBC 0.0 0.0 - 0.2 %   Neutrophils Relative % 69 %   Neutro Abs 4.0 1.7 - 7.7 K/uL   Lymphocytes Relative 19 %   Lymphs Abs 1.1 0.7 - 4.0 K/uL   Monocytes Relative 11 %   Monocytes Absolute 0.6 0.1 - 1.0 K/uL   Eosinophils Relative 1 %   Eosinophils Absolute 0.1 0.0 - 0.5 K/uL   Basophils Relative 0 %   Basophils Absolute 0.0 0.0 - 0.1 K/uL   Immature Granulocytes 0 %   Abs Immature Granulocytes 0.02 0.00 - 0.07 K/uL    Comment: Performed at [facility], [place] [address] [place], [place] [address]  Comprehensive metabolic panel     Status: Abnormal   Collection Time: 04/04/21 12:51 PM  Result Value Ref Range   Sodium 136 135 - 145 mmol/L   Potassium 3.8 3.5 - 5.1 mmol/L   Chloride 107 98 - 111 mmol/L   CO2 19 (L) 22 - 32 mmol/L   Glucose, Bld 88 70 - 99 mg/dL    Comment: Glucose reference range applies only to  samples taken after fasting for at least 8 hours.   BUN 23 8 - 23 mg/dL   Creatinine, Ser 1.60 (H) 0.61 - 1.24 mg/dL   Calcium 8.7 (L) 8.9 - 10.3 mg/dL   Total Protein 7.0 6.5 - 8.1 g/dL   Albumin 3.0 (L) 3.5 - 5.0 g/dL   AST 20 15 - 41 U/L   ALT 15 0 - 44 U/L   Alkaline Phosphatase 95 38 - 126 U/L   Total Bilirubin 0.9 0.3 - 1.2 mg/dL   GFR, Estimated 43 (L) >60 mL/min    Comment: (NOTE) Calculated using the CKD-EPI Creatinine Equation (2021)    Anion gap 10 5 - 15    Comment: Performed at [facility] [address] [place], [place] [address]  Brain natriuretic peptide     Status: Abnormal   Collection Time: 04/04/21 12:51 PM  Result Value Ref Range   B Natriuretic Peptide >4,500.0 (H) 0.0 - 100.0 pg/mL  Comment: Performed at [facility], [place] [address] [place], [place] [address]  Troponin I (High Sensitivity)     Status: Abnormal   Collection Time: 04/04/21 12:51 PM  Result Value Ref Range   Troponin I (High Sensitivity) 25 (H) <18 ng/L    Comment: (NOTE) Elevated high sensitivity troponin I (hsTnI) values and significant  changes across serial measurements may suggest ACS but many other  chronic and acute conditions are known to elevate hsTnI results.  Refer to the "Links" section for chest pain algorithms and additional  guidance. Performed at [facility], [place] [address], [place], [place] [address]   Troponin I (High Sensitivity)     Status: Abnormal   Collection Time: 04/04/21  2:40 PM  Result Value Ref Range   Troponin I (High Sensitivity) 26 (H) <18 ng/L    Comment: (NOTE) Elevated high sensitivity troponin I (hsTnI) values and significant  changes across serial measurements may suggest ACS but many other  chronic and acute conditions are known to elevate hsTnI results.  Refer to the "Links" section for chest pain algorithms and additional  guidance. Performed at [facility], [place] [address] [place], [place] [address]   Magnesium      Status: None   Collection Time: 04/04/21  5:36 PM  Result Value Ref Range   Magnesium 2.0 1.7 - 2.4 mg/dL    Comment: Performed at [facility] [address] [place], [place] [id number]  Basic metabolic panel     Status: Abnormal   Collection Time: 04/05/21  2:09 AM  Result Value Ref Range   Sodium 138 135 - 145 mmol/L   Potassium 4.0 3.5 - 5.1 mmol/L   Chloride 106 98 - 111 mmol/L   CO2 21 (L) 22 - 32 mmol/L   Glucose, Bld 106 (H) 70 - 99 mg/dL    Comment: Glucose reference range applies only to samples taken after fasting for at least 8 hours.   BUN 25 (H) 8 - 23 mg/dL   Creatinine, Ser 1.70 (H) 0.61 - 1.24 mg/dL   Calcium 8.8 (L) 8.9 - 10.3 mg/dL   GFR, Estimated 40 (L) >60 mL/min    Comment: (NOTE) Calculated using the CKD-EPI Creatinine Equation (2021)    Anion gap 11 5 - 15    Comment: Performed at [facility] [address], [place], [place] [address]  Iron and TIBC     Status: Abnormal   Collection Time: 04/05/21  2:09 AM  Result Value Ref Range   Iron 22 (L) 45 - 182 ug/dL   TIBC 308 250 - 450 ug/dL   Saturation Ratios 7 (L) 17.9 - 39.5 %   UIBC 286 ug/dL    Comment: Performed at [facility], [place] [address] [place], [place] [address]  Ferritin     Status: None   Collection Time: 04/05/21  2:09 AM  Result Value Ref Range   Ferritin 110 24 - 336 ng/mL    Comment: Performed at [facility], [place] [address] [place] [address]  CBC     Status: Abnormal   Collection Time: 04/05/21  2:09 AM  Result Value Ref Range   WBC 6.4 4.0 - 10.5 K/uL   RBC 4.60 4.22 - 5.81 MIL/uL   Hemoglobin 11.5 (L) 13.0 - 17.0 g/dL   HCT 36.7 (L) 39.0 - 52.0 %   MCV 79.8 (L) 80.0 - 100.0 fL   MCH  25.0 (L) 26.0 - 34.0 pg   MCHC 31.3 30.0 - 36.0 g/dL   RDW 20.0 (H) 11.5 - 15.5 %   Platelets 122 (L) 150 - 400 K/uL    Comment: Immature Platelet Fraction may be clinically indicated, consider ordering this additional test [id number] REPEATED TO VERIFY     nRBC 0.0 0.0 - 0.2 %    Comment: Performed at [facility], [place] [address] [place], [place] [address]  Urinalysis, Routine w reflex microscopic Urine, Clean Catch     Status: Abnormal   Collection Time: 04/05/21  6:28 PM  Result Value Ref Range   Color, Urine YELLOW YELLOW   APPearance HAZY (A) CLEAR   Specific Gravity, Urine 1.011 1.005 - 1.030   pH 5.0 5.0 - 8.0   Glucose, UA NEGATIVE NEGATIVE mg/dL   Hgb urine dipstick MODERATE (A) NEGATIVE   Bilirubin Urine NEGATIVE NEGATIVE   Ketones, ur NEGATIVE NEGATIVE mg/dL   Protein, ur 30 (A) NEGATIVE mg/dL   Nitrite NEGATIVE NEGATIVE   Leukocytes,Ua LARGE (A) NEGATIVE   RBC / HPF 0-5 0 - 5 RBC/hpf   WBC, UA >50 (H) 0 - 5 WBC/hpf   Bacteria, UA RARE (A) NONE SEEN   Squamous Epithelial / LPF 0-5 0 - 5   Mucus PRESENT    Hyaline Casts, UA PRESENT     Comment: Performed at [facility], [place] [address], [place], [place] [id number]  Basic metabolic panel     Status: Abnormal   Collection Time: 04/05/21  6:58 PM  Result Value Ref Range   Sodium 137 135 - 145 mmol/L   Potassium 4.5 3.5 - 5.1 mmol/L   Chloride 108 98 - 111 mmol/L   CO2 18 (L) 22 - 32 mmol/L   Glucose, Bld 111 (H) 70 - 99 mg/dL    Comment: Glucose reference range applies only to samples taken after fasting for at least 8 hours.   BUN 27 (H) 8 - 23 mg/dL   Creatinine, Ser 1.73 (H) 0.61 - 1.24 mg/dL   Calcium 8.6 (L) 8.9 - 10.3 mg/dL   GFR, Estimated 39 (L) >60 mL/min    Comment: (NOTE) Calculated using the CKD-EPI Creatinine Equation (2021)    Anion gap 11 5 - 15    Comment: Performed at [facility] [address] [place], [place] [id number]  Basic metabolic panel     Status: Abnormal   Collection Time: 04/06/21  4:04 AM  Result Value Ref Range   Sodium 137 135 - 145 mmol/L   Potassium 4.2 3.5 - 5.1 mmol/L   Chloride 104 98 - 111 mmol/L   CO2 20 (L) 22 - 32 mmol/L   Glucose, Bld 111 (H) 70 - 99 mg/dL    Comment: Glucose reference range applies  only to samples taken after fasting for at least 8 hours.   BUN 26 (H) 8 - 23 mg/dL   Creatinine, Ser 1.71 (H) 0.61 - 1.24 mg/dL   Calcium 8.9 8.9 - 10.3 mg/dL   GFR, Estimated 40 (L) >60 mL/min    Comment: (NOTE) Calculated using the CKD-EPI Creatinine Equation (2021)    Anion gap 13 5 - 15    Comment: Performed at [facility] [address], [place], [place] [address]  Magnesium     Status: None   Collection Time: 04/06/21  4:04 AM  Result Value Ref Range   Magnesium 1.9 1.7 - 2.4 mg/dL    Comment:  Performed at [facility], [place] [address], [place], [place] [address]  CBC     Status: Abnormal   Collection Time: 04/06/21  4:04 AM  Result Value Ref Range   WBC 6.1 4.0 - 10.5 K/uL   RBC 4.77 4.22 - 5.81 MIL/uL   Hemoglobin 11.7 (L) 13.0 - 17.0 g/dL   HCT 37.1 (L) 39.0 - 52.0 %   MCV 77.8 (L) 80.0 - 100.0 fL   MCH 24.5 (L) 26.0 - 34.0 pg   MCHC 31.5 30.0 - 36.0 g/dL   RDW 19.6 (H) 11.5 - 15.5 %   Platelets 129 (L) 150 - 400 K/uL   nRBC 0.0 0.0 - 0.2 %    Comment: Performed at [facility] [address] [place], [place] [address]    CT Head Wo Contrast  Result Date: 04/04/2021 CLINICAL DATA:  Dizziness. EXAM: CT HEAD WITHOUT CONTRAST TECHNIQUE: Contiguous axial images were obtained from the base of the skull through the vertex without intravenous contrast. RADIATION DOSE REDUCTION: This exam was performed according to the departmental dose-optimization program which includes automated exposure control, adjustment of the mA and/or kV according to patient size and/or use of iterative reconstruction technique. COMPARISON:  None. FINDINGS: Brain: There is no evidence of an acute infarct, intracranial hemorrhage, mass, midline shift, or extra-axial fluid collection. Mild cerebral atrophy is within normal limits for age. Hypodensities in the cerebral white matter are nonspecific but compatible with minimal chronic small vessel ischemic disease. There are 2 punctate  calcifications in the pons. Vascular: Calcified atherosclerosis at the skull base. No hyperdense vessel. Skull: No acute fracture or suspicious osseous lesion. Sinuses/Orbits: Complete opacification of the left frontal sinus with evidence of chronic sinusitis. Partial left anterior ethmoid air cell opacification. Mucosal thickening and fluid in the left maxillary sinus. Clear mastoid air cells. Unremarkable orbits. Other: None. IMPRESSION: 1. No evidence of acute intracranial abnormality. 2. Chronic left frontal sinusitis. Left maxillary sinus fluid, correlate for acute sinusitis. Electronically Signed   By: Logan Bores M.D.   On: 04/04/2021 12:15   ECHOCARDIOGRAM COMPLETE  Result Date: 04/04/2021    ECHOCARDIOGRAM REPORT   Patient Name:   Matthew [name] Date of Exam: 04/04/2021 Medical Rec #:  [id number]        Height:       78.0 in Accession #:    [id number]       Weight:       231.0 lb Date of Birth:  December 26, 1940        BSA:          2.400 m Patient Age:    32 years         BP:           132/111 mmHg Patient Gender: M                HR:           83 bpm. Exam Location:  Inpatient Procedure: 2D Echo Indications:    congestive heart failure  History:        Patient has prior history of Echocardiogram examinations, most                 recent 05/10/2020. Cardiomyopathy and CHF, Abnormal ECG; Risk                 Factors:Hypertension and Dyslipidemia.  Sonographer:    Johny Chess RDCS Referring Phys: [employer]  1. Left ventricular  ejection fraction, by estimation, is 20 to 25%. The left ventricle has severely decreased function. The left ventricle demonstrates global hypokinesis. The left ventricular internal cavity size was moderately to severely dilated. There is moderate left ventricular hypertrophy. Left ventricular diastolic parameters are indeterminate.  2. Right ventricular systolic function is severely reduced. The right ventricular size is severely enlarged. There is  moderately elevated pulmonary artery systolic pressure.  3. Left atrial size was severely dilated.  4. Right atrial size was severely dilated.  5. The mitral valve is grossly normal. Severe mitral valve regurgitation.  6. Tricuspid valve regurgitation is severe.  7. The aortic valve is grossly normal. Aortic valve regurgitation is trivial. No aortic stenosis is present. FINDINGS  Left Ventricle: Left ventricular ejection fraction, by estimation, is 20 to 25%. The left ventricle has severely decreased function. The left ventricle demonstrates global hypokinesis. The left ventricular internal cavity size was moderately to severely  dilated. There is moderate left ventricular hypertrophy. Left ventricular diastolic parameters are indeterminate. Right Ventricle: The right ventricular size is severely enlarged. Right vetricular wall thickness was not well visualized. Right ventricular systolic function is severely reduced. There is moderately elevated pulmonary artery systolic pressure. The tricuspid regurgitant velocity is 2.96 m/s, and with an assumed right atrial pressure of 15 mmHg, the estimated right ventricular systolic pressure is [id number] mmHg. Left Atrium: Left atrial size was severely dilated. Right Atrium: Right atrial size was severely dilated. Pericardium: There is no evidence of pericardial effusion. Mitral Valve: The mitral valve is grossly normal. Severe mitral valve regurgitation. Tricuspid Valve: The tricuspid valve is grossly normal. Tricuspid valve regurgitation is severe. Aortic Valve: The aortic valve is grossly normal. Aortic valve regurgitation is trivial. No aortic stenosis is present. Pulmonic Valve: The pulmonic valve was grossly normal. Pulmonic valve regurgitation is mild. Aorta: The aortic root and ascending aorta are structurally normal, with no evidence of dilitation. IAS/Shunts: The atrial septum is grossly normal.  LEFT VENTRICLE PLAX 2D LVIDd:         7.10 cm LVIDs:         6.10 cm LV PW:          1.30 cm LV IVS:        1.00 cm LVOT diam:     2.10 cm LVOT Area:     3.46 cm  IVC IVC diam: 3.20 cm LEFT ATRIUM              Index        RIGHT ATRIUM           Index LA diam:        5.50 cm  2.29 cm/m   RA Area:     32.10 cm LA Vol (A2C):   150.0 ml 62.50 ml/m  RA Volume:   121.00 ml 50.41 ml/m LA Vol (A4C):   143.0 ml 59.58 ml/m LA Biplane Vol: 154.0 ml 64.16 ml/m   AORTA Ao Root diam: 3.90 cm Ao Asc diam:  3.80 cm TRICUSPID VALVE TR Peak grad:   35.0 mmHg TR Vmax:        296.00 cm/s  SHUNTS Systemic Diam: 2.10 cm Mertie Moores MD Electronically signed by Mertie Moores MD Signature Date/Time: 04/04/2021/5:16:15 PM    Final     Review of Systems Blood pressure (!) 133/96, pulse 72, temperature 97.6 F (36.4 C), temperature source Oral, resp. rate (!) 23, height 6\' 7"  (2.007 m), weight 114 kg, SpO2 97 %. Physical Exam General: AAO x3, NAD- EXAM  LIMITED TO THE TOES   Dermatological: Nails appear to be hypertrophic, dystrophic with yellow, brown discoloration, tenderness.  The nails are also elongated curving on themselves causing pressure onto the adjacent toes.  There is no drainage or pus.  Dressings intact to the legs.       Vascular: Dorsalis Pedis artery and Posterior Tibial artery pedal pulses are palpable bilateral with immedate capillary fill time.   Neruologic: Grossly intact via light touch bilateral.   Musculoskeletal: Bunion, hammertoes present   Assessment/Plan: Symptomatic onychomycosis  Sharply debrided the nails x10 without any complications or bleeding.  Has been treated by the wound care team.  Apparently if any further assistance please let us know.  He can follow-up with [place] on outpatient basis for routine foot care.  Podiatry to sign off  Trula Slade 04/06/2021, 12:08 PM

## 2021-04-06 NOTE — Progress Notes (Signed)
Occupational Therapy Treatment Patient Details Name: Matthew [name] MRN: [id number] DOB: November 15, 1940 Today's Date: 04/06/2021   History of present illness 81 y.o. male admitted 2/20 who presents to the hospital for dizziness and worsening LE edema, found to be in heart failure exacerbation.  PMH:  hypertension, CAD, HFrEF, hyperlipidemia   OT comments  Patient extremely self limiting in session to date. Refusing all mobility, practicing lower body ADLs, or sitting EOB. With max encouragement, patient completing bed level exercises. Of note, patient refusing mobility other than sitting EOB with PT this morning. Recommending patient work with mobility tech in order to increase activity tolerance. Recommendation remaining for home health, however if minimal participation continues patient may benefit from SNF.    Recommendations for follow up therapy are one component of a multi-disciplinary discharge planning process, led by the attending physician.  Recommendations may be updated based on patient status, additional functional criteria and insurance authorization.    Follow Up Recommendations  Home health OT    Assistance Recommended at Discharge Intermittent Supervision/Assistance  Patient can return home with the following  A little help with walking and/or transfers;A little help with bathing/dressing/bathroom;Assistance with cooking/housework   Equipment Recommendations  Other (comment) (Continue to assess in a functional setting)    Recommendations for Other Services      Precautions / Restrictions Precautions Precautions: Fall Restrictions Weight Bearing Restrictions: No       Mobility Bed Mobility               General bed mobility comments: Refused    Transfers                   General transfer comment: Refused     Balance                                           ADL either performed or assessed with clinical judgement   ADL  Overall ADL's : Needs assistance/impaired                                       General ADL Comments: Refused attempts at ADLs in session despite max encouragement    Extremity/Trunk Assessment Upper Extremity Assessment Upper Extremity Assessment: Defer to OT evaluation   Lower Extremity Assessment Lower Extremity Assessment: Generalized weakness   Cervical / Trunk Assessment Cervical / Trunk Assessment: Kyphotic    Vision       Perception     Praxis      Cognition Arousal/Alertness: Awake/alert Behavior During Therapy: WFL for tasks assessed/performed (HOH) Overall Cognitive Status: Impaired/Different from baseline Area of Impairment: Memory                     Memory: Decreased short-term memory         General Comments: Very self limiting in session, did not recall working with PT earlier in the morning        Exercises General Exercises - Lower Extremity Ankle Circles/Pumps: AROM, Both, 10 reps, Supine Quad Sets: AROM, Both, 10 reps, Supine Straight Leg Raises: AROM, Both, 10 reps, Supine    Shoulder Instructions       General Comments Increased edema in bilateral LE's,    Pertinent Vitals/ Pain       Pain Assessment  Pain Assessment: No/denies pain  Home Living Family/patient expects to be discharged to:: Private residence Living Arrangements: Children;Other (Comment) (Daughter and son in law) Available Help at Discharge: Family;Available 24 hours/day (daughter) Type of Home: House Home Access: Ramped entrance   Entrance Stairs-Rails: Can reach both Home Layout: One level     Bathroom Shower/Tub: Producer, television/film/video: Standard Bathroom Accessibility: Yes   Home Equipment: Rollator (4 wheels);BSC/3in1;Shower seat;Wheelchair - manual          Prior Functioning/Environment              Frequency  Min 2X/week        Progress Toward Goals  OT Goals(current goals can now be found in the care  plan section)  Progress towards OT goals: Progressing toward goals  Acute Rehab OT Goals Patient Stated Goal: Come back tomorrow OT Goal Formulation: Patient unable to participate in goal setting Time For Goal Achievement: 05/03/21 Potential to Achieve Goals: Fair  Plan Discharge plan remains appropriate    Co-evaluation                 AM-PAC OT "6 Clicks" Daily Activity     Outcome Measure   Help from another person eating meals?: None Help from another person taking care of personal grooming?: A Little Help from another person toileting, which includes using toliet, bedpan, or urinal?: A Little Help from another person bathing (including washing, rinsing, drying)?: A Little Help from another person to put on and taking off regular upper body clothing?: None Help from another person to put on and taking off regular lower body clothing?: A Lot 6 Click Score: 19    End of Session    OT Visit Diagnosis: Muscle weakness (generalized) (M62.81);Unsteadiness on feet (R26.81)   Activity Tolerance Patient limited by lethargy   Patient Left in bed;with call bell/phone within reach;with family/visitor present   Nurse Communication Mobility status        Time: 1341-1356 OT Time Calculation (min): 15 min  Charges: OT General Charges $OT Visit: 1 Visit OT Treatments $Therapeutic Exercise: 8-22 mins  Pollyann Glen E. Cassi Jenne, OTR/L Acute Rehabilitation Services [phone]   Cherlyn Cushing 04/06/2021, 3:04 PM

## 2021-04-06 NOTE — Progress Notes (Addendum)
Progress Note  Patient Name: Matthew [name] Date of Encounter: 04/06/2021  Primary Cardiologist: Larae Grooms, MD   Subjective   Patient is seen at bedside. He is comfortable but mildly tachypneic. Denies chest pain or SOB.   Inpatient Medications    Scheduled Meds:  aspirin EC  81 mg Oral Daily   atorvastatin  10 mg Oral Daily   carvedilol  25 mg Oral BID WC   enoxaparin (LOVENOX) injection  40 mg Subcutaneous Q24H   sodium chloride flush  3 mL Intravenous Q12H   spironolactone  25 mg Oral Daily   Continuous Infusions:  sodium chloride     furosemide (LASIX) 200 mg in dextrose 5% 100 mL (2mg /mL) infusion 5 mg/hr (04/05/21 1100)   PRN Meds: sodium chloride, acetaminophen, sodium chloride flush   Vital Signs    Vitals:   04/06/21 0027 04/06/21 0412 04/06/21 0426 04/06/21 0800  BP: 131/87  (!) 133/95   Pulse: 75  74   Resp: 20  20   Temp: 97.8 F (36.6 C)  (!) 97.4 F (36.3 C) 97.7 F (36.5 C)  TempSrc: Oral  Oral Oral  SpO2: 100%  100%   Weight:  114 kg    Height:        Intake/Output Summary (Last 24 hours) at 04/06/2021 0807 Last data filed at 04/06/2021 0500 Gross per 24 hour  Intake 776 ml  Output 1300 ml  Net -524 ml   Filed Weights   04/04/21 1744 04/05/21 0100 04/06/21 0412  Weight: 114.7 kg 115.2 kg 114 kg    Telemetry    NSR, high burden of PVC - Personally Reviewed  ECG    2/20: Sinus rhythm, PVC, ventricular bigeminy, bifascicular block- Personally Reviewed  Physical Exam   GEN: No acute distress.   Neck: positive JVD Cardiac: RRR, PVC noted. no murmurs, rubs, or gallops.  Respiratory: Diminished breath sound right lung base.  GI: Soft, nontender, non-distended  MS: legs wrapped. Extremities warm to touch Neuro:  Nonfocal  Psych: Normal affect   Labs    Chemistry Recent Labs  Lab 04/04/21 1251 04/05/21 0209 04/05/21 1858 04/06/21 0404  NA 136 138 137 137  K 3.8 4.0 4.5 4.2  CL 107 106 108 104  CO2 19* 21* 18*  20*  GLUCOSE 88 106* 111* 111*  BUN 23 25* 27* 26*  CREATININE 1.60* 1.70* 1.73* 1.71*  CALCIUM 8.7* 8.8* 8.6* 8.9  PROT 7.0  --   --   --   ALBUMIN 3.0*  --   --   --   AST 20  --   --   --   ALT 15  --   --   --   ALKPHOS 95  --   --   --   BILITOT 0.9  --   --   --   GFRNONAA 43* 40* 39* 40*  ANIONGAP 10 11 11 13      Hematology Recent Labs  Lab 04/04/21 1251 04/05/21 0209 04/06/21 0404  WBC 5.7 6.4 6.1  RBC 4.69 4.60 4.77  HGB 11.7* 11.5* 11.7*  HCT 36.9* 36.7* 37.1*  MCV 78.7* 79.8* 77.8*  MCH 24.9* 25.0* 24.5*  MCHC 31.7 31.3 31.5  RDW 19.8* 20.0* 19.6*  PLT 116* 122* 129*    Cardiac EnzymesNo results for input(s): TROPONINI in the last 168 hours. No results for input(s): TROPIPOC in the last 168 hours.   BNP Recent Labs  Lab 04/04/21 1251  BNP >4,500.0*  DDimer No results for input(s): DDIMER in the last 168 hours.   Radiology    CT Head Wo Contrast  Result Date: 04/04/2021 CLINICAL DATA:  Dizziness. EXAM: CT HEAD WITHOUT CONTRAST TECHNIQUE: Contiguous axial images were obtained from the base of the skull through the vertex without intravenous contrast. RADIATION DOSE REDUCTION: This exam was performed according to the departmental dose-optimization program which includes automated exposure control, adjustment of the mA and/or kV according to patient size and/or use of iterative reconstruction technique. COMPARISON:  None. FINDINGS: Brain: There is no evidence of an acute infarct, intracranial hemorrhage, mass, midline shift, or extra-axial fluid collection. Mild cerebral atrophy is within normal limits for age. Hypodensities in the cerebral white matter are nonspecific but compatible with minimal chronic small vessel ischemic disease. There are 2 punctate calcifications in the pons. Vascular: Calcified atherosclerosis at the skull base. No hyperdense vessel. Skull: No acute fracture or suspicious osseous lesion. Sinuses/Orbits: Complete opacification of the  left frontal sinus with evidence of chronic sinusitis. Partial left anterior ethmoid air cell opacification. Mucosal thickening and fluid in the left maxillary sinus. Clear mastoid air cells. Unremarkable orbits. Other: None. IMPRESSION: 1. No evidence of acute intracranial abnormality. 2. Chronic left frontal sinusitis. Left maxillary sinus fluid, correlate for acute sinusitis. Electronically Signed   By: Logan Bores M.D.   On: 04/04/2021 12:15   DG Chest Port 1 View  Result Date: 04/04/2021 CLINICAL DATA:  Shortness of breath EXAM: PORTABLE CHEST 1 VIEW COMPARISON:  Radiograph 01/11/2017 FINDINGS: Unchanged, enlarged cardiac silhouette. Central pulmonary vascular congestion. Mild edema. There is a moderate size layering right pleural effusion with adjacent basilar opacities. Skin fold overlies the left upper chest. No visible pneumothorax. Bilateral shoulder osteoarthritis with findings of probable rotator cuff disease. IMPRESSION: Cardiomegaly with mild pulmonary edema. Moderate layering right pleural effusion with adjacent basilar opacities, likely atelectasis. Electronically Signed   By: Maurine Simmering M.D.   On: 04/04/2021 11:18   ECHOCARDIOGRAM COMPLETE  Result Date: 04/04/2021    ECHOCARDIOGRAM REPORT   Patient Name:   Matthew [name] Date of Exam: 04/04/2021 Medical Rec #:  [id number]        Height:       78.0 in Accession #:    [id number]       Weight:       231.0 lb Date of Birth:  09-28-1940        BSA:          2.400 m Patient Age:    81 years         BP:           132/111 mmHg Patient Gender: M                HR:           83 bpm. Exam Location:  Inpatient Procedure: 2D Echo Indications:    congestive heart failure  History:        Patient has prior history of Echocardiogram examinations, most                 recent 05/10/2020. Cardiomyopathy and CHF, Abnormal ECG; Risk                 Factors:Hypertension and Dyslipidemia.  Sonographer:    Johny Chess RDCS Referring Phys: [place]  1. Left ventricular ejection fraction, by estimation, is 20 to 25%. The left ventricle has severely decreased function. The left ventricle demonstrates global hypokinesis.  The left ventricular internal cavity size was moderately to severely dilated. There is moderate left ventricular hypertrophy. Left ventricular diastolic parameters are indeterminate.  2. Right ventricular systolic function is severely reduced. The right ventricular size is severely enlarged. There is moderately elevated pulmonary artery systolic pressure.  3. Left atrial size was severely dilated.  4. Right atrial size was severely dilated.  5. The mitral valve is grossly normal. Severe mitral valve regurgitation.  6. Tricuspid valve regurgitation is severe.  7. The aortic valve is grossly normal. Aortic valve regurgitation is trivial. No aortic stenosis is present. FINDINGS  Left Ventricle: Left ventricular ejection fraction, by estimation, is 20 to 25%. The left ventricle has severely decreased function. The left ventricle demonstrates global hypokinesis. The left ventricular internal cavity size was moderately to severely  dilated. There is moderate left ventricular hypertrophy. Left ventricular diastolic parameters are indeterminate. Right Ventricle: The right ventricular size is severely enlarged. Right vetricular wall thickness was not well visualized. Right ventricular systolic function is severely reduced. There is moderately elevated pulmonary artery systolic pressure. The tricuspid regurgitant velocity is 2.96 m/s, and with an assumed right atrial pressure of 15 mmHg, the estimated right ventricular systolic pressure is [id number] mmHg. Left Atrium: Left atrial size was severely dilated. Right Atrium: Right atrial size was severely dilated. Pericardium: There is no evidence of pericardial effusion. Mitral Valve: The mitral valve is grossly normal. Severe mitral valve regurgitation. Tricuspid Valve: The tricuspid valve is  grossly normal. Tricuspid valve regurgitation is severe. Aortic Valve: The aortic valve is grossly normal. Aortic valve regurgitation is trivial. No aortic stenosis is present. Pulmonic Valve: The pulmonic valve was grossly normal. Pulmonic valve regurgitation is mild. Aorta: The aortic root and ascending aorta are structurally normal, with no evidence of dilitation. IAS/Shunts: The atrial septum is grossly normal.  LEFT VENTRICLE PLAX 2D LVIDd:         7.10 cm LVIDs:         6.10 cm LV PW:         1.30 cm LV IVS:        1.00 cm LVOT diam:     2.10 cm LVOT Area:     3.46 cm  IVC IVC diam: 3.20 cm LEFT ATRIUM              Index        RIGHT ATRIUM           Index LA diam:        5.50 cm  2.29 cm/m   RA Area:     32.10 cm LA Vol (A2C):   150.0 ml 62.50 ml/m  RA Volume:   121.00 ml 50.41 ml/m LA Vol (A4C):   143.0 ml 59.58 ml/m LA Biplane Vol: 154.0 ml 64.16 ml/m   AORTA Ao Root diam: 3.90 cm Ao Asc diam:  3.80 cm TRICUSPID VALVE TR Peak grad:   35.0 mmHg TR Vmax:        296.00 cm/s  SHUNTS Systemic Diam: 2.10 cm Mertie Moores MD Electronically signed by Mertie Moores MD Signature Date/Time: 04/04/2021/5:16:15 PM    Final     Cardiac Studies   TTE 04/05/21 IMPRESSIONS    1. Left ventricular ejection fraction, by estimation, is 20 to 25%. The  left ventricle has severely decreased function. The left ventricle  demonstrates global hypokinesis. The left ventricular internal cavity size  was moderately to severely dilated.  There is moderate left ventricular hypertrophy. Left ventricular diastolic  parameters are  indeterminate.   2. Right ventricular systolic function is severely reduced. The right  ventricular size is severely enlarged. There is moderately elevated  pulmonary artery systolic pressure.   3. Left atrial size was severely dilated.   4. Right atrial size was severely dilated.   5. The mitral valve is grossly normal. Severe mitral valve regurgitation.   6. Tricuspid valve regurgitation  is severe.   7. The aortic valve is grossly normal. Aortic valve regurgitation is  trivial. No aortic stenosis is present.   Patient Profile     81 y.o. male with past medical history of hypertension, CAD, HFrEF, hyperlipidemia who presents to the hospital for dizziness and worsening LE edema, found to be in heart failure exacerbation 2/2 medications nonadherence.  Assessment & Plan    Acute on chronic HFrEF Hypertensive cardiomyopathy CAD In the setting of medication nonadherence.  Repeat echocardiogram showed persistent severe biventricular dysfunction, which is worse than his echo in March 2022. Net negative output overnight.  Weight is trending down.  No signs of cardiogenic shock.  Creatinine stable. -Increase Lasix gtt 7.5 mg/h.  Monitor electrolytes closely. -Will hold off on inotrope -Continue Coreg and Spiro -Continue aspirin   AKI  Presumed cardiorenal in the setting of volume overload.  Kidney function stable  -Continue diuresis -Holding Entresto and [place]   Hyperlipidemia LDL 44 in March -Continue Lipitor 10 mg  For questions or updates, please contact [facility] Please consult [email or web address] for contact info under Cardiology/STEMI.      Signed, Doran Stabler, DO  04/06/2021, 8:07 AM     ATTENDING ATTESTATION:  After conducting a review of all available clinical information with the care team, interviewing the patient, and performing a physical exam, I agree with the findings and plan described in this note.   GEN: No acute distress.   Cardiac: RRR, no murmurs, rubs, or gallops.   Respiratory: Coarse bilaterally GI: Soft, nontender, non-distended  MS: Legs in wraps but swelling improved; no deformity. Neuro:  Nonfocal  Vasc:  +2 radial pulses  Patient is now net negative over 2 days after Lasix drip and metolazone one-time dose administered.  We will increase Lasix to 7.5 mg an hour.  We will spot dose of metolazone if needed.  Creatinine is stable.   The patient's respiratory status and peripheral edema is improved.  I would like to diurese the patient until the patient's BUN starts rising and then change him over to p.o. diuretics.  We could consider a right heart catheterization prior to discharge to ensure that he is euvolemic.  We will obtain another echocardiogram once he is euvolemic to assess his mitral valve.  His previous echocardiogram demonstrated severe mitral regurgitation.  GFR is reasonable so we will start Farxiga 10 mg daily.  We need to keep his blood pressure well controlled so he is well afterload reduced.  We will start BiDil 20/37.5 TID with goal BP <163mmHG as long as no presyncope, etc.  Keep lytes stable.  Will plan on restarting Entresto prior to discharge.  Alverda Skeans, MD Pager [phone]

## 2021-04-06 NOTE — Progress Notes (Signed)
Progress Note  Patient Name: Matthew [name] Date of Encounter: 04/06/2021  Primary Cardiologist: Larae Grooms, MD   Subjective   Patient is seen at bedside. He is comfortable but mildly tachypneic. Denies chest pain or SOB.   Inpatient Medications    Scheduled Meds:  aspirin EC  81 mg Oral Daily   atorvastatin  10 mg Oral Daily   carvedilol  25 mg Oral BID WC   enoxaparin (LOVENOX) injection  40 mg Subcutaneous Q24H   sodium chloride flush  3 mL Intravenous Q12H   spironolactone  25 mg Oral Daily   Continuous Infusions:  sodium chloride     furosemide (LASIX) 200 mg in dextrose 5% 100 mL (2mg /mL) infusion 5 mg/hr (04/05/21 1100)   PRN Meds: sodium chloride, acetaminophen, sodium chloride flush   Vital Signs    Vitals:   04/06/21 0027 04/06/21 0412 04/06/21 0426 04/06/21 0800  BP: 131/87  (!) 133/95   Pulse: 75  74   Resp: 20  20   Temp: 97.8 F (36.6 C)  (!) 97.4 F (36.3 C) 97.7 F (36.5 C)  TempSrc: Oral  Oral Oral  SpO2: 100%  100%   Weight:  114 kg    Height:        Intake/Output Summary (Last 24 hours) at 04/06/2021 0807 Last data filed at 04/06/2021 0500 Gross per 24 hour  Intake 776 ml  Output 1300 ml  Net -524 ml   Filed Weights   04/04/21 1744 04/05/21 0100 04/06/21 0412  Weight: 114.7 kg 115.2 kg 114 kg    Telemetry    NSR, high burden of PVC - Personally Reviewed  ECG    2/20: Sinus rhythm, PVC, ventricular bigeminy, bifascicular block- Personally Reviewed  Physical Exam   GEN: No acute distress.   Neck: positive JVD Cardiac: RRR, PVC noted. no murmurs, rubs, or gallops.  Respiratory: Diminished breath sound right lung base.  GI: Soft, nontender, non-distended  MS: legs wrapped. Extremities warm to touch Neuro:  Nonfocal  Psych: Normal affect   Labs    Chemistry Recent Labs  Lab 04/04/21 1251 04/05/21 0209 04/05/21 1858 04/06/21 0404  NA 136 138 137 137  K 3.8 4.0 4.5 4.2  CL 107 106 108 104  CO2 19* 21* 18*  20*  GLUCOSE 88 106* 111* 111*  BUN 23 25* 27* 26*  CREATININE 1.60* 1.70* 1.73* 1.71*  CALCIUM 8.7* 8.8* 8.6* 8.9  PROT 7.0  --   --   --   ALBUMIN 3.0*  --   --   --   AST 20  --   --   --   ALT 15  --   --   --   ALKPHOS 95  --   --   --   BILITOT 0.9  --   --   --   GFRNONAA 43* 40* 39* 40*  ANIONGAP 10 11 11 13      Hematology Recent Labs  Lab 04/04/21 1251 04/05/21 0209 04/06/21 0404  WBC 5.7 6.4 6.1  RBC 4.69 4.60 4.77  HGB 11.7* 11.5* 11.7*  HCT 36.9* 36.7* 37.1*  MCV 78.7* 79.8* 77.8*  MCH 24.9* 25.0* 24.5*  MCHC 31.7 31.3 31.5  RDW 19.8* 20.0* 19.6*  PLT 116* 122* 129*    Cardiac EnzymesNo results for input(s): TROPONINI in the last 168 hours. No results for input(s): TROPIPOC in the last 168 hours.   BNP Recent Labs  Lab 04/04/21 1251  BNP >4,500.0*  DDimer No results for input(s): DDIMER in the last 168 hours.   Radiology    CT Head Wo Contrast  Result Date: 04/04/2021 CLINICAL DATA:  Dizziness. EXAM: CT HEAD WITHOUT CONTRAST TECHNIQUE: Contiguous axial images were obtained from the base of the skull through the vertex without intravenous contrast. RADIATION DOSE REDUCTION: This exam was performed according to the departmental dose-optimization program which includes automated exposure control, adjustment of the mA and/or kV according to patient size and/or use of iterative reconstruction technique. COMPARISON:  None. FINDINGS: Brain: There is no evidence of an acute infarct, intracranial hemorrhage, mass, midline shift, or extra-axial fluid collection. Mild cerebral atrophy is within normal limits for age. Hypodensities in the cerebral white matter are nonspecific but compatible with minimal chronic small vessel ischemic disease. There are 2 punctate calcifications in the pons. Vascular: Calcified atherosclerosis at the skull base. No hyperdense vessel. Skull: No acute fracture or suspicious osseous lesion. Sinuses/Orbits: Complete opacification of the  left frontal sinus with evidence of chronic sinusitis. Partial left anterior ethmoid air cell opacification. Mucosal thickening and fluid in the left maxillary sinus. Clear mastoid air cells. Unremarkable orbits. Other: None. IMPRESSION: 1. No evidence of acute intracranial abnormality. 2. Chronic left frontal sinusitis. Left maxillary sinus fluid, correlate for acute sinusitis. Electronically Signed   By: Logan Bores M.D.   On: 04/04/2021 12:15   DG Chest Port 1 View  Result Date: 04/04/2021 CLINICAL DATA:  Shortness of breath EXAM: PORTABLE CHEST 1 VIEW COMPARISON:  Radiograph 01/11/2017 FINDINGS: Unchanged, enlarged cardiac silhouette. Central pulmonary vascular congestion. Mild edema. There is a moderate size layering right pleural effusion with adjacent basilar opacities. Skin fold overlies the left upper chest. No visible pneumothorax. Bilateral shoulder osteoarthritis with findings of probable rotator cuff disease. IMPRESSION: Cardiomegaly with mild pulmonary edema. Moderate layering right pleural effusion with adjacent basilar opacities, likely atelectasis. Electronically Signed   By: Maurine Simmering M.D.   On: 04/04/2021 11:18   ECHOCARDIOGRAM COMPLETE  Result Date: 04/04/2021    ECHOCARDIOGRAM REPORT   Patient Name:   THEOPHIL GUGLIELMO Date of Exam: 04/04/2021 Medical Rec #:  [id number]        Height:       78.0 in Accession #:    [id number]       Weight:       231.0 lb Date of Birth:  09-28-1940        BSA:          2.400 m Patient Age:    81 years         BP:           132/111 mmHg Patient Gender: M                HR:           83 bpm. Exam Location:  Inpatient Procedure: 2D Echo Indications:    congestive heart failure  History:        Patient has prior history of Echocardiogram examinations, most                 recent 05/10/2020. Cardiomyopathy and CHF, Abnormal ECG; Risk                 Factors:Hypertension and Dyslipidemia.  Sonographer:    Johny Chess RDCS Referring Phys: [place]  1. Left ventricular ejection fraction, by estimation, is 20 to 25%. The left ventricle has severely decreased function. The left ventricle demonstrates global hypokinesis.  The left ventricular internal cavity size was moderately to severely dilated. There is moderate left ventricular hypertrophy. Left ventricular diastolic parameters are indeterminate.  2. Right ventricular systolic function is severely reduced. The right ventricular size is severely enlarged. There is moderately elevated pulmonary artery systolic pressure.  3. Left atrial size was severely dilated.  4. Right atrial size was severely dilated.  5. The mitral valve is grossly normal. Severe mitral valve regurgitation.  6. Tricuspid valve regurgitation is severe.  7. The aortic valve is grossly normal. Aortic valve regurgitation is trivial. No aortic stenosis is present. FINDINGS  Left Ventricle: Left ventricular ejection fraction, by estimation, is 20 to 25%. The left ventricle has severely decreased function. The left ventricle demonstrates global hypokinesis. The left ventricular internal cavity size was moderately to severely  dilated. There is moderate left ventricular hypertrophy. Left ventricular diastolic parameters are indeterminate. Right Ventricle: The right ventricular size is severely enlarged. Right vetricular wall thickness was not well visualized. Right ventricular systolic function is severely reduced. There is moderately elevated pulmonary artery systolic pressure. The tricuspid regurgitant velocity is 2.96 m/s, and with an assumed right atrial pressure of 15 mmHg, the estimated right ventricular systolic pressure is [id number] mmHg. Left Atrium: Left atrial size was severely dilated. Right Atrium: Right atrial size was severely dilated. Pericardium: There is no evidence of pericardial effusion. Mitral Valve: The mitral valve is grossly normal. Severe mitral valve regurgitation. Tricuspid Valve: The tricuspid valve is  grossly normal. Tricuspid valve regurgitation is severe. Aortic Valve: The aortic valve is grossly normal. Aortic valve regurgitation is trivial. No aortic stenosis is present. Pulmonic Valve: The pulmonic valve was grossly normal. Pulmonic valve regurgitation is mild. Aorta: The aortic root and ascending aorta are structurally normal, with no evidence of dilitation. IAS/Shunts: The atrial septum is grossly normal.  LEFT VENTRICLE PLAX 2D LVIDd:         7.10 cm LVIDs:         6.10 cm LV PW:         1.30 cm LV IVS:        1.00 cm LVOT diam:     2.10 cm LVOT Area:     3.46 cm  IVC IVC diam: 3.20 cm LEFT ATRIUM              Index        RIGHT ATRIUM           Index LA diam:        5.50 cm  2.29 cm/m   RA Area:     32.10 cm LA Vol (A2C):   150.0 ml 62.50 ml/m  RA Volume:   121.00 ml 50.41 ml/m LA Vol (A4C):   143.0 ml 59.58 ml/m LA Biplane Vol: 154.0 ml 64.16 ml/m   AORTA Ao Root diam: 3.90 cm Ao Asc diam:  3.80 cm TRICUSPID VALVE TR Peak grad:   35.0 mmHg TR Vmax:        296.00 cm/s  SHUNTS Systemic Diam: 2.10 cm Mertie Moores MD Electronically signed by Mertie Moores MD Signature Date/Time: 04/04/2021/5:16:15 PM    Final     Cardiac Studies   TTE 04/05/21 IMPRESSIONS    1. Left ventricular ejection fraction, by estimation, is 20 to 25%. The  left ventricle has severely decreased function. The left ventricle  demonstrates global hypokinesis. The left ventricular internal cavity size  was moderately to severely dilated.  There is moderate left ventricular hypertrophy. Left ventricular diastolic  parameters are  indeterminate.   2. Right ventricular systolic function is severely reduced. The right  ventricular size is severely enlarged. There is moderately elevated  pulmonary artery systolic pressure.   3. Left atrial size was severely dilated.   4. Right atrial size was severely dilated.   5. The mitral valve is grossly normal. Severe mitral valve regurgitation.   6. Tricuspid valve regurgitation  is severe.   7. The aortic valve is grossly normal. Aortic valve regurgitation is  trivial. No aortic stenosis is present.   Patient Profile     81 y.o. male with past medical history of hypertension, CAD, HFrEF, hyperlipidemia who presents to the hospital for dizziness and worsening LE edema, found to be in heart failure exacerbation 2/2 medications nonadherence.  Assessment & Plan    Acute on chronic HFrEF Hypertensive cardiomyopathy CAD In the setting of medication nonadherence.  Repeat echocardiogram showed persistent severe biventricular dysfunction, which is worse than his echo in March 2022. Net negative output overnight.  Weight is trending down.  No signs of cardiogenic shock.  Creatinine stable. -Will continue Lasix gtt 5 mg/h with metolazone BID .  Monitor electrolytes closely. -Will hold off on inotrope -Continue Coreg and Spiro -Continue aspirin   AKI  Presumed cardiorenal in the setting of volume overload.  Kidney function stable  -Continue diuresis -Holding Entresto and [place]   Hyperlipidemia LDL 44 in March -Continue Lipitor 10 mg  For questions or updates, please contact [facility] Please consult [email or web address] for contact info under Cardiology/STEMI.      SignedGaylan Gerold, DO  04/06/2021, 8:07 AM

## 2021-04-06 NOTE — Progress Notes (Addendum)
FPTS Brief Progress Note  S:Patient reports that he is doing better this evening, he is urinating more and denies shortness of breath. He feels like his leg swelling is getting better.    O: BP 113/77 (BP Location: Left Arm)    Pulse 77    Temp 97.9 F (36.6 C) (Oral)    Resp 20    Ht 6\' 7"  (2.007 m)    Wt 114 kg    SpO2 96%    BMI 28.31 kg/m   General: elderly male, NAD, sitting up in bed Extremities: legs wrapped Respiratory: breathing comfortably on room air, speaking in full sentences  A/P: HFrEF Exacerbation - Orders reviewed. Labs for AM ordered, which was adjusted as needed.  - Continuing diuresis at this time - Cardiology is following  , DO 04/06/2021, 11:49 PM PGY-2, [facility] Night Resident  Please page [phone] with questions.

## 2021-04-06 NOTE — TOC Progression Note (Signed)
Transition of Care [facility]) - Progression Note    Patient Details  Name: SARTAJ HOSKIN MRN: [id number] Date of Birth: [birth year]  [facility]) CM/SW Contact  Leone Haven, RN Phone Number: 04/06/2021, 4:24 PM  Clinical Narrative:    NCM spoke with patient at the bedside, he states his daughter lives with him. He has a cane and a Dillie at home.  Per pt eval rec HHPT,  NCM offered choice to patient with Medicare.gov list, he states to call his daughter she is a Engineer, civil (consulting).  NCM called daughter , left phone number for a return call back.  Awaiting call back.         Expected Discharge Plan and Services                                                 Social Determinants of Health (SDOH) Interventions Food Insecurity Interventions: Intervention Not Indicated Financial Strain Interventions: Intervention Not Indicated Housing Interventions: Intervention Not Indicated Transportation Interventions: Intervention Not Indicated  Readmission Risk Interventions No flowsheet data found.

## 2021-04-06 NOTE — TOC Benefit Eligibility Note (Signed)
Patient Teacher, English as a foreign language completed.    The patient is currently admitted and upon discharge could be taking Bidil tablets (Brand Name).  The current 30 day co-pay is, $64.00.   The patient is currently admitted and upon discharge could be taking isosorbide-hydralazine (Bidil) 20-37.5 mg tablets.  The current 30 day co-pay is, $10.00.   The patient is insured through [place] Patient Advocate Specialist [place] Patient Advocate Team Direct Number: [phone]  Fax: [phone]

## 2021-04-06 NOTE — Progress Notes (Signed)
Heart Failure Stewardship Pharmacist Progress Note   PCP: Pcp, No PCP-Cardiologist: Larae Grooms, MD    HPI:  81 yo M with PMH of HTN, CAD, HFrEF, and HLD. He presented to the ED on 2/20 with dizziness, weight gain, dyspnea on exertion, and LE edema. CXR with mild pulmonary edema. An ECHO was done on 2/20 and LVEF is 20-25% with severely reduced RV systolic function. Last ECHO from 04/2020 with LVEF 25-30% and mildly reduced RV. Notable for medication noncompliance.   Current HF Medications: Diuretic: furosemide IV 5mg /hr; metolazone 2.5 mg x 1 Beta Blocker: carvedilol 25 mg BID Aldosterone Antagonist: spironolactone 25 mg daily  Prior to admission HF Medications: Diuretic: furosemide 20 mg every M/F Beta blocker: carvedilol 25 mg BID ACE/ARB/ARNI: Entresto 97/103 mg BID Aldosterone Antagonist: spironolactone 25 mg daily SGLT2i: Farxiga 10 mg daily  Pertinent Lab Values: Serum creatinine 1.71, BUN 26, Potassium 4.2, Sodium 137, BNP >4500, Magnesium 1.9  Vital Signs: Weight: 251 lbs (admission weight: 252 lbs) Blood pressure: 130/80s  Heart rate: 70s  I/O: -81mL yesterday; net -341mL  Medication Assistance / Insurance Benefits Check: Does the patient have prescription insurance?  Yes Type of insurance plan: [employer]  Outpatient Pharmacy:  Prior to admission outpatient pharmacy: [facility] Is the patient willing to use [facility] at discharge? Yes Is the patient willing to transition their outpatient pharmacy to utilize a [facility] outpatient pharmacy?   Pending    Assessment: 1. Acute on chronic systolic CHF (EF 0000000), due to presumed NICM - no R/LHC completed. NYHA class IV symptoms. - Agree with adding metolazone today, consider increasing to furosemide IV to 7.5 mg/hr - minimal output with 5 mg/hr and trial of metolazone yesterday. Weight down 2 lbs.  - Carvedilol 25 mg BID continued - ok to continue as long as he does not show signs of shock. Vitals  ok and renal function stable but remains elevated today - Entresto and Farxiga on hold with renal function and dizziness. SBP up to 130s today. Can resume low dose Entresto. - Continue spironolactone 25 mg daily   Plan: 1) Medication changes recommended at this time: - Consider increasing lasix gtt to 7.5 mg/hr if continued minimal response after metolazone - Consider restarting low dose Entresto with SBP 130s - If needed, can obtain PICC/coox monitoring if concerned for low output HF  2) Patient assistance: - Investigate reason for medication noncompliance   3)  Education  - To be completed prior to discharge  Kerby Nora, PharmD, BCPS Heart Failure Stewardship Pharmacist Phone [phone]

## 2021-04-06 NOTE — Progress Notes (Addendum)
Family Medicine Teaching Service Daily Progress Note Intern Pager: [phone]  Patient name: Matthew [name] Medical record number: [id number] Date of birth: 07/11/1940 Age: 81 y.o. Gender: male  Primary Care Provider: Pcp, No Consultants: Cardiology Code Status: Full  Pt Overview and Major Events to Date:  2/20- admitted  Assessment and Plan: Matthew [name] is a 81 y.o. male presenting with HFrEF exacerbation in the setting of non-adherence to his HF meds. PMH is significant for HFrEF (EF 25-30%), hypertension, Osteoarthritis of the bilateral knees.   HFrEF Exacerbation VSS overnight. Remains comfortable on room air. Beginning to have better UOP with Lasix gtt and metolazone x1, though remains suboptimal with only 1.3L out yesterday, for a net output of - . Suspect he is intravascularly depleted, making it difficult to remove fluid from his lower extremities. We tried elevating his feet and wrapping his LE yesterday which perhaps has helped a bit as edema now extends to mid shin rather than knee. K 4.2. Mg 1.9.  - Cards following, appreciate their assistance with diuresis - Diuresis per cardiology - Continue Coreg and [place]a Daub - Continue ASA - Holding [place] and [place] with bumped Cr - Elevate BLE, ACE Wraps  Suspected UTI Patient complained of dysuria during interview with cardiology yesterday, cards ordered UA with large leukocytes, WBC >50, and rare bacteria.  - Given symptoms, will treat with fosfomycin 3g  Onychogrphyphosis Weeping wounds to RLE Seen by [facility] nurse this morning with dressing recommendations for his wounds. - Podiatry to perform nail care this am - Once nails have been trimmed, can apply compression stockings for above issue -Wound care per WOC  AKI Cr stable at 1.7. Baseline 1.1.  -Diuresis as above -Monitor on BMP  Anemia of chronic disease, stable  FEN/GI: Heart healthy PPx: Lovenox Dispo:Pending PT recommendations  in 2-3 days. Barriers include  further diuresis.   Subjective:  Matthew [name] reports feeling generally well this morning.  He has no acute complaints.  He is eager to go home when able.  Objective: Temp:  [97.4 F (36.3 C)-98.3 F (36.8 C)] 97.4 F (36.3 C) (02/22 0426) Pulse Rate:  [71-90] 74 (02/22 0426) Resp:  [18-22] 20 (02/22 0426) BP: (108-133)/(86-95) 133/95 (02/22 0426) SpO2:  [93 %-100 %] 100 % (02/22 0426) Weight:  [250 kg] 114 kg (02/22 0412) Physical Exam: General: Awake, sitting up in bed eating breakfast Cardiovascular: Irregular rhythm, 3/6 systolic murmur, soft S3 today Respiratory: Normal work of breathing on room air, lung sounds somewhat diminished in right base Abdomen: Nontender, nondistended Extremities: 2+ pitting edema to midshin bilaterally, improved from my previous exam.  Dressing over wound on right shin  Laboratory: Recent Labs  Lab 04/04/21 1251 04/05/21 0209 04/06/21 0404  WBC 5.7 6.4 6.1  HGB 11.7* 11.5* 11.7*  HCT 36.9* 36.7* 37.1*  PLT 116* 122* 129*   Recent Labs  Lab 04/04/21 1251 04/05/21 0209 04/05/21 1858 04/06/21 0404  NA 136 138 137 137  K 3.8 4.0 4.5 4.2  CL 107 106 108 104  CO2 19* 21* 18* 20*  BUN 23 25* 27* 26*  CREATININE 1.60* 1.70* 1.73* 1.71*  CALCIUM 8.7* 8.8* 8.6* 8.9  PROT 7.0  --   --   --   BILITOT 0.9  --   --   --   ALKPHOS 95  --   --   --   ALT 15  --   --   --   AST 20  --   --   --  GLUCOSE 88 106* 111* 111*    Imaging/Diagnostic Tests: No new imaging, tests  Eppie Gibson, MD 04/06/2021, 6:19 AM PGY-1, [place] Intern pager: [phone], text pages welcome

## 2021-04-06 NOTE — Consult Note (Signed)
WOC Nurse Consult Note: Reason for Consult: RLE full thickness wounds, secondary to edema Wound type: venous insufficiency, suspected trauma also Pressure Injury POA: N/A Measurement:Three full thickness wounds, two are anterior, one is lateral. Anterior (central): 4cm x 3cm x 0.2 and medial to that another that measures 2cm x 1cm x 0.1cm Lateral LE:  2cm x 3cm x 0.2cm Wound bed: Red, moist Drainage (amount, consistency, odor) small serous Periwound: edematous Dressing procedure/placement/frequency: I will provide Nursing with topical care guidance for wounds as patient is being diuresed consisting of xeroform gauze wound contact layer topped with dry gauze and covered with ABD pad secured with Kerlix roll gazue wrapped from toes to knee and topped with ACE bandage wrapped in a similar manner. Feet are to be placed in Prevalon Boots for PI prevention as well as a silicone foam dressing placed to the sacrum. A pressure redistribution chair pad is provided for OOB use.  WOC nursing team will not follow, but will remain available to this patient, the nursing and medical teams.  Please re-consult if needed. Thanks, Ladona Mow, MSN, RN, GNP, [place]  Pager# [phone]

## 2021-04-07 ENCOUNTER — Other Ambulatory Visit (HOSPITAL_COMMUNITY): Payer: Self-pay

## 2021-04-07 ENCOUNTER — Telehealth: Payer: Self-pay | Admitting: Interventional Cardiology

## 2021-04-07 LAB — BASIC METABOLIC PANEL
Anion gap: 9 (ref 5–15)
BUN: 27 mg/dL — ABNORMAL HIGH (ref 8–23)
CO2: 24 mmol/L (ref 22–32)
Calcium: 8.8 mg/dL — ABNORMAL LOW (ref 8.9–10.3)
Chloride: 103 mmol/L (ref 98–111)
Creatinine, Ser: 1.69 mg/dL — ABNORMAL HIGH (ref 0.61–1.24)
GFR, Estimated: 41 mL/min — ABNORMAL LOW (ref >60)
Glucose, Bld: 94 mg/dL (ref 70–99)
Potassium: 4.1 mmol/L (ref 3.5–5.1)
Sodium: 136 mmol/L (ref 135–145)

## 2021-04-07 LAB — MAGNESIUM: Magnesium: 1.9 mg/dL (ref 1.7–2.4)

## 2021-04-07 MED ORDER — MAGNESIUM SULFATE 2 GM/50ML IV SOLN
2.0000 g | Freq: Once | INTRAVENOUS | Status: AC
  Administered 2021-04-07: 2 g via INTRAVENOUS
  Filled 2021-04-07: qty 50

## 2021-04-07 MED ORDER — SODIUM CHLORIDE 0.9 % IV SOLN
510.0000 mg | Freq: Once | INTRAVENOUS | Status: AC
  Administered 2021-04-07: 510 mg via INTRAVENOUS
  Filled 2021-04-07: qty 17

## 2021-04-07 MED ORDER — FUROSEMIDE 20 MG PO TABS
20.0000 mg | ORAL_TABLET | Freq: Two times a day (BID) | ORAL | Status: DC
  Administered 2021-04-07 – 2021-04-08 (×3): 20 mg via ORAL
  Filled 2021-04-07 (×3): qty 1

## 2021-04-07 NOTE — Progress Notes (Addendum)
Progress Note  Patient Name: Matthew [name] Date of Encounter: 04/07/2021  Primary Cardiologist: Lance Muss, MD   Subjective   Patient is seen at bedside. He reports good urine output overnight. States that his LE edema improved significantly.   Inpatient Medications    Scheduled Meds:  aspirin EC  81 mg Oral Daily   atorvastatin  10 mg Oral Daily   carvedilol  25 mg Oral BID WC   dapagliflozin propanediol  10 mg Oral Daily   enoxaparin (LOVENOX) injection  40 mg Subcutaneous Q24H   isosorbide-hydrALAZINE  1 tablet Oral TID   nystatin cream   Topical BID   sodium chloride flush  3 mL Intravenous Q12H   spironolactone  25 mg Oral Daily   Continuous Infusions:  sodium chloride     furosemide (LASIX) 200 mg in dextrose 5% 100 mL (2mg /mL) infusion 7.5 mg/hr (04/06/21 1710)   magnesium sulfate bolus IVPB     PRN Meds: sodium chloride, acetaminophen, sodium chloride flush   Vital Signs    Vitals:   04/06/21 1647 04/06/21 2011 04/06/21 2302 04/07/21 0319  BP: 124/87 102/68 113/77 112/79  Pulse: 77   72  Resp: 19 20 20 20   Temp: 97.6 F (36.4 C) 97.9 F (36.6 C)  (!) 97.3 F (36.3 C)  TempSrc: Oral Oral  Oral  SpO2: 96% 96%  99%  Weight:    109.7 kg  Height:        Intake/Output Summary (Last 24 hours) at 04/07/2021 0835 Last data filed at 04/07/2021 0831 Gross per 24 hour  Intake 1016.86 ml  Output 4650 ml  Net -3633.14 ml   Filed Weights   04/05/21 0100 04/06/21 0412 04/07/21 0319  Weight: 115.2 kg 114 kg 109.7 kg    Telemetry    NSR, high burden PVCs - Personally Reviewed  ECG    2/20: Sinus rhythm, PVC, ventricular bigeminy, bifascicular block- Personally Reviewed  Physical Exam   GEN: No acute distress.   Neck: No JVD Cardiac: RRR, no murmurs, rubs, or gallops.  Respiratory: Improved air movement in the lung bases compared to yesterday MS: Improve LE edema. Legs wrapped Neuro:  Nonfocal  Psych: Normal affect   Labs     Chemistry Recent Labs  Lab 04/04/21 1251 04/05/21 0209 04/06/21 0404 04/06/21 1443 04/07/21 0414  NA 136   < > 137 137 136  K 3.8   < > 4.2 4.5 4.1  CL 107   < > 104 107 103  CO2 19*   < > 20* 21* 24  GLUCOSE 88   < > 111* 104* 94  BUN 23   < > 26* 28* 27*  CREATININE 1.60*   < > 1.71* 1.72* 1.69*  CALCIUM 8.7*   < > 8.9 8.8* 8.8*  PROT 7.0  --   --   --   --   ALBUMIN 3.0*  --   --   --   --   AST 20  --   --   --   --   ALT 15  --   --   --   --   ALKPHOS 95  --   --   --   --   BILITOT 0.9  --   --   --   --   GFRNONAA 43*   < > 40* 40* 41*  ANIONGAP 10   < > 13 9 9    < > = values in this interval not  displayed.     Hematology Recent Labs  Lab 04/04/21 1251 04/05/21 0209 04/06/21 0404  WBC 5.7 6.4 6.1  RBC 4.69 4.60 4.77  HGB 11.7* 11.5* 11.7*  HCT 36.9* 36.7* 37.1*  MCV 78.7* 79.8* 77.8*  MCH 24.9* 25.0* 24.5*  MCHC 31.7 31.3 31.5  RDW 19.8* 20.0* 19.6*  PLT 116* 122* 129*    Cardiac EnzymesNo results for input(s): TROPONINI in the last 168 hours. No results for input(s): TROPIPOC in the last 168 hours.   BNP Recent Labs  Lab 04/04/21 1251  BNP >4,500.0*     DDimer No results for input(s): DDIMER in the last 168 hours.   Radiology    No results found.  Cardiac Studies   TTE 04/05/21 IMPRESSIONS    1. Left ventricular ejection fraction, by estimation, is 20 to 25%. The  left ventricle has severely decreased function. The left ventricle  demonstrates global hypokinesis. The left ventricular internal cavity size  was moderately to severely dilated.  There is moderate left ventricular hypertrophy. Left ventricular diastolic  parameters are indeterminate.   2. Right ventricular systolic function is severely reduced. The right  ventricular size is severely enlarged. There is moderately elevated  pulmonary artery systolic pressure.   3. Left atrial size was severely dilated.   4. Right atrial size was severely dilated.   5. The mitral valve is  grossly normal. Severe mitral valve regurgitation.   6. Tricuspid valve regurgitation is severe.   7. The aortic valve is grossly normal. Aortic valve regurgitation is  trivial. No aortic stenosis is present.   Patient Profile     81 y.o. male with past medical history of hypertension, CAD, HFrEF, hyperlipidemia who presents to the hospital for dizziness and worsening LE edema, found to be in heart failure exacerbation 2/2 medications nonadherence.  Assessment & Plan    Acute on chronic HFrEF Hypertensive cardiomyopathy CAD In the setting of medication nonadherence.  Repeat echocardiogram showed persistent severe biventricular dysfunction, which is worse than his echo in March 2022. Good urine output overnight. His respiratory and LE edema continue to improve with stable kidney function. Looks like he is almost back to his dry weight. Will transition to lasix 20 PO BID. -Monitor electrolytes closely. -Will hold off on inotrope -Continue Coreg, Bidil, farxiga and Spiro. Blood pressure remains in good range.  -If kidney function ok, will resume entresto tomorrow -Continue aspirin -Repeat Echo tomorrow to assess his MR   AKI  Presumed cardiorenal in the setting of volume overload.  Kidney function stable  -Continue diuresis   Hyperlipidemia LDL 44 in March -Continue Lipitor 10 mg  IDA -1 dose of Feraheme today   For questions or updates, please contact [facility] Please consult [email or web address] for contact info under Cardiology/STEMI.      Signed, Doran Stabler, DO  04/07/2021, 8:35 AM     ATTENDING ATTESTATION:  After conducting a review of all available clinical information with the care team, interviewing the patient, and performing a physical exam, I agree with the findings and plan described in this note.     GEN: No acute distress.   Cardiac: RRR, mild holosystolic murmur, rubs, or gallops. No JVD Respiratory: Clear to auscultation bilaterally. GI: Soft, nontender,  non-distended  MS: Mild edema; No deformity. Neuro:  Nonfocal  Vasc:  +2 radial pulses  Patient is doing well after aggressive diuresis.  Given that patient was on minimal lasix as outpatient, will transition to lasix 20 PO BID.  Will  hold off on RHC.  Patient is well afterload-reduced on Coreg, Bidil, Farxiga, Spirolactone.  TTE tomorrow to assess MR, EF.  Alverda Skeans, MD Pager [phone]

## 2021-04-07 NOTE — Progress Notes (Addendum)
Mobility Specialist Progress Note:   04/07/21 1025  Mobility  Activity Transferred from bed to chair  Level of Assistance Standby assist, set-up cues, supervision of patient - no hands on  Assistive Device Front wheel Afshar  Distance Ambulated (ft) 2 ft  Activity Response Tolerated well  $Mobility charge 1 Mobility   Pt required max encouragement to participate in mobility session this am. Declined ambulating d/t BLE pain, pt agreeable to sit in chair. Not requiring any physical assistance throughout session, only verbal cues to square up to chair before sitting. Pt left in chair with all needs met.   Nelta Numbers Acute Rehab Phone: [phone] Office Phone: [phone]

## 2021-04-07 NOTE — Progress Notes (Signed)
Family Medicine Teaching Service Daily Progress Note Intern Pager: [phone]  Patient name: Matthew [name] Medical record number: [id number] Date of birth: 1941-01-12 Age: 81 y.o. Gender: male  Primary Care Provider: Pcp, No Consultants: Cardiology Code Status: Full  Pt Overview and Major Events to Date:  2/20- admitted  Assessment and Plan: Matthew [name] is a 81 y.o. male presenting with HFrEF exacerbation in the setting of non-adherence to his HF meds. PMH is significant for HFrEF (EF 25-30%), hypertension, Osteoarthritis of the bilateral knees.   HFrEF Exacerbation Did very well overnight. VSS. Put out 4.2L yesterday for a net negative 3.5L. Legs have been elevated and wrapped with ACE bandage and compression stockings have been ordered. Cr is stable at 1.69. K 4.1  - Cardiology following, considering RHC once euvolemic, tentatively tomorrow - NPO at MN - Diuresis per cards - Continue Coreg, Arlyce Harman, Farxiga, and Bidil - [employer] in setting of bumped Cr, may restart tomorrow if Cr stable - Compression stockings  UTI, s/p abx Pt received fosfomycin x1 yesterday for UTI. - Monitor for recurrence of symptoms  Onychogryphosis Weeping wounds to RLE Nail care done by podiatry yesterday, patient should be able to wear compression stockings now. - Wound care per WOC  AKI Cr stable at 1.69. Baseline 1.1 - Diuresis as above - Monitor on BMP  Anemia of chronic disease, stable - Will initiate oral iron therapy upon discharge given HF  FEN/GI: Heart healthy PPx: Lovenox Dispo:Home with home health  in 2-3 days. Barriers include further diuresis and possible RHC.   Subjective:  Matthew [name] feels generally well this morning.  He says that the swelling in his legs is much improved.  He is pleased that the podiatry service was able to conduct nail care in the hospital in order to get compression stockings on him.  Objective: Temp:  [97.3 F (36.3 C)-97.9 F (36.6 C)]  97.3 F (36.3 C) (02/23 0319) Pulse Rate:  [72-77] 72 (02/23 0319) Resp:  [19-23] 20 (02/23 0319) BP: (102-133)/(68-96) 112/79 (02/23 0319) SpO2:  [95 %-99 %] 99 % (02/23 0319) Weight:  [109.7 kg] 109.7 kg (02/23 0319) Physical Exam: General: Awake, alert, sitting up in bed watching television Cardiovascular: Regular rate and rhythm, 2/6 systolic murmur, less pronounced compared to previous, no S3 heard today, and improvement from previous Respiratory: Normal WOB on RA, lungs clear to auscultation Abdomen: Soft, non-tender, non-distended Extremities: BLE with pedal edema, compression stocking in place bilaterally, nails s/p sharp debridement without bleeding  Laboratory: Recent Labs  Lab 04/04/21 1251 04/05/21 0209 04/06/21 0404  WBC 5.7 6.4 6.1  HGB 11.7* 11.5* 11.7*  HCT 36.9* 36.7* 37.1*  PLT 116* 122* 129*   Recent Labs  Lab 04/04/21 1251 04/05/21 0209 04/05/21 1858 04/06/21 0404 04/06/21 1443  NA 136   < > 137 137 137  K 3.8   < > 4.5 4.2 4.5  CL 107   < > 108 104 107  CO2 19*   < > 18* 20* 21*  BUN 23   < > 27* 26* 28*  CREATININE 1.60*   < > 1.73* 1.71* 1.72*  CALCIUM 8.7*   < > 8.6* 8.9 8.8*  PROT 7.0  --   --   --   --   BILITOT 0.9  --   --   --   --   ALKPHOS 95  --   --   --   --   ALT 15  --   --   --   --  AST 20  --   --   --   --   GLUCOSE 88   < > 111* 111* 104*   < > = values in this interval not displayed.    Imaging/Diagnostic Tests: No new imaging, tests  Eppie Gibson, MD 04/07/2021, 6:07 AM PGY-1, [place] Intern pager: [phone], text pages welcome

## 2021-04-07 NOTE — Progress Notes (Signed)
FPTS Brief Progress Note  S: Patient sleeping, did not disturb  O: BP 109/75 (BP Location: Left Arm)    Pulse 73    Temp (!) 97.5 F (36.4 C) (Oral)    Resp 17    Ht 6\' 7"  (2.007 m)    Wt 109.7 kg    SpO2 100%    BMI 27.25 kg/m   General: NAD elderly male, sleeping in bed Respiratory: breathing comfortably on room air   A/P: HFrEF Exacerbation  - Orders reviewed. Labs for AM ordered, which was adjusted as needed.  - No current changes to the plan, review daily progress note - Continuing diuresis per cardiology   , DO 04/07/2021, 11:28 PM PGY-2,  Family Medicine Night Resident  Please page [phone] with questions.

## 2021-04-07 NOTE — Progress Notes (Addendum)
Patient blood pressure 80s/50s. RN paged resident MD and cardiology PA. Dr. Cyndie Chime came to the bedside. Patient's BP was 91/50s. RN advised to page again if BP drops below 90 systolic. Patient's BP is currently 76/50. Cardiology and Resident Mds are aware. Patient Is asymptomatic. Aronda Burford B [place], RN

## 2021-04-07 NOTE — Progress Notes (Addendum)
Paged for BP 76/50. When evaluated at bedside, patient was asymptomatic. Repeat BP 85/54. Patient is euvolemic.  Will hold his PM dose of Bidil and Coreg. Switch to PO lasix 20 mg BID with the next dose at 1800. Encourage PO intake. Goal SBP 80-110 as long as he is asymptomatic.

## 2021-04-07 NOTE — Telephone Encounter (Signed)
° °  Pt is calling, he said he is in the hospital and the doctor wanted him to get cat scan and he doesn't want to do that, he said to call his daughter to explain everything

## 2021-04-07 NOTE — Progress Notes (Addendum)
Heart Failure Stewardship Pharmacist Progress Note   PCP: Pcp, No PCP-Cardiologist: Larae Grooms, MD    HPI:  81 yo M with PMH of HTN, CAD, HFrEF, and HLD. He presented to the ED on 2/20 with dizziness, weight gain, dyspnea on exertion, and LE edema. CXR with mild pulmonary edema. An ECHO was done on 2/20 and LVEF is 20-25% with severely reduced RV systolic function. Last ECHO from 04/2020 with LVEF 25-30% and mildly reduced RV. Notable for medication noncompliance.   Current HF Medications: Diuretic: furosemide IV 7.5 mg/hr Beta Blocker: carvedilol 25 mg BID Aldosterone Antagonist: spironolactone 25 mg daily SGLT2i: Farxiga 10 mg daily Other: BiDil 1 tab TID  Prior to admission HF Medications: Diuretic: furosemide 20 mg every M/F Beta blocker: carvedilol 25 mg BID ACE/ARB/ARNI: Entresto 97/103 mg BID Aldosterone Antagonist: spironolactone 25 mg daily SGLT2i: Farxiga 10 mg daily  Pertinent Lab Values: Serum creatinine 1.69, BUN 27, Potassium 4.1, Sodium 136, BNP >4500, Magnesium 1.9 Ferritin 110; TSAT 7  Vital Signs: Weight: 241 lbs (admission weight: 252 lbs) Blood pressure: 110/70s  Heart rate: 70s  I/O: -2.5L yesterday; net -4.6L  Medication Assistance / Insurance Benefits Check: Does the patient have prescription insurance?  Yes Type of insurance plan: [employer]  Outpatient Pharmacy:  Prior to admission outpatient pharmacy: [facility] Is the patient willing to use [facility] at discharge? Yes Is the patient willing to transition their outpatient pharmacy to utilize a [facility] outpatient pharmacy?   Pending    Assessment: 1. Acute on chronic systolic CHF (EF 0000000), due to presumed NICM - no R/LHC completed. NYHA class IV symptoms. - Good urine output and weight loss with lasix gtt bumped to 7.5 mg/hr. If transitioning to PO, recommend starting furosemide 40 mg daily. Was taking furosemide 20 mg two days per week PTA. - Continue carvedilol 25 mg BID   - Consider resuming Entresto tomorrow if SCr remains stable - Continue spironolactone 25 mg daily - Continue Farxiga 10 mg daily - Continue BiDil 1 tab TID (generic equivalent if available) - Consider giving IV iron while still inpatient given ferritin 100-199 and TSAT <20   Plan: 1) Medication changes recommended at this time: - Consider furosemide 40 mg PO daily when transitioning to PO - Consider discharging on GENERIC BiDil as the copay is much less per month - Consider IV iron (Feraheme 510 mg) while still inpatient given ferritin 100-199 and TSAT <20  2) Patient assistance: - Investigate reason for medication noncompliance  - BiDil copay $64 (if generic available, copay is only $10)  3)  Education  - To be completed prior to discharge  Kerby Nora, PharmD, BCPS Heart Failure Stewardship Pharmacist Phone [phone]

## 2021-04-07 NOTE — Progress Notes (Signed)
Physical Therapy Treatment Patient Details Name: Matthew [name] MRN: [id number] DOB: 10-06-1940 Today's Date: 04/07/2021   History of Present Illness 81 y.o. male admitted 2/20 who presents to the hospital for dizziness and worsening LE edema, found to be in heart failure exacerbation.  PMH:  hypertension, CAD, HFrEF, hyperlipidemia    PT Comments    The pt was agreeable to session initially, and completed a series of LE exercises and movements with good technique and force production. However, the pt then self-limited progression of OOB mobility and activity due to reports of pain in RLE after dressing change. The pt will continue to benefit from skilled PT acutely and following d/c to maximize endurance and functional independence with transfers and household distance ambulation.     Recommendations for follow up therapy are one component of a multi-disciplinary discharge planning process, led by the attending physician.  Recommendations may be updated based on patient status, additional functional criteria and insurance authorization.  Follow Up Recommendations  Home health PT     Assistance Recommended at Discharge Intermittent Supervision/Assistance  Patient can return home with the following A lot of help with walking and/or transfers;A little help with bathing/dressing/bathroom;Help with stairs or ramp for entrance   Equipment Recommendations  None recommended by PT    Recommendations for Other Services       Precautions / Restrictions Precautions Precautions: Fall Precaution Comments: soft BP, bilateral thigh compression socks Restrictions Weight Bearing Restrictions: No     Mobility  Bed Mobility Overal bed mobility: Needs Assistance             General bed mobility comments: pt OOB in recliner    Transfers Overall transfer level: Needs assistance                 General transfer comment: declined sit-stand today due to pain in R foot        Balance Overall balance assessment: Needs assistance Sitting-balance support: No upper extremity supported, Feet supported Sitting balance-[name] Scale: Fair                                      Cognition Arousal/Alertness: Awake/alert Behavior During Therapy: Agitated (HOH) Overall Cognitive Status: Impaired/Different from baseline Area of Impairment: Memory, Awareness                     Memory: Decreased short-term memory     Awareness: Intellectual   General Comments: pt self-limiting and making statements such as "If you had brought a Hlad I would have stood up" and then when [name] offered pt stating "not that one" and then "don't bother now". Pt declining mobility due to pain, stating he can do it once his daughter is present        Exercises General Exercises - Lower Extremity Long Arc Quad: AROM, Both, 10 reps, Seated Straight Leg Raises: AROM, Both, 10 reps, Seated Heel Raises: AROM, Both, 15 reps, Seated    General Comments General comments (skin integrity, edema, etc.): BP 87/54 sitting in chair, pt denies sx      Pertinent Vitals/Pain Pain Assessment Pain Assessment: Faces Faces Pain Scale: Hurts even more Pain Location: RLE Pain Descriptors / Indicators: Discomfort Pain Intervention(s): Limited activity within patient's tolerance, Monitored during session, Repositioned     PT Goals (current goals can now be found in the care plan section) Acute Rehab PT Goals Patient Stated  Goal: to go home PT Goal Formulation: With patient Time For Goal Achievement: 04/20/21 Potential to Achieve Goals: Good Progress towards PT goals: Progressing toward goals    Frequency    Min 3X/week      PT Plan Current plan remains appropriate       AM-PAC PT "6 Clicks" Mobility   Outcome Measure  Help needed turning from your back to your side while in a flat bed without using bedrails?: A Little Help needed moving from lying on your back  to sitting on the side of a flat bed without using bedrails?: A Little Help needed moving to and from a bed to a chair (including a wheelchair)?: A Little Help needed standing up from a chair using your arms (e.g., wheelchair or bedside chair)?: A Lot Help needed to walk in hospital room?: Total Help needed climbing 3-5 steps with a railing? : Total 6 Click Score: 13    End of Session   Activity Tolerance: Patient limited by pain Patient left: in chair;with call bell/phone within reach;with chair alarm set Nurse Communication: Mobility status PT Visit Diagnosis: Unsteadiness on feet (R26.81);Muscle weakness (generalized) (M62.81)     Time: 4098-1191 PT Time Calculation (min) (ACUTE ONLY): 12 min  Charges:  $Therapeutic Exercise: 8-22 mins                     Vickki Muff, PT, DPT   Acute Rehabilitation Department Pager #: [phone]   Ronnie Derby 04/07/2021, 5:12 PM

## 2021-04-07 NOTE — Telephone Encounter (Signed)
I spoke with patient's daughter and told her that patient asked we contact her.  Daughter reports she has spoken with doctor from the hospital.  I asked her to follow up with hospital doctor's treating patient if any additional questions.

## 2021-04-08 ENCOUNTER — Inpatient Hospital Stay (HOSPITAL_COMMUNITY): Payer: Medicare PPO

## 2021-04-08 ENCOUNTER — Other Ambulatory Visit (HOSPITAL_COMMUNITY): Payer: Self-pay

## 2021-04-08 DIAGNOSIS — I340 Nonrheumatic mitral (valve) insufficiency: Secondary | ICD-10-CM

## 2021-04-08 LAB — ECHOCARDIOGRAM LIMITED
AR max vel: 2.38 cm2
AV Peak grad: 8.9 mmHg
Ao pk vel: 1.49 m/s
Area-P 1/2: 4.39 cm2
Calc EF: 34.5 %
Height: 79 in
MV M vel: 4.44 m/s
MV Peak grad: 78.9 mmHg
MV VTI: 2.59 cm2
S' Lateral: 5.6 cm
Single Plane A2C EF: 39.1 %
Single Plane A4C EF: 32.5 %
Weight: 3651.2 oz

## 2021-04-08 LAB — BASIC METABOLIC PANEL
Anion gap: 10 (ref 5–15)
BUN: 27 mg/dL — ABNORMAL HIGH (ref 8–23)
CO2: 25 mmol/L (ref 22–32)
Calcium: 8.7 mg/dL — ABNORMAL LOW (ref 8.9–10.3)
Chloride: 100 mmol/L (ref 98–111)
Creatinine, Ser: 1.55 mg/dL — ABNORMAL HIGH (ref 0.61–1.24)
GFR, Estimated: 45 mL/min — ABNORMAL LOW (ref >60)
Glucose, Bld: 91 mg/dL (ref 70–99)
Potassium: 4.1 mmol/L (ref 3.5–5.1)
Sodium: 135 mmol/L (ref 135–145)

## 2021-04-08 LAB — MAGNESIUM: Magnesium: 2 mg/dL (ref 1.7–2.4)

## 2021-04-08 LAB — CBC
HCT: 35.5 % — ABNORMAL LOW (ref 39.0–52.0)
Hemoglobin: 11.6 g/dL — ABNORMAL LOW (ref 13.0–17.0)
MCH: 24.7 pg — ABNORMAL LOW (ref 26.0–34.0)
MCHC: 32.7 g/dL (ref 30.0–36.0)
MCV: 75.5 fL — ABNORMAL LOW (ref 80.0–100.0)
Platelets: 140 10*3/uL — ABNORMAL LOW (ref 150–400)
RBC: 4.7 MIL/uL (ref 4.22–5.81)
RDW: 19.1 % — ABNORMAL HIGH (ref 11.5–15.5)
WBC: 5.7 10*3/uL (ref 4.0–10.5)
nRBC: 0 % (ref 0.0–0.2)

## 2021-04-08 MED ORDER — SPIRONOLACTONE 25 MG PO TABS
25.0000 mg | ORAL_TABLET | Freq: Every day | ORAL | Status: DC
Start: 2021-04-08 — End: 2021-04-08
  Administered 2021-04-08: 25 mg via ORAL
  Filled 2021-04-08: qty 1

## 2021-04-08 MED ORDER — SACUBITRIL-VALSARTAN 24-26 MG PO TABS
1.0000 | ORAL_TABLET | Freq: Two times a day (BID) | ORAL | Status: DC
  Administered 2021-04-08: 1 via ORAL
  Filled 2021-04-08: qty 1

## 2021-04-08 MED ORDER — METOPROLOL SUCCINATE ER 25 MG PO TB24
37.5000 mg | ORAL_TABLET | Freq: Every day | ORAL | 0 refills | Status: DC
  Filled 2021-04-08: qty 45, 30d supply, fill #0

## 2021-04-08 MED ORDER — CARVEDILOL 25 MG PO TABS
25.0000 mg | ORAL_TABLET | Freq: Two times a day (BID) | ORAL | Status: DC
Start: 2021-04-08 — End: 2021-04-08
  Administered 2021-04-08: 25 mg via ORAL
  Filled 2021-04-08: qty 1

## 2021-04-08 MED ORDER — ISOSORB DINITRATE-HYDRALAZINE 20-37.5 MG PO TABS
1.0000 | ORAL_TABLET | Freq: Three times a day (TID) | ORAL | 0 refills | Status: DC
  Filled 2021-04-08: qty 90, 30d supply, fill #0

## 2021-04-08 MED ORDER — NYSTATIN 100000 UNIT/GM EX CREA
TOPICAL_CREAM | Freq: Two times a day (BID) | CUTANEOUS | 0 refills | Status: AC
  Filled 2021-04-08: qty 30, 4d supply, fill #0

## 2021-04-08 MED ORDER — METOPROLOL SUCCINATE ER 25 MG PO TB24: 12.5000 mg | ORAL_TABLET | Freq: Every day | ORAL | Status: DC

## 2021-04-08 MED ORDER — FUROSEMIDE 20 MG PO TABS
20.0000 mg | ORAL_TABLET | Freq: Two times a day (BID) | ORAL | 0 refills | Status: DC
  Filled 2021-04-08: qty 60, 30d supply, fill #0

## 2021-04-08 MED ORDER — SPIRONOLACTONE 25 MG PO TABS: 25.0000 mg | ORAL_TABLET | Freq: Every day | ORAL | Status: DC

## 2021-04-08 MED ORDER — METOPROLOL SUCCINATE ER 25 MG PO TB24: 37.5000 mg | ORAL_TABLET | Freq: Every day | ORAL | Status: DC

## 2021-04-08 MED ORDER — SPIRONOLACTONE 25 MG PO TABS
25.0000 mg | ORAL_TABLET | Freq: Every day | ORAL | 0 refills | Status: DC
  Filled 2021-04-08: qty 30, 30d supply, fill #0

## 2021-04-08 MED ORDER — CARVEDILOL 25 MG PO TABS: 25.0000 mg | ORAL_TABLET | Freq: Two times a day (BID) | ORAL | Status: DC

## 2021-04-08 MED ORDER — SACUBITRIL-VALSARTAN 24-26 MG PO TABS
1.0000 | ORAL_TABLET | Freq: Two times a day (BID) | ORAL | 0 refills | Status: DC
  Filled 2021-04-08: qty 60, 30d supply, fill #0

## 2021-04-08 NOTE — Progress Notes (Addendum)
Progress Note  Patient Name: Matthew [name] Date of Encounter: 04/08/2021  Primary Cardiologist: Lance Muss, MD   Subjective   Patient is seen at bedside. He denies CP or SOB. LE edema has improved significantly.   Inpatient Medications    Scheduled Meds:  aspirin EC  81 mg Oral Daily   atorvastatin  10 mg Oral Daily   carvedilol  25 mg Oral BID WC   dapagliflozin propanediol  10 mg Oral Daily   enoxaparin (LOVENOX) injection  40 mg Subcutaneous Q24H   furosemide  20 mg Oral BID   nystatin cream   Topical BID   sodium chloride flush  3 mL Intravenous Q12H   spironolactone  25 mg Oral Daily   Continuous Infusions:  sodium chloride     PRN Meds: sodium chloride, acetaminophen, sodium chloride flush   Vital Signs    Vitals:   04/07/21 1734 04/07/21 2001 04/08/21 0348 04/08/21 0349  BP: 103/64 109/75 118/75   Pulse: 64 73 76   Resp: 20 17 (!) 23   Temp:  (!) 97.5 F (36.4 C)  97.6 F (36.4 C)  TempSrc:  Oral  Oral  SpO2: 97% 100% 97%   Weight:    103.5 kg  Height:        Intake/Output Summary (Last 24 hours) at 04/08/2021 0800 Last data filed at 04/08/2021 0700 Gross per 24 hour  Intake 757 ml  Output 4085 ml  Net -3328 ml   Filed Weights   04/06/21 0412 04/07/21 0319 04/08/21 0349  Weight: 114 kg 109.7 kg 103.5 kg    Telemetry    NSR, high burden PVCs - Personally Reviewed  ECG    2/20: Sinus rhythm, PVC, ventricular bigeminy, bifascicular block- Personally Reviewed  Physical Exam   GEN: No acute distress.   Neck: No JVD Cardiac: RRR, no murmurs, rubs, or gallops.  Respiratory: Clear to auscultation bilaterally. GI: Soft, nontender, non-distended  MS: legs wrapped. Trace edema  Neuro:  Nonfocal  Psych: Normal affect   Labs    Chemistry Recent Labs  Lab 04/04/21 1251 04/05/21 0209 04/06/21 1443 04/07/21 0414 04/08/21 0535  NA 136   < > 137 136 135  K 3.8   < > 4.5 4.1 4.1  CL 107   < > 107 103 100  CO2 19*   < > 21* 24 25   GLUCOSE 88   < > 104* 94 91  BUN 23   < > 28* 27* 27*  CREATININE 1.60*   < > 1.72* 1.69* 1.55*  CALCIUM 8.7*   < > 8.8* 8.8* 8.7*  PROT 7.0  --   --   --   --   ALBUMIN 3.0*  --   --   --   --   AST 20  --   --   --   --   ALT 15  --   --   --   --   ALKPHOS 95  --   --   --   --   BILITOT 0.9  --   --   --   --   GFRNONAA 43*   < > 40* 41* 45*  ANIONGAP 10   < > 9 9 10    < > = values in this interval not displayed.     Hematology Recent Labs  Lab 04/05/21 0209 04/06/21 0404 04/08/21 0535  WBC 6.4 6.1 5.7  RBC 4.60 4.77 4.70  HGB 11.5* 11.7* 11.6*  HCT 36.7* 37.1* 35.5*  MCV 79.8* 77.8* 75.5*  MCH 25.0* 24.5* 24.7*  MCHC 31.3 31.5 32.7  RDW 20.0* 19.6* 19.1*  PLT 122* 129* 140*    Cardiac EnzymesNo results for input(s): TROPONINI in the last 168 hours. No results for input(s): TROPIPOC in the last 168 hours.   BNP Recent Labs  Lab 04/04/21 1251  BNP >4,500.0*     DDimer No results for input(s): DDIMER in the last 168 hours.   Radiology    No results found.  Cardiac Studies   TTE 04/05/21 IMPRESSIONS    1. Left ventricular ejection fraction, by estimation, is 20 to 25%. The  left ventricle has severely decreased function. The left ventricle  demonstrates global hypokinesis. The left ventricular internal cavity size  was moderately to severely dilated.  There is moderate left ventricular hypertrophy. Left ventricular diastolic  parameters are indeterminate.   2. Right ventricular systolic function is severely reduced. The right  ventricular size is severely enlarged. There is moderately elevated  pulmonary artery systolic pressure.   3. Left atrial size was severely dilated.   4. Right atrial size was severely dilated.   5. The mitral valve is grossly normal. Severe mitral valve regurgitation.   6. Tricuspid valve regurgitation is severe.   7. The aortic valve is grossly normal. Aortic valve regurgitation is  trivial. No aortic stenosis is present.    Patient Profile     81 y.o. male with past medical history of hypertension, CAD, HFrEF, hyperlipidemia who presents to the hospital for dizziness and worsening LE edema, found to be in heart failure exacerbation 2/2 medications nonadherence.   Assessment & Plan    Acute on chronic HFrEF Hypertensive cardiomyopathy CAD In the setting of medication nonadherence.  Repeat echocardiogram showed persistent severe biventricular dysfunction, which is worse than his echo in March 2022. Good urine output overnight. Weight continue to trend down. He was transitioned to Lasix PO 20 mg BID yesterday which we will continue after discharge. Blood pressure was low yesterday 2/2 overdiuresis. Given low BP, will change to Toprol 37.5mg  -Lasix 20 mg PO BID -Start low dose Entresto, cont farxiga and Spiro (change to bedtime dosing).  -Continue aspirin -Repeat Echo today to assess his MR   AKI  Presumed cardiorenal in the setting of volume overload. Creatine improving  -Continue diuresis   Hyperlipidemia LDL 44 in March -Continue Lipitor 10 mg   IDA -s/p Feraheme    For questions or updates, please contact [facility] Please consult [email or web address] for contact info under Cardiology/STEMI.      Signed, Doran Stabler, DO  04/08/2021, 8:00 AM     ATTENDING ATTESTATION:  After conducting a review of all available clinical information with the care team, interviewing the patient, and performing a physical exam, I agree with the findings and plan described in this note.   GEN: No acute distress.   Cardiac: RRR, 3/6 holosystolic murmur  Respiratory: Clear to auscultation bilaterally. GI: Soft, nontender, non-distended  MS: No edema; No deformity. Neuro:  Nonfocal  Vasc:  +2 radial pulses  Much improved.  Had low BP yesterday but was asymptomatic.  Change coreg to Toprol XL 37.5mg  bedtime, change spironolactone to bedtime, start low dose entresto, cont farxiga, cont bidil, cont lasix 20mg  po  bid.  Can be discharged today with BMP and cardiology follow up next week.  , MD Pager [phone]

## 2021-04-08 NOTE — Care Management Important Message (Signed)
Important Message  Patient Details  Name: Matthew [name] MRN: [id number] Date of Birth: [birth year]   Medicare Important Message Given:  Yes     Hannah Beat 04/08/2021, 2:33 PM

## 2021-04-08 NOTE — Progress Notes (Signed)
Heart Failure Stewardship Pharmacist Progress Note   PCP: Pcp, No PCP-Cardiologist: Lance Muss, MD    HPI:  81 yo M with PMH of HTN, CAD, HFrEF, and HLD. He presented to the ED on 2/20 with dizziness, weight gain, dyspnea on exertion, and LE edema. CXR with mild pulmonary edema. An ECHO was done on 2/20 and LVEF is 20-25% with severely reduced RV systolic function. Last ECHO from 04/2020 with LVEF 25-30% and mildly reduced RV. Notable for medication noncompliance.   Current HF Medications: Diuretic: furosemide 20 mg PO BID Beta Blocker: carvedilol 25 mg BID ACE/ARB/ARNI: Entresto 24/26 mg BID Aldosterone Antagonist: spironolactone 25 mg daily SGLT2i: Farxiga 10 mg daily  Prior to admission HF Medications: Diuretic: furosemide 20 mg every M/F Beta blocker: carvedilol 25 mg BID ACE/ARB/ARNI: Entresto 97/103 mg BID Aldosterone Antagonist: spironolactone 25 mg daily SGLT2i: Farxiga 10 mg daily  Pertinent Lab Values: Serum creatinine 1.55, BUN 27, Potassium 4.1, Sodium 136, BNP >4500, Magnesium 2.0 Ferritin 110; TSAT 7  Vital Signs: Weight: 228 lbs (admission weight: 252 lbs) Blood pressure: 110/70s  Heart rate: 70-80s  I/O: -5.4L yesterday; net -7.7L  Medication Assistance / Insurance Benefits Check: Does the patient have prescription insurance?  Yes Type of insurance plan: [employer]  Outpatient Pharmacy:  Prior to admission outpatient pharmacy: [facility] Is the patient willing to use [facility] TOC pharmacy at discharge? Yes Is the patient willing to transition their outpatient pharmacy to utilize a [facility] outpatient pharmacy?   Pending    Assessment: 1. Acute on chronic systolic CHF (EF 03-47%), due to presumed NICM - no R/LHC completed. NYHA class IV symptoms. - Continue furosemide 20 mg PO BID - Continue carvedilol 25 mg BID  - Agree with starting Entresto 24/26 mg BID - Continue spironolactone 25 mg daily - Continue Farxiga 10 mg daily - Off BiDil with  hypotension  - Feraheme 510 mg IV x 1 given on 2/23   Plan: 1) Medication changes recommended at this time: - Agree with changes as above  2) Patient assistance: - Investigate reason for medication noncompliance  - BiDil copay $64 (if generic available, copay is only $10)  3)  Education  - To be completed prior to discharge  Sharen Hones, PharmD, BCPS Heart Failure Engineer, building services Phone [phone]

## 2021-04-08 NOTE — TOC Transition Note (Addendum)
Transition of Care [facility]) - CM/SW Discharge Note   Patient Details  Name: Matthew [name] MRN: [id number] Date of Birth: 02-18-1940  Transition [facility]) CM/SW Contact:  Leone Haven, RN Phone Number: 04/08/2021, 10:26 AM   Clinical Narrative:    NCM spoke with daughter who  is  the HPOA, offered choice form Medicare.gov list, she does not have a preference, she is ok with [facility] as long as it is good with his insurance.  NCM made referral to [facility] with [facility].  She states she can take referral for HHRN, HHPT, HHOT.  Soc will begin 24 to 48 hrs post dc. Per daughter she will transport patient home at discharge or her brother [name]. He has no PCP, NCM scheduled a follow up with Dr. Kathaleen Bury for 2/28 at 3:30 on [facility]  for PCP. Daughter states she gets off work at 5 pm ,she will ask if she can get off a little earlier to come pick him up to go home.     Final next level of care: Home w Home Health Services Barriers to Discharge: Continued Medical Work up   Patient Goals and CMS Choice Patient states their goals for this hospitalization and ongoing recovery are:: return home CMS Medicare.gov Compare Post Acute Care list provided to:: Patient Represenative (must comment) Choice offered to / list presented to : Adult Children  Discharge Placement                       Discharge Plan and Services   Discharge Planning Services: CM Consult Post Acute Care Choice: Home Health            DME Agency: NA       HH Arranged: PT, OT,RN HH Agency: [facility] Date HH Agency Contacted: 04/08/21 Time HH Agency Contacted: 1024 Representative spoke with at [employer] Agency: Trena Platt  Social Determinants of Health (SDOH) Interventions Food Insecurity Interventions: Intervention Not Indicated Financial Strain Interventions: Intervention Not Indicated Housing Interventions: Intervention Not Indicated Transportation Interventions: Intervention  Not Indicated   Readmission Risk Interventions No flowsheet data found.

## 2021-04-08 NOTE — Discharge Summary (Signed)
Family Medicine Teaching [facility] Discharge Summary  Patient name: Matthew [name] Medical record number: [id number] Date of birth: 10/18/1940 Age: 81 y.o. Gender: male Date of Admission: 04/04/2021  Date of Discharge: 04/08/2021 Admitting Physician: Carney Living, MD  Primary Care Provider: Pcp, No Consultants: Cardiology  Indication for Hospitalization: LE Edema, Shortness of Breath  Discharge Diagnoses/Problem List:  Principal Problem:   Acute exacerbation of CHF (congestive heart failure) (HCC) Active Problems:   Obesity   Coronary atherosclerosis of native coronary artery   Essential hypertension, benign   Congestive dilated cardiomyopathy (HCC)   Normocytic anemia   Disposition: Home with Home Health  Discharge Condition: Stable, improved   Discharge Exam:  General: Awake, alert, eating breakfast Cardio: Irregular rhythm, holosystolic murmur Pulm: Normal WOB on RA, lungs CTAB Abd: Soft, non-tender, non-distended Ext: 1+ pedal edema, compression stockings in place to bilateral extremities  Brief Hospital Course:  Brief Hospital Course  Matthew [name] is an 81 yo male who presented to the [facility] on 2/20 with two to three weeks of progressive lower extremity swelling and dyspnea on exertion in the setting of non-adherence to his heart failure medications.   HFrEF Exacerbation On arrival, patient was noted to be clinically hypervolemic. He reported not taking any of his heart-failure medications for 1-2 weeks leading up to hospitalization. BNP on admission was >4,500 and CXR showed cardiomegaly with mild pulmonary edema. He was mildly tachypneic but remained stable on room air. He was started on IV Lasix in the Emergency Department. An echocardiogram was obtained which showed LVEF 20 to 25%, severe dilatation of all four chambers, severe mitral and tricuspid regurgitation. Cardiology was consulted and recommended a lasix drip with metolazone augmentation. He  diuresed a total of 7.8L of fluid with rapid improvement in his symptoms. Cardiology adjusted his HF meds and he was discharged on Lasix  BID, Coreg  BID, Entresto 24/26mg  BID, Spironolactone  daily, and farxiga  daily. He will have close follow-up with their office as well.   AKI Patient's Creatinine on arrival was 1.6, up from his recent baseline of 1.1. This was presumed to be cardiorenal in etiology given his acute HF exacerbation. His creatinine remained stable throughout the hospitalization and he tolerated aggressive diuresis well.   Microcytic Anemia Patient's hemoglobin on admission was 11.6. MCV was 78.7. Iron studies showed an Iron of 22, TIBC 308, low iron sats at 7% and a ferritin of 110. He received a 1x dose of IV Feraheme in the hospital.    Items for PCP Follow-Up 1) Recommend repeat CBC, consider oral iron supplementation at followup  2) Recheck BMP within 1 week of discharge to monitor Cr and ensure electrolytes remain within normal limits 3) We are making a number of medication changes on discharge. We are also attempting to arrange home health services to help with med management in the post-discharge period. Please discuss medication adherence with him at follow-up   Significant Procedures: None  Significant Labs and Imaging:  Recent Labs  Lab 04/05/21 0209 04/06/21 0404 04/08/21 0535  WBC 6.4 6.1 5.7  HGB 11.5* 11.7* 11.6*  HCT 36.7* 37.1* 35.5*  PLT 122* 129* 140*   Recent Labs  Lab 04/04/21 1251 04/04/21 1736 04/05/21 0209 04/05/21 1858 04/06/21 0404 04/06/21 1443 04/07/21 0414 04/08/21 0535  NA 136  --    < > 137 137 137 136 135  K 3.8  --    < > 4.5 4.2 4.5 4.1 4.1  CL 107  --    < >  108 104 107 103 100  CO2 19*  --    < > 18* 20* 21* 24 25  GLUCOSE 88  --    < > 111* 111* 104* 94 91  BUN 23  --    < > 27* 26* 28* 27* 27*  CREATININE 1.60*  --    < > 1.73* 1.71* 1.72* 1.69* 1.55*  CALCIUM 8.7*  --    < > 8.6* 8.9 8.8* 8.8* 8.7*   MG  --  2.0  --   --  1.9  --  1.9 2.0  ALKPHOS 95  --   --   --   --   --   --   --   AST 20  --   --   --   --   --   --   --   ALT 15  --   --   --   --   --   --   --   ALBUMIN 3.0*  --   --   --   --   --   --   --    < > = values in this interval not displayed.    ECHOCARDIOGRAM COMPLETE    ECHOCARDIOGRAM REPORT       Patient Name:   Matthew [name] Date of Exam: 04/04/2021 Medical Rec #:  [id number]        Height:       78.0 in Accession #:    [id number]       Weight:       231.0 lb Date of Birth:  January 23, 1941        BSA:          2.400 m Patient Age:    80 years         BP:           132/111 mmHg Patient Gender: M                HR:           83 bpm. Exam Location:  Inpatient  Procedure: 2D Echo  Indications:    congestive heart failure   History:        Patient has prior history of Echocardiogram examinations, most                 recent 05/10/2020. Cardiomyopathy and CHF, Abnormal ECG; Risk                 Factors:Hypertension and Dyslipidemia.   Sonographer:    [name] RDCS Referring Phys: [id number] MARSHALL L CHAMBLISS  IMPRESSIONS   1. Left ventricular ejection fraction, by estimation, is 20 to 25%. The left ventricle has severely decreased function. The left ventricle demonstrates global hypokinesis. The left ventricular internal cavity size was moderately to severely dilated.  There is moderate left ventricular hypertrophy. Left ventricular diastolic parameters are indeterminate.  2. Right ventricular systolic function is severely reduced. The right ventricular size is severely enlarged. There is moderately elevated pulmonary artery systolic pressure.  3. Left atrial size was severely dilated.  4. Right atrial size was severely dilated.  5. The mitral valve is grossly normal. Severe mitral valve regurgitation.  6. Tricuspid valve regurgitation is severe.  7. The aortic valve is grossly normal. Aortic valve regurgitation is trivial. No aortic stenosis is  present.  FINDINGS  Left Ventricle: Left ventricular ejection fraction, by estimation, is 20 to 25%. The left ventricle has severely  decreased function. The left ventricle demonstrates global hypokinesis. The left ventricular internal cavity size was moderately to severely  dilated. There is moderate left ventricular hypertrophy. Left ventricular diastolic parameters are indeterminate.  Right Ventricle: The right ventricular size is severely enlarged. Right vetricular wall thickness was not well visualized. Right ventricular systolic function is severely reduced. There is moderately elevated pulmonary artery systolic pressure. The  tricuspid regurgitant velocity is 2.96 m/s, and with an assumed right atrial pressure of 15 mmHg, the estimated right ventricular systolic pressure is 50.0 mmHg.  Left Atrium: Left atrial size was severely dilated.  Right Atrium: Right atrial size was severely dilated.  Pericardium: There is no evidence of pericardial effusion.  Mitral Valve: The mitral valve is grossly normal. Severe mitral valve regurgitation.  Tricuspid Valve: The tricuspid valve is grossly normal. Tricuspid valve regurgitation is severe.  Aortic Valve: The aortic valve is grossly normal. Aortic valve regurgitation is trivial. No aortic stenosis is present.  Pulmonic Valve: The pulmonic valve was grossly normal. Pulmonic valve regurgitation is mild.  Aorta: The aortic root and ascending aorta are structurally normal, with no evidence of dilitation.  IAS/Shunts: The atrial septum is grossly normal.    LEFT VENTRICLE PLAX 2D LVIDd:         7.10 cm LVIDs:         6.10 cm LV PW:         1.30 cm LV IVS:        1.00 cm LVOT diam:     2.10 cm LVOT Area:     3.46 cm    IVC IVC diam: 3.20 cm  LEFT ATRIUM              Index        RIGHT ATRIUM           Index LA diam:        5.50 cm  2.29 cm/m   RA Area:     32.10 cm LA Vol (A2C):   150.0 ml 62.50 ml/m  RA Volume:   121.00 ml 50.41  ml/m LA Vol (A4C):   143.0 ml 59.58 ml/m LA Biplane Vol: 154.0 ml 64.16 ml/m    AORTA Ao Root diam: 3.90 cm Ao Asc diam:  3.80 cm  TRICUSPID VALVE TR Peak grad:   35.0 mmHg TR Vmax:        296.00 cm/s   SHUNTS Systemic Diam: 2.10 cm  Kristeen Miss MD Electronically signed by Kristeen Miss MD Signature Date/Time: 04/04/2021/5:16:15 PM      Final   CT Head Wo Contrast CLINICAL DATA:  Dizziness.  EXAM: CT HEAD WITHOUT CONTRAST  TECHNIQUE: Contiguous axial images were obtained from the base of the skull through the vertex without intravenous contrast.  RADIATION DOSE REDUCTION: This exam was performed according to the departmental dose-optimization program which includes automated exposure control, adjustment of the mA and/or kV according to patient size and/or use of iterative reconstruction technique.  COMPARISON:  None.  FINDINGS: Brain: There is no evidence of an acute infarct, intracranial hemorrhage, mass, midline shift, or extra-axial fluid collection. Mild cerebral atrophy is within normal limits for age. Hypodensities in the cerebral white matter are nonspecific but compatible with minimal chronic small vessel ischemic disease. There are 2 punctate calcifications in the pons.  Vascular: Calcified atherosclerosis at the skull base. No hyperdense vessel.  Skull: No acute fracture or suspicious osseous lesion.  Sinuses/Orbits: Complete opacification of the left frontal sinus with evidence of chronic sinusitis.  Partial left anterior ethmoid air cell opacification. Mucosal thickening and fluid in the left maxillary sinus. Clear mastoid air cells. Unremarkable orbits.  Other: None.  IMPRESSION: 1. No evidence of acute intracranial abnormality. 2. Chronic left frontal sinusitis. Left maxillary sinus fluid, correlate for acute sinusitis.  Electronically Signed   By: Sebastian Ache M.D.   On: 04/04/2021 12:15 DG Chest Port 1 View CLINICAL DATA:   Shortness of breath  EXAM: PORTABLE CHEST 1 VIEW  COMPARISON:  Radiograph 01/11/2017  FINDINGS: Unchanged, enlarged cardiac silhouette. Central pulmonary vascular congestion. Mild edema. There is a moderate size layering right pleural effusion with adjacent basilar opacities. Skin fold overlies the left upper chest. No visible pneumothorax. Bilateral shoulder osteoarthritis with findings of probable rotator cuff disease.  IMPRESSION: Cardiomegaly with mild pulmonary edema. Moderate layering right pleural effusion with adjacent basilar opacities, likely atelectasis.  Electronically Signed   By: Caprice Renshaw M.D.   On: 04/04/2021 11:18    Results/Tests Pending at Time of Discharge: Repeat echocardiogram  Discharge Medications:  Allergies as of 04/08/2021       Reactions   Lactose Intolerance (gi)         Medication List     STOP taking these medications    carvedilol 25 MG tablet Commonly known as: COREG   Entresto 97-103 MG Generic drug: sacubitril-valsartan Replaced by: Sherryll Burger 24-26 MG   sildenafil 100 MG tablet Commonly known as: Viagra       TAKE these medications    acetaminophen 500 MG tablet Commonly known as: TYLENOL Take 500 mg by mouth every 6 (six) hours as needed for moderate pain.   aspirin EC 81 MG tablet Take 1 tablet (81 mg total) by mouth daily.   atorvastatin 10 MG tablet Commonly known as: LIPITOR TAKE 1 TABLET(10 MG) BY MOUTH DAILY   dapagliflozin propanediol 10 MG Tabs tablet Commonly known as: Farxiga Take 1 tablet (10 mg total) by mouth daily.   Entresto 24-26 MG Generic drug: sacubitril-valsartan Take 1 tablet by mouth 2 (two) times daily. Replaces: Entresto 97-103 MG   furosemide 20 MG tablet Commonly known as: LASIX Take 1 tablet (20 mg total) by mouth 2 (two) times daily. What changed:  medication strength when to take this additional instructions   metoprolol succinate 25 MG 24 hr tablet Commonly known as:  TOPROL-XL Take 1.5 tablets (37.5 mg total) by mouth at bedtime.   nystatin cream Commonly known as: MYCOSTATIN Apply topically 2 (two) times daily for 4 days.   Prostate Health Caps Take 1 capsule by mouth in the morning and at bedtime.   spironolactone 25 MG tablet Commonly known as: ALDACTONE Take 1 tablet (25 mg total) by mouth at bedtime. Start taking on: April 09, 2021 What changed: when to take this        Discharge Instructions: Please refer to Patient Instructions section of EMR for full details.  Patient was counseled important signs and symptoms that should prompt return to medical care, changes in medications, dietary instructions, activity restrictions, and follow up appointments.   Follow-Up Appointments:  Follow-up Information     [facility]. Go to.   Specialty: Cardiology Why: Monday, March 6 @ 11AM for [facility] clinic within [facility].  Bring all medications withyou. FREE valet parking at [employer], off [name]. Contact information: [address] [id number] mc [place] [address] [phone]        Jobe Gibbon, [facility] Follow up.   Specialty: Home Health Services Why: HHRN,HHPT,HHOT- Agency will contact with apt times. Contact information: [address] [place] [address] [phone]         Corky Crafts, MD. Schedule an appointment as soon as possible for a visit.   Specialties: Cardiology, Radiology, Interventional Cardiology Contact information: [address] [place] [address] [phone]         [facility] .   Specialty: Internal Medicine Contact information: [address] [place] [address] [phone]        Jackie Plum, MD Follow up on 04/12/2021.   Specialty: Internal Medicine Why: 3:30 for new patient apt, for PCP. Contact  information: [address] [place] [address] [phone]                 Alicia Amel, MD 04/08/2021, 2:10 PM PGY-1, [facility]

## 2021-04-08 NOTE — Progress Notes (Signed)
PT Cancellation Note  Patient Details Name: Matthew [name] MRN: [id number] DOB: 1940/05/18   Cancelled Treatment:    Reason Eval/Treat Not Completed: Patient declined, no reason specified. PT reports he has been up and moving, declines PT intervention at this time.   Arlyss Gandy 04/08/2021, 4:38 PM

## 2021-04-08 NOTE — Discharge Instructions (Addendum)
Dear [name],   Thank you so much for allowing [place] to be part of your care!  You were admitted to [facility] for heart failure exacerbation. You were treated with medications to reduce fluid in your body. The cardiologist has made several changes to your medications, please review these changes carefully. We will have a home health nurse come out and make sure that you are taking these as instructed.   POST-HOSPITAL & CARE INSTRUCTIONS Be sure to follow-up with the cardiologist as scheduled and take all your medications. Please let PCP/Specialists know of any changes that were made.  Please see medications section of this packet for any medication changes.   DOCTOR'S APPOINTMENT & FOLLOW UP CARE INSTRUCTIONS  Future Appointments  Date Time Provider Department Center  04/18/2021 11:00 AM [facility] None  07/26/2021  8:20 AM Corky Crafts, MD [facility]    RETURN PRECAUTIONS: Please call your doctor or go to the ED if you develop shortness of breath, significant leg swelling, or chest pain.  Take care and be well!  Family Medicine Teaching Service  [place]  [facility]  [address] [place], [place] [address] [phone]

## 2021-04-08 NOTE — Progress Notes (Signed)
Discharge instructions given. Patient verbalized understanding and all questions were answered.  ?

## 2021-04-13 DIAGNOSIS — G9340 Encephalopathy, unspecified: Secondary | ICD-10-CM

## 2021-04-13 HISTORY — DX: Encephalopathy, unspecified: G93.40

## 2021-04-15 NOTE — Progress Notes (Incomplete)
? ? ?HEART & VASCULAR TRANSITION OF CARE CONSULT NOTE  ? ? ? ?Referring Physician: ?Primary Care: ?Primary Cardiologist: ? ?HPI: ?Referred to clinic by *** for heart failure consultation. Has history of NICM/chronic systolic CHF, CAD, HTN, obesity. Has longstanding history of cardiomyopathy. EF previously improved to 50-55% in 2014.  ? ?Last saw [name] in 03/22. Echo repeat, EF down to 25-30%, RV mildly reduced, mild MR. He was referred to PharmD for titration of GDMT, last visit July 2022. ? ?Admitted 02/23 with a/c CHF. Had not been taking his CHF medications or diuretics several days prior to admission. ? ?Cardiac Testing  ? ? ?Review of Systems: [y] = yes, [ ]  = no  ? ?General: Weight gain [ ] ; Weight loss [ ] ; Anorexia [ ] ; Fatigue [ ] ; Fever [ ] ; Chills [ ] ; Weakness [ ]   ?Cardiac: Chest pain/pressure [ ] ; Resting SOB [ ] ; Exertional SOB [ ] ; Orthopnea [ ] ; Pedal Edema [ ] ; Palpitations [ ] ; Syncope [ ] ; Presyncope [ ] ; Paroxysmal nocturnal dyspnea[ ]   ?Pulmonary: Cough [ ] ; Wheezing[ ] ; Hemoptysis[ ] ; Sputum [ ] ; Snoring [ ]   ?GI: Vomiting[ ] ; Dysphagia[ ] ; Melena[ ] ; Hematochezia [ ] ; Heartburn[ ] ; Abdominal pain [ ] ; Constipation [ ] ; Diarrhea [ ] ; BRBPR [ ]   ?GU: Hematuria[ ] ; Dysuria [ ] ; Nocturia[ ]   ?Vascular: Pain in legs with walking [ ] ; Pain in feet with lying flat [ ] ; Non-healing sores [ ] ; Stroke [ ] ; TIA [ ] ; Slurred speech [ ] ;  ?Neuro: Headaches[ ] ; Vertigo[ ] ; Seizures[ ] ; Paresthesias[ ] ;Blurred vision [ ] ; Diplopia [ ] ; Vision changes [ ]   ?Ortho/Skin: Arthritis [ ] ; Joint pain [ ] ; Muscle pain [ ] ; Joint swelling [ ] ; Back Pain [ ] ; Rash [ ]   ?Psych: Depression[ ] ; Anxiety[ ]   ?Heme: Bleeding problems [ ] ; Clotting disorders [ ] ; Anemia [ ]   ?Endocrine: Diabetes [ ] ; Thyroid dysfunction[ ]  ? ? ?Past Medical History:  ?Diagnosis Date  ? CAD (coronary artery disease)   ? Cardiomyopathy ([place])   ? Heart failure ([place])   ? HTN (hypertension)   ? Obesity   ? Osteoarthritis   ? ? ?Current  Outpatient Medications  ?Medication Sig Dispense Refill  ? acetaminophen (TYLENOL) 500 MG tablet Take 500 mg by mouth every 6 (six) hours as needed for moderate pain.    ? aspirin EC 81 MG tablet Take 1 tablet (81 mg total) by mouth daily. 90 tablet 3  ? atorvastatin (LIPITOR) 10 MG tablet TAKE 1 TABLET(10 MG) BY MOUTH DAILY 90 tablet 3  ? dapagliflozin propanediol (FARXIGA) 10 MG TABS tablet Take 1 tablet (10 mg total) by mouth daily. 90 tablet 3  ? furosemide (LASIX) 20 MG tablet Take 1 tablet (20 mg total) by mouth 2 (two) times daily. 60 tablet 0  ? metoprolol succinate (TOPROL-XL) 25 MG 24 hr tablet Take 1.5 tablets (37.5 mg total) by mouth at bedtime. 45 tablet 0  ? Misc Natural Products (PROSTATE HEALTH) CAPS Take 1 capsule by mouth in the morning and at bedtime.    ? sacubitril-valsartan (ENTRESTO) 24-26 MG Take 1 tablet by mouth 2 (two) times daily. 60 tablet 0  ? spironolactone (ALDACTONE) 25 MG tablet Take 1 tablet (25 mg total) by mouth at bedtime. 30 tablet 0  ? ?No current facility-administered medications for this visit.  ? ? ?Allergies  ?Allergen Reactions  ? Lactose Intolerance (Gi)   ? ? ?  ?Social History  ? ?Socioeconomic History  ?  Marital status: Unknown  ?  Spouse name: Not on file  ? Number of children: Not on file  ? Years of education: Not on file  ? Highest education level: 11th grade  ?Occupational History  ? Occupation: retired  ?Tobacco Use  ? Smoking status: Never  ? Smokeless tobacco: Never  ?Vaping Use  ? Vaping Use: Never used  ?Substance and Sexual Activity  ? Alcohol use: No  ? Drug use: No  ? Sexual activity: Not on file  ?Other Topics Concern  ? Not on file  ?Social History Narrative  ? Not on file  ? ?Social Determinants of Health  ? ?Financial Resource Strain: Low Risk   ? Difficulty of Paying Living Expenses: Not very hard  ?Food Insecurity: No Food Insecurity  ? Worried About Charity fundraiser in the Last Year: Never true  ? Ran Out of Food in the Last Year: Never true   ?Transportation Needs: No Transportation Needs  ? Lack of Transportation (Medical): No  ? Lack of Transportation (Non-Medical): No  ?Physical Activity: Not on file  ?Stress: Not on file  ?Social Connections: Not on file  ?Intimate Partner Violence: Not on file  ? ? ?  ?Family History  ?Problem Relation Age of Onset  ? Hypertension Father   ? Diabetes Brother   ? Heart attack Neg Hx   ? Stroke Neg Hx   ? ? ?There were no vitals filed for this visit. ? ?PHYSICAL EXAM: ?General:  Well appearing. No respiratory difficulty ?HEENT: normal ?Neck: supple. no JVD. Carotids 2+ bilat; no bruits. No lymphadenopathy or thryomegaly appreciated. ?Cor: PMI nondisplaced. Regular rate & rhythm. No rubs, gallops or murmurs. ?Lungs: clear ?Abdomen: soft, nontender, nondistended. No hepatosplenomegaly. No bruits or masses. Good bowel sounds. ?Extremities: no cyanosis, clubbing, rash, edema ?Neuro: alert & oriented x 3, cranial nerves grossly intact. moves all 4 extremities w/o difficulty. Affect pleasant. ? ?ECG: ? ? ?ASSESSMENT & PLAN: ? ?NYHA *** ?GDMT  ?Diuretic- ?BB- ?Ace/ARB/ARNI ?MRA ?SGLT2i ? ? ? ?Referred to HFSW (PCP, Medications, Transportation, ETOH Abuse, Drug Abuse, Insurance, Financial ): Yes or No ?Refer to Pharmacy: Yes or No ?Refer to Home Health: Yes on No ?Refer to Advanced Heart Failure Clinic: Yes or no  ?Refer to General Cardiology: Yes or No ? ?Follow up   ?

## 2021-04-18 ENCOUNTER — Encounter (HOSPITAL_COMMUNITY): Payer: Medicare PPO

## 2021-04-18 ENCOUNTER — Telehealth (HOSPITAL_COMMUNITY): Payer: Self-pay

## 2021-04-18 NOTE — Telephone Encounter (Signed)
Heart Failure Nurse Navigator Progress Note ? ?Contacted patient regarding HV TOC appt. Pt states he cannot come as he work up having stomach issues, starting to have diarrhea. Will attempt to reschedule as patient allows.  ? ?Ozella Rocks, MSN, RN ?Heart Failure Nurse Navigator ?[phone] ? ?

## 2021-04-18 NOTE — Telephone Encounter (Signed)
Update: Spoke with daughter [name]) per pt request regarding rescheduling HV TOC appt. Agreeable to Wednesday, March 15 @ 2pm. Confirmed time and directions prior to ending call. ? ?Ozella Rocks, MSN, RN ?Heart Failure Nurse Navigator ?[phone] ? ?

## 2021-04-27 ENCOUNTER — Encounter (HOSPITAL_COMMUNITY): Payer: Medicare PPO

## 2021-04-27 NOTE — Progress Notes (Incomplete)
? ? ?HEART & VASCULAR TRANSITION OF CARE CONSULT NOTE  ? ? ? ?Referring Physician: ?Primary Care: ?Primary Cardiologist: ? ?HPI: ?Referred to clinic by *** for heart failure consultation.  ? ?Has history of NICM/chronic systolic CHF, CAD, HTN, obesity. Has longstanding history of cardiomyopathy. EF previously improved to 50-55% in 2014.  ? ?Last saw [name] in 03/22. Echo repeat, EF down to 25-30%, RV mildly reduced, mild MR. He was referred to PharmD for titration of GDMT, last visit July 2022. ? ?Admitted 02/23 with a/c CHF. Had not been taking his CHF medications or diuretics several days prior to admission.BNP on admission was >4,500 and CXR showed cardiomegaly with mild pulmonary edema. 2D Echo showed severe dilated biventricular heart failure, LVEF 20 to 25%, RV severely enlarged and severely reduced severe mitral and tricuspid regurgitation and severe BAE. Severely elevated RVSP, 50 mmHg.  ? ? ?Cardiology was consulted and recommended a lasix drip with metolazone augmentation. He diuresed a total of 7.8L of fluid with rapid improvement in his symptoms. Cardiology adjusted his HF meds and he was discharged on Lasix 20mg  BID, Coreg 25mg  BID, Entresto 24/26mg  BID, Spironolactone 25mg  daily, and farxiga 10mg  daily. ? ? ? ? ?Cardiac Testing  ? ?2D Echo 2/23 ?Left ventricular ejection fraction, by estimation, is 20 to 25%. The left ventricle has ?severely decreased function. The left ventricle demonstrates global hypokinesis. The ?left ventricular internal cavity size was moderately to severely dilated. There is ?moderate left ventricular hypertrophy. Left ventricular diastolic parameters are ?indeterminate. ?1. ?Right ventricular systolic function is severely reduced. The right ventricular size is ?severely enlarged. There is moderately elevated pulmonary artery systolic pressure. ?2. ?3. Left atrial size was severely dilated. ?4. Right atrial size was severely dilated. ?5. The mitral valve is grossly  normal. Severe mitral valve regurgitation. ?6. Tricuspid valve regurgitation is severe. ?The aortic valve is grossly normal. Aortic valve regurgitation is trivial. No aortic ?stenosis is present. ? ? ?Review of Systems: [y] = yes, [ ]  = no  ? ?General: Weight gain [ ] ; Weight loss [ ] ; Anorexia [ ] ; Fatigue [ ] ; Fever [ ] ; Chills [ ] ; Weakness [ ]   ?Cardiac: Chest pain/pressure [ ] ; Resting SOB [ ] ; Exertional SOB [ ] ; Orthopnea [ ] ; Pedal Edema [ ] ; Palpitations [ ] ; Syncope [ ] ; Presyncope [ ] ; Paroxysmal nocturnal dyspnea[ ]   ?Pulmonary: Cough [ ] ; Wheezing[ ] ; Hemoptysis[ ] ; Sputum [ ] ; Snoring [ ]   ?GI: Vomiting[ ] ; Dysphagia[ ] ; Melena[ ] ; Hematochezia [ ] ; Heartburn[ ] ; Abdominal pain [ ] ; Constipation [ ] ; Diarrhea [ ] ; BRBPR [ ]   ?GU: Hematuria[ ] ; Dysuria [ ] ; Nocturia[ ]   ?Vascular: Pain in legs with walking [ ] ; Pain in feet with lying flat [ ] ; Non-healing sores [ ] ; Stroke [ ] ; TIA [ ] ; Slurred speech [ ] ;  ?Neuro: Headaches[ ] ; Vertigo[ ] ; Seizures[ ] ; Paresthesias[ ] ;Blurred vision [ ] ; Diplopia [ ] ; Vision changes [ ]   ?Ortho/Skin: Arthritis [ ] ; Joint pain [ ] ; Muscle pain [ ] ; Joint swelling [ ] ; Back Pain [ ] ; Rash [ ]   ?Psych: Depression[ ] ; Anxiety[ ]   ?Heme: Bleeding problems [ ] ; Clotting disorders [ ] ; Anemia [ ]   ?Endocrine: Diabetes [ ] ; Thyroid dysfunction[ ]  ? ? ?Past Medical History:  ?Diagnosis Date  ? CAD (coronary artery disease)   ? Cardiomyopathy ([place])   ? Heart failure ([place])   ? HTN (hypertension)   ? Obesity   ? Osteoarthritis   ? ? ?Current Outpatient  Medications  ?Medication Sig Dispense Refill  ? acetaminophen (TYLENOL) 500 MG tablet Take 500 mg by mouth every 6 (six) hours as needed for moderate pain.    ? aspirin EC 81 MG tablet Take 1 tablet (81 mg total) by mouth daily. 90 tablet 3  ? atorvastatin (LIPITOR) 10 MG tablet TAKE 1 TABLET(10 MG) BY MOUTH DAILY 90 tablet 3  ? dapagliflozin propanediol (FARXIGA) 10 MG TABS tablet Take 1 tablet (10 mg total) by mouth daily. 90  tablet 3  ? furosemide (LASIX) 20 MG tablet Take 1 tablet (20 mg total) by mouth 2 (two) times daily. 60 tablet 0  ? metoprolol succinate (TOPROL-XL) 25 MG 24 hr tablet Take 1.5 tablets (37.5 mg total) by mouth at bedtime. 45 tablet 0  ? Misc Natural Products (PROSTATE HEALTH) CAPS Take 1 capsule by mouth in the morning and at bedtime.    ? sacubitril-valsartan (ENTRESTO) 24-26 MG Take 1 tablet by mouth 2 (two) times daily. 60 tablet 0  ? spironolactone (ALDACTONE) 25 MG tablet Take 1 tablet (25 mg total) by mouth at bedtime. 30 tablet 0  ? ?No current facility-administered medications for this visit.  ? ? ?Allergies  ?Allergen Reactions  ? Lactose Intolerance (Gi)   ? ? ?  ?Social History  ? ?Socioeconomic History  ? Marital status: Unknown  ?  Spouse name: Not on file  ? Number of children: Not on file  ? Years of education: Not on file  ? Highest education level: 11th grade  ?Occupational History  ? Occupation: retired  ?Tobacco Use  ? Smoking status: Never  ? Smokeless tobacco: Never  ?Vaping Use  ? Vaping Use: Never used  ?Substance and Sexual Activity  ? Alcohol use: No  ? Drug use: No  ? Sexual activity: Not on file  ?Other Topics Concern  ? Not on file  ?Social History Narrative  ? Not on file  ? ?Social Determinants of Health  ? ?Financial Resource Strain: Low Risk   ? Difficulty of Paying Living Expenses: Not very hard  ?Food Insecurity: No Food Insecurity  ? Worried About Charity fundraiser in the Last Year: Never true  ? Ran Out of Food in the Last Year: Never true  ?Transportation Needs: No Transportation Needs  ? Lack of Transportation (Medical): No  ? Lack of Transportation (Non-Medical): No  ?Physical Activity: Not on file  ?Stress: Not on file  ?Social Connections: Not on file  ?Intimate Partner Violence: Not on file  ? ? ?  ?Family History  ?Problem Relation Age of Onset  ? Hypertension Father   ? Diabetes Brother   ? Heart attack Neg Hx   ? Stroke Neg Hx   ? ? ?There were no vitals filed for this  visit. ? ?PHYSICAL EXAM: ?General:  Well appearing. No respiratory difficulty ?HEENT: normal ?Neck: supple. no JVD. Carotids 2+ bilat; no bruits. No lymphadenopathy or thryomegaly appreciated. ?Cor: PMI nondisplaced. Regular rate & rhythm. No rubs, gallops or murmurs. ?Lungs: clear ?Abdomen: soft, nontender, nondistended. No hepatosplenomegaly. No bruits or masses. Good bowel sounds. ?Extremities: no cyanosis, clubbing, rash, edema ?Neuro: alert & oriented x 3, cranial nerves grossly intact. moves all 4 extremities w/o difficulty. Affect pleasant. ? ?ECG: ? ? ?ASSESSMENT & PLAN: ? ?NYHA *** ?GDMT  ?Diuretic- ?BB- ?Ace/ARB/ARNI ?MRA ?SGLT2i ? ? ? ?Referred to HFSW (PCP, Medications, Transportation, ETOH Abuse, Drug Abuse, Insurance, Financial ): Yes or No ?Refer to Pharmacy: Yes or No ?Refer to Home Health:  Yes on No ?Refer to Advanced Heart Failure Clinic: Yes or no  ?Refer to General Cardiology: Yes or No ? ?Follow up   ?

## 2021-04-30 ENCOUNTER — Emergency Department (HOSPITAL_COMMUNITY): Payer: Medicare Other

## 2021-04-30 ENCOUNTER — Emergency Department (HOSPITAL_COMMUNITY)
Admission: EM | Admit: 2021-04-30 | Discharge: 2021-04-30 | Disposition: A | Discharge status: Home / Self Care | Payer: Medicare Other | Attending: Emergency Medicine | Admitting: Emergency Medicine

## 2021-04-30 ENCOUNTER — Encounter (HOSPITAL_COMMUNITY): Payer: Self-pay

## 2021-04-30 DIAGNOSIS — Z7982 Long term (current) use of aspirin: Secondary | ICD-10-CM | POA: Insufficient documentation

## 2021-04-30 DIAGNOSIS — R8281 Pyuria: Secondary | ICD-10-CM | POA: Insufficient documentation

## 2021-04-30 DIAGNOSIS — Z20822 Contact with and (suspected) exposure to covid-19: Secondary | ICD-10-CM | POA: Insufficient documentation

## 2021-04-30 DIAGNOSIS — I509 Heart failure, unspecified: Secondary | ICD-10-CM | POA: Insufficient documentation

## 2021-04-30 DIAGNOSIS — R519 Headache, unspecified: Secondary | ICD-10-CM | POA: Diagnosis not present

## 2021-04-30 DIAGNOSIS — R319 Hematuria, unspecified: Secondary | ICD-10-CM | POA: Insufficient documentation

## 2021-04-30 LAB — CBC WITH DIFFERENTIAL/PLATELET
Abs Immature Granulocytes: 0 10*3/uL (ref 0.00–0.07)
Basophils Absolute: 0 10*3/uL (ref 0.0–0.1)
Basophils Relative: 1 %
Eosinophils Absolute: 0 10*3/uL (ref 0.0–0.5)
Eosinophils Relative: 1 %
HCT: 44.1 % (ref 39.0–52.0)
Hemoglobin: 13.3 g/dL (ref 13.0–17.0)
Immature Granulocytes: 0 %
Lymphocytes Relative: 32 %
Lymphs Abs: 1.2 10*3/uL (ref 0.7–4.0)
MCH: 25.5 pg — ABNORMAL LOW (ref 26.0–34.0)
MCHC: 30.2 g/dL (ref 30.0–36.0)
MCV: 84.5 fL (ref 80.0–100.0)
Monocytes Absolute: 0.3 10*3/uL (ref 0.1–1.0)
Monocytes Relative: 9 %
Neutro Abs: 2.1 10*3/uL (ref 1.7–7.7)
Neutrophils Relative %: 57 %
Platelets: 173 10*3/uL (ref 150–400)
RBC: 5.22 MIL/uL (ref 4.22–5.81)
RDW: 22.3 % — ABNORMAL HIGH (ref 11.5–15.5)
WBC: 3.7 10*3/uL — ABNORMAL LOW (ref 4.0–10.5)
nRBC: 0 % (ref 0.0–0.2)

## 2021-04-30 LAB — URINALYSIS, ROUTINE W REFLEX MICROSCOPIC
Bilirubin Urine: NEGATIVE
Glucose, UA: 150 mg/dL — AB
Ketones, ur: NEGATIVE mg/dL
Leukocytes,Ua: NEGATIVE
Nitrite: NEGATIVE
Protein, ur: 100 mg/dL — AB
RBC / HPF: >50 RBC/hpf — ABNORMAL HIGH (ref 0–5)
Specific Gravity, Urine: 1.017 (ref 1.005–1.030)
WBC, UA: >50 WBC/hpf — ABNORMAL HIGH (ref 0–5)
pH: 5 (ref 5.0–8.0)

## 2021-04-30 LAB — COMPREHENSIVE METABOLIC PANEL
ALT: 33 U/L (ref 0–44)
AST: 26 U/L (ref 15–41)
Albumin: 3.3 g/dL — ABNORMAL LOW (ref 3.5–5.0)
Alkaline Phosphatase: 118 U/L (ref 38–126)
Anion gap: 13 (ref 5–15)
BUN: 22 mg/dL (ref 8–23)
CO2: 17 mmol/L — ABNORMAL LOW (ref 22–32)
Calcium: 8.9 mg/dL (ref 8.9–10.3)
Chloride: 106 mmol/L (ref 98–111)
Creatinine, Ser: 1.77 mg/dL — ABNORMAL HIGH (ref 0.61–1.24)
GFR, Estimated: 38 mL/min — ABNORMAL LOW (ref >60)
Glucose, Bld: 78 mg/dL (ref 70–99)
Potassium: 4.2 mmol/L (ref 3.5–5.1)
Sodium: 136 mmol/L (ref 135–145)
Total Bilirubin: 1.7 mg/dL — ABNORMAL HIGH (ref 0.3–1.2)
Total Protein: 7.7 g/dL (ref 6.5–8.1)

## 2021-04-30 LAB — RESP PANEL BY RT-PCR (FLU A&B, COVID) ARPGX2
Influenza A by PCR: NEGATIVE
Influenza B by PCR: NEGATIVE
SARS Coronavirus 2 by RT PCR: NEGATIVE

## 2021-04-30 IMAGING — CT CT HEAD W/O CM
4 series · 15 of 47 positions shown, 17 images · non-contrast
Comparison: [DATE]

CLINICAL DATA: Headache.



[Series 3: head wo · axial · 0.47mm/px · z∈[-106,+14]mm · 7 of 32 slices shown, 9 images]
[im 4/32  brain]
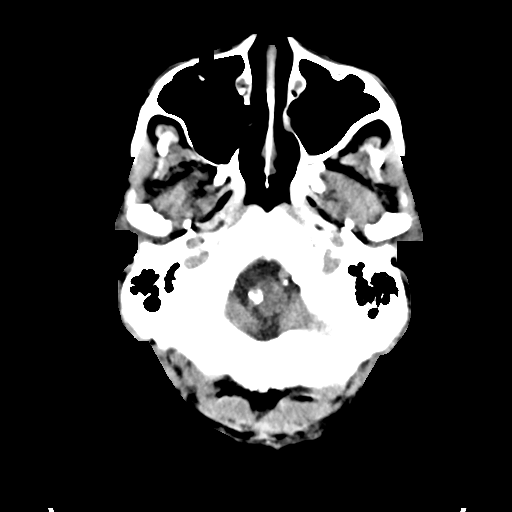
[im 4/32  bone]
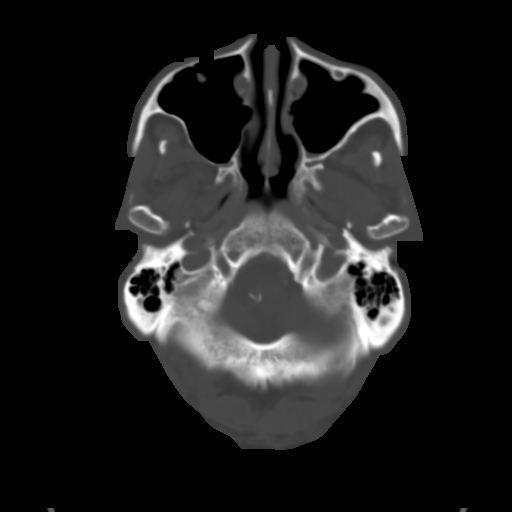
[im 8/32  brain]
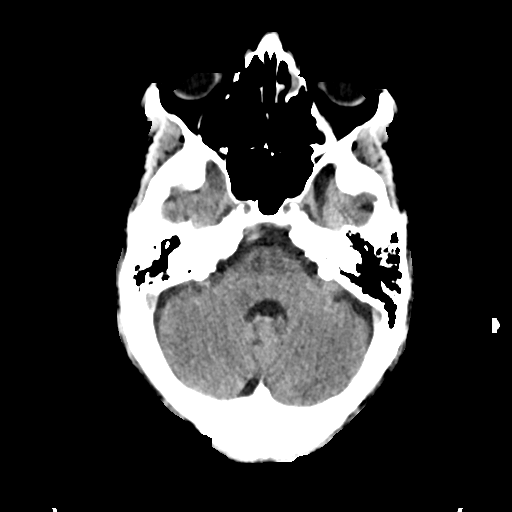
[im 12/32  brain]
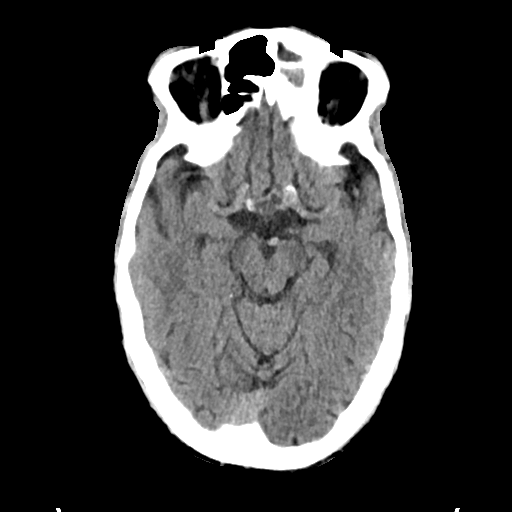
[im 16/32  brain]
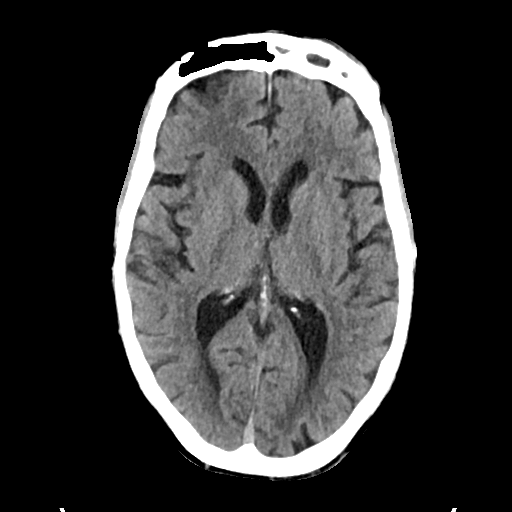
[im 20/32  brain]
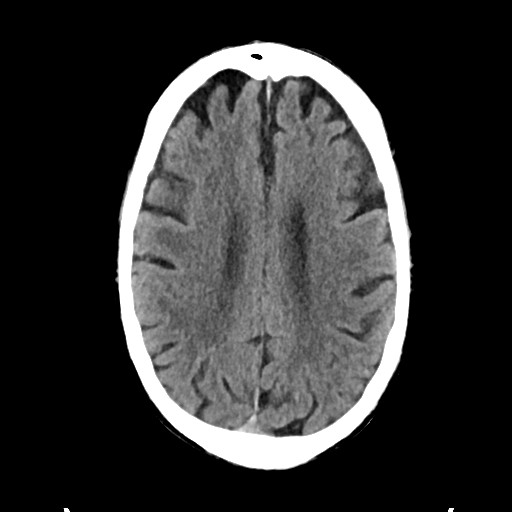
[im 20/32  bone]
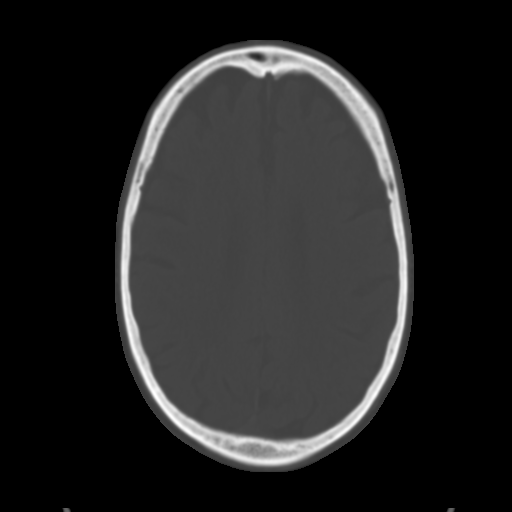
[im 24/32  brain]
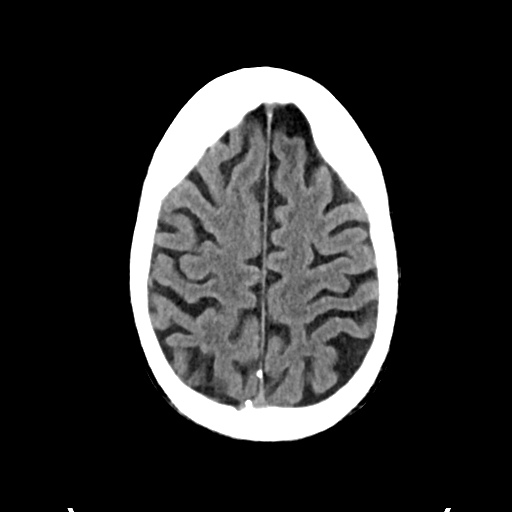
[im 28/32  brain]
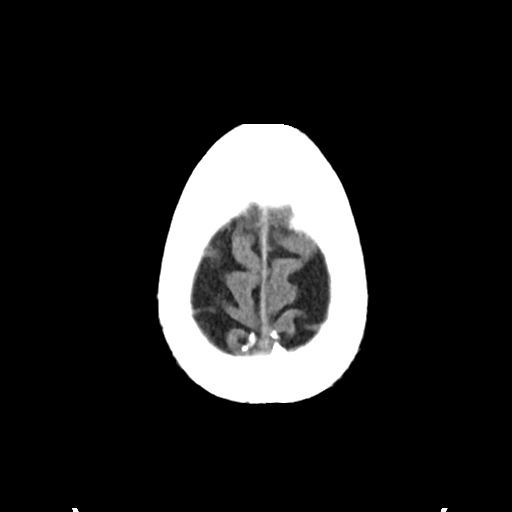

[Series 4: head bone · axial · 0.47mm/px · z∈[-107,-91]mm · 2 of 80 slices shown]
[im 8/80  bone]
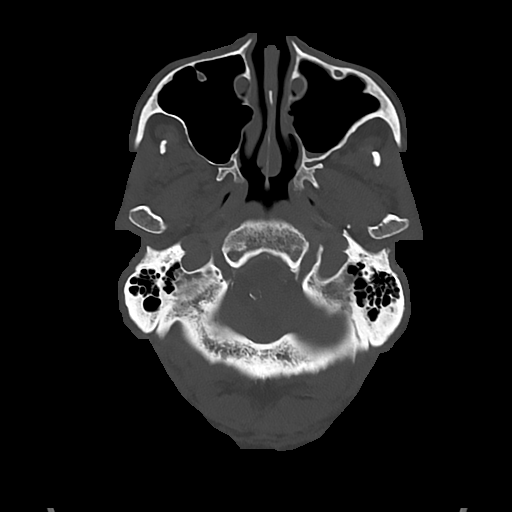
[im 16/80  bone]
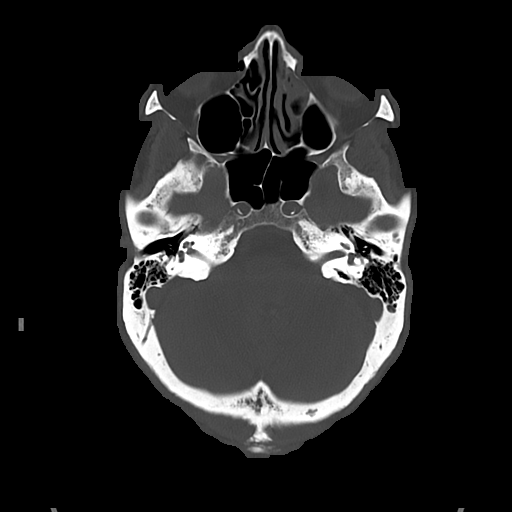

[Series 5: cor soft · coronal · 0.31mm/px · 3 of 75 slices shown]
[im 25/75  brain]
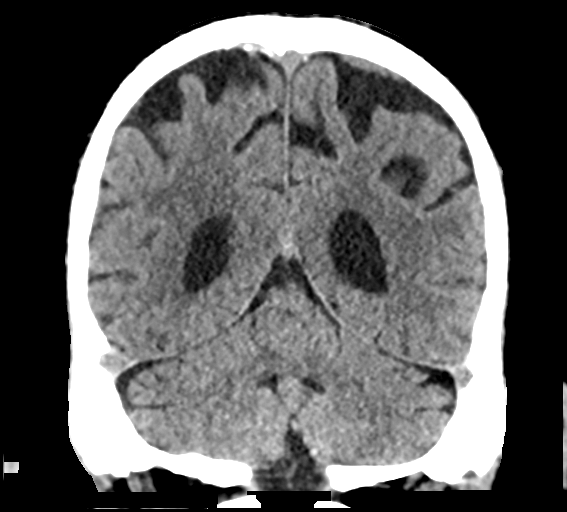
[im 33/75  brain]
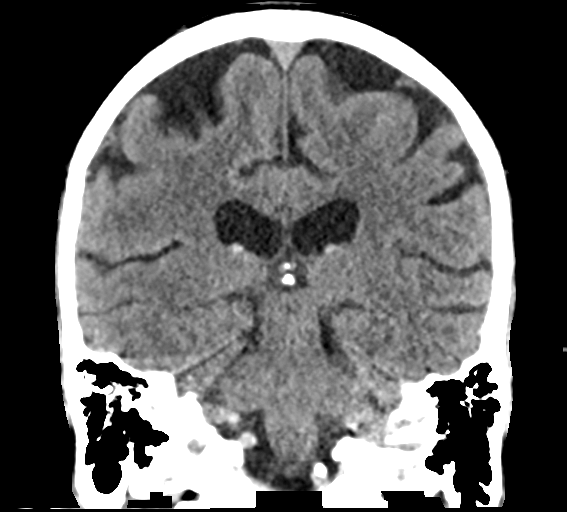
[im 42/75  brain]
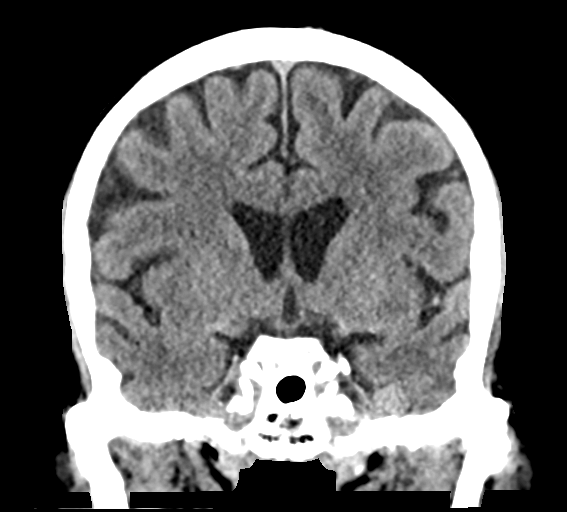

[Series 6: sag soft · sagittal · 0.31mm/px · 3 of 58 slices shown]
[im 20/58  brain]
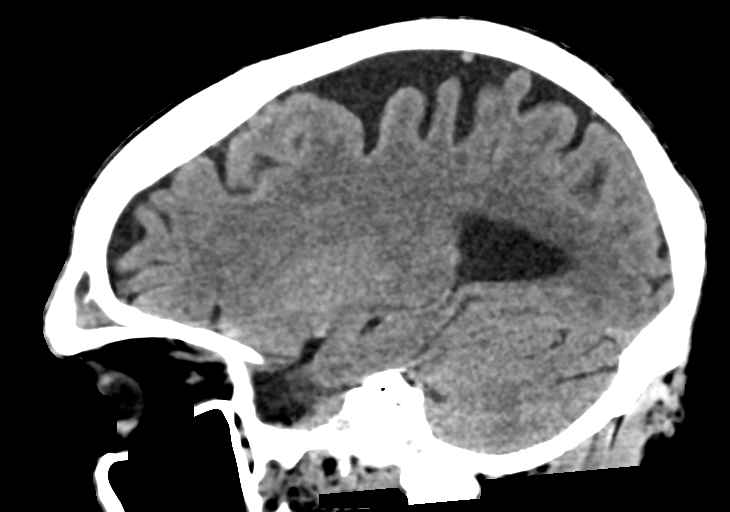
[im 29/58  brain]
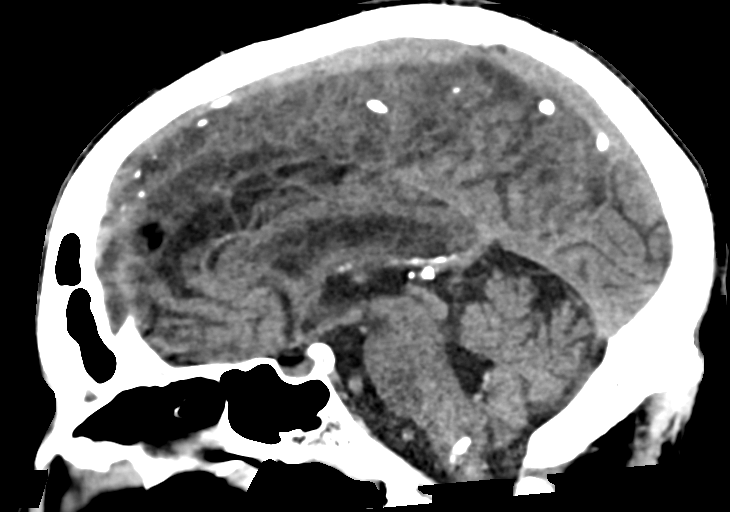
[im 39/58  brain]
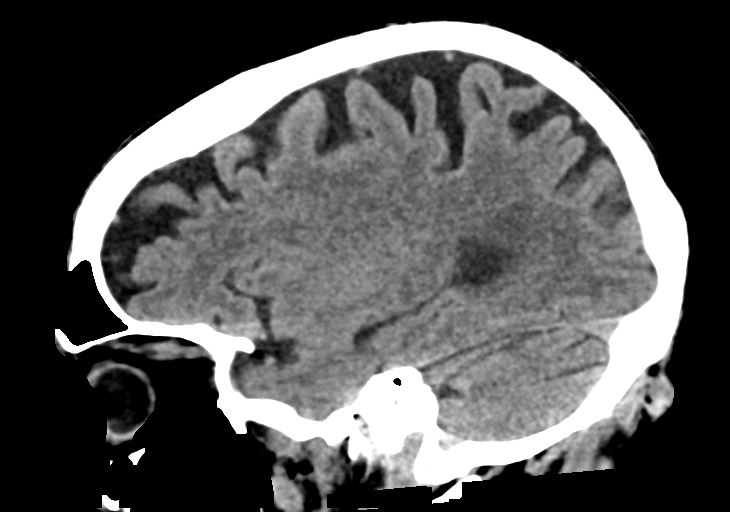

[15 of 47 positions shown; findings below may reference images not displayed]

FINDINGS: Brain: There is no evidence for acute hemorrhage, hydrocephalus,
mass lesion, or abnormal extra-axial fluid collection. No definite
CT evidence for acute infarction. Diffuse loss of parenchymal volume
is consistent with atrophy.

Vascular: No hyperdense vessel or unexpected calcification.

Skull: No evidence for fracture. No worrisome lytic or sclerotic
lesion.

Sinuses/Orbits: Persistent opacifications left frontal sinus and
anterior ethmoid air cells. Interval decrease in size of the left
maxillary sinus air-fluid level with decreasing mucosal thickness in
the left maxillary sinus. Visualized portions of the globes and
intraorbital fat are unremarkable.

Other: None.
IMPRESSION: 1. No acute intracranial abnormality.
2. Mild atrophy.
3. Interval decrease in size of the left maxillary sinus air-fluid
level with decreasing mucosal thickness in the left maxillary sinus.
Similar chronic opacification left final sinus.

## 2021-04-30 IMAGING — DX DG CHEST 1V PORT
1 series · 1 of 1 positions shown · non-contrast
Comparison: Chest x-ray [DATE].

CLINICAL DATA: Evaluate for CHF.

EXAM:
PORTABLE CHEST 1 VIEW

[chest ap]
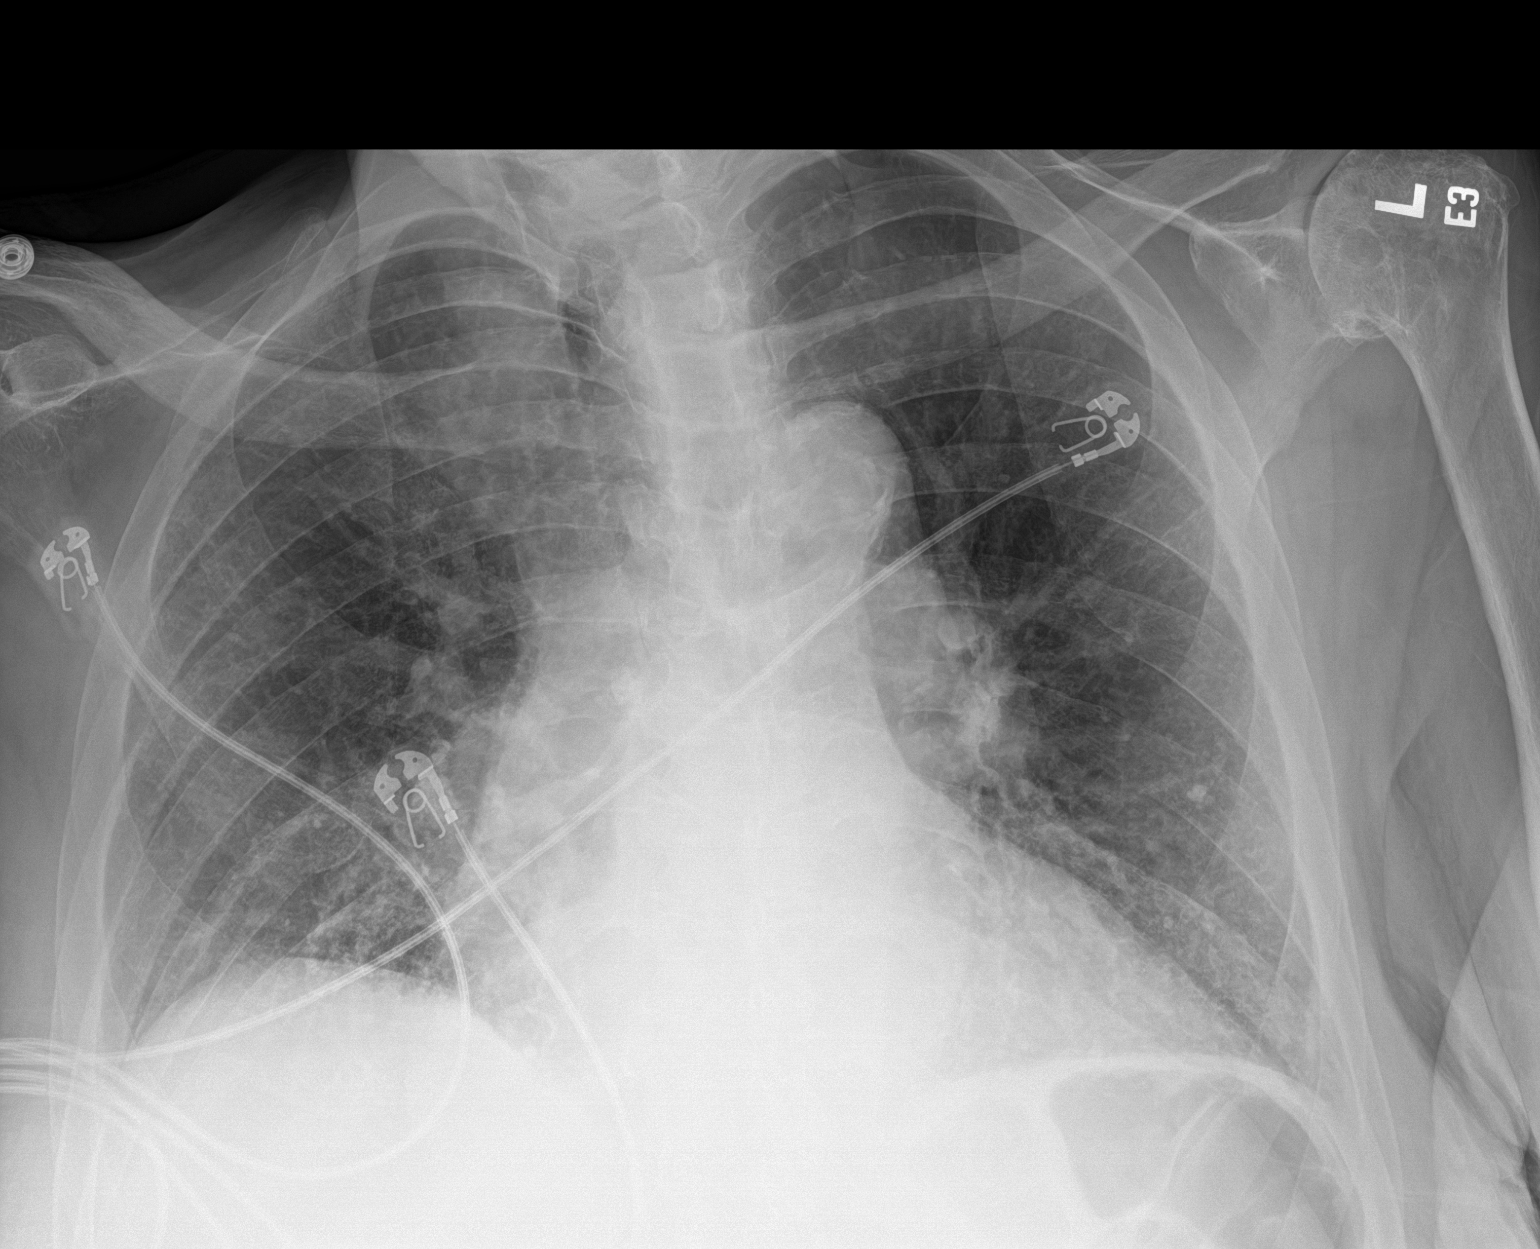

[1 of 1 positions shown; findings below may reference images not displayed]

FINDINGS: Cardiomediastinal silhouette is prominent and unchanged.
Interstitial opacities are seen in both lung bases. No pleural
effusion or pneumothorax identified. No acute fractures are seen.
IMPRESSION: 1. Bibasilar interstitial opacities may represent mild edema or
infection.
2. Stable cardiomegaly.

## 2021-04-30 MED ORDER — TAMSULOSIN HCL 0.4 MG PO CAPS
0.4000 mg | ORAL_CAPSULE | Freq: Every day | ORAL | 0 refills | Status: DC
Start: 2021-04-30 — End: 2021-05-08

## 2021-04-30 MED ORDER — CEPHALEXIN 500 MG PO CAPS: 500.0000 mg | ORAL_CAPSULE | Freq: Two times a day (BID) | ORAL | 0 refills | Status: DC

## 2021-04-30 NOTE — ED Provider Notes (Signed)
?[facility] ?Provider Note ? ? ?CSN: [id number] ?Arrival date & time: 04/30/21  1239 ? ?  ? ?History ? ?Chief Complaint  ?Patient presents with  ? URI  ? Headache  ? ? ?Matthew [name] is a 81 y.o. male. ? ?HPI ? ?  ? ?81 year old male with history of congestive heart failure, anemia, kidney injury with recent admission to the hospital from February 20 to February 24 for diuresing comes in with chief complaint of hot and cold sensation and headache. ? ?Patient indicates that he has not been feeling well over the last 2 weeks.  Mostly has been having some hot and cold sensation throughout the day including chills.  He denies any associated headache right now, neck pain, chest pain, shortness of breath, dizziness, lightheadedness, abdominal pain, nausea, vomiting, diarrhea, bloody stools and indicates that his diet is normal.  He has no orthopnea or PND and states that he takes all his medications as prescribed, they are typically managed by his daughter. ? ?I spoke with patient's daughter over the phone.  She indicates that patient has complained of hot and cold sensation to her, and has not followed up with cardiologist as planned.  Today patient came to her and again indicated that he was not feeling well, and they decided to call EMS to bring him to the ER. ? ?Patient did mention headache when EMS arrived, but other than that he has not complained of severe headaches.  There is no trauma. ? ?Per daughter, patient has had UTI in the past.  Patient denies any burning with urination at this time. ? ?Home Medications ?Prior to Admission medications   ?Medication Sig Start Date End Date Taking? Authorizing Provider  ?cephALEXin (KEFLEX) 500 MG capsule Take 1 capsule (500 mg total) by mouth 2 (two) times daily. 04/30/21  Yes Varney Biles, MD  ?tamsulosin (FLOMAX) 0.4 MG CAPS capsule Take 1 capsule (0.4 mg total) by mouth daily. 04/30/21  Yes Varney Biles, MD  ?acetaminophen  (TYLENOL) 500 MG tablet Take 500 mg by mouth every 6 (six) hours as needed for moderate pain.    [provider]  ?aspirin EC 81 MG tablet Take 1 tablet (81 mg total) by mouth daily. 03/29/17   Jettie Booze, MD  ?atorvastatin (LIPITOR) 10 MG tablet TAKE 1 TABLET(10 MG) BY MOUTH DAILY 07/19/20   Jettie Booze, MD  ?dapagliflozin propanediol (FARXIGA) 10 MG TABS tablet Take 1 tablet (10 mg total) by mouth daily. 07/09/20   Jettie Booze, MD  ?furosemide (LASIX) 20 MG tablet Take 1 tablet (20 mg total) by mouth 2 (two) times daily. 04/08/21   Eppie Gibson, MD  ?metoprolol succinate (TOPROL-XL) 25 MG 24 hr tablet Take 1.5 tablets (37.5 mg total) by mouth at bedtime. 04/08/21   Eppie Gibson, MD  ?Misc Natural Products [facility]) CAPS Take 1 capsule by mouth in the morning and at bedtime.    [provider]  ?sacubitril-valsartan (ENTRESTO) 24-26 MG Take 1 tablet by mouth 2 (two) times daily. 04/08/21   Eppie Gibson, MD  ?spironolactone (ALDACTONE) 25 MG tablet Take 1 tablet (25 mg total) by mouth at bedtime. 04/09/21   Eppie Gibson, MD  ?   ? ?Allergies    ?Lactose intolerance (gi)   ? ?Review of Systems   ?Review of Systems  ?All other systems reviewed and are negative. ? ?Physical Exam ?Updated Vital Signs ?BP (!) 130/96   Pulse 100  Temp (!) 97.4 ?F (36.3 ?C) (Oral)   Resp 18   SpO2 99%  ?Physical Exam ?Vitals and nursing note reviewed.  ?Constitutional:   ?   Appearance: He is well-developed.  ?HENT:  ?   Head: Atraumatic.  ?Cardiovascular:  ?   Rate and Rhythm: Normal rate.  ?Pulmonary:  ?   Effort: Pulmonary effort is normal.  ?Musculoskeletal:  ?   Cervical back: Neck supple.  ?Lymphadenopathy:  ?   Cervical: No cervical adenopathy.  ?Skin: ?   General: Skin is warm.  ?Neurological:  ?   Mental Status: He is alert and oriented to person, place, and time.  ?   GCS: GCS eye subscore is 4. GCS verbal subscore is 5. GCS motor subscore is 6.  ? ? ?ED Results /  Procedures / Treatments   ?Labs ?(all labs ordered are listed, but only abnormal results are displayed) ?Labs Reviewed  ?CBC WITH DIFFERENTIAL/PLATELET - Abnormal; Notable for the following components:  ?    Result Value  ? WBC 3.7 (*)   ? MCH 25.5 (*)   ? RDW 22.3 (*)   ? All other components within normal limits  ?COMPREHENSIVE METABOLIC PANEL - Abnormal; Notable for the following components:  ? CO2 17 (*)   ? Creatinine, Ser 1.77 (*)   ? Albumin 3.3 (*)   ? Total Bilirubin 1.7 (*)   ? GFR, Estimated 38 (*)   ? All other components within normal limits  ?URINALYSIS, ROUTINE W REFLEX MICROSCOPIC - Abnormal; Notable for the following components:  ? Color, Urine AMBER (*)   ? APPearance HAZY (*)   ? Glucose, UA 150 (*)   ? Hgb urine dipstick LARGE (*)   ? Protein, ur 100 (*)   ? RBC / HPF >50 (*)   ? WBC, UA >50 (*)   ? Bacteria, UA RARE (*)   ? All other components within normal limits  ?RESP PANEL BY RT-PCR (FLU A&B, COVID) [id number]  ?URINE CULTURE  ? ? ?EKG ?None ? ?Radiology ?CT Head Wo Contrast ? ?Result Date: 04/30/2021 ?CLINICAL DATA:  Headache. EXAM: CT HEAD WITHOUT CONTRAST TECHNIQUE: Contiguous axial images were obtained from the base of the skull through the vertex without intravenous contrast. RADIATION DOSE REDUCTION: This exam was performed according to the departmental dose-optimization program which includes automated exposure control, adjustment of the mA and/or kV according to patient size and/or use of iterative reconstruction technique. COMPARISON:  04/04/2021 FINDINGS: Brain: There is no evidence for acute hemorrhage, hydrocephalus, mass lesion, or abnormal extra-axial fluid collection. No definite CT evidence for acute infarction. Diffuse loss of parenchymal volume is consistent with atrophy. Vascular: No hyperdense vessel or unexpected calcification. Skull: No evidence for fracture. No worrisome lytic or sclerotic lesion. Sinuses/Orbits: Persistent opacifications left frontal sinus and anterior  ethmoid air cells. Interval decrease in size of the left maxillary sinus air-fluid level with decreasing mucosal thickness in the left maxillary sinus. Visualized portions of the globes and intraorbital fat are unremarkable. Other: None. IMPRESSION: 1. No acute intracranial abnormality. 2. Mild atrophy. 3. Interval decrease in size of the left maxillary sinus air-fluid level with decreasing mucosal thickness in the left maxillary sinus. Similar chronic opacification left final sinus. Electronically Signed   By: Misty Stanley M.D.   On: 04/30/2021 17:21  ? ?DG Chest Port 1 View ? ?Result Date: 04/30/2021 ?CLINICAL DATA:  Evaluate for CHF. EXAM: PORTABLE CHEST 1 VIEW COMPARISON:  Chest x-ray 04/04/2021. FINDINGS: Cardiomediastinal silhouette is prominent and  unchanged. Interstitial opacities are seen in both lung bases. No pleural effusion or pneumothorax identified. No acute fractures are seen. IMPRESSION: 1. Bibasilar interstitial opacities may represent mild edema or infection. 2. Stable cardiomegaly. Electronically Signed   By: Ronney Asters M.D.   On: 04/30/2021 17:34   ? ?Procedures ?Procedures  ? ? ?Medications Ordered in ED ?Medications - No data to display ? ?ED Course/ Medical Decision Making/ A&P ?Clinical Course as of 04/30/21 2119  ?Sat Apr 30, 2021  ?2118 Patient's urine has hematuria, pyuria.  It appears that he was treated with antibiotics last time.  He had complained of some burning with urination.  He indicates now that he has off-and-on burning with urination. ? ?Spoke with patient's daughter.  After lengthy discussion, plan is to treat him for UTI and have him follow-up with his PCP.  I informed family that given his age and wake symptoms, hematuria could be because of numerous reasons besides UTI such as prostate issues and even tumor/cancer.  There is an appointment with PCP coming up in April.  I have sent a message to patient's CHF coordinator to see if they can get an appointment  sooner. ? ?Return precautions discussed with the patient and the daughter. ? ?Encouraged PCP follow-up and cardiology follow-up. [AN]  ?  ?Clinical Course User Index ?[AN] Varney Biles, MD  ? ?                        ?

## 2021-04-30 NOTE — ED Notes (Signed)
Pt needed 3x assist to transfer to bed from wheelchair due to weakness and unsteady gait, unlikely that he can be safely ambulated for pulse ox reading. RN updated EDP of same. ?

## 2021-04-30 NOTE — ED Triage Notes (Signed)
Pt arrives via [facility] for c/o headache and chills x3 days.  ? ?EMS last VS - 99.5 temp, 144/92, HR 96 RR 20 99% on RA. 103 CBG.  ?

## 2021-04-30 NOTE — ED Notes (Signed)
Pt to CT

## 2021-04-30 NOTE — Discharge Instructions (Addendum)
You are seen in the ER for weakness. ?The blood work is overall reassuring.  Your urine test is concerning for infection.  Given that there is history of UTI, we have prescribed you with antibiotics. ? ?There is some blood in the urine.  It could be because of infection, but given your age other possibilities are also possible such as prostate issues and even cancer.  Please follow-up with your primary care doctor as planned and discussed with them your urinary symptoms and have them repeat your urine analysis to ensure the blood has cleared. ? ?Also, please follow-up with your cardiologist by calling the number provided to set up a repeat appointment.  I have sent a message to them, so there is a possibility they might call you on Monday with an appointment. ?

## 2021-05-02 ENCOUNTER — Other Ambulatory Visit: Payer: Self-pay | Admitting: Interventional Cardiology

## 2021-05-02 LAB — URINE CULTURE: Culture: <10000 — AB

## 2021-05-04 ENCOUNTER — Other Ambulatory Visit: Payer: Self-pay

## 2021-05-04 MED ORDER — SACUBITRIL-VALSARTAN 24-26 MG PO TABS: 1.0000 | ORAL_TABLET | Freq: Two times a day (BID) | ORAL | 2 refills | Status: DC

## 2021-05-04 NOTE — Telephone Encounter (Signed)
Pt's medication was sent to pt's pharmacy as requested. Confirmation received.  °

## 2021-05-07 ENCOUNTER — Encounter (HOSPITAL_COMMUNITY): Payer: Self-pay

## 2021-05-07 ENCOUNTER — Emergency Department (HOSPITAL_COMMUNITY): Payer: Medicare Other

## 2021-05-07 ENCOUNTER — Inpatient Hospital Stay (HOSPITAL_COMMUNITY)
Admission: EM | Admit: 2021-05-07 | Discharge: 2021-05-10 | DRG: 640 | Disposition: A | Discharge status: Home Health | Payer: Medicare Other | Attending: Internal Medicine | Admitting: Internal Medicine

## 2021-05-07 DIAGNOSIS — N401 Enlarged prostate with lower urinary tract symptoms: Secondary | ICD-10-CM | POA: Diagnosis present

## 2021-05-07 DIAGNOSIS — R81 Glycosuria: Secondary | ICD-10-CM | POA: Diagnosis present

## 2021-05-07 DIAGNOSIS — G9341 Metabolic encephalopathy: Secondary | ICD-10-CM | POA: Diagnosis present

## 2021-05-07 DIAGNOSIS — M1990 Unspecified osteoarthritis, unspecified site: Secondary | ICD-10-CM | POA: Diagnosis present

## 2021-05-07 DIAGNOSIS — Z993 Dependence on wheelchair: Secondary | ICD-10-CM

## 2021-05-07 DIAGNOSIS — I5022 Chronic systolic (congestive) heart failure: Secondary | ICD-10-CM | POA: Diagnosis not present

## 2021-05-07 DIAGNOSIS — I420 Dilated cardiomyopathy: Secondary | ICD-10-CM | POA: Diagnosis present

## 2021-05-07 DIAGNOSIS — Z79899 Other long term (current) drug therapy: Secondary | ICD-10-CM

## 2021-05-07 DIAGNOSIS — R350 Frequency of micturition: Secondary | ICD-10-CM | POA: Diagnosis present

## 2021-05-07 DIAGNOSIS — E739 Lactose intolerance, unspecified: Secondary | ICD-10-CM | POA: Diagnosis present

## 2021-05-07 DIAGNOSIS — I110 Hypertensive heart disease with heart failure: Secondary | ICD-10-CM | POA: Diagnosis present

## 2021-05-07 DIAGNOSIS — R4182 Altered mental status, unspecified: Principal | ICD-10-CM

## 2021-05-07 DIAGNOSIS — T500X5A Adverse effect of mineralocorticoids and their antagonists, initial encounter: Secondary | ICD-10-CM | POA: Diagnosis present

## 2021-05-07 DIAGNOSIS — Z8249 Family history of ischemic heart disease and other diseases of the circulatory system: Secondary | ICD-10-CM | POA: Diagnosis not present

## 2021-05-07 DIAGNOSIS — Z87440 Personal history of urinary (tract) infections: Secondary | ICD-10-CM | POA: Diagnosis not present

## 2021-05-07 DIAGNOSIS — E785 Hyperlipidemia, unspecified: Secondary | ICD-10-CM | POA: Diagnosis not present

## 2021-05-07 DIAGNOSIS — E876 Hypokalemia: Secondary | ICD-10-CM | POA: Diagnosis not present

## 2021-05-07 DIAGNOSIS — I459 Conduction disorder, unspecified: Secondary | ICD-10-CM | POA: Diagnosis not present

## 2021-05-07 DIAGNOSIS — Z7982 Long term (current) use of aspirin: Secondary | ICD-10-CM

## 2021-05-07 DIAGNOSIS — E860 Dehydration: Principal | ICD-10-CM | POA: Diagnosis present

## 2021-05-07 DIAGNOSIS — I2510 Atherosclerotic heart disease of native coronary artery without angina pectoris: Secondary | ICD-10-CM | POA: Diagnosis present

## 2021-05-07 DIAGNOSIS — N400 Enlarged prostate without lower urinary tract symptoms: Secondary | ICD-10-CM

## 2021-05-07 LAB — COMPREHENSIVE METABOLIC PANEL
ALT: 16 U/L (ref 0–44)
AST: 19 U/L (ref 15–41)
Albumin: 3.3 g/dL — ABNORMAL LOW (ref 3.5–5.0)
Alkaline Phosphatase: 111 U/L (ref 38–126)
Anion gap: 10 (ref 5–15)
BUN: 19 mg/dL (ref 8–23)
CO2: 21 mmol/L — ABNORMAL LOW (ref 22–32)
Calcium: 8.9 mg/dL (ref 8.9–10.3)
Chloride: 104 mmol/L (ref 98–111)
Creatinine, Ser: 1.65 mg/dL — ABNORMAL HIGH (ref 0.61–1.24)
GFR, Estimated: 42 mL/min — ABNORMAL LOW (ref >60)
Glucose, Bld: 92 mg/dL (ref 70–99)
Potassium: 3.4 mmol/L — ABNORMAL LOW (ref 3.5–5.1)
Sodium: 135 mmol/L (ref 135–145)
Total Bilirubin: 1.6 mg/dL — ABNORMAL HIGH (ref 0.3–1.2)
Total Protein: 7.5 g/dL (ref 6.5–8.1)

## 2021-05-07 LAB — CBC WITH DIFFERENTIAL/PLATELET
Abs Immature Granulocytes: 0.01 10*3/uL (ref 0.00–0.07)
Basophils Absolute: 0 10*3/uL (ref 0.0–0.1)
Basophils Relative: 1 %
Eosinophils Absolute: 0 10*3/uL (ref 0.0–0.5)
Eosinophils Relative: 0 %
HCT: 42.9 % (ref 39.0–52.0)
Hemoglobin: 13.5 g/dL (ref 13.0–17.0)
Immature Granulocytes: 0 %
Lymphocytes Relative: 27 %
Lymphs Abs: 1.2 10*3/uL (ref 0.7–4.0)
MCH: 25.4 pg — ABNORMAL LOW (ref 26.0–34.0)
MCHC: 31.5 g/dL (ref 30.0–36.0)
MCV: 80.8 fL (ref 80.0–100.0)
Monocytes Absolute: 0.5 10*3/uL (ref 0.1–1.0)
Monocytes Relative: 11 %
Neutro Abs: 2.7 10*3/uL (ref 1.7–7.7)
Neutrophils Relative %: 61 %
Platelets: 123 10*3/uL — ABNORMAL LOW (ref 150–400)
RBC: 5.31 MIL/uL (ref 4.22–5.81)
RDW: 22 % — ABNORMAL HIGH (ref 11.5–15.5)
WBC: 4.3 10*3/uL (ref 4.0–10.5)
nRBC: 0 % (ref 0.0–0.2)

## 2021-05-07 LAB — AMMONIA: Ammonia: 34 umol/L (ref 9–35)

## 2021-05-07 LAB — CBG MONITORING, ED: Glucose-Capillary: 83 mg/dL (ref 70–99)

## 2021-05-07 LAB — LACTIC ACID, PLASMA: Lactic Acid, Venous: 1.6 mmol/L (ref 0.5–1.9)

## 2021-05-07 IMAGING — CT CT HEAD W/O CM
4 series · 16 of 47 positions shown, 18 images · non-contrast
Comparison: [DATE]

CLINICAL DATA: Altered mental status



[Series 3: head wo · axial · 0.47mm/px · z∈[-101,+19]mm · 7 of 33 slices shown, 9 images]
[im 5/33  brain]
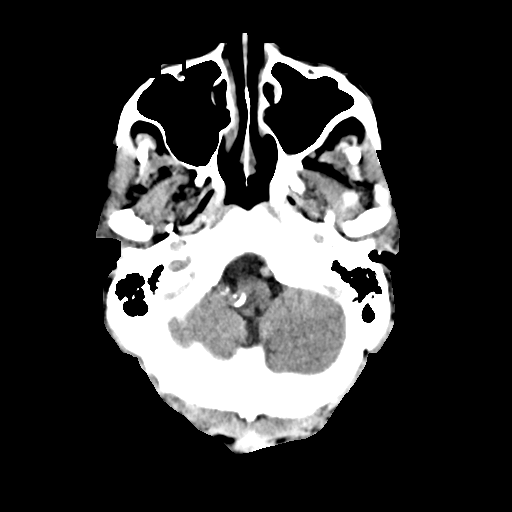
[im 5/33  bone]
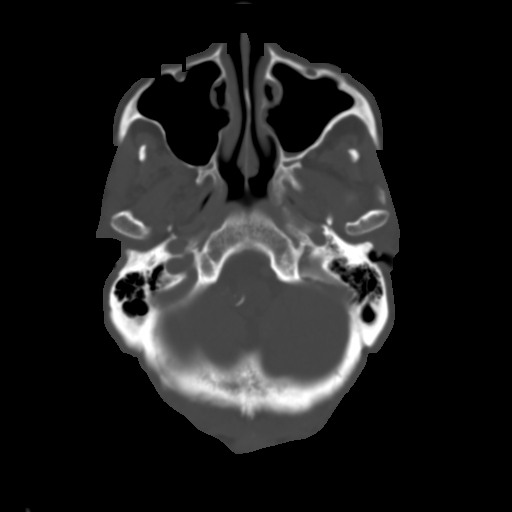
[im 9/33  brain]
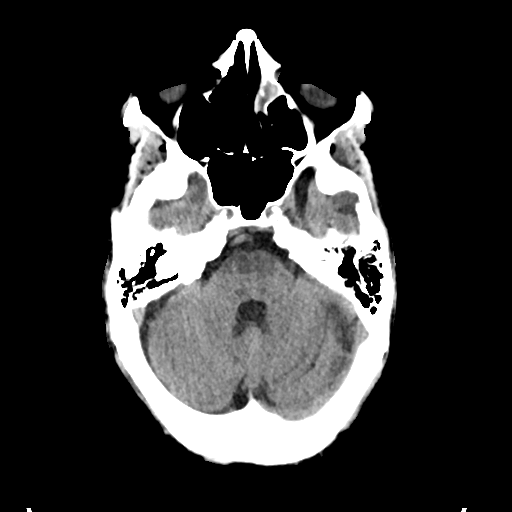
[im 13/33  brain]
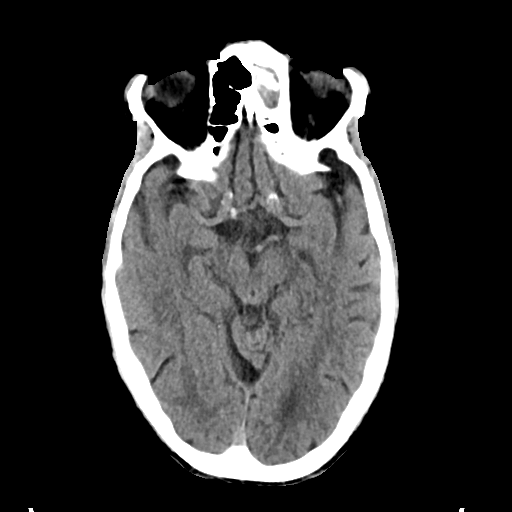
[im 17/33  brain]
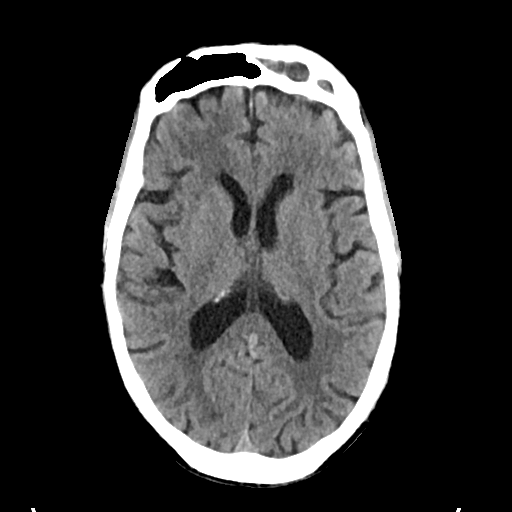
[im 21/33  brain]
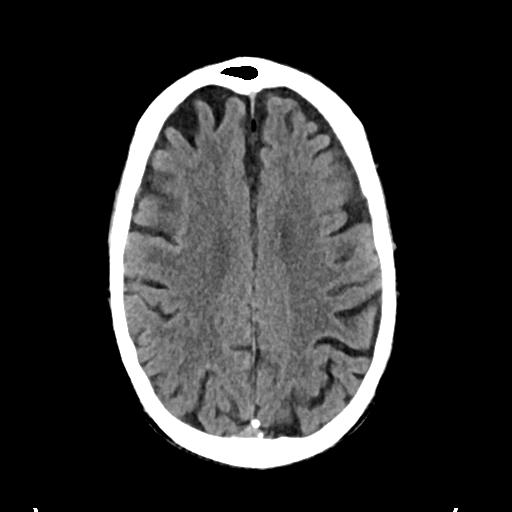
[im 21/33  bone]
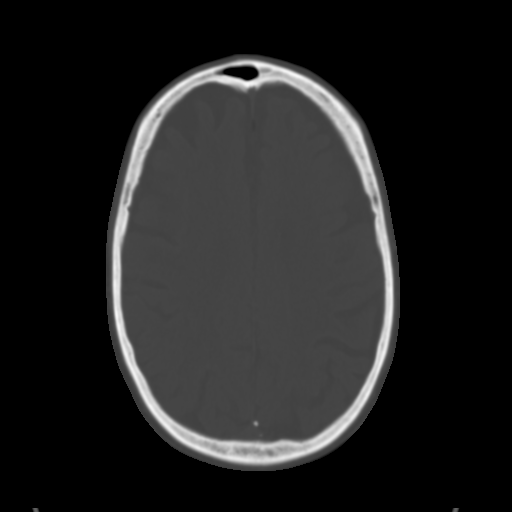
[im 25/33  brain]
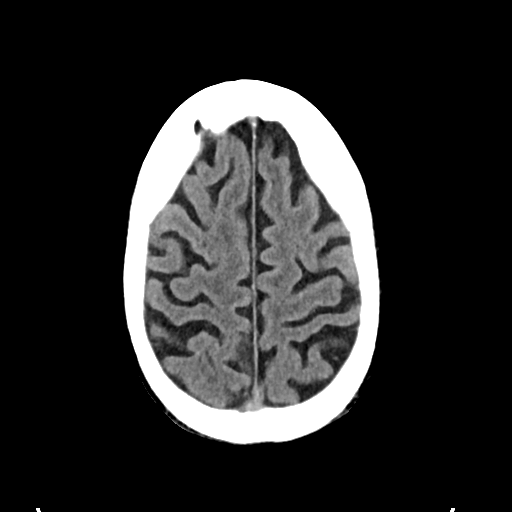
[im 29/33  brain]
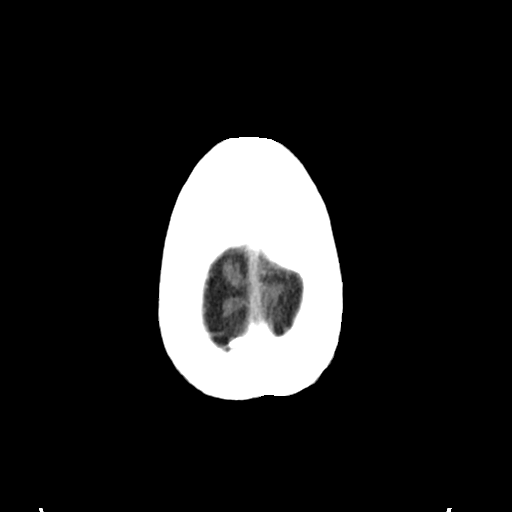

[Series 4: head bone · axial · 0.47mm/px · z∈[-105,-73]mm · 3 of 82 slices shown]
[im 9/82  bone]
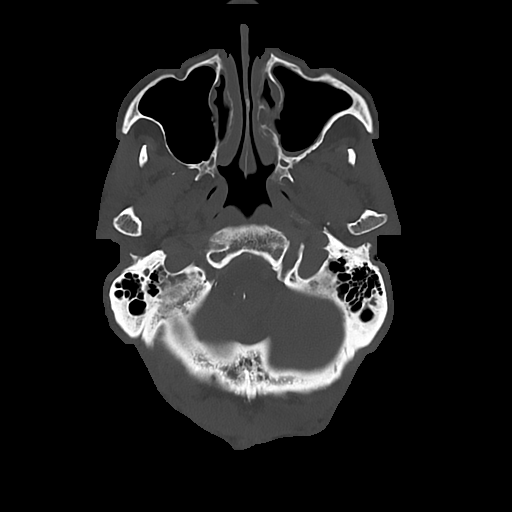
[im 17/82  bone]
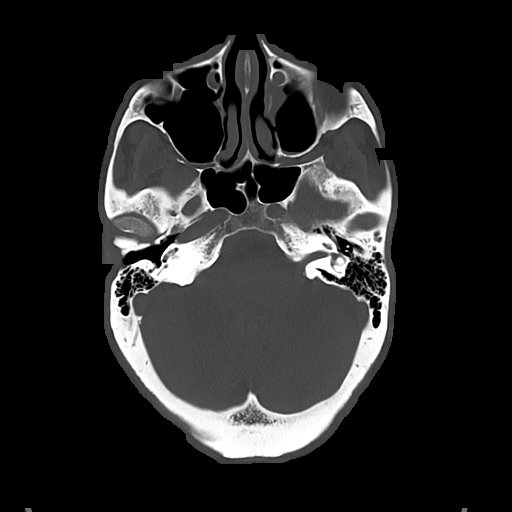
[im 25/82  bone]
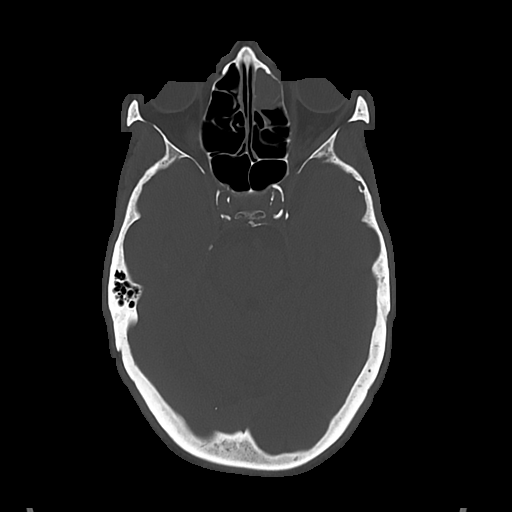

[Series 5: cor soft · coronal · 0.35mm/px · 3 of 79 slices shown]
[im 27/79  brain]
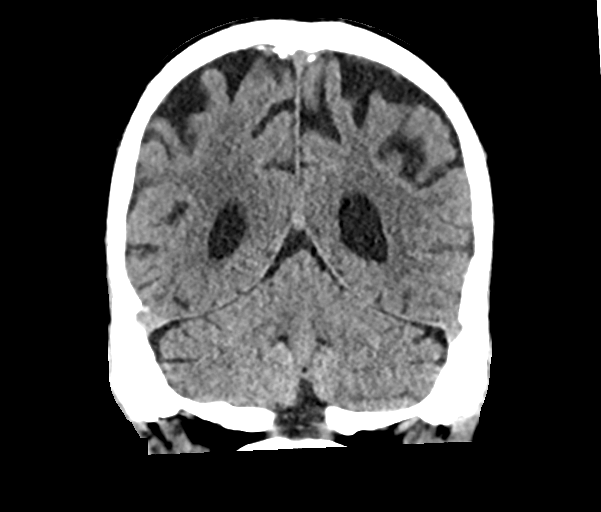
[im 35/79  brain]
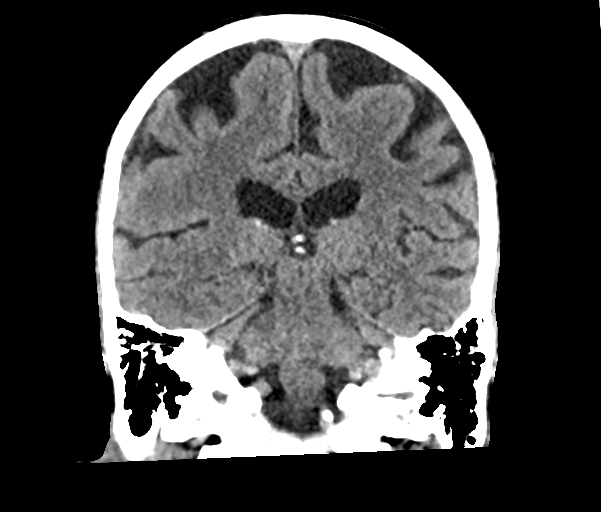
[im 44/79  brain]
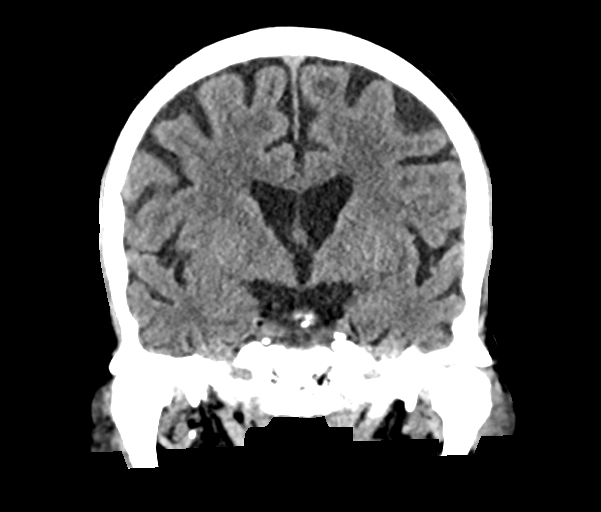

[Series 6: sag soft · sagittal · 0.37mm/px · 3 of 62 slices shown]
[im 21/62  brain]
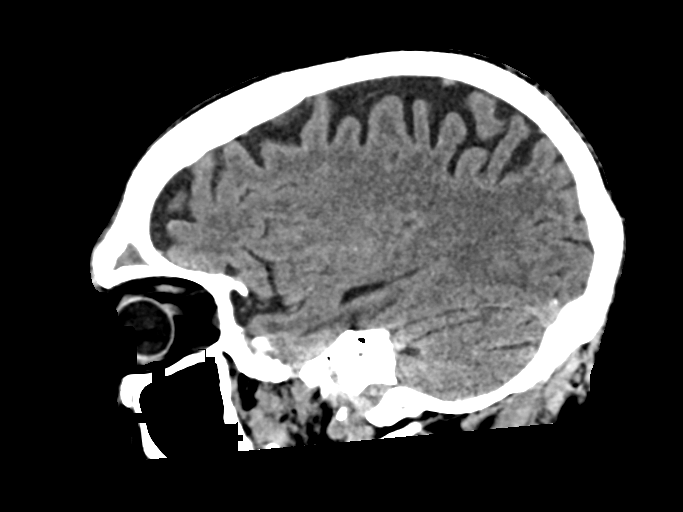
[im 31/62  brain]
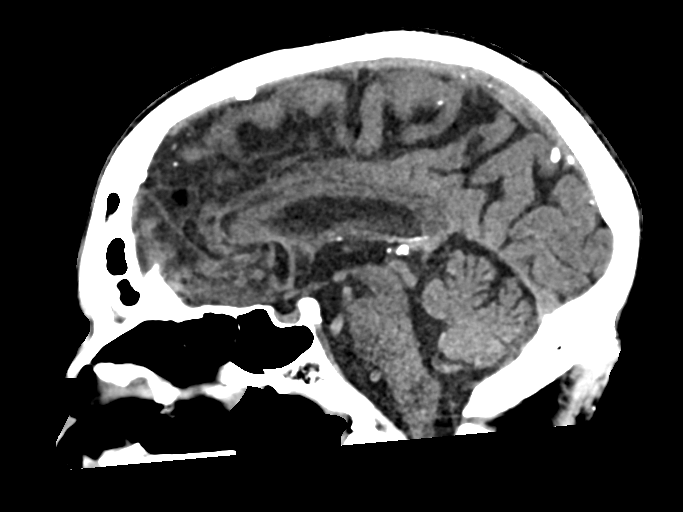
[im 41/62  brain]
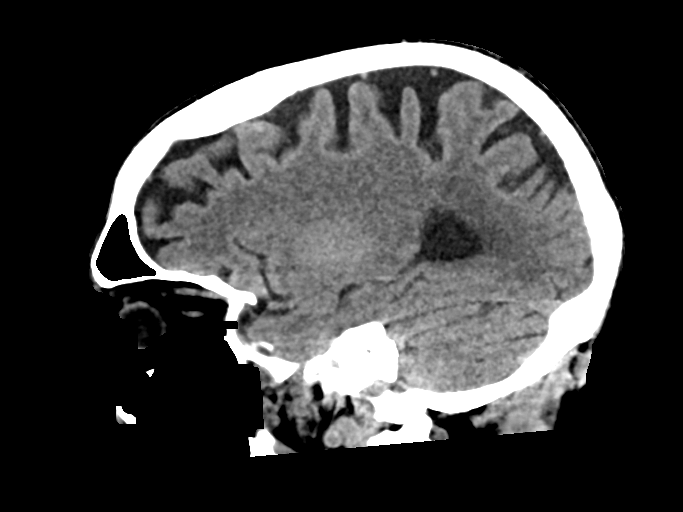

[16 of 47 positions shown; findings below may reference images not displayed]

FINDINGS: Brain: No evidence of acute infarction, hemorrhage, hydrocephalus,
extra-axial collection or mass lesion/mass effect. Mild atrophic
changes and chronic white matter ischemic changes are seen.

Vascular: No hyperdense vessel or unexpected calcification.

Skull: Normal. Negative for fracture or focal lesion.

Sinuses/Orbits: Mucosal thickening is noted within the left
maxillary antrum. This is increased in the interval from the prior
exam. Opacification of the left frontal sinus and portions the left
ethmoid sinuses are noted chronic in nature.

Other: None.
IMPRESSION: Chronic atrophic and ischemic changes.

Chronic sinus changes as described.

## 2021-05-07 IMAGING — CR DG CHEST 1V
1 series · 1 of 1 positions shown · non-contrast
Comparison: [DATE]

CLINICAL DATA: Altered mental status

EXAM:
CHEST  1 VIEW

[chest ap]
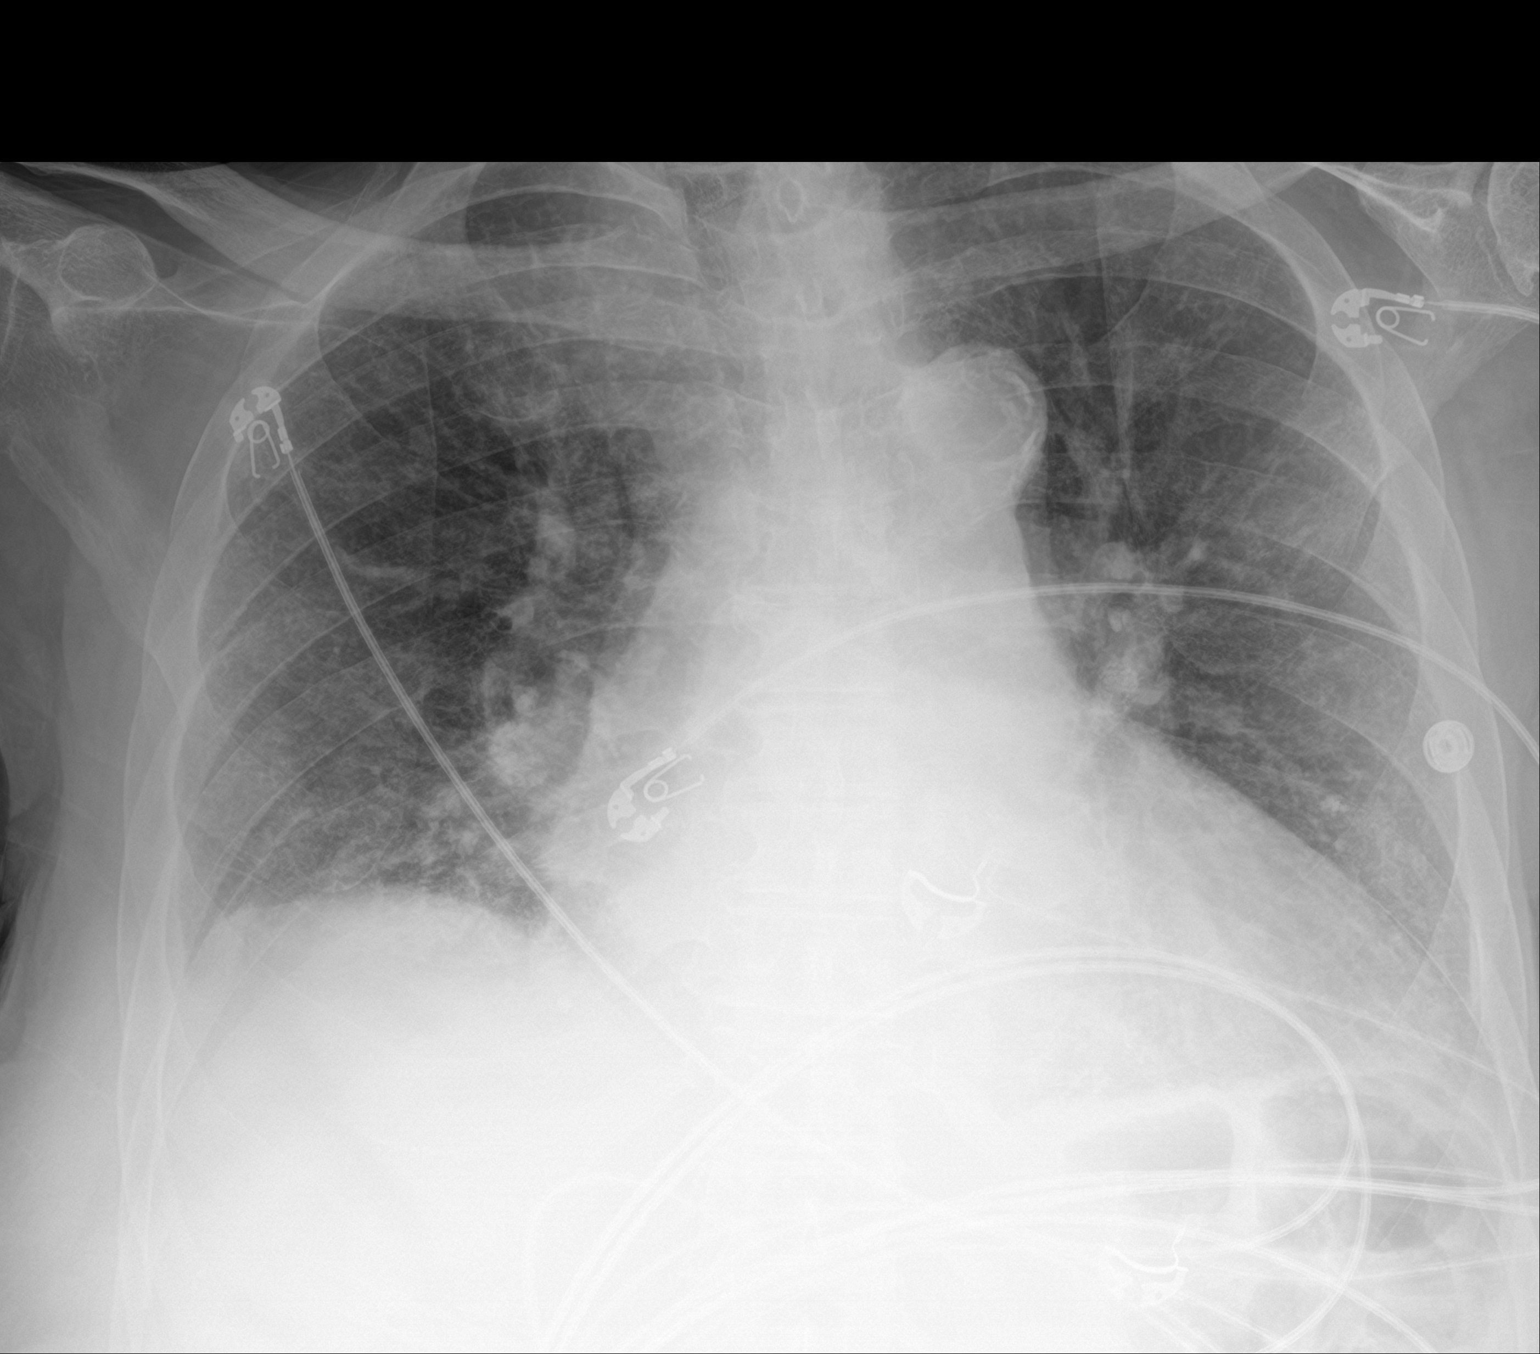

[1 of 1 positions shown; findings below may reference images not displayed]

FINDINGS: Check shadow is enlarged. Aortic calcifications are noted. The lungs
are well aerated bilaterally. No focal infiltrate or sizable
effusion is seen. No bony abnormality is noted.
IMPRESSION: No active disease.

## 2021-05-07 MED ORDER — ACETAMINOPHEN 500 MG PO TABS
1000.0000 mg | ORAL_TABLET | Freq: Once | ORAL | Status: AC
  Administered 2021-05-08: 1000 mg via ORAL
  Filled 2021-05-07: qty 2

## 2021-05-07 NOTE — ED Triage Notes (Signed)
Pt BIB [facility] from home c/o AMS. LSN 1830. Currently being treated for UTI w antibiotics. Pt became nonverbal w son around 49, c/o SOB. All symptoms are resolved upon hospital arrival, except for confusion. Hx of CHF. Bi lateral edema noted on lower extremities. ? ?Baseline: ambulatory w [facility]. A&O X4. Lives w daughter.  Room air.  ? ?BP 120/86 ?HR 50 ?SPO 100% RA ?97.1 temp  ?

## 2021-05-07 NOTE — ED Provider Notes (Signed)
?[facility] ?Provider Note ? ? ?CSN: [id number] ?Arrival date & time: 05/07/21  2041 ? ?  ? ?History ? ?Chief Complaint  ?Patient presents with  ? Altered Mental Status  ? ? ?Matthew [name] is a 81 y.o. male who presents emergency department with altered mental status.  History is given by EMS, the patient and his son at bedside.  The son reports that the patient was complaining that he did not feel well today.  Patient reports that he was having some shaking chills.  Son was at a baby shower earlier and on his way back called his dad but did not get any response so went to the home.  He found him sitting on the side of the couch holding his head.  He kept asking his father was okay and he was very quiet.  He then asked his dad "do you know who I am?"  And the patient states that he did not.  He was otherwise very mute.  On arrival the patient is minimally responsive to questions.  No obvious neurologic complaints.  The son reports that he has been been diagnosed with a urinary tract infection recently is currently taking oral antibiotics and had a similar episode like this where he was confused did not know her he was about a month ago. ? ? ?Altered Mental Status ? ?  ? ?Home Medications ?Prior to Admission medications   ?Medication Sig Start Date End Date Taking? Authorizing Provider  ?acetaminophen (TYLENOL) 500 MG tablet Take 500 mg by mouth every 6 (six) hours as needed for moderate pain.    [provider]  ?aspirin EC 81 MG tablet Take 1 tablet (81 mg total) by mouth daily. 03/29/17   Jettie Booze, MD  ?atorvastatin (LIPITOR) 10 MG tablet TAKE 1 TABLET(10 MG) BY MOUTH DAILY 07/19/20   Jettie Booze, MD  ?cephALEXin (KEFLEX) 500 MG capsule Take 1 capsule (500 mg total) by mouth 2 (two) times daily. 04/30/21   Varney Biles, MD  ?dapagliflozin propanediol (FARXIGA) 10 MG TABS tablet Take 1 tablet (10 mg total) by mouth daily. 07/09/20   Jettie Booze, MD  ?furosemide (LASIX) 20 MG tablet Take 1 tablet (20 mg total) by mouth 2 (two) times daily. 04/08/21   Eppie Gibson, MD  ?metoprolol succinate (TOPROL-XL) 25 MG 24 hr tablet Take 1.5 tablets (37.5 mg total) by mouth at bedtime. 04/08/21   Eppie Gibson, MD  ?Misc Natural Products [facility]) CAPS Take 1 capsule by mouth in the morning and at bedtime.    [provider]  ?sacubitril-valsartan (ENTRESTO) 24-26 MG Take 1 tablet by mouth 2 (two) times daily. 05/04/21   Jettie Booze, MD  ?spironolactone (ALDACTONE) 25 MG tablet Take 1 tablet (25 mg total) by mouth at bedtime. 04/09/21   Eppie Gibson, MD  ?tamsulosin (FLOMAX) 0.4 MG CAPS capsule Take 1 capsule (0.4 mg total) by mouth daily. 04/30/21   Varney Biles, MD  ?   ? ?Allergies    ?Lactose intolerance (gi)   ? ?Review of Systems   ?Review of Systems ? ?Physical Exam ?Updated Vital Signs ?BP (!) 128/94   Pulse (!) 102   Temp 98.2 ?F (36.8 ?C) (Oral)   Resp (!) 21   SpO2 100%  ?Physical Exam ?Vitals and nursing note reviewed.  ?Constitutional:   ?   General: He is not in acute distress. ?   Appearance: He is well-developed. He  is not diaphoretic.  ?HENT:  ?   Head: Normocephalic and atraumatic.  ?Eyes:  ?   General: No scleral icterus. ?   Conjunctiva/sclera: Conjunctivae normal.  ?Cardiovascular:  ?   Rate and Rhythm: Normal rate and regular rhythm.  ?   Heart sounds: Normal heart sounds.  ?Pulmonary:  ?   Effort: Pulmonary effort is normal. No respiratory distress.  ?   Breath sounds: Normal breath sounds.  ?Abdominal:  ?   Palpations: Abdomen is soft.  ?   Tenderness: There is no abdominal tenderness.  ?Musculoskeletal:     ?   General: Swelling present.  ?   Cervical back: Normal range of motion and neck supple.  ?   Comments: Bilateral lower extremity pitting edema to the knee.  Ulcerations noted to the right lower extremity without evidence of active infection  ?Skin: ?   General: Skin is warm and dry.   ?Neurological:  ?   Mental Status: He is alert and oriented to person, place, and time.  ?   Cranial Nerves: No cranial nerve deficit.  ?   Sensory: No sensory deficit.  ?   Motor: No weakness.  ?Psychiatric:     ?   Behavior: Behavior normal.  ? ? ?ED Results / Procedures / Treatments   ?Labs ?(all labs ordered are listed, but only abnormal results are displayed) ?Labs Reviewed  ?CBC WITH DIFFERENTIAL/PLATELET - Abnormal; Notable for the following components:  ?    Result Value  ? MCH 25.4 (*)   ? RDW 22.0 (*)   ? Platelets 123 (*)   ? All other components within normal limits  ?CULTURE, BLOOD (ROUTINE X 2)  ?CULTURE, BLOOD (ROUTINE X 2)  ?URINE CULTURE  ?COMPREHENSIVE METABOLIC PANEL  ?URINALYSIS, ROUTINE W REFLEX MICROSCOPIC  ?AMMONIA  ?LACTIC ACID, PLASMA  ?CBG MONITORING, ED  ? ? ?EKG ?None ? ?Radiology ?No results found. ? ?Procedures ?Procedures  ? ? ?Medications Ordered in ED ?Medications - No data to display ? ?ED Course/ Medical Decision Making/ A&P ?Clinical Course as of 05/07/21 2310  ?Sat May 07, 2021  ?2232 WBC: 4.3 [AH]  ?2233 Creatinine(!): 1.65 ?Creatinine at baseline [AH]  ?[place] 1 View ?Visualized 1 view chest x-ray which shows no acute abnormality [AH]  ?[place] ?I visualized the head CT which also shows no acute finding [AH]  ?2233 EKG 12-Lead ?EKG appears to show normal sinus rhythm  however this is a bad tracing with significant artifact and we will get a repeat EKG [AH]  ?  ?Clinical Course User Index ?[AH] Margarita Mail, PA-C  ? ?                        ?Medical Decision Making ?81 year old male here with acute altered mental status. ?The differential diagnosis for AMS is extensive and includes, but is not limited to: drug overdose - opioids, alcohol, sedatives, antipsychotics, drug withdrawal, others; Metabolic: hypoxia, hypoglycemia, hyperglycemia, hypercalcemia, hypernatremia, hyponatremia, uremia, hepatic encephalopathy, hypothyroidism, hyperthyroidism,  vitamin B12 or thiamine deficiency, carbon monoxide poisoning, Wilson's disease, Lactic acidosis, DKA/HHOS; Infectious: meningitis, encephalitis, bacteremia/sepsis, urinary tract infection, pneumonia, neurosyphilis; Structural: Space-occupying lesion, (brain tumor, subdural hematoma, hydrocephalus,); Vascular: stroke, subarachnoid hemorrhage, coronary ischemia, hypertensive encephalopathy, CNS vasculitis, thrombotic thrombocytopenic purpura, disseminated intravascular coagulation, hyperviscosity; Psychiatric: Schizophrenia, depression; Other: Seizure, hypothermia, heat stroke, ICU psychosis, dementia -"sundowning." ? ? ?After evaluation the patient does not appear to have any acute intracranial abnormalities.  I have  very low suspicion for stroke.  He does not appear to have sepsis. ?Patient may be experiencing some sundowning.  I have high suspicion for metabolic encephalopathy although patient's urine is still pending. ? ?I reviewed and interpreted all labs and I visualized all images.  No acute findings at this time.  Patient will be admitted by Dr. Normand Sloop after discussion with [facility] hospitalist. ? ? ? ?Amount and/or Complexity of Data Reviewed ?Labs: ordered. Decision-making details documented in ED Course. ?Radiology: ordered and independent interpretation performed. Decision-making details documented in ED Course. ?ECG/medicine tests: ordered and independent interpretation performed. Decision-making details documented in ED Course. ? ?Risk ?Decision regarding hospitalization. ? ?Final Clinical Impression(s) / ED Diagnoses ?Final diagnoses:  ?None  ? ? ?Rx / DC Orders ?ED Discharge Orders   ? ? None  ? ?  ? ? ?  ?Margarita Mail, PA-C ?05/07/21 2345 ? ?  ?Sherwood Gambler, MD ?05/08/21 1536 ? ?

## 2021-05-08 DIAGNOSIS — N400 Enlarged prostate without lower urinary tract symptoms: Secondary | ICD-10-CM

## 2021-05-08 DIAGNOSIS — G9341 Metabolic encephalopathy: Secondary | ICD-10-CM | POA: Diagnosis not present

## 2021-05-08 DIAGNOSIS — E876 Hypokalemia: Secondary | ICD-10-CM

## 2021-05-08 DIAGNOSIS — R81 Glycosuria: Secondary | ICD-10-CM

## 2021-05-08 LAB — URINALYSIS, ROUTINE W REFLEX MICROSCOPIC
Bilirubin Urine: NEGATIVE
Glucose, UA: >=500 mg/dL — AB
Hgb urine dipstick: NEGATIVE
Ketones, ur: NEGATIVE mg/dL
Nitrite: NEGATIVE
Protein, ur: NEGATIVE mg/dL
Specific Gravity, Urine: 1.009 (ref 1.005–1.030)
pH: 5 (ref 5.0–8.0)

## 2021-05-08 LAB — BASIC METABOLIC PANEL
Anion gap: 11 (ref 5–15)
BUN: 19 mg/dL (ref 8–23)
CO2: 17 mmol/L — ABNORMAL LOW (ref 22–32)
Calcium: 8.5 mg/dL — ABNORMAL LOW (ref 8.9–10.3)
Chloride: 108 mmol/L (ref 98–111)
Creatinine, Ser: 1.53 mg/dL — ABNORMAL HIGH (ref 0.61–1.24)
GFR, Estimated: 46 mL/min — ABNORMAL LOW (ref >60)
Glucose, Bld: 117 mg/dL — ABNORMAL HIGH (ref 70–99)
Potassium: 4.5 mmol/L (ref 3.5–5.1)
Sodium: 136 mmol/L (ref 135–145)

## 2021-05-08 LAB — HEMOGLOBIN A1C
Hgb A1c MFr Bld: 5.8 % — ABNORMAL HIGH (ref 4.8–5.6)
Mean Plasma Glucose: 119.76 mg/dL

## 2021-05-08 LAB — MAGNESIUM: Magnesium: 1.7 mg/dL (ref 1.7–2.4)

## 2021-05-08 LAB — BRAIN NATRIURETIC PEPTIDE: B Natriuretic Peptide: 3896.3 pg/mL — ABNORMAL HIGH (ref 0.0–100.0)

## 2021-05-08 MED ORDER — ACETAMINOPHEN 650 MG RE SUPP: 650.0000 mg | Freq: Four times a day (QID) | RECTAL | Status: DC | PRN

## 2021-05-08 MED ORDER — ATORVASTATIN CALCIUM 10 MG PO TABS
20.0000 mg | ORAL_TABLET | Freq: Every day | ORAL | Status: DC
Start: 2021-05-08 — End: 2021-05-10
  Administered 2021-05-08 – 2021-05-10 (×3): 20 mg via ORAL
  Filled 2021-05-08 (×3): qty 2

## 2021-05-08 MED ORDER — SODIUM CHLORIDE 0.9 % IV SOLN: INTRAVENOUS | Status: AC

## 2021-05-08 MED ORDER — TRAZODONE HCL 50 MG PO TABS
25.0000 mg | ORAL_TABLET | Freq: Every evening | ORAL | Status: DC | PRN
  Filled 2021-05-08: qty 1

## 2021-05-08 MED ORDER — SODIUM CHLORIDE 0.9 % IV SOLN: INTRAVENOUS | Status: DC

## 2021-05-08 MED ORDER — MAGNESIUM HYDROXIDE 400 MG/5ML PO SUSP: 30.0000 mL | Freq: Every day | ORAL | Status: DC | PRN

## 2021-05-08 MED ORDER — ENOXAPARIN SODIUM 40 MG/0.4ML IJ SOSY
40.0000 mg | PREFILLED_SYRINGE | Freq: Every day | INTRAMUSCULAR | Status: DC
  Administered 2021-05-08 – 2021-05-10 (×3): 40 mg via SUBCUTANEOUS
  Filled 2021-05-08 (×3): qty 0.4

## 2021-05-08 MED ORDER — ACETAMINOPHEN 325 MG PO TABS
650.0000 mg | ORAL_TABLET | Freq: Four times a day (QID) | ORAL | Status: DC | PRN
Start: 2021-05-08 — End: 2021-05-10

## 2021-05-08 MED ORDER — POTASSIUM CHLORIDE CRYS ER 20 MEQ PO TBCR: 40.0000 meq | EXTENDED_RELEASE_TABLET | Freq: Once | ORAL | Status: DC

## 2021-05-08 MED ORDER — ONDANSETRON HCL 4 MG/2ML IJ SOLN
4.0000 mg | Freq: Four times a day (QID) | INTRAMUSCULAR | Status: DC | PRN
Start: 2021-05-08 — End: 2021-05-10

## 2021-05-08 MED ORDER — SACUBITRIL-VALSARTAN 24-26 MG PO TABS
1.0000 | ORAL_TABLET | Freq: Two times a day (BID) | ORAL | Status: DC
  Administered 2021-05-08 (×2): 1 via ORAL
  Filled 2021-05-08 (×2): qty 1

## 2021-05-08 MED ORDER — ONDANSETRON HCL 4 MG PO TABS: 4.0000 mg | ORAL_TABLET | Freq: Four times a day (QID) | ORAL | Status: DC | PRN

## 2021-05-08 MED ORDER — SPIRONOLACTONE 25 MG PO TABS
25.0000 mg | ORAL_TABLET | Freq: Every day | ORAL | Status: DC
Start: 2021-05-09 — End: 2021-05-10
  Administered 2021-05-09: 25 mg via ORAL
  Filled 2021-05-08: qty 1

## 2021-05-08 MED ORDER — ASPIRIN EC 81 MG PO TBEC
81.0000 mg | DELAYED_RELEASE_TABLET | Freq: Every day | ORAL | Status: DC
  Administered 2021-05-08 – 2021-05-10 (×3): 81 mg via ORAL
  Filled 2021-05-08 (×3): qty 1

## 2021-05-08 MED ORDER — SPIRONOLACTONE 25 MG PO TABS
25.0000 mg | ORAL_TABLET | Freq: Every day | ORAL | Status: DC
Start: 2021-05-08 — End: 2021-05-08
  Administered 2021-05-08: 25 mg via ORAL
  Filled 2021-05-08: qty 1

## 2021-05-08 MED ORDER — SACUBITRIL-VALSARTAN 24-26 MG PO TABS
1.0000 | ORAL_TABLET | Freq: Two times a day (BID) | ORAL | Status: DC
  Administered 2021-05-09 – 2021-05-10 (×3): 1 via ORAL
  Filled 2021-05-08 (×4): qty 1

## 2021-05-08 MED ORDER — DAPAGLIFLOZIN PROPANEDIOL 10 MG PO TABS
10.0000 mg | ORAL_TABLET | Freq: Every day | ORAL | Status: DC
  Administered 2021-05-08 – 2021-05-10 (×3): 10 mg via ORAL
  Filled 2021-05-08 (×3): qty 1

## 2021-05-08 MED ORDER — TAMSULOSIN HCL 0.4 MG PO CAPS
0.4000 mg | ORAL_CAPSULE | Freq: Every day | ORAL | Status: DC
  Administered 2021-05-08 – 2021-05-10 (×3): 0.4 mg via ORAL
  Filled 2021-05-08 (×3): qty 1

## 2021-05-08 MED ORDER — MAGNESIUM SULFATE 2 GM/50ML IV SOLN
2.0000 g | Freq: Once | INTRAVENOUS | Status: AC
  Administered 2021-05-08: 2 g via INTRAVENOUS
  Filled 2021-05-08: qty 50

## 2021-05-08 NOTE — Assessment & Plan Note (Signed)
We will continue [place] as well as Aldactone. ?

## 2021-05-08 NOTE — Assessment & Plan Note (Signed)
-   We will continue his Flomax. 

## 2021-05-08 NOTE — H&P (Addendum)
?  ?  ?[facility] ? ? ?PATIENT NAME: Matthew [name]   ? ?MR#:  [id number] ? ?DATE OF BIRTH:  [birth year] ? ?DATE OF ADMISSION:  05/07/2021 ? ?PRIMARY CARE PHYSICIAN: Pcp, No  ? ?Patient is coming from: Home ? ?REQUESTING/REFERRING PHYSICIAN: Margarita Mail, PA-C  ? ?CHIEF COMPLAINT:  ? ?Chief Complaint  ?Patient presents with  ? Altered Mental Status  ? ? ?HISTORY OF PRESENT ILLNESS:  ?Matthew [name] is a 81 y.o. African-American male with medical history significant for coronary artery disease, cardiomyopathy, CHF, hypertension and osteoarthritis, presented to emergency room with acute onset of altered mental status as well as shaking chills.  He was recently treated for UTI with several days of p.o. Levaquin.  He admits urinary frequency and dysuria but denies any hematuria or flank pain.  He has been having mild dyspnea without cough or wheezing or hemoptysis.  No chest pain or palpitations.  No nausea or vomiting or abdominal pain.  He denies any paresthesias or focal muscle weakness. ? ?ED Course: Upon presenting to the emergency room, BP was 129/106 with temperature of 97.4 and heart rate of 101.  CMP revealed a potassium of 3.4 with CO2 of 21 and creatinine 1.65 close to baseline, albumin of 3.3 and total bili 1.6.  CBC showed platelets of 123 down from 173 a week ago.  UA came back with more than 500 glucose, trace leukocytes with rare bacteria and hyaline casts. ?EKG as reviewed by me : EKG showed sinus tachycardia with rate of 94 with PVCs and nonspecific intraventricular conduction delay ?Imaging: Portable chest ray showed no acute cardiopulmonary disease. ? ?The patient was given 1 g of p.o. Tylenol. He will be admitted to a medical telemetry bed for further evaluation and management ? ? ?PAST MEDICAL HISTORY:  ? ?Past Medical History:  ?Diagnosis Date  ? CAD (coronary artery disease)   ? Cardiomyopathy ([place])   ? Heart failure ([place])   ? HTN (hypertension)   ? Obesity   ? Osteoarthritis   ? ? ?PAST  SURGICAL HISTORY:  ? ?Past Surgical History:  ?Procedure Laterality Date  ? CHONDROPLASTY Left 01/11/2017  ? Procedure: CHONDROPLASTY of medial and patella compartment;  Surgeon: Dorna Leitz, MD;  Location: [facility];  Service: Orthopedics;  Laterality: Left;  ? KNEE ARTHROSCOPY Left 01/11/2017  ? Procedure: ARTHROSCOPIC IRRIGATION AND DEBRIDMENT KNEE;  Surgeon: Dorna Leitz, MD;  Location: [facility];  Service: Orthopedics;  Laterality: Left;  ? KNEE SURGERY    ? MENISECTOMY Left 01/11/2017  ? Procedure: Lateral MENISECTOMY;  Surgeon: Dorna Leitz, MD;  Location: [facility];  Service: Orthopedics;  Laterality: Left;  ? SYNOVECTOMY Left 01/11/2017  ? Procedure: Four compartment SYNOVECTOMY;  Surgeon: Dorna Leitz, MD;  Location: [facility];  Service: Orthopedics;  Laterality: Left;  ? ? ?SOCIAL HISTORY:  ? ?Social History  ? ?Tobacco Use  ? Smoking status: Never  ? Smokeless tobacco: Never  ?Substance Use Topics  ? Alcohol use: No  ? ? ?FAMILY HISTORY:  ? ?Family History  ?Problem Relation Age of Onset  ? Hypertension Father   ? Diabetes Brother   ? Heart attack Neg Hx   ? Stroke Neg Hx   ? ? ?DRUG ALLERGIES:  ? ?Allergies  ?Allergen Reactions  ? Lactose Intolerance (Gi)   ? ? ?REVIEW OF SYSTEMS:  ? ?ROS ?As per history of present illness. All pertinent systems were reviewed above. Constitutional, HEENT, cardiovascular, respiratory, GI, GU, musculoskeletal, neuro, psychiatric, endocrine, integumentary and hematologic systems  were reviewed and are otherwise negative/unremarkable except for positive findings mentioned above in the HPI. ? ? ?MEDICATIONS AT HOME:  ? ?Prior to Admission medications   ?Medication Sig Start Date End Date Taking? Authorizing Provider  ?acetaminophen (TYLENOL) 500 MG tablet Take 500 mg by mouth every 6 (six) hours as needed for moderate pain.    [provider]  ?aspirin EC 81 MG tablet Take 1 tablet (81 mg total) by mouth daily. 03/29/17   Jettie Booze, MD  ?atorvastatin (LIPITOR) 10 MG  tablet TAKE 1 TABLET(10 MG) BY MOUTH DAILY 07/19/20   Jettie Booze, MD  ?cephALEXin (KEFLEX) 500 MG capsule Take 1 capsule (500 mg total) by mouth 2 (two) times daily. 04/30/21   Varney Biles, MD  ?dapagliflozin propanediol (FARXIGA) 10 MG TABS tablet Take 1 tablet (10 mg total) by mouth daily. 07/09/20   Jettie Booze, MD  ?furosemide (LASIX) 20 MG tablet Take 1 tablet (20 mg total) by mouth 2 (two) times daily. 04/08/21   Eppie Gibson, MD  ?metoprolol succinate (TOPROL-XL) 25 MG 24 hr tablet Take 1.5 tablets (37.5 mg total) by mouth at bedtime. 04/08/21   Eppie Gibson, MD  ?Misc Natural Products [facility]) CAPS Take 1 capsule by mouth in the morning and at bedtime.    [provider]  ?sacubitril-valsartan (ENTRESTO) 24-26 MG Take 1 tablet by mouth 2 (two) times daily. 05/04/21   Jettie Booze, MD  ?spironolactone (ALDACTONE) 25 MG tablet Take 1 tablet (25 mg total) by mouth at bedtime. 04/09/21   Eppie Gibson, MD  ?tamsulosin (FLOMAX) 0.4 MG CAPS capsule Take 1 capsule (0.4 mg total) by mouth daily. 04/30/21   Varney Biles, MD  ? ?  ? ?VITAL SIGNS:  ?Blood pressure 100/74, pulse 88, temperature 97.7 ?F (36.5 ?C), temperature source Rectal, resp. rate 19, SpO2 98 %. ? ?PHYSICAL EXAMINATION:  ?Physical Exam ? ?GENERAL:  81 y.o.-year-old African-American patient lying in the bed with no acute distress.  ?EYES: Pupils equal, round, reactive to light and accommodation. No scleral icterus. Extraocular muscles intact.  ?HEENT: Head atraumatic, normocephalic. Oropharynx and nasopharynx clear.  ?NECK:  Supple, no jugular venous distention. No thyroid enlargement, no tenderness.  ?LUNGS: Normal breath sounds bilaterally, no wheezing, rales,rhonchi or crepitation. No use of accessory muscles of respiration.  ?CARDIOVASCULAR: Regular rate and rhythm, S1, S2 normal. No murmurs, rubs, or gallops.  ?ABDOMEN: Soft, nondistended, nontender. Bowel sounds present. No organomegaly or  mass.  ?EXTREMITIES: 2-3+ bilateral lower extremity pitting edema, with no cyanosis, or clubbing.  ?NEUROLOGIC: Cranial nerves II through XII are intact. Muscle strength 5/5 in all extremities. Sensation intact. Gait not checked.  ?PSYCHIATRIC: The patient is alert and oriented x 3.  Normal affect and good eye contact. ?SKIN: Right clean healing lower leg ulcer with no erythema ? ? ?LABORATORY PANEL:  ? ?CBC ?Recent Labs  ?Lab 05/07/21 ?2130  ?WBC 4.3  ?HGB 13.5  ?HCT 42.9  ?PLT 123*  ? ?------------------------------------------------------------------------------------------------------------------ ? ?Chemistries  ?Recent Labs  ?Lab 05/07/21 ?2130  ?NA 135  ?K 3.4*  ?CL 104  ?CO2 21*  ?GLUCOSE 92  ?BUN 19  ?CREATININE 1.65*  ?CALCIUM 8.9  ?AST 19  ?ALT 16  ?ALKPHOS 111  ?BILITOT 1.6*  ? ?------------------------------------------------------------------------------------------------------------------ ? ?Cardiac Enzymes ?No results for input(s): TROPONINI in the last 168 hours. ?------------------------------------------------------------------------------------------------------------------ ? ?RADIOLOGY:  ?DG Chest 1 View ? ?Result Date: 05/07/2021 ?CLINICAL DATA:  Altered mental status EXAM: CHEST  1 VIEW COMPARISON:  04/30/2021 FINDINGS: Check shadow is enlarged. Aortic calcifications are noted. The lungs are well aerated bilaterally. No focal infiltrate or sizable effusion is seen. No bony abnormality is noted. IMPRESSION: No active disease. Electronically Signed   By: Inez Catalina M.D.   On: 05/07/2021 22:19  ? ?CT HEAD WO CONTRAST ? ?Result Date: 05/07/2021 ?CLINICAL DATA:  Altered mental status EXAM: CT HEAD WITHOUT CONTRAST TECHNIQUE: Contiguous axial images were obtained from the base of the skull through the vertex without intravenous contrast. RADIATION DOSE REDUCTION: This exam was performed according to the departmental dose-optimization program which includes automated exposure control, adjustment of  the mA and/or kV according to patient size and/or use of iterative reconstruction technique. COMPARISON:  04/30/2021 FINDINGS: Brain: No evidence of acute infarction, hemorrhage, hydrocephalus, extra-axial collecti

## 2021-05-08 NOTE — Assessment & Plan Note (Signed)
We will check hemoglobin A1c 

## 2021-05-08 NOTE — Assessment & Plan Note (Signed)
-   Potassium will be replaced and will check magnesium level. ?

## 2021-05-08 NOTE — Evaluation (Signed)
Physical Therapy Evaluation ?Patient Details ?Name: Matthew [name] ?MRN: [id number] ?DOB: [birth year] ?Today's Date: 05/08/2021 ? ?History of Present Illness ? 81 y.o. male presents to [facility] hospital on [facility]/07/2021 with AMS and chills. Pt recently treated for UTI. Admitted for management of acute metabolic encephalopathy. PMH includes CAD, cardiomyopathy, CHF, HTN, OA.  ?Clinical Impression ? Pt presents to PT with deficits in functional mobility, strength, power, ambulation. Pt refuses out of bed mobility attempts this session, only sitting at the edge of the bed to pee and then returning to supine. Pt does not express why he refuses out of bed mobility, but is unwilling to participate. Pt reports he ambulates independently at home with use of a rollator, expressing no difficulty in ambulation prior to this admission. Pt will benefit from further PT assessment of mobility to aide in providing appropriate discharge recommendations. PT will continue to follow.   ?   ? ?Recommendations for follow up therapy are one component of a multi-disciplinary discharge planning process, led by the attending physician.  Recommendations may be updated based on patient status, additional functional criteria and insurance authorization. ? ?Follow Up Recommendations Home health PT (will need to further assess as pt refuses out of bed mobility) ? ?  ?Assistance Recommended at Discharge Frequent or constant Supervision/Assistance  ?Patient can return home with the following ? A lot of help with walking and/or transfers;A lot of help with bathing/dressing/bathroom;Assistance with cooking/housework;Assist for transportation;Help with stairs or ramp for entrance ? ?  ?Equipment Recommendations Wheelchair (measurements PT);Wheelchair cushion (measurements PT)  ?Recommendations for Other Services ?    ?  ?Functional Status Assessment Patient has had a recent decline in their functional status and demonstrates the ability to make significant  improvements in function in a reasonable and predictable amount of time.  ? ?  ?Precautions / Restrictions Precautions ?Precautions: Fall ?Restrictions ?Weight Bearing Restrictions: No  ? ?  ? ?Mobility ? Bed Mobility ?Overal bed mobility: Needs Assistance ?Bed Mobility: Supine to Sit, Sit to Supine ?  ?  ?Supine to sit: Supervision ?Sit to supine: Min assist ?  ?General bed mobility comments: assist for LE management when returning to bed ?  ? ?Transfers ?Overall transfer level:  (pt refuses) ?  ?  ?  ?  ?  ?  ?  ?  ?  ?  ? ?Ambulation/Gait ?  ?  ?  ?  ?  ?  ?  ?  ? ?Stairs ?  ?  ?  ?  ?  ? ?Wheelchair Mobility ?  ? ?Modified Rankin (Stroke Patients Only) ?  ? ?  ? ?Balance Overall balance assessment: Needs assistance ?Sitting-balance support: Single extremity supported, Feet supported, No upper extremity supported ?Sitting balance-[name] Scale: Fair ?  ?Postural control: Posterior lean ?Standing balance support:  (pt refuses attempts at standing) ?  ?  ?  ?  ?  ?  ?  ?  ?  ?  ?  ?  ?  ?   ? ? ? ?Pertinent Vitals/Pain Pain Assessment ?Pain Assessment: No/denies pain  ? ? ?Home Living Family/patient expects to be discharged to:: Private residence ?Living Arrangements: Children;Other (Comment) (daughter, son-in-law) ?Available Help at Discharge: Family;Available 24 hours/day ?Type of Home: House ?Home Access: Ramped entrance ?  ?  ?  ?Home Layout: One level ?Home Equipment: Rollator (4 wheels);BSC/3in1;Shower seat ?   ?  ?Prior Function Prior Level of Function : Independent/Modified Independent ?  ?  ?  ?  ?  ?  ?  Mobility Comments: pt reports his family assists with cleaning ?  ?  ? ? ?Hand Dominance  ? Dominant Hand: Right ? ?  ?Extremity/Trunk Assessment  ? Upper Extremity Assessment ?Upper Extremity Assessment: Generalized weakness ?  ? ?Lower Extremity Assessment ?Lower Extremity Assessment: Generalized weakness ?  ? ?Cervical / Trunk Assessment ?Cervical / Trunk Assessment: Kyphotic  ?Communication  ?  Communication: HOH  ?Cognition Arousal/Alertness: Awake/alert ?Behavior During Therapy: [facility] for tasks assessed/performed ?Overall Cognitive Status: No family/caregiver present to determine baseline cognitive functioning ?  ?  ?  ?  ?  ?  ?  ?  ?  ?  ?  ?  ?  ?  ?  ?  ?General Comments: pt is alert and oriented, confused about use of catheter. Pt follows commands well but refuses out of bed mobility ?  ?  ? ?  ?General Comments General comments (skin integrity, edema, etc.): pt tachy into 110s with bed mobility, PT notes multiple BLE wounds ? ?  ?Exercises    ? ?Assessment/Plan  ?  ?PT Assessment Patient needs continued PT services  ?PT Problem List Decreased strength;Decreased activity tolerance;Decreased balance;Decreased mobility ? ?   ?  ?PT Treatment Interventions DME instruction;Gait training;Functional mobility training;Balance training;Therapeutic activities;Therapeutic exercise;Neuromuscular re-education;Patient/family education;Wheelchair mobility training   ? ?PT Goals (Current goals can be found in the Care Plan section)  ?Acute Rehab PT Goals ?Patient Stated Goal: pt does not state, PT goal to return to ambulation and prior level of function ?PT Goal Formulation: With patient ?Time For Goal Achievement: 05/22/21 ?Potential to Achieve Goals: Fair ? ?  ?Frequency Min 3X/week ?  ? ? ?Co-evaluation   ?  ?  ?  ?  ? ? ?  ?AM-PAC PT "6 Clicks" Mobility  ?Outcome Measure Help needed turning from your back to your side while in a flat bed without using bedrails?: A Little ?Help needed moving from lying on your back to sitting on the side of a flat bed without using bedrails?: A Little ?Help needed moving to and from a bed to a chair (including a wheelchair)?: Total ?Help needed standing up from a chair using your arms (e.g., wheelchair or bedside chair)?: Total ?Help needed to walk in hospital room?: Total ?Help needed climbing 3-5 steps with a railing? : Total ?6 Click Score: 10 ? ?  ?End of Session   ?Activity  Tolerance: Other (comment) (pt refusing out of bed mobility, self-limiting) ?Patient left: in bed;with call bell/phone within reach;with bed alarm set ?Nurse Communication: Mobility status ?PT Visit Diagnosis: Other abnormalities of gait and mobility (R26.89);Muscle weakness (generalized) (M62.81) ?  ? ?Time: [id number] ?PT Time Calculation (min) (ACUTE ONLY): 16 min ? ? ?Charges:   PT Evaluation ?$PT Eval Low Complexity: 1 Low ?  ?  ?   ? ? ?Zenaida Niece, PT, DPT ?Acute Rehabilitation ?Pager: [phone] ?Office [phone] ? ? ?Zenaida Niece ?05/08/2021, 4:57 PM ? ?

## 2021-05-08 NOTE — Assessment & Plan Note (Addendum)
-   This is of unclear etiology. ?- The patient will be admitted to a medical telemetry bed. ?- We will place him on hydration with IV normal saline with potassium replacement. ?- We will follow neurochecks every 4 hours for 24 hours. ?- We will obtain a urine culture and sensitivity. ?

## 2021-05-08 NOTE — Progress Notes (Signed)
?                                  PROGRESS NOTE                                             ?                                                                                                                     ?                                         ? ? Patient Demographics:  ? ? Matthew [name], is a 81 y.o. male, DOB - September 24, 1940, [id number] ? ?Outpatient Primary MD for the patient is Pcp, No    LOS - 1  Admit date - 05/07/2021   ? ?Chief Complaint  ?Patient presents with  ? Altered Mental Status  ?    ? ?Brief Narrative (HPI from H&P) - 81 y.o. African-American male with medical history significant for coronary artery disease, cardiomyopathy, CHF, hypertension and osteoarthritis, presented to emergency room with acute onset of altered mental status as well as shaking chills.  He was recently treated for UTI with several days of p.o. Levaquin, ER he was diagnosed with dehydration likely due to Aldactone and admitted to the hospital for metabolic encephalopathy ? ? Subjective:  ? ? Matthew [name] today has, No headache, No chest pain, No abdominal pain - No Nausea, No new weakness tingling or numbness, no SOB. ? ? Assessment  & Plan :  ? ? ?Metabolic encephalopathy - most likely caused by dehydration in the setting of recent UTI and being on Aldactone, with gentle hydration with IV fluids is much better, head CT is nonacute, he has no headache or focal deficits, mental status close to baseline now.  Continue gentle hydration for another 8 hours, skip Aldactone tonight, advance activity, PT OT and likely discharge in the next few days. ? ?2.  Chronic heart failure EF around 25%.  Currently dehydrated.  Continue Entresto and Farxiga, skip Aldactone tonight.  Cardiology follow-up post discharge. ? ?3.  BPH.  On Flomax. ? ?4.  Hypokalemia.  Replaced.  Monitor. ? ?5.  Dyslipidemia.  On statin. ? ?   ? ?Condition - Fair ? ?Family Communication  :   [name] [phone] on  05/08/2021 ? ?Code Status :  Full ? ?Consults  :  None ? ?PUD Prophylaxis :  ? ? Procedures  :    ? ?CT head  - non acute ? ?   ? ?Disposition Plan  :   ? ?Status is: Inpatient ? ?DVT Prophylaxis  :   ? ?enoxaparin (LOVENOX) injection 40  mg Start: 05/08/21 1000 ? ?Lab Results  ?Component Value Date  ? PLT 123 (L) 05/07/2021  ? ? ?Diet :  ?Diet Order   ? ?       ?  Diet Heart Room service appropriate? Yes; Fluid consistency: Thin  Diet effective now       ?  ? ?  ?  ? ?  ?  ? ?Inpatient Medications ? ?Scheduled Meds: ? aspirin EC  81 mg Oral Daily  ? atorvastatin  20 mg Oral Daily  ? dapagliflozin propanediol  10 mg Oral Daily  ? enoxaparin (LOVENOX) injection  40 mg Subcutaneous Daily  ? sacubitril-valsartan  1 tablet Oral BID  ? [START ON 05/09/2021] spironolactone  25 mg Oral QHS  ? tamsulosin  0.4 mg Oral Daily  ? ?Continuous Infusions: ? sodium chloride    ? ?PRN Meds:.acetaminophen **OR** acetaminophen, magnesium hydroxide, ondansetron **OR** ondansetron (ZOFRAN) IV, traZODone ? ?Antibiotics  :   ? ?Anti-infectives (From admission, onward)  ? ? None  ? ?  ? ? ? Time Spent in minutes  30 ? ? ?Lala Lund M.D on 05/08/2021 at 9:23 AM ? ?To page go to [email or web address]  ? ?[facility] -  Office  [phone] ? ?See all Orders from today for further details ? ? ? Objective:  ? ?Vitals:  ? 05/07/21 2308 05/08/21 0330 05/08/21 0630 05/08/21 0840  ?BP:  106/90 (!) 121/94 110/78  ?Pulse:  87 96 93  ?Resp:  18 19 16   ?Temp: 97.7 ?F (36.5 ?C)  97.7 ?F (36.5 ?C) 98.1 ?F (36.7 ?C)  ?TempSrc: Rectal  Oral Oral  ?SpO2:  98% 99% 98%  ?Weight:   100.3 kg   ?Height:   6\' 7"  (2.007 m)   ? ? ?Wt Readings from Last 3 Encounters:  ?05/08/21 100.3 kg  ?04/08/21 103.5 kg  ?09/02/20 104.8 kg  ? ? ?No intake or output data in the 24 hours ending 05/08/21 0923 ? ? ?Physical Exam ? ?Awake Alert, No new F.N deficits, Normal affect ?[place].AT,PERRAL ?Supple Neck, No JVD,   ?Symmetrical Chest wall movement, Good air movement bilaterally,  CTAB ?RRR,No Gallops,Rubs or new Murmurs,  ?+ve B.Sounds, Abd Soft, No tenderness,   ?No Cyanosis, Clubbing or edema  ?   ? ? Data Review:  ? ? ?CBC ?Recent Labs  ?Lab 05/07/21 ?2130  ?WBC 4.3  ?HGB 13.5  ?HCT 42.9  ?PLT 123*  ?MCV 80.8  ?MCH 25.4*  ?MCHC 31.5  ?RDW 22.0*  ?LYMPHSABS 1.2  ?MONOABS 0.5  ?EOSABS 0.0  ?BASOSABS 0.0  ? ? ?Electrolytes ?Recent Labs  ?Lab 05/07/21 ?2130  ?NA 135  ?K 3.4*  ?CL 104  ?CO2 21*  ?GLUCOSE 92  ?BUN 19  ?CREATININE 1.65*  ?CALCIUM 8.9  ?AST 19  ?ALT 16  ?ALKPHOS 111  ?BILITOT 1.6*  ?ALBUMIN 3.3*  ?LATICACIDVEN 1.6  ?HGBA1C 5.8*  ?AMMONIA 34  ? ? ?------------------------------------------------------------------------------------------------------------------ ?No results for input(s): CHOL, HDL, LDLCALC, TRIG, CHOLHDL, LDLDIRECT in the last 72 hours. ? ?Lab Results  ?Component Value Date  ? HGBA1C 5.8 (H) 05/07/2021  ? ? ?No results for input(s): TSH, T4TOTAL, T3FREE, THYROIDAB in the last 72 hours. ? ?Invalid input(s): FREET3 ?------------------------------------------------------------------------------------------------------------------ ?ID Labs ?Recent Labs  ?Lab 05/07/21 ?2130  ?WBC 4.3  ?PLT 123*  ?LATICACIDVEN 1.6  ?CREATININE 1.65*  ? ?Cardiac Enzymes ?No results for input(s): CKMB, TROPONINI, MYOGLOBIN in the last 168 hours. ? ?Invalid input(s): CK ? ?Radiology Reports ?DG Chest 1 View ? ?Result Date:  05/07/2021 ?CLINICAL DATA:  Altered mental status EXAM: CHEST  1 VIEW COMPARISON:  04/30/2021 FINDINGS: Check shadow is enlarged. Aortic calcifications are noted. The lungs are well aerated bilaterally. No focal infiltrate or sizable effusion is seen. No bony abnormality is noted. IMPRESSION: No active disease. Electronically Signed   By: Inez Catalina M.D.   On: 05/07/2021 22:19  ? ?CT HEAD WO CONTRAST ? ?Result Date: 05/07/2021 ?CLINICAL DATA:  Altered mental status EXAM: CT HEAD WITHOUT CONTRAST TECHNIQUE: Contiguous axial images were obtained from the base of the skull  through the vertex without intravenous contrast. RADIATION DOSE REDUCTION: This exam was performed according to the departmental dose-optimization program which includes automated exposure control, adjustment of the mA and/or kV according to patient size and/or use of iterative reconstruction technique. COMPARISON:  04/30/2021 FINDINGS: Brain: No evidence of acute infarction, hemorrhage, hydrocephalus, extra-axial collection or mass lesion/mass effect. Mild atrophic changes and chronic white matter ischemic changes are seen. Vascular: No hyperdense vessel or unexpected calcification. Skull: Normal. Negative for fracture or focal lesion. Sinuses/Orbits: Mucosal thickening is noted within the left maxillary antrum. This is increased in the interval from the prior exam. Opacification of the left frontal sinus and portions the left ethmoid sinuses are noted chronic in nature. Other: None. IMPRESSION: Chronic atrophic and ischemic changes. Chronic sinus changes as described. Electronically Signed   By: Inez Catalina M.D.   On: 05/07/2021 22:17    ? ? ?

## 2021-05-08 NOTE — Progress Notes (Signed)
Patient  had 8 beats of VT.Marland Kitchen He was asymptomatic on reassessment, Dr. Margo Aye notified without any new orders.  ?

## 2021-05-09 DIAGNOSIS — G9341 Metabolic encephalopathy: Secondary | ICD-10-CM | POA: Diagnosis not present

## 2021-05-09 LAB — CBC WITH DIFFERENTIAL/PLATELET
Abs Immature Granulocytes: 0.01 10*3/uL (ref 0.00–0.07)
Basophils Absolute: 0 10*3/uL (ref 0.0–0.1)
Basophils Relative: 1 %
Eosinophils Absolute: 0 10*3/uL (ref 0.0–0.5)
Eosinophils Relative: 1 %
HCT: 36.5 % — ABNORMAL LOW (ref 39.0–52.0)
Hemoglobin: 12 g/dL — ABNORMAL LOW (ref 13.0–17.0)
Immature Granulocytes: 0 %
Lymphocytes Relative: 23 %
Lymphs Abs: 1.1 10*3/uL (ref 0.7–4.0)
MCH: 25.8 pg — ABNORMAL LOW (ref 26.0–34.0)
MCHC: 32.9 g/dL (ref 30.0–36.0)
MCV: 78.3 fL — ABNORMAL LOW (ref 80.0–100.0)
Monocytes Absolute: 0.5 10*3/uL (ref 0.1–1.0)
Monocytes Relative: 10 %
Neutro Abs: 3.2 10*3/uL (ref 1.7–7.7)
Neutrophils Relative %: 65 %
Platelets: 112 10*3/uL — ABNORMAL LOW (ref 150–400)
RBC: 4.66 MIL/uL (ref 4.22–5.81)
RDW: 21.6 % — ABNORMAL HIGH (ref 11.5–15.5)
WBC: 4.9 10*3/uL (ref 4.0–10.5)
nRBC: 0 % (ref 0.0–0.2)

## 2021-05-09 LAB — COMPREHENSIVE METABOLIC PANEL
ALT: 11 U/L (ref 0–44)
AST: 13 U/L — ABNORMAL LOW (ref 15–41)
Albumin: 2.8 g/dL — ABNORMAL LOW (ref 3.5–5.0)
Alkaline Phosphatase: 98 U/L (ref 38–126)
Anion gap: 8 (ref 5–15)
BUN: 16 mg/dL (ref 8–23)
CO2: 22 mmol/L (ref 22–32)
Calcium: 8.6 mg/dL — ABNORMAL LOW (ref 8.9–10.3)
Chloride: 107 mmol/L (ref 98–111)
Creatinine, Ser: 1.34 mg/dL — ABNORMAL HIGH (ref 0.61–1.24)
GFR, Estimated: 54 mL/min — ABNORMAL LOW (ref >60)
Glucose, Bld: 95 mg/dL (ref 70–99)
Potassium: 3.7 mmol/L (ref 3.5–5.1)
Sodium: 137 mmol/L (ref 135–145)
Total Bilirubin: 1.1 mg/dL (ref 0.3–1.2)
Total Protein: 6.4 g/dL — ABNORMAL LOW (ref 6.5–8.1)

## 2021-05-09 LAB — URINE CULTURE
Culture: NO GROWTH
Special Requests: NORMAL

## 2021-05-09 LAB — MAGNESIUM: Magnesium: 1.9 mg/dL (ref 1.7–2.4)

## 2021-05-09 LAB — BRAIN NATRIURETIC PEPTIDE: B Natriuretic Peptide: >4500 pg/mL — ABNORMAL HIGH (ref 0.0–100.0)

## 2021-05-09 MED ORDER — METOPROLOL TARTRATE 25 MG PO TABS
25.0000 mg | ORAL_TABLET | Freq: Two times a day (BID) | ORAL | Status: DC
Start: 2021-05-09 — End: 2021-05-10
  Administered 2021-05-09 (×2): 25 mg via ORAL
  Filled 2021-05-09 (×2): qty 1

## 2021-05-09 MED ORDER — FUROSEMIDE 40 MG PO TABS
40.0000 mg | ORAL_TABLET | Freq: Once | ORAL | Status: AC
  Administered 2021-05-09: 40 mg via ORAL
  Filled 2021-05-09: qty 1

## 2021-05-09 MED ORDER — POTASSIUM CHLORIDE CRYS ER 20 MEQ PO TBCR
40.0000 meq | EXTENDED_RELEASE_TABLET | Freq: Once | ORAL | Status: AC
  Administered 2021-05-09: 40 meq via ORAL
  Filled 2021-05-09: qty 2

## 2021-05-09 MED ORDER — MAGNESIUM SULFATE IN D5W 1-5 GM/100ML-% IV SOLN
1.0000 g | Freq: Once | INTRAVENOUS | Status: AC
  Administered 2021-05-09: 1 g via INTRAVENOUS
  Filled 2021-05-09: qty 100

## 2021-05-09 MED ORDER — MAGNESIUM OXIDE -MG SUPPLEMENT 400 (240 MG) MG PO TABS
800.0000 mg | ORAL_TABLET | ORAL | Status: AC
  Administered 2021-05-09: 800 mg via ORAL
  Filled 2021-05-09: qty 2

## 2021-05-09 NOTE — Progress Notes (Signed)
Patient's is having multiple PVCs and frequent VTs. He;s asymptomatic though. Notified Dr. Margo Aye and she asked to check his HR and BP. She also ordered Magnesium oral. Orders already implemented.  MD stated we should continue to monitor. ?

## 2021-05-09 NOTE — Progress Notes (Addendum)
?  Transition of Care (TOC) Screening Note ? ? ?Patient Details  ?Name: Matthew [name] ?Date of Birth: [birth year] ? ? ?Transition of Care (TOC) CM/SW Contact:    ?Mearl Latin, LCSW ?Phone Number: ?05/09/2021, 8:58 AM ? ? ? ?Transition of Care Department [facility]) has reviewed patient and no TOC needs have been identified at this time. Patient is active with home health services and will require resumption orders at discharge. We will continue to monitor patient advancement through interdisciplinary progression rounds. If new patient transition needs arise, please place a TOC consult. ? ? ?

## 2021-05-09 NOTE — TOC Initial Note (Signed)
Transition of Care (TOC) - Initial/Assessment Note  ? ? ?Patient Details  ?Name: Matthew [name] ?MRN: [id number] ?Date of Birth: 02/08/1941 ? ?Transition of Care (TOC) CM/SW Contact:    ?Harriet Masson, RN ?Phone Number: ?05/09/2021, 3:08 PM ? ?Clinical Narrative:                 ?Patient is an active patient of [facility] for HH-PT/OT/RN. Patient lives with daughter. ? ?Need resumption Home health orders for RN PT OT. ? ? ?Expected Discharge Plan: Home w Home Health Services ?Barriers to Discharge: Continued Medical Work up ? ? ?Patient Goals and CMS Choice ?  ?  ?  ? ?Expected Discharge Plan and Services ?Expected Discharge Plan: Home w Home Health Services ?  ?  ?  ?  ?Expected Discharge Date: 05/09/21               ?  ?  ?  ?  ?  ?  ?  ?  ?  ?  ? ?Prior Living Arrangements/Services ?  ?  ?  ?       ?Need for Family Participation in Patient Care: Yes (Comment) ?Care giver support system in place?: Yes (comment) ?Current home services: Home OT, Home PT, Home RN ?Criminal Activity/Legal Involvement Pertinent to Current Situation/Hospitalization: No - Comment as needed ? ?Activities of Daily Living ?  ?ADL Screening (condition at time of admission) ?Patient's cognitive ability adequate to safely complete daily activities?: No ?Is the patient deaf or have difficulty hearing?: No ?Does the patient have difficulty seeing, even when wearing glasses/contacts?: No ?Does the patient have difficulty concentrating, remembering, or making decisions?: Yes ?Patient able to express need for assistance with ADLs?: Yes ?Does the patient have difficulty dressing or bathing?: Yes ?Independently performs ADLs?: No ?Does the patient have difficulty walking or climbing stairs?: Yes ?Weakness of Legs: Both ?Weakness of Arms/Hands: None ? ?Permission Sought/Granted ?  ?  ?   ?   ?   ?   ? ?Emotional Assessment ?  ?  ?  ?  ?Alcohol / Substance Use: Not Applicable ?Psych Involvement: No (comment) ? ?Admission diagnosis:  Altered mental  status, unspecified altered mental status type [R41.82] ?Acute metabolic encephalopathy [G93.41] ?Patient Active Problem List  ? Diagnosis Date Noted  ? Hypokalemia 05/08/2021  ? BPH (benign prostatic hyperplasia) 05/08/2021  ? Glucosuria 05/08/2021  ? Acute metabolic encephalopathy 05/07/2021  ? Acute exacerbation of CHF (congestive heart failure) (HCC) 04/04/2021  ? S/P PICC central line placement 01/30/2017  ? Medication monitoring encounter 01/30/2017  ? Normocytic anemia 01/12/2017  ? Septic arthritis of knee, left (HCC) 01/11/2017  ? Erectile dysfunction 12/08/2015  ? Bigeminy 06/11/2015  ? Congestive dilated cardiomyopathy (HCC) 12/11/2013  ? Cardiomyopathy (HCC) 01/07/2013  ? Obesity 01/07/2013  ? Coronary atherosclerosis of native coronary artery 01/07/2013  ? Mixed hyperlipidemia 01/07/2013  ? Essential hypertension, benign 01/07/2013  ? ?PCP:  Pcp, No ?Pharmacy:   ?[facility] [phone] - , [place] - [phone] AT [facility] [address] ?[address] ? [place] [address] ?Phone: [phone] Fax: [phone] ? ?[facility] ?[address] ?[place] [address] ?Phone: [phone] Fax: [phone] ? ?[facility] ?[address] ?[place] [address] ?Phone: [phone] Fax: [phone] ? ? ? ? ?Social Determinants of Health (SDOH) Interventions ?  ? ?Readmission Risk Interventions ?   ? View : No data to display.  ?  ?  ?  ? ? ? ?

## 2021-05-09 NOTE — Progress Notes (Signed)
Physical Therapy Treatment ?Patient Details ?Name: Matthew [name] ?MRN: [id number] ?DOB: Oct 17, 1940 ?Today's Date: 05/09/2021 ? ? ?History of Present Illness 81 y.o. male presents to [facility] hospital on 05/07/2021 with AMS and chills. Pt recently treated for UTI. Admitted for management of acute metabolic encephalopathy. PMH includes CAD, cardiomyopathy, CHF, HTN, OA. ? ?  ?PT Comments  ? ? Patient agreeable to OOB but did not want to ambulate farther than this. Walks with stooped posture (kyphotic with left knee flexed) x 3 feet to chair. Asking about his breakfast and nursing made aware. Marland Kitchenl   ?Recommendations for follow up therapy are one component of a multi-disciplinary discharge planning process, led by the attending physician.  Recommendations may be updated based on patient status, additional functional criteria and insurance authorization. ? ?Follow Up Recommendations ? Home health PT ?  ?  ?Assistance Recommended at Discharge Intermittent Supervision/Assistance  ?Patient can return home with the following Assistance with cooking/housework;Assist for transportation;Help with stairs or ramp for entrance;A little help with walking and/or transfers ?  ?Equipment Recommendations ? None recommended by PT  ?  ?Recommendations for Other Services   ? ? ?  ?Precautions / Restrictions Precautions ?Precautions: Fall ?Restrictions ?Weight Bearing Restrictions: No  ?  ? ?Mobility ? Bed Mobility ?Overal bed mobility: Needs Assistance ?Bed Mobility: Supine to Sit ?  ?  ?Supine to sit: Supervision ?  ?  ?General bed mobility comments: HOB elevated ~20 degrees, no use of rail ?  ? ?Transfers ?Overall transfer level: Needs assistance ?Equipment used: Rolling Able (2 wheels) ?Transfers: Sit to/from Stand, Bed to chair/wheelchair/BSC ?Sit to Stand: Supervision ?  ?Step pivot transfers: Min guard ?  ?  ?  ?General transfer comment: Pt able to stand from bed elevated to simulate home environment. Kyphotic posture, uses RW to  step-pivot to chair ?  ? ?Ambulation/Gait ?Ambulation/Gait assistance: Min guard ?Gait Distance (Feet): 4 Feet ?Assistive device: Rolling [place] (2 wheels) ?Gait Pattern/deviations: Step-to pattern, Trunk flexed ?  ?  ?  ?General Gait Details: Patient walks with left knee flexed and kyphotic posture. Refuses further ambulation ? ? ?Stairs ?  ?  ?  ?  ?  ? ? ?Wheelchair Mobility ?  ? ?Modified Rankin (Stroke Patients Only) ?  ? ? ?  ?Balance Overall balance assessment: Needs assistance ?Sitting-balance support: Feet supported, No upper extremity supported ?Sitting balance-[name] Scale: Fair ?  ?  ?Standing balance support: Bilateral upper extremity supported, Reliant on assistive device for balance ?Standing balance-[name] Scale: Poor ?  ?  ?  ?  ?  ?  ?  ?  ?  ?  ?  ?  ?  ? ?  ?Cognition Arousal/Alertness: Awake/alert ?Behavior During Therapy: [facility] for tasks assessed/performed ?Overall Cognitive Status: No family/caregiver present to determine baseline cognitive functioning ?  ?  ?  ?  ?  ?  ?  ?  ?  ?  ?  ?  ?  ?  ?  ?  ?General Comments: pt is alert and oriented ?  ?  ? ?  ?Exercises   ? ?  ?General Comments General comments (skin integrity, edema, etc.): Tachycardic to 110s with multiople PVCs ?  ?  ? ?Pertinent Vitals/Pain Pain Assessment ?Pain Assessment: No/denies pain  ? ? ?Home Living   ?  ?  ?  ?  ?  ?  ?  ?  ?  ?   ?  ?Prior Function    ?  ?  ?   ? ?  PT Goals (current goals can now be found in the care plan section) Acute Rehab PT Goals ?Patient Stated Goal: pt does not state, PT goal to return to ambulation and prior level of function ?Time For Goal Achievement: 05/22/21 ?Potential to Achieve Goals: Fair ?Progress towards PT goals: Progressing toward goals ? ?  ?Frequency ? ? ? Min 3X/week ? ? ? ?  ?PT Plan Current plan remains appropriate  ? ? ?Co-evaluation   ?  ?  ?  ?  ? ?  ?AM-PAC PT "6 Clicks" Mobility   ?Outcome Measure ? Help needed turning from your back to your side while in a flat bed without using  bedrails?: None ?Help needed moving from lying on your back to sitting on the side of a flat bed without using bedrails?: None ?Help needed moving to and from a bed to a chair (including a wheelchair)?: A Little ?Help needed standing up from a chair using your arms (e.g., wheelchair or bedside chair)?: A Little ?Help needed to walk in hospital room?: Total ?Help needed climbing 3-5 steps with a railing? : Total ?6 Click Score: 16 ? ?  ?End of Session   ?Activity Tolerance: Patient tolerated treatment well ?Patient left: with call bell/phone within reach;in chair;with chair alarm set ?Nurse Communication: Mobility status ?PT Visit Diagnosis: Other abnormalities of gait and mobility (R26.89);Muscle weakness (generalized) (M62.81) ?  ? ? ?Time: [id number] ?PT Time Calculation (min) (ACUTE ONLY): 19 min ? ?Charges:  $Gait Training: 8-22 mins          ?          ? ? ?Arby Barrette, PT ?Acute Rehabilitation Services  ?Pager [phone] ?Office [phone] ? ? ? ?Jeanie Cooks Emmerich Cryer ?05/09/2021, 10:26 AM ? ?

## 2021-05-09 NOTE — Evaluation (Signed)
Occupational Therapy Evaluation ?Patient Details ?Name: [name] ?MRN: [id number] ?DOB: 05-01-1940 ?Today's Date: 05/09/2021 ? ? ?History of Present Illness 81 y.o. male presents to [facility] hospital on 05/07/2021 with AMS and chills. Pt recently treated for UTI. Admitted for management of acute metabolic encephalopathy. PMH includes CAD, cardiomyopathy, CHF, HTN, OA.  ? ?Clinical Impression ?  ?Pt admitted with the above diagnoses and presents with below problem list. Pt will benefit from continued acute OT to address the below listed deficits and maximize independence with basic ADLs prior to d/c home pending progress. At baseline, pt was mod I with basic ADLs, used rollator. Per chart review pt lives with family and already receiving HH therapy. Pt currently declining EOB/OOB activity. Bed level assessment completed. Pt able to scoot self up in bed and come to at least partial long-sitting. Pt indicating he would like to be left alone and watch some TV. Question possible cognitive component to refusal? Will continue to assess next OT session.  ?  ?   ? ?Recommendations for follow up therapy are one component of a multi-disciplinary discharge planning process, led by the attending physician.  Recommendations may be updated based on patient status, additional functional criteria and insurance authorization.  ? ?Follow Up Recommendations ? Home health OT (pending progress)  ?  ?Assistance Recommended at Discharge Frequent or constant Supervision/Assistance  ?Patient can return home with the following A little help with walking and/or transfers;A little help with bathing/dressing/bathroom;Assistance with cooking/housework;Direct supervision/assist for financial management;Direct supervision/assist for medications management;Assist for transportation ? ?  ?Functional Status Assessment ? Patient has had a recent decline in their functional status and demonstrates the ability to make significant improvements in function  in a reasonable and predictable amount of time.  ?Equipment Recommendations ? None recommended by OT  ?  ?Recommendations for Other Services   ? ? ?  ?Precautions / Restrictions Precautions ?Precautions: Fall ?Restrictions ?Weight Bearing Restrictions: No  ? ?  ? ?Mobility Bed Mobility ?Overal bed mobility: Needs Assistance ?  ?  ?  ?  ?  ?  ?General bed mobility comments: pt came to longsitting position and able to scoot up in bed with no physical assist. Suspect he may need some assist to advance BLE and pivot hips to full EOB position ?  ? ?Transfers ?  ?  ?  ?  ?  ?  ?  ?  ?  ?General transfer comment: pt refused. per chart review pt able to take pivotal steps with +1 assist. [place] at baseline. ?  ? ?  ?Balance   ?  ?  ?  ?  ?  ?  ?  ?  ?  ?  ?  ?  ?  ?  ?  ?  ?  ?  ?   ? ?ADL either performed or assessed with clinical judgement  ? ?ADL Overall ADL's : Needs assistance/impaired ?Eating/Feeding: Set up ?Eating/Feeding Details (indicate cue type and reason): observed to drink from cup with setup provided ?Grooming: Set up ?  ?Upper Body Bathing: Set up;Min guard;Sitting ?  ?Lower Body Bathing: Min guard;Minimal assistance ?  ?Upper Body Dressing : Set up;Min guard ?  ?Lower Body Dressing: Min guard;Minimal assistance ?  ?  ?  ?  ?  ?  ?  ?  ?General ADL Comments: per clinical judgement. pt may struggle with cognitive aspect of ADLs. difficult to assess as he currently expresses no interest in working with therapy.  ? ? ? ?  Vision Baseline Vision/History: 1 Wears glasses ?   ?   ?Perception   ?  ?Praxis   ?  ? ?Pertinent Vitals/Pain Pain Assessment ?Pain Assessment: No/denies pain  ? ? ? ?Hand Dominance Right ?  ?Extremity/Trunk Assessment Upper Extremity Assessment ?Upper Extremity Assessment: Generalized weakness ?  ?Lower Extremity Assessment ?Lower Extremity Assessment: Defer to PT evaluation ?  ?Cervical / Trunk Assessment ?Cervical / Trunk Assessment: Kyphotic ?  ?Communication Communication ?Communication:  HOH ?  ?Cognition Arousal/Alertness: Awake/alert ?Behavior During Therapy: Flat affect (irritated) ?Overall Cognitive Status: No family/caregiver present to determine baseline cognitive functioning ?  ?  ?  ?  ?  ?  ?  ?  ?  ?  ?  ?  ?  ?  ?  ?  ?General Comments: pt is alert and oriented. ?  ?  ?General Comments   ? ?  ?Exercises   ?  ?Shoulder Instructions    ? ? ?Home Living Family/patient expects to be discharged to:: Private residence ?Living Arrangements: Children;Other (Comment) (daughter, son-in-law) ?Available Help at Discharge: Family;Available 24 hours/day ?Type of Home: House ?Home Access: Ramped entrance ?  ?  ?Home Layout: One level ?  ?  ?Bathroom Shower/Tub: Walk-in shower ?  ?Bathroom Toilet: Standard ?Bathroom Accessibility: Yes ?  ?Home Equipment: Rollator (4 wheels);BSC/3in1;Shower seat ?  ?  ?  ? ?  ?Prior Functioning/Environment Prior Level of Function : Independent/Modified Independent ?  ?  ?  ?  ?  ?  ?Mobility Comments: pt reports his family assists with cleaning ?ADLs Comments: Pt uses 4 wheeled RW at home, reports that he is Mod I bathing and dressing however, no family available to confirm. Family cooks/preps meals. ?  ? ?  ?  ?OT Problem List: Decreased strength;Decreased activity tolerance;Impaired balance (sitting and/or standing);Decreased cognition;Decreased safety awareness;Decreased knowledge of use of DME or AE;Decreased knowledge of precautions ?  ?   ?OT Treatment/Interventions: Self-care/ADL training;Therapeutic exercise;Energy conservation;Therapeutic activities;Cognitive remediation/compensation;Patient/family education;Balance training  ?  ?OT Goals(Current goals can be found in the care plan section) Acute Rehab OT Goals ?Patient Stated Goal: watch some TV, be left alone. ?OT Goal Formulation: With patient ?Time For Goal Achievement: 05/23/21 ?Potential to Achieve Goals: Fair ?ADL Goals ?Pt Will Perform Eating: with set-up;sitting ?Pt Will Perform Grooming: with  set-up;with supervision;sitting;standing ?Pt Will Perform Upper Body Dressing: with set-up;sitting ?Pt Will Transfer to Toilet: with supervision;ambulating ?Pt Will Perform Toileting - Clothing Manipulation and hygiene: with supervision;sit to/from stand  ?OT Frequency: Min 2X/week ?  ? ?Co-evaluation   ?  ?  ?  ?  ? ?  ?AM-PAC OT "6 Clicks" Daily Activity     ?Outcome Measure Help from another person eating meals?: A Little ?Help from another person taking care of personal grooming?: A Little ?Help from another person toileting, which includes using toliet, bedpan, or urinal?: A Little ?Help from another person bathing (including washing, rinsing, drying)?: A Little ?Help from another person to put on and taking off regular upper body clothing?: A Little ?Help from another person to put on and taking off regular lower body clothing?: A Little ?6 Click Score: 18 ?  ?End of Session   ? ?Activity Tolerance: Patient tolerated treatment well ?Patient left: in bed;with call bell/phone within reach;with bed alarm set ? ?OT Visit Diagnosis: Unsteadiness on feet (R26.81);Muscle weakness (generalized) (M62.81);Other symptoms and signs involving cognitive function  ?              ?Time: 9767-3419 ?OT Time  Calculation (min): 13 min ?Charges:  OT General Charges ?$OT Visit: 1 Visit ?OT Evaluation ?$OT Eval Low Complexity: 1 Low ? ?Raynald Kemp, OT ?Acute Rehabilitation Services ?Office: [phone] ? ? ?Raynald Kemp H ?05/09/2021, 12:30 PM ?

## 2021-05-09 NOTE — Progress Notes (Signed)
?                                  PROGRESS NOTE                                             ?                                                                                                                     ?                                         ? ? Patient Demographics:  ? ? Matthew [name], is a 81 y.o. male, DOB - 05-12-1940, QQV:[id number] ? ?Outpatient Primary MD for the patient is Pcp, No    LOS - 2  Admit date - 05/07/2021   ? ?Chief Complaint  ?Patient presents with  ? Altered Mental Status  ?    ? ?Brief Narrative (HPI from H&P) - 81 y.o. African-American male with medical history significant for coronary artery disease, cardiomyopathy, CHF, hypertension and osteoarthritis, presented to emergency room with acute onset of altered mental status as well as shaking chills.  He was recently treated for UTI with several days of p.o. Levaquin, ER he was diagnosed with dehydration likely due to Aldactone and admitted to the hospital for metabolic encephalopathy ? ? Subjective:  ? ?Patient in bed, appears comfortable, denies any headache, no fever, no chest pain or pressure, no shortness of breath , no abdominal pain. No new focal weakness. ? ? ? Assessment  & Plan :  ? ? ?Metabolic encephalopathy - most likely caused by dehydration in the setting of recent UTI and being on Aldactone, with gentle hydration with IV fluids is much better, head CT is nonacute, he has no headache or focal deficits, mental status close to baseline now.  Further IV fluids, advance activity with PT OT, likely discharge tomorrow if all good. ? ?2.  Chronic heart failure EF around 25%.  Currently dehydrated.  Continue Sherryll Burger and Marcelline Deist, will add low-dose beta-blocker along with resume his home dose Aldactone and a single dose of oral Lasix on 05/09/2021, cardiology follow-up post discharge. ? ?3.  BPH.  On Flomax. ? ?4.  Hypokalemia.  Replaced.  Monitor. ? ?5.  Dyslipidemia.  On statin. ? ?    ? ?Condition - Fair ? ?Family Communication  :   Daughter [name] [phone] on 05/08/2021 ? ?Code Status :  Full ? ?Consults  :  None ? ?PUD Prophylaxis :  ? ? Procedures  :    ? ?CT head  - non acute ? ?   ? ?Disposition Plan  :   ? ?Status is: Inpatient ? ?  DVT Prophylaxis  :   ? ?enoxaparin (LOVENOX) injection 40 mg Start: 05/08/21 1000 ? ?Lab Results  ?Component Value Date  ? PLT 112 (L) 05/09/2021  ? ? ?Diet :  ?Diet Order   ? ?       ?  Diet Heart Room service appropriate? Yes; Fluid consistency: Thin  Diet effective now       ?  ? ?  ?  ? ?  ?  ? ?Inpatient Medications ? ?Scheduled Meds: ? aspirin EC  81 mg Oral Daily  ? atorvastatin  20 mg Oral Daily  ? dapagliflozin propanediol  10 mg Oral Daily  ? enoxaparin (LOVENOX) injection  40 mg Subcutaneous Daily  ? furosemide  40 mg Oral Once  ? metoprolol tartrate  25 mg Oral BID  ? potassium chloride  40 mEq Oral Once  ? sacubitril-valsartan  1 tablet Oral BID  ? spironolactone  25 mg Oral QHS  ? tamsulosin  0.4 mg Oral Daily  ? ?Continuous Infusions: ? magnesium sulfate bolus IVPB    ? ?PRN Meds:.acetaminophen **OR** acetaminophen, magnesium hydroxide, ondansetron **OR** ondansetron (ZOFRAN) IV, traZODone ? ?Antibiotics  :   ? ?Anti-infectives (From admission, onward)  ? ? None  ? ?  ? ? ? Time Spent in minutes  30 ? ? ?Susa Raring M.D on 05/09/2021 at 9:54 AM ? ?To page go to [email or web address]  ? ?[facility] -  Office  [phone] ? ?See all Orders from today for further details ? ? ? Objective:  ? ?Vitals:  ? 05/09/21 0100 05/09/21 0120 05/09/21 0300 05/09/21 0321  ?BP:    111/80  ?Pulse: (!) 115   100  ?Resp: (!) 24 19 19 20   ?Temp:  98.3 ?F (36.8 ?C)  97.9 ?F (36.6 ?C)  ?TempSrc:    Oral  ?SpO2: 98%     ?Weight:      ?Height:      ? ? ?Wt Readings from Last 3 Encounters:  ?05/08/21 100.3 kg  ?04/08/21 103.5 kg  ?09/02/20 104.8 kg  ? ? ? ?Intake/Output Summary (Last 24 hours) at 05/09/2021 0954 ?Last data filed at 05/09/2021 0445 ?Gross per 24 hour   ?Intake 360 ml  ?Output 1200 ml  ?Net -840 ml  ? ? ? ?Physical Exam ? ?Awake Alert, No new F.N deficits, Normal affect ?[place].AT,PERRAL ?Supple Neck, No JVD,   ?Symmetrical Chest wall movement, Good air movement bilaterally, few rales ?RRR,No Gallops, Rubs or new Murmurs,  ?+ve B.Sounds, Abd Soft, No tenderness,   ?No Cyanosis, Clubbing or edema  ? ?   ? ? Data Review:  ? ? ?CBC ?Recent Labs  ?Lab 05/07/21 ?2130 05/09/21 ?05/11/21  ?WBC 4.3 4.9  ?HGB 13.5 12.0*  ?HCT 42.9 36.5*  ?PLT 123* 112*  ?MCV 80.8 78.3*  ?MCH 25.4* 25.8*  ?MCHC 31.5 32.9  ?RDW 22.0* 21.6*  ?LYMPHSABS 1.2 1.1  ?MONOABS 0.5 0.5  ?EOSABS 0.0 0.0  ?BASOSABS 0.0 0.0  ? ? ?Electrolytes ?Recent Labs  ?Lab 05/07/21 ?2130 05/08/21 ?1031 05/08/21 ?1544 05/09/21 ?05/11/21  ?NA 135 136  --  137  ?K 3.4* 4.5  --  3.7  ?CL 104 108  --  107  ?CO2 21* 17*  --  22  ?GLUCOSE 92 117*  --  95  ?BUN 19 19  --  16  ?CREATININE 1.65* 1.53*  --  1.34*  ?CALCIUM 8.9 8.5*  --  8.6*  ?AST 19  --   --  13*  ?ALT 16  --   --  11  ?ALKPHOS 111  --   --  98  ?BILITOT 1.6*  --   --  1.1  ?ALBUMIN 3.3*  --   --  2.8*  ?MG  --  1.7  --  1.9  ?LATICACIDVEN 1.6  --   --   --   ?HGBA1C 5.8*  --   --   --   ?AMMONIA 34  --   --   --   ?BNP  --   --  9,937.1* >4,500.0*  ? ? ?------------------------------------------------------------------------------------------------------------------ ?No results for input(s): CHOL, HDL, LDLCALC, TRIG, CHOLHDL, LDLDIRECT in the last 72 hours. ? ?Lab Results  ?Component Value Date  ? HGBA1C 5.8 (H) 05/07/2021  ? ? ?No results for input(s): TSH, T4TOTAL, T3FREE, THYROIDAB in the last 72 hours. ? ?Invalid input(s): FREET3 ?------------------------------------------------------------------------------------------------------------------ ?ID Labs ?Recent Labs  ?Lab 05/07/21 ?2130 05/08/21 ?1031 05/09/21 ?6967  ?WBC 4.3  --  4.9  ?PLT 123*  --  112*  ?LATICACIDVEN 1.6  --   --   ?CREATININE 1.65* 1.53* 1.34*  ? ?Cardiac Enzymes ?No results for input(s): CKMB,  TROPONINI, MYOGLOBIN in the last 168 hours. ? ?Invalid input(s): CK ? ?Radiology Reports ?DG Chest 1 View ? ?Result Date: 05/07/2021 ?CLINICAL DATA:  Altered mental status EXAM: CHEST  1 VIEW COMPARISON:  04/30/2021 FINDINGS: Check shadow is enlarged. Aortic calcifications are noted. The lungs are well aerated bilaterally. No focal infiltrate or sizable effusion is seen. No bony abnormality is noted. IMPRESSION: No active disease. Electronically Signed   By: Alcide Clever M.D.   On: 05/07/2021 22:19  ? ?CT HEAD WO CONTRAST ? ?Result Date: 05/07/2021 ?CLINICAL DATA:  Altered mental status EXAM: CT HEAD WITHOUT CONTRAST TECHNIQUE: Contiguous axial images were obtained from the base of the skull through the vertex without intravenous contrast. RADIATION DOSE REDUCTION: This exam was performed according to the departmental dose-optimization program which includes automated exposure control, adjustment of the mA and/or kV according to patient size and/or use of iterative reconstruction technique. COMPARISON:  04/30/2021 FINDINGS: Brain: No evidence of acute infarction, hemorrhage, hydrocephalus, extra-axial collection or mass lesion/mass effect. Mild atrophic changes and chronic white matter ischemic changes are seen. Vascular: No hyperdense vessel or unexpected calcification. Skull: Normal. Negative for fracture or focal lesion. Sinuses/Orbits: Mucosal thickening is noted within the left maxillary antrum. This is increased in the interval from the prior exam. Opacification of the left frontal sinus and portions the left ethmoid sinuses are noted chronic in nature. Other: None. IMPRESSION: Chronic atrophic and ischemic changes. Chronic sinus changes as described. Electronically Signed   By: Alcide Clever M.D.   On: 05/07/2021 22:17    ? ? ?

## 2021-05-10 ENCOUNTER — Other Ambulatory Visit: Payer: Self-pay

## 2021-05-10 ENCOUNTER — Other Ambulatory Visit: Payer: Self-pay | Admitting: Interventional Cardiology

## 2021-05-10 DIAGNOSIS — G9341 Metabolic encephalopathy: Secondary | ICD-10-CM | POA: Diagnosis not present

## 2021-05-10 LAB — CBC WITH DIFFERENTIAL/PLATELET
Abs Immature Granulocytes: 0.01 10*3/uL (ref 0.00–0.07)
Basophils Absolute: 0 10*3/uL (ref 0.0–0.1)
Basophils Relative: 0 %
Eosinophils Absolute: 0 10*3/uL (ref 0.0–0.5)
Eosinophils Relative: 1 %
HCT: 37.1 % — ABNORMAL LOW (ref 39.0–52.0)
Hemoglobin: 12 g/dL — ABNORMAL LOW (ref 13.0–17.0)
Immature Granulocytes: 0 %
Lymphocytes Relative: 22 %
Lymphs Abs: 1 10*3/uL (ref 0.7–4.0)
MCH: 25.5 pg — ABNORMAL LOW (ref 26.0–34.0)
MCHC: 32.3 g/dL (ref 30.0–36.0)
MCV: 78.8 fL — ABNORMAL LOW (ref 80.0–100.0)
Monocytes Absolute: 0.5 10*3/uL (ref 0.1–1.0)
Monocytes Relative: 12 %
Neutro Abs: 2.9 10*3/uL (ref 1.7–7.7)
Neutrophils Relative %: 65 %
Platelets: 104 10*3/uL — ABNORMAL LOW (ref 150–400)
RBC: 4.71 MIL/uL (ref 4.22–5.81)
RDW: 21.7 % — ABNORMAL HIGH (ref 11.5–15.5)
WBC: 4.5 10*3/uL (ref 4.0–10.5)
nRBC: 0 % (ref 0.0–0.2)

## 2021-05-10 LAB — COMPREHENSIVE METABOLIC PANEL
ALT: 11 U/L (ref 0–44)
AST: 14 U/L — ABNORMAL LOW (ref 15–41)
Albumin: 2.8 g/dL — ABNORMAL LOW (ref 3.5–5.0)
Alkaline Phosphatase: 102 U/L (ref 38–126)
Anion gap: 8 (ref 5–15)
BUN: 13 mg/dL (ref 8–23)
CO2: 22 mmol/L (ref 22–32)
Calcium: 8.7 mg/dL — ABNORMAL LOW (ref 8.9–10.3)
Chloride: 106 mmol/L (ref 98–111)
Creatinine, Ser: 1.33 mg/dL — ABNORMAL HIGH (ref 0.61–1.24)
GFR, Estimated: 54 mL/min — ABNORMAL LOW (ref >60)
Glucose, Bld: 112 mg/dL — ABNORMAL HIGH (ref 70–99)
Potassium: 4.3 mmol/L (ref 3.5–5.1)
Sodium: 136 mmol/L (ref 135–145)
Total Bilirubin: 1.4 mg/dL — ABNORMAL HIGH (ref 0.3–1.2)
Total Protein: 6.7 g/dL (ref 6.5–8.1)

## 2021-05-10 LAB — MAGNESIUM: Magnesium: 2.1 mg/dL (ref 1.7–2.4)

## 2021-05-10 LAB — BRAIN NATRIURETIC PEPTIDE: B Natriuretic Peptide: >4500 pg/mL — ABNORMAL HIGH (ref 0.0–100.0)

## 2021-05-10 MED ORDER — METOPROLOL TARTRATE 12.5 MG HALF TABLET
12.5000 mg | ORAL_TABLET | Freq: Two times a day (BID) | ORAL | Status: DC
  Administered 2021-05-10: 12.5 mg via ORAL
  Filled 2021-05-10: qty 1

## 2021-05-10 MED ORDER — METOPROLOL SUCCINATE ER 25 MG PO TB24: 37.5000 mg | ORAL_TABLET | Freq: Every day | ORAL | 0 refills | Status: DC

## 2021-05-10 MED ORDER — FUROSEMIDE 20 MG PO TABS: 20.0000 mg | ORAL_TABLET | Freq: Two times a day (BID) | ORAL | 2 refills | Status: DC

## 2021-05-10 MED ORDER — LACTATED RINGERS IV BOLUS
250.0000 mL | Freq: Once | INTRAVENOUS | Status: AC
  Administered 2021-05-10: 250 mL via INTRAVENOUS

## 2021-05-10 NOTE — Telephone Encounter (Signed)
Pt's medication was sent to pt's pharmacy as requested. Confirmation received.  °

## 2021-05-10 NOTE — Discharge Summary (Signed)
?                                                                                ? ?Matthew [name]:[id number] DOB: 1940/03/30 DOA: 05/07/2021 ? ?PCP: Pcp, No ? ?Admit date: 05/07/2021  Discharge date: 05/10/2021 ? ?Admitted From: Home   Disposition:  Home ? ? ?Recommendations for Outpatient Follow-up:  ? ?Follow up with PCP in 1-2 weeks ? ?PCP Please obtain BMP/CBC, 2 view CXR in 1week,  (see Discharge instructions)  ? ?PCP Please follow up on the following pending results:  ? ? ?Home Health: PT, OT  ?Equipment/Devices: None  ?Consultations: None  ?Discharge Condition: Stable    ?CODE STATUS: Full    ?Diet Recommendation: Heart Healthy with 1.5 L fluid restriction per day ?  ? ?Chief Complaint  ?Patient presents with  ? Altered Mental Status  ?  ? ?Brief history of present illness from the day of admission and additional interim summary   ? ?81 y.o. African-American male with medical history significant for coronary artery disease, cardiomyopathy, CHF, hypertension and osteoarthritis, presented to emergency room with acute onset of altered mental status as well as shaking chills.  He was recently treated for UTI with several days of p.o. Levaquin, ER he was diagnosed with dehydration likely due to Aldactone and admitted to the hospital for metabolic encephalopathy ? ?                                                               Hospital Course  ? ?  ?Metabolic encephalopathy - most likely caused by dehydration in the setting of recent UTI and being on Aldactone, with gentle hydration with IV fluids is much better, head CT is nonacute, he has no headache or focal deficits, mental status close to baseline now.  He has been seen by PT OT and will get the same at home, does not want SNF.  Will be discharged home. ?  ?2.  Chronic heart failure EF around 25%.  Currently dehydrated.  Continue Sherryll Burger and Marcelline Deist, will add  low-dose beta-blocker along with resume his home dose Aldactone have requested him to follow-up with his PCP and primary cardiologist post discharge. ?  ?3.  BPH.  On Flomax. ?  ?4.  Hypokalemia.  Was replaced and stable. ?  ?5.  Dyslipidemia.  On statin. ?  ?6.  Weakness and deconditioning.  Largely wheelchair-bound, home PT OT does not want SNF. ? ?Discharge diagnosis   ? ? ?Principal Problem: ?  Acute metabolic encephalopathy ?Active Problems: ?  Hypokalemia ?  Glucosuria ?  Congestive dilated cardiomyopathy (HCC) ?  BPH (benign prostatic hyperplasia) ? ? ? ?Discharge instructions   ? ?Discharge Instructions   ? ? Diet - low sodium heart healthy   Complete by: As directed ?  ? Discharge instructions   Complete by: As directed ?  ? Follow with Primary MD  in 7 days  ? ?Get CBC, CMP, 2 view Chest  X ray -  checked next visit within 1 week by Primary MD   ? ?Activity: As tolerated with Full fall precautions use [place]/cane & assistance as needed ? ?Disposition Home   ? ?Diet: Heart Healthy  with 1.5 lit / day fluid restriction ? ?Special Instructions: If you have smoked or chewed Tobacco  in the last 2 yrs please stop smoking, stop any regular Alcohol  and or any Recreational drug use. ? ?On your next visit with your primary care physician please Get Medicines reviewed and adjusted. ? ?Please request your Prim.MD to go over all Hospital Tests and Procedure/Radiological results at the follow up, please get all Hospital records sent to your Prim MD by signing hospital release before you go home. ? ?If you experience worsening of your admission symptoms, develop shortness of breath, life threatening emergency, suicidal or homicidal thoughts you must seek medical attention immediately by calling 911 or calling your MD immediately  if symptoms less severe. ? ?You Must read complete instructions/literature along with all the possible adverse reactions/side effects for all the Medicines you take and that have been  prescribed to you. Take any new Medicines after you have completely understood and accpet all the possible adverse reactions/side effects.  ? Increase activity slowly   Complete by: As directed ?  ? No wound care   Complete by: As directed ?  ? ?  ? ? ?Discharge Medications  ? ?Allergies as of 05/10/2021   ? ?   Reactions  ? Lactose Intolerance (gi) Other (See Comments)  ? unknown  ? ?  ? ?  ?Medication List  ?  ? ?TAKE these medications   ? ?acetaminophen 500 MG tablet ?Commonly known as: TYLENOL ?Take 500 mg by mouth every 6 (six) hours as needed for moderate pain. ?  ?aspirin EC 81 MG tablet ?Take 1 tablet (81 mg total) by mouth daily. ?  ?atorvastatin 10 MG tablet ?Commonly known as: LIPITOR ?TAKE 1 TABLET(10 MG) BY MOUTH DAILY ?What changed: See the new instructions. ?  ?cephALEXin 500 MG capsule ?Commonly known as: KEFLEX ?Take 1 capsule (500 mg total) by mouth 2 (two) times daily. ?  ?dapagliflozin propanediol 10 MG Tabs tablet ?Commonly known as: [place] ?Take 1 tablet (10 mg total) by mouth daily. ?  ?furosemide 20 MG tablet ?Commonly known as: LASIX ?Take 1 tablet (20 mg total) by mouth 2 (two) times daily. ?  ?metoprolol succinate 25 MG 24 hr tablet ?Commonly known as: TOPROL-XL ?Take 1.5 tablets (37.5 mg total) by mouth at bedtime. ?  ?Prostate Health Caps ?Take 1 capsule by mouth in the morning and at bedtime. ?  ?sacubitril-valsartan 24-26 MG ?Commonly known as: ENTRESTO ?Take 1 tablet by mouth 2 (two) times daily. ?  ?spironolactone 25 MG tablet ?Commonly known as: ALDACTONE ?Take 1 tablet (25 mg total) by mouth at bedtime. ?  ?traMADol 50 MG tablet ?Commonly known as: ULTRAM ?Take 50 mg by mouth every 8 (eight) hours as needed for pain. ?  ? ?  ? ? ? Follow-up Information   ? ? Corky Crafts, MD. Schedule an appointment as soon as possible for a visit in 1 week(s).   ?Specialties: Cardiology, Radiology, Interventional Cardiology ?Contact information: ?[address] ?[address] ?[place] [address] ?[phone] ? ? ?  ?  ? ?  ?  ? ?  ? ? ?Major procedures and Radiology Reports - PLEASE review detailed and final reports thoroughly  -    ? ?  ?  DG Chest 1 View ? ?Result Date: 05/07/2021 ?CLINICAL DATA:  Altered mental status EXAM: CHEST  1 VIEW COMPARISON:  04/30/2021 FINDINGS: Check shadow is enlarged. Aortic calcifications are noted. The lungs are well aerated bilaterally. No focal infiltrate or sizable effusion is seen. No bony abnormality is noted. IMPRESSION: No active disease. Electronically Signed   By: Alcide Clever M.D.   On: 05/07/2021 22:19  ? ?CT HEAD WO CONTRAST ? ?Result Date: 05/07/2021 ?CLINICAL DATA:  Altered mental status EXAM: CT HEAD WITHOUT CONTRAST TECHNIQUE: Contiguous axial images were obtained from the base of the skull through the vertex without intravenous contrast. RADIATION DOSE REDUCTION: This exam was performed according to the departmental dose-optimization program which includes automated exposure control, adjustment of the mA and/or kV according to patient size and/or use of iterative reconstruction technique. COMPARISON:  04/30/2021 FINDINGS: Brain: No evidence of acute infarction, hemorrhage, hydrocephalus, extra-axial collection or mass lesion/mass effect. Mild atrophic changes and chronic white matter ischemic changes are seen. Vascular: No hyperdense vessel or unexpected calcification. Skull: Normal. Negative for fracture or focal lesion. Sinuses/Orbits: Mucosal thickening is noted within the left maxillary antrum. This is increased in the interval from the prior exam. Opacification of the left frontal sinus and portions the left ethmoid sinuses are noted chronic in nature. Other: None. IMPRESSION: Chronic atrophic and ischemic changes. Chronic sinus changes as described. Electronically Signed   By: Alcide Clever M.D.   On: 05/07/2021 22:17  ? ?CT Head Wo Contrast ? ?Result Date: 04/30/2021 ?CLINICAL DATA:  Headache. EXAM: CT HEAD WITHOUT CONTRAST  TECHNIQUE: Contiguous axial images were obtained from the base of the skull through the vertex without intravenous contrast. RADIATION DOSE REDUCTION: This exam was performed according to the departmental dose-optimization program which

## 2021-05-10 NOTE — Addendum Note (Signed)
Addended by: Margaret Pyle D on: 05/10/2021 04:08 PM ? ? Modules accepted: Orders ? ?

## 2021-05-10 NOTE — TOC Transition Note (Signed)
Transition of Care (TOC) - CM/SW Discharge Note ? ? ?Patient Details  ?Name: Matthew [name] ?MRN: [id number] ?Date of Birth: 11-01-1940 ? ?Transition of Care (TOC) CM/SW Contact:  ?Lawerance Sabal, RN ?Phone Number: ?05/10/2021, 8:57 AM ? ? ?Clinical Narrative:    ?Plan for DC to home. ?Notified [facility] that patient will DC oday to resume HH services.  ? ? ? ?Final next level of care: Home w Home Health Services ?Barriers to Discharge: No Barriers Identified ? ? ?Patient Goals and CMS Choice ?Patient states their goals for this hospitalization and ongoing recovery are:: to go home ?CMS Medicare.gov Compare Post Acute Care list provided to:: Patient ?Choice offered to / list presented to : Patient ? ?Discharge Placement ?  ?           ?  ?  ?  ?  ? ?Discharge Plan and Services ?  ?  ?           ?  ?  ?  ?  ?  ?HH Arranged: PT, OT ?HH Agency: [facility] ?Date HH Agency Contacted: 05/10/21 ?Time HH Agency Contacted: [phone] ?Representative spoke with at [facility] Agency: Vangie Bicker RN ? ?Social Determinants of Health (SDOH) Interventions ?  ? ? ?Readmission Risk Interventions ? ?  05/10/2021  ?  8:52 AM  ?Readmission Risk Prevention Plan  ?Transportation Screening Complete  ?PCP or Specialist Appt within 5-7 Days Complete  ?Home Care Screening Complete  ?Medication Review (RN CM) Referral to Pharmacy  ? ? ? ? ? ?

## 2021-05-10 NOTE — Discharge Instructions (Signed)
Follow with Primary MD  in 7 days  ? ?Get CBC, CMP, 2 view Chest X ray -  checked next visit within 1 week by Primary MD   ? ?Activity: As tolerated with Full fall precautions use [place]/cane & assistance as needed ? ?Disposition Home   ? ?Diet: Heart Healthy  with 1.5 lit / day fluid restriction ? ?Special Instructions: If you have smoked or chewed Tobacco  in the last 2 yrs please stop smoking, stop any regular Alcohol  and or any Recreational drug use. ? ?On your next visit with your primary care physician please Get Medicines reviewed and adjusted. ? ?Please request your Prim.MD to go over all Hospital Tests and Procedure/Radiological results at the follow up, please get all Hospital records sent to your Prim MD by signing hospital release before you go home. ? ?If you experience worsening of your admission symptoms, develop shortness of breath, life threatening emergency, suicidal or homicidal thoughts you must seek medical attention immediately by calling 911 or calling your MD immediately  if symptoms less severe. ? ?You Must read complete instructions/literature along with all the possible adverse reactions/side effects for all the Medicines you take and that have been prescribed to you. Take any new Medicines after you have completely understood and accpet all the possible adverse reactions/side effects.  ? ?  ?

## 2021-05-10 NOTE — Care Management Important Message (Signed)
Important Message ? ?Patient Details  ?Name: Matthew [name] ?MRN: [id number] ?Date of Birth: [birth year] ? ? ?Medicare Important Message Given:  Yes ? ? ? ? ?Jiovanna Frei ?05/10/2021, 1:54 PM ?

## 2021-05-12 LAB — CULTURE, BLOOD (ROUTINE X 2)
Culture: NO GROWTH
Culture: NO GROWTH

## 2021-05-13 ENCOUNTER — Inpatient Hospital Stay (HOSPITAL_COMMUNITY)
Admission: EM | Admit: 2021-05-13 | Discharge: 2021-06-08 | DRG: 981 | Disposition: A | Discharge status: Rehabilitation Facility | Payer: Medicare Other | Attending: Internal Medicine | Admitting: Internal Medicine

## 2021-05-13 ENCOUNTER — Other Ambulatory Visit (HOSPITAL_COMMUNITY): Payer: Self-pay

## 2021-05-13 ENCOUNTER — Other Ambulatory Visit: Payer: Self-pay

## 2021-05-13 ENCOUNTER — Ambulatory Visit (HOSPITAL_BASED_OUTPATIENT_CLINIC_OR_DEPARTMENT_OTHER)
Admission: RE | Admit: 2021-05-13 | Discharge: 2021-05-13 | Disposition: A | Discharge status: Home / Self Care | Payer: Medicare Other | Source: Ambulatory Visit | Attending: Adult Health | Admitting: Adult Health

## 2021-05-13 ENCOUNTER — Encounter (HOSPITAL_COMMUNITY): Payer: Self-pay | Admitting: Emergency Medicine

## 2021-05-13 ENCOUNTER — Encounter (HOSPITAL_COMMUNITY): Payer: Self-pay

## 2021-05-13 ENCOUNTER — Emergency Department (HOSPITAL_COMMUNITY): Payer: Medicare Other

## 2021-05-13 VITALS — BP 106/80 | HR 99 | Wt 234.2 lb

## 2021-05-13 DIAGNOSIS — E785 Hyperlipidemia, unspecified: Secondary | ICD-10-CM | POA: Insufficient documentation

## 2021-05-13 DIAGNOSIS — R54 Age-related physical debility: Secondary | ICD-10-CM | POA: Diagnosis present

## 2021-05-13 DIAGNOSIS — Z20822 Contact with and (suspected) exposure to covid-19: Secondary | ICD-10-CM | POA: Diagnosis present

## 2021-05-13 DIAGNOSIS — E875 Hyperkalemia: Secondary | ICD-10-CM | POA: Diagnosis not present

## 2021-05-13 DIAGNOSIS — I4729 Other ventricular tachycardia: Secondary | ICD-10-CM | POA: Diagnosis present

## 2021-05-13 DIAGNOSIS — I110 Hypertensive heart disease with heart failure: Secondary | ICD-10-CM | POA: Insufficient documentation

## 2021-05-13 DIAGNOSIS — Z79899 Other long term (current) drug therapy: Secondary | ICD-10-CM

## 2021-05-13 DIAGNOSIS — Z006 Encounter for examination for normal comparison and control in clinical research program: Secondary | ICD-10-CM

## 2021-05-13 DIAGNOSIS — I420 Dilated cardiomyopathy: Secondary | ICD-10-CM

## 2021-05-13 DIAGNOSIS — J9600 Acute respiratory failure, unspecified whether with hypoxia or hypercapnia: Secondary | ICD-10-CM

## 2021-05-13 DIAGNOSIS — I959 Hypotension, unspecified: Secondary | ICD-10-CM | POA: Diagnosis not present

## 2021-05-13 DIAGNOSIS — L8990 Pressure ulcer of unspecified site, unspecified stage: Secondary | ICD-10-CM | POA: Insufficient documentation

## 2021-05-13 DIAGNOSIS — M7989 Other specified soft tissue disorders: Secondary | ICD-10-CM | POA: Insufficient documentation

## 2021-05-13 DIAGNOSIS — Z87440 Personal history of urinary (tract) infections: Secondary | ICD-10-CM | POA: Insufficient documentation

## 2021-05-13 DIAGNOSIS — M1990 Unspecified osteoarthritis, unspecified site: Secondary | ICD-10-CM | POA: Diagnosis present

## 2021-05-13 DIAGNOSIS — I739 Peripheral vascular disease, unspecified: Secondary | ICD-10-CM | POA: Diagnosis present

## 2021-05-13 DIAGNOSIS — Z7982 Long term (current) use of aspirin: Secondary | ICD-10-CM

## 2021-05-13 DIAGNOSIS — I340 Nonrheumatic mitral (valve) insufficiency: Secondary | ICD-10-CM

## 2021-05-13 DIAGNOSIS — J9811 Atelectasis: Secondary | ICD-10-CM | POA: Diagnosis present

## 2021-05-13 DIAGNOSIS — Z95818 Presence of other cardiac implants and grafts: Secondary | ICD-10-CM

## 2021-05-13 DIAGNOSIS — I452 Bifascicular block: Secondary | ICD-10-CM | POA: Diagnosis present

## 2021-05-13 DIAGNOSIS — I5022 Chronic systolic (congestive) heart failure: Secondary | ICD-10-CM

## 2021-05-13 DIAGNOSIS — I2609 Other pulmonary embolism with acute cor pulmonale: Secondary | ICD-10-CM | POA: Diagnosis not present

## 2021-05-13 DIAGNOSIS — Z7901 Long term (current) use of anticoagulants: Secondary | ICD-10-CM | POA: Insufficient documentation

## 2021-05-13 DIAGNOSIS — E43 Unspecified severe protein-calorie malnutrition: Secondary | ICD-10-CM | POA: Insufficient documentation

## 2021-05-13 DIAGNOSIS — Z9889 Other specified postprocedural states: Secondary | ICD-10-CM

## 2021-05-13 DIAGNOSIS — Z7984 Long term (current) use of oral hypoglycemic drugs: Secondary | ICD-10-CM

## 2021-05-13 DIAGNOSIS — E669 Obesity, unspecified: Secondary | ICD-10-CM | POA: Insufficient documentation

## 2021-05-13 DIAGNOSIS — Z833 Family history of diabetes mellitus: Secondary | ICD-10-CM

## 2021-05-13 DIAGNOSIS — N400 Enlarged prostate without lower urinary tract symptoms: Secondary | ICD-10-CM | POA: Diagnosis present

## 2021-05-13 DIAGNOSIS — D696 Thrombocytopenia, unspecified: Secondary | ICD-10-CM | POA: Diagnosis present

## 2021-05-13 DIAGNOSIS — T502X5A Adverse effect of carbonic-anhydrase inhibitors, benzothiadiazides and other diuretics, initial encounter: Secondary | ICD-10-CM | POA: Diagnosis not present

## 2021-05-13 DIAGNOSIS — I2510 Atherosclerotic heart disease of native coronary artery without angina pectoris: Secondary | ICD-10-CM

## 2021-05-13 DIAGNOSIS — N1832 Chronic kidney disease, stage 3b: Secondary | ICD-10-CM | POA: Diagnosis present

## 2021-05-13 DIAGNOSIS — R0602 Shortness of breath: Secondary | ICD-10-CM

## 2021-05-13 DIAGNOSIS — K219 Gastro-esophageal reflux disease without esophagitis: Secondary | ICD-10-CM | POA: Diagnosis not present

## 2021-05-13 DIAGNOSIS — Z681 Body mass index (BMI) 19 or less, adult: Secondary | ICD-10-CM

## 2021-05-13 DIAGNOSIS — H9190 Unspecified hearing loss, unspecified ear: Secondary | ICD-10-CM | POA: Diagnosis present

## 2021-05-13 DIAGNOSIS — N189 Chronic kidney disease, unspecified: Secondary | ICD-10-CM | POA: Diagnosis present

## 2021-05-13 DIAGNOSIS — Z8249 Family history of ischemic heart disease and other diseases of the circulatory system: Secondary | ICD-10-CM

## 2021-05-13 DIAGNOSIS — I472 Ventricular tachycardia, unspecified: Secondary | ICD-10-CM | POA: Diagnosis not present

## 2021-05-13 DIAGNOSIS — I5043 Acute on chronic combined systolic (congestive) and diastolic (congestive) heart failure: Secondary | ICD-10-CM | POA: Diagnosis present

## 2021-05-13 DIAGNOSIS — I130 Hypertensive heart and chronic kidney disease with heart failure and stage 1 through stage 4 chronic kidney disease, or unspecified chronic kidney disease: Secondary | ICD-10-CM | POA: Diagnosis present

## 2021-05-13 DIAGNOSIS — E871 Hypo-osmolality and hyponatremia: Secondary | ICD-10-CM | POA: Diagnosis not present

## 2021-05-13 DIAGNOSIS — N1831 Chronic kidney disease, stage 3a: Secondary | ICD-10-CM | POA: Diagnosis present

## 2021-05-13 DIAGNOSIS — I440 Atrioventricular block, first degree: Secondary | ICD-10-CM | POA: Diagnosis present

## 2021-05-13 DIAGNOSIS — I714 Abdominal aortic aneurysm, without rupture, unspecified: Secondary | ICD-10-CM | POA: Diagnosis present

## 2021-05-13 DIAGNOSIS — K5900 Constipation, unspecified: Secondary | ICD-10-CM | POA: Diagnosis not present

## 2021-05-13 DIAGNOSIS — I083 Combined rheumatic disorders of mitral, aortic and tricuspid valves: Secondary | ICD-10-CM | POA: Diagnosis present

## 2021-05-13 DIAGNOSIS — I5082 Biventricular heart failure: Secondary | ICD-10-CM | POA: Insufficient documentation

## 2021-05-13 DIAGNOSIS — I7121 Aneurysm of the ascending aorta, without rupture: Secondary | ICD-10-CM | POA: Diagnosis present

## 2021-05-13 DIAGNOSIS — E872 Acidosis, unspecified: Secondary | ICD-10-CM | POA: Diagnosis not present

## 2021-05-13 DIAGNOSIS — I428 Other cardiomyopathies: Secondary | ICD-10-CM | POA: Diagnosis present

## 2021-05-13 DIAGNOSIS — I2699 Other pulmonary embolism without acute cor pulmonale: Principal | ICD-10-CM | POA: Diagnosis present

## 2021-05-13 DIAGNOSIS — I493 Ventricular premature depolarization: Secondary | ICD-10-CM | POA: Diagnosis present

## 2021-05-13 DIAGNOSIS — I509 Heart failure, unspecified: Secondary | ICD-10-CM

## 2021-05-13 DIAGNOSIS — R079 Chest pain, unspecified: Secondary | ICD-10-CM | POA: Diagnosis not present

## 2021-05-13 DIAGNOSIS — N179 Acute kidney failure, unspecified: Secondary | ICD-10-CM | POA: Diagnosis present

## 2021-05-13 DIAGNOSIS — E739 Lactose intolerance, unspecified: Secondary | ICD-10-CM | POA: Diagnosis present

## 2021-05-13 DIAGNOSIS — R64 Cachexia: Secondary | ICD-10-CM | POA: Diagnosis present

## 2021-05-13 HISTORY — DX: Nonrheumatic mitral (valve) insufficiency: I34.0

## 2021-05-13 HISTORY — DX: Thrombocytopenia, unspecified: D69.6

## 2021-05-13 HISTORY — DX: Chronic kidney disease, stage 3b: N18.32

## 2021-05-13 HISTORY — DX: Thoracic aortic aneurysm, without rupture, unspecified: I71.20

## 2021-05-13 HISTORY — DX: Chronic systolic (congestive) heart failure: I50.22

## 2021-05-13 HISTORY — DX: Other cardiomyopathies: I42.8

## 2021-05-13 HISTORY — DX: Rheumatic tricuspid insufficiency: I07.1

## 2021-05-13 HISTORY — DX: Hyperlipidemia, unspecified: E78.5

## 2021-05-13 LAB — BASIC METABOLIC PANEL
Anion gap: 9 (ref 5–15)
Anion gap: 10 (ref 5–15)
BUN: 18 mg/dL (ref 8–23)
BUN: 19 mg/dL (ref 8–23)
CO2: 25 mmol/L (ref 22–32)
CO2: 20 mmol/L — ABNORMAL LOW (ref 22–32)
Calcium: 9.3 mg/dL (ref 8.9–10.3)
Calcium: 9 mg/dL (ref 8.9–10.3)
Chloride: 102 mmol/L (ref 98–111)
Chloride: 105 mmol/L (ref 98–111)
Creatinine, Ser: 1.42 mg/dL — ABNORMAL HIGH (ref 0.61–1.24)
Creatinine, Ser: 1.42 mg/dL — ABNORMAL HIGH (ref 0.61–1.24)
GFR, Estimated: 50 mL/min — ABNORMAL LOW (ref >60)
GFR, Estimated: 50 mL/min — ABNORMAL LOW (ref >60)
Glucose, Bld: 91 mg/dL (ref 70–99)
Glucose, Bld: 109 mg/dL — ABNORMAL HIGH (ref 70–99)
Potassium: 5 mmol/L (ref 3.5–5.1)
Potassium: 4.9 mmol/L (ref 3.5–5.1)
Sodium: 136 mmol/L (ref 135–145)
Sodium: 135 mmol/L (ref 135–145)

## 2021-05-13 LAB — CBC
HCT: 41.1 % (ref 39.0–52.0)
HCT: 38.9 % — ABNORMAL LOW (ref 39.0–52.0)
Hemoglobin: 12.9 g/dL — ABNORMAL LOW (ref 13.0–17.0)
Hemoglobin: 12.6 g/dL — ABNORMAL LOW (ref 13.0–17.0)
MCH: 25.2 pg — ABNORMAL LOW (ref 26.0–34.0)
MCH: 25.8 pg — ABNORMAL LOW (ref 26.0–34.0)
MCHC: 31.4 g/dL (ref 30.0–36.0)
MCHC: 32.4 g/dL (ref 30.0–36.0)
MCV: 80.3 fL (ref 80.0–100.0)
MCV: 79.6 fL — ABNORMAL LOW (ref 80.0–100.0)
Platelets: 104 10*3/uL — ABNORMAL LOW (ref 150–400)
Platelets: 118 10*3/uL — ABNORMAL LOW (ref 150–400)
RBC: 5.12 MIL/uL (ref 4.22–5.81)
RBC: 4.89 MIL/uL (ref 4.22–5.81)
RDW: 21.7 % — ABNORMAL HIGH (ref 11.5–15.5)
RDW: 21.5 % — ABNORMAL HIGH (ref 11.5–15.5)
WBC: 4.3 10*3/uL (ref 4.0–10.5)
WBC: 4.6 10*3/uL (ref 4.0–10.5)
nRBC: 0 % (ref 0.0–0.2)
nRBC: 0 % (ref 0.0–0.2)

## 2021-05-13 LAB — BRAIN NATRIURETIC PEPTIDE: B Natriuretic Peptide: >4500 pg/mL — ABNORMAL HIGH (ref 0.0–100.0)

## 2021-05-13 LAB — TROPONIN I (HIGH SENSITIVITY): Troponin I (High Sensitivity): 14 ng/L (ref <18)

## 2021-05-13 IMAGING — DX DG CHEST 2V
2 series · 2 of 2 positions shown · non-contrast
Comparison: Chest x-ray [DATE]

CLINICAL DATA: cp

EXAM:
CHEST - 2 VIEW

[chest lat]
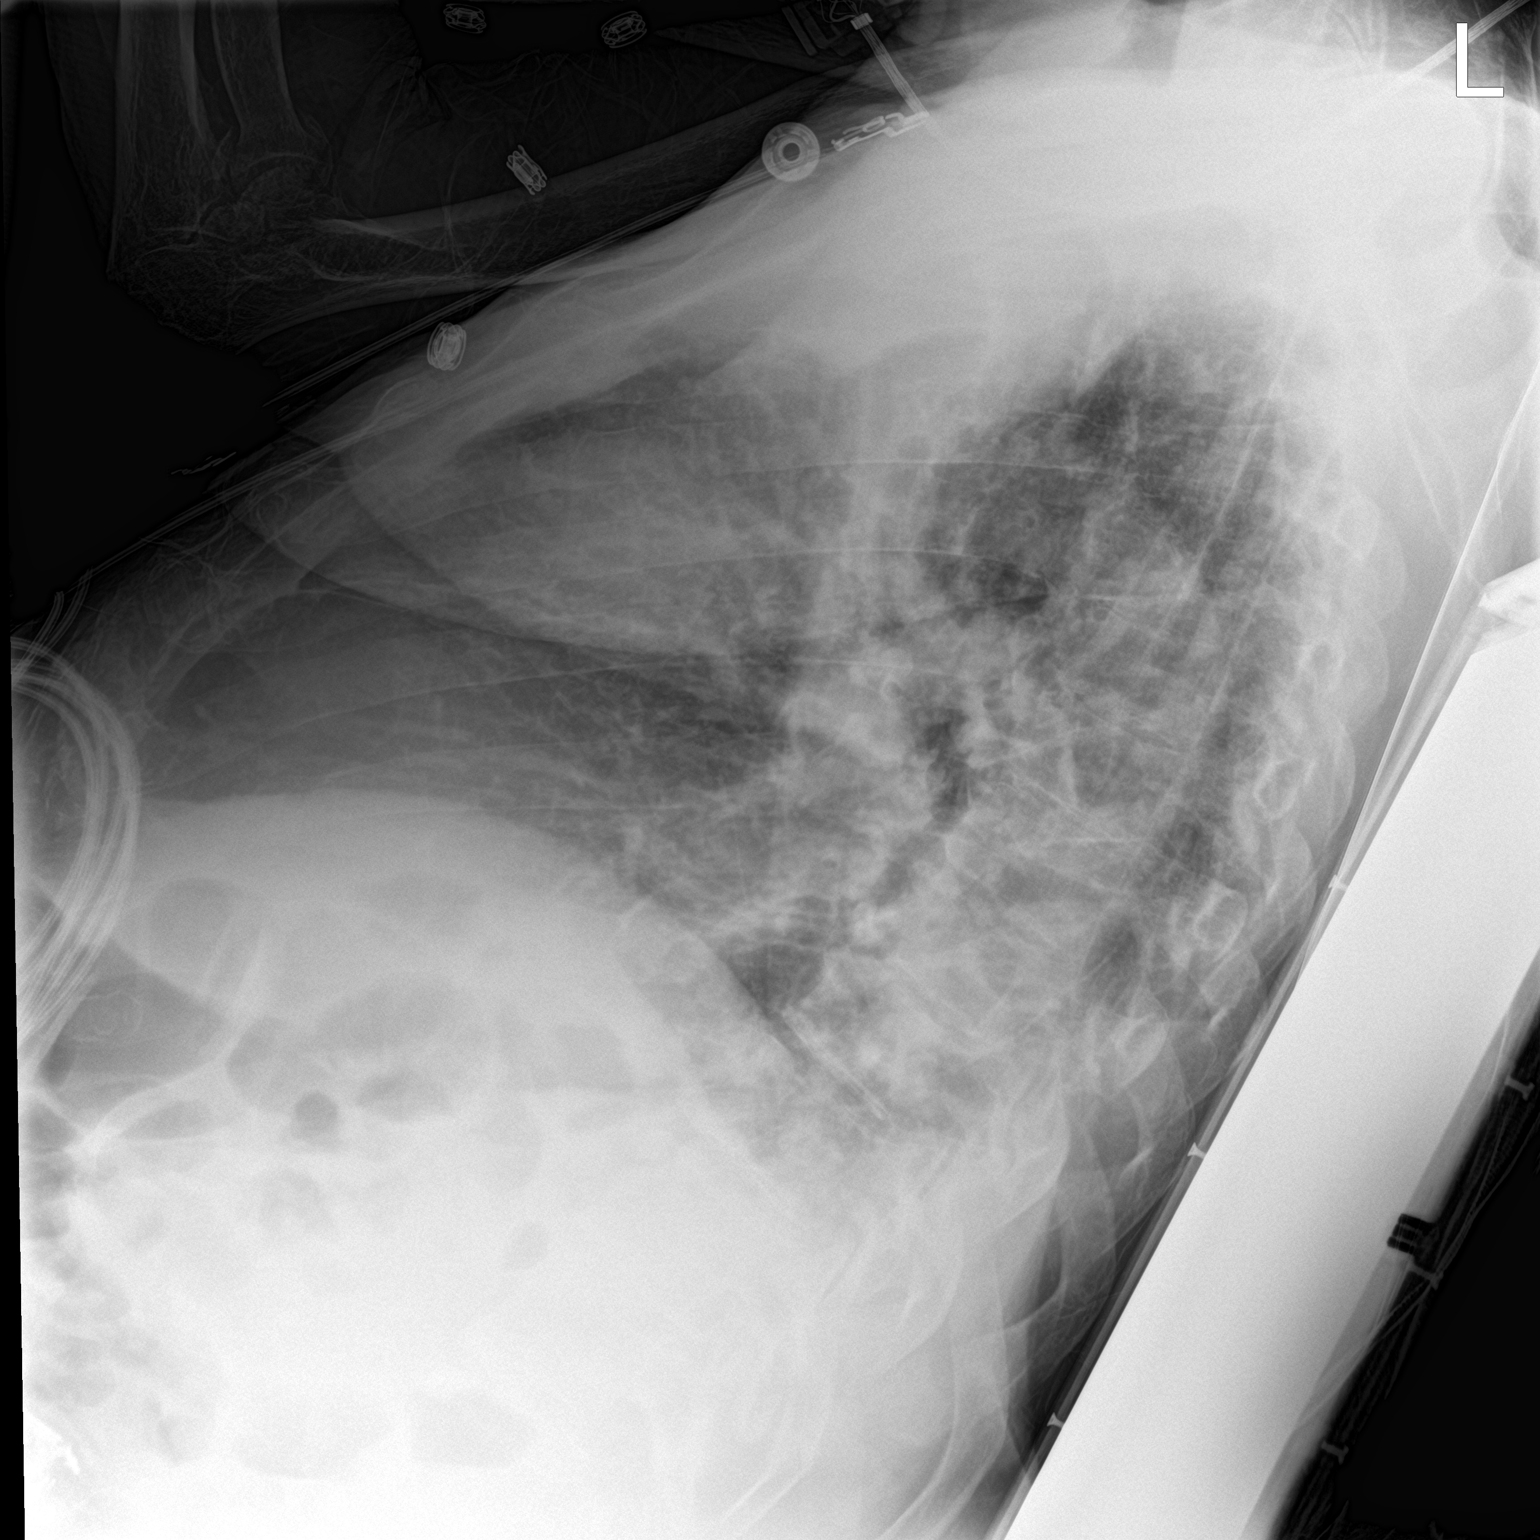

[chest ap]
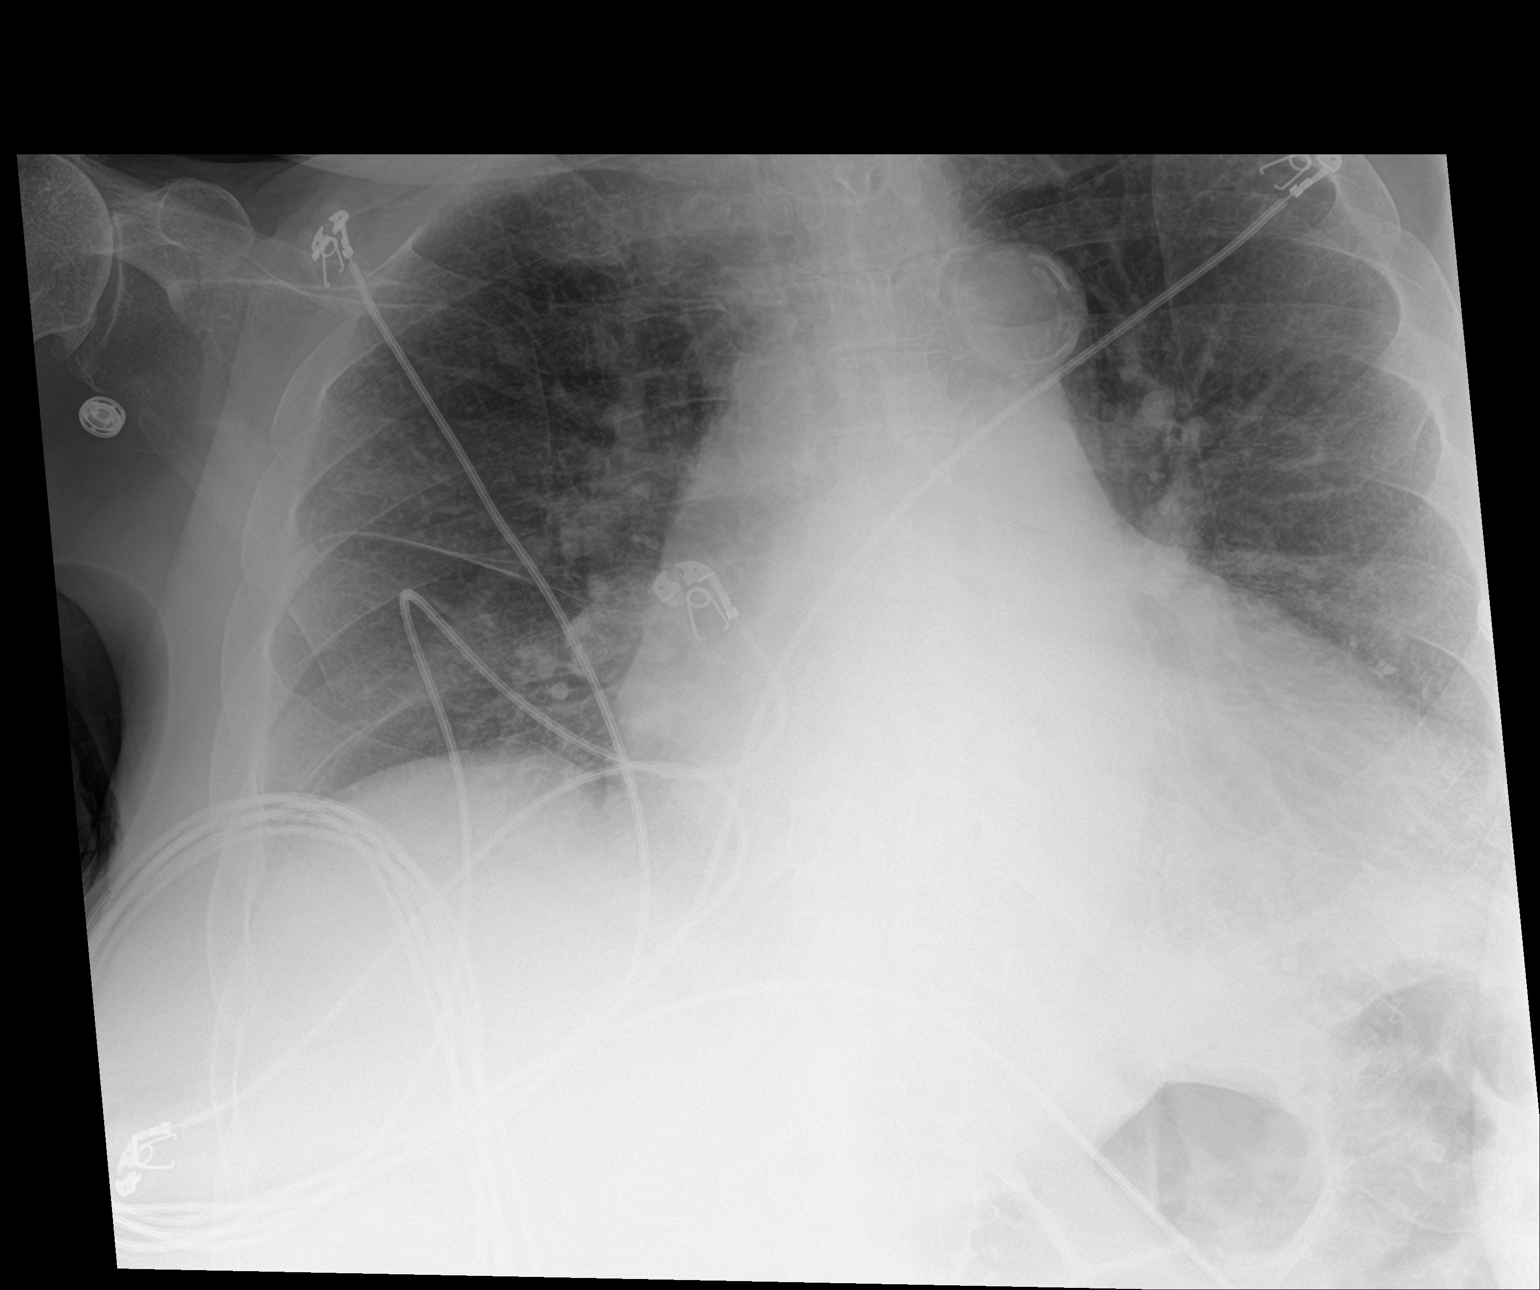

[2 of 2 positions shown; findings below may reference images not displayed]

FINDINGS: Similar-appearing cardiomegaly. The heart and mediastinal contours
are unchanged. Aortic calcification.

Retrocardiac patchy airspace opacity. No pulmonary edema. Trace left
pleural effusion. No pneumothorax.

No acute osseous abnormality.
IMPRESSION: 1. Retrocardiac patchy airspace opacity. Followup PA and lateral
chest X-ray is recommended in 3-4 weeks following trial of
antibiotic therapy to ensure resolution and exclude underlying
malignancy.
2. Trace left pleural effusion.
3.  Aortic Atherosclerosis ([JW]-[JW]).

## 2021-05-13 MED ORDER — FUROSEMIDE 20 MG PO TABS: ORAL_TABLET | ORAL | 2 refills | Status: DC

## 2021-05-13 NOTE — ED Provider Notes (Signed)
?[facility] ?Provider Note ? ? ?CSN: [id number] ?Arrival date & time: 05/13/21  2122 ? ?  ? ?History ? ?Chief Complaint  ?Patient presents with  ? Shortness of Breath  ? ? ?Matthew [name] is a 81 y.o. male. ? ?HPI ?Patient is an 81 year old male with history of cardiomyopathy, CAD, hyperlipidemia, who presents to the emergency department due to shortness of breath.  Patient had a recent admission on March 25 and was discharged on March 28, 3 days ago.  He presented initially with acute onset altered mental status as well as shaking chills.  He was treated for UTI with several days of p.o. Levaquin and was diagnosed with dehydration which they believe is likely secondary to Aldactone and was admitted for metabolic encephalopathy.  He has a history of chronic heart failure with an EF around 25%. ? ?Earlier today while watching television he experienced an episode of shortness of breath.  States that his symptoms have been coming and going since they began.  No chest pain, nausea, vomiting.  Does note chills as well as chronic pedal edema.  States that he has been taking all of his prescribed medications. ?  ? ?Home Medications ?Prior to Admission medications   ?Medication Sig Start Date End Date Taking? Authorizing Provider  ?acetaminophen (TYLENOL) 500 MG tablet Take 500 mg by mouth every 6 (six) hours as needed for moderate pain.    [provider]  ?aspirin EC 81 MG tablet Take 1 tablet (81 mg total) by mouth daily. 03/29/17   Jettie Booze, MD  ?atorvastatin (LIPITOR) 10 MG tablet TAKE 1 TABLET(10 MG) BY MOUTH DAILY ?Patient taking differently: Take 10 mg by mouth daily. 07/19/20   Jettie Booze, MD  ?dapagliflozin propanediol (FARXIGA) 10 MG TABS tablet Take 1 tablet (10 mg total) by mouth daily. 07/09/20   Jettie Booze, MD  ?furosemide (LASIX) 20 MG tablet Take 2 tablets (40 mg total) by mouth every morning AND 1 tablet (20 mg total) every  evening. 05/13/21   Clegg, Amy D, NP  ?metoprolol succinate (TOPROL-XL) 25 MG 24 hr tablet Take 1.5 tablets (37.5 mg total) by mouth at bedtime. 05/10/21   Jettie Booze, MD  ?Misc Natural Products [facility]) CAPS Take 1 capsule by mouth in the morning and at bedtime.    [provider]  ?sacubitril-valsartan (ENTRESTO) 24-26 MG Take 1 tablet by mouth 2 (two) times daily. 05/04/21   Jettie Booze, MD  ?spironolactone (ALDACTONE) 25 MG tablet Take 1 tablet (25 mg total) by mouth at bedtime. 04/09/21   Eppie Gibson, MD  ?traMADol (ULTRAM) 50 MG tablet Take 50 mg by mouth every 8 (eight) hours as needed for pain. 04/12/21   [provider]  ?   ? ?Allergies    ?Lactose intolerance (gi)   ? ?Review of Systems   ?Review of Systems  ?All other systems reviewed and are negative. ?Ten systems reviewed and are negative for acute change, except as noted in the HPI.   ?Physical Exam ?Updated Vital Signs ?BP 106/87   Pulse 96   Temp 97.7 ?F (36.5 ?C) (Oral)   Resp (!) 23   SpO2 100%  ?Physical Exam ?Vitals and nursing note reviewed.  ?Constitutional:   ?   General: He is not in acute distress. ?   Appearance: Normal appearance. He is not ill-appearing, toxic-appearing or diaphoretic.  ?   Interventions: He is not intubated. ?HENT:  ?  Head: Normocephalic and atraumatic.  ?   Right Ear: External ear normal.  ?   Left Ear: External ear normal.  ?   Nose: Nose normal.  ?   Mouth/Throat:  ?   Mouth: Mucous membranes are moist.  ?   Pharynx: Oropharynx is clear. No oropharyngeal exudate or posterior oropharyngeal erythema.  ?Eyes:  ?   Extraocular Movements: Extraocular movements intact.  ?Cardiovascular:  ?   Rate and Rhythm: Normal rate and regular rhythm.  ?   Pulses: Normal pulses.  ?   Heart sounds: Normal heart sounds. No murmur heard. ?  No friction rub. No gallop.  ?Pulmonary:  ?   Effort: Pulmonary effort is normal. No tachypnea, bradypnea, accessory muscle usage or respiratory  distress. He is not intubated.  ?   Breath sounds: No stridor. Examination of the right-lower field reveals rales. Examination of the left-lower field reveals rales. Rales present. No decreased breath sounds, wheezing or rhonchi.  ?Abdominal:  ?   General: Abdomen is flat.  ?   Tenderness: There is no abdominal tenderness.  ?Musculoskeletal:     ?   General: Normal range of motion.  ?   Cervical back: Normal range of motion and neck supple. No tenderness.  ?   Right lower leg: No tenderness. Edema present.  ?   Left lower leg: No tenderness. Edema present.  ?   Comments: 2+ pitting edema noted in the right lower extremity with 1+ pitting edema noted in the left lower extremity.  Stage II-III ulcerations noted to the right lower leg and the tibial region.  No significant surrounding erythema noted.  No drainage.  Mild increased warmth noted in the region.  ?Skin: ?   General: Skin is warm and dry.  ?Neurological:  ?   General: No focal deficit present.  ?   Mental Status: He is alert and oriented to person, place, and time.  ?Psychiatric:     ?   Mood and Affect: Mood normal.     ?   Behavior: Behavior normal.  ? ?ED Results / Procedures / Treatments   ?Labs ?(all labs ordered are listed, but only abnormal results are displayed) ?Labs Reviewed  ?BASIC METABOLIC PANEL - Abnormal; Notable for the following components:  ?    Result Value  ? CO2 20 (*)   ? Glucose, Bld 109 (*)   ? Creatinine, Ser 1.42 (*)   ? GFR, Estimated 50 (*)   ? All other components within normal limits  ?CBC - Abnormal; Notable for the following components:  ? Hemoglobin 12.6 (*)   ? HCT 38.9 (*)   ? MCV 79.6 (*)   ? MCH 25.8 (*)   ? RDW 21.5 (*)   ? Platelets 118 (*)   ? All other components within normal limits  ?BRAIN NATRIURETIC PEPTIDE - Abnormal; Notable for the following components:  ? B Natriuretic Peptide >4,500.0 (*)   ? All other components within normal limits  ?RESP PANEL BY RT-PCR (FLU A&B, COVID) ARPGX2  ?HEPARIN LEVEL  (UNFRACTIONATED)  ?TROPONIN I (HIGH SENSITIVITY)  ?TROPONIN I (HIGH SENSITIVITY)  ? ?EKG ?EKG Interpretation ? ?Date/Time:  Friday May 13 2021 21:31:04 EDT ?Ventricular Rate:  102 ?PR Interval:  224 ?QRS Duration: 128 ?QT Interval:  378 ?QTC Calculation: 492 ?R Axis:   19 ?Text Interpretation: Left bundle branch block Right bundle branch block Anteroseptal infarct , age undetermined Abnormal ECG When compared with ECG of 13-May-2021 11:49, PREVIOUS ECG IS PRESENT Confirmed  by Isla Pence [phone]) on 05/13/2021 10:29:47 PM ? ?Radiology ?DG Chest 2 View ? ?Result Date: 05/13/2021 ?CLINICAL DATA:  cp EXAM: CHEST - 2 VIEW COMPARISON:  Chest x-ray 05/07/2021 FINDINGS: Similar-appearing cardiomegaly. The heart and mediastinal contours are unchanged. Aortic calcification. Retrocardiac patchy airspace opacity. No pulmonary edema. Trace left pleural effusion. No pneumothorax. No acute osseous abnormality. IMPRESSION: 1. Retrocardiac patchy airspace opacity. Followup PA and lateral chest X-ray is recommended in 3-4 weeks following trial of antibiotic therapy to ensure resolution and exclude underlying malignancy. 2. Trace left pleural effusion. 3.  Aortic Atherosclerosis (ICD10-I70.0). Electronically Signed   By: Iven Finn M.D.   On: 05/13/2021 23:17  ? ?CT Angio Chest PE W and/or Wo Contrast ? ?Addendum Date: 05/14/2021   ?ADDENDUM REPORT: 05/14/2021 02:57 ADDENDUM: Critical findings were reported to PA Rayna Sexton at 2:55 a.m. Electronically Signed   By: Brett Fairy M.D.   On: 05/14/2021 02:57  ? ?Result Date: 05/14/2021 ?CLINICAL DATA:  Chest pain and shortness of breath. EXAM: CT ANGIOGRAPHY CHEST WITH CONTRAST TECHNIQUE: Multidetector CT imaging of the chest was performed using the standard protocol during bolus administration of intravenous contrast. Multiplanar CT image reconstructions and MIPs were obtained to evaluate the vascular anatomy. RADIATION DOSE REDUCTION: This exam was performed according to the  departmental dose-optimization program which includes automated exposure control, adjustment of the mA and/or kV according to patient size and/or use of iterative reconstruction technique. CONTRAST:  167mL OMNIPAQUE IOHEXOL 35

## 2021-05-13 NOTE — ED Triage Notes (Signed)
Pt BIB EMS with c/o SHOB x 1 hour. Pt states that he was watching tv when it started. EMS states that pt's lasix dose was increased today at PCP office. Pt is NAD at time of triage. ?

## 2021-05-13 NOTE — Progress Notes (Signed)
? ? ?HEART & VASCULAR TRANSITION OF CARE CONSULT NOTE  ? ? ? ?Referring Physician:Dr Candiss Norse ?Primary Care: Establishing  Dr Redmond Pulling in April  ?Primary Cardiologist:Dr [place] ? ?HPI: ?Referred to clinic by Dr Candiss Norse for heart failure consultation.  ? ?Matthew [name] is an 81 year old with h/o NICM/chronic systolic CHF, CAD, HTN, obesity. Has longstanding history of cardiomyopathy. EF previously improved to 50-55% in 2014. Had cath 2012 severe stenosis of the diagonal, which is not severe enough to cause his EF reduction. ? ?Last saw [name] in 03/22. Echo repeat, EF down to 25-30%, RV mildly reduced, mild Matthew. He was referred to PharmD for titration of GDMT, last visit July 2022. ? ?Admitted 03/2021 with a/c CHF. Had not been taking his CHF medications or diuretics several days prior to admission.BNP on admission was >4,500 and CXR showed cardiomegaly with mild pulmonary edema. 2D Echo showed severe dilated biventricular heart failure, LVEF 20 to 25%, RV severely enlarged and severely reduced severe mitral and tricuspid regurgitation and severe BAE. Severely elevated RVSP, 50 mmHg. Cardiology consulted. Diuresed with lasix drip+ metolazone.  Cardiology adjusted his HF meds and he was discharged on Lasix 20mg  BID, Coreg 25mg  BID, Entresto 24/26mg  BID, Spironolactone 25mg  daily, and farxiga 10mg  daily. ? ?Admitted 05/08/21 with AKI and metabolic encephalopathy. Creatinine 1.65. This was felt to be in the setting of recent UTI and spironolactone. Given IV fluids. HF meds restarted. Discharged 05/10/21.  ? ?He presents today with his son. Feels ok. Limited around the house. He gets SOB with exertion. Has good days and bad days. Denies PND/Orthopnea. No chest pain. Having leg swelling. Appetite ok. No fever or chills. Weight at home has been stable. His daughter helps him with medications. Home health following a few days a week. He no longer drives. Taking all medications. ? ?Cardiac Testing  ?Echo 03/2021 LVEF 20-25%. RV  severely reduced. LA/RA severely reduced. Severe Matthew/Moderate TR ?Echo 2022  LVEF 25-30% Mild Matthew  ?Echo 2014 LVEF 50-55% ? ?Review of Systems: [y] = yes, [ ]  = no  ? ?General: Weight gain [ ] ; Weight loss [ ] ; Anorexia [ ] ; Fatigue [ Y]; Fever [ ] ; Chills [ ] ; Weakness [ Y]  ?Cardiac: Chest pain/pressure [ ] ; Resting SOB [ ] ; Exertional SOB [Y ]; Orthopnea [ ] ; Pedal Edema [ Y]; Palpitations [ ] ; Syncope [ ] ; Presyncope [ ] ; Paroxysmal nocturnal dyspnea[ ]   ?Pulmonary: Cough [ ] ; Wheezing[ ] ; Hemoptysis[ ] ; Sputum [ ] ; Snoring [ ]   ?GI: Vomiting[ ] ; Dysphagia[ ] ; Melena[ ] ; Hematochezia [ ] ; Heartburn[ ] ; Abdominal pain [ ] ; Constipation [ ] ; Diarrhea [ ] ; BRBPR [ ]   ?GU: Hematuria[ ] ; Dysuria [ ] ; Nocturia[ ]   ?Vascular: Pain in legs with walking [ ] ; Pain in feet with lying flat [ ] ; Non-healing sores [ ] ; Stroke [ ] ; TIA [ ] ; Slurred speech [ ] ;  ?Neuro: Headaches[ ] ; Vertigo[ ] ; Seizures[ ] ; Paresthesias[ ] ;Blurred vision [ ] ; Diplopia [ ] ; Vision changes [ ]   ?Ortho/Skin: Arthritis [ ] ; Joint pain [ Y]; Muscle pain [ ] ; Joint swelling [ ] ; Back Pain [ Y]; Rash [ ]   ?Psych: Depression[ ] ; Anxiety[ ]   ?Heme: Bleeding problems [ ] ; Clotting disorders [ ] ; Anemia [ ]   ?Endocrine: Diabetes [ ] ; Thyroid dysfunction[ ]  ? ? ?Past Medical History:  ?Diagnosis Date  ? CAD (coronary artery disease)   ? Cardiomyopathy ([place])   ? Heart failure ([place])   ? HTN (hypertension)   ? Obesity   ?  Osteoarthritis   ? ? ?Current Outpatient Medications  ?Medication Sig Dispense Refill  ? acetaminophen (TYLENOL) 500 MG tablet Take 500 mg by mouth every 6 (six) hours as needed for moderate pain.    ? aspirin EC 81 MG tablet Take 1 tablet (81 mg total) by mouth daily. 90 tablet 3  ? atorvastatin (LIPITOR) 10 MG tablet TAKE 1 TABLET(10 MG) BY MOUTH DAILY (Patient taking differently: Take 10 mg by mouth daily.) 90 tablet 3  ? dapagliflozin propanediol (FARXIGA) 10 MG TABS tablet Take 1 tablet (10 mg total) by mouth daily. 90 tablet 3  ?  furosemide (LASIX) 20 MG tablet Take 1 tablet (20 mg total) by mouth 2 (two) times daily. 60 tablet 2  ? metoprolol succinate (TOPROL-XL) 25 MG 24 hr tablet Take 1.5 tablets (37.5 mg total) by mouth at bedtime. 135 tablet 0  ? Misc Natural Products (PROSTATE HEALTH) CAPS Take 1 capsule by mouth in the morning and at bedtime.    ? sacubitril-valsartan (ENTRESTO) 24-26 MG Take 1 tablet by mouth 2 (two) times daily. 60 tablet 2  ? spironolactone (ALDACTONE) 25 MG tablet Take 1 tablet (25 mg total) by mouth at bedtime. 30 tablet 0  ? traMADol (ULTRAM) 50 MG tablet Take 50 mg by mouth every 8 (eight) hours as needed for pain.    ? ?No current facility-administered medications for this encounter.  ? ? ?Allergies  ?Allergen Reactions  ? Lactose Intolerance (Gi) Other (See Comments)  ?  unknown  ? ? ?  ?Social History  ? ?Socioeconomic History  ? Marital status: Unknown  ?  Spouse name: Not on file  ? Number of children: Not on file  ? Years of education: Not on file  ? Highest education level: 11th grade  ?Occupational History  ? Occupation: retired  ?Tobacco Use  ? Smoking status: Never  ? Smokeless tobacco: Never  ?Vaping Use  ? Vaping Use: Never used  ?Substance and Sexual Activity  ? Alcohol use: No  ? Drug use: No  ? Sexual activity: Not on file  ?Other Topics Concern  ? Not on file  ?Social History Narrative  ? Not on file  ? ?Social Determinants of Health  ? ?Financial Resource Strain: Low Risk   ? Difficulty of Paying Living Expenses: Not very hard  ?Food Insecurity: No Food Insecurity  ? Worried About Charity fundraiser in the Last Year: Never true  ? Ran Out of Food in the Last Year: Never true  ?Transportation Needs: No Transportation Needs  ? Lack of Transportation (Medical): No  ? Lack of Transportation (Non-Medical): No  ?Physical Activity: Not on file  ?Stress: Not on file  ?Social Connections: Not on file  ?Intimate Partner Violence: Not on file  ? ? ?  ?Family History  ?Problem Relation Age of Onset  ?  Hypertension Father   ? Diabetes Brother   ? Heart attack Neg Hx   ? Stroke Neg Hx   ? ? ?Vitals:  ? 05/13/21 1111  ?BP: 106/80  ?Pulse: 99  ?SpO2: 100%  ?Weight: 106.2 kg  ? ? ?PHYSICAL EXAM: ?General:  Elderly arrived in wheel chair.  No respiratory difficulty ?HEENT: normal ?Neck: supple. JVP 8-9. Carotids 2+ bilat; no bruits. No lymphadenopathy or thryomegaly appreciated. ?Cor: PMI nondisplaced. Regular rate & rhythm. No rubs, gallops . LLSB/ [place]pex 3/6 . ?Lungs: clear ?Abdomen: soft, nontender, nondistended. No hepatosplenomegaly. No bruits or masses. Good bowel sounds. ?Extremities: no cyanosis, clubbing, rash, R and  LLE 1+ edema. LLE dressing  ?Neuro: alert & oriented x 3, cranial nerves grossly intact. moves all 4 extremities w/o difficulty. Affect pleasant. ? ?ECG:SR with occasional PVCs QRS 130 ms  ? ? ?ASSESSMENT & PLAN: ?1. Chronic Biventricular HF  ?-Echo 04/04/21 EF LVEF 20-25%, RV moderately reduced, LA/RA severely dilated, and severe Matthew. Previous ECHO 04/2020 EF 25-30% with mild Matthew.  Most recent cath 2012.  ?- Given worsening biventricular function and worsening valvular disease will need repeat cath /TEE to help sort out.  ?NYHA III. Volume status elevated. Increase lasix to 40 mg in am and continue lasix 20 mg in pm.  ?- Continue Toprol XL 37.5 mg daily at bedtime.  ?- Continue entresto 24-26 mg twice a day  ?- Continue spiro 25 mg daily MRA ?-Continue farxiga. If has another UTI will need to stop.  ?- Check BMET today  ? ?2. CAD ?- Cath 2012- severe stenosis of the diagonal ?- No chest pain.  ?- Set up repeat cath to reassess coronaries given reduced EF . ?- Check precath labs.  ? ?3. Mitral Regurgitation ?-Recent Echo 03/2021 severe Matthew.  ?-Will need TEE to further assess. ? If needs clip  ? ?4. Hyperlipidemia ?On statin.  ? ?Set up RHC/LHC and TEE in the next few weeks. Dr Haroldine Laws reviewed and discussed options with him. He would like to pursue cath/TEE. Benefits/risk discussed.  ? ?Referred to HFSW  (PCP, Medications, Transportation, ETOH Abuse, Drug Abuse, Insurance, Financial ):  No ?Refer to Pharmacy:  No ?Refer to Home Health: No ?Refer to Advanced Heart Failure Clinic: Yes - Dr Haroldine Laws  ?Clinical cytogeneticist

## 2021-05-13 NOTE — ED Provider Triage Note (Signed)
Emergency Medicine Provider Triage Evaluation Note ? ?Matthew [name] , a 81 y.o. male  was evaluated in triage.  Pt complains of chest pain and shortness of breath that began 1 hour ago.  Patient states that he was seen at his doctor earlier today for the same complaint, however states that the chest pain began 1 hour ago.  Patient states that he was recently discharged from hospital due to "shortness of breath".  On chart review, it seems the patient was discharged from facility on 3/28 due to metabolic encephalopathy.  Patient continues that he was sitting watching TV when the chest pain began.  It is described as centrally located, nonexertional, nonradiating. ?Review of Systems  ?Positive: Chest pain, shortness of breath ?Negative: Nausea, vomiting, fevers, diarrhea, abdominal pain, headache ? ?Physical Exam  ?BP 117/88 (BP Location: Left Arm)   Pulse (!) 105   Temp 97.7 ?F (36.5 ?C) (Oral)   Resp 17   SpO2 100%  ?Gen:   Awake, no distress   ?Resp:  Normal effort  ?MSK:   Moves extremities without difficulty  ?Other:  Clear to auscultation bilaterally ? ?Medical Decision Making  ?Medically screening exam initiated at 9:39 PM.  Appropriate orders placed.  Matthew [name] was informed that the remainder of the evaluation will be completed by another provider, this initial triage assessment does not replace that evaluation, and the importance of remaining in the ED until their evaluation is complete. ? ? ?  ?Matthew Decant, PA-C ?05/13/21 2141 ? ?

## 2021-05-13 NOTE — Patient Instructions (Addendum)
EKG done today. ? ?Labs done today. We will contact you only if your labs are abnormal. ? ?INCREASE Lasix to 40mg  (2 tablets) by mouth every morning and 20mg  (1 tablet) by mouth every evening.  ? ?No other medication changes were made. Please continue all current medications as prescribed. ? ?Your physician recommends that you schedule a follow-up appointment in: 6-8 weeks with Dr. Haroldine Laws ? ?If you have any questions or concerns before your next appointment please send [place] a message through [place] or call our office at [phone].   ? ?TO LEAVE A MESSAGE FOR THE NURSE SELECT OPTION 2, PLEASE LEAVE A MESSAGE INCLUDING: ?YOUR NAME ?DATE OF BIRTH ?CALL BACK NUMBER ?REASON FOR CALL**this is important as we prioritize the call backs ? ?YOU WILL RECEIVE A CALL BACK THE SAME DAY AS LONG AS YOU CALL BEFORE 4:00 PM ? ? ?Do the following things EVERYDAY: ?Weigh yourself in the morning before breakfast. Write it down and keep it in a log. ?Take your medicines as prescribed ?Eat low salt foods--Limit salt (sodium) to 2000 mg per day.  ?Stay as active as you can everyday ?Limit all fluids for the day to less than 2 liters ? ? ?Please be sure to bring in all your medications bottles to every appointment.  ? ? ?You are scheduled for a Cardiac Catheterization & TEE on Friday, April 14 with Dr. Glori Bickers. ? ?1. Please arrive at the Main Entrance A at [facility]: [place], [place] [address] at 8:00 AM . Free valet parking service is available.  ? ?Special note: Every effort is made to have your procedure done on time. Please understand that emergencies sometimes delay scheduled procedures. ? ?2. Diet: Do not eat solid foods after midnight.  You may have clear liquids until 5 AM upon the day of the procedure. ? ?3. Medication instructions in preparation for your procedure: DO NOT take Lasix the morning of your procedure ? ?Do not take Diabetes Med [place] on the day of the procedure and HOLD [place]. ? ?On the morning of your procedure, take Aspirin and any morning medicines NOT listed above.  You may use sips of water. ? ?4. Plan to go home the same day, you will only stay overnight if medically necessary. ?5. You MUST have a responsible adult to drive you home. ?6. An adult MUST be with you the first 24 hours after you arrive home. ?7. Bring a current list of your medications, and the last time and date medication taken. ?8. Bring ID and current insurance cards. ?9.Please wear clothes that are easy to get on and off and wear slip-on shoes. ? ?Thank you for allowing [place] to care for you! ?  -- [facility] ? ? ? ? ?

## 2021-05-14 ENCOUNTER — Inpatient Hospital Stay (HOSPITAL_COMMUNITY): Payer: Medicare Other

## 2021-05-14 ENCOUNTER — Emergency Department (HOSPITAL_COMMUNITY): Payer: Medicare Other

## 2021-05-14 ENCOUNTER — Encounter (HOSPITAL_COMMUNITY): Payer: Self-pay | Admitting: Internal Medicine

## 2021-05-14 DIAGNOSIS — M159 Polyosteoarthritis, unspecified: Secondary | ICD-10-CM | POA: Diagnosis present

## 2021-05-14 DIAGNOSIS — D696 Thrombocytopenia, unspecified: Secondary | ICD-10-CM | POA: Diagnosis not present

## 2021-05-14 DIAGNOSIS — H9190 Unspecified hearing loss, unspecified ear: Secondary | ICD-10-CM | POA: Diagnosis present

## 2021-05-14 DIAGNOSIS — I7121 Aneurysm of the ascending aorta, without rupture: Secondary | ICD-10-CM | POA: Diagnosis present

## 2021-05-14 DIAGNOSIS — I5043 Acute on chronic combined systolic (congestive) and diastolic (congestive) heart failure: Secondary | ICD-10-CM

## 2021-05-14 DIAGNOSIS — E875 Hyperkalemia: Secondary | ICD-10-CM | POA: Diagnosis not present

## 2021-05-14 DIAGNOSIS — M7989 Other specified soft tissue disorders: Secondary | ICD-10-CM

## 2021-05-14 DIAGNOSIS — I5082 Biventricular heart failure: Secondary | ICD-10-CM | POA: Diagnosis present

## 2021-05-14 DIAGNOSIS — I130 Hypertensive heart and chronic kidney disease with heart failure and stage 1 through stage 4 chronic kidney disease, or unspecified chronic kidney disease: Secondary | ICD-10-CM | POA: Diagnosis present

## 2021-05-14 DIAGNOSIS — N400 Enlarged prostate without lower urinary tract symptoms: Secondary | ICD-10-CM | POA: Diagnosis present

## 2021-05-14 DIAGNOSIS — K219 Gastro-esophageal reflux disease without esophagitis: Secondary | ICD-10-CM | POA: Diagnosis present

## 2021-05-14 DIAGNOSIS — I340 Nonrheumatic mitral (valve) insufficiency: Secondary | ICD-10-CM | POA: Diagnosis not present

## 2021-05-14 DIAGNOSIS — R0602 Shortness of breath: Secondary | ICD-10-CM | POA: Diagnosis not present

## 2021-05-14 DIAGNOSIS — I509 Heart failure, unspecified: Secondary | ICD-10-CM | POA: Diagnosis not present

## 2021-05-14 DIAGNOSIS — I2609 Other pulmonary embolism with acute cor pulmonale: Secondary | ICD-10-CM | POA: Diagnosis not present

## 2021-05-14 DIAGNOSIS — R070 Pain in throat: Secondary | ICD-10-CM | POA: Diagnosis not present

## 2021-05-14 DIAGNOSIS — E43 Unspecified severe protein-calorie malnutrition: Secondary | ICD-10-CM | POA: Diagnosis not present

## 2021-05-14 DIAGNOSIS — N189 Chronic kidney disease, unspecified: Secondary | ICD-10-CM | POA: Diagnosis not present

## 2021-05-14 DIAGNOSIS — R64 Cachexia: Secondary | ICD-10-CM | POA: Diagnosis not present

## 2021-05-14 DIAGNOSIS — Z006 Encounter for examination for normal comparison and control in clinical research program: Secondary | ICD-10-CM | POA: Diagnosis not present

## 2021-05-14 DIAGNOSIS — B9620 Unspecified Escherichia coli [E. coli] as the cause of diseases classified elsewhere: Secondary | ICD-10-CM | POA: Diagnosis not present

## 2021-05-14 DIAGNOSIS — I110 Hypertensive heart disease with heart failure: Secondary | ICD-10-CM | POA: Diagnosis not present

## 2021-05-14 DIAGNOSIS — N1832 Chronic kidney disease, stage 3b: Secondary | ICD-10-CM | POA: Diagnosis present

## 2021-05-14 DIAGNOSIS — R4189 Other symptoms and signs involving cognitive functions and awareness: Secondary | ICD-10-CM | POA: Diagnosis present

## 2021-05-14 DIAGNOSIS — Z20822 Contact with and (suspected) exposure to covid-19: Secondary | ICD-10-CM | POA: Diagnosis not present

## 2021-05-14 DIAGNOSIS — I5022 Chronic systolic (congestive) heart failure: Secondary | ICD-10-CM | POA: Diagnosis present

## 2021-05-14 DIAGNOSIS — L89152 Pressure ulcer of sacral region, stage 2: Secondary | ICD-10-CM | POA: Diagnosis present

## 2021-05-14 DIAGNOSIS — N179 Acute kidney failure, unspecified: Secondary | ICD-10-CM | POA: Diagnosis not present

## 2021-05-14 DIAGNOSIS — I428 Other cardiomyopathies: Secondary | ICD-10-CM | POA: Diagnosis not present

## 2021-05-14 DIAGNOSIS — Z954 Presence of other heart-valve replacement: Secondary | ICD-10-CM | POA: Diagnosis not present

## 2021-05-14 DIAGNOSIS — Z681 Body mass index (BMI) 19 or less, adult: Secondary | ICD-10-CM | POA: Diagnosis not present

## 2021-05-14 DIAGNOSIS — I2510 Atherosclerotic heart disease of native coronary artery without angina pectoris: Secondary | ICD-10-CM | POA: Diagnosis present

## 2021-05-14 DIAGNOSIS — S81801D Unspecified open wound, right lower leg, subsequent encounter: Secondary | ICD-10-CM | POA: Diagnosis not present

## 2021-05-14 DIAGNOSIS — L97819 Non-pressure chronic ulcer of other part of right lower leg with unspecified severity: Secondary | ICD-10-CM | POA: Diagnosis present

## 2021-05-14 DIAGNOSIS — I5021 Acute systolic (congestive) heart failure: Secondary | ICD-10-CM | POA: Diagnosis not present

## 2021-05-14 DIAGNOSIS — I472 Ventricular tachycardia, unspecified: Secondary | ICD-10-CM | POA: Diagnosis present

## 2021-05-14 DIAGNOSIS — I361 Nonrheumatic tricuspid (valve) insufficiency: Secondary | ICD-10-CM | POA: Diagnosis not present

## 2021-05-14 DIAGNOSIS — E669 Obesity, unspecified: Secondary | ICD-10-CM | POA: Diagnosis present

## 2021-05-14 DIAGNOSIS — E785 Hyperlipidemia, unspecified: Secondary | ICD-10-CM | POA: Diagnosis present

## 2021-05-14 DIAGNOSIS — E871 Hypo-osmolality and hyponatremia: Secondary | ICD-10-CM | POA: Diagnosis not present

## 2021-05-14 DIAGNOSIS — I452 Bifascicular block: Secondary | ICD-10-CM | POA: Diagnosis present

## 2021-05-14 DIAGNOSIS — I5023 Acute on chronic systolic (congestive) heart failure: Secondary | ICD-10-CM | POA: Diagnosis not present

## 2021-05-14 DIAGNOSIS — I083 Combined rheumatic disorders of mitral, aortic and tricuspid valves: Secondary | ICD-10-CM | POA: Diagnosis present

## 2021-05-14 DIAGNOSIS — I088 Other rheumatic multiple valve diseases: Secondary | ICD-10-CM | POA: Diagnosis not present

## 2021-05-14 DIAGNOSIS — I959 Hypotension, unspecified: Secondary | ICD-10-CM | POA: Diagnosis not present

## 2021-05-14 DIAGNOSIS — L0390 Cellulitis, unspecified: Secondary | ICD-10-CM | POA: Diagnosis not present

## 2021-05-14 DIAGNOSIS — N390 Urinary tract infection, site not specified: Secondary | ICD-10-CM | POA: Diagnosis not present

## 2021-05-14 DIAGNOSIS — J9811 Atelectasis: Secondary | ICD-10-CM | POA: Diagnosis present

## 2021-05-14 DIAGNOSIS — R079 Chest pain, unspecified: Secondary | ICD-10-CM | POA: Diagnosis present

## 2021-05-14 DIAGNOSIS — I2601 Septic pulmonary embolism with acute cor pulmonale: Secondary | ICD-10-CM | POA: Diagnosis not present

## 2021-05-14 DIAGNOSIS — I081 Rheumatic disorders of both mitral and tricuspid valves: Secondary | ICD-10-CM | POA: Diagnosis present

## 2021-05-14 DIAGNOSIS — I2699 Other pulmonary embolism without acute cor pulmonale: Secondary | ICD-10-CM | POA: Diagnosis present

## 2021-05-14 DIAGNOSIS — I493 Ventricular premature depolarization: Secondary | ICD-10-CM | POA: Diagnosis present

## 2021-05-14 DIAGNOSIS — N1831 Chronic kidney disease, stage 3a: Secondary | ICD-10-CM | POA: Diagnosis not present

## 2021-05-14 DIAGNOSIS — E872 Acidosis, unspecified: Secondary | ICD-10-CM | POA: Diagnosis not present

## 2021-05-14 DIAGNOSIS — R5381 Other malaise: Secondary | ICD-10-CM | POA: Diagnosis present

## 2021-05-14 LAB — ECHOCARDIOGRAM COMPLETE
Height: 79 in
MV M vel: 4.46 m/s
MV Peak grad: 79.6 mmHg
Radius: 1 cm
S' Lateral: 6.2 cm
Weight: 3523.83 oz

## 2021-05-14 LAB — TROPONIN I (HIGH SENSITIVITY): Troponin I (High Sensitivity): 12 ng/L (ref <18)

## 2021-05-14 LAB — RESP PANEL BY RT-PCR (FLU A&B, COVID) ARPGX2
Influenza A by PCR: NEGATIVE
Influenza B by PCR: NEGATIVE
SARS Coronavirus 2 by RT PCR: NEGATIVE

## 2021-05-14 LAB — BASIC METABOLIC PANEL
Anion gap: 9 (ref 5–15)
BUN: 21 mg/dL (ref 8–23)
CO2: 22 mmol/L (ref 22–32)
Calcium: 8.7 mg/dL — ABNORMAL LOW (ref 8.9–10.3)
Chloride: 103 mmol/L (ref 98–111)
Creatinine, Ser: 1.47 mg/dL — ABNORMAL HIGH (ref 0.61–1.24)
GFR, Estimated: 48 mL/min — ABNORMAL LOW (ref >60)
Glucose, Bld: 111 mg/dL — ABNORMAL HIGH (ref 70–99)
Potassium: 4.5 mmol/L (ref 3.5–5.1)
Sodium: 134 mmol/L — ABNORMAL LOW (ref 135–145)

## 2021-05-14 LAB — HEPARIN LEVEL (UNFRACTIONATED)
Heparin Unfractionated: 0.51 IU/mL (ref 0.30–0.70)
Heparin Unfractionated: 0.54 IU/mL (ref 0.30–0.70)

## 2021-05-14 LAB — MAGNESIUM: Magnesium: 1.8 mg/dL (ref 1.7–2.4)

## 2021-05-14 LAB — LACTIC ACID, PLASMA: Lactic Acid, Venous: 1.6 mmol/L (ref 0.5–1.9)

## 2021-05-14 IMAGING — CT CT ANGIO CHEST
2 of 6 series · 15 of 36 positions shown · IV contrast (agent unspecified)
Comparison: None.
COMPARISON: None.

Addendum:
CLINICAL DATA: Chest pain and shortness of breath.

EXAM:
CT ANGIOGRAPHY CHEST WITH CONTRAST
TECHNIQUE: Multidetector CT imaging of the chest was performed using the
standard protocol during bolus administration of intravenous
contrast. Multiplanar CT image reconstructions and MIPs were
obtained to evaluate the vascular anatomy.

[Series 7: pe thins · axial · 0.86mm/px · z∈[-242,+32]mm · 14 of 435 slices shown]
[im 22/435  lung]
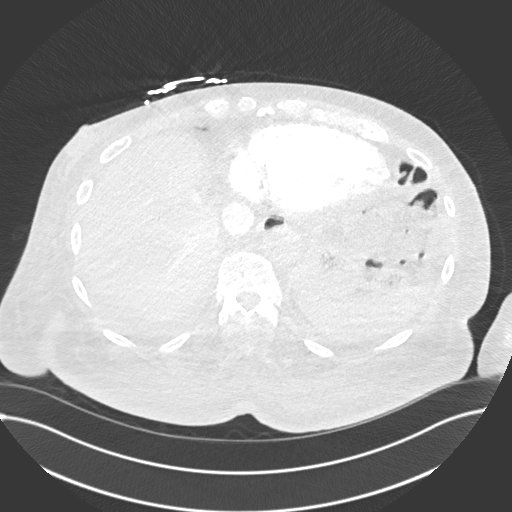
[im 66/435  mediastinal]
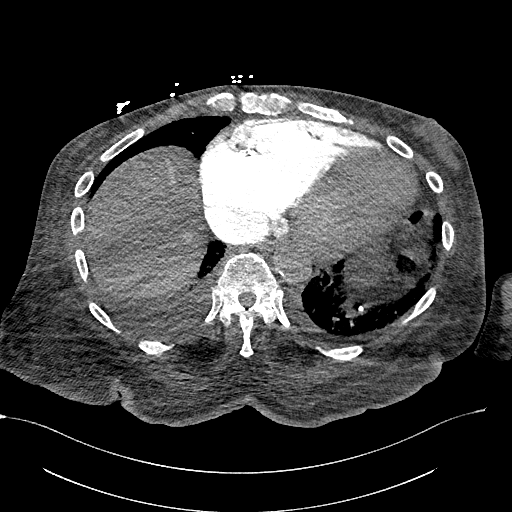
[im 87/435  lung]
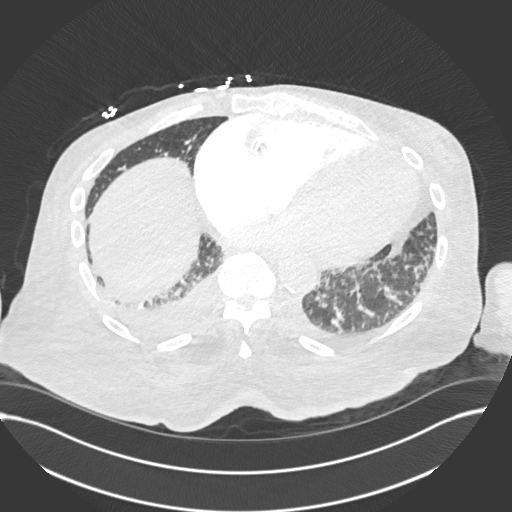
[im 109/435  mediastinal]
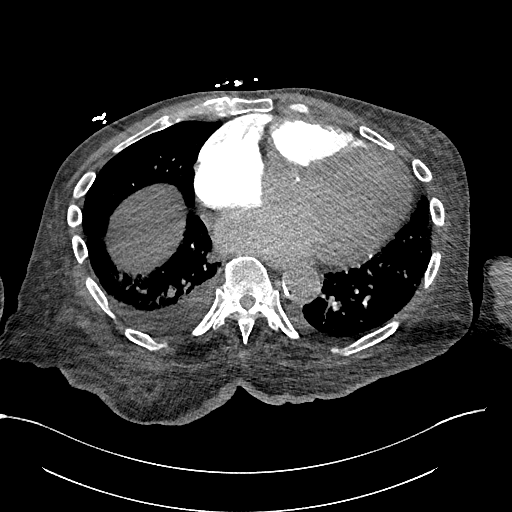
[im 152/435  lung]
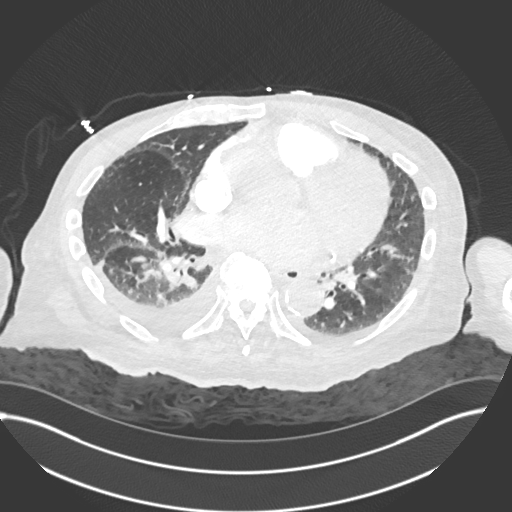
[im 174/435  mediastinal]
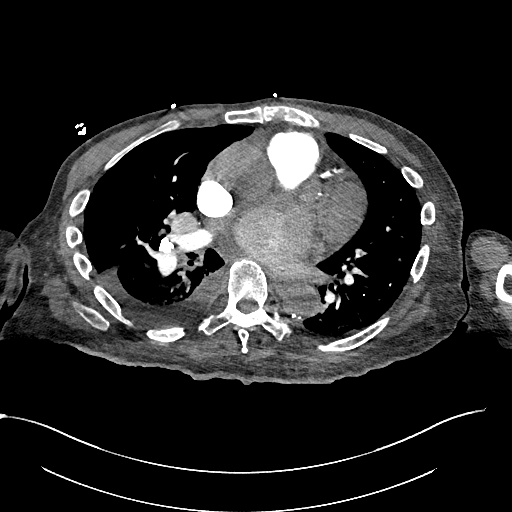
[im 196/435  lung]
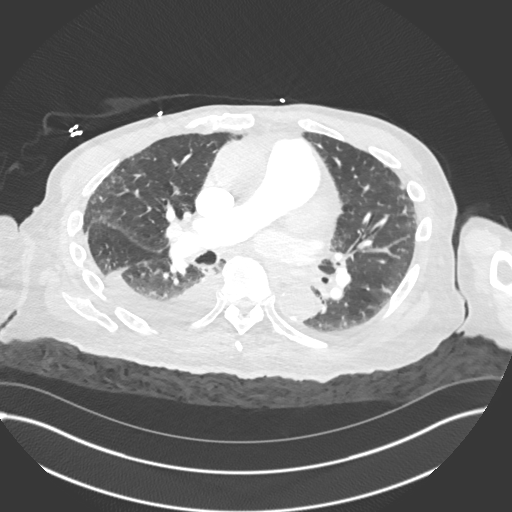
[im 239/435  mediastinal]
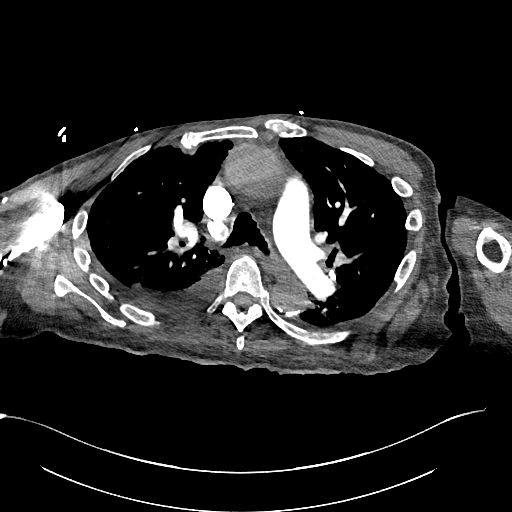
[im 261/435  lung]
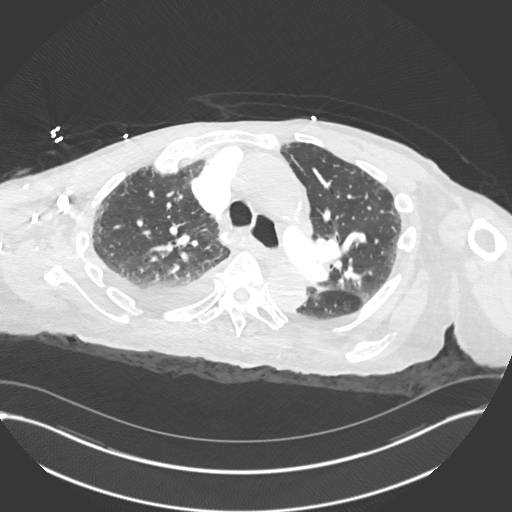
[im 283/435  mediastinal]
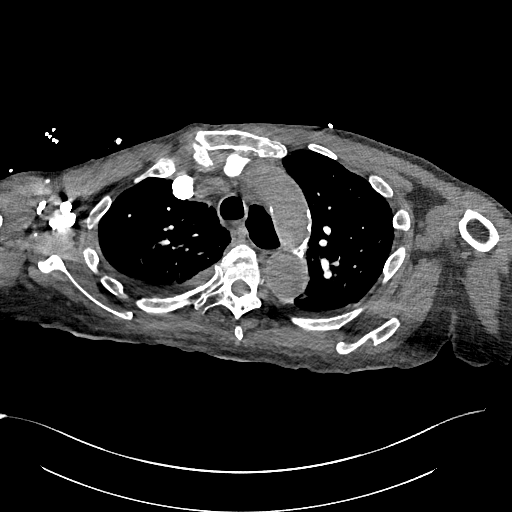
[im 326/435  lung]
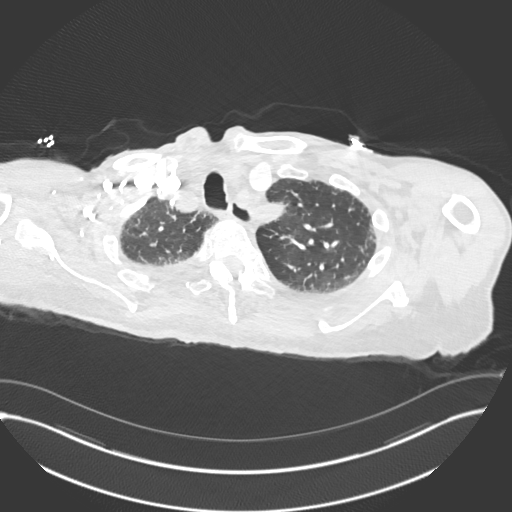
[im 348/435  mediastinal]
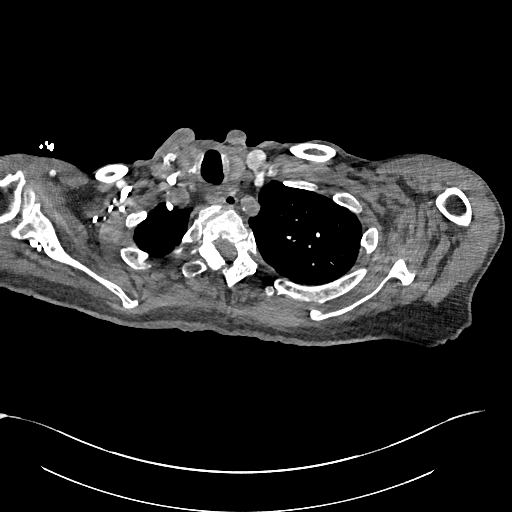
[im 369/435  lung]
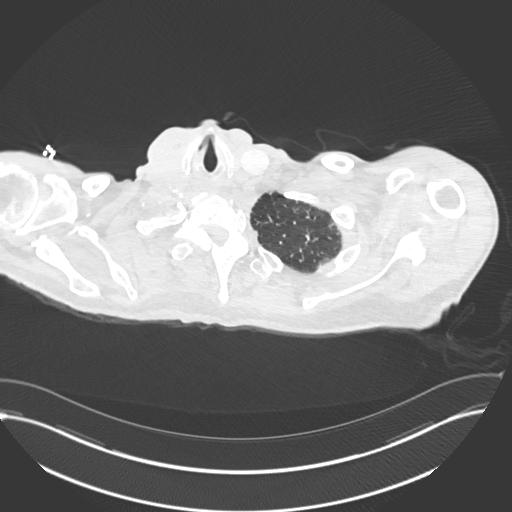
[im 413/435  mediastinal]
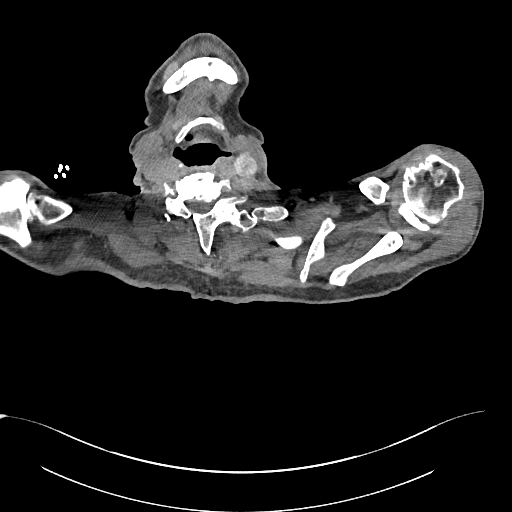

[Series 8: pe 2mm cor · coronal · 0.59mm/px · 1 of 151 slices shown]
[im 76/151  mediastinal]
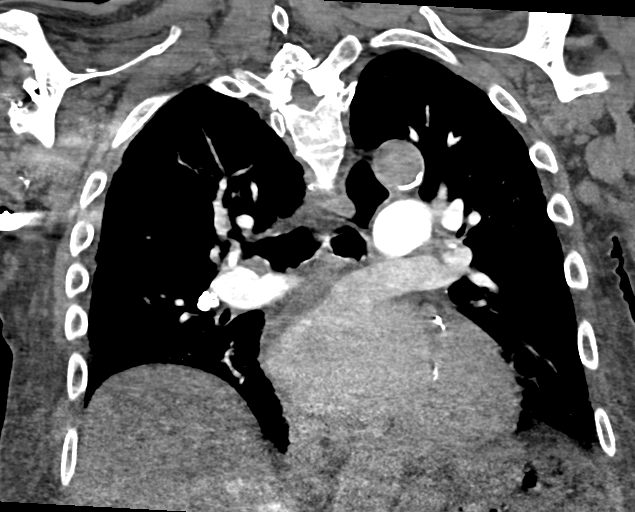

[15 of 36 positions shown; findings below may reference images not displayed]

RADIATION DOSE REDUCTION: This exam was performed according to the
departmental dose-optimization program which includes automated
exposure control, adjustment of the mA and/or kV according to
patient size and/or use of iterative reconstruction technique.

CONTRAST:  100mL OMNIPAQUE IOHEXOL 350 MG/ML SOLN
FINDINGS: Cardiovascular: The heart is enlarged and multi-vessel coronary
artery calcifications are noted. There is atherosclerotic
calcification of the aorta with aneurysmal dilatation of the
ascending aorta measuring 4.5 cm. The pulmonary trunk is distended
which may be associated with underlying pulmonary artery
hypertension. Pulmonary artery filling defects are present in the
segmental and subsegmental arteries in the right lower lobe.
Evaluation of the subsegmental arteries at the left lung base is
limited due to respiratory motion artifact. The right ventricle is
distended suggesting underlying right heart strain.

Mediastinum/Nodes: Calcified lymph nodes are present in the
mediastinum and hilar regions bilaterally. No axillary
lymphadenopathy. The thyroid gland is heterogeneous with a few
subcentimeter hypodensities in the left lobe. The trachea and
esophagus are within normal limits.

Lungs/Pleura: There are small bilateral pleural effusions, greater
on the right than on the left. There is mild atelectasis at the lung
bases. Mild fibrotic changes are noted in the lungs bilaterally. No
pneumothorax. Calcified granuloma are noted bilaterally.

Upper Abdomen: There is reflux of contrast into the inferior vena
cava and hepatic veins suggesting right heart failure.

Musculoskeletal: Gynecomastia and mild anasarca is noted.
Degenerative changes in the thoracic spine. No acute osseous
abnormality.

Review of the MIP images confirms the above findings.
IMPRESSION: 1. Segmental and subsegmental pulmonary artery filling defects in
the right lower lobe. The right ventricle is distended which may be
associated with underlying right heart strain. There is also reflux
of contrast into the inferior vena cava and hepatic veins which may
be associated with right heart failure.
2. Small bilateral pleural effusions with atelectasis at the lung
bases
3. Distended pulmonary trunk suggesting underlying pulmonary artery
hypertension.
4. Aortic atherosclerosis with aneurysmal dilatation of the
ascending aorta measuring 4.5 cm. Ascending thoracic aortic
aneurysm. Recommend semi-annual imaging followup by CTA or MRA and
referral to cardiothoracic surgery if not already obtained. This
recommendation follows [WJ]
ACCF/AHA/AATS/ACR/ASA/SCA/LUCRETIA/LUCRETIA/LUCRETIA/LUCRETIA Guidelines for the
Diagnosis and Management of Patients With Thoracic Aortic Disease.
Circulation. [WJ]; 121: [id number]. Aortic aneurysm NOS ([WJ]-[WJ])
5. Cardiomegaly with coronary artery calcifications.

ADDENDUM:
Critical findings were reported to [facility] at [DATE] a.m.

*** End of Addendum ***
RADIATION DOSE REDUCTION: This exam was performed according to the
departmental dose-optimization program which includes automated
exposure control, adjustment of the mA and/or kV according to
patient size and/or use of iterative reconstruction technique.

CONTRAST:  100mL OMNIPAQUE IOHEXOL 350 MG/ML SOLN
FINDINGS: Cardiovascular: The heart is enlarged and multi-vessel coronary
artery calcifications are noted. There is atherosclerotic
calcification of the aorta with aneurysmal dilatation of the
ascending aorta measuring 4.5 cm. The pulmonary trunk is distended
which may be associated with underlying pulmonary artery
hypertension. Pulmonary artery filling defects are present in the
segmental and subsegmental arteries in the right lower lobe.
Evaluation of the subsegmental arteries at the left lung base is
limited due to respiratory motion artifact. The right ventricle is
distended suggesting underlying right heart strain.

Mediastinum/Nodes: Calcified lymph nodes are present in the
mediastinum and hilar regions bilaterally. No axillary
lymphadenopathy. The thyroid gland is heterogeneous with a few
subcentimeter hypodensities in the left lobe. The trachea and
esophagus are within normal limits.

Lungs/Pleura: There are small bilateral pleural effusions, greater
on the right than on the left. There is mild atelectasis at the lung
bases. Mild fibrotic changes are noted in the lungs bilaterally. No
pneumothorax. Calcified granuloma are noted bilaterally.

Upper Abdomen: There is reflux of contrast into the inferior vena
cava and hepatic veins suggesting right heart failure.

Musculoskeletal: Gynecomastia and mild anasarca is noted.
Degenerative changes in the thoracic spine. No acute osseous
abnormality.

Review of the MIP images confirms the above findings.
IMPRESSION: 1. Segmental and subsegmental pulmonary artery filling defects in
the right lower lobe. The right ventricle is distended which may be
associated with underlying right heart strain. There is also reflux
of contrast into the inferior vena cava and hepatic veins which may
be associated with right heart failure.
2. Small bilateral pleural effusions with atelectasis at the lung
bases
3. Distended pulmonary trunk suggesting underlying pulmonary artery
hypertension.
4. Aortic atherosclerosis with aneurysmal dilatation of the
ascending aorta measuring 4.5 cm. Ascending thoracic aortic
aneurysm. Recommend semi-annual imaging followup by CTA or MRA and
referral to cardiothoracic surgery if not already obtained. This
recommendation follows [WJ]
ACCF/AHA/AATS/ACR/ASA/SCA/LUCRETIA/LUCRETIA/LUCRETIA/LUCRETIA Guidelines for the
Diagnosis and Management of Patients With Thoracic Aortic Disease.
Circulation. [WJ]; 121: [id number]. Aortic aneurysm NOS ([WJ]-[WJ])
5. Cardiomegaly with coronary artery calcifications.

## 2021-05-14 MED ORDER — FUROSEMIDE 10 MG/ML IJ SOLN
40.0000 mg | Freq: Once | INTRAMUSCULAR | Status: AC
Start: 2021-05-14 — End: 2021-05-14
  Administered 2021-05-14: 40 mg via INTRAVENOUS
  Filled 2021-05-14: qty 4

## 2021-05-14 MED ORDER — IOHEXOL 350 MG/ML SOLN
100.0000 mL | Freq: Once | INTRAVENOUS | Status: AC | PRN
  Administered 2021-05-14: 100 mL via INTRAVENOUS

## 2021-05-14 MED ORDER — ACETAMINOPHEN 650 MG RE SUPP: 650.0000 mg | Freq: Four times a day (QID) | RECTAL | Status: DC | PRN

## 2021-05-14 MED ORDER — FUROSEMIDE 10 MG/ML IJ SOLN
40.0000 mg | Freq: Two times a day (BID) | INTRAMUSCULAR | Status: DC
  Administered 2021-05-14 – 2021-05-15 (×2): 40 mg via INTRAVENOUS
  Filled 2021-05-14 (×2): qty 4

## 2021-05-14 MED ORDER — FUROSEMIDE 10 MG/ML IJ SOLN: 40.0000 mg | Freq: Two times a day (BID) | INTRAMUSCULAR | Status: DC

## 2021-05-14 MED ORDER — HEPARIN BOLUS VIA INFUSION
6000.0000 [IU] | Freq: Once | INTRAVENOUS | Status: AC
  Administered 2021-05-14: 6000 [IU] via INTRAVENOUS
  Filled 2021-05-14: qty 6000

## 2021-05-14 MED ORDER — ACETAMINOPHEN 325 MG PO TABS: 650.0000 mg | ORAL_TABLET | Freq: Four times a day (QID) | ORAL | Status: DC | PRN

## 2021-05-14 MED ORDER — HEPARIN (PORCINE) 25000 UT/250ML-% IV SOLN
1800.0000 [IU]/h | INTRAVENOUS | Status: AC
  Administered 2021-05-14 – 2021-05-15 (×3): 1800 [IU]/h via INTRAVENOUS
  Filled 2021-05-14 (×3): qty 250

## 2021-05-14 MED ORDER — MAGNESIUM OXIDE -MG SUPPLEMENT 400 (240 MG) MG PO TABS
400.0000 mg | ORAL_TABLET | Freq: Every day | ORAL | Status: DC
  Administered 2021-05-14 – 2021-06-01 (×19): 400 mg via ORAL
  Filled 2021-05-14 (×19): qty 1

## 2021-05-14 MED ORDER — PERFLUTREN LIPID MICROSPHERE
1.0000 mL | INTRAVENOUS | Status: AC | PRN
  Administered 2021-05-14: 2 mL via INTRAVENOUS
  Filled 2021-05-14: qty 10

## 2021-05-14 NOTE — Assessment & Plan Note (Addendum)
CT showing aneurysmal dilation of ascending aorta measuring 4.5 cm.  ?-Radiologist recommending semi-annual imaging followup by CTA or MRA ?-Outpatient cardiothoracic surgery follow-up ?

## 2021-05-14 NOTE — ED Notes (Signed)
6E called and asked to initiate purple man  °

## 2021-05-14 NOTE — Progress Notes (Signed)
Paged admitting to notify admitting physician that pt is on unit from ED. ?

## 2021-05-14 NOTE — Progress Notes (Signed)
ANTICOAGULATION CONSULT NOTE - Follow Up Consult ? ?Pharmacy Consult for Heparin ?Indication: pulmonary embolus ? ?Allergies  ?Allergen Reactions  ? Lactose Intolerance (Gi) Other (See Comments)  ?  unknown  ? ? ?Patient Measurements: ?Height: 6\' 7"  (200.7 cm) ?Weight: 99.9 kg (220 lb 3.8 oz) ?IBW/kg (Calculated) : 93.7 ?Heparin Dosing Weight: 99.9 kg ? ?Vital Signs: ?Temp: 97.6 ?F (36.4 ?C) (04/01 1419) ?Temp Source: Oral (04/01 1419) ?BP: 93/67 (04/01 1419) ?Pulse Rate: 95 (04/01 1419) ? ?Labs: ?Recent Labs  ?  05/13/21 ?1223 05/13/21 ?2142 05/14/21 ?0005 05/14/21 ?1418  ?HGB 12.9* 12.6*  --   --   ?HCT 41.1 38.9*  --   --   ?PLT 104* 118*  --   --   ?HEPARINUNFRC  --   --   --  0.51  ?CREATININE 1.42* 1.42*  --   --   ?TROPONINIHS  --  14 12  --   ? ? ?Estimated Creatinine Clearance: 55 mL/min (A) (by C-G formula based on SCr of 1.42 mg/dL (H)). ? ? ?Medications:  ?Scheduled:  ?Infusions:  ? heparin 1,800 Units/hr (05/14/21 0443)  ? ? ?Assessment: ?81 yo M with extensive PMH found to have PE. No anticoagulation PTA. Pharmacy consulted for heparin dosing.  ? ?Heparin level today is therapeutic at 0.51, on 1800 units/hr. Hgb 12.6, plt 118. No line issues or signs/symptoms of bleeding noted. ? ?Goal of Therapy:  ?Heparin level 0.3-0.7 units/ml ?Monitor platelets by anticoagulation protocol: Yes ?  ?Plan:  ?Continue heparin at 1800 units/hr. ?Check ~8 hr heparin level.  ?Daily CBC, heparin level. ?Monitor for signs/symptoms of bleeding. ? ? ?Vance Peper, PharmD ?PGY1 Pharmacy Resident ?Phone [phone] ?05/14/2021 2:55 PM  ? ?Please check AMION for all [place] phone numbers ?After 10:00 PM, call [place] [phone] ? ? ? ?

## 2021-05-14 NOTE — Progress Notes (Signed)
Briefly patient is a 81 year old man with nonischemic cardiomyopathy and chronic HFrEF, CAD, CKD 3B, HTN, severe MR and thrombocytopenia who was seen in heart failure clinic and was supposed to have right heart cath but had shortness of breath and was found to have PE.  Patient was admitted earlier today for anticoagulation. ? ?Acute PE ?Segmental and subsegmental PEs noted ?Continue IV heparin ? ?Biventricular heart failure with severe MR ?Over the course of the day patient's blood pressure has continued to fall modestly.  There is concern that given his known right heart failure at the small subsegmental and segmental PEs may be worsening right heart function.  Very much appreciate cardiology consultation and recommendations.  Patient was started on IV Lasix. ?

## 2021-05-14 NOTE — Consult Note (Addendum)
?Cardiology Consultation:  ? ?Patient ID: Matthew [name] ?MRN: [id number]; DOB: [birth year] ? ?Admit date: 05/13/2021 ?Date of Consult: 05/14/2021 ? ?PCP:  Pcp, No ?  ?[facility] Providers ?Cardiologist:  Larae Grooms, MD      ? ? ?Patient Profile:  ? ?Matthew [name] is a 81 y.o. male with a hx of NICM/chronic HFrEF, CAD with severe stenosis of the diagonal and lower limb of OM1 by cath in 2001 (treated medically), CKD 3b, HTN, HLD, severe mitral regurgitation, tricuspid regurgitation, ascending thoracic aortic aneurysm (4.5cm 05/2021), obesity, arthritis, recent appearing thrombocytopenia who is being seen 05/14/2021 for the evaluation of blood pressure management at the request of Dr. Jamse Arn.  ? ?The patient recently established care with Dr. Haroldine Laws and was pending cardiac cath, but admitted this admission with PE. ? ?History of Present Illness:  ? ?Matthew [name] was diagnosed with NICM many years ago and underwent cath in 2001 for EF of 25-30% showing borderline critical disease in the first diagonal and lower limb of the first obtuse marginal, otherwise mild nonobstructive disease as outlined below (report was scanned in in 2012). This was not felt to be the cause of his cardiomyopathy. His EF improved to 50-55% in 2014 but repeat echo 04/2020 showed decline in LVEF to 25-30% so he was followed for med titration. He was admitted 03/2021 with a/c CHF - had not been taking his CHF medications or diuretics several days prior to admission. BNP on admission was >4,500 and CXR showed cardiomegaly with mild pulmonary edema. 2D Echo showed severe dilated biventricular heart failure, LVEF 20 to 25%, RV severely enlarged and severely reduced, moderately elevated PASP, severe BAE, severe MR and severe TR. Cardiology consulted. Diuresed with lasix drip + metolazone.  Cardiology adjusted his HF meds and he was discharged on Lasix 20mg  BID, Coreg 25mg  BID, Entresto 24/26mg  BID, Spironolactone 25mg  daily, and Farxiga  10mg  daily. He was readmited 0000000 with metabolic encephalopathy as well as elevated creatinine in setting of recent UTI and spironolactone. Cr was 1.65, though recent Cr since 03/2021 has ranged 1.4-1.7. He was given IV fluids and HF meds restarted. He was seen in the Advanced HF clinic yesterday and felt to be severely volume overloaded with a plan to increase diuretics and pursue R/LHC, TEE, with discussions about conservative therapy versus pursuit of MV clip. He was seen with his son. They would like to pursue mTEER if he is a candidate. ? ?He presented to the hospital yesterday evening with onset of shortness of breath that started while watching TV associated with substernal/left sided chest pain that radiated to his left arm. The chest pain lasted "about a second" but the dyspnea persisted and he states "I felt like I was going to die." CXR showed a retrocardiac patchy airspace opacity. CTA demonstrated segmental and subsegmental pulmonary artery filling defects in the right lower lobe, distended RV, reflux of contrast into the inferior vena cava and hepatic veins which may be associated with right heart failure, small bilateral pleural effusions with atx, distended pulm trunk suggestive of underlying PAH, 4.5cm ascending TAA, and coronary calcifications. LE venoux duplex negative for DVT but waveforms suggestive of fluid overload. He has been started on IV heparin and got a dose of IV Lasix in the ED. Labs show Cr 1.42 (improved from prior), BNP >4500, troponin negative x2, Hgb 12.6 (microcytic), thrombocytopenia of 118 (decreased platelet count was new to 2023). Echo today shows EF 25%, global HK + severe LVEF, heavy trabeculations with no  LV thrombus, RV function normal (?), severely enlarged RV with normal RVSP, severe BAE, severe MR, severe TR, mild AI, moderate PR, mild dilation of the aortic root and moderate dilation of the ascending aorta. He denies any current complaints lying in bed. He has  a sloughed wound on his anterior right shin and wound care has been consulted. There is also an area of escar versus black pigmentation on the side of his right foot. HR 90s-100s, BP soft at 93/67 though this appears to be similar to recent values otherwise, does not historically have significant BP room. Remainder of home meds on hold at this time.  ? ? ?Past Medical History:  ?Diagnosis Date  ? CAD (coronary artery disease)   ? Chronic HFrEF (heart failure with reduced ejection fraction) ([place])   ? Chronic kidney disease, stage 3b ([place])   ? Encephalopathy 04/2021  ? admitted for metabolic encephalopathy  ? HTN (hypertension)   ? Hyperlipidemia   ? NICM (nonischemic cardiomyopathy) ([place])   ? Obesity   ? Osteoarthritis   ? Severe mitral regurgitation   ? Thoracic aortic aneurysm (TAA) ([place])   ? Thrombocytopenia ([place])   ? noted in labs in 2023  ? Tricuspid regurgitation   ? ? ?Past Surgical History:  ?Procedure Laterality Date  ? CHONDROPLASTY Left 01/11/2017  ? Procedure: CHONDROPLASTY of medial and patella compartment;  Surgeon: Dorna Leitz, MD;  Location: [facility];  Service: Orthopedics;  Laterality: Left;  ? KNEE ARTHROSCOPY Left 01/11/2017  ? Procedure: ARTHROSCOPIC IRRIGATION AND DEBRIDMENT KNEE;  Surgeon: Dorna Leitz, MD;  Location: [facility];  Service: Orthopedics;  Laterality: Left;  ? KNEE SURGERY    ? MENISECTOMY Left 01/11/2017  ? Procedure: Lateral MENISECTOMY;  Surgeon: Dorna Leitz, MD;  Location: [facility];  Service: Orthopedics;  Laterality: Left;  ? SYNOVECTOMY Left 01/11/2017  ? Procedure: Four compartment SYNOVECTOMY;  Surgeon: Dorna Leitz, MD;  Location: [facility];  Service: Orthopedics;  Laterality: Left;  ?  ? ?Home Medications:  ?Prior to Admission medications   ?Medication Sig Start Date End Date Taking? Authorizing Provider  ?acetaminophen (TYLENOL) 500 MG tablet Take 500 mg by mouth every 6 (six) hours as needed for moderate pain.   Yes [provider]  ?aspirin EC 81 MG tablet Take 1 tablet  (81 mg total) by mouth daily. 03/29/17  Yes Jettie Booze, MD  ?atorvastatin (LIPITOR) 10 MG tablet TAKE 1 TABLET(10 MG) BY MOUTH DAILY ?Patient taking differently: Take 10 mg by mouth daily. 07/19/20  Yes Jettie Booze, MD  ?dapagliflozin propanediol (FARXIGA) 10 MG TABS tablet Take 1 tablet (10 mg total) by mouth daily. 07/09/20  Yes Jettie Booze, MD  ?furosemide (LASIX) 20 MG tablet Take 2 tablets (40 mg total) by mouth every morning AND 1 tablet (20 mg total) every evening. 05/13/21  Yes Clegg, Amy D, NP  ?metoprolol succinate (TOPROL-XL) 25 MG 24 hr tablet Take 1.5 tablets (37.5 mg total) by mouth at bedtime. 05/10/21  Yes Jettie Booze, MD  ?Misc Natural Products ([place]) CAPS Take 1 capsule by mouth in the morning and at bedtime.   Yes [provider]  ?sacubitril-valsartan (ENTRESTO) 24-26 MG Take 1 tablet by mouth 2 (two) times daily. 05/04/21  Yes Jettie Booze, MD  ?spironolactone (ALDACTONE) 25 MG tablet Take 1 tablet (25 mg total) by mouth at bedtime. 04/09/21  Yes Eppie Gibson, MD  ?traMADol (ULTRAM) 50 MG tablet Take 50 mg by mouth every 8 (eight) hours  as needed for pain. 04/12/21  Yes [provider]  ? ? ?Inpatient Medications: ?Scheduled Meds: ? ?Continuous Infusions: ? heparin 1,800 Units/hr (05/14/21 0443)  ? ?PRN Meds: ?acetaminophen **OR** acetaminophen ? ?Allergies:    ?Allergies  ?Allergen Reactions  ? Lactose Intolerance (Gi) Other (See Comments)  ?  unknown  ? ? ?Social History:   ?Social History  ? ?Socioeconomic History  ? Marital status: Unknown  ?  Spouse name: Not on file  ? Number of children: Not on file  ? Years of education: Not on file  ? Highest education level: 11th grade  ?Occupational History  ? Occupation: retired  ?Tobacco Use  ? Smoking status: Never  ? Smokeless tobacco: Never  ?Vaping Use  ? Vaping Use: Never used  ?Substance and Sexual Activity  ? Alcohol use: No  ? Drug use: No  ? Sexual activity: Not on file   ?Other Topics Concern  ? Not on file  ?Social History Narrative  ? Not on file  ? ?Social Determinants of Health  ? ?Financial Resource Strain: Low Risk   ? Difficulty of Paying Living Expenses: Not very h

## 2021-05-14 NOTE — Assessment & Plan Note (Addendum)
Holding apixaban in preparation for valve intervention, currently on IV heparin for anticoagulation.   ?

## 2021-05-14 NOTE — Assessment & Plan Note (Addendum)
Patient was admitted to the cardiac ward and was placed IV furosemide for diuresis, negative fluid balance was achieved with significant improvement in his symptoms.  ? ?Echocardiogram with LV systolic function reduced to 25%, with global hypokinesis. RV systolic function is preserved. RV size with severe dilatation, RVSP 35,6 mmHg, severe dilatation of right and left atrium. Severe mitral regurgitation.  ? ?Patient with frequent PVC and NSVT, patient has been placed on amiodarone and mexiletin to suppress ectopy that possible are driving his cardiomyopathy.   ? ?Patient with signs of hypoperfusion.   ?Currently on milrinone infusion and continue with midodrine for blood pressure support.  ? ?Per EP recommendations continue with amiodarone and mexiletine. ? ?Plan for mitral valve edge to edge repair. ?Diuresis with torsemide.   ?

## 2021-05-14 NOTE — Progress Notes (Signed)
VASCULAR LAB ? ? ? ?Bilateral lower extremity venous duplex has been performed. ? ?See CV proc for preliminary results. ? ? ?Mackenzie Groom, RVT ?05/14/2021, 11:08 AM ? ?

## 2021-05-14 NOTE — ED Notes (Addendum)
Pt taken to CT scan.

## 2021-05-14 NOTE — Progress Notes (Signed)
?  Echocardiogram ?2D Echocardiogram with contrast has been performed. ? ?Matthew [name] ?05/14/2021, 11:56 AM ?

## 2021-05-14 NOTE — Progress Notes (Signed)
ANTICOAGULATION CONSULT NOTE - Initial Consult ? ?Pharmacy Consult for Heparin ?Indication: pulmonary embolus ? ?Allergies  ?Allergen Reactions  ? Lactose Intolerance (Gi) Other (See Comments)  ?  unknown  ? ? ?Patient Measurements: ?  ?Heparin Dosing Weight: 106 Kg ? ?Vital Signs: ?Temp: 97.7 ?F (36.5 ?C) (03/31 2126) ?Temp Source: Oral (03/31 2126) ?BP: 107/86 (04/01 0223) ?Pulse Rate: 93 (04/01 0224) ? ?Labs: ?Recent Labs  ?  05/13/21 ?1223 05/13/21 ?2142 05/14/21 ?0005  ?HGB 12.9* 12.6*  --   ?HCT 41.1 38.9*  --   ?PLT 104* 118*  --   ?CREATININE 1.42* 1.42*  --   ?TROPONINIHS  --  14 12  ? ? ?Estimated Creatinine Clearance: 55 mL/min (A) (by C-G formula based on SCr of 1.42 mg/dL (H)). ? ? ?Medical History: ?Past Medical History:  ?Diagnosis Date  ? CAD (coronary artery disease)   ? Cardiomyopathy (H[facility])   ? Heart failure (H[facility])   ? HTN (hypertension)   ? Obesity   ? Osteoarthritis   ? ? ?Assessment: 81 yo male with extensive PMH found to have PE. No anticoagulation prior to admit. Recent CBC stable. Pharmacy to dose heparin. ? ? ?Goal of Therapy:  ?Heparin level 0.3-0.7 units/ml ?Monitor platelets by anticoagulation protocol: Yes ?  ?Plan:  ?Give 6000 units bolus x 1 ?Start heparin infusion at 1800 units/hr ?Check anti-Xa level in 8 hours and daily while on heparin ?Continue to monitor H&H and platelets ? ?Ruben Im, PharmD ?Clinical Pharmacist ?05/14/2021 3:16 AM ?Please check AMION for all [facility] Pharmacy numbers ? ? ? ?

## 2021-05-14 NOTE — Progress Notes (Addendum)
Per patient/nurse request, reached out to sister Hassan Rowan to provide update of cardiology plan. ?Will also s/o to oncoming fellow to follow BMET/Mg/lactate results ordered now. ?Per Dr. Gardiner Rhyme, recommend to go ahead and give first dose of IV Lasix now. ?

## 2021-05-14 NOTE — Progress Notes (Signed)
?   05/14/21 1222  ?Assess: MEWS Score  ?Temp 98.5 ?F (36.9 ?C)  ?BP 100/83  ?Pulse Rate (!) 106  ?ECG Heart Rate (!) 106  ?Resp 17  ?Level of Consciousness Alert  ?SpO2 100 %  ?O2 Device Room Air  ?Assess: MEWS Score  ?MEWS Temp 0  ?MEWS Systolic 1  ?MEWS Pulse 1  ?MEWS RR 0  ?MEWS LOC 0  ?MEWS Score 2  ?MEWS Score Color Yellow  ?Assess: if the MEWS score is Yellow or Red  ?Were vital signs taken at a resting state? Yes  ?Focused Assessment No change from prior assessment  ?Early Detection of Sepsis Score *See Row Information* Low  ?MEWS guidelines implemented *See Row Information* Yes  ?Treat  ?MEWS Interventions Escalated (See documentation below)  ?Pain Scale 0-10  ?Pain Score 0  ?Take Vital Signs  ?Increase Vital Sign Frequency  Yellow: Q 2hr X 2 then Q 4hr X 2, if remains yellow, continue Q 4hrs  ?Escalate  ?MEWS: Escalate Yellow: discuss with charge nurse/RN and consider discussing with provider and RRT  ?Notify: Charge Nurse/RN  ?Name of Charge Nurse/RN Notified Eduardo Osier  ?Date Charge Nurse/RN Notified 05/14/21  ?Time Charge Nurse/RN Notified 1228  ?Document  ?Patient Outcome Other (Comment) ?(stable, remain in room)  ?Progress note created (see row info) Yes  ? ? ?

## 2021-05-14 NOTE — H&P (Signed)
?History and Physical  ? ? ?Matthew [name] [id number] DOB: 1941/01/06 DOA: 05/13/2021 ? ?PCP: Pcp, No ? ?Patient coming from: Home ? ?Chief Complaint: Chest pain, shortness of breath ? ?HPI: Matthew [name] is a 81 y.o. male with medical history significant of CAD, nonischemic cardiomyopathy/chronic biventricular heart failure, severe mitral regurgitation, hypertension, hyperlipidemia, BPH.  Recently admitted 3/25-3/28 for encephalopathy due to UTI and dehydration.  Seen by cardiology yesterday and given concern for worsening biventricular function/volume overload, planning on repeating RHC/LHC and TEE in the next few weeks.  Dose of Lasix was increased to 40 mg in the morning and 20 mg in the evening.   ? ?Patient presents to the ED complaining of chest pain and shortness of breath.  Not hypoxic.  Labs showing no leukocytosis.  Hemoglobin 12.6, stable.  Platelet count 118k, stable.  Bicarb 20.  Creatinine 1.4, stable.  Troponin negative x2.  BNP >4500 and consistent with values seen during labs done over the past month.  COVID and flu negative. CTA chest showing right lower lobe segmental and subsegmental PE.  Right ventricle is distended on CT which may be associated with underlying right heart strain.  There is also reflux of contrast into the inferior vena cava and hepatic veins which may be associated with right heart failure.  Small bilateral pleural effusions with atelectasis at the lung bases.  Distended pulmonary trunk suggesting underlying pulmonary artery hypertension.  Aneurysmal dilation of ascending aorta measuring 4.5 cm. Patient was given IV Lasix 40 mg and started on IV heparin for anticoagulation. ? ?Patient states yesterday he was watching TV when all of a sudden he experienced substernal/left-sided chest pain which radiated to his left arm and was associated with shortness of breath.  No chest pain or shortness of breath at this time.  Denies recent travel.  Reports bilateral lower extremity  edema.  He is not sure about the dose of his home Lasix.  No additional history could be obtained from him. ? ?Review of Systems:  ?Review of Systems  ?All other systems reviewed and are negative. ? ?Past Medical History:  ?Diagnosis Date  ? CAD (coronary artery disease)   ? Cardiomyopathy ([place])   ? Heart failure ([place])   ? HTN (hypertension)   ? Obesity   ? Osteoarthritis   ? ? ?Past Surgical History:  ?Procedure Laterality Date  ? CHONDROPLASTY Left 01/11/2017  ? Procedure: CHONDROPLASTY of medial and patella compartment;  Surgeon: Dorna Leitz, MD;  Location: [place];  Service: Orthopedics;  Laterality: Left;  ? KNEE ARTHROSCOPY Left 01/11/2017  ? Procedure: ARTHROSCOPIC IRRIGATION AND DEBRIDMENT KNEE;  Surgeon: Dorna Leitz, MD;  Location: [place];  Service: Orthopedics;  Laterality: Left;  ? KNEE SURGERY    ? MENISECTOMY Left 01/11/2017  ? Procedure: Lateral MENISECTOMY;  Surgeon: Dorna Leitz, MD;  Location: [place];  Service: Orthopedics;  Laterality: Left;  ? SYNOVECTOMY Left 01/11/2017  ? Procedure: Four compartment SYNOVECTOMY;  Surgeon: Dorna Leitz, MD;  Location: [place];  Service: Orthopedics;  Laterality: Left;  ? ? ? reports that he has never smoked. He has never used smokeless tobacco. He reports that he does not drink alcohol and does not use drugs. ? ?Allergies  ?Allergen Reactions  ? Lactose Intolerance (Gi) Other (See Comments)  ?  unknown  ? ? ?Family History  ?Problem Relation Age of Onset  ? Hypertension Father   ? Diabetes Brother   ? Heart attack Neg Hx   ? Stroke Neg Hx   ? ? ?  Prior to Admission medications   ?Medication Sig Start Date End Date Taking? Authorizing Provider  ?acetaminophen (TYLENOL) 500 MG tablet Take 500 mg by mouth every 6 (six) hours as needed for moderate pain.    [provider]  ?aspirin EC 81 MG tablet Take 1 tablet (81 mg total) by mouth daily. 03/29/17   Jettie Booze, MD  ?atorvastatin (LIPITOR) 10 MG tablet TAKE 1 TABLET(10 MG) BY MOUTH DAILY ?Patient  taking differently: Take 10 mg by mouth daily. 07/19/20   Jettie Booze, MD  ?dapagliflozin propanediol (FARXIGA) 10 MG TABS tablet Take 1 tablet (10 mg total) by mouth daily. 07/09/20   Jettie Booze, MD  ?furosemide (LASIX) 20 MG tablet Take 2 tablets (40 mg total) by mouth every morning AND 1 tablet (20 mg total) every evening. 05/13/21   Clegg, Amy D, NP  ?metoprolol succinate (TOPROL-XL) 25 MG 24 hr tablet Take 1.5 tablets (37.5 mg total) by mouth at bedtime. 05/10/21   Jettie Booze, MD  ?Misc Natural Products [facility]) CAPS Take 1 capsule by mouth in the morning and at bedtime.    [provider]  ?sacubitril-valsartan (ENTRESTO) 24-26 MG Take 1 tablet by mouth 2 (two) times daily. 05/04/21   Jettie Booze, MD  ?spironolactone (ALDACTONE) 25 MG tablet Take 1 tablet (25 mg total) by mouth at bedtime. 04/09/21   Eppie Gibson, MD  ?traMADol (ULTRAM) 50 MG tablet Take 50 mg by mouth every 8 (eight) hours as needed for pain. 04/12/21   [provider]  ? ? ?Physical Exam: ?Vitals:  ? 05/14/21 0500 05/14/21 0515 05/14/21 [id number] 05/14/21 [id number]  ?BP: (!) 106/95 114/87 111/86 119/87  ?Pulse: (!) 107 90 (!) 101 99  ?Resp: (!) 22 19 20 18   ?Temp:   (!) 97.3 ?F (36.3 ?C) (!) 97.4 ?F (36.3 ?C)  ?TempSrc:   Axillary Oral  ?SpO2: 100% 100% 97% 100%  ?Weight:   99.9 kg   ?Height:   6\' 7"  (2.007 m)   ? ? ?Physical Exam ?Constitutional:   ?   General: He is not in acute distress. ?HENT:  ?   Head: Normocephalic and atraumatic.  ?Eyes:  ?   Extraocular Movements: Extraocular movements intact.  ?   Conjunctiva/sclera: Conjunctivae normal.  ?Neck:  ?   Comments: +JVD ?Cardiovascular:  ?   Rate and Rhythm: Regular rhythm. Tachycardia present.  ?   Pulses: Normal pulses.  ?Pulmonary:  ?   Effort: Pulmonary effort is normal. No respiratory distress.  ?   Breath sounds: No wheezing or rales.  ?Abdominal:  ?   General: Bowel sounds are normal. There is no distension.  ?   Palpations:  Abdomen is soft.  ?   Tenderness: There is no abdominal tenderness.  ?Musculoskeletal:     ?   General: No swelling or tenderness.  ?   Cervical back: Normal range of motion and neck supple.  ?Skin: ?   General: Skin is warm and dry.  ?Neurological:  ?   General: No focal deficit present.  ?   Mental Status: He is alert and oriented to person, place, and time.  ?  ? ?Labs on Admission: I have personally reviewed following labs and imaging studies ? ?CBC: ?Recent Labs  ?Lab 05/07/21 ?2130 05/09/21 ?[id number] 05/10/21 ?0205 05/13/21 ?1223 05/13/21 ?2142  ?WBC 4.3 4.9 4.5 4.3 4.6  ?NEUTROABS 2.7 3.2 2.9  --   --   ?HGB 13.5 12.0* 12.0* 12.9* 12.6*  ?  HCT 42.9 36.5* 37.1* 41.1 38.9*  ?MCV 80.8 78.3* 78.8* 80.3 79.6*  ?PLT 123* 112* 104* 104* 118*  ? ?Basic Metabolic Panel: ?Recent Labs  ?Lab 05/08/21 ?1031 05/09/21 ?[id number] 05/10/21 ?0205 05/13/21 ?1223 05/13/21 ?2142  ?NA 136 137 136 136 135  ?K 4.5 3.7 4.3 5.0 4.9  ?CL 108 107 106 102 105  ?CO2 17* 22 22 25  20*  ?GLUCOSE 117* 95 112* 91 109*  ?BUN 19 16 13 18 19   ?CREATININE 1.53* 1.34* 1.33* 1.42* 1.42*  ?CALCIUM 8.5* 8.6* 8.7* 9.3 9.0  ?MG 1.7 1.9 2.1  --   --   ? ?GFR: ?Estimated Creatinine Clearance: 55 mL/min (A) (by C-G formula based on SCr of 1.42 mg/dL (H)). ?Liver Function Tests: ?Recent Labs  ?Lab 05/07/21 ?2130 05/09/21 ?[id number] 05/10/21 ?0205  ?AST 19 13* 14*  ?ALT 16 11 11   ?ALKPHOS 111 98 102  ?BILITOT 1.6* 1.1 1.4*  ?PROT 7.5 6.4* 6.7  ?ALBUMIN 3.3* 2.8* 2.8*  ? ?No results for input(s): LIPASE, AMYLASE in the last 168 hours. ?Recent Labs  ?Lab 05/07/21 ?2130  ?AMMONIA 34  ? ?Coagulation Profile: ?No results for input(s): INR, PROTIME in the last 168 hours. ?Cardiac Enzymes: ?No results for input(s): CKTOTAL, CKMB, CKMBINDEX, TROPONINI in the last 168 hours. ?BNP (last 3 results) ?No results for input(s): PROBNP in the last 8760 hours. ?HbA1C: ?No results for input(s): HGBA1C in the last 72 hours. ?CBG: ?Recent Labs  ?Lab 05/07/21 ?2231  ?GLUCAP 83  ? ?Lipid  Profile: ?No results for input(s): CHOL, HDL, LDLCALC, TRIG, CHOLHDL, LDLDIRECT in the last 72 hours. ?Thyroid Function Tests: ?No results for input(s): TSH, T4TOTAL, FREET4, T3FREE, THYROIDAB in the last 72 hours.

## 2021-05-15 ENCOUNTER — Inpatient Hospital Stay (HOSPITAL_COMMUNITY): Payer: Medicare Other

## 2021-05-15 DIAGNOSIS — I2699 Other pulmonary embolism without acute cor pulmonale: Secondary | ICD-10-CM | POA: Diagnosis not present

## 2021-05-15 DIAGNOSIS — L0390 Cellulitis, unspecified: Secondary | ICD-10-CM

## 2021-05-15 DIAGNOSIS — I493 Ventricular premature depolarization: Secondary | ICD-10-CM | POA: Diagnosis present

## 2021-05-15 DIAGNOSIS — I5043 Acute on chronic combined systolic (congestive) and diastolic (congestive) heart failure: Secondary | ICD-10-CM | POA: Diagnosis not present

## 2021-05-15 LAB — CBC
HCT: 36.8 % — ABNORMAL LOW (ref 39.0–52.0)
Hemoglobin: 11.8 g/dL — ABNORMAL LOW (ref 13.0–17.0)
MCH: 25.4 pg — ABNORMAL LOW (ref 26.0–34.0)
MCHC: 32.1 g/dL (ref 30.0–36.0)
MCV: 79.3 fL — ABNORMAL LOW (ref 80.0–100.0)
Platelets: 110 10*3/uL — ABNORMAL LOW (ref 150–400)
RBC: 4.64 MIL/uL (ref 4.22–5.81)
RDW: 21.4 % — ABNORMAL HIGH (ref 11.5–15.5)
WBC: 4.8 10*3/uL (ref 4.0–10.5)
nRBC: 0 % (ref 0.0–0.2)

## 2021-05-15 LAB — BASIC METABOLIC PANEL
Anion gap: 9 (ref 5–15)
Anion gap: 7 (ref 5–15)
BUN: 18 mg/dL (ref 8–23)
BUN: 19 mg/dL (ref 8–23)
CO2: 26 mmol/L (ref 22–32)
CO2: 27 mmol/L (ref 22–32)
Calcium: 8.7 mg/dL — ABNORMAL LOW (ref 8.9–10.3)
Calcium: 8.8 mg/dL — ABNORMAL LOW (ref 8.9–10.3)
Chloride: 101 mmol/L (ref 98–111)
Chloride: 102 mmol/L (ref 98–111)
Creatinine, Ser: 1.39 mg/dL — ABNORMAL HIGH (ref 0.61–1.24)
Creatinine, Ser: 1.49 mg/dL — ABNORMAL HIGH (ref 0.61–1.24)
GFR, Estimated: 51 mL/min — ABNORMAL LOW (ref >60)
GFR, Estimated: 47 mL/min — ABNORMAL LOW (ref >60)
Glucose, Bld: 93 mg/dL (ref 70–99)
Glucose, Bld: 106 mg/dL — ABNORMAL HIGH (ref 70–99)
Potassium: 4.4 mmol/L (ref 3.5–5.1)
Potassium: 4.5 mmol/L (ref 3.5–5.1)
Sodium: 136 mmol/L (ref 135–145)
Sodium: 136 mmol/L (ref 135–145)

## 2021-05-15 LAB — HEPARIN LEVEL (UNFRACTIONATED): Heparin Unfractionated: 0.54 IU/mL (ref 0.30–0.70)

## 2021-05-15 LAB — MAGNESIUM: Magnesium: 1.8 mg/dL (ref 1.7–2.4)

## 2021-05-15 MED ORDER — APIXABAN 5 MG PO TABS: 5.0000 mg | ORAL_TABLET | Freq: Two times a day (BID) | ORAL | Status: DC

## 2021-05-15 MED ORDER — MAGNESIUM SULFATE IN D5W 1-5 GM/100ML-% IV SOLN
1.0000 g | Freq: Once | INTRAVENOUS | Status: AC
  Administered 2021-05-15: 1 g via INTRAVENOUS
  Filled 2021-05-15: qty 100

## 2021-05-15 MED ORDER — APIXABAN 5 MG PO TABS
10.0000 mg | ORAL_TABLET | Freq: Two times a day (BID) | ORAL | Status: DC
Start: 2021-05-15 — End: 2021-05-17
  Administered 2021-05-15 – 2021-05-17 (×5): 10 mg via ORAL
  Filled 2021-05-15 (×5): qty 2

## 2021-05-15 NOTE — Progress Notes (Signed)
ANTICOAGULATION CONSULT NOTE - Follow Up Consult ? ?Pharmacy Consult for Heparin ?Indication: pulmonary embolus ? ?Allergies  ?Allergen Reactions  ? Lactose Intolerance (Gi) Other (See Comments)  ?  unknown  ? ? ?Patient Measurements: ?Height: 6\' 7"  (200.7 cm) ?Weight: 99.9 kg (220 lb 3.8 oz) ?IBW/kg (Calculated) : 93.7 ?Heparin Dosing Weight: 99.9 kg ? ?Vital Signs: ?Temp: 98.1 ?F (36.7 ?C) (04/02 0429) ?Temp Source: Oral (04/02 0429) ?BP: 103/79 (04/02 0429) ?Pulse Rate: (P) 100 (04/02 [birth year]-29-1982) ? ?Labs: ?Recent Labs  ?  05/13/21 ?1223 05/13/21 ?2142 05/14/21 ?0005 05/14/21 ?1418 05/14/21 ?1626 05/14/21 ?2141 05/15/21 ?0326  ?HGB 12.9* 12.6*  --   --   --   --  11.8*  ?HCT 41.1 38.9*  --   --   --   --  36.8*  ?PLT 104* 118*  --   --   --   --  110*  ?HEPARINUNFRC  --   --   --  0.51  --  0.54 0.54  ?CREATININE 1.42* 1.42*  --   --  1.47*  --  1.39*  ?TROPONINIHS  --  14 12  --   --   --   --   ? ? ?Estimated Creatinine Clearance: 56.2 mL/min (A) (by C-G formula based on SCr of 1.39 mg/dL (H)). ? ? ?Medications:  ?Scheduled:  ? furosemide  40 mg Intravenous BID  ? magnesium oxide  400 mg Oral Daily  ? ?Infusions:  ? heparin 1,800 Units/hr (05/15/21 0519)  ? ? ?Assessment: ?81 yo M with extensive PMH found to have PE. No anticoagulation PTA. Pharmacy consulted for heparin dosing.  ? ?Heparin level today is therapeutic at 0.54, on 1800 units/hr. Hgb 11.8, plt 110. No line issues or signs/symptoms of bleeding noted per RN. ? ?Goal of Therapy:  ?Heparin level 0.3-0.7 units/ml ?Monitor platelets by anticoagulation protocol: Yes ?  ?Plan:  ?Continue heparin at 1800 units/hr. ?Daily CBC, heparin level. ?Monitor for signs/symptoms of bleeding. ? ? ?96, PharmD ?PGY1 Pharmacy Resident ?Phone [phone] ?05/15/2021 8:22 AM  ? ?Please check AMION for all [facility] Pharmacy phone numbers ?After 10:00 PM, call Main Pharmacy [phone] ? ? ? ?

## 2021-05-15 NOTE — Progress Notes (Signed)
ANTICOAGULATION CONSULT NOTE - Follow Up Consult ? ?Pharmacy Consult for Heparin ?Indication: pulmonary embolus ? ?Allergies  ?Allergen Reactions  ? Lactose Intolerance (Gi) Other (See Comments)  ?  unknown  ? ? ?Patient Measurements: ?Height: 6\' 7"  (200.7 cm) ?Weight: 99.9 kg (220 lb 3.8 oz) ?IBW/kg (Calculated) : 93.7 ?Heparin Dosing Weight: 99.9 kg ? ?Vital Signs: ?Temp: 97.8 ?F (36.6 ?C) (04/01 2007) ?Temp Source: Oral (04/01 2007) ?BP: 99/69 (04/01 2007) ?Pulse Rate: 98 (04/01 2007) ? ?Labs: ?Recent Labs  ?  05/13/21 ?1223 05/13/21 ?2142 05/14/21 ?0005 05/14/21 ?1418 05/14/21 ?1626 05/14/21 ?2141  ?HGB 12.9* 12.6*  --   --   --   --   ?HCT 41.1 38.9*  --   --   --   --   ?PLT 104* 118*  --   --   --   --   ?HEPARINUNFRC  --   --   --  0.51  --  0.54  ?CREATININE 1.42* 1.42*  --   --  1.47*  --   ?TROPONINIHS  --  14 12  --   --   --   ? ? ? ?Estimated Creatinine Clearance: 53.1 mL/min (A) (by C-G formula based on SCr of 1.47 mg/dL (H)). ? ? ?Medications:  ?Scheduled:  ? furosemide  40 mg Intravenous BID  ? magnesium oxide  400 mg Oral Daily  ? ?Infusions:  ? heparin 1,800 Units/hr (05/14/21 1522)  ? ? ?Assessment: ?81 yo M with extensive PMH found to have PE. No anticoagulation PTA. Pharmacy consulted for heparin dosing.  ? ?Heparin level therapeutic: 0.54 No issues or signs/symptoms of bleeding noted. ? ?Goal of Therapy:  ?Heparin level 0.3-0.7 units/ml ?Monitor platelets by anticoagulation protocol: Yes ?  ?Plan:  ?Continue heparin at 1800 units/hr. ?Check ~8 hr heparin level.  ?Daily CBC, heparin level. ?Monitor for signs/symptoms of bleeding. ? ? ?Georga Bora, PharmD ?Clinical Pharmacist ?05/15/2021 12:07 AM ?Please check AMION for all [facility] numbers ? ? ? ? ?

## 2021-05-15 NOTE — Progress Notes (Signed)
Patient had 21 beats of Vtach, asymptomatic, Dayna Dunn made aware and orders received for stat labs. Will closely monitor ? ? ?

## 2021-05-15 NOTE — Discharge Instructions (Addendum)
Information on my medicine - ELIQUIS? (apixaban) ? ?This medication education was reviewed with me or my healthcare representative as part of my discharge preparation.   ? ?Why was Eliquis? prescribed for you? ?Eliquis? was prescribed to treat blood clots that may have been found in the veins of your legs (deep vein thrombosis) or in your lungs (pulmonary embolism) and to reduce the risk of them occurring again. ? ?What do You need to know about Eliquis? ? ?The starting dose is 10 mg (two 5 mg tablets) taken TWICE daily for the FIRST SEVEN (7) DAYS, then on (enter date)  05/22/2021  the dose is reduced to ONE 5 mg tablet taken TWICE daily.  Eliquis? may be taken with or without food.  ? ?Try to take the dose about the same time in the morning and in the evening. If you have difficulty swallowing the tablet whole please discuss with your pharmacist how to take the medication safely. ? ?Take Eliquis? exactly as prescribed and DO NOT stop taking Eliquis? without talking to the doctor who prescribed the medication.  Stopping may increase your risk of developing a new blood clot.  Refill your prescription before you run out. ? ?After discharge, you should have regular check-up appointments with your healthcare provider that is prescribing your Eliquis?. ?   ?What do you do if you miss a dose? ?If a dose of ELIQUIS? is not taken at the scheduled time, take it as soon as possible on the same day and twice-daily administration should be resumed. The dose should not be doubled to make up for a missed dose. ? ?Important Safety Information ?A possible side effect of Eliquis? is bleeding. You should call your healthcare provider right away if you experience any of the following: ?Bleeding from an injury or your nose that does not stop. ?Unusual colored urine (red or dark brown) or unusual colored stools (red or black). ?Unusual bruising for unknown reasons. ?A serious fall or if you hit your head (even if there is no  bleeding). ? ?Some medicines may interact with Eliquis? and might increase your risk of bleeding or clotting while on Eliquis?Marland Kitchen To help avoid this, consult your healthcare provider or pharmacist prior to using any new prescription or non-prescription medications, including herbals, vitamins, non-steroidal anti-inflammatory drugs (NSAIDs) and supplements. ? ?This website has more information on Eliquis? (apixaban): http://www.eliquis.com/eliquis/home  ? ?========================================== ? ?Pulmonary Embolism ? ?  ?A pulmonary embolism (PE) is a sudden blockage or decrease of blood flow in one or both lungs. Most blockages come from a blood clot that forms in the vein of a lower leg, thigh, or arm (deep vein thrombosis, DVT) and travels to the lungs. A clot is blood that has thickened into a gel or solid. PE is a dangerous and life-threatening condition that needs to be treated right away. ? ?What are the causes? ?This condition is usually caused by a blood clot that forms in a vein and moves to the lungs. In rare cases, it may be caused by air, fat, part of a tumor, or other tissue that moves through the veins and into the lungs. ? ?What increases the risk? ?The following factors may make you more likely to develop this condition: ?Experiencing a traumatic injury, such as breaking a hip or leg. ?Having: ?A spinal cord injury. ?Orthopedic surgery, especially hip or knee replacement. ?Any major surgery. ?A stroke. ?DVT. ?Blood clots or blood clotting disease. ?Long-term (chronic) lung or heart disease. ?Cancer treated with chemotherapy. ?A  central venous catheter. ?Taking medicines that contain estrogen. These include birth control pills and hormone replacement therapy. ?Being: ?Pregnant. ?In the period of time after your baby is delivered (postpartum). ?Older than age 75. ?Overweight. ?A smoker, especially if you have other risks. ? ?What are the signs or symptoms? ?Symptoms of this condition usually start  suddenly and include: ?Shortness of breath during activity or at rest. ?Coughing, coughing up blood, or coughing up blood-tinged mucus. ?Chest pain that is often worse with deep breaths. ?Rapid or irregular heartbeat. ?Feeling light-headed or dizzy. ?Fainting. ?Feeling anxious. ?Fever. ?Sweating. ?Pain and swelling in a leg. This is a symptom of DVT, which can lead to PE. ?How is this diagnosed? ?This condition may be diagnosed based on: ?Your medical history. ?A physical exam. ?Blood tests. ?CT pulmonary angiogram. This test checks blood flow in and around your lungs. ?Ventilation-perfusion scan, also called a lung VQ scan. This test measures air flow and blood flow to the lungs. ?An ultrasound of the legs. ? ?How is this treated? ?Treatment for this condition depends on many factors, such as the cause of your PE, your risk for bleeding or developing more clots, and other medical conditions you have. Treatment aims to remove, dissolve, or stop blood clots from forming or growing larger. Treatment may include: ?Medicines, such as: ?Blood thinning medicines (anticoagulants) to stop clots from forming. ?Medicines that dissolve clots (thrombolytics). ?Procedures, such as: ?Using a flexible tube to remove a blood clot (embolectomy) or to deliver medicine to destroy it (catheter-directed thrombolysis). ?Inserting a filter into a large vein that carries blood to the heart (inferior vena cava). This filter (vena cava filter) catches blood clots before they reach the lungs. ?Surgery to remove the clot (surgical embolectomy). This is rare. ?You may need a combination of immediate, long-term (up to 3 months after diagnosis), and extended (more than 3 months after diagnosis) treatments. Your treatment may continue for several months (maintenance therapy). You and your health care provider will work together to choose the treatment program that is best for you. ? ?Follow these instructions at home: ?Medicines ?Take  over-the-counter and prescription medicines only as told by your health care provider. ?If you are taking an anticoagulant medicine: ?Take the medicine every day at the same time each day. ?Understand what foods and drugs interact with your medicine. ?Understand the side effects of this medicine, including excessive bruising or bleeding. Ask your health care provider or pharmacist about other side effects. ? ?General instructions ?Wear a medical alert bracelet or carry a medical alert card that says you have had a PE and lists what medicines you take. ?Ask your health care provider when you may return to your normal activities. Avoid sitting or lying for a long time without moving. ?Maintain a healthy weight. Ask your health care provider what weight is healthy for you. ?Do not use any products that contain nicotine or tobacco, such as cigarettes, e-cigarettes, and chewing tobacco. If you need help quitting, ask your health care provider. ?Talk with your health care provider about any travel plans. It is important to make sure that you are still able to take your medicine while on trips. ?Keep all follow-up visits as told by your health care provider. This is important. ? ?Contact a health care provider if: ?You missed a dose of your blood thinner medicine. ? ?Get help right away if: ?You have: ?New or increased pain, swelling, warmth, or redness in an arm or leg. ?Numbness or tingling in an arm  or leg. ?Shortness of breath during activity or at rest. ?A fever. ?Chest pain. ?A rapid or irregular heartbeat. ?A severe headache. ?Vision changes. ?A serious fall or accident, or you hit your head. ?Stomach (abdominal) pain. ?Blood in your vomit, stool, or urine. ?A cut that will not stop bleeding. ?You cough up blood. ?You feel light-headed or dizzy. ?You cannot move your arms or legs. ?You are confused or have memory loss. ? ?These symptoms may represent a serious problem that is an emergency. Do not wait to see if the  symptoms will go away. Get medical help right away. Call your local emergency services (911 in the [place] Do not drive yourself to the hospital. ?Summary ?A pulmonary embolism (PE) is a sudden blockage or decrease of blood fl

## 2021-05-15 NOTE — Progress Notes (Signed)
? ?Progress Note ? ?Patient Name: Matthew [name] ?Date of Encounter: 05/15/2021 ? ?[facility] Cardiologist: Larae Grooms, MD  ? ?Subjective  ? ?Net -2.7 L yesterday.  Creatinine mildly improved (1.47 > 1.39).  He reports dyspnea improved ? ?Inpatient Medications  ?  ?Scheduled Meds: ? furosemide  40 mg Intravenous BID  ? magnesium oxide  400 mg Oral Daily  ? ?Continuous Infusions: ? heparin 1,800 Units/hr (05/15/21 0519)  ? ?PRN Meds: ?acetaminophen **OR** acetaminophen  ? ?Vital Signs  ?  ?Vitals:  ? 05/15/21 0029 05/15/21 0429 05/15/21 0822 05/15/21 0900  ?BP: 107/70 103/79 109/63 104/72  ?Pulse: (!) 106 98 100 97  ?Resp: 20 17 19  (!) 23  ?Temp: 98.5 ?F (36.9 ?C) 98.1 ?F (36.7 ?C)    ?TempSrc: Oral Oral    ?SpO2: 97% 97% 99% 98%  ?Weight:      ?Height:      ? ? ?Intake/Output Summary (Last 24 hours) at 05/15/2021 1142 ?Last data filed at 05/15/2021 1057 ?Gross per 24 hour  ?Intake 392.88 ml  ?Output 4050 ml  ?Net -3657.12 ml  ? ? ?  05/14/2021  ?  6:05 AM 05/13/2021  ? 11:11 AM 05/08/2021  ?  6:30 AM  ?Last 3 Weights  ?Weight (lbs) 220 lb 3.8 oz 234 lb 3.2 oz 221 lb 1.9 oz  ?Weight (kg) 99.9 kg 106.232 kg 100.3 kg  ?   ? ?Telemetry  ?  ?Sinus rhythm 90s to 110s, frequent PVCs, brief NSVT- Personally Reviewed ? ?ECG  ?  ?No new ECG- Personally Reviewed ? ?Physical Exam  ? ?GEN: No acute distress.   ?Neck: + JVD ?Cardiac: Tachycardic, regular, no murmurs, rubs, or gallops.  ?Respiratory: Clear to auscultation bilaterally. ?GI: Soft, nontender ?MS: Trace edema ?Neuro:  Nonfocal  ?Psych: Normal affect  ? ?Labs  ?  ?High Sensitivity Troponin:   ?Recent Labs  ?Lab 05/13/21 ?2142 05/14/21 ?0005  ?TROPONINIHS 14 12  ?   ?Chemistry ?Recent Labs  ?Lab 05/09/21 ?[id number] 05/10/21 ?0205 05/13/21 ?1223 05/13/21 ?2142 05/14/21 ?1626 05/15/21 ?0326  ?NA 137 136   < > 135 134* 136  ?K 3.7 4.3   < > 4.9 4.5 4.4  ?CL 107 106   < > 105 103 101  ?CO2 22 22   < > 20* 22 26  ?GLUCOSE 95 112*   < > 109* 111* 93  ?BUN 16 13   < > 19 21 18    ?CREATININE 1.34* 1.33*   < > 1.42* 1.47* 1.39*  ?CALCIUM 8.6* 8.7*   < > 9.0 8.7* 8.7*  ?MG 1.9 2.1  --   --  1.8  --   ?PROT 6.4* 6.7  --   --   --   --   ?ALBUMIN 2.8* 2.8*  --   --   --   --   ?AST 13* 14*  --   --   --   --   ?ALT 11 11  --   --   --   --   ?ALKPHOS 98 102  --   --   --   --   ?BILITOT 1.1 1.4*  --   --   --   --   ?GFRNONAA 54* 54*   < > 50* 48* 51*  ?ANIONGAP 8 8   < > 10 9 9   ? < > = values in this interval not displayed.  ?  ?Lipids No results for input(s): CHOL, TRIG, HDL, LABVLDL, LDLCALC, CHOLHDL  in the last 168 hours.  ?Hematology ?Recent Labs  ?Lab 05/13/21 ?1223 05/13/21 ?2142 05/15/21 ?0326  ?WBC 4.3 4.6 4.8  ?RBC 5.12 4.89 4.64  ?HGB 12.9* 12.6* 11.8*  ?HCT 41.1 38.9* 36.8*  ?MCV 80.3 79.6* 79.3*  ?MCH 25.2* 25.8* 25.4*  ?MCHC 31.4 32.4 32.1  ?RDW 21.7* 21.5* 21.4*  ?PLT 104* 118* 110*  ? ?Thyroid No results for input(s): TSH, FREET4 in the last 168 hours.  ?BNP ?Recent Labs  ?Lab 05/09/21 ?[id number] 05/10/21 ?0205 05/13/21 ?2142  ?BNP >4,500.0* >4,500.0* >4,500.0*  ?  ?DDimer No results for input(s): DDIMER in the last 168 hours.  ? ?Radiology  ?  ?DG Chest 2 View ? ?Result Date: 05/13/2021 ?CLINICAL DATA:  cp EXAM: CHEST - 2 VIEW COMPARISON:  Chest x-ray 05/07/2021 FINDINGS: Similar-appearing cardiomegaly. The heart and mediastinal contours are unchanged. Aortic calcification. Retrocardiac patchy airspace opacity. No pulmonary edema. Trace left pleural effusion. No pneumothorax. No acute osseous abnormality. IMPRESSION: 1. Retrocardiac patchy airspace opacity. Followup PA and lateral chest X-ray is recommended in 3-4 weeks following trial of antibiotic therapy to ensure resolution and exclude underlying malignancy. 2. Trace left pleural effusion. 3.  Aortic Atherosclerosis (ICD10-I70.0). Electronically Signed   By: Iven Finn M.D.   On: 05/13/2021 23:17  ? ?CT Angio Chest PE W and/or Wo Contrast ? ?Addendum Date: 05/14/2021   ?ADDENDUM REPORT: 05/14/2021 02:57 ADDENDUM: Critical  findings were reported to PA Rayna Sexton at 2:55 a.m. Electronically Signed   By: Brett Fairy M.D.   On: 05/14/2021 02:57  ? ?Result Date: 05/14/2021 ?CLINICAL DATA:  Chest pain and shortness of breath. EXAM: CT ANGIOGRAPHY CHEST WITH CONTRAST TECHNIQUE: Multidetector CT imaging of the chest was performed using the standard protocol during bolus administration of intravenous contrast. Multiplanar CT image reconstructions and MIPs were obtained to evaluate the vascular anatomy. RADIATION DOSE REDUCTION: This exam was performed according to the departmental dose-optimization program which includes automated exposure control, adjustment of the mA and/or kV according to patient size and/or use of iterative reconstruction technique. CONTRAST:  124mL OMNIPAQUE IOHEXOL 350 MG/ML SOLN COMPARISON:  None. FINDINGS: Cardiovascular: The heart is enlarged and multi-vessel coronary artery calcifications are noted. There is atherosclerotic calcification of the aorta with aneurysmal dilatation of the ascending aorta measuring 4.5 cm. The pulmonary trunk is distended which may be associated with underlying pulmonary artery hypertension. Pulmonary artery filling defects are present in the segmental and subsegmental arteries in the right lower lobe. Evaluation of the subsegmental arteries at the left lung base is limited due to respiratory motion artifact. The right ventricle is distended suggesting underlying right heart strain. Mediastinum/Nodes: Calcified lymph nodes are present in the mediastinum and hilar regions bilaterally. No axillary lymphadenopathy. The thyroid gland is heterogeneous with a few subcentimeter hypodensities in the left lobe. The trachea and esophagus are within normal limits. Lungs/Pleura: There are small bilateral pleural effusions, greater on the right than on the left. There is mild atelectasis at the lung bases. Mild fibrotic changes are noted in the lungs bilaterally. No pneumothorax. Calcified  granuloma are noted bilaterally. Upper Abdomen: There is reflux of contrast into the inferior vena cava and hepatic veins suggesting right heart failure. Musculoskeletal: Gynecomastia and mild anasarca is noted. Degenerative changes in the thoracic spine. No acute osseous abnormality. Review of the MIP images confirms the above findings. IMPRESSION: 1. Segmental and subsegmental pulmonary artery filling defects in the right lower lobe. The right ventricle is distended which may be associated with underlying right heart strain.  There is also reflux of contrast into the inferior vena cava and hepatic veins which may be associated with right heart failure. 2. Small bilateral pleural effusions with atelectasis at the lung bases 3. Distended pulmonary trunk suggesting underlying pulmonary artery hypertension. 4. Aortic atherosclerosis with aneurysmal dilatation of the ascending aorta measuring 4.5 cm. Ascending thoracic aortic aneurysm. Recommend semi-annual imaging followup by CTA or MRA and referral to cardiothoracic surgery if not already obtained. This recommendation follows 2010 ACCF/AHA/AATS/ACR/ASA/SCA/SCAI/SIR/STS/SVM Guidelines for the Diagnosis and Management of Patients With Thoracic Aortic Disease. Circulation. 2010; [id number]. Aortic aneurysm NOS (ICD10-I71.9) 5. Cardiomegaly with coronary artery calcifications. Electronically Signed: By: Brett Fairy M.D. On: 05/14/2021 02:48  ? ?VAS [place] ABI WITH/WO TBI ? ?Result Date: 05/15/2021 ? LOWER EXTREMITY DOPPLER STUDY Patient Name:  ALEXIA QUANT  Date of Exam:   05/15/2021 Medical Rec #: [id number]         Accession #:    [id number] Date of Birth: [birth year]         Patient Gender: M Patient Age:   6 years Exam Location:  [facility] Procedure:      VAS [place] ABI WITH/WO TBI Referring Phys: Lisbeth Renshaw DUNN --------------------------------------------------------------------------------  Indications: Ulceration right shin and lateral foot. High Risk Factors:  Hypertension, hyperlipidemia, coronary artery disease. Other Factors: NICM.  Limitations: Today's exam was limited due to frequent PVCs. Comparison Study: No prior study on file Performing Technologist: Mauro Kaufmann,

## 2021-05-15 NOTE — Progress Notes (Signed)
? ?PROGRESS NOTE ? ? ? ?Matthew [name]  ?[id number]: 1940/07/08  ?DOA: 05/13/2021 ?PCP: Pcp, No ?Outpatient Specialists: ? ? ?Hospital course: ? ?Briefly patient is a 81 year old man with nonischemic cardiomyopathy and chronic HFrEF, CAD, CKD 3B, HTN, severe MR and thrombocytopenia who was seen in heart failure clinic and was supposed to have right heart cath but had shortness of breath and was found to have PE.  Patient has been treated with IV heparin.  Hospital course complicated by some hypotension, cardiology was called who noted RV strain which was probably chronic given known right heart failure.  Patient is being treated with Lasix with improvement in symptoms. ? ? ?Subjective: ? ?Patient states he feels much better today than he did yesterday.  Notes he has been urinating a lot to the Lasix.  Notes his breathing is so much better than it has been for a while. ? ? ?Objective: ?Vitals:  ? 05/15/21 0429 05/15/21 0822 05/15/21 0900 05/15/21 1325  ?BP: 103/79 109/63 104/72 (!) 89/61  ?Pulse: 98 100 97 94  ?Resp: 17 19 (!) 23 20  ?Temp: 98.1 ?F (36.7 ?C)   97.9 ?F (36.6 ?C)  ?TempSrc: Oral   Oral  ?SpO2: 97% 99% 98% 97%  ?Weight:      ?Height:      ? ? ?Intake/Output Summary (Last 24 hours) at 05/15/2021 1606 ?Last data filed at 05/15/2021 1300 ?Gross per 24 hour  ?Intake 392.88 ml  ?Output 4100 ml  ?Net -3707.12 ml  ? ?Filed Weights  ? 05/14/21 [id number]  ?Weight: 99.9 kg  ? ? ? ?Exam: ? ?General: Patient in good spirits sitting up in bed with attentive daughter Mechele Claude at bedside ?Eyes: sclera anicteric, conjuctiva mild injection bilaterally ?CVS: S1-S2, regular  ?Respiratory:  decreased air entry bilaterally secondary to decreased inspiratory effort, rales at bases  ?GI: NABS, soft, NT  ?LE: Trace edema.  ?Neuro: A/O x 3, Moving all extremities equally with normal strength, CN 3-12 intact, grossly nonfocal.  ?Psych: patient is logical and coherent, judgement and insight appear normal, mood and affect  appropriate to situation. ? ? ?Assessment & Plan: ?  ?Acute pulmonary embolus ?Segmental and subsegmental PE seen with distended right ventricle and possible right heart strain.  Patient seen by cardiology who feel the right heart strain is more likely to be chronic than acute.  No hypoxia or hypotension. ?Patient has been on IV heparin for 24 hours ?Will change to p.o. Eliquis ? ?Acute on chronic biventricular heart failure severe MR ?Very much appreciate ongoing cardiology consultation ?Patient has responded well to Lasix, feels much improved ? ?Frequent PVCs ?Not uncommon in people with heart failure ?Asymptomatic ?Potassium and magnesium are supplemented and WNL ?This was seen at previous admissions as well per cardiology ?No further work-up warranted ? ?CKD 3B ?Stable ? ?PVD ?Patient has wound on lower extremity, cardiology recommends lower extremity arterial studies evaluate for PAD.  Will defer this to outpatient given acute PE and ongoing anticoagulation at present. ? ?AAA ?Follow-up as an outpatient ? ?DVT prophylaxis: Eliquis ?Code Status: Full ?Family Communication: Patient's daughter Mechele Claude was at bedside throughout ?Disposition Plan:  ? Patient is from: Home ? Anticipated Discharge Location: Home ? Barriers to Discharge: Ongoing diuresis and anticoagulation ? Is patient medically stable for Discharge: Not yet ? ? ?Scheduled Meds: ? apixaban  10 mg Oral BID  ? Followed by  ? [START ON 05/22/2021] apixaban  5 mg Oral BID  ? furosemide  40 mg Intravenous  BID  ? magnesium oxide  400 mg Oral Daily  ? ?Continuous Infusions: ? ?Data Reviewed: ? ?Basic Metabolic Panel: ?Recent Labs  ?Lab 05/09/21 ?[id number] 05/10/21 ?0205 05/13/21 ?1223 05/13/21 ?2142 05/14/21 ?1626 05/15/21 ?0326 05/15/21 ?1251  ?NA 137 136 136 135 134* 136 136  ?K 3.7 4.3 5.0 4.9 4.5 4.4 4.5  ?CL 107 106 102 105 103 101 102  ?CO2 22 22 25  20* 22 26 27   ?GLUCOSE 95 112* 91 109* 111* 93 106*  ?BUN 16 13 18 19 21 18 19   ?CREATININE 1.34* 1.33* 1.42*  1.42* 1.47* 1.39* 1.49*  ?CALCIUM 8.6* 8.7* 9.3 9.0 8.7* 8.7* 8.8*  ?MG 1.9 2.1  --   --  1.8  --  1.8  ? ? ?CBC: ?Recent Labs  ?Lab 05/09/21 ?[id number] 05/10/21 ?0205 05/13/21 ?1223 05/13/21 ?2142 05/15/21 ?[id number]  ?WBC 4.9 4.5 4.3 4.6 4.8  ?NEUTROABS 3.2 2.9  --   --   --   ?HGB 12.0* 12.0* 12.9* 12.6* 11.8*  ?HCT 36.5* 37.1* 41.1 38.9* 36.8*  ?MCV 78.3* 78.8* 80.3 79.6* 79.3*  ?PLT 112* 104* 104* 118* 110*  ? ? ?Studies: ?DG Chest 2 View ? ?Result Date: 05/13/2021 ?CLINICAL DATA:  cp EXAM: CHEST - 2 VIEW COMPARISON:  Chest x-ray 05/07/2021 FINDINGS: Similar-appearing cardiomegaly. The heart and mediastinal contours are unchanged. Aortic calcification. Retrocardiac patchy airspace opacity. No pulmonary edema. Trace left pleural effusion. No pneumothorax. No acute osseous abnormality. IMPRESSION: 1. Retrocardiac patchy airspace opacity. Followup PA and lateral chest X-ray is recommended in 3-4 weeks following trial of antibiotic therapy to ensure resolution and exclude underlying malignancy. 2. Trace left pleural effusion. 3.  Aortic Atherosclerosis (ICD10-I70.0). Electronically Signed   By: Iven Finn M.D.   On: 05/13/2021 23:17  ? ?CT Angio Chest PE W and/or Wo Contrast ? ?Addendum Date: 05/14/2021   ?ADDENDUM REPORT: 05/14/2021 02:57 ADDENDUM: Critical findings were reported to PA Rayna Sexton at 2:55 a.m. Electronically Signed   By: Brett Fairy M.D.   On: 05/14/2021 02:57  ? ?Result Date: 05/14/2021 ?CLINICAL DATA:  Chest pain and shortness of breath. EXAM: CT ANGIOGRAPHY CHEST WITH CONTRAST TECHNIQUE: Multidetector CT imaging of the chest was performed using the standard protocol during bolus administration of intravenous contrast. Multiplanar CT image reconstructions and MIPs were obtained to evaluate the vascular anatomy. RADIATION DOSE REDUCTION: This exam was performed according to the departmental dose-optimization program which includes automated exposure control, adjustment of the mA and/or kV according  to patient size and/or use of iterative reconstruction technique. CONTRAST:  149mL OMNIPAQUE IOHEXOL 350 MG/ML SOLN COMPARISON:  None. FINDINGS: Cardiovascular: The heart is enlarged and multi-vessel coronary artery calcifications are noted. There is atherosclerotic calcification of the aorta with aneurysmal dilatation of the ascending aorta measuring 4.5 cm. The pulmonary trunk is distended which may be associated with underlying pulmonary artery hypertension. Pulmonary artery filling defects are present in the segmental and subsegmental arteries in the right lower lobe. Evaluation of the subsegmental arteries at the left lung base is limited due to respiratory motion artifact. The right ventricle is distended suggesting underlying right heart strain. Mediastinum/Nodes: Calcified lymph nodes are present in the mediastinum and hilar regions bilaterally. No axillary lymphadenopathy. The thyroid gland is heterogeneous with a few subcentimeter hypodensities in the left lobe. The trachea and esophagus are within normal limits. Lungs/Pleura: There are small bilateral pleural effusions, greater on the right than on the left. There is mild atelectasis at the lung bases. Mild fibrotic changes are noted in  the lungs bilaterally. No pneumothorax. Calcified granuloma are noted bilaterally. Upper Abdomen: There is reflux of contrast into the inferior vena cava and hepatic veins suggesting right heart failure. Musculoskeletal: Gynecomastia and mild anasarca is noted. Degenerative changes in the thoracic spine. No acute osseous abnormality. Review of the MIP images confirms the above findings. IMPRESSION: 1. Segmental and subsegmental pulmonary artery filling defects in the right lower lobe. The right ventricle is distended which may be associated with underlying right heart strain. There is also reflux of contrast into the inferior vena cava and hepatic veins which may be associated with right heart failure. 2. Small bilateral  pleural effusions with atelectasis at the lung bases 3. Distended pulmonary trunk suggesting underlying pulmonary artery hypertension. 4. Aortic atherosclerosis with aneurysmal dilatation of the ascending aorta me

## 2021-05-15 NOTE — Progress Notes (Signed)
ANTICOAGULATION CONSULT NOTE - Follow Up Consult ? ?Pharmacy Consult for Heparin >> Eliquis ?Indication: pulmonary embolus ? ?Allergies  ?Allergen Reactions  ? Lactose Intolerance (Gi) Other (See Comments)  ?  unknown  ? ? ?Patient Measurements: ?Height: 6\' 7"  (200.7 cm) ?Weight: 99.9 kg (220 lb 3.8 oz) ?IBW/kg (Calculated) : 93.7 ?Heparin Dosing Weight: 99.9 kg ? ?Vital Signs: ?Temp: 98.1 ?F (36.7 ?C) (04/02 0429) ?Temp Source: Oral (04/02 0429) ?BP: 104/72 (04/02 0900) ?Pulse Rate: 97 (04/02 0900) ? ?Labs: ?Recent Labs  ?  05/13/21 ?1223 05/13/21 ?2142 05/14/21 ?0005 05/14/21 ?1418 05/14/21 ?1626 05/14/21 ?2141 05/15/21 ?0326  ?HGB 12.9* 12.6*  --   --   --   --  11.8*  ?HCT 41.1 38.9*  --   --   --   --  36.8*  ?PLT 104* 118*  --   --   --   --  110*  ?HEPARINUNFRC  --   --   --  0.51  --  0.54 0.54  ?CREATININE 1.42* 1.42*  --   --  1.47*  --  1.39*  ?TROPONINIHS  --  14 12  --   --   --   --   ? ? ? ?Estimated Creatinine Clearance: 56.2 mL/min (A) (by C-G formula based on SCr of 1.39 mg/dL (H)). ? ? ?Medications:  ?Scheduled:  ? furosemide  40 mg Intravenous BID  ? magnesium oxide  400 mg Oral Daily  ? ?Infusions:  ? heparin 1,800 Units/hr (05/15/21 0519)  ? ? ?Assessment: ?81 yo M with extensive PMH found to have PE. No anticoagulation PTA. Pharmacy consulted for Eliquis dosing.  ? ?Patient has been therapeutic on IV heparin for ~ 24 hours. Per Dr. Jamse Arn - ok to discontinue heparin and start Eliquis.  ? ?Goal of Therapy:  ?Heparin level 0.3-0.7 units/ml ?Monitor platelets by anticoagulation protocol: Yes ?  ?Plan:  ?Stop IV heparin.  ?Start Eliquis 10 mg PO BID x 7 days, then 5 mg PO BID.  ?Monitor CBC and for signs/symptoms of bleeding. ? ? ?Vance Peper, PharmD ?PGY1 Pharmacy Resident ?Phone [phone] ?05/15/2021 12:07 PM  ? ?Please check AMION for all [place] phone numbers ?After 10:00 PM, call [place] [phone] ? ? ? ?

## 2021-05-15 NOTE — Progress Notes (Signed)
Notified by nurse re: 21 beats NSVT. Dr. Gardiner Rhyme was nearby at the time of message, no new medication changes recommended except to check lytes. K wnl, Mg 1.8. Already getting oral magnesium 400mg  daily, got dose this AM. Has known CKD so will give another 1g mag sulfate now. Recheck lytes in AM. ?

## 2021-05-15 NOTE — Progress Notes (Signed)
VASCULAR LAB ? ? ? ?ABIs have been performed. ? ?See CV proc for preliminary results. ? ? ?Edythe Riches, RVT ?05/15/2021, 9:32 AM ? ?

## 2021-05-15 NOTE — Progress Notes (Signed)
BP noted to be 82/60 this afternoon, asymptomatic, A+Ox3, sitting up, eating dinner. Has had an uneventful day thus far with good UOP, engaging with pastor this afternoon. Otherwise VS are similar to recent values. Recheck BP 30 min later was 95/64 which is similar to recent baseline. No acute complaints. Will hold PM dose of Lasix and subsequent dosing pending reassessment by rounding team in AM. Nurse will continue to trend BP and notify if downtrending again or any change in clinical status. Will also place request to cardmaster to discuss Advanced HF team picking up in consult tomorrow as previously suggested. ?

## 2021-05-15 NOTE — Progress Notes (Signed)
Patients bp is 82/60, he is asymptomatic. I notified Dayna Dunn. Patient is alert and oriented and feeling well. Sitting up eating dinner. Will recheck blood pressure and notify PA as needed. ? ?

## 2021-05-16 ENCOUNTER — Inpatient Hospital Stay: Payer: Self-pay

## 2021-05-16 ENCOUNTER — Other Ambulatory Visit (HOSPITAL_COMMUNITY): Payer: Self-pay

## 2021-05-16 DIAGNOSIS — I5043 Acute on chronic combined systolic (congestive) and diastolic (congestive) heart failure: Secondary | ICD-10-CM | POA: Diagnosis not present

## 2021-05-16 DIAGNOSIS — I2699 Other pulmonary embolism without acute cor pulmonale: Secondary | ICD-10-CM | POA: Diagnosis not present

## 2021-05-16 LAB — CBC
HCT: 35.8 % — ABNORMAL LOW (ref 39.0–52.0)
Hemoglobin: 11.7 g/dL — ABNORMAL LOW (ref 13.0–17.0)
MCH: 25.7 pg — ABNORMAL LOW (ref 26.0–34.0)
MCHC: 32.7 g/dL (ref 30.0–36.0)
MCV: 78.5 fL — ABNORMAL LOW (ref 80.0–100.0)
Platelets: 148 10*3/uL — ABNORMAL LOW (ref 150–400)
RBC: 4.56 MIL/uL (ref 4.22–5.81)
RDW: 21.2 % — ABNORMAL HIGH (ref 11.5–15.5)
WBC: 5 10*3/uL (ref 4.0–10.5)
nRBC: 0 % (ref 0.0–0.2)

## 2021-05-16 LAB — COOXEMETRY PANEL
Carboxyhemoglobin: 1.9 % — ABNORMAL HIGH (ref 0.5–1.5)
Methemoglobin: 0.9 % (ref 0.0–1.5)
O2 Saturation: 70.2 %
Total hemoglobin: 13.4 g/dL (ref 12.0–16.0)

## 2021-05-16 LAB — BASIC METABOLIC PANEL
Anion gap: 5 (ref 5–15)
BUN: 18 mg/dL (ref 8–23)
CO2: 28 mmol/L (ref 22–32)
Calcium: 8.6 mg/dL — ABNORMAL LOW (ref 8.9–10.3)
Chloride: 102 mmol/L (ref 98–111)
Creatinine, Ser: 1.43 mg/dL — ABNORMAL HIGH (ref 0.61–1.24)
GFR, Estimated: 50 mL/min — ABNORMAL LOW (ref >60)
Glucose, Bld: 88 mg/dL (ref 70–99)
Potassium: 4.6 mmol/L (ref 3.5–5.1)
Sodium: 135 mmol/L (ref 135–145)

## 2021-05-16 LAB — MAGNESIUM: Magnesium: 2 mg/dL (ref 1.7–2.4)

## 2021-05-16 MED ORDER — FUROSEMIDE 10 MG/ML IJ SOLN
80.0000 mg | Freq: Two times a day (BID) | INTRAMUSCULAR | Status: DC
  Administered 2021-05-16 – 2021-05-17 (×3): 80 mg via INTRAVENOUS
  Filled 2021-05-16 (×3): qty 8

## 2021-05-16 MED ORDER — SODIUM CHLORIDE 0.9% FLUSH
10.0000 mL | INTRAVENOUS | Status: DC | PRN
  Administered 2021-05-25: 10 mL

## 2021-05-16 MED ORDER — MEDIHONEY WOUND/BURN DRESSING EX PSTE
1.0000 | PASTE | Freq: Every day | CUTANEOUS | Status: DC
Start: 2021-05-16 — End: 2021-06-08
  Administered 2021-05-16 – 2021-06-08 (×23): 1 via TOPICAL
  Filled 2021-05-16 (×3): qty 44

## 2021-05-16 MED ORDER — SPIRONOLACTONE 12.5 MG HALF TABLET
12.5000 mg | ORAL_TABLET | Freq: Every day | ORAL | Status: DC
  Administered 2021-05-16 – 2021-05-17 (×2): 12.5 mg via ORAL
  Filled 2021-05-16 (×3): qty 1

## 2021-05-16 MED ORDER — MIDODRINE HCL 5 MG PO TABS
5.0000 mg | ORAL_TABLET | Freq: Three times a day (TID) | ORAL | Status: DC
  Administered 2021-05-16 – 2021-05-24 (×24): 5 mg via ORAL
  Filled 2021-05-16 (×25): qty 1

## 2021-05-16 MED ORDER — AMIODARONE HCL 200 MG PO TABS
200.0000 mg | ORAL_TABLET | Freq: Two times a day (BID) | ORAL | Status: DC
  Administered 2021-05-16 – 2021-05-20 (×9): 200 mg via ORAL
  Filled 2021-05-16 (×9): qty 1

## 2021-05-16 MED ORDER — POTASSIUM CHLORIDE CRYS ER 20 MEQ PO TBCR: 40.0000 meq | EXTENDED_RELEASE_TABLET | Freq: Once | ORAL | Status: DC

## 2021-05-16 MED ORDER — CHLORHEXIDINE GLUCONATE CLOTH 2 % EX PADS
6.0000 | MEDICATED_PAD | Freq: Every day | CUTANEOUS | Status: DC
  Administered 2021-05-16 – 2021-06-08 (×22): 6 via TOPICAL

## 2021-05-16 NOTE — Evaluation (Signed)
Physical Therapy Evaluation ?Patient Details ?Name: Matthew [name] ?MRN: [id number] ?DOB: 1940-05-22 ?Today's Date: 05/16/2021 ? ?History of Present Illness ? 81 y.o. male adm 3/31 with SOB with RLL PE treated with Heparin> Eliquis. Admission 3/25-28/2023 with AMS and UTI. PMHx: CAD, NICM, CHF, HTN, OA, obesity  ?Clinical Impression ? Pt pleasant with flat affect and able to transition OOB to chair but denied gait. Pt reports he can normally get his socks on but required max assist and this session and did not attempt. Pt with good ability to perform limited mobility with decreased activity tolerance and strength who will benefit from acute therapy to maximize mobility, safety and function.  ? ?Max HR 117 ?SpO2 100% on RA ?   ? ?Recommendations for follow up therapy are one component of a multi-disciplinary discharge planning process, led by the attending physician.  Recommendations may be updated based on patient status, additional functional criteria and insurance authorization. ? ?Follow Up Recommendations Home health PT ? ?  ?Assistance Recommended at Discharge Intermittent Supervision/Assistance  ?Patient can return home with the following ? Assistance with cooking/housework;Assist for transportation;Help with stairs or ramp for entrance;A little help with walking and/or transfers ? ?  ?Equipment Recommendations None recommended by PT  ?Recommendations for Other Services ?    ?  ?Functional Status Assessment Patient has had a recent decline in their functional status and demonstrates the ability to make significant improvements in function in a reasonable and predictable amount of time.  ? ?  ?Precautions / Restrictions Precautions ?Precautions: Fall  ? ?  ? ?Mobility ? Bed Mobility ?Overal bed mobility: Needs Assistance ?Bed Mobility: Supine to Sit ?  ?  ?Supine to sit: Min assist, HOB elevated ?  ?  ?General bed mobility comments: HOB 15 degrees with hand held assist to elevate trunk ?  ? ?Transfers ?Overall  transfer level: Needs assistance ?  ?Transfers: Sit to/from Stand, Bed to chair/wheelchair/BSC ?Sit to Stand: Modified independent (Device/Increase time) ?  ?Step pivot transfers: Supervision ?  ?  ?  ?General transfer comment: supervision for lines, flexed trunk ?  ? ?Ambulation/Gait ?  ?  ?  ?  ?  ?  ?  ?General Gait Details: pt denied attempting ? ?Stairs ?  ?  ?  ?  ?  ? ?Wheelchair Mobility ?  ? ?Modified Rankin (Stroke Patients Only) ?  ? ?  ? ?Balance Overall balance assessment: Needs assistance ?Sitting-balance support: Feet supported, No upper extremity supported ?Sitting balance-[name] Scale: Fair ?  ?  ?Standing balance support: Bilateral upper extremity supported, Reliant on assistive device for balance ?Standing balance-[name] Scale: Poor ?Standing balance comment: RW use for gait ?  ?  ?  ?  ?  ?  ?  ?  ?  ?  ?  ?   ? ? ? ?Pertinent Vitals/Pain Pain Assessment ?Pain Assessment: No/denies pain  ? ? ?Home Living Family/patient expects to be discharged to:: Private residence ?Living Arrangements: Children;Other (Comment) ?Available Help at Discharge: Family;Available 24 hours/day ?Type of Home: House ?Home Access: Ramped entrance ?  ?  ?  ?Home Layout: One level ?Home Equipment: Rollator (4 wheels);BSC/3in1;Shower seat ?   ?  ?Prior Function Prior Level of Function : Needs assist ?  ?  ?  ?Physical Assist : Mobility (physical);ADLs (physical) ?  ?  ?Mobility Comments: walks with rollator in home ?ADLs Comments: pt reports mod I for bathing and dressing, family performing cooking/cleaning ?  ? ? ?Hand Dominance  ?   ? ?  ?  Extremity/Trunk Assessment  ? Upper Extremity Assessment ?Upper Extremity Assessment: Generalized weakness ?  ? ?Lower Extremity Assessment ?Lower Extremity Assessment: Generalized weakness ?  ? ?Cervical / Trunk Assessment ?Cervical / Trunk Assessment: Kyphotic  ?Communication  ? Communication: HOH  ?Cognition Arousal/Alertness: Awake/alert ?Behavior During Therapy: Flat affect ?Overall  Cognitive Status: Within Functional Limits for tasks assessed ?  ?  ?  ?  ?  ?  ?  ?  ?  ?  ?  ?  ?  ?  ?  ?  ?  ?  ?  ? ?  ?General Comments   ? ?  ?Exercises General Exercises - Lower Extremity ?Long Arc Quad: AROM, Both, Seated, 10 reps ?Hip Flexion/Marching: AROM, Both, 10 reps, Seated  ? ?Assessment/Plan  ?  ?PT Assessment Patient needs continued PT services  ?PT Problem List Decreased strength;Decreased activity tolerance;Decreased balance;Decreased mobility ? ?   ?  ?PT Treatment Interventions DME instruction;Gait training;Functional mobility training;Balance training;Therapeutic activities;Therapeutic exercise;Neuromuscular re-education;Patient/family education;Wheelchair mobility training   ? ?PT Goals (Current goals can be found in the Care Plan section)  ?Acute Rehab PT Goals ?Patient Stated Goal: pt agreeable to wanting to return home ?PT Goal Formulation: With patient ?Time For Goal Achievement: 05/30/21 ?Potential to Achieve Goals: Fair ? ?  ?Frequency Min 3X/week ?  ? ? ?Co-evaluation   ?  ?  ?  ?  ? ? ?  ?AM-PAC PT "6 Clicks" Mobility  ?Outcome Measure Help needed turning from your back to your side while in a flat bed without using bedrails?: A Little ?Help needed moving from lying on your back to sitting on the side of a flat bed without using bedrails?: A Little ?Help needed moving to and from a bed to a chair (including a wheelchair)?: A Little ?Help needed standing up from a chair using your arms (e.g., wheelchair or bedside chair)?: A Little ?Help needed to walk in hospital room?: Total ?Help needed climbing 3-5 steps with a railing? : Total ?6 Click Score: 14 ? ?  ?End of Session   ?Activity Tolerance: Patient tolerated treatment well ?Patient left: in chair;with call bell/phone within reach;with chair alarm set ?Nurse Communication: Mobility status ?PT Visit Diagnosis: Other abnormalities of gait and mobility (R26.89);Muscle weakness (generalized) (M62.81) ?  ? ?Time: 2683-4196 ?PT Time  Calculation (min) (ACUTE ONLY): 18 min ? ? ?Charges:   PT Evaluation ?$PT Eval Moderate Complexity: 1 Mod ?  ?  ?   ? ? ?Darien Mignogna P, PT ?Acute Rehabilitation Services ?Pager: [phone] ?Office: [phone] ? ? ?Averil Digman B Kumari Sculley ?05/16/2021, 1:45 PM ? ?

## 2021-05-16 NOTE — TOC Benefit Eligibility Note (Signed)
Patient Advocate Encounter ? ?Insurance verification completed.   ? ?The patient is currently admitted and upon discharge could be taking Eliquis 5 mg. ? ?The current 30 day co-pay is, $47.00.  ? ?The patient is insured through [employer]  ? ? ? ?Avamarie Crossley, CPhT ?Pharmacy Patient Advocate Specialist ?[facility] Patient Advocate Team ?Direct Number: [phone]  Fax: [phone] ? ? ? ? ? ?  ?

## 2021-05-16 NOTE — TOC CM/SW Note (Signed)
HF TOC CM HH arranged with [facility]. Will continue to follow for dc needs. Isidoro Donning RN3 CCM, Heart Failure TOC CM [phone]  ?

## 2021-05-16 NOTE — Progress Notes (Signed)
? ?PROGRESS NOTE ? ? ? ?Matthew [name]  ?[id number]: Apr 17, 1940  ?DOA: 05/13/2021 ?PCP: Pcp, No ?Outpatient Specialists: ? ? ?Hospital course: ? ?Briefly patient is a 81 year old man with nonischemic cardiomyopathy and chronic HFrEF, CAD, CKD 3B, HTN, severe MR and thrombocytopenia who was seen in heart failure clinic and was supposed to have right heart cath but had shortness of breath and was found to have PE.  Patient has been treated with IV heparin.  Hospital course complicated by some hypotension, cardiology was called who noted RV strain which was probably chronic given known right heart failure.  Patient is being treated with Lasix with improvement in symptoms. ? ? ?Subjective: ? ?Patient appreciative of cardiology input.  States he continues to feel better.  Understands discussion about having right heart cath with cardiology.  No acute concerns. ? ? ?Objective: ?Vitals:  ? 05/16/21 0734 05/16/21 1128 05/16/21 1340 05/16/21 1358  ?BP: 91/73 93/70 91/71  91/71  ?Pulse: (!) 103 98 97 97  ?Resp: (!) 22 (!) 22 20 (!) 25  ?Temp: 98.4 ?F (36.9 ?C) 98.4 ?F (36.9 ?C) 99.2 ?F (37.3 ?C) 99.2 ?F (37.3 ?C)  ?TempSrc: Oral Oral  Oral  ?SpO2: 100% 100% 100% 100%  ?Weight:      ?Height:      ? ? ?Intake/Output Summary (Last 24 hours) at 05/16/2021 1748 ?Last data filed at 05/16/2021 1532 ?Gross per 24 hour  ?Intake 1200 ml  ?Output 3850 ml  ?Net -2650 ml  ? ? ?Filed Weights  ? 05/14/21 [id number]  ?Weight: 99.9 kg  ? ? ? ?Exam: ? ?General: Patient in good spirits sitting up in bed with attentive daughter Matthew [name] at bedside ?Eyes: sclera anicteric, conjuctiva mild injection bilaterally ?CVS: S1-S2, regular  ?Respiratory:  decreased air entry bilaterally secondary to decreased inspiratory effort, rales at bases  ?GI: NABS, soft, NT  ?LE: Trace edema.  ?Neuro: A/O x 3, Moving all extremities equally with normal strength, CN 3-12 intact, grossly nonfocal.  ?Psych: patient is logical and coherent, judgement and insight appear  normal, mood and affect appropriate to situation. ? ? ?Assessment & Plan: ?  ?Acute pulmonary embolus ?Segmental and subsegmental PE seen with distended right ventricle and possible right heart strain.  Patient seen by cardiology who feel the right heart strain is more likely to be chronic than acute.  No hypoxia or hypotension. ?Continue Eliquis ? ?Acute on chronic biventricular heart failure severe MR ?Very much appreciate ongoing cardiology consultation ?Patient has responded well to Lasix, feels much improved ?Spironolactone and midodrine also added per cardiology ?Plan for RHC/LHC and TEE as part of MitraClip work-up, Eliquis will need to be changed to heparin per their schedule. ? ?Frequent PVCs ?Not uncommon in people with heart failure ?Asymptomatic ?Potassium and magnesium are supplemented and WNL ?Amiodarone added per cardiology ? ?CKD 3B ?Stable ? ?PVD ?Patient has wound on lower extremity, cardiology recommends lower extremity arterial studies evaluate for PAD.  Will defer this to outpatient given acute PE and ongoing anticoagulation at present. ? ?AAA ?Follow-up as an outpatient ? ?DVT prophylaxis: Eliquis ?Code Status: Full ?Family Communication: Patient's daughter Matthew [name] was at bedside throughout ?Disposition Plan:  ? Patient is from: Home ? Anticipated Discharge Location: Home ? Barriers to Discharge: Ongoing diuresis and anticoagulation ? Is patient medically stable for Discharge: Not yet ? ? ?Scheduled Meds: ? amiodarone  200 mg Oral BID  ? apixaban  10 mg Oral BID  ? Followed by  ? [START ON 05/22/2021] apixaban  5 mg Oral BID  ? Chlorhexidine Gluconate Cloth  6 each Topical Daily  ? furosemide  80 mg Intravenous BID  ? leptospermum manuka honey  1 application. Topical Daily  ? magnesium oxide  400 mg Oral Daily  ? midodrine  5 mg Oral TID WC  ? spironolactone  12.5 mg Oral Daily  ? ?Continuous Infusions: ? ?Data Reviewed: ? ?Basic Metabolic Panel: ?Recent Labs  ?Lab 05/10/21 ?0205 05/13/21 ?1223  05/13/21 ?2142 05/14/21 ?1626 05/15/21 ?0326 05/15/21 ?1251 05/16/21 ?0145  ?NA 136   < > 135 134* 136 136 135  ?K 4.3   < > 4.9 4.5 4.4 4.5 4.6  ?CL 106   < > 105 103 101 102 102  ?CO2 22   < > 20* 22 26 27 28   ?GLUCOSE 112*   < > 109* 111* 93 106* 88  ?BUN 13   < > 19 21 18 19 18   ?CREATININE 1.33*   < > 1.42* 1.47* 1.39* 1.49* 1.43*  ?CALCIUM 8.7*   < > 9.0 8.7* 8.7* 8.8* 8.6*  ?MG 2.1  --   --  1.8  --  1.8 2.0  ? < > = values in this interval not displayed.  ? ? ? ?CBC: ?Recent Labs  ?Lab 05/10/21 ?0205 05/13/21 ?1223 05/13/21 ?2142 05/15/21 ?0326 05/16/21 ?0145  ?WBC 4.5 4.3 4.6 4.8 5.0  ?NEUTROABS 2.9  --   --   --   --   ?HGB 12.0* 12.9* 12.6* 11.8* 11.7*  ?HCT 37.1* 41.1 38.9* 36.8* 35.8*  ?MCV 78.8* 80.3 79.6* 79.3* 78.5*  ?PLT 104* 104* 118* 110* 148*  ? ? ? ?Studies: ?VAS [place] ABI WITH/WO TBI ? ?Result Date: 05/15/2021 ? LOWER EXTREMITY DOPPLER STUDY Patient Name:  Matthew [name]  Date of Exam:   05/15/2021 Medical Rec #: [id number]         Accession #:    [id number] Date of Birth: [birth year]         Patient Gender: M Patient Age:   77 years Exam Location[facility]  [facility] Procedure:      VAS [place] ABI WITH/WO TBI Referring Phys: Lisbeth Renshaw DUNN --------------------------------------------------------------------------------  Indications: Ulceration right shin and lateral foot. High Risk Factors: Hypertension, hyperlipidemia, coronary artery disease. Other Factors: NICM.  Limitations: Today's exam was limited due to frequent PVCs. Comparison Study: No prior study on file Performing Technologist: Sharion Dove RVS  Examination Guidelines: A complete evaluation includes at minimum, Doppler waveform signals and systolic blood pressure reading at the level of bilateral brachial, anterior tibial, and posterior tibial arteries, when vessel segments are accessible. Bilateral testing is considered an integral part of a complete examination. Photoelectric Plethysmograph (PPG) waveforms and toe systolic pressure readings  are included as required and additional duplex testing as needed. Limited examinations for reoccurring indications may be performed as noted.  ABI Findings: +---------+------------------+-----+-----------+--------+ Right    Rt Pressure (mmHg)IndexWaveform   Comment  +---------+------------------+-----+-----------+--------+ Brachial 102                    multiphasic         +---------+------------------+-----+-----------+--------+ PTA      156               1.49 multiphasic         +---------+------------------+-----+-----------+--------+ DP       75                0.71 multiphasic         +---------+------------------+-----+-----------+--------+  Great Toe75                0.71                     +---------+------------------+-----+-----------+--------+ +---------+------------------+-----+-----------+-------+ Left     Lt Pressure (mmHg)IndexWaveform   Comment +---------+------------------+-----+-----------+-------+ Brachial 105                    multiphasic        +---------+------------------+-----+-----------+-------+ PTA      159               1.51 multiphasic        +---------+------------------+-----+-----------+-------+ DP       98                0.93 multiphasic        +---------+------------------+-----+-----------+-------+ Great Toe80                0.76                    +---------+------------------+-----+-----------+-------+ +-------+-----------+-----------+------------+------------+ ABI/TBIToday's ABIToday's TBIPrevious ABIPrevious TBI +-------+-----------+-----------+------------+------------+ Right  1.49       0.71                                +-------+-----------+-----------+------------+------------+ Left   1.51       0.76                                +-------+-----------+-----------+------------+------------+ Arterial wall calcification precludes accurate ankle pressures and ABIs.  Summary: Right: Resting right  ankle-brachial index indicates noncompressible right lower extremity arteries, normal waveforms. The right toe-brachial index is normal. Left: Resting left ankle-brachial index indicates noncompressible left lower extremity arteries, normal waveforms. The left toe-brachial index is no

## 2021-05-16 NOTE — Consult Note (Addendum)
?  ?Advanced Heart Failure Team Consult Note ? ? ?Primary Physician: Pcp, No ?PCP-Cardiologist:  Larae Grooms, MD ?HF Cardiologist: Dr. Haroldine Laws ? ?Reason for Consultation: Acute on chronic biventricular heart failure ? ?HPI:   ? ?Matthew [name] is seen today for evaluation of acute on chronic biventricular heart failure at the request of Dr. Jamse Arn with [facility].  ? ?81 year old with h/o NICM/chronic systolic CHF, CAD, HTN, obesity. Has longstanding history of cardiomyopathy. EF previously improved to 50-55% in 2014. Had cath 2012 severe stenosis of the diagonal, which is not severe enough to cause his EF reduction. ?  ?Last saw [name] in 03/22. Echo repeat, EF down to 25-30%, RV mildly reduced, mild MR. He was referred to PharmD for titration of GDMT, last visit July 2022. ?  ?Admitted 03/2021 with a/c CHF. Had not been taking his CHF medications or diuretics several days prior to admission. BNP on admission was >4,500.  Echo LVEF 20 to 25%, RV severely enlarged and severely reduced, severe mitral and tricuspid regurgitation, severe BAE, severely elevated RVSP, 50 mmHg. Cardiology consulted. Diuresed with lasix drip+ metolazone.  Cardiology adjusted his HF meds and he was discharged on Lasix 20mg  BID, Coreg 25mg  BID, Entresto 24/26mg  BID, Spironolactone 25mg  daily, and farxiga 10mg  daily. ?  ?Admitted 05/08/21 with AKI and metabolic encephalopathy. Creatinine 1.65. This was felt to be in the setting of recent UTI and spironolactone. Given IV fluids. HF meds restarted. Discharged 05/10/21.  ? ?Seen in [facility] clinic on 05/13/21. Volume appeared elevated.  PO lasix increased. R/LHC + TEE arranged for additional workup of severely reduced EF and mitral regurgitation.  ? ?Presented to the ED on 05/13/21 with complaints of dyspnea and CP. He was not hypoxic on presentation. CXR with eveidence of retrocardiac airspace opacity. CTA chest demonstrated segmental and subsegmental pulmonary artery filling defects in  RLL, RV distended c/w possible RV strain, small pleural effusions, distended pulmonary strunk suggesting pulmonary artery hypertension, ascending aortic aneurysm measuring 4.5 cm. Labs significant for Scr 1.42, CO2 20, Hgb 12.6, MCV 79.6, platelet 118, HS troponin 14>12, BNP > 4,500. He was started on heparin gtt and given one 40 mg lasix IV. He was admitted to hospitalist service for management of PE and acute on chronic biventricular heart failure.  ? ?Echo 04/01: EF 25%, LV severely dilated, heavy trabeculations with no evidence of thrombus, RV severely enlarged, ?? Normal RV function, severe BAE, severe MR, severe TR, dilated IVC suggesting RAP > 15 mmHg ? ?Cardiology consulted. GDMT on hold d/t hypotension.  He was started on IV lasix 40 mg BID. Evening dose of IV lasix held on 04/02 d/t hypotension with SBP 80s-90s. ? ?Scr stable at 1.43 this am, K 4.6 and mag 2.0. SBP remains in 90s.  No weight the last 2 days. 3.7L UOP yesterday. ? ?Had 21 beat run NSVT yesterday. K 1.8, supplemented with 1 gm mag sulfate. ? ?Patient lives at home with his daughter, she assists him with medications. Ambulates with a Stitt. She has lost about 50 lb the last few years, reports good appetite. ? ? ?Review of Systems: [y] = yes, [ ]  = no  ? ?General: Weight gain [ ] ; Weight loss [Y]; Anorexia [ ] ; Fatigue [ ] ; Fever [ ] ; Chills [ ] ; Weakness [ ]   ?Cardiac: Chest pain/pressure [Y]; Resting SOB [Y]; Exertional SOB [Y]; Orthopnea [ ] ; Pedal Edema [ ] ; Palpitations [ ] ; Syncope [ ] ; Presyncope [ ] ; Paroxysmal nocturnal dyspnea[ ]   ?Pulmonary:  Cough [ ] ; Wheezing[ ] ; Hemoptysis[ ] ; Sputum [ ] ; Snoring [ ]   ?GI: Vomiting[ ] ; Dysphagia[ ] ; Melena[ ] ; Hematochezia [ ] ; Heartburn[ ] ; Abdominal pain [ ] ; Constipation [ ] ; Diarrhea [ ] ; BRBPR [ ]   ?GU: Hematuria[ ] ; Dysuria [ ] ; Nocturia[ ]   ?Vascular: Pain in legs with walking [ ] ; Pain in feet with lying flat [ ] ; Non-healing sores [ ] ; Stroke [ ] ; TIA [ ] ; Slurred speech [ ] ;  ?Neuro:  Headaches[ ] ; Vertigo[ ] ; Seizures[ ] ; Paresthesias[ ] ;Blurred vision [ ] ; Diplopia [ ] ; Vision changes [ ]   ?Ortho/Skin: Arthritis [ ] ; Joint pain [ ] ; Muscle pain [ ] ; Joint swelling [ ] ; Back Pain [ ] ; Rash [ ]   ?Psych: Depression[ ] ; Anxiety[ ]   ?Heme: Bleeding problems [ ] ; Clotting disorders [ ] ; Anemia [ ]   ?Endocrine: Diabetes [ ] ; Thyroid dysfunction[ ]  ? ?Home Medications ?Prior to Admission medications   ?Medication Sig Start Date End Date Taking? Authorizing Provider  ?acetaminophen (TYLENOL) 500 MG tablet Take 500 mg by mouth every 6 (six) hours as needed for moderate pain.   Yes [provider]  ?aspirin EC 81 MG tablet Take 1 tablet (81 mg total) by mouth daily. 03/29/17  Yes Jettie Booze, MD  ?atorvastatin (LIPITOR) 10 MG tablet TAKE 1 TABLET(10 MG) BY MOUTH DAILY ?Patient taking differently: Take 10 mg by mouth daily. 07/19/20  Yes Jettie Booze, MD  ?dapagliflozin propanediol (FARXIGA) 10 MG TABS tablet Take 1 tablet (10 mg total) by mouth daily. 07/09/20  Yes Jettie Booze, MD  ?furosemide (LASIX) 20 MG tablet Take 2 tablets (40 mg total) by mouth every morning AND 1 tablet (20 mg total) every evening. 05/13/21  Yes Clegg, Amy D, NP  ?metoprolol succinate (TOPROL-XL) 25 MG 24 hr tablet Take 1.5 tablets (37.5 mg total) by mouth at bedtime. 05/10/21  Yes Jettie Booze, MD  ?Misc Natural Products ([place]) CAPS Take 1 capsule by mouth in the morning and at bedtime.   Yes [provider]  ?sacubitril-valsartan (ENTRESTO) 24-26 MG Take 1 tablet by mouth 2 (two) times daily. 05/04/21  Yes Jettie Booze, MD  ?spironolactone (ALDACTONE) 25 MG tablet Take 1 tablet (25 mg total) by mouth at bedtime. 04/09/21  Yes Eppie Gibson, MD  ?traMADol (ULTRAM) 50 MG tablet Take 50 mg by mouth every 8 (eight) hours as needed for pain. 04/12/21  Yes [provider]  ? ? ?Past Medical History: ?Past Medical History:  ?Diagnosis Date  ? CAD (coronary artery  disease)   ? Chronic HFrEF (heart failure with reduced ejection fraction) ([place])   ? Chronic kidney disease, stage 3b ([place])   ? Encephalopathy 04/2021  ? admitted for metabolic encephalopathy  ? HTN (hypertension)   ? Hyperlipidemia   ? NICM (nonischemic cardiomyopathy) ([place])   ? Obesity   ? Osteoarthritis   ? Severe mitral regurgitation   ? Thoracic aortic aneurysm (TAA) ([place])   ? Thrombocytopenia ([place])   ? noted in labs in 2023  ? Tricuspid regurgitation   ? ? ?Past Surgical History: ?Past Surgical History:  ?Procedure Laterality Date  ? CHONDROPLASTY Left 01/11/2017  ? Procedure: CHONDROPLASTY of medial and patella compartment;  Surgeon: Dorna Leitz, MD;  Location: [place];  Service: Orthopedics;  Laterality: Left;  ? KNEE ARTHROSCOPY Left 01/11/2017  ? Procedure: ARTHROSCOPIC IRRIGATION AND DEBRIDMENT KNEE;  Surgeon: Dorna Leitz, MD;  Location: [place];  Service: Orthopedics;  Laterality: Left;  ? KNEE  SURGERY    ? MENISECTOMY Left 01/11/2017  ? Procedure: Lateral MENISECTOMY;  Surgeon: Dorna Leitz, MD;  Location: [place];  Service: Orthopedics;  Laterality: Left;  ? SYNOVECTOMY Left 01/11/2017  ? Procedure: Four compartment SYNOVECTOMY;  Surgeon: Dorna Leitz, MD;  Location: [place];  Service: Orthopedics;  Laterality: Left;  ? ? ?Family History: ?Family History  ?Problem Relation Age of Onset  ? Hypertension Father   ? Diabetes Brother   ? Heart attack Neg Hx   ? Stroke Neg Hx   ? ? ?Social History: ?Social History  ? ?Socioeconomic History  ? Marital status: Unknown  ?  Spouse name: Not on file  ? Number of children: Not on file  ? Years of education: Not on file  ? Highest education level: 11th grade  ?Occupational History  ? Occupation: retired  ?Tobacco Use  ? Smoking status: Never  ? Smokeless tobacco: Never  ?Vaping Use  ? Vaping Use: Never used  ?Substance and Sexual Activity  ? Alcohol use: No  ? Drug use: No  ? Sexual activity: Not on file  ?Other Topics Concern  ? Not on file  ?Social History Narrative  ?  Not on file  ? ?Social Determinants of Health  ? ?Financial Resource Strain: Low Risk   ? Difficulty of Paying Living Expenses: Not very hard  ?Food Insecurity: No Food Insecurity  ? Worried About [employer]

## 2021-05-16 NOTE — Consult Note (Signed)
WOC Nurse Consult Note: ?Reason for Consult:Patient with acute on chronic heart failure and Acute pulmonary embolus with nonhealing trauma wound to right lower leg.  Present x 3 weeks.  Bumped into his bed.   ?Wound type:trauma, nonhelaing ?Pressure Injury POA: NA ?Measurement: 1 cm x 1 cm round, distal wound 0.5 cm round and right lateral leg with 0.5 cm round wound ?Wound bed:all 100% fibrin ?Drainage (amount, consistency, odor) minimal serosanguinous  no odor.  ?Periwound:edema to bilateral lower legs.  ?ABI performed and legs are noncompressible.  ?Dressing procedure/placement/frequency: Cleanse wounds to right lower legs with NS and pat dry. Apply Medihoney to open wounds.  Cover with foam dressings.  Peel back foam and reapply daily.  Change foam every three days and PRN soilage.  ?Will not follow at this time.  Please re-consult if needed.  ?Maple Hudson MSN, RN, FNP-BC CWON ?Wound, Ostomy, Continence Nurse ?Pager [phone]  ? ? ?  ?

## 2021-05-16 NOTE — Progress Notes (Signed)
Peripherally Inserted Central Catheter Placement ? ?The IV Nurse has discussed with the patient and/or persons authorized to consent for the patient, the purpose of this procedure and the potential benefits and risks involved with this procedure.  The benefits include less needle sticks, lab draws from the catheter, and the patient may be discharged home with the catheter. Risks include, but not limited to, infection, bleeding, blood clot (thrombus formation), and puncture of an artery; nerve damage and irregular heartbeat and possibility to perform a PICC exchange if needed/ordered by physician.  Alternatives to this procedure were also discussed.  Bard Power PICC patient education guide, fact sheet on infection prevention and patient information card has been provided to patient /or left at bedside.   ? ?PICC Placement Documentation  ?PICC Double Lumen 05/16/21 Right Brachial 45 cm 0 cm (Active)  ?Indication for Insertion or Continuance of Line Vasoactive infusions 05/16/21 1500  ?Exposed Catheter (cm) 0 cm 05/16/21 1500  ?Site Assessment Clean, Dry, Intact 05/16/21 1500  ?Lumen #1 Status Flushed;Saline locked;Blood return noted 05/16/21 1500  ?Lumen #2 Status Flushed;Saline locked;Blood return noted 05/16/21 1500  ?Dressing Type Securing device;Transparent 05/16/21 1500  ?Dressing Status Antimicrobial disc in place 05/16/21 1500  ?Dressing Intervention New dressing;Other (Comment) 05/16/21 1500  ?Dressing Change Due 05/23/21 05/16/21 1500  ? ? ? ? ? ?Christella Noa Albarece ?05/16/2021, 3:02 PM ? ?

## 2021-05-17 DIAGNOSIS — I340 Nonrheumatic mitral (valve) insufficiency: Secondary | ICD-10-CM

## 2021-05-17 DIAGNOSIS — I2699 Other pulmonary embolism without acute cor pulmonale: Secondary | ICD-10-CM | POA: Diagnosis not present

## 2021-05-17 DIAGNOSIS — I5043 Acute on chronic combined systolic (congestive) and diastolic (congestive) heart failure: Secondary | ICD-10-CM | POA: Diagnosis not present

## 2021-05-17 LAB — COOXEMETRY PANEL
Carboxyhemoglobin: 2.1 % — ABNORMAL HIGH (ref 0.5–1.5)
Methemoglobin: <0.7 % (ref 0.0–1.5)
O2 Saturation: 76.3 %
Total hemoglobin: 13.7 g/dL (ref 12.0–16.0)

## 2021-05-17 LAB — BASIC METABOLIC PANEL
Anion gap: 8 (ref 5–15)
BUN: 19 mg/dL (ref 8–23)
CO2: 31 mmol/L (ref 22–32)
Calcium: 9.1 mg/dL (ref 8.9–10.3)
Chloride: 97 mmol/L — ABNORMAL LOW (ref 98–111)
Creatinine, Ser: 1.43 mg/dL — ABNORMAL HIGH (ref 0.61–1.24)
GFR, Estimated: 50 mL/min — ABNORMAL LOW (ref >60)
Glucose, Bld: 88 mg/dL (ref 70–99)
Potassium: 4.8 mmol/L (ref 3.5–5.1)
Sodium: 136 mmol/L (ref 135–145)

## 2021-05-17 LAB — CBC
HCT: 41.5 % (ref 39.0–52.0)
Hemoglobin: 13.2 g/dL (ref 13.0–17.0)
MCH: 25.1 pg — ABNORMAL LOW (ref 26.0–34.0)
MCHC: 31.8 g/dL (ref 30.0–36.0)
MCV: 78.9 fL — ABNORMAL LOW (ref 80.0–100.0)
Platelets: 159 10*3/uL (ref 150–400)
RBC: 5.26 MIL/uL (ref 4.22–5.81)
RDW: 21.4 % — ABNORMAL HIGH (ref 11.5–15.5)
WBC: 5.2 10*3/uL (ref 4.0–10.5)
nRBC: 0 % (ref 0.0–0.2)

## 2021-05-17 LAB — MAGNESIUM: Magnesium: 2 mg/dL (ref 1.7–2.4)

## 2021-05-17 MED ORDER — SODIUM CHLORIDE 0.9 % IV SOLN: 250.0000 mL | INTRAVENOUS | Status: DC | PRN

## 2021-05-17 MED ORDER — SODIUM CHLORIDE 0.9% FLUSH
3.0000 mL | Freq: Two times a day (BID) | INTRAVENOUS | Status: DC
  Administered 2021-05-17 – 2021-06-01 (×17): 3 mL via INTRAVENOUS

## 2021-05-17 MED ORDER — SODIUM CHLORIDE 0.9 % IV SOLN
INTRAVENOUS | Status: DC
  Administered 2021-05-18: 10 mL/h via INTRAVENOUS

## 2021-05-17 MED ORDER — HEPARIN (PORCINE) 25000 UT/250ML-% IV SOLN
1800.0000 [IU]/h | INTRAVENOUS | Status: DC
  Administered 2021-05-17: 1800 [IU]/h via INTRAVENOUS
  Filled 2021-05-17: qty 250

## 2021-05-17 MED ORDER — ASPIRIN 81 MG PO CHEW
81.0000 mg | CHEWABLE_TABLET | ORAL | Status: AC
  Administered 2021-05-18: 81 mg via ORAL
  Filled 2021-05-17: qty 1

## 2021-05-17 MED ORDER — ATORVASTATIN CALCIUM 10 MG PO TABS
10.0000 mg | ORAL_TABLET | Freq: Every day | ORAL | Status: DC
  Administered 2021-05-17 – 2021-06-01 (×16): 10 mg via ORAL
  Filled 2021-05-17 (×16): qty 1

## 2021-05-17 MED ORDER — SODIUM CHLORIDE 0.9% FLUSH: 3.0000 mL | INTRAVENOUS | Status: DC | PRN

## 2021-05-17 NOTE — Progress Notes (Signed)
PT Cancellation Note ? ?Patient Details ?Name: Matthew [name] ?MRN: [id number] ?DOB: [birth year] ? ? ?Cancelled Treatment:    Reason Eval/Treat Not Completed: Patient declined, no reason specified. Declines all oob activity, declines exercises. States "not today". Will re-attempt at later date.  ? ? ?Matthew [name] ?05/17/2021, 11:33 AM ?

## 2021-05-17 NOTE — Anesthesia Preprocedure Evaluation (Addendum)
Anesthesia Evaluation  ?Patient identified by MRN, date of birth, ID band ?Patient awake ? ? ? ?Reviewed: ?Allergy & Precautions, NPO status , Patient's Chart, lab work & pertinent test results, reviewed documented beta blocker date and time  ? ?History of Anesthesia Complications ?Negative for: history of anesthetic complications ? ?Airway ?Mallampati: I ? ?TM Distance: >3 FB ?Neck ROM: Full ? ? ? Dental ? ?(+) Edentulous Upper, Edentulous Lower, Lower Dentures, Upper Dentures ?  ?Pulmonary ?PE ?  ?breath sounds clear to auscultation ? ? ? ? ? ? Cardiovascular ?hypertension, Pt. on medications and Pt. on home beta blockers ?(-) angina+ CAD and +CHF (Entresto, non-ischemic)  ? ?Rhythm:Regular Rate:Normal ? ?05/14/2021 ECHO: EF 25%, severely decreased LVF, severely dilated LV, normal RVF, severe MR, mild AI, mod PI ?  ?Neuro/Psych ?Recent metabolic encephalopathy ?  ? GI/Hepatic ?negative GI ROS, Neg liver ROS,   ?Endo/Other  ?negative endocrine ROS ? Renal/GU ?Renal InsufficiencyRenal disease  ? ?  ?Musculoskeletal ? ?(+) Arthritis , Osteoarthritis,   ? Abdominal ?  ?Peds ? Hematology ?heparin   ?Anesthesia Other Findings ? ? Reproductive/Obstetrics ? ?  ? ? ? ? ? ? ? ? ? ? ? ? ? ?  ?  ? ? ? ? ? ? ? ?Anesthesia Physical ?Anesthesia Plan ? ?ASA: 4 ? ?Anesthesia Plan: MAC  ? ?Post-op Pain Management: Minimal or no pain anticipated  ? ?Induction:  ? ?PONV Risk Score and Plan: 1 and Treatment may vary due to age or medical condition and Ondansetron ? ?Airway Management Planned: Natural Airway and Nasal Cannula ? ?Additional Equipment: None ? ?Intra-op Plan:  ? ?Post-operative Plan:  ? ?Informed Consent: I have reviewed the patients History and Physical, chart, labs and discussed the procedure including the risks, benefits and alternatives for the proposed anesthesia with the patient or authorized representative who has indicated his/her understanding and acceptance.  ? ? ? ?Dental  advisory given ? ?Plan Discussed with: CRNA and Surgeon ? ?Anesthesia Plan Comments:   ? ? ? ? ? ?Anesthesia Quick Evaluation ? ?

## 2021-05-17 NOTE — Progress Notes (Signed)
? ?PROGRESS NOTE ? ? ? ?Matthew [name]  ?[id number]: Apr 10, 1940  ?DOA: 05/13/2021 ?PCP: Pcp, No ?Outpatient Specialists: ? ? ?Hospital course: ? ?Briefly patient is a 81 year old man with nonischemic cardiomyopathy and chronic HFrEF, CAD, CKD 3B, HTN, severe MR and thrombocytopenia who was seen in heart failure clinic and was supposed to have right heart cath but had shortness of breath and was found to have PE.  Patient has been treated with IV heparin.  Hospital course complicated by some hypotension, cardiology was called who noted RV strain which was probably chronic given known right heart failure.  Patient is being treated with Lasix with improvement in symptoms. ? ? ?Subjective: ? ?Patient notes he feels a little bit weaker than he has in the past couple of days.  Has been breathing okay.  But feels more tired. ? ? ?Objective: ?Vitals:  ? 05/17/21 0358 05/17/21 0438 05/17/21 0748 05/17/21 1145  ?BP:   95/70 98/74  ?Pulse:   93 93  ?Resp:   19 19  ?Temp: 97.9 ?F (36.6 ?C)  98.5 ?F (36.9 ?C) 98.4 ?F (36.9 ?C)  ?TempSrc: Oral  Oral Oral  ?SpO2:      ?Weight:  90.2 kg    ?Height:      ? ? ?Intake/Output Summary (Last 24 hours) at 05/17/2021 1635 ?Last data filed at 05/17/2021 0700 ?Gross per 24 hour  ?Intake 240 ml  ?Output 5400 ml  ?Net -5160 ml  ? ? ?Filed Weights  ? 05/14/21 0605 05/17/21 0438  ?Weight: 99.9 kg 90.2 kg  ? ? ? ?Exam: ? ?General: Patient appears tired, more listless than he has been but no respiratory distress ?Eyes: sclera anicteric, conjuctiva mild injection bilaterally ?CVS: S1-S2, regular  ?Respiratory:  decreased air entry bilaterally secondary to decreased inspiratory effort, rales at bases  ?GI: NABS, soft, NT  ?LE: Trace edema.  ?Neuro: A/O x 3, Moving all extremities equally with normal strength, CN 3-12 intact, grossly nonfocal.  ?Psych: patient is logical and coherent, judgement and insight appear normal, mood and affect appropriate to situation. ? ? ?Assessment & Plan: ?   ?Acute pulmonary embolus ?Segmental and subsegmental PE seen with distended right ventricle and possible right heart strain.  Patient seen by cardiology who feel the right heart strain is more likely to be chronic than acute.  No hypoxia or hypotension. ?Continue Eliquis ? ?Acute on chronic biventricular heart failure severe MR ?Very much appreciate ongoing cardiology consultation ?Patient has responded well to Lasix, feels much improved ?Spironolactone and midodrine also added per cardiology ?Plan for RHC/LHC and TEE as part of [facility] work-up, Eliquis will need to be changed to heparin per their schedule. ? ?Frequent PVCs ?Not uncommon in people with heart failure ?Asymptomatic ?Potassium and magnesium are supplemented and WNL ?Amiodarone added per cardiology ? ?CKD 3B ?Stable ? ?PVD/CAD ?Patient has wound on lower extremity, cardiology recommends lower extremity arterial studies evaluate for PAD.  Will defer this to outpatient given acute PE and ongoing anticoagulation at present. ?Statin restarted ? ?AAA ?Follow-up as an outpatient ? ?DVT prophylaxis: Eliquis ?Code Status: Full ?Family Communication: Patient's daughter Mechele Claude was at bedside throughout ?Disposition Plan:  ? Patient is from: Home ? Anticipated Discharge Location: Home ? Barriers to Discharge: Ongoing diuresis and anticoagulation ? Is patient medically stable for Discharge: Not yet ? ? ?Scheduled Meds: ? amiodarone  200 mg Oral BID  ? atorvastatin  10 mg Oral Daily  ? Chlorhexidine Gluconate Cloth  6 each Topical Daily  ?  leptospermum manuka honey  1 application. Topical Daily  ? magnesium oxide  400 mg Oral Daily  ? midodrine  5 mg Oral TID WC  ? sodium chloride flush  3 mL Intravenous Q12H  ? spironolactone  12.5 mg Oral Daily  ? ?Continuous Infusions: ? heparin    ? ? ?Data Reviewed: ? ?Basic Metabolic Panel: ?Recent Labs  ?Lab 05/14/21 ?1626 05/15/21 ?0326 05/15/21 ?1251 05/16/21 ?0145 05/17/21 ?0416  ?NA 134* 136 136 135 136  ?K 4.5 4.4 4.5  4.6 4.8  ?CL 103 101 102 102 97*  ?CO2 22 26 27 28 31   ?GLUCOSE 111* 93 106* 88 88  ?BUN 21 18 19 18 19   ?CREATININE 1.47* 1.39* 1.49* 1.43* 1.43*  ?CALCIUM 8.7* 8.7* 8.8* 8.6* 9.1  ?MG 1.8  --  1.8 2.0 2.0  ? ? ? ?CBC: ?Recent Labs  ?Lab 05/13/21 ?1223 05/13/21 ?2142 05/15/21 ?0326 05/16/21 ?0145 05/17/21 ?0416  ?WBC 4.3 4.6 4.8 5.0 5.2  ?HGB 12.9* 12.6* 11.8* 11.7* 13.2  ?HCT 41.1 38.9* 36.8* 35.8* 41.5  ?MCV 80.3 79.6* 79.3* 78.5* 78.9*  ?PLT 104* 118* 110* 148* 159  ? ? ? ?Studies: ?[place] EKG SITE RITE ? ?Result Date: 05/16/2021 ?If [employer] not attached, placement could not be confirmed due to current cardiac rhythm.  ? ?Principal Problem: ?  Acute pulmonary embolism ([place]) ?Active Problems: ?  Acute on chronic congestive heart failure ([place]) ?  Ascending aortic aneurysm ([place]) ?  Acute on chronic combined systolic and diastolic CHF (congestive heart failure) ([place]) ?  Frequent PVCs ? ? ? ? ?Dewaine Oats Derek Jack, ?[facility] ? ?If 7PM-7AM, please contact night-coverage ?[email or web address] ? ? LOS: 3 days  ? ?

## 2021-05-17 NOTE — Evaluation (Signed)
Occupational Therapy Evaluation ?Patient Details ?Name: Matthew [name] ?MRN: [id number] ?DOB: [birth year] ?Today's Date: 05/17/2021 ? ? ?History of Present Illness 81 y.o. male adm 3/31 with SOB with RLL PE treated with Heparin> Eliquis. Admission 3/25-28/2023 with AMS and UTI. PMHx: CAD, NICM, CHF, HTN, OA, obesity  ? ?Clinical Impression ?  ?Pt apparently lives with family. States he is able to care for himself and ambulates with a rollator without difficulty. Very minimal participation with OT this date. States he is having surgery tomorrow and declines any attempts at further mobility. Educated pt on the importance of mobility to facilitate a safe DC home rather than a SNF. Pt continued to decline to participate further. Will follow and attempt to progress with goal to DC home with [facility] and family support.   ?   ? ?Recommendations for follow up therapy are one component of a multi-disciplinary discharge planning process, led by the attending physician.  Recommendations may be updated based on patient status, additional functional criteria and insurance authorization.  ? ?Follow Up Recommendations ? Home health OT (pending progress)  ?  ?Assistance Recommended at Discharge Frequent or constant Supervision/Assistance  ?Patient can return home with the following A little help with walking and/or transfers;A little help with bathing/dressing/bathroom;Assistance with cooking/housework;Direct supervision/assist for financial management;Direct supervision/assist for medications management;Assist for transportation ? ?  ?Functional Status Assessment ? Patient has had a recent decline in their functional status and demonstrates the ability to make significant improvements in function in a reasonable and predictable amount of time.  ?Equipment Recommendations ? None recommended by OT  ?  ?Recommendations for Other Services   ? ? ?  ?Precautions / Restrictions Precautions ?Precautions: Fall ?Restrictions ?Weight Bearing  Restrictions: No  ? ?  ? ?Mobility Bed Mobility ?  ?  ?  ?  ?  ?  ?  ?General bed mobility comments: rolling side to side with S; states he is able to get OOB himself ?  ? ?Transfers ?  ?  ?  ?  ?  ?  ?  ?  ?  ?General transfer comment: declined ?  ? ?  ?Balance   ?  ?  ?  ?  ?  ?  ?  ?  ?  ?  ?  ?  ?  ?  ?  ?  ?  ?  ?   ? ?ADL either performed or assessed with clinical judgement  ? ?ADL Overall ADL's : Needs assistance/impaired ?Eating/Feeding: Set up ?Eating/Feeding Details (indicate cue type and reason): observed to drink from cup with setup provided ?Grooming: Set up ?  ?Upper Body Bathing: Set up;Min guard;Sitting ?  ?  ?  ?  ?  ?  ?  ?  ?  ?  ?  ?  ?  ?Functional mobility during ADLs:  (will assess) ?General ADL Comments: Will further assess ability to complete ADL tasks; pt states he is having surgery "tomorrow".  ? ? ? ?Vision Baseline Vision/History: 1 Wears glasses ?   ?   ?Perception   ?  ?Praxis   ?  ? ?Pertinent Vitals/Pain Pain Assessment ?Pain Assessment: No/denies pain  ? ? ? ?Hand Dominance Right ?  ?Extremity/Trunk Assessment Upper Extremity Assessment ?Upper Extremity Assessment: Generalized weakness ?  ?Lower Extremity Assessment ?Lower Extremity Assessment: Defer to PT evaluation ?  ?Cervical / Trunk Assessment ?Cervical / Trunk Assessment: Kyphotic ?  ?Communication Communication ?Communication: HOH ?  ?Cognition Arousal/Alertness: Awake/alert ?Behavior During Therapy: Flat affect ?Overall  Cognitive Status: No family/caregiver present to determine baseline cognitive functioning ?  ?  ?  ?  ?  ?  ?  ?  ?  ?  ?  ?  ?  ?  ?  ?  ?General Comments: pt is alert and oriented however poor insight into awareness of needing to mobilize ?  ?  ?General Comments  friend visiting ? ?  ?Exercises   ?  ?Shoulder Instructions    ? ? ?Home Living Family/patient expects to be discharged to:: Private residence ?Living Arrangements: Children;Other (Comment) ?Available Help at Discharge: Family;Available 24  hours/day ?Type of Home: House ?Home Access: Ramped entrance ?  ?  ?Home Layout: One level ?  ?  ?Bathroom Shower/Tub: Walk-in shower ?  ?Bathroom Toilet: Standard ?Bathroom Accessibility: Yes ?How Accessible: Accessible via [place] ?Home Equipment: Rollator (4 wheels);BSC/3in1;Shower seat ?  ?  ?  ? ?  ?Prior Functioning/Environment Prior Level of Function : Needs assist ?  ?  ?  ?Physical Assist : Mobility (physical);ADLs (physical) ?  ?  ?Mobility Comments: walks with rollator in home ?ADLs Comments: pt reports mod I for bathing and dressing, family performing cooking/cleaning ?  ? ?  ?  ?OT Problem List: Decreased strength;Decreased activity tolerance;Impaired balance (sitting and/or standing);Decreased cognition;Decreased safety awareness;Decreased knowledge of use of DME or AE;Decreased knowledge of precautions ?  ?   ?OT Treatment/Interventions: Self-care/ADL training;Therapeutic exercise;Energy conservation;Therapeutic activities;Cognitive remediation/compensation;Patient/family education;Balance training;DME and/or AE instruction  ?  ?OT Goals(Current goals can be found in the care plan section) Acute Rehab OT Goals ?Patient Stated Goal: to not get out of bed ?OT Goal Formulation: Patient unable to participate in goal setting ?Time For Goal Achievement: 06/06/21 ?Potential to Achieve Goals: Fair  ?OT Frequency: Min 2X/week ?  ? ?Co-evaluation   ?  ?  ?  ?  ? ?  ?AM-PAC OT "6 Clicks" Daily Activity     ?Outcome Measure Help from another person eating meals?: A Little ?Help from another person taking care of personal grooming?: A Little ?Help from another person toileting, which includes using toliet, bedpan, or urinal?: A Lot ?Help from another person bathing (including washing, rinsing, drying)?: A Lot ?Help from another person to put on and taking off regular upper body clothing?: A Little ?Help from another person to put on and taking off regular lower body clothing?: A Lot ?6 Click Score: 15 ?  ?End of  Session Nurse Communication: Mobility status ? ?Activity Tolerance: Other (comment) (poor participation) ?Patient left: in bed;with call bell/phone within reach;with bed alarm set;with family/visitor present ? ?OT Visit Diagnosis: Unsteadiness on feet (R26.81);Muscle weakness (generalized) (M62.81);Other symptoms and signs involving cognitive function  ?              ?Time: [id number] ?OT Time Calculation (min): 16 min ?Charges:  OT General Charges ?$OT Visit: 1 Visit ?OT Evaluation ?$OT Eval Moderate Complexity: 1 Mod ? ?[facility], OT/L  ? ?Acute OT Clinical Specialist ?Acute Rehabilitation Services ?Pager [phone] ?Office [phone]  ? ?Naasir Carreira,HILLARY ?05/17/2021, 3:09 PM ?

## 2021-05-17 NOTE — Progress Notes (Addendum)
? ? Advanced Heart Failure Rounding Note ? ?PCP-Cardiologist: Jayadeep Varanasi, MD  ? ?Subjective:   ? ?PICC placed yesterday. Initial co-ox 70%.  ? ?Brisk diuresis yesterday w/ nearly 8L in UOP. Net negative 6.7 L. Daily wts not followed. CVP low, 1  ? ?SCr stable w/ diuresis, uncharged today at 1.43 ?K 4.8  ?Mg 2.0  ? ?BP remains soft, on midodrine 5 tid. NSR w/ frequent PVCs on tele, ~10/min  ? ?Feels ok today. Denies resting dyspnea. No chest pain. Appetite is good. Ate most of his breakfast.  ? ? ?Objective:   ?Weight Range: ?90.2 kg ?Body mass index is 22.41 kg/m?.  ? ?Vital Signs:   ?Temp:  [97.9 ?F (36.6 ?C)-99.2 ?F (37.3 ?C)] 98.5 ?F (36.9 ?C) (04/04 0748) ?Pulse Rate:  [93-98] 93 (04/04 0748) ?Resp:  [18-25] 19 (04/04 0748) ?BP: (91-107)/(70-81) 95/70 (04/04 0748) ?SpO2:  [97 %-100 %] 97 % (04/04 0042) ?Weight:  [90.2 kg] 90.2 kg (04/04 0438) ?Last BM Date : 05/15/21 ? ?Weight change: ?Filed Weights  ? 05/14/21 0605 05/17/21 0438  ?Weight: 99.9 kg 90.2 kg  ? ? ?Intake/Output:  ? ?Intake/Output Summary (Last 24 hours) at 05/17/2021 0918 ?Last data filed at 05/17/2021 0700 ?Gross per 24 hour  ?Intake 720 ml  ?Output 7700 ml  ?Net -6980 ml  ?  ? ? ?Physical Exam  ?  ?CVP 1  ?General:  elderly male, sitting up in bed. No resp difficulty ?HEENT: Normal ?Neck: Supple. JVP . Carotids 2+ bilat; no bruits. No lymphadenopathy or thyromegaly appreciated. ?Cor: PMI nondisplaced. Regular rate & rhythm. 2/3 MR and TR murmurs  ?Lungs: Clear ?Abdomen: Soft, nontender, nondistended. No hepatosplenomegaly. No bruits or masses. Good bowel sounds. ?Extremities: No cyanosis, clubbing, rash, edema + RUE PICC, Rt lower anterior tibia bandaged (venous stasis ulcer)  ?Neuro: Alert & orientedx3, cranial nerves grossly intact. moves all 4 extremities w/o difficulty. Affect pleasant ? ? ?Telemetry  ? ?NSR 80s w/ frequent PVCs, ~10/min  ? ?EKG  ?  ?No new EKG to review  ? ?Labs  ?  ?CBC ?Recent Labs  ?  05/16/21 ?0145 05/17/21 ?0416   ?WBC 5.0 5.2  ?HGB 11.7* 13.2  ?HCT 35.8* 41.5  ?MCV 78.5* 78.9*  ?PLT 148* 159  ? ?Basic Metabolic Panel ?Recent Labs  ?  05/16/21 ?0145 05/17/21 ?0416  ?NA 135 136  ?K 4.6 4.8  ?CL 102 97*  ?CO2 28 31  ?GLUCOSE 88 88  ?BUN 18 19  ?CREATININE 1.43* 1.43*  ?CALCIUM 8.6* 9.1  ?MG 2.0 2.0  ? ?Liver Function Tests ?No results for input(s): AST, ALT, ALKPHOS, BILITOT, PROT, ALBUMIN in the last 72 hours. ?No results for input(s): LIPASE, AMYLASE in the last 72 hours. ?Cardiac Enzymes ?No results for input(s): CKTOTAL, CKMB, CKMBINDEX, TROPONINI in the last 72 hours. ? ?BNP: ?BNP (last 3 results) ?Recent Labs  ?  05/09/21 ?0535 05/10/21 ?0205 05/13/21 ?2142  ?BNP >4,500.0* >4,500.0* >4,500.0*  ? ? ?ProBNP (last 3 results) ?No results for input(s): PROBNP in the last 8760 hours. ? ? ?D-Dimer ?No results for input(s): DDIMER in the last 72 hours. ?Hemoglobin A1C ?No results for input(s): HGBA1C in the last 72 hours. ?Fasting Lipid Panel ?No results for input(s): CHOL, HDL, LDLCALC, TRIG, CHOLHDL, LDLDIRECT in the last 72 hours. ?Thyroid Function Tests ?No results for input(s): TSH, T4TOTAL, T3FREE, THYROIDAB in the last 72 hours. ? ?Invalid input(s): FREET3 ? ?Other results: ? ? ?Imaging  ? ? ?US EKG SITE RITE ? ?Result Date:   05/16/2021 ?If Site Rite image not attached, placement could not be confirmed due to current cardiac rhythm.  ? ? ?Medications:   ? ? ?Scheduled Medications: ? amiodarone  200 mg Oral BID  ? apixaban  10 mg Oral BID  ? Followed by  ? [START ON 05/22/2021] apixaban  5 mg Oral BID  ? Chlorhexidine Gluconate Cloth  6 each Topical Daily  ? furosemide  80 mg Intravenous BID  ? leptospermum manuka honey  1 application. Topical Daily  ? magnesium oxide  400 mg Oral Daily  ? midodrine  5 mg Oral TID WC  ? spironolactone  12.5 mg Oral Daily  ? ? ?Infusions: ? ? ?PRN Medications: ?acetaminophen **OR** acetaminophen, sodium chloride flush ? ? ? ?Patient Profile  ? ?80 y.o. male with history of chronic biventricular  HF, NICM, CAD, severe MR/TR, HTN, obesity and multiple recent admissions, now admitted w/ acute PE and a/c CHF.  ? ?Echo this admission, EF 25%, mildly dilated RV with moderately decreased systolic function, severe MR and TR. IVC dilated.  ? ?Venous dopplers negative for DVT  ? ?Assessment/Plan  ? ?1. Acute on chronic Biventricular HF   ?- ECHO 04/2020 EF 25-30% with mild MR.   ?- Echo 04/04/21 EF LVEF 20-25%, RV moderately reduced, LA/RA severely dilated, and severe MR.  ?- Echo per Dr. McLean read: EF 25%, LV severely dilated, heavy trabeculations with no LV thrombus, RV mildly dilated, RV moderately reduced, severe BAE, severe MR, severe TR, dilated IVC with estimated RAP 15 mmHg ?- Most recent cath 2012.  ?- Will eventually need R/LHC but need to treat PE first ?- ? How much PVCs contributing, frequent ectopy on telemetry (see below) ?- BNP > 4,500. Lactic acid 1.6 on 04/01. Co-ox ok at 70%  ?- Brisk diuresis w/ IV Lasix. CVP 1. Hold diuretics today  ?- GDMT limited d/t hypotension. Continue midodrine 5 mg TID ?- Continue Spiro 12.5 mg daily  ?  ?2. CAD ?- Cath 2012- severe stenosis of the diagonal ?- No chest pain.  ?- Eventual R/LHC as above ?- HS troponin 14>12 ?- Restart statin ?  ?3. Mitral Regurgitation ?- Severe on echo this admit and last echo 02/23 ?- functional, severely dilated LA and LV  ?- HF optimization per above  ?-Eventual TEE to see if potential candidate for MV clip ?  ?4. Severe TR ?- functional, severely dilated RA and RV   ?- HF optimization per above  ?  ?5. Acute PE ?- Continue Eliquis 10 mg BID X 1 week, then 5 mg BID ?- Will need to transition to heparin gtt if decide on procedures this admit ?- No evidence of DVT on US ?- ? 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  ?6. Hyperlipidemia ?- Resume statin ?  ?7. CKD IIIa ?- Scr 1.43, baseline around 1.4-1.7 last few months ?- Stable w/ diuresis ?- follow BMP  ?  ?8. NSVT/PVCs ?- 21 beat run NSVT on  04/02 + frequent PVCs  ?- Amio 200 mg BID started  ?- improving, less ectopy on tele today  ?- Keep K > 4 and Mag > 2 ?  ?9. Right LE wounds ?- occurred after bumping leg on furniture a few weeks ago ?- ABIs overestimated d/t noncompressible vessels ?- WOC has seen, recs provided  ? ? ?Length of Stay: 3 ? ?Brittainy Simmons, PA-C  ?05/17/2021, 9:18 AM ? ?Advanced Heart Failure Team ?Pager [phone] (M-F; 7a -   5p)  ?Please contact [facility] for night-coverage after hours (5p -7a ) and weekends on [email or web address] ? ?Patient seen and examined with the above-signed Advanced Practice Provider and/or Housestaff. I personally reviewed laboratory data, imaging studies and relevant notes. I independently examined the patient and formulated the important aspects of the plan. I have edited the note to reflect any of my changes or salient points. I have personally discussed the plan with the patient and/or family. ? ? ?Has diuresed well. Denies SOB, orthopnea or PND. CVP 1-2. Co-ox 76% SCr stable at 1.4 ? ?General:  Elderly weak appearing. No resp difficulty ?HEENT: normal ?Neck: supple. no JVD. Carotids 2+ bilat; no bruits. No lymphadenopathy or thryomegaly appreciated. ?Cor: PMI nondisplaced. Regular rate & rhythm. 2/6 MR ?Lungs: clear ?Abdomen: soft, nontender, nondistended. No hepatosplenomegaly. No bruits or masses. Good bowel sounds. ?Extremities: no cyanosis, clubbing, rash, edema wound on RLE ?Neuro: alert & orientedx3, cranial nerves grossly intact. moves all 4 extremities w/o difficulty. Affect pleasant ? ?He has diuresed well. Will switch Eliquis to heparin. Plan TEE & R/L [facility] tomorrow to see if he would be candidate for MitraClip.Will need aggressive PT.  ? ?Skip Litke, MD  ?10:30 AM ? ? ? ? ? ? ? ?

## 2021-05-17 NOTE — H&P (View-Only) (Signed)
? ? Advanced Heart Failure Rounding Note ? ?PCP-Cardiologist: Larae Grooms, MD  ? ?Subjective:   ? ?PICC placed yesterday. Initial co-ox 70%.  ? ?Brisk diuresis yesterday w/ nearly 8L in UOP. Net negative 6.7 L. Daily wts not followed. CVP low, 1  ? ?SCr stable w/ diuresis, uncharged today at 1.43 ?K 4.8  ?Mg 2.0  ? ?BP remains soft, on midodrine 5 tid. NSR w/ frequent PVCs on tele, ~10/min  ? ?Feels ok today. Denies resting dyspnea. No chest pain. Appetite is good. Ate most of his breakfast.  ? ? ?Objective:   ?Weight Range: ?90.2 kg ?Body mass index is 22.41 kg/m?.  ? ?Vital Signs:   ?Temp:  [97.9 ?F (36.6 ?C)-99.2 ?F (37.3 ?C)] 98.5 ?F (36.9 ?C) (04/04 [id number]) ?Pulse Rate:  [93-98] 93 (04/04 0748) ?Resp:  [18-25] 19 (04/04 0748) ?BP: (91-107)/(70-81) 95/70 (04/04 0748) ?SpO2:  [97 %-100 %] 97 % (04/04 0042) ?Weight:  [90.2 kg] 90.2 kg (04/04 0438) ?Last BM Date : 05/15/21 ? ?Weight change: ?Filed Weights  ? 05/14/21 0605 05/17/21 0438  ?Weight: 99.9 kg 90.2 kg  ? ? ?Intake/Output:  ? ?Intake/Output Summary (Last 24 hours) at 05/17/2021 0918 ?Last data filed at 05/17/2021 0700 ?Gross per 24 hour  ?Intake 720 ml  ?Output 7700 ml  ?Net -6980 ml  ?  ? ? ?Physical Exam  ?  ?CVP 1  ?General:  elderly male, sitting up in bed. No resp difficulty ?HEENT: Normal ?Neck: Supple. JVP . Carotids 2+ bilat; no bruits. No lymphadenopathy or thyromegaly appreciated. ?Cor: PMI nondisplaced. Regular rate & rhythm. 2/3 MR and TR murmurs  ?Lungs: Clear ?Abdomen: Soft, nontender, nondistended. No hepatosplenomegaly. No bruits or masses. Good bowel sounds. ?Extremities: No cyanosis, clubbing, rash, edema + RUE PICC, Rt lower anterior tibia bandaged (venous stasis ulcer)  ?Neuro: Alert & orientedx3, cranial nerves grossly intact. moves all 4 extremities w/o difficulty. Affect pleasant ? ? ?Telemetry  ? ?NSR 80s w/ frequent PVCs, ~10/min  ? ?EKG  ?  ?No new EKG to review  ? ?Labs  ?  ?CBC ?Recent Labs  ?  05/16/21 ?0145 05/17/21 ?0416   ?WBC 5.0 5.2  ?HGB 11.7* 13.2  ?HCT 35.8* 41.5  ?MCV 78.5* 78.9*  ?PLT 148* 159  ? ?Basic Metabolic Panel ?Recent Labs  ?  05/16/21 ?0145 05/17/21 ?0416  ?NA 135 136  ?K 4.6 4.8  ?CL 102 97*  ?CO2 28 31  ?GLUCOSE 88 88  ?BUN 18 19  ?CREATININE 1.43* 1.43*  ?CALCIUM 8.6* 9.1  ?MG 2.0 2.0  ? ?Liver Function Tests ?No results for input(s): AST, ALT, ALKPHOS, BILITOT, PROT, ALBUMIN in the last 72 hours. ?No results for input(s): LIPASE, AMYLASE in the last 72 hours. ?Cardiac Enzymes ?No results for input(s): CKTOTAL, CKMB, CKMBINDEX, TROPONINI in the last 72 hours. ? ?BNP: ?BNP (last 3 results) ?Recent Labs  ?  05/09/21 ?[id number] 05/10/21 ?0205 05/13/21 ?2142  ?BNP >4,500.0* >4,500.0* >4,500.0*  ? ? ?ProBNP (last 3 results) ?No results for input(s): PROBNP in the last 8760 hours. ? ? ?D-Dimer ?No results for input(s): DDIMER in the last 72 hours. ?Hemoglobin A1C ?No results for input(s): HGBA1C in the last 72 hours. ?Fasting Lipid Panel ?No results for input(s): CHOL, HDL, LDLCALC, TRIG, CHOLHDL, LDLDIRECT in the last 72 hours. ?Thyroid Function Tests ?No results for input(s): TSH, T4TOTAL, T3FREE, THYROIDAB in the last 72 hours. ? ?Invalid input(s): FREET3 ? ?Other results: ? ? ?Imaging  ? ? ?[place] EKG SITE RITE ? ?Result Date:  05/16/2021 ?If [employer] not attached, placement could not be confirmed due to current cardiac rhythm.  ? ? ?Medications:   ? ? ?Scheduled Medications: ? amiodarone  200 mg Oral BID  ? apixaban  10 mg Oral BID  ? Followed by  ? [START ON 05/22/2021] apixaban  5 mg Oral BID  ? Chlorhexidine Gluconate Cloth  6 each Topical Daily  ? furosemide  80 mg Intravenous BID  ? leptospermum manuka honey  1 application. Topical Daily  ? magnesium oxide  400 mg Oral Daily  ? midodrine  5 mg Oral TID WC  ? spironolactone  12.5 mg Oral Daily  ? ? ?Infusions: ? ? ?PRN Medications: ?acetaminophen **OR** acetaminophen, sodium chloride flush ? ? ? ?Patient Profile  ? ?81 y.o. male with history of chronic biventricular  HF, NICM, CAD, severe MR/TR, HTN, obesity and multiple recent admissions, now admitted w/ acute PE and a/c CHF.  ? ?Echo this admission, EF 25%, mildly dilated RV with moderately decreased systolic function, severe MR and TR. IVC dilated.  ? ?Venous dopplers negative for DVT  ? ?Assessment/Plan  ? ?1. Acute on chronic Biventricular HF   ?- ECHO 04/2020 EF 25-30% with mild MR.   ?- Echo 04/04/21 EF LVEF 20-25%, RV moderately reduced, LA/RA severely dilated, and severe MR.  ?- Echo per Dr. Aundra Dubin read: EF 25%, LV severely dilated, heavy trabeculations with no LV thrombus, RV mildly dilated, RV moderately reduced, severe BAE, severe MR, severe TR, dilated IVC with estimated RAP 15 mmHg ?- Most recent cath 2012.  ?- Will eventually need R/LHC but need to treat PE first ?- ? How much PVCs contributing, frequent ectopy on telemetry (see below) ?- BNP > 4,500. Lactic acid 1.6 on 04/01. Co-ox ok at 70%  ?- Brisk diuresis w/ IV Lasix. CVP 1. Hold diuretics today  ?- GDMT limited d/t hypotension. Continue midodrine 5 mg TID ?- Continue Spiro 12.5 mg daily  ?  ?2. CAD ?- Cath 2012- severe stenosis of the diagonal ?- No chest pain.  ?- Eventual R/LHC as above ?- HS troponin 14>12 ?- Restart statin ?  ?3. Mitral Regurgitation ?- Severe on echo this admit and last echo 02/23 ?- functional, severely dilated LA and LV  ?- HF optimization per above  ?-Eventual TEE to see if potential candidate for MV clip ?  ?4. Severe TR ?- functional, severely dilated RA and RV   ?- HF optimization per above  ?  ?5. Acute PE ?- Continue Eliquis 10 mg BID X 1 week, then 5 mg BID ?- Will need to transition to heparin gtt if decide on procedures this admit ?- No evidence of DVT on [place] ?- ? 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  ?6. Hyperlipidemia ?- Resume statin ?  ?7. CKD IIIa ?- Scr 1.43, baseline around 1.4-1.7 last few months ?- Stable w/ diuresis ?- follow BMP  ?  ?8. NSVT/PVCs ?- 21 beat run NSVT on  04/02 + frequent PVCs  ?- Amio 200 mg BID started  ?- improving, less ectopy on tele today  ?- Keep K > 4 and Mag > 2 ?  ?9. Right LE wounds ?- occurred after bumping leg on furniture a few weeks ago ?- ABIs overestimated d/t noncompressible vessels ?- WOC has seen, recs provided  ? ? ?Length of Stay: 3 ? ?Lyda Jester, PA-C  ?05/17/2021, 9:18 AM ? ?Advanced Heart Failure Team ?Pager [phone] (M-F; 7a -  5p)  ?Please contact [facility] for night-coverage after hours (5p -7a ) and weekends on [email or web address] ? ?Patient seen and examined with the above-signed Advanced Practice Provider and/or Housestaff. I personally reviewed laboratory data, imaging studies and relevant notes. I independently examined the patient and formulated the important aspects of the plan. I have edited the note to reflect any of my changes or salient points. I have personally discussed the plan with the patient and/or family. ? ? ?Has diuresed well. Denies SOB, orthopnea or PND. CVP 1-2. Co-ox 76% SCr stable at 1.4 ? ?General:  Elderly weak appearing. No resp difficulty ?HEENT: normal ?Neck: supple. no JVD. Carotids 2+ bilat; no bruits. No lymphadenopathy or thryomegaly appreciated. ?Cor: PMI nondisplaced. Regular rate & rhythm. 2/6 MR ?Lungs: clear ?Abdomen: soft, nontender, nondistended. No hepatosplenomegaly. No bruits or masses. Good bowel sounds. ?Extremities: no cyanosis, clubbing, rash, edema wound on RLE ?Neuro: alert & orientedx3, cranial nerves grossly intact. moves all 4 extremities w/o difficulty. Affect pleasant ? ?He has diuresed well. Will switch Eliquis to heparin. Plan TEE & R/L [facility] tomorrow to see if he would be candidate for MitraClip.Will need aggressive PT.  ? ?Glori Bickers, MD  ?10:30 AM ? ? ? ? ? ? ? ?

## 2021-05-17 NOTE — Progress Notes (Signed)
ANTICOAGULATION CONSULT NOTE ? ?Pharmacy Consult for heparin ?Indication: pulmonary embolus ? ?Allergies  ?Allergen Reactions  ? Lactose Intolerance (Gi) Other (See Comments)  ?  unknown  ? ? ?Patient Measurements: ?Height: 6\' 7"  (200.7 cm) ?Weight: 90.2 kg (198 lb 14.4 oz) ?IBW/kg (Calculated) : 93.7 ?Heparin Dosing Weight: 90kg ? ?Vital Signs: ?Temp: 98.4 ?F (36.9 ?C) (04/04 1145) ?Temp Source: Oral (04/04 1145) ?BP: 98/74 (04/04 1145) ?Pulse Rate: 93 (04/04 1145) ? ?Labs: ?Recent Labs  ?  05/14/21 ?1418 05/14/21 ?1626 05/14/21 ?2141 05/15/21 ?0326 05/15/21 ?1251 05/16/21 ?0145 05/17/21 ?0416  ?HGB  --    < >  --  11.8*  --  11.7* 13.2  ?HCT  --   --   --  36.8*  --  35.8* 41.5  ?PLT  --   --   --  110*  --  148* 159  ?HEPARINUNFRC 0.51  --  0.54 0.54  --   --   --   ?CREATININE  --    < >  --  1.39* 1.49* 1.43* 1.43*  ? < > = values in this interval not displayed.  ? ? ?Estimated Creatinine Clearance: 52.6 mL/min (A) (by C-G formula based on SCr of 1.43 mg/dL (H)). ? ? ?Medical History: ?Past Medical History:  ?Diagnosis Date  ? CAD (coronary artery disease)   ? Chronic HFrEF (heart failure with reduced ejection fraction) ([place])   ? Chronic kidney disease, stage 3b ([place])   ? Encephalopathy 04/2021  ? admitted for metabolic encephalopathy  ? HTN (hypertension)   ? Hyperlipidemia   ? NICM (nonischemic cardiomyopathy) ([place])   ? Obesity   ? Osteoarthritis   ? Severe mitral regurgitation   ? Thoracic aortic aneurysm (TAA) ([place])   ? Thrombocytopenia ([place])   ? noted in labs in 2023  ? Tricuspid regurgitation   ? ? ?Assessment: ?63 yoM admitted with new PE. No anticoagulation PTA. Pt started on heparin infusion and ultimately apixaban, but now needs further cardiac workup so will transition back to heparin. Pt was therapeutic previously on 1800 units/h so will start here. Last apixaban dose was 0900. ? ?Goal of Therapy:  ?Heparin level 0.3-0.7 units/ml ?aPTT 66-102 seconds ?Monitor platelets by anticoagulation protocol:  Yes ?  ?Plan:  ?Resume heparin 1800 units/h no bolus at 2100 ?Check aPTT and heparin level 8h after resuming ? ?Arrie Senate, PharmD, BCPS, BCCP ?Clinical Pharmacist ?[phone] ?Please check AMION for all [place] numbers ?05/17/2021 ? ? ? ?

## 2021-05-17 NOTE — Progress Notes (Signed)
?  05/17/21 1615  ?Clinical Encounter Type  ?Visited With Patient and family together ?[name], RN)  ?Visit Type Initial ?(Advance Directive Consultation)  ?Referral From Nurse ?[name], RN)  ?Consult/Referral To Chaplain  ? ?Spiritual Care Consultation for Advance Directive placed by Lenora D. Alroy Dust, Therapist, sports. ?Later entry by Loistine Chance. [place] on 05/15/2021 at 1059 hrs. indicates patient is not interested in completing Advance Directive.  ? ?Chaplain met with Mr. Matthew [name] and his daughter at patients bedside. Matthew [name] stated that he has not given any thought to who he would want to be his [name]. Advance Directive packet was left with patient to review should he later decide that wants to consider naming agents or a living will. [address] [place], [place] [phone]. ?

## 2021-05-18 ENCOUNTER — Encounter (HOSPITAL_COMMUNITY)
Admission: EM | Disposition: A | Discharge status: Rehabilitation Facility | Payer: Self-pay | Source: Home / Self Care | Attending: Internal Medicine

## 2021-05-18 ENCOUNTER — Encounter (HOSPITAL_COMMUNITY): Payer: Self-pay | Admitting: Internal Medicine

## 2021-05-18 ENCOUNTER — Inpatient Hospital Stay (HOSPITAL_COMMUNITY)
Admission: EM | Disposition: A | Discharge status: Rehabilitation Facility | Payer: Self-pay | Source: Home / Self Care | Attending: Internal Medicine

## 2021-05-18 ENCOUNTER — Inpatient Hospital Stay (HOSPITAL_COMMUNITY): Payer: Medicare Other | Admitting: Anesthesiology

## 2021-05-18 ENCOUNTER — Inpatient Hospital Stay (HOSPITAL_COMMUNITY): Payer: Medicare Other

## 2021-05-18 DIAGNOSIS — I361 Nonrheumatic tricuspid (valve) insufficiency: Secondary | ICD-10-CM

## 2021-05-18 DIAGNOSIS — I5023 Acute on chronic systolic (congestive) heart failure: Secondary | ICD-10-CM

## 2021-05-18 DIAGNOSIS — N1831 Chronic kidney disease, stage 3a: Secondary | ICD-10-CM | POA: Diagnosis not present

## 2021-05-18 DIAGNOSIS — I2510 Atherosclerotic heart disease of native coronary artery without angina pectoris: Secondary | ICD-10-CM

## 2021-05-18 DIAGNOSIS — I2699 Other pulmonary embolism without acute cor pulmonale: Secondary | ICD-10-CM | POA: Diagnosis not present

## 2021-05-18 DIAGNOSIS — I340 Nonrheumatic mitral (valve) insufficiency: Secondary | ICD-10-CM

## 2021-05-18 DIAGNOSIS — I493 Ventricular premature depolarization: Secondary | ICD-10-CM | POA: Diagnosis not present

## 2021-05-18 DIAGNOSIS — I088 Other rheumatic multiple valve diseases: Secondary | ICD-10-CM

## 2021-05-18 DIAGNOSIS — N179 Acute kidney failure, unspecified: Secondary | ICD-10-CM | POA: Diagnosis present

## 2021-05-18 DIAGNOSIS — I110 Hypertensive heart disease with heart failure: Secondary | ICD-10-CM

## 2021-05-18 DIAGNOSIS — I5043 Acute on chronic combined systolic (congestive) and diastolic (congestive) heart failure: Secondary | ICD-10-CM | POA: Diagnosis not present

## 2021-05-18 DIAGNOSIS — I4729 Other ventricular tachycardia: Secondary | ICD-10-CM

## 2021-05-18 HISTORY — PX: RIGHT/LEFT HEART CATH AND CORONARY ANGIOGRAPHY: CATH118266

## 2021-05-18 HISTORY — PX: TEE WITHOUT CARDIOVERSION: SHX5443

## 2021-05-18 LAB — POCT I-STAT EG7
Acid-Base Excess: 1 mmol/L (ref 0.0–2.0)
Acid-Base Excess: 5 mmol/L — ABNORMAL HIGH (ref 0.0–2.0)
Bicarbonate: 25.6 mmol/L (ref 20.0–28.0)
Bicarbonate: 30.7 mmol/L — ABNORMAL HIGH (ref 20.0–28.0)
Calcium, Ion: 1.18 mmol/L (ref 1.15–1.40)
Calcium, Ion: 1.2 mmol/L (ref 1.15–1.40)
HCT: 38 % — ABNORMAL LOW (ref 39.0–52.0)
HCT: 45 % (ref 39.0–52.0)
Hemoglobin: 12.9 g/dL — ABNORMAL LOW (ref 13.0–17.0)
Hemoglobin: 15.3 g/dL (ref 13.0–17.0)
O2 Saturation: 71 %
O2 Saturation: 74 %
Potassium: 4 mmol/L (ref 3.5–5.1)
Potassium: 5.1 mmol/L (ref 3.5–5.1)
Sodium: 134 mmol/L — ABNORMAL LOW (ref 135–145)
Sodium: 140 mmol/L (ref 135–145)
TCO2: 27 mmol/L (ref 22–32)
TCO2: 32 mmol/L (ref 22–32)
pCO2, Ven: 39.1 mmHg — ABNORMAL LOW (ref 44–60)
pCO2, Ven: 46.5 mmHg (ref 44–60)
pH, Ven: 7.423 (ref 7.25–7.43)
pH, Ven: 7.428 (ref 7.25–7.43)
pO2, Ven: 37 mmHg (ref 32–45)
pO2, Ven: 38 mmHg (ref 32–45)

## 2021-05-18 LAB — APTT: aPTT: 95 seconds — ABNORMAL HIGH (ref 24–36)

## 2021-05-18 LAB — COOXEMETRY PANEL
Carboxyhemoglobin: 1 % (ref 0.5–1.5)
Methemoglobin: <0.7 % (ref 0.0–1.5)
O2 Saturation: 71.8 %
Total hemoglobin: 13.7 g/dL (ref 12.0–16.0)

## 2021-05-18 LAB — POCT I-STAT 7, (LYTES, BLD GAS, ICA,H+H)
Acid-Base Excess: 1 mmol/L (ref 0.0–2.0)
Bicarbonate: 25 mmol/L (ref 20.0–28.0)
Calcium, Ion: 0.85 mmol/L — CL (ref 1.15–1.40)
HCT: 40 % (ref 39.0–52.0)
Hemoglobin: 13.6 g/dL (ref 13.0–17.0)
O2 Saturation: 96 %
Potassium: 4.2 mmol/L (ref 3.5–5.1)
Sodium: 139 mmol/L (ref 135–145)
TCO2: 26 mmol/L (ref 22–32)
pCO2 arterial: 37 mmHg (ref 32–48)
pH, Arterial: 7.439 (ref 7.35–7.45)
pO2, Arterial: 80 mmHg — ABNORMAL LOW (ref 83–108)

## 2021-05-18 LAB — CBC
HCT: 42.1 % (ref 39.0–52.0)
Hemoglobin: 13.8 g/dL (ref 13.0–17.0)
MCH: 25.6 pg — ABNORMAL LOW (ref 26.0–34.0)
MCHC: 32.8 g/dL (ref 30.0–36.0)
MCV: 78.1 fL — ABNORMAL LOW (ref 80.0–100.0)
Platelets: 166 10*3/uL (ref 150–400)
RBC: 5.39 MIL/uL (ref 4.22–5.81)
RDW: 21.2 % — ABNORMAL HIGH (ref 11.5–15.5)
WBC: 5.8 10*3/uL (ref 4.0–10.5)
nRBC: 0 % (ref 0.0–0.2)

## 2021-05-18 LAB — HEPARIN LEVEL (UNFRACTIONATED): Heparin Unfractionated: >1.1 IU/mL — ABNORMAL HIGH (ref 0.30–0.70)

## 2021-05-18 LAB — MAGNESIUM: Magnesium: 2.1 mg/dL (ref 1.7–2.4)

## 2021-05-18 SURGERY — CORONARY STENT INTERVENTION
Anesthesia: LOCAL

## 2021-05-18 SURGERY — ECHOCARDIOGRAM, TRANSESOPHAGEAL
Anesthesia: Monitor Anesthesia Care

## 2021-05-18 SURGERY — RIGHT/LEFT HEART CATH AND CORONARY ANGIOGRAPHY
Anesthesia: LOCAL

## 2021-05-18 MED ORDER — LIDOCAINE HCL (PF) 1 % IJ SOLN
INTRAMUSCULAR | Status: AC
  Filled 2021-05-18: qty 30

## 2021-05-18 MED ORDER — SODIUM CHLORIDE 0.9% FLUSH: 3.0000 mL | INTRAVENOUS | Status: DC | PRN

## 2021-05-18 MED ORDER — HEPARIN SODIUM (PORCINE) 1000 UNIT/ML IJ SOLN
INTRAMUSCULAR | Status: DC | PRN
  Administered 2021-05-18: 3500 [IU] via INTRAVENOUS

## 2021-05-18 MED ORDER — HEPARIN (PORCINE) 25000 UT/250ML-% IV SOLN
1800.0000 [IU]/h | INTRAVENOUS | Status: AC
  Administered 2021-05-18: 1800 [IU]/h via INTRAVENOUS
  Filled 2021-05-18: qty 250

## 2021-05-18 MED ORDER — SODIUM CHLORIDE 0.9% FLUSH
3.0000 mL | Freq: Two times a day (BID) | INTRAVENOUS | Status: DC
  Administered 2021-05-21 – 2021-06-01 (×16): 3 mL via INTRAVENOUS

## 2021-05-18 MED ORDER — HEPARIN (PORCINE) IN NACL 1000-0.9 UT/500ML-% IV SOLN
INTRAVENOUS | Status: DC | PRN
  Administered 2021-05-18 (×2): 500 mL

## 2021-05-18 MED ORDER — MIDAZOLAM HCL 5 MG/5ML IJ SOLN
INTRAMUSCULAR | Status: AC
  Filled 2021-05-18: qty 5

## 2021-05-18 MED ORDER — LABETALOL HCL 5 MG/ML IV SOLN: 10.0000 mg | INTRAVENOUS | Status: AC | PRN

## 2021-05-18 MED ORDER — MIDAZOLAM HCL 2 MG/2ML IJ SOLN: INTRAMUSCULAR | Status: DC | PRN

## 2021-05-18 MED ORDER — ACETAMINOPHEN 325 MG PO TABS: 650.0000 mg | ORAL_TABLET | ORAL | Status: DC | PRN

## 2021-05-18 MED ORDER — HYDRALAZINE HCL 20 MG/ML IJ SOLN: 10.0000 mg | INTRAMUSCULAR | Status: AC | PRN

## 2021-05-18 MED ORDER — FENTANYL CITRATE (PF) 100 MCG/2ML IJ SOLN
INTRAMUSCULAR | Status: DC | PRN
  Administered 2021-05-18: 25 ug via INTRAVENOUS

## 2021-05-18 MED ORDER — BUTAMBEN-TETRACAINE-BENZOCAINE 2-2-14 % EX AERO
INHALATION_SPRAY | CUTANEOUS | Status: DC | PRN
  Administered 2021-05-18: 2 via TOPICAL

## 2021-05-18 MED ORDER — PROPOFOL 500 MG/50ML IV EMUL
INTRAVENOUS | Status: DC | PRN
  Administered 2021-05-18: 25 ug/kg/min via INTRAVENOUS

## 2021-05-18 MED ORDER — PHENYLEPHRINE HCL (PRESSORS) 10 MG/ML IV SOLN
INTRAVENOUS | Status: DC | PRN
  Administered 2021-05-18: 50 ug via INTRAVENOUS
  Administered 2021-05-18: 75 ug via INTRAVENOUS

## 2021-05-18 MED ORDER — VERAPAMIL HCL 2.5 MG/ML IV SOLN
INTRAVENOUS | Status: AC
  Filled 2021-05-18: qty 2

## 2021-05-18 MED ORDER — HEPARIN SODIUM (PORCINE) 1000 UNIT/ML IJ SOLN
INTRAMUSCULAR | Status: AC
  Filled 2021-05-18: qty 10

## 2021-05-18 MED ORDER — SODIUM CHLORIDE 0.9 % IV SOLN: INTRAVENOUS | Status: AC

## 2021-05-18 MED ORDER — LIDOCAINE HCL (PF) 1 % IJ SOLN
INTRAMUSCULAR | Status: DC | PRN
  Administered 2021-05-18 (×2): 3 mL

## 2021-05-18 MED ORDER — HEPARIN (PORCINE) IN NACL 1000-0.9 UT/500ML-% IV SOLN
INTRAVENOUS | Status: AC
  Filled 2021-05-18: qty 1000

## 2021-05-18 MED ORDER — PHENYLEPHRINE HCL-NACL 20-0.9 MG/250ML-% IV SOLN
INTRAVENOUS | Status: DC | PRN
  Administered 2021-05-18: 25 ug/min via INTRAVENOUS

## 2021-05-18 MED ORDER — FENTANYL CITRATE (PF) 100 MCG/2ML IJ SOLN
INTRAMUSCULAR | Status: AC
  Filled 2021-05-18: qty 2

## 2021-05-18 MED ORDER — HEPARIN (PORCINE) 25000 UT/250ML-% IV SOLN: 1800.0000 [IU]/h | INTRAVENOUS | Status: DC

## 2021-05-18 MED ORDER — MIDAZOLAM HCL 5 MG/5ML IJ SOLN
INTRAMUSCULAR | Status: DC | PRN
  Administered 2021-05-18: 1 mg via INTRAVENOUS

## 2021-05-18 MED ORDER — ETOMIDATE 2 MG/ML IV SOLN
INTRAVENOUS | Status: DC | PRN
  Administered 2021-05-18 (×2): 2 mg via INTRAVENOUS
  Administered 2021-05-18: 4 mg via INTRAVENOUS
  Administered 2021-05-18 (×2): 2 mg via INTRAVENOUS

## 2021-05-18 MED ORDER — SODIUM CHLORIDE 0.9 % IV SOLN: 250.0000 mL | INTRAVENOUS | Status: DC | PRN

## 2021-05-18 MED ORDER — ONDANSETRON HCL 4 MG/2ML IJ SOLN: 4.0000 mg | Freq: Four times a day (QID) | INTRAMUSCULAR | Status: DC | PRN

## 2021-05-18 SURGICAL SUPPLY — 17 items
BAND CMPR LRG ZPHR (HEMOSTASIS) ×1
BAND ZEPHYR COMPRESS 30 LONG (HEMOSTASIS) ×1 IMPLANT
CATH 5FR JL3.5 JR4 ANG PIG MP (CATHETERS) ×1 IMPLANT
CATH BALLN WEDGE 5F 110CM (CATHETERS) ×1 IMPLANT
CATH INFINITI 5F JL4 125CM (CATHETERS) ×1 IMPLANT
CATH INFINITI 5FR JR4 125CM (CATHETERS) ×1 IMPLANT
GLIDESHEATH SLEND SS 6F .021 (SHEATH) ×2 IMPLANT
GLIDEWIRE ANGLED SS 035X260CM (WIRE) IMPLANT
GUIDEWIRE .025 260CM (WIRE) ×1 IMPLANT
GUIDEWIRE INQWIRE 1.5J.035X260 (WIRE) IMPLANT
INQWIRE 1.5J .035X260CM (WIRE) ×4
KIT MICROPUNCTURE NIT STIFF (SHEATH) ×1 IMPLANT
PACK CARDIAC CATHETERIZATION (CUSTOM PROCEDURE TRAY) ×2 IMPLANT
SHEATH GLIDE SLENDER 4/5FR (SHEATH) ×1 IMPLANT
SHEATH PROBE COVER 6X72 (BAG) ×2 IMPLANT
TRANSDUCER W/STOPCOCK (MISCELLANEOUS) ×2 IMPLANT
WIRE HI TORQ VERSACORE-J 145CM (WIRE) ×2 IMPLANT

## 2021-05-18 NOTE — Progress Notes (Signed)
?Progress Note ? ? ?Patient: Matthew [name] [id number] DOB: December 03, 1940 DOA: 05/13/2021     4 ?DOS: the patient was seen and examined on 05/18/2021 ?  ?Brief hospital course: ?Mr. Schweikart was admitted to the hospital with the working diagnosis of decompensated heart failure.  ? ?81 yo male with the past medical history of CAD, heart failure, hypertension, severe mitral regurgitation, and BPH, who presented with chest pain and dyspnea. Reported acute onset of substernal/ left sided chest pain, radiated to his left arm and associated with dyspnea. Positive lower extremity edema.  ?Recent hospitalization 03/25 to 05/10/21 for urinary tract infection, complicated with metabolic encephalopathy. Outpatient follow up with cardiology 24 hrs before patient admitted this time, diuretic therapy was increased and plan for further work up cardiac catheterization and transesophageal echocardiogram. On his initial physical examination his blood pressure was 106/95, HR 107, RR 22, 02 saturation 97%, positive JVD, heart with S1 and S2 present and tachycardic, lungs with no wheezing or rales, abdomen not distended and positive lower extremity edema.  ? ?Na 136, K 5.0, Cl 102, bicarbonate at 25, glucose 91 bun 18, cr 1,42 ?BNP >4,500 ?Wbc 4.3, hgb 12.9 hct 41.1 plt 104 ? ?Chest radiograph with cardiomegaly, positive hilar vascular congestion and fluid in the right fissure.  ? ?EKG 93 bpm, normal axis, qtc 509, right bundle branch block, sinus rhythm with PVC, no significant ST segment or T wave changes.  ? ?CT chest with subsegmental and segmental pulmonary artery fillings in the right lower lobe. Small bilateral pleural effusions,  ?Aneurysmal dilatation of the ascending aorta.  ? ?Patient was placed on furosemide for diuresis, and anticoagulation with apixaban.  ?Further work up with right and left cardiac catheterization plus TEE in preparation for possible MitraClip.  ? ? ?Assessment and Plan: ?* Acute pulmonary embolism  ([place]) ?Continue anticoagulation with heparin. ?Oxygenating at 99% on room air.  ? ?Acute on chronic congestive heart failure ([place]) ?Biventricular heart failure ?Severe mitral regurgitation ? ?Echocardiogram with LV systolic function reduced to 25%, with global hypokinesis. RV systolic function is preserved. RV size with severe dilatation, RVSP 35,6 mmHg, severe dilatation of right and left atrium. Severe mitral regurgitation.  ? ?Urine output is 2,000 ml over last 24 hrs, since admission volume is negative 14,569 ml.  ?Systolic blood pressure 94 to 103 mmHg.  ? ?Continue with spironolactone ?Blood pressure support with midodrine.  ? ? ? ? ?Ascending aortic aneurysm ([place]) ?CT showing aneurysmal dilation of ascending aorta measuring 4.5 cm.  ?-Radiologist recommending semi-annual imaging followup by CTA or MRA ?-Outpatient cardiothoracic surgery follow-up ? ?NSVT (nonsustained ventricular tachycardia) ([place]) ?Positive PVC. ?Patient with heart rate controlled, he has been placed on amiodarone with good toleration.  ? ?Chronic kidney disease, stage 3a ([place]) ?Renal function with serum cr at 1,43 with K at 4,8 and serum bicarbonate at 31. ?Continue with spironolactone and follow up renal function in am. ?Avoid hypotension and nephrotoxic medications.  ?His volume status has improved.  ? ? ? ? ?  ? ?Subjective: patient is feeling well, no dyspnea or chest pain, lower extremity edema has improved.,  ? ?Physical Exam: ?Vitals:  ? 05/18/21 1010 05/18/21 1046 05/18/21 1256 05/18/21 1400  ?BP: 94/61  110/74 116/89  ?Pulse: 95  84 88  ?Resp: (!) 23  18 (!) 28  ?Temp:   97.8 ?F (36.6 ?C)   ?TempSrc:   Oral   ?SpO2: 99% 100% 99% 99%  ?Weight:      ?Height:      ? ?  Neurology awake and alert ?ENT with no pallor ?Cardiovascular with S1 and S2 present and rhythmic with no gallops, positive systolic murmur at the apex. ?No JVD ?Trace lower extremity edema  ?Respiratory with no wheezing or rales  ?Abdomen with no distention  ?Data  Reviewed: ? ? ? ?Family Communication: no family at the bedside  ? ?Disposition: ?Status is: Inpatient ?Remains inpatient appropriate because: heart failure with severe mitral regurgitation  ? Planned Discharge Destination: Home ? ? ? ?Author: ?Tawni Millers, MD ?05/18/2021 4:00 PM ? ?For on call review [email or web address].  ?

## 2021-05-18 NOTE — Interval H&P Note (Signed)
History and Physical Interval Note: ? ?05/18/2021 ?9:22 AM ? ?Matthew [name]  has presented today for surgery, with the diagnosis of MR.  The various methods of treatment have been discussed with the patient and family. After consideration of risks, benefits and other options for treatment, the patient has consented to  Procedure(s): ?TRANSESOPHAGEAL ECHOCARDIOGRAM (TEE) (N/A) as a surgical intervention.  The patient's history has been reviewed, patient examined, no change in status, stable for surgery.  I have reviewed the patient's chart and labs.  Questions were answered to the patient's satisfaction.   ? ? ?[name] ? ? ?

## 2021-05-18 NOTE — H&P (View-Only) (Signed)
? ? Advanced Heart Failure Rounding Note ? ?PCP-Cardiologist: Larae Grooms, MD  ? ?Subjective:   ? ?Diuretics on hold. Denies CP or SOB. Co-ox 72.% CVP 5-6 Still with frequent PVCs,  ? ?Objective:   ?Weight Range: ?72.5 kg ?Body mass index is 18 kg/m?.  ? ?Vital Signs:   ?Temp:  [97.1 ?F (36.2 ?C)-98.4 ?F (36.9 ?C)] 97.3 ?F (36.3 ?C) (04/05 0950) ?Pulse Rate:  [56-100] 95 (04/05 1010) ?Resp:  [17-24] 23 (04/05 1010) ?BP: (94-106)/(61-74) 94/61 (04/05 1010) ?SpO2:  [94 %-100 %] 99 % (04/05 1010) ?Weight:  [72.5 kg] 72.5 kg (04/05 0252) ?Last BM Date :  (UTA) ? ?Weight change: ?Filed Weights  ? 05/14/21 0605 05/17/21 0438 05/18/21 0252  ?Weight: 99.9 kg 90.2 kg 72.5 kg  ? ? ?Intake/Output:  ? ?Intake/Output Summary (Last 24 hours) at 05/18/2021 1034 ?Last data filed at 05/18/2021 0556 ?Gross per 24 hour  ?Intake 413.03 ml  ?Output 2000 ml  ?Net -1586.97 ml  ? ?  ? ? ?Physical Exam  ? ?General:  Weak appearing. No resp difficulty ?HEENT: normal ?Neck: supple. JVP 6 Carotids 2+ bilat; no bruits. No lymphadenopathy or thryomegaly appreciated. ?Cor: PMI nondisplaced. Irregular rate & rhythm. 2/6 MR/tR ?Lungs: clear ?Abdomen: soft, nontender, nondistended. No hepatosplenomegaly. No bruits or masses. Good bowel sounds. ?Extremities: no cyanosis, clubbing, rash, edema ?Neuro: alert & orientedx3, cranial nerves grossly intact. moves all 4 extremities w/o difficulty. Affect pleasant ? ? ? ?Telemetry  ? ?NSR 80-90s w/ frequent PVCs Personally reviewed ? ?EKG  ?  ?No new EKG to review  ? ?Labs  ?  ?CBC ?Recent Labs  ?  05/17/21 ?0416 05/18/21 ?0448  ?WBC 5.2 5.8  ?HGB 13.2 13.8  ?HCT 41.5 42.1  ?MCV 78.9* 78.1*  ?PLT 159 166  ? ? ?Basic Metabolic Panel ?Recent Labs  ?  05/16/21 ?0145 05/17/21 ?0416 05/18/21 ?0448  ?NA 135 136  --   ?K 4.6 4.8  --   ?CL 102 97*  --   ?CO2 28 31  --   ?GLUCOSE 88 88  --   ?BUN 18 19  --   ?CREATININE 1.43* 1.43*  --   ?CALCIUM 8.6* 9.1  --   ?MG 2.0 2.0 2.1  ? ? ?Liver Function Tests ?No  results for input(s): AST, ALT, ALKPHOS, BILITOT, PROT, ALBUMIN in the last 72 hours. ?No results for input(s): LIPASE, AMYLASE in the last 72 hours. ?Cardiac Enzymes ?No results for input(s): CKTOTAL, CKMB, CKMBINDEX, TROPONINI in the last 72 hours. ? ?BNP: ?BNP (last 3 results) ?Recent Labs  ?  05/09/21 ?[id number] 05/10/21 ?0205 05/13/21 ?2142  ?BNP >4,500.0* >4,500.0* >4,500.0*  ? ? ? ?ProBNP (last 3 results) ?No results for input(s): PROBNP in the last 8760 hours. ? ? ?D-Dimer ?No results for input(s): DDIMER in the last 72 hours. ?Hemoglobin A1C ?No results for input(s): HGBA1C in the last 72 hours. ?Fasting Lipid Panel ?No results for input(s): CHOL, HDL, LDLCALC, TRIG, CHOLHDL, LDLDIRECT in the last 72 hours. ?Thyroid Function Tests ?No results for input(s): TSH, T4TOTAL, T3FREE, THYROIDAB in the last 72 hours. ? ?Invalid input(s): FREET3 ? ?Other results: ? ? ?Imaging  ? ? ?No results found. ? ? ?Medications:   ? ? ?Scheduled Medications: ? [MAR Hold] amiodarone  200 mg Oral BID  ? [MAR Hold] atorvastatin  10 mg Oral Daily  ? [MAR Hold] Chlorhexidine Gluconate Cloth  6 each Topical Daily  ? [MAR Hold] leptospermum manuka honey  1 application. Topical Daily  ? [  MAR Hold] magnesium oxide  400 mg Oral Daily  ? [MAR Hold] midodrine  5 mg Oral TID WC  ? [MAR Hold] sodium chloride flush  3 mL Intravenous Q12H  ? [MAR Hold] spironolactone  12.5 mg Oral Daily  ? ? ?Infusions: ? sodium chloride 20 mL/hr at 05/18/21 0849  ? sodium chloride    ? sodium chloride Stopped (05/18/21 0708)  ? heparin 1,800 Units/hr (05/18/21 0556)  ? ? ?PRN Medications: ?sodium chloride, [MAR Hold] acetaminophen **OR** [MAR Hold] acetaminophen, [MAR Hold] sodium chloride flush, sodium chloride flush ? ? ? ?Patient Profile  ? ?81 y.o. male with history of chronic biventricular HF, NICM, CAD, severe MR/TR, HTN, obesity and multiple recent admissions, now admitted w/ acute PE and a/c CHF.  ? ?Echo this admission, EF 25%, mildly dilated RV with  moderately decreased systolic function, severe MR and TR. IVC dilated.  ? ?Venous dopplers negative for DVT  ? ?Assessment/Plan  ? ?1. Acute on chronic Biventricular HF   ?- ECHO 04/2020 EF 25-30% with mild MR.   ?- Echo 04/04/21 EF LVEF 20-25%, RV moderately reduced, LA/RA severely dilated, and severe MR.  ?- Echo per Dr. Aundra Dubin read: EF 25%, LV severely dilated, heavy trabeculations with no LV thrombus, RV mildly dilated, RV moderately reduced, severe BAE, severe MR, severe TR, dilated IVC with estimated RAP 15 mmHg ?- Most recent cath 2012.  ?- ? How much PVCs contributing, frequent ectopy on telemetry (see below) ?- BNP > 4,500. Lactic acid 1.6 on 04/01. Co-ox ok at 70%  ?- Volume status looks good.  ?- GDMT limited d/t hypotension. Continue midodrine 5 mg TID ?- Continue Spiro 12.5 mg daily  ?- Suspect PVCs may be contributing to CM  ?- Plan TEE and R/L cath today ?  ?2. CAD ?- Cath 2012- severe stenosis of the diagonal ?- No s/s angina ?- For cath today ?- HS troponin 14>12 ?- Continue statin ?  ?3. Mitral Regurgitation ?- Severe on echo this admit and last echo 02/23 ?- functional, severely dilated LA and LV  ?- HF optimization per above  ?- TEE today to assess ?  ?4. Severe TR ?- functional, severely dilated RA and RV   ?- HF optimization per above  ?  ?5. Acute PE ?- Remains on heparin. Switch back to Eliquis after procedures  ?- No evidence of DVT on [place] ?- ? 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  ?6. Hyperlipidemia ?- Continue  statin ?  ?7. CKD IIIa ?- Scr 1.43, baseline around 1.4-1.7 last few months ?- Stable w/ diuresis SCr 1,.4 today  ?- follow BMP  ?  ?8. NSVT/PVCs ?- 21 beat run NSVT on 04/02 + frequent PVCs  ?- Amio 200 mg BID started  ?- improving, less ectopy on tele today  ?- Keep K > 4 and Mag > 2 ?- may need mexilitene  ?  ?9. Right LE wounds ?- occurred after bumping leg on furniture a few weeks ago ?- ABIs overestimated d/t noncompressible  vessels ?- WOC has seen, recs provided  ? ? ?Length of Stay: 4 ? ?Glori Bickers, MD  ?05/18/2021, 10:34 AM ? ?Advanced Heart Failure Team ?Pager [phone] (M-F; 7a - 5p)  ?Please contact [facility] for night-coverage after hours (5p -7a ) and weekends on [email or web address] ? ? ? ? ? ? ?

## 2021-05-18 NOTE — CV Procedure (Signed)
? ? ?  TRANSESOPHAGEAL ECHOCARDIOGRAM  ? ?NAME:  Matthew [name]   MRN: [id number] ?DOB:  Sep 11, 1940   ADMIT DATE: 05/13/2021 ? ?INDICATIONS: ? ?Mitral regurgitation ? ?PROCEDURE:  ? ?Informed consent was obtained prior to the procedure. The risks, benefits and alternatives for the procedure were discussed and the patient comprehended these risks.  Risks include, but are not limited to, cough, sore throat, vomiting, nausea, somnolence, esophageal and stomach trauma or perforation, bleeding, low blood pressure, aspiration, pneumonia, infection, trauma to the teeth and death.   ? ?After a procedural time-out, the patient was sedated by the anesthesia service. The transesophageal probe was inserted in the esophagus and stomach without difficulty and multiple views were obtained.  ? ? ?COMPLICATIONS:   ? ?There were no immediate complications. ? ?FINDINGS: ? ?LEFT VENTRICLE: EF = 15-20%. Severely dilated. Severe global HK ? ?RIGHT VENTRICLE: Dilated. Severe HK.  ? ?LEFT ATRIUM: Markedly dilated ? ?LEFT ATRIAL APPENDAGE: No thrombus.  ? ?RIGHT ATRIUM: Dilated ? ?AORTIC VALVE:  Trileaflet. Trivial AI ? ?MITRAL VALVE:   Mild restriction of P2. Severe central/posterior MR due to P2 restriction and annular dilation.  ? ?TRICUSPID VALVE: Moderate TR ? ?PULMONIC VALVE: Normal. Trivial PR ? ?INTERATRIAL SEPTUM: No PFO or ASD. ? ?PERICARDIUM: No effusion ? ?DESCENDING AORTA: Mild plaque ? ? ?Mirage Pfefferkorn,MD ?10:30 AM ? ? ?

## 2021-05-18 NOTE — Progress Notes (Signed)
PT Cancellation Note ? ?Patient Details ?Name: Matthew [name] ?MRN: [id number] ?DOB: 1940/12/27 ? ? ?Cancelled Treatment:    Reason Eval/Treat Not Completed: Patient at procedure or test/unavailable (Pt. is off floor for Endo.  PT to f/u as able.) ? ?Zykira Matlack A. Tabbitha Janvrin, PT, DPT ?Acute Rehabilitation Services ?Office: [phone]  ?Lezlee Gills A Judyann Casasola ?05/18/2021, 11:02 AM ? ? ?

## 2021-05-18 NOTE — Interval H&P Note (Signed)
History and Physical Interval Note: ? ?05/18/2021 ?10:41 AM ? ?Matthew [name]  has presented today for surgery, with the diagnosis of heart failure and severe mitral regurgitation.  The various methods of treatment have been discussed with the patient and family. After consideration of risks, benefits and other options for treatment, the patient has consented to  Procedure(s): ?RIGHT/LEFT HEART CATH AND CORONARY ANGIOGRAPHY (N/A) and possible coronary angioplasty as a surgical intervention.  The patient's history has been reviewed, patient examined, no change in status, stable for surgery.  I have reviewed the patient's chart and labs.  Questions were answered to the patient's satisfaction.   ? ? ?Raffael Bugarin ? ? ?

## 2021-05-18 NOTE — Hospital Course (Addendum)
Mr. Rohs was admitted to the hospital with the working diagnosis of decompensated heart failure in the setting of mitral regurgitation.  ? ?81 yo male with the past medical history of CAD, heart failure, hypertension, severe mitral regurgitation, and BPH, who presented with chest pain and dyspnea. Reported acute onset of substernal/ left sided chest pain, radiated to his left arm and associated with dyspnea. Positive lower extremity edema.  ?Recent hospitalization 03/25 to 05/10/21 for urinary tract infection, complicated with metabolic encephalopathy. Outpatient follow up with cardiology 24 hrs before patient admitted this time, diuretic therapy was increased and plan for further work up with cardiac catheterization and transesophageal echocardiogram. On his initial physical examination his blood pressure was 106/95, HR 107, RR 22, 02 saturation 97%, positive JVD, heart with S1 and S2 present and tachycardic, lungs with no wheezing or rales, abdomen not distended and positive lower extremity edema.  ? ?Na 136, K 5.0, Cl 102, bicarbonate at 25, glucose 91 bun 18, cr 1,42 ?BNP >4,500 ?Wbc 4.3, hgb 12.9 hct 41.1 plt 104 ? ?Chest radiograph with cardiomegaly, positive hilar vascular congestion and fluid in the right fissure.  ? ?EKG 93 bpm, normal axis, qtc 509, right bundle branch block, sinus rhythm with PVC, no significant ST segment or T wave changes.  ? ?CT chest with subsegmental and segmental pulmonary artery fillings defects in the right lower lobe. Small bilateral pleural effusions,  ?Aneurysmal dilatation of the ascending aorta.  ? ?Patient was placed on furosemide for diuresis, and anticoagulation with apixaban.  ?Diuresis with furosemide.  ?Frequent PVC and NSVT, placed on amiodarone and mexiletine. ? ?Cardiac catheterization with no significant coronary artery disease. Pulmonary capillary wedge pressure 3. CO/CI 6.1/3.0 ?TEE with LV EF less than 20%, severe reduction in RV systolic function. Sever mitral  regurgitation.  ? ?Plan for possible mitral valve intervention later this month. ?His volume status has improved but positive hypoperfusion. ?04/13 resumed on milrinone infusion.  ? ?Patient continue to have ectopy with PVC despite antiarrhythmic therapy. Plan for mitral transcatheter edge to edge repair.  ? ?Plan to transfer patient to CIR when renal function improves.  ? ?

## 2021-05-18 NOTE — Progress Notes (Signed)
ANTICOAGULATION CONSULT NOTE ? ?Pharmacy Consult for heparin ?Indication: pulmonary embolus ? ?Allergies  ?Allergen Reactions  ? Lactose Intolerance (Gi) Other (See Comments)  ?  unknown  ? ? ?Patient Measurements: ?Height: 6\' 7"  (200.7 cm) ?Weight: 72.5 kg (159 lb 12.8 oz) ?IBW/kg (Calculated) : 93.7 ?Heparin Dosing Weight: 90kg ? ?Vital Signs: ?Temp: 97.9 ?F (36.6 ?C) (04/05 0252) ?Temp Source: Oral (04/05 0252) ?BP: 97/67 (04/05 0252) ?Pulse Rate: 97 (04/05 0252) ? ?Labs: ?Recent Labs  ?  05/15/21 ?1251 05/16/21 ?0145 05/16/21 ?0145 05/17/21 ?0416 05/18/21 ?0448 05/18/21 ?0500  ?HGB  --  11.7*   < > 13.2 13.8  --   ?HCT  --  35.8*  --  41.5 42.1  --   ?PLT  --  148*  --  159 166  --   ?APTT  --   --   --   --  95*  --   ?HEPARINUNFRC  --   --   --   --   --  >1.10*  ?CREATININE 1.49* 1.43*  --  1.43*  --   --   ? < > = values in this interval not displayed.  ? ? ? ?Estimated Creatinine Clearance: 42.2 mL/min (A) (by C-G formula based on SCr of 1.43 mg/dL (H)). ? ? ?Assessment: ?71 yoM admitted with new PE. No anticoagulation PTA. Pt started on heparin infusion and ultimately apixaban, but now needs further cardiac workup so will transition back to heparin. Pt was therapeutic previously on 1800 units/h so will start here. Last apixaban dose was 0900. ? ?Heparin level >1.1 (affected by apixaban), aPTT 95 sec (therapeutic) on infusion at 1800 units/hr. No bleeding noted.  ? ?Goal of Therapy:  ?Heparin level 0.3-0.7 units/ml ?aPTT 66-102 seconds ?Monitor platelets by anticoagulation protocol: Yes ?  ?Plan:  ?Continue heparin 1800 units/hr ?F/u 6 hr confirmatory aPTT ? ?Sherlon Handing, PharmD, BCPS ?Please see amion for complete clinical pharmacist phone list ?05/18/2021 ? ? ? ?

## 2021-05-18 NOTE — Progress Notes (Signed)
?  Echocardiogram ?Echocardiogram Transesophageal has been performed. ? Jarome Matin ?05/18/2021, 10:11 AM ?

## 2021-05-18 NOTE — Progress Notes (Signed)
Pt's BP systolic is 90 and HR is sustaining in the 110's. Pt is asymptomatic. MD notified  ?

## 2021-05-18 NOTE — Anesthesia Postprocedure Evaluation (Signed)
Anesthesia Post Note ? ?Patient: MASAHIRO IGLESIA ? ?Procedure(s) Performed: TRANSESOPHAGEAL ECHOCARDIOGRAM (TEE) ? ?  ? ?Patient location during evaluation: Endoscopy ?Anesthesia Type: MAC ?Level of consciousness: awake, oriented and patient cooperative ?Pain management: pain level controlled ?Vital Signs Assessment: post-procedure vital signs reviewed and stable ?Respiratory status: spontaneous breathing, nonlabored ventilation and respiratory function stable ?Cardiovascular status: blood pressure returned to baseline and stable ?Postop Assessment: no apparent nausea or vomiting ?Anesthetic complications: no ?Comments: Pt going to cath lab ? ? ?No notable events documented. ? ?Last Vitals:  ?Vitals:  ? 05/18/21 1010 05/18/21 1046  ?BP: 94/61   ?Pulse: 95   ?Resp: (!) 23   ?Temp:    ?SpO2: 99% 100%  ?  ?Last Pain:  ?Vitals:  ? 05/18/21 1120  ?TempSrc:   ?PainSc: 2   ? ? ?  ?  ?  ?  ?  ?  ? ?Chaela Branscum,E. Alejo Beamer ? ? ? ? ?

## 2021-05-18 NOTE — Progress Notes (Signed)
ANTICOAGULATION CONSULT NOTE ? ?Pharmacy Consult for heparin ?Indication: pulmonary embolus ? ?Allergies  ?Allergen Reactions  ? Lactose Intolerance (Gi) Other (See Comments)  ?  unknown  ? ? ?Patient Measurements: ?Height: 6\' 7"  (200.7 cm) ?Weight: 72.5 kg (159 lb 12.8 oz) ?IBW/kg (Calculated) : 93.7 ?Heparin Dosing Weight: 90kg ? ?Vital Signs: ?Temp: 97.8 ?F (36.6 ?C) (04/05 1256) ?Temp Source: Oral (04/05 1256) ?BP: 104/63 (04/05 1736) ?Pulse Rate: 99 (04/05 1736) ? ?Labs: ?Recent Labs  ?  05/16/21 ?0145 05/17/21 ?0416 05/18/21 ?0448 05/18/21 ?0500 05/18/21 ?1056 05/18/21 ?1104 05/18/21 ?1107  ?HGB 11.7* 13.2 13.8  --  13.6 15.3 12.9*  ?HCT 35.8* 41.5 42.1  --  40.0 45.0 38.0*  ?PLT 148* 159 166  --   --   --   --   ?APTT  --   --  95*  --   --   --   --   ?HEPARINUNFRC  --   --   --  >1.10*  --   --   --   ?CREATININE 1.43* 1.43*  --   --   --   --   --   ? ? ? ?Estimated Creatinine Clearance: 42.2 mL/min (A) (by C-G formula based on SCr of 1.43 mg/dL (H)). ? ? ?Assessment: ?7 yoM admitted with new PE. No anticoagulation PTA. Pt started on heparin infusion and ultimately apixaban, but now needs further cardiac workup so will transition back to heparin. Pt was therapeutic previously on 1800 units/h so will start here. Last apixaban dose was 0900. ? ?Heparin level >1.1 (affected by apixaban), aPTT 95 sec (therapeutic) on infusion at 1800 units/hr. No bleeding noted.  ? ?Pt is s/p cath. Plan to resume heparin 4 hr post TR band removal (1900). Resume heparin tonight and check level in AM.  ? ?Goal of Therapy:  ?Heparin level 0.3-0.7 units/ml ?aPTT 66-102 seconds ?Monitor platelets by anticoagulation protocol: Yes ?  ?Plan:  ?Resume heparin at 1800 units/hr at 2300 ?Check 8 hr HL and PTT in AM ? ?Onnie Boer, PharmD, BCIDP, AAHIVP, CPP ?Infectious Disease Pharmacist ?05/18/2021 7:35 PM ? ? ? ? ? ?

## 2021-05-18 NOTE — Assessment & Plan Note (Signed)
Positive PVC. ?Patient with heart rate controlled, he has been placed on amiodarone with good toleration.  ?

## 2021-05-18 NOTE — Progress Notes (Signed)
? ? Advanced Heart Failure Rounding Note ? ?PCP-Cardiologist: Larae Grooms, MD  ? ?Subjective:   ? ?Diuretics on hold. Denies CP or SOB. Co-ox 72.% CVP 5-6 Still with frequent PVCs,  ? ?Objective:   ?Weight Range: ?72.5 kg ?Body mass index is 18 kg/m?.  ? ?Vital Signs:   ?Temp:  [97.1 ?F (36.2 ?C)-98.4 ?F (36.9 ?C)] 97.3 ?F (36.3 ?C) (04/05 0950) ?Pulse Rate:  [56-100] 95 (04/05 1010) ?Resp:  [17-24] 23 (04/05 1010) ?BP: (94-106)/(61-74) 94/61 (04/05 1010) ?SpO2:  [94 %-100 %] 99 % (04/05 1010) ?Weight:  [72.5 kg] 72.5 kg (04/05 0252) ?Last BM Date :  (UTA) ? ?Weight change: ?Filed Weights  ? 05/14/21 0605 05/17/21 0438 05/18/21 0252  ?Weight: 99.9 kg 90.2 kg 72.5 kg  ? ? ?Intake/Output:  ? ?Intake/Output Summary (Last 24 hours) at 05/18/2021 1034 ?Last data filed at 05/18/2021 0556 ?Gross per 24 hour  ?Intake 413.03 ml  ?Output 2000 ml  ?Net -1586.97 ml  ? ?  ? ? ?Physical Exam  ? ?General:  Weak appearing. No resp difficulty ?HEENT: normal ?Neck: supple. JVP 6 Carotids 2+ bilat; no bruits. No lymphadenopathy or thryomegaly appreciated. ?Cor: PMI nondisplaced. Irregular rate & rhythm. 2/6 MR/tR ?Lungs: clear ?Abdomen: soft, nontender, nondistended. No hepatosplenomegaly. No bruits or masses. Good bowel sounds. ?Extremities: no cyanosis, clubbing, rash, edema ?Neuro: alert & orientedx3, cranial nerves grossly intact. moves all 4 extremities w/o difficulty. Affect pleasant ? ? ? ?Telemetry  ? ?NSR 80-90s w/ frequent PVCs Personally reviewed ? ?EKG  ?  ?No new EKG to review  ? ?Labs  ?  ?CBC ?Recent Labs  ?  05/17/21 ?0416 05/18/21 ?0448  ?WBC 5.2 5.8  ?HGB 13.2 13.8  ?HCT 41.5 42.1  ?MCV 78.9* 78.1*  ?PLT 159 166  ? ? ?Basic Metabolic Panel ?Recent Labs  ?  05/16/21 ?0145 05/17/21 ?0416 05/18/21 ?0448  ?NA 135 136  --   ?K 4.6 4.8  --   ?CL 102 97*  --   ?CO2 28 31  --   ?GLUCOSE 88 88  --   ?BUN 18 19  --   ?CREATININE 1.43* 1.43*  --   ?CALCIUM 8.6* 9.1  --   ?MG 2.0 2.0 2.1  ? ? ?Liver Function Tests ?No  results for input(s): AST, ALT, ALKPHOS, BILITOT, PROT, ALBUMIN in the last 72 hours. ?No results for input(s): LIPASE, AMYLASE in the last 72 hours. ?Cardiac Enzymes ?No results for input(s): CKTOTAL, CKMB, CKMBINDEX, TROPONINI in the last 72 hours. ? ?BNP: ?BNP (last 3 results) ?Recent Labs  ?  05/09/21 ?[id number] 05/10/21 ?0205 05/13/21 ?2142  ?BNP >4,500.0* >4,500.0* >4,500.0*  ? ? ? ?ProBNP (last 3 results) ?No results for input(s): PROBNP in the last 8760 hours. ? ? ?D-Dimer ?No results for input(s): DDIMER in the last 72 hours. ?Hemoglobin A1C ?No results for input(s): HGBA1C in the last 72 hours. ?Fasting Lipid Panel ?No results for input(s): CHOL, HDL, LDLCALC, TRIG, CHOLHDL, LDLDIRECT in the last 72 hours. ?Thyroid Function Tests ?No results for input(s): TSH, T4TOTAL, T3FREE, THYROIDAB in the last 72 hours. ? ?Invalid input(s): FREET3 ? ?Other results: ? ? ?Imaging  ? ? ?No results found. ? ? ?Medications:   ? ? ?Scheduled Medications: ? [MAR Hold] amiodarone  200 mg Oral BID  ? [MAR Hold] atorvastatin  10 mg Oral Daily  ? [MAR Hold] Chlorhexidine Gluconate Cloth  6 each Topical Daily  ? [MAR Hold] leptospermum manuka honey  1 application. Topical Daily  ? [  MAR Hold] magnesium oxide  400 mg Oral Daily  ? [MAR Hold] midodrine  5 mg Oral TID WC  ? [MAR Hold] sodium chloride flush  3 mL Intravenous Q12H  ? [MAR Hold] spironolactone  12.5 mg Oral Daily  ? ? ?Infusions: ? sodium chloride 20 mL/hr at 05/18/21 0849  ? sodium chloride    ? sodium chloride Stopped (05/18/21 0708)  ? heparin 1,800 Units/hr (05/18/21 0556)  ? ? ?PRN Medications: ?sodium chloride, [MAR Hold] acetaminophen **OR** [MAR Hold] acetaminophen, [MAR Hold] sodium chloride flush, sodium chloride flush ? ? ? ?Patient Profile  ? ?81 y.o. male with history of chronic biventricular HF, NICM, CAD, severe MR/TR, HTN, obesity and multiple recent admissions, now admitted w/ acute PE and a/c CHF.  ? ?Echo this admission, EF 25%, mildly dilated RV with  moderately decreased systolic function, severe MR and TR. IVC dilated.  ? ?Venous dopplers negative for DVT  ? ?Assessment/Plan  ? ?1. Acute on chronic Biventricular HF   ?- ECHO 04/2020 EF 25-30% with mild MR.   ?- Echo 04/04/21 EF LVEF 20-25%, RV moderately reduced, LA/RA severely dilated, and severe MR.  ?- Echo per Dr. Aundra Dubin read: EF 25%, LV severely dilated, heavy trabeculations with no LV thrombus, RV mildly dilated, RV moderately reduced, severe BAE, severe MR, severe TR, dilated IVC with estimated RAP 15 mmHg ?- Most recent cath 2012.  ?- ? How much PVCs contributing, frequent ectopy on telemetry (see below) ?- BNP > 4,500. Lactic acid 1.6 on 04/01. Co-ox ok at 70%  ?- Volume status looks good.  ?- GDMT limited d/t hypotension. Continue midodrine 5 mg TID ?- Continue Spiro 12.5 mg daily  ?- Suspect PVCs may be contributing to CM  ?- Plan TEE and R/L cath today ?  ?2. CAD ?- Cath 2012- severe stenosis of the diagonal ?- No s/s angina ?- For cath today ?- HS troponin 14>12 ?- Continue statin ?  ?3. Mitral Regurgitation ?- Severe on echo this admit and last echo 02/23 ?- functional, severely dilated LA and LV  ?- HF optimization per above  ?- TEE today to assess ?  ?4. Severe TR ?- functional, severely dilated RA and RV   ?- HF optimization per above  ?  ?5. Acute PE ?- Remains on heparin. Switch back to Eliquis after procedures  ?- No evidence of DVT on [place] ?- ? 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  ?6. Hyperlipidemia ?- Continue  statin ?  ?7. CKD IIIa ?- Scr 1.43, baseline around 1.4-1.7 last few months ?- Stable w/ diuresis SCr 1,.4 today  ?- follow BMP  ?  ?8. NSVT/PVCs ?- 21 beat run NSVT on 04/02 + frequent PVCs  ?- Amio 200 mg BID started  ?- improving, less ectopy on tele today  ?- Keep K > 4 and Mag > 2 ?- may need mexilitene  ?  ?9. Right LE wounds ?- occurred after bumping leg on furniture a few weeks ago ?- ABIs overestimated d/t noncompressible  vessels ?- WOC has seen, recs provided  ? ? ?Length of Stay: 4 ? ?Glori Bickers, MD  ?05/18/2021, 10:34 AM ? ?Advanced Heart Failure Team ?Pager [phone] (M-F; 7a - 5p)  ?Please contact [facility] for night-coverage after hours (5p -7a ) and weekends on [email or web address] ? ? ? ? ? ? ?

## 2021-05-18 NOTE — Assessment & Plan Note (Addendum)
CKD stage 3a, Hyponatremia. Hypomagnesemia. Hyperkalemia  ? ?Renal function today with serum cr at 2,0 with K at 4,1 and serum bicarbonate at 22.  ? ?Patient on milrinone and midodrine. ?Diuresis with torsemide.   ?

## 2021-05-18 NOTE — Care Management Important Message (Signed)
Important Message ? ?Patient Details  ?Name: Matthew [name] ?MRN: [id number] ?Date of Birth: 04/13/1940 ? ? ?Medicare Important Message Given:  Yes ? ? ? ? ?Renie Ora ?05/18/2021, 8:27 AM ?

## 2021-05-18 NOTE — Transfer of Care (Signed)
Immediate Anesthesia Transfer of Care Note ? ?Patient: Matthew [name] ? ?Procedure(s) Performed: TRANSESOPHAGEAL ECHOCARDIOGRAM (TEE) ? ?Patient Location: PACU ? ?Anesthesia Type:MAC ? ?Level of Consciousness: drowsy and patient cooperative ? ?Airway & Oxygen Therapy: Patient Spontanous Breathing and Patient connected to nasal cannula oxygen ? ?Post-op Assessment: Report given to RN and Post -op Vital signs reviewed and stable ? ?Post vital signs: Reviewed and stable ? ?Last Vitals:  ?Vitals Value Taken Time  ?BP 95/64 05/18/21 0950  ?Temp 36.3 ?C 05/18/21 0950  ?Pulse 90 05/18/21 0952  ?Resp 20 05/18/21 0952  ?SpO2 96 % 05/18/21 0952  ?Vitals shown include unvalidated device data. ? ?Last Pain:  ?Vitals:  ? 05/18/21 0950  ?TempSrc: Temporal  ?PainSc: Asleep  ?   ? ?Patients Stated Pain Goal: 0 (05/16/21 0853) ? ?Complications: No notable events documented. ?

## 2021-05-19 ENCOUNTER — Other Ambulatory Visit (HOSPITAL_COMMUNITY): Payer: Self-pay

## 2021-05-19 ENCOUNTER — Encounter (HOSPITAL_COMMUNITY): Payer: Self-pay | Admitting: Internal Medicine

## 2021-05-19 DIAGNOSIS — I2699 Other pulmonary embolism without acute cor pulmonale: Secondary | ICD-10-CM | POA: Diagnosis not present

## 2021-05-19 DIAGNOSIS — I340 Nonrheumatic mitral (valve) insufficiency: Secondary | ICD-10-CM | POA: Diagnosis not present

## 2021-05-19 DIAGNOSIS — N1831 Chronic kidney disease, stage 3a: Secondary | ICD-10-CM | POA: Diagnosis not present

## 2021-05-19 DIAGNOSIS — E785 Hyperlipidemia, unspecified: Secondary | ICD-10-CM | POA: Diagnosis present

## 2021-05-19 DIAGNOSIS — I5023 Acute on chronic systolic (congestive) heart failure: Secondary | ICD-10-CM | POA: Diagnosis not present

## 2021-05-19 DIAGNOSIS — I493 Ventricular premature depolarization: Secondary | ICD-10-CM | POA: Diagnosis not present

## 2021-05-19 LAB — COOXEMETRY PANEL
Carboxyhemoglobin: 2.6 % — ABNORMAL HIGH (ref 0.5–1.5)
Methemoglobin: <0.7 % (ref 0.0–1.5)
O2 Saturation: 70.4 %
Total hemoglobin: 13 g/dL (ref 12.0–16.0)

## 2021-05-19 LAB — CBC
HCT: 41.2 % (ref 39.0–52.0)
Hemoglobin: 12.9 g/dL — ABNORMAL LOW (ref 13.0–17.0)
MCH: 25 pg — ABNORMAL LOW (ref 26.0–34.0)
MCHC: 31.3 g/dL (ref 30.0–36.0)
MCV: 79.8 fL — ABNORMAL LOW (ref 80.0–100.0)
Platelets: 178 10*3/uL (ref 150–400)
RBC: 5.16 MIL/uL (ref 4.22–5.81)
RDW: 21.2 % — ABNORMAL HIGH (ref 11.5–15.5)
WBC: 4.9 10*3/uL (ref 4.0–10.5)
nRBC: 0 % (ref 0.0–0.2)

## 2021-05-19 LAB — BASIC METABOLIC PANEL
Anion gap: 4 — ABNORMAL LOW (ref 5–15)
BUN: 25 mg/dL — ABNORMAL HIGH (ref 8–23)
CO2: 28 mmol/L (ref 22–32)
Calcium: 8.6 mg/dL — ABNORMAL LOW (ref 8.9–10.3)
Chloride: 99 mmol/L (ref 98–111)
Creatinine, Ser: 1.5 mg/dL — ABNORMAL HIGH (ref 0.61–1.24)
GFR, Estimated: 47 mL/min — ABNORMAL LOW (ref >60)
Glucose, Bld: 83 mg/dL (ref 70–99)
Potassium: 5.1 mmol/L (ref 3.5–5.1)
Sodium: 131 mmol/L — ABNORMAL LOW (ref 135–145)

## 2021-05-19 LAB — HEPARIN LEVEL (UNFRACTIONATED): Heparin Unfractionated: >1.1 IU/mL — ABNORMAL HIGH (ref 0.30–0.70)

## 2021-05-19 LAB — APTT
aPTT: 180 seconds (ref 24–36)
aPTT: >200 seconds (ref 24–36)

## 2021-05-19 LAB — MAGNESIUM: Magnesium: 2.1 mg/dL (ref 1.7–2.4)

## 2021-05-19 MED ORDER — APIXABAN 5 MG PO TABS
5.0000 mg | ORAL_TABLET | Freq: Two times a day (BID) | ORAL | Status: DC
  Administered 2021-05-19 – 2021-05-31 (×26): 5 mg via ORAL
  Filled 2021-05-19 (×26): qty 1

## 2021-05-19 MED ORDER — MEXILETINE HCL 200 MG PO CAPS
200.0000 mg | ORAL_CAPSULE | Freq: Two times a day (BID) | ORAL | Status: DC
  Administered 2021-05-19 – 2021-05-23 (×9): 200 mg via ORAL
  Filled 2021-05-19 (×9): qty 1

## 2021-05-19 MED ORDER — SPIRONOLACTONE 12.5 MG HALF TABLET
12.5000 mg | ORAL_TABLET | Freq: Every day | ORAL | Status: DC
  Administered 2021-05-20 – 2021-05-22 (×3): 12.5 mg via ORAL
  Filled 2021-05-19 (×3): qty 1

## 2021-05-19 MED ORDER — LACTATED RINGERS IV BOLUS
250.0000 mL | Freq: Once | INTRAVENOUS | Status: AC
  Administered 2021-05-19: 250 mL via INTRAVENOUS

## 2021-05-19 MED ORDER — BISACODYL 5 MG PO TBEC
10.0000 mg | DELAYED_RELEASE_TABLET | Freq: Once | ORAL | Status: AC
  Administered 2021-05-19: 10 mg via ORAL
  Filled 2021-05-19: qty 2

## 2021-05-19 NOTE — Assessment & Plan Note (Signed)
Continue with statin therapy.  ?

## 2021-05-19 NOTE — Consult Note (Addendum)
?[facility]   ?MULTIDISCIPLINARY HEART VALVE TEAM ? ?Inpatient MitraClip Consultation:  ? ?Patient ID: Matthew [name]; [id number]; 17-Mar-1940  ? ?Admit date: 05/13/2021 ?Date of Consult: 05/19/2021 ? ?Primary Care Provider: Pcp, No ?Primary Cardiologist: [name], MD/ Dr. Haroldine Laws, MD  ? ?Patient Profile:  ? ?Matthew [name] is a 81 y.o. male with a hx of NICM/chronic HFrEF, CAD with severe stenosis of the diagonal and lower limb of OM1 by cath in 2001 (treated medically), CKD IIIb, HTN, HLD, severe mitral regurgitation, tricuspid regurgitation, ascending thoracic aortic aneurysm (4.5cm 05/2021), obesity, arthritis, thrombocytopenia who was admitted for acute CHF and new pulmonary embolism who is being seen today for the evaluation of severe mitral regurgitation at the request of Dr. Haroldine Laws. ? ?History of Present Illness:  ? ?Matthew [name] is an 81yo male who lives in [place] with his daughter who helps to care for him. He lost his wife about 6 years ago and has lived with her since that time. He also has a son who is nearby. He retired from [employer] as a Therapist, occupational. He seems to be rather functional at home, although uses a [employer] for assistance. He states that he will occasionally walk in his neighborhood for short distances without any issue. He has full upper and lower dentures.  ? ?Matthew [name] has been followed for quite some time by Dr. Haroldine Laws. He was diagnosed with NICM many years ago and underwent LHC in 2001 for a reduced LVEF at 25-30% which showed borderline critical disease in the first diagonal and first OM, otherwise mild nonobstructive disease. This was not felt to be the cause of his cardiomyopathy. His EF improved to 50-55% by 2014 however on more recent echocardiogram imaging from 04/2020, LVEF was back to 25-30%. He has since been followed very closely for GDMT medication titration. ? ?He was admitted 03/2021 with acute CHF. Repeat echocardiogram at  that time showed an EF at 20 to 25%, severe MR and TR. He was diuresed with Lasix gtt and metolazone. He was discharged on Entresto, carvedilol, Aldactone, Farxiga, and Lasix 20 mg twice daily. He was then seen by Dr. Haroldine Laws in the advanced heart failure clinic for [facility] follow up 05/13/21 and thought to be volume overloaded. Plan was to increase diuretics and check OP LHC/RHC and TEE for candidacy of possible MitraClip placement.  ? ?Unfortunately he then presented to [facility] later that day with acute onset of SOB while sitting and watching television. He was found to be acutely volume overloaded with a BNP >4500 and CXR with retrocardiac patchy airspace opacities. Chest CTA showed segmental and subsegmental pulmonary artery filling defects in RLL, RV distended c/w possible RV strain, small pleural effusions, distended pulmonary strunk suggesting pulmonary artery hypertension, ascending aortic aneurysm measuring 4.5 cm. He was started on IV Heparin and treated with IV Lasix. He has since been transitioned to Eliquis and tolerating well. Subsequent echocardiogram showed an EF at 25%, normal RV function, severe RV dilatation, RVSP 36 mmHg, severe biatrial enlargement, severe mitral vegetation, severe tricuspid regurgitation, dilated ascending aorta measuring 45 mm, RAP 15. GMDT was held due to hypotension.   ? ?AHF team consulted at which time the plan was for [facility] and TEE once more volume stable. He underwent R/LHC yesterday which showed mild non-obstructive CAD with EF at <20%, low filling pressures with normal CO, no significant v-waves in PCWP despite presence of severe MR on TEE. TEE 05/18/21 with mild restriction of P2, severe central/posterior  MR due to P2 restriction and annular dilation, EF 15-20%, LV severely dilated w/ severe HK, RV dilated + severe HK, and mod TR. GDMT and diuretics continue to be held due to low filling pressures on cath and soft BP's. He has been quite tachycardia with very frequent PVCs,  which is felt to be contributing to his further decrease in LV function. He was started on Amiodarone and mexiletine, along with midodrine for BP.  ? ?On my exam, his biggest complaint seems to be his constipation. He is up to the chair and is without SOB. Telemetry shows sinus tachycardiac however less PVCs then earlier today. He denies chest pain, palpitations, LE edema, orthopnea, dizziness, or presyncope/syncope.  ?  ?Past Medical History:  ?Diagnosis Date  ? CAD (coronary artery disease)   ? Chronic HFrEF (heart failure with reduced ejection fraction) ([place])   ? Chronic kidney disease, stage 3b ([place])   ? Encephalopathy 04/2021  ? admitted for metabolic encephalopathy  ? HTN (hypertension)   ? Hyperlipidemia   ? NICM (nonischemic cardiomyopathy) ([place])   ? Obesity   ? Osteoarthritis   ? Severe mitral regurgitation   ? Thoracic aortic aneurysm (TAA) ([place])   ? Thrombocytopenia ([place])   ? noted in labs in 2023  ? Tricuspid regurgitation   ? ? ?Past Surgical History:  ?Procedure Laterality Date  ? CHONDROPLASTY Left 01/11/2017  ? Procedure: CHONDROPLASTY of medial and patella compartment;  Surgeon: Dorna Leitz, MD;  Location: [place];  Service: Orthopedics;  Laterality: Left;  ? KNEE ARTHROSCOPY Left 01/11/2017  ? Procedure: ARTHROSCOPIC IRRIGATION AND DEBRIDMENT KNEE;  Surgeon: Dorna Leitz, MD;  Location: [place];  Service: Orthopedics;  Laterality: Left;  ? KNEE SURGERY    ? MENISECTOMY Left 01/11/2017  ? Procedure: Lateral MENISECTOMY;  Surgeon: Dorna Leitz, MD;  Location: [place];  Service: Orthopedics;  Laterality: Left;  ? RIGHT/LEFT HEART CATH AND CORONARY ANGIOGRAPHY N/A 05/18/2021  ? Procedure: RIGHT/LEFT HEART CATH AND CORONARY ANGIOGRAPHY;  Surgeon: Jolaine Artist, MD;  Location: [place] [facility];  Service: Cardiovascular;  Laterality: N/A;  ? SYNOVECTOMY Left 01/11/2017  ? Procedure: Four compartment SYNOVECTOMY;  Surgeon: Dorna Leitz, MD;  Location: [place];  Service: Orthopedics;  Laterality: Left;  ?   ? ?Inpatient Medications: ?Scheduled Meds: ? amiodarone  200 mg Oral BID  ? apixaban  5 mg Oral BID  ? atorvastatin  10 mg Oral Daily  ? Chlorhexidine Gluconate Cloth  6 each Topical Daily  ? leptospermum manuka honey  1 application. Topical Daily  ? magnesium oxide  400 mg Oral Daily  ? mexiletine  200 mg Oral Q12H  ? midodrine  5 mg Oral TID WC  ? sodium chloride flush  3 mL Intravenous Q12H  ? sodium chloride flush  3 mL Intravenous Q12H  ? [START ON 05/20/2021] spironolactone  12.5 mg Oral Daily  ? ?Continuous Infusions: ? sodium chloride    ? heparin 1,800 Units/hr (05/18/21 2247)  ? lactated ringers    ? ?PRN Meds: ?sodium chloride, acetaminophen, ondansetron (ZOFRAN) IV, sodium chloride flush, sodium chloride flush ? ?Allergies:    ?Allergies  ?Allergen Reactions  ? Lactose Intolerance (Gi) Other (See Comments)  ?  unknown  ? ? ?Social History:   ?Social History  ? ?Socioeconomic History  ? Marital status: Unknown  ?  Spouse name: Not on file  ? Number of children: Not on file  ? Years of education: Not on file  ? Highest education level: 11th  grade  ?Occupational History  ? Occupation: retired  ?Tobacco Use  ? Smoking status: Never  ? Smokeless tobacco: Never  ?Vaping Use  ? Vaping Use: Never used  ?Substance and Sexual Activity  ? Alcohol use: No  ? Drug use: No  ? Sexual activity: Not on file  ?Other Topics Concern  ? Not on file  ?Social History Narrative  ? Not on file  ? ?Social Determinants of Health  ? ?Financial Resource Strain: Low Risk   ? Difficulty of Paying Living Expenses: Not very hard  ?Food Insecurity: No Food Insecurity  ? Worried About Charity fundraiser in the Last Year: Never true  ? Ran Out of Food in the Last Year: Never true  ?Transportation Needs: No Transportation Needs  ? Lack of Transportation (Medical): No  ? Lack of Transportation (Non-Medical): No  ?Physical Activity: Not on file  ?Stress: Not on file  ?Social Connections: Not on file  ?Intimate Partner Violence: Not on file  ?   ?Family History:   ?The patient's family history includes Diabetes in his brother; Hypertension in his father. There is no history of Heart attack or Stroke. ? ?ROS:  ?Please see the history of present i

## 2021-05-19 NOTE — Progress Notes (Signed)
?Progress Note ? ? ?Patient: Matthew [name] [id number] DOB: [birth year] DOA: 05/13/2021     5 ?DOS: the patient was seen and examined on 05/19/2021 ?  ?Brief hospital course: ?Mr. Hellmich was admitted to the hospital with the working diagnosis of decompensated heart failure.  ? ?81 yo male with the past medical history of CAD, heart failure, hypertension, severe mitral regurgitation, and BPH, who presented with chest pain and dyspnea. Reported acute onset of substernal/ left sided chest pain, radiated to his left arm and associated with dyspnea. Positive lower extremity edema.  ?Recent hospitalization 03/25 to 05/10/21 for urinary tract infection, complicated with metabolic encephalopathy. Outpatient follow up with cardiology 24 hrs before patient admitted this time, diuretic therapy was increased and plan for further work up cardiac catheterization and transesophageal echocardiogram. On his initial physical examination his blood pressure was 106/95, HR 107, RR 22, 02 saturation 97%, positive JVD, heart with S1 and S2 present and tachycardic, lungs with no wheezing or rales, abdomen not distended and positive lower extremity edema.  ? ?Na 136, K 5.0, Cl 102, bicarbonate at 25, glucose 91 bun 18, cr 1,42 ?BNP >4,500 ?Wbc 4.3, hgb 12.9 hct 41.1 plt 104 ? ?Chest radiograph with cardiomegaly, positive hilar vascular congestion and fluid in the right fissure.  ? ?EKG 93 bpm, normal axis, qtc 509, right bundle branch block, sinus rhythm with PVC, no significant ST segment or T wave changes.  ? ?CT chest with subsegmental and segmental pulmonary artery fillings in the right lower lobe. Small bilateral pleural effusions,  ?Aneurysmal dilatation of the ascending aorta.  ? ?Patient was placed on furosemide for diuresis, and anticoagulation with apixaban.  ?Further work up with right and left cardiac catheterization plus TEE in preparation for possible MitraClip.  ? ? ?Assessment and Plan: ?* Acute pulmonary embolism  ([place]) ?Oxygenating at 99% on room air.  ?No chest pain. ?Transitioned to oral apixaban with good toleration.  ? ?Acute on chronic congestive heart failure ([place]) ?Echocardiogram with LV systolic function reduced to 25%, with global hypokinesis. RV systolic function is preserved. RV size with severe dilatation, RVSP 35,6 mmHg, severe dilatation of right and left atrium. Severe mitral regurgitation.  ? ?Urine output is 1,450 ml over last 24 hrs, since   ?Blood pressure 79 to 98 mmHg systolic.  ? ?Continue with midodrine for blood pressure support. ?On spironolactone.  ?Today had a bolus NS 250 ml.  ?Continue blood pressure monitoring.   ? ?For non sustained ventricular tachycardia and frequent PVC patient has been placed on amiodarone and today added mexiletine.  ? ? ? ? ?Chronic kidney disease, stage 3a ([place]) ?Hyponatremia. Hypomagnesemia.  ? ?Renal function with serum cr at 1.50 with K at 5,1 and Na 131. ?Plan to continue spironolactone and close follow up renal function and electrolytes.  ?Continue with midodrine for blood pressure support.  ? ?Dyslipidemia ?Continue with statin therapy.  ? ?Ascending aortic aneurysm ([place]) ?CT showing aneurysmal dilation of ascending aorta measuring 4.5 cm.  ?-Radiologist recommending semi-annual imaging followup by CTA or MRA ?-Outpatient cardiothoracic surgery follow-up ? ? ? ? ?  ? ?Subjective: patient with constipation today, no chest pain or dyspnea  ? ?Physical Exam: ?Vitals:  ? 05/19/21 0757 05/19/21 0820 05/19/21 1012 05/19/21 1226  ?BP:  93/65 (!) 79/60 105/78  ?Pulse: (!) 117 (!) 121  100  ?Resp: 15 20  20   ?Temp:  98.4 ?F (36.9 ?C)  (!) 97.2 ?F (36.2 ?C)  ?TempSrc:  Oral  Oral  ?SpO2:  90% 98%  100%  ?Weight:      ?Height:      ? ?Neurology awake and alert ?ENT with no pallor ?Cardiovascular with S1 and S2 present and rhythmic, no gallops, positive murmur at the apex. ?No JVD ?Trace lower extremity edema ?Respiratory with no wheezing ?Abdomen soft and non tender ?Data  Reviewed: ? ? ? ?Family Communication: no family at the bedside  ? ?Disposition: ?Status is: Inpatient ?Remains inpatient appropriate because: started on mexiletine need telemetry monitoring   ? Planned Discharge Destination: Home ? ? ? ?Author: ?Tawni Millers, MD ?05/19/2021 3:39 PM ? ?For on call review [email or web address].  ?

## 2021-05-19 NOTE — Progress Notes (Signed)
Critical result -  PTT >200. Matthew [name], [facility] notified. Heparin gtt d/c'd. ?

## 2021-05-19 NOTE — Progress Notes (Addendum)
? ? Advanced Heart Failure Rounding Note ? ?PCP-Cardiologist: Larae Grooms, MD  ? ?Subjective:   ? ?RHC yesterday showed low filling pressures. Gentle IVF hydration given. Denies dyspnea. Daily wts not accurate.  ? ?C/w frequent PVCs, as high as 40/min earlier this morning.  ?K 5.1 ?Mg 2.1 ? ?Scr 1.43>>1.50 ?BUN 19>>25   ? ?Sinus tach, 110s. SBPs 90s.  ? ? ?TEE:  Mild restriction of P2. Severe central/posterior MR due to P2 restriction and annular dilation. EF 15-20%, LV severely dilated w/ severe HK. RV dilated + severe HK. Mod TR  ? ?R/LHCAssessment: ?1. Mild non-obstructive CAD ?2. NICM EF < 20% ?3. Low filling pressures with normal cardiac output ?4. No significant v-waves in PCWP tracing despite presence of severe MR on TEE (may be influenced by low BP and filling pressures) ? ?Ao = 84/60 (70) ?LV = 83/6 ?RA = 1 ?RV = 20/5 ?PA = 21/7 (14) ?PCW = 3 (no prominent v waves) ?Fick cardiac output/index = 6.1/3.0 ?PVR = 1.8 WU ?Ao sat = 96% ?PA sat = 71%, 74% ? ? ?Objective:   ?Weight Range: ?87.6 kg ?Body mass index is 21.76 kg/m?.  ? ?Vital Signs:   ?Temp:  [97.1 ?F (36.2 ?C)-97.9 ?F (36.6 ?C)] 97.9 ?F (36.6 ?C) (04/06 0353) ?Pulse Rate:  [56-118] 109 (04/06 0353) ?Resp:  [17-28] 20 (04/06 0353) ?BP: (90-122)/(61-93) 96/62 (04/06 0353) ?SpO2:  [94 %-100 %] 99 % (04/06 0353) ?Weight:  [87.6 kg] 87.6 kg (04/06 0353) ?Last BM Date :  (UTA) ? ?Weight change: ?Filed Weights  ? 05/17/21 0438 05/18/21 0252 05/19/21 0353  ?Weight: 90.2 kg 72.5 kg 87.6 kg  ? ? ?Intake/Output:  ? ?Intake/Output Summary (Last 24 hours) at 05/19/2021 0820 ?Last data filed at 05/19/2021 0500 ?Gross per 24 hour  ?Intake 1923.26 ml  ?Output 1450 ml  ?Net 473.26 ml  ?  ? ? ?Physical Exam  ? ?General:  elderly male, laying in bed. No distress. No respiratory difficulty ?HEENT: normal ?Neck: supple. no JVD. Carotids 2+ bilat; no bruits. No lymphadenopathy or thyromegaly appreciated. ?Cor: PMI nondisplaced. Regular rhythm, tachy rate. 3/6 MR + TR  murmur ?Lungs: clear ?Abdomen: soft, nontender, nondistended. No hepatosplenomegaly. No bruits or masses. Good bowel sounds. ?Extremities: no cyanosis, clubbing, rash, edema ?Neuro: alert & oriented x 3, cranial nerves grossly intact. moves all 4 extremities w/o difficulty. Affect pleasant. ? ? ?Telemetry  ? ?Sinus tach, 110s, frequent PVCs, as high as 40/min Personally reviewed ? ?EKG  ?  ?No new EKG to review  ? ?Labs  ?  ?CBC ?Recent Labs  ?  05/18/21 ?0448 05/18/21 ?1056 05/18/21 ?1107 05/19/21 ?0606  ?WBC 5.8  --   --  4.9  ?HGB 13.8   < > 12.9* 12.9*  ?HCT 42.1   < > 38.0* 41.2  ?MCV 78.1*  --   --  79.8*  ?PLT 166  --   --  178  ? < > = values in this interval not displayed.  ? ?Basic Metabolic Panel ?Recent Labs  ?  05/17/21 ?0416 05/18/21 ?0448 05/18/21 ?1056 05/18/21 ?1107 05/19/21 ?0606  ?NA 136  --    < > 140 131*  ?K 4.8  --    < > 4.0 5.1  ?CL 97*  --   --   --  99  ?CO2 31  --   --   --  28  ?GLUCOSE 88  --   --   --  83  ?BUN 19  --   --   --  25*  ?CREATININE 1.43*  --   --   --  1.50*  ?CALCIUM 9.1  --   --   --  8.6*  ?MG 2.0 2.1  --   --  2.1  ? < > = values in this interval not displayed.  ? ?Liver Function Tests ?No results for input(s): AST, ALT, ALKPHOS, BILITOT, PROT, ALBUMIN in the last 72 hours. ?No results for input(s): LIPASE, AMYLASE in the last 72 hours. ?Cardiac Enzymes ?No results for input(s): CKTOTAL, CKMB, CKMBINDEX, TROPONINI in the last 72 hours. ? ?BNP: ?BNP (last 3 results) ?Recent Labs  ?  05/09/21 ?[id number] 05/10/21 ?0205 05/13/21 ?2142  ?BNP >4,500.0* >4,500.0* >4,500.0*  ? ? ?ProBNP (last 3 results) ?No results for input(s): PROBNP in the last 8760 hours. ? ? ?D-Dimer ?No results for input(s): DDIMER in the last 72 hours. ?Hemoglobin A1C ?No results for input(s): HGBA1C in the last 72 hours. ?Fasting Lipid Panel ?No results for input(s): CHOL, HDL, LDLCALC, TRIG, CHOLHDL, LDLDIRECT in the last 72 hours. ?Thyroid Function Tests ?No results for input(s): TSH, T4TOTAL, T3FREE,  THYROIDAB in the last 72 hours. ? ?Invalid input(s): FREET3 ? ?Other results: ? ? ?Imaging  ? ? ?CARDIAC CATHETERIZATION ? ?Result Date: 05/18/2021 ?  Prox RCA lesion is 30% stenosed.   Prox Cx lesion is 30% stenosed.   Prox LAD to Mid LAD lesion is 30% stenosed.   The left ventricular ejection fraction is less than 25% by visual estimate. Findings: Ao = 84/60 (70) LV = 83/6 RA = 1 RV = 20/5 PA = 21/7 (14) PCW = 3 (no prominent v waves) Fick cardiac output/index = 6.1/3.0 PVR = 1.8 WU Ao sat = 96% PA sat = 71%, 74% Assessment: 1. Mild non-obstructive CAD 2. NICM EF < 20% 3. Low filling pressures with normal cardiac output 4. No significant v-waves in PCWP tracing despite presence of severe MR on TEE (may be influenced by low BP and filling pressures) Plan/Discussion: He has severe NICM. ? Due to PVCs. I do not think he is candidate for MitraClip currently. Will hydrate gently and push GDMT. I suspect PVCs may be contributing to LV dysfunction - will need to suppress more fully. Glori Bickers, MD 12:32 PM  ? ? ?Medications:   ? ? ?Scheduled Medications: ? amiodarone  200 mg Oral BID  ? atorvastatin  10 mg Oral Daily  ? Chlorhexidine Gluconate Cloth  6 each Topical Daily  ? leptospermum manuka honey  1 application. Topical Daily  ? magnesium oxide  400 mg Oral Daily  ? midodrine  5 mg Oral TID WC  ? sodium chloride flush  3 mL Intravenous Q12H  ? sodium chloride flush  3 mL Intravenous Q12H  ? spironolactone  12.5 mg Oral Daily  ? ? ?Infusions: ? sodium chloride    ? heparin 1,800 Units/hr (05/18/21 2247)  ? ? ?PRN Medications: ?sodium chloride, acetaminophen, ondansetron (ZOFRAN) IV, sodium chloride flush, sodium chloride flush ? ? ? ?Patient Profile  ? ?81 y.o. male with history of chronic biventricular HF, NICM, CAD, severe MR/TR, HTN, obesity and multiple recent admissions, now admitted w/ acute PE and a/c CHF.  ? ?Echo this admission, EF 25%, mildly dilated RV with moderately decreased systolic function, severe  MR and TR. IVC dilated.  ? ?Venous dopplers negative for DVT  ? ?Assessment/Plan  ? ?1. Acute on chronic Biventricular HF   ?- ECHO 04/2020 EF 25-30% with mild MR.   ?- Echo 04/04/21 EF LVEF 20-25%,  RV moderately reduced, LA/RA severely dilated, and severe MR.  ?- Echo per Dr. Aundra Dubin read: EF 25%, LV severely dilated, heavy trabeculations with no LV thrombus, RV mildly dilated, RV moderately reduced, severe BAE, severe MR, severe TR, dilated IVC with estimated RAP 15 mmHg ?- R/LHC this admit w/ mild non-obstructive CAD, NICM EF < 20%, Low filling pressures with normal cardiac output ?- suspect PVCs may be contributing to LV dysfunction, will need to suppress more fully. ?- dry on exam. Low filling pressures on cath yesterday. Tachycardic and soft BP, will give additional IVFs today ?- hold diuretics   ?- GDMT limited d/t hypotension. Continue midodrine 5 mg TID ?- Hold spiro today w/ low volume status and borderline high K (5.1)  ? ?  ?2. CAD ?- Cath 2012- severe stenosis of the diagonal ?- Cath this admit w/ mild non-obstructive CAD ?- Continue statin ?  ?3. Mitral Regurgitation ?- Severe on echo this admit and last echo 02/23 ?- TEE showed mild restriction of P2. Severe central/posterior MR due to P2 restriction and annular dilation ?- functional, severely dilated LA and LV  ?- he is not a candidate for MitraClip currently ?- HF optimization per above  ? ?  ?4. Tricuspid Regurgitation  ?- moderate on TEE  ?- functional, severely dilated RA and RV   ?- HF optimization per above  ?  ?5. Acute PE ?- Remains on heparin. Procedures complete, switch back to Eliquis today  ?- No evidence of DVT on [place] ?- ? 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  ?6. Hyperlipidemia ?- Continue  statin ?  ?7. CKD IIIa ?- Scr 1.43, baseline around 1.4-1.7 last few months ?- Stable w/ diuresis SCr 1.5 today  ?- follow BMP  ?  ?8. NSVT/PVCs ?- 21 beat run NSVT on 04/02 + frequent PVCs  ?- Amio 200 mg  BID started but still frequent PVCs ?- suspect contributing to CM  ?- Add mexiletine for better suppression  ?- Keep K > 4 and Mag > 2 ? ?9. Right LE wounds ?- occurred after bumping leg on furniture a fe

## 2021-05-19 NOTE — Progress Notes (Signed)
Physical Therapy Treatment ?Patient Details ?Name: Matthew [name] ?MRN: [id number] ?DOB: 1940/03/18 ?Today's Date: 05/19/2021 ? ? ?History of Present Illness 81 y.o. male adm 3/31 with SOB with RLL PE treated with Heparin> Eliquis. Admission 3/25-28/2023 with AMS and UTI. L radial heart cath completed on 4/5. PMHx: CAD, NICM, CHF, HTN, OA, obesity ? ?  ?PT Comments  ? ? Pt demos increased motivation to participate with PT today and denies any dizziness despite low BP.  Pt demos fair compliance with L radial cath precautions, needing intermittent VC's during activity for safety.  Pt demos inc difficulty completing sit > stand during tx session secondary to new precautions/generalized weakness and activity is limited to transfers only at this time secondary to pt fatigue.  Pt expresses desire to receive more therapy prior to return home and is encouraged to practice functional mobility with [facility] staff.  ?Recommendations for follow up therapy are one component of a multi-disciplinary discharge planning process, led by the attending physician.  Recommendations may be updated based on patient status, additional functional criteria and insurance authorization. ? ?Follow Up Recommendations ? Home health PT ?  ?  ?Assistance Recommended at Discharge Intermittent Supervision/Assistance  ?Patient can return home with the following A little help with walking and/or transfers;A little help with bathing/dressing/bathroom;Assistance with cooking/housework;Assist for transportation;Help with stairs or ramp for entrance ?  ?Equipment Recommendations ? BSC/3in1  ?  ?Recommendations for Other Services   ? ? ?  ?Precautions / Restrictions Precautions ?Precautions: Other (comment) ?Precaution Comments: L radial heart cath on 4/5 ?Restrictions ?Weight Bearing Restrictions: No  ?  ? ?Mobility ? Bed Mobility ?Overal bed mobility: Needs Assistance ?Bed Mobility: Supine to Sit ?  ?  ?Supine to sit: Supervision ?  ?  ?General bed mobility  comments: Pt is educated on precautions for L arm secondary to recent cardiac cath.  Demos understanding.  Able to transfer to EOB with supervision and VCs not to use L UE to push/pull during activity.  Demos good sitting balance at EOB. ?Patient Response: Cooperative ? ?Transfers ?Overall transfer level: Needs assistance ?Equipment used: Rolling Holle (2 wheels) ?Transfers: Sit to/from Stand, Bed to chair/wheelchair/BSC ?Sit to Stand: Min assist ?  ?Step pivot transfers: Min guard ?  ?  ?  ?General transfer comment: Pt. performs sit > stand from elevated surface height with use of min A to complete transfer.  Requires 3 attempts and use of rocking to build momentum in order to complete activity.  Pt demos forward flexed posture in standing with difficulty correcting.  Per pt, he has tendency to lean forward at baseline due to chronic LB issues.  BP responds normally to activity but pt demos fair balance and fatigue with activity limiting tx to transfer up to chair only.  Pt educated to spend more time up in chair and to practice transfer with NSG to improve strength and endurance in preparation for gait training.  Demos understanding.  One final BP is taken when pt returns to sitting in chair: 98/76. ?  ? ?Ambulation/Gait ?  ?  ?  ?  ?  ?  ?  ?  ? ? ?Stairs ?  ?  ?  ?  ?  ? ? ?Wheelchair Mobility ?  ? ?Modified Rankin (Stroke Patients Only) ?  ? ? ?  ?Balance Overall balance assessment: Needs assistance ?Sitting-balance support: Feet supported ?Sitting balance-[name] Scale: Good ?  ?  ?Standing balance support: Bilateral upper extremity supported, During functional activity ?Standing balance-[name] Scale:  Fair ?  ?  ?  ?  ?  ?  ?  ?  ?  ?  ?  ?  ?  ? ?  ?Cognition Arousal/Alertness: Awake/alert ?Behavior During Therapy: [facility] for tasks assessed/performed ?Overall Cognitive Status: Within Functional Limits for tasks assessed ?  ?  ?  ?  ?  ?  ?  ?  ?  ?  ?  ?  ?  ?  ?  ?  ?General Comments: Pt. is supine in bed when PT  arrives.  Denies any dizziness despite low BP.  NSG states that BP has been low all day and that it is okay to work with patient.  Pt agreeable to try to get OOB, wants to go home. ?  ?  ? ?  ?Exercises   ? ?  ?General Comments General comments (skin integrity, edema, etc.): Pt. understands that he is requiring inc assist compared to baseline function, however, prefers to return home and is confident that his family will be able to assist with his needs and provide 24/7 support. ?  ?  ? ?Pertinent Vitals/Pain Pain Assessment ?Pain Assessment: 0-10 ?Pain Score: 0-No pain  ? ? ?Home Living   ?  ?  ?  ?  ?  ?  ?  ?  ?  ?   ?  ?Prior Function    ?  ?  ?   ? ?PT Goals (current goals can now be found in the care plan section) Progress towards PT goals: Progressing toward goals ? ?  ?Frequency ? ? ? Min 3X/week ? ? ? ?  ?PT Plan Current plan remains appropriate  ? ? ?Co-evaluation   ?  ?  ?  ?  ? ?  ?AM-PAC PT "6 Clicks" Mobility   ?Outcome Measure ? Help needed turning from your back to your side while in a flat bed without using bedrails?: None ?Help needed moving from lying on your back to sitting on the side of a flat bed without using bedrails?: None ?Help needed moving to and from a bed to a chair (including a wheelchair)?: A Little ?Help needed standing up from a chair using your arms (e.g., wheelchair or bedside chair)?: A Little ?Help needed to walk in hospital room?: A Little ?Help needed climbing 3-5 steps with a railing? : A Lot ?6 Click Score: 19 ? ?  ?End of Session Equipment Utilized During Treatment: Gait belt ?Activity Tolerance: Patient tolerated treatment well;Patient limited by fatigue ?Patient left: in chair;with call bell/phone within reach;with chair alarm set ?Nurse Communication: Mobility status ?  ?  ? ? ?Time: [phone] ?PT Time Calculation (min) (ACUTE ONLY): 28 min ? ?Charges:  $Therapeutic Activity: 23-37 mins          ?          ? ?Krish Bailly A. Axel Frisk, PT, DPT ?Acute Rehabilitation  Services ?Office: [phone]  ? ? ?[place] ?05/19/2021, 10:23 AM ? ?

## 2021-05-19 NOTE — TOC Benefit Eligibility Note (Signed)
Patient Advocate Encounter ? ?Insurance verification completed.   ? ?The patient is currently admitted and upon discharge could be taking mexiletine (Mexitil) 200 mg capsules. ? ?The current 30 day co-pay is, $16.00.  ? ?The patient is insured through [employer]  ? ? ? ?Lyndel Safe, CPhT ?Pharmacy Patient Advocate Specialist ?[facility] Patient Advocate Team ?Direct Number: [phone]  Fax: [phone] ? ? ? ? ? ?  ?

## 2021-05-20 ENCOUNTER — Inpatient Hospital Stay (HOSPITAL_COMMUNITY): Payer: Medicare Other

## 2021-05-20 DIAGNOSIS — I5023 Acute on chronic systolic (congestive) heart failure: Secondary | ICD-10-CM | POA: Diagnosis not present

## 2021-05-20 DIAGNOSIS — I509 Heart failure, unspecified: Secondary | ICD-10-CM

## 2021-05-20 DIAGNOSIS — E785 Hyperlipidemia, unspecified: Secondary | ICD-10-CM | POA: Diagnosis not present

## 2021-05-20 DIAGNOSIS — I493 Ventricular premature depolarization: Secondary | ICD-10-CM | POA: Diagnosis not present

## 2021-05-20 DIAGNOSIS — I2699 Other pulmonary embolism without acute cor pulmonale: Secondary | ICD-10-CM | POA: Diagnosis not present

## 2021-05-20 DIAGNOSIS — N1831 Chronic kidney disease, stage 3a: Secondary | ICD-10-CM | POA: Diagnosis not present

## 2021-05-20 LAB — COOXEMETRY PANEL
Carboxyhemoglobin: 1.9 % — ABNORMAL HIGH (ref 0.5–1.5)
Methemoglobin: 1 % (ref 0.0–1.5)
O2 Saturation: 78.9 %
Total hemoglobin: 12.4 g/dL (ref 12.0–16.0)

## 2021-05-20 LAB — ECHO TEE
MV M vel: 4.81 m/s
MV Peak grad: 92.5 mmHg
Radius: 0.75 cm

## 2021-05-20 LAB — BASIC METABOLIC PANEL
Anion gap: 6 (ref 5–15)
BUN: 22 mg/dL (ref 8–23)
CO2: 26 mmol/L (ref 22–32)
Calcium: 8.8 mg/dL — ABNORMAL LOW (ref 8.9–10.3)
Chloride: 99 mmol/L (ref 98–111)
Creatinine, Ser: 1.41 mg/dL — ABNORMAL HIGH (ref 0.61–1.24)
GFR, Estimated: 50 mL/min — ABNORMAL LOW (ref >60)
Glucose, Bld: 84 mg/dL (ref 70–99)
Potassium: 4.9 mmol/L (ref 3.5–5.1)
Sodium: 131 mmol/L — ABNORMAL LOW (ref 135–145)

## 2021-05-20 LAB — MAGNESIUM: Magnesium: 2.1 mg/dL (ref 1.7–2.4)

## 2021-05-20 IMAGING — MR MR CARD MORPHOLOGY WO/W CM
45 of 48 series · 45 of 48 positions shown · IV contrast (gadavist)
Comparison: none

CLINICAL DATA: 80M with HFrEF (EF 25%), nonobstructive CAD on cath

EXAM:
CARDIAC MRI
TECHNIQUE: The patient was scanned on a 1.5 Tesla Siemens magnet. A dedicated
cardiac coil was used. Functional imaging was done using Fiesta
sequences. [DATE], and 4 chamber views were done to assess for RWMA's.
Modified CEMALI rule using a short axis stack was used to
calculate an ejection fraction on a dedicated work station using
Circle software. The patient received 10 cc of Gadavist. After 10
minutes inversion recovery sequences were used to assess for
infiltration and scar tissue.
CONTRAST:  10 cc  of Gadavist

[Series 4: t2_haste_db_tra_bh · axial · 8.0mm · 1.41mm/px · 1 of 21 slices shown]
[im 1/21]
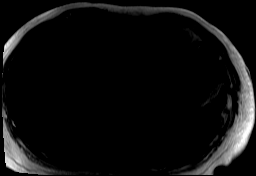

[Series 8: bSSFP · sagittal · 8.0mm · 1.61mm/px · 1 of 25 slices shown (1 of 23)]
[im 1/25]
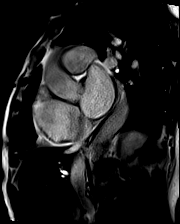

[Series 9: bSSFP · sagittal · 8.0mm · 1.61mm/px · 1 of 25 slices shown (2 of 23)]
[im 1/25]
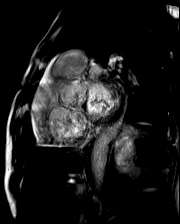

[Series 10: bSSFP · sagittal · 8.0mm · 1.61mm/px · 1 of 25 slices shown (3 of 23)]
[im 1/25]
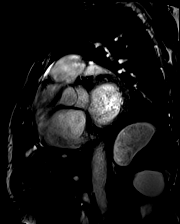

[Series 11: bSSFP · sagittal · 8.0mm · 1.61mm/px · 1 of 25 slices shown (4 of 23)]
[im 1/25]
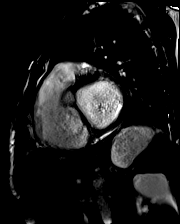

[Series 12: bSSFP · sagittal · 8.0mm · 1.61mm/px · 1 of 25 slices shown (5 of 23)]
[im 1/25]
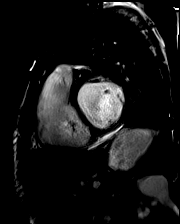

[Series 13: bSSFP · sagittal · 8.0mm · 1.61mm/px · 1 of 25 slices shown (6 of 23)]
[im 1/25]
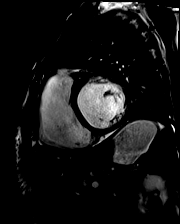

[Series 14: bSSFP · sagittal · 8.0mm · 1.61mm/px · 1 of 25 slices shown (7 of 23)]
[im 1/25]
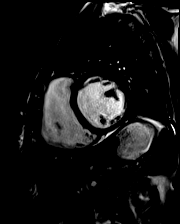

[Series 15: bSSFP · sagittal · 8.0mm · 1.61mm/px · 1 of 25 slices shown (8 of 23)]
[im 1/25]
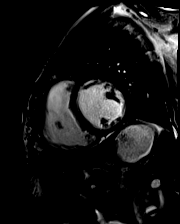

[Series 16: bSSFP · sagittal · 8.0mm · 1.61mm/px · 1 of 25 slices shown (9 of 23)]
[im 1/25]
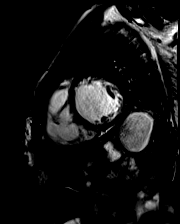

[Series 17: bSSFP · sagittal · 8.0mm · 1.61mm/px · 1 of 25 slices shown (10 of 23)]
[im 1/25]
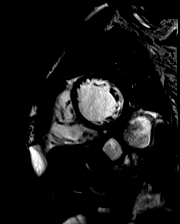

[Series 18: bSSFP · sagittal · 8.0mm · 1.61mm/px · 1 of 25 slices shown (11 of 23)]
[im 1/25]
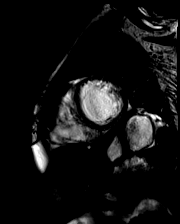

[Series 19: bSSFP · sagittal · 8.0mm · 1.61mm/px · 1 of 25 slices shown (12 of 23)]
[im 1/25]
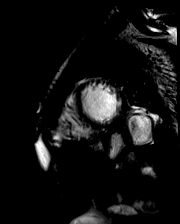

[Series 20: bSSFP · sagittal · 8.0mm · 1.61mm/px · 1 of 25 slices shown (13 of 23)]
[im 1/25]
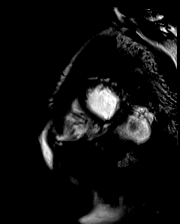

[Series 21: bSSFP · sagittal · 8.0mm · 1.61mm/px · 1 of 25 slices shown (14 of 23)]
[im 1/25]
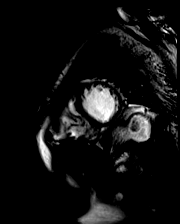

[Series 22: bSSFP · sagittal · 8.0mm · 1.61mm/px · 1 of 25 slices shown (15 of 23)]
[im 1/25]
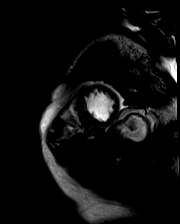

[Series 23: bSSFP · sagittal · 8.0mm · 1.61mm/px · 1 of 25 slices shown (16 of 23)]
[im 1/25]
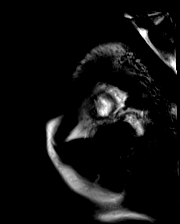

[Series 24: bSSFP · sagittal · 8.0mm · 1.61mm/px · 1 of 25 slices shown (17 of 23)]
[im 1/25]
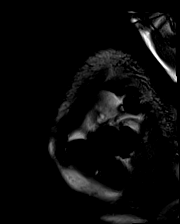

[Series 25: bSSFP · sagittal · 8.0mm · 1.61mm/px · 1 of 25 slices shown (18 of 23)]
[im 1/25]
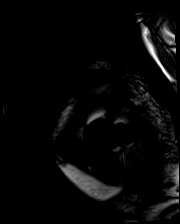

[Series 26: bSSFP · axial · 6.0mm · 1.41mm/px · 1 of 25 slices shown (19 of 23)]
[im 1/25]
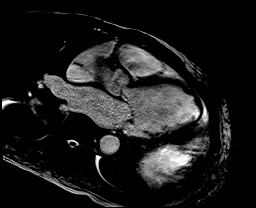

[Series 27: bSSFP · oblique · 6.0mm · 1.41mm/px · 1 of 25 slices shown (20 of 23)]
[im 1/25]
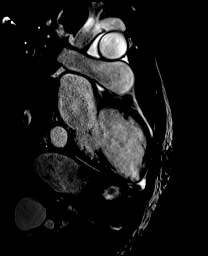

[Series 28: bSSFP · axial · 6.0mm · 1.41mm/px · 1 of 25 slices shown (21 of 23)]
[im 1/25]
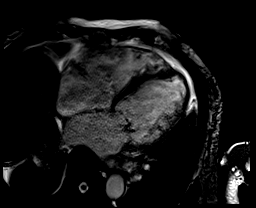

[Series 29: (id)_long_t1 · sagittal · 8.0mm · 1.56mm/px · 1 of 24 slices shown]
[im 1/24]
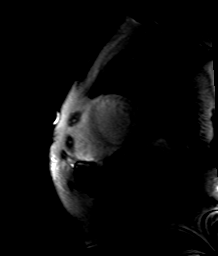

[Series 30: (id)_long_t1_moco · sagittal · 8.0mm · 1.56mm/px · 1 of 24 slices shown]
[im 1/24]
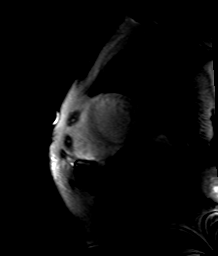

[Series 33: (id)_trufi · sagittal · 8.0mm · 2.08mm/px · 1 of 9 slices shown]
[im 1/9]
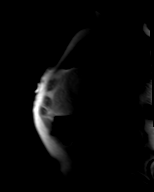

[Series 34: (id)_trufi_moco · sagittal · 8.0mm · 2.08mm/px · 1 of 9 slices shown]
[im 1/9]
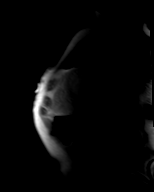

[Series 37: bSSFP · axial · 6.0mm · 1.41mm/px · 1 of 25 slices shown (22 of 23)]
[im 1/25]
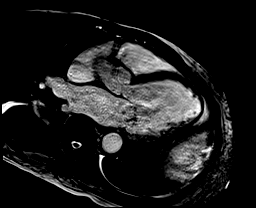

[Series 38: bSSFP · axial · 6.0mm · 1.41mm/px · 1 of 25 slices shown (23 of 23)]
[im 1/25]
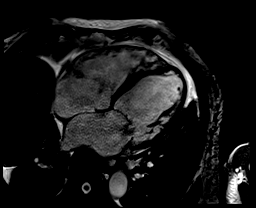

[Series 39: cine_trufi_short axis_cs_2_shot · sagittal · 8.0mm · 1.48mm/px · 1 of 25 slices shown (1 of 17)]
[im 1/25]
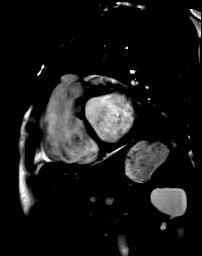

[Series 39: cine_trufi_short axis_cs_2_shot · sagittal · 8.0mm · 1.48mm/px · 1 of 25 slices shown (2 of 17)]
[im 1/25]
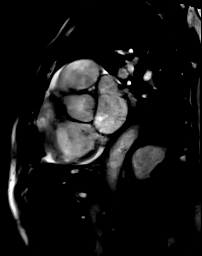

[Series 39: cine_trufi_short axis_cs_2_shot · sagittal · 8.0mm · 1.48mm/px · 1 of 25 slices shown (3 of 17)]
[im 1/25]
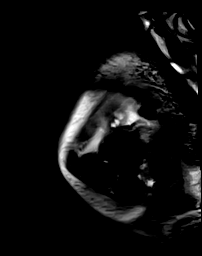

[Series 39: cine_trufi_short axis_cs_2_shot · sagittal · 8.0mm · 1.48mm/px · 1 of 25 slices shown (4 of 17)]
[im 1/25]
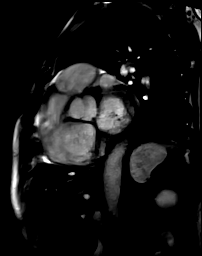

[Series 39: cine_trufi_short axis_cs_2_shot · sagittal · 8.0mm · 1.48mm/px · 1 of 25 slices shown (5 of 17)]
[im 1/25]
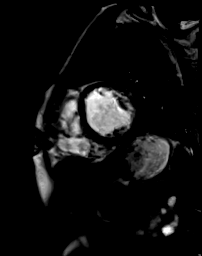

[Series 39: cine_trufi_short axis_cs_2_shot · sagittal · 8.0mm · 1.48mm/px · 1 of 25 slices shown (6 of 17)]
[im 1/25]
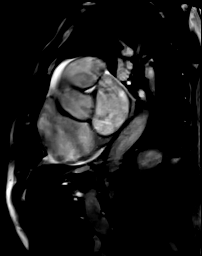

[Series 39: cine_trufi_short axis_cs_2_shot · sagittal · 8.0mm · 1.48mm/px · 1 of 25 slices shown (7 of 17)]
[im 1/25]
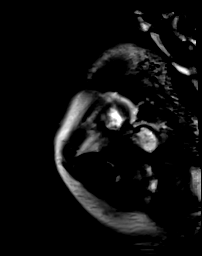

[Series 39: cine_trufi_short axis_cs_2_shot · sagittal · 8.0mm · 1.48mm/px · 1 of 25 slices shown (8 of 17)]
[im 1/25]
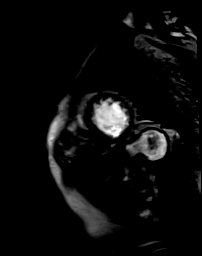

[Series 39: cine_trufi_short axis_cs_2_shot · sagittal · 8.0mm · 1.48mm/px · 1 of 25 slices shown (9 of 17)]
[im 1/25]
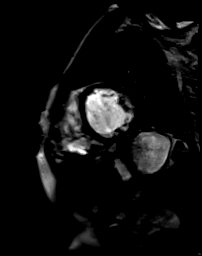

[Series 39: cine_trufi_short axis_cs_2_shot · sagittal · 8.0mm · 1.48mm/px · 1 of 25 slices shown (10 of 17)]
[im 1/25]
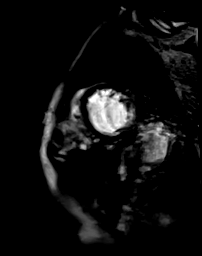

[Series 39: cine_trufi_short axis_cs_2_shot · sagittal · 8.0mm · 1.48mm/px · 1 of 25 slices shown (11 of 17)]
[im 1/25]
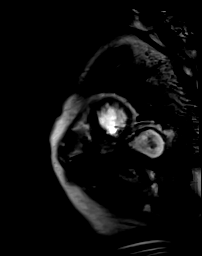

[Series 39: cine_trufi_short axis_cs_2_shot · sagittal · 8.0mm · 1.48mm/px · 1 of 25 slices shown (12 of 17)]
[im 1/25]
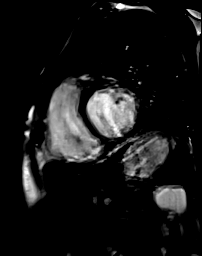

[Series 39: cine_trufi_short axis_cs_2_shot · sagittal · 8.0mm · 1.48mm/px · 1 of 25 slices shown (13 of 17)]
[im 1/25]
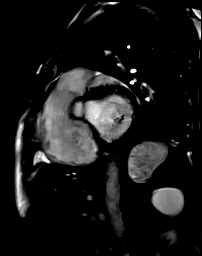

[Series 39: cine_trufi_short axis_cs_2_shot · sagittal · 8.0mm · 1.48mm/px · 1 of 25 slices shown (14 of 17)]
[im 1/25]
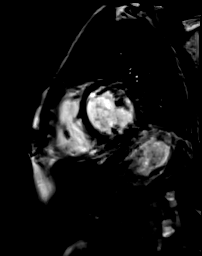

[Series 39: cine_trufi_short axis_cs_2_shot · sagittal · 8.0mm · 1.48mm/px · 1 of 25 slices shown (15 of 17)]
[im 1/25]
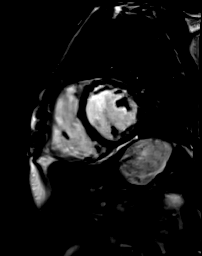

[Series 39: cine_trufi_short axis_cs_2_shot · sagittal · 8.0mm · 1.48mm/px · 1 of 25 slices shown (16 of 17)]
[im 1/25]
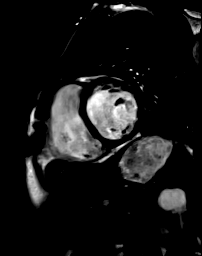

[Series 39: cine_trufi_short axis_cs_2_shot · sagittal · 8.0mm · 1.48mm/px · 1 of 25 slices shown (17 of 17)]
[im 1/25]
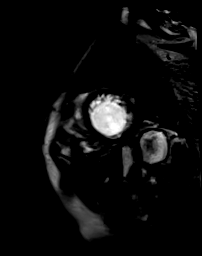

[45 of 48 positions shown; findings below may reference images not displayed]

FINDINGS: Left ventricle:

-Unable to quantify volumes due to significant motion artifact on
cine images likely from frequent PVCs, but visually LV is severely
dilated with severe systolic dysfunction

-Elevated ECV (37%)

-Basal septal midwall LGE

-RV insertion site LGE

Right ventricle: visually RV is severely dilated with moderate
systolic dysfunction

Left atrium: Severe enlargement

Right atrium: Severe enlargement

Mitral valve: Moderate to severe regurgitation (was not quantified)

Aortic valve: Trivial regurgitation

Tricuspid valve: Moderate to severe regurgitation (was not
quantified)

Pulmonic valve: Mild regurgitation

Aorta: ascending aortic aneurysm measuring 45mm

Pericardium: Trivial effusion
IMPRESSION: 1. Limited study as patient refused to continue shortly after
contrast administration. Single shot LGE images and post contrast T1
maps were obtained, but high resolution LGE images and flow
quantification to assess mitral/tricuspid regurgitation were not
done

2. Unable to quantify volumes due to significant motion artifact on
cine images likely from frequent PVCs, but visually LV is severely
dilated with severe systolic dysfunction

3. Visually RV is severely dilated with moderate systolic
dysfunction

4. Basal septal midwall late gadolinium enhancement, which is a scar
pattern seen in nonischemic cardiomyopathies and associated with
worse prognosis

5. RV insertion site LGE, which is a nonspecific finding often seen
in setting of elevated pulmonary pressures

6.  Ascending aortic aneurysm measuring 45mm

## 2021-05-20 MED ORDER — GADOBUTROL 1 MMOL/ML IV SOLN
10.0000 mL | Freq: Once | INTRAVENOUS | Status: AC | PRN
  Administered 2021-05-20: 10 mL via INTRAVENOUS

## 2021-05-20 MED ORDER — AMIODARONE LOAD VIA INFUSION
150.0000 mg | Freq: Once | INTRAVENOUS | Status: AC
  Administered 2021-05-20: 150 mg via INTRAVENOUS
  Filled 2021-05-20: qty 83.34

## 2021-05-20 MED ORDER — AMIODARONE HCL IN DEXTROSE 360-4.14 MG/200ML-% IV SOLN
60.0000 mg/h | INTRAVENOUS | Status: AC
  Administered 2021-05-20: 60 mg/h via INTRAVENOUS
  Filled 2021-05-20: qty 200

## 2021-05-20 MED ORDER — FUROSEMIDE 10 MG/ML IJ SOLN
40.0000 mg | Freq: Once | INTRAMUSCULAR | Status: AC
  Administered 2021-05-20: 40 mg via INTRAVENOUS
  Filled 2021-05-20: qty 4

## 2021-05-20 MED ORDER — AMIODARONE HCL IN DEXTROSE 360-4.14 MG/200ML-% IV SOLN
30.0000 mg/h | INTRAVENOUS | Status: DC
  Administered 2021-05-20 – 2021-05-23 (×4): 30 mg/h via INTRAVENOUS
  Filled 2021-05-20 (×4): qty 200

## 2021-05-20 MED FILL — Verapamil HCl IV Soln 2.5 MG/ML: INTRAVENOUS | Qty: 2 | Status: AC

## 2021-05-20 MED FILL — Lidocaine HCl Local Preservative Free (PF) Inj 1%: INTRAMUSCULAR | Qty: 30 | Status: AC

## 2021-05-20 NOTE — Progress Notes (Signed)
Mobility Specialist Progress Note ? ? 05/20/21 1644  ?Mobility  ?Activity Refused mobility  ? ?After max encouragement pt refused mobility stating " I am having trouble breathing, I don't want to move". Will f/u if PT/OT recommends going forward.  ? ?Frederico Hamman ?Mobility Specialist ?Phone Number [phone] ? ?

## 2021-05-20 NOTE — Progress Notes (Signed)
PT Cancellation Note ? ?Patient Details ?Name: Matthew [name] ?MRN: [id number] ?DOB: [birth year] ? ? ?Cancelled Treatment:    Reason Eval/Treat Not Completed: (P) Patient declined, no reason specified (Pt states "I'm not getting up." despite max encouragement and education about benefits of mobility and risks of immobility.) RN present and aware. Will continue efforts per PT POC as schedule permits. ? ? ?Dorathy Kinsman Maclane Holloran ?05/20/2021, 3:49 PM ? ? ?

## 2021-05-20 NOTE — Progress Notes (Addendum)
? ? Advanced Heart Failure Rounding Note ? ?PCP-Cardiologist: Lance Muss, MD  ? ?Subjective:   ? ?04/05: RHC Low filling pressures with normal CO.  ?04/06: Seen by structural team for consideration of mitral TEER ? ?Scr stable 1.41, K 4.9 and mag 2.1. ? ?SBP soft, 90s - low 100s. ? ?Feeling okay. PT limited, fatigued after practicing transfers. ? ?Very frequent PVCs this am 40-50/min with short runs NSVT ? ?TEE:  Mild restriction of P2. Severe central/posterior MR due to P2 restriction and annular dilation. EF 15-20%, LV severely dilated w/ severe HK. RV dilated + severe HK. Mod TR  ? ?R/LHCAssessment: ?1. Mild non-obstructive CAD ?2. NICM EF < 20% ?3. Low filling pressures with normal cardiac output ?4. No significant v-waves in PCWP tracing despite presence of severe MR on TEE (may be influenced by low BP and filling pressures) ? ?Ao = 84/60 (70) ?LV = 83/6 ?RA = 1 ?RV = 20/5 ?PA = 21/7 (14) ?PCW = 3 (no prominent v waves) ?Fick cardiac output/index = 6.1/3.0 ?PVR = 1.8 WU ?Ao sat = 96% ?PA sat = 71%, 74% ? ? ?Objective:   ?Weight Range: ?87.4 kg ?Body mass index is 21.71 kg/m?.  ? ?Vital Signs:   ?Temp:  [97.4 ?F (36.3 ?C)-98 ?F (36.7 ?C)] 97.9 ?F (36.6 ?C) (04/07 0500) ?Pulse Rate:  [86-107] 92 (04/07 1235) ?Resp:  [16-19] 16 (04/07 1235) ?BP: (97-106)/(60-78) 99/74 (04/07 1235) ?SpO2:  [97 %-100 %] 99 % (04/07 1235) ?Weight:  [87.4 kg] 87.4 kg (04/07 0500) ?Last BM Date : 05/19/21 ? ?Weight change: ?Filed Weights  ? 05/18/21 0252 05/19/21 0353 05/20/21 0500  ?Weight: 72.5 kg 87.6 kg 87.4 kg  ? ? ?Intake/Output:  ? ?Intake/Output Summary (Last 24 hours) at 05/20/2021 1406 ?Last data filed at 05/20/2021 2993 ?Gross per 24 hour  ?Intake 610 ml  ?Output 1950 ml  ?Net -1340 ml  ?  ? ? ?Physical Exam  ? ?General:  Elderly. Fatigued appearing. No distress. ?HEENT: normal ?Neck: supple. no JVD. Carotids 2+ bilat; no bruits.  ?Cor: PMI nondisplaced. Regular rate & rhythm with ectopy. No rubs, gallops, 2/6  MR. ?Lungs: clear ?Abdomen: soft, nontender, nondistended.  ?Extremities: no cyanosis, clubbing, rash, edema ?Neuro: alert & orientedx3, cranial nerves grossly intact. moves all 4 extremities w/o difficulty. Affect pleasant ? ? ? ?Telemetry  ? ?SR 80s-90s, 40-50 PVCs/min, frequent runs NSVT up to 10 beats ? ?Labs  ?  ?CBC ?Recent Labs  ?  05/18/21 ?0448 05/18/21 ?1056 05/18/21 ?1107 05/19/21 ?0606  ?WBC 5.8  --   --  4.9  ?HGB 13.8   < > 12.9* 12.9*  ?HCT 42.1   < > 38.0* 41.2  ?MCV 78.1*  --   --  79.8*  ?PLT 166  --   --  178  ? < > = values in this interval not displayed.  ? ?Basic Metabolic Panel ?Recent Labs  ?  05/19/21 ?0606 05/20/21 ?0500  ?NA 131* 131*  ?K 5.1 4.9  ?CL 99 99  ?CO2 28 26  ?GLUCOSE 83 84  ?BUN 25* 22  ?CREATININE 1.50* 1.41*  ?CALCIUM 8.6* 8.8*  ?MG 2.1 2.1  ? ?Liver Function Tests ?No results for input(s): AST, ALT, ALKPHOS, BILITOT, PROT, ALBUMIN in the last 72 hours. ?No results for input(s): LIPASE, AMYLASE in the last 72 hours. ?Cardiac Enzymes ?No results for input(s): CKTOTAL, CKMB, CKMBINDEX, TROPONINI in the last 72 hours. ? ?BNP: ?BNP (last 3 results) ?Recent Labs  ?  05/09/21 ?7169  05/10/21 ?0205 05/13/21 ?2142  ?BNP >4,500.0* >4,500.0* >4,500.0*  ? ? ?ProBNP (last 3 results) ?No results for input(s): PROBNP in the last 8760 hours. ? ? ?D-Dimer ?No results for input(s): DDIMER in the last 72 hours. ?Hemoglobin A1C ?No results for input(s): HGBA1C in the last 72 hours. ?Fasting Lipid Panel ?No results for input(s): CHOL, HDL, LDLCALC, TRIG, CHOLHDL, LDLDIRECT in the last 72 hours. ?Thyroid Function Tests ?No results for input(s): TSH, T4TOTAL, T3FREE, THYROIDAB in the last 72 hours. ? ?Invalid input(s): FREET3 ? ?Other results: ? ? ?Imaging  ? ? ?No results found. ? ? ?Medications:   ? ? ?Scheduled Medications: ? amiodarone  200 mg Oral BID  ? apixaban  5 mg Oral BID  ? atorvastatin  10 mg Oral Daily  ? Chlorhexidine Gluconate Cloth  6 each Topical Daily  ? leptospermum manuka honey   1 application. Topical Daily  ? magnesium oxide  400 mg Oral Daily  ? mexiletine  200 mg Oral Q12H  ? midodrine  5 mg Oral TID WC  ? sodium chloride flush  3 mL Intravenous Q12H  ? sodium chloride flush  3 mL Intravenous Q12H  ? spironolactone  12.5 mg Oral Daily  ? ? ?Infusions: ? sodium chloride    ? ? ?PRN Medications: ?sodium chloride, acetaminophen, ondansetron (ZOFRAN) IV, sodium chloride flush, sodium chloride flush ? ? ? ?Patient Profile  ? ?81 y.o. male with history of chronic biventricular HF, NICM, CAD, severe MR/TR, HTN, obesity and multiple recent admissions, now admitted w/ acute PE and a/c CHF.  ? ?Echo this admission, EF 25%, mildly dilated RV with moderately decreased systolic function, severe MR and TR. IVC dilated.  ? ? ?Assessment/Plan  ? ?1. Acute on chronic Biventricular HF   ?- ECHO 04/2020 EF 25-30% with mild MR.   ?- Echo 04/04/21 EF LVEF 20-25%, RV moderately reduced, LA/RA severely dilated, and severe MR.  ?- Echo per Dr. 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 ?- Holding diuretics d/t low volume status. Scr stable at 1.4. ?- GDMT limited d/t hypotension. Continue midodrine 5 mg TID ?- Arlyce Harman restarted today at 12.5 mg. Watch K. ?- Given frequent PVCs/NSVT will get cardiac MRI to rule out sarcoidosis. However, suspicion not very high. ?  ?2. CAD ?- LHC w/ mild non-obstructive CAD ?- Continue statin ?  ?3. Mitral Regurgitation ?- Severe on echo this admit and last echo 02/23 ?- TEE showed mild restriction of P2. Severe central/posterior MR due to P2 restriction and annular dilation ?- functional, severely dilated LA and  LV  ?- Seen by structural team. Considering mitral TEER. Next procedure date 04/20. Need to suppress PVCs first. ?  ?4. Tricuspid Regurgitation  ?- moderate on TEE  ?- functional, severely dilated RA and RV   ?- HF optimization per above  ?  ?5. Acute PE ?- Continue Eliquis. ?- No evidence of DVT on [place] ?- ? 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  ?6. Hyperlipidemia ?- Continue  statin ?  ?7. CKD IIIa ?- Scr 1.41, baseline around 1.4-1.7 last few months ?- follow BMP  ?  ?  8. NSVT/PVCs ?- suspect contributing to CM , need to suppress. ?- Added mexiletine 200 mg BID on 04/06 ?- Still with very frequent PVCs 40-50/min + short runs NSVT ?- Stop po amio. Give 150 mg amiodarone IV followed by amio gtt. ?- Keep K > 4 and Mag > 2 ? ?9. Right LE wounds ?- occurred after bumping leg on furniture a few weeks ago ?- ABIs overestimated d/t noncompressible vessels ?- WOC has seen, recs provided  ? ? ?Length of Stay: 6 ? ?FINCH, LINDSAY N, PA-C  ?05/20/2021, 2:06 PM ? ?Advanced Heart Failure Team ?Pager [phone] (M-F; 7a - 5p)  ?Please contact [facility] for night-coverage after hours (5p -7a ) and weekends on [email or web address] ? ? ?Patient seen and examined with the above-signed Advanced Practice Provider and/or Housestaff. I personally reviewed laboratory data, imaging studies and relevant notes. I independently examined the patient and formulated the important aspects of the plan. I have edited the note to reflect any of my changes or salient points. I have personally discussed the plan with the patient and/or family. ? ?Received IVF yesterday due to very low filling pressures and orthostasis. BP better today. Feels ok. Remains very weak. More dyspneic today. + mild orthopnea. No PND. CVP 10-11 (checked personally) ? ?Having very frequent PVCs 40-50/min despite po amio and mexilitene ? ?Seen by Dr. Ali Lowe yesterday for possible mTEER ? ?General:  Weak appearing. No resp difficulty ?HEENT:  normal ?Neck: supple. JVP 10. Carotids 2+ bilat; no bruits. No lymphadenopathy or thryomegaly appreciated. ?Cor: PMI nondisplaced. Irregular rate & rhythm. 2/6 MR ?Lungs: clear ?Abdomen: soft, nontender, no

## 2021-05-20 NOTE — Progress Notes (Signed)
Notified by RN patient more dyspneic this afternoon. ? ?CVP 10.  ? ?Give lasix 40 mg IV once. ?

## 2021-05-20 NOTE — Progress Notes (Signed)
?Progress Note ? ? ?Patient: Matthew [name] [id number] DOB: Mar 02, 1940 DOA: 05/13/2021     6 ?DOS: the patient was seen and examined on 05/20/2021 ?  ?Brief hospital course: ?Mr. Matthew [name] was admitted to the hospital with the working diagnosis of decompensated heart failure.  ? ?81 yo male with the past medical history of CAD, heart failure, hypertension, severe mitral regurgitation, and BPH, who presented with chest pain and dyspnea. Reported acute onset of substernal/ left sided chest pain, radiated to his left arm and associated with dyspnea. Positive lower extremity edema.  ?Recent hospitalization 03/25 to 05/10/21 for urinary tract infection, complicated with metabolic encephalopathy. Outpatient follow up with cardiology 24 hrs before patient admitted this time, diuretic therapy was increased and plan for further work up cardiac catheterization and transesophageal echocardiogram. On his initial physical examination his blood pressure was 106/95, HR 107, RR 22, 02 saturation 97%, positive JVD, heart with S1 and S2 present and tachycardic, lungs with no wheezing or rales, abdomen not distended and positive lower extremity edema.  ? ?Na 136, K 5.0, Cl 102, bicarbonate at 25, glucose 91 bun 18, cr 1,42 ?BNP >4,500 ?Wbc 4.3, hgb 12.9 hct 41.1 plt 104 ? ?Chest radiograph with cardiomegaly, positive hilar vascular congestion and fluid in the right fissure.  ? ?EKG 93 bpm, normal axis, qtc 509, right bundle branch block, sinus rhythm with PVC, no significant ST segment or T wave changes.  ? ?CT chest with subsegmental and segmental pulmonary artery fillings in the right lower lobe. Small bilateral pleural effusions,  ?Aneurysmal dilatation of the ascending aorta.  ? ?Patient was placed on furosemide for diuresis, and anticoagulation with apixaban.  ?Further work up with right and left cardiac catheterization plus TEE in preparation for possible MitraClip.  ? ? ?Assessment and Plan: ?* Acute pulmonary embolism  ([name]) ?Oxygenating at 99% on room air.  ?No chest pain. ?Continue anticoagulation with apixaban.  ? ?Acute on chronic congestive heart failure ([name]) ?Echocardiogram with LV systolic function reduced to 25%, with global hypokinesis. RV systolic function is preserved. RV size with severe dilatation, RVSP 35,6 mmHg, severe dilatation of right and left atrium. Severe mitral regurgitation.  ? ?Urine output is 1,900 ml over last 24 hrs, since   ?Blood pressure 97 to 123456 mmHg systolic.  ?Personally reviewed telemetry, HR in 70 to 80 range, continue to have frequent PVC and NSVT.  ? ?Medical therapy with amiodarone, mexiletine and spironolactone. ?Continue midodrine for blood pressure support.  ?His volume status has improved.  ? ?Patient has been deemed good candidate for mitral transcatheter edge to edge repair.  ? ? ?Chronic kidney disease, stage 3a ([name]) ?Hyponatremia. Hypomagnesemia.  ? ?Patient with improvement in volume status, his Na is 131, K 4.9, Cl 99, bicarbonate 26 and cr at 1.41 ? ?Plan to continue with spironolactone and close follow up on renal function and electrolytes.  ? ? ?Dyslipidemia ?Continue with statin therapy.  ? ?Ascending aortic aneurysm ([name]) ?CT showing aneurysmal dilation of ascending aorta measuring 4.5 cm.  ?-Radiologist recommending semi-annual imaging followup by CTA or MRA ?-Outpatient cardiothoracic surgery follow-up ? ? ? ? ?  ? ?Subjective: patient is feeling better, positive bowel movement yesterday, no dyspnea or chest pain.  ? ?Physical Exam: ?Vitals:  ? 05/19/21 2154 05/20/21 0021 05/20/21 0500 05/20/21 0805  ?BP: 98/78 103/64 97/60 104/74  ?Pulse: 86 97 88 (!) 107  ?Resp: 16 16 19 18   ?Temp: (!) 97.4 ?F (36.3 ?C) 98 ?F (36.7 ?C) 97.9 ?F (36.6 ?  C)   ?TempSrc: Oral Oral Oral   ?SpO2: 97% 100% 99% 99%  ?Weight:   87.4 kg   ?Height:      ? ?Neurology awake and alert ?ENT with no pallor ?Cardiovascular with S1 and S2 present and rhythmic, positive extra beats, positive systolic murmur  at the apex, no gallops or rubs ?No JVD ?Trace non pitting bilateral lower extremity edema ?Respiratory with no wheezing or rales ?Abdomen soft and not distended  ?Data Reviewed: ? ? ? ?Family Communication: no family at the bedside  ? ?Disposition: ?Status is: Inpatient ?Remains inpatient appropriate because: started patient on oral antiarrhythmic therapy, need telemetry monitoring  ? Planned Discharge Destination: Home ? ? ? ? ? ?Author: ?Tawni Millers, MD ?05/20/2021 9:30 AM ? ?For on call review [email or web address].  ?

## 2021-05-20 NOTE — TOC CM/SW Note (Signed)
Patient arranged with [facility] for [facility], Will need HH orders for [facility] and PT with [facility]. Will continue to follow for dc needs. Isidoro Donning RN3 CCM, Heart Failure TOC CM [id number]  ?

## 2021-05-21 DIAGNOSIS — I5023 Acute on chronic systolic (congestive) heart failure: Secondary | ICD-10-CM | POA: Diagnosis not present

## 2021-05-21 DIAGNOSIS — N179 Acute kidney failure, unspecified: Secondary | ICD-10-CM

## 2021-05-21 DIAGNOSIS — I2601 Septic pulmonary embolism with acute cor pulmonale: Secondary | ICD-10-CM

## 2021-05-21 DIAGNOSIS — E785 Hyperlipidemia, unspecified: Secondary | ICD-10-CM | POA: Diagnosis not present

## 2021-05-21 DIAGNOSIS — N189 Chronic kidney disease, unspecified: Secondary | ICD-10-CM

## 2021-05-21 LAB — BASIC METABOLIC PANEL
Anion gap: 11 (ref 5–15)
BUN: 26 mg/dL — ABNORMAL HIGH (ref 8–23)
CO2: 24 mmol/L (ref 22–32)
Calcium: 9 mg/dL (ref 8.9–10.3)
Chloride: 94 mmol/L — ABNORMAL LOW (ref 98–111)
Creatinine, Ser: 1.79 mg/dL — ABNORMAL HIGH (ref 0.61–1.24)
GFR, Estimated: 38 mL/min — ABNORMAL LOW (ref >60)
Glucose, Bld: 164 mg/dL — ABNORMAL HIGH (ref 70–99)
Potassium: 5 mmol/L (ref 3.5–5.1)
Sodium: 129 mmol/L — ABNORMAL LOW (ref 135–145)

## 2021-05-21 LAB — CBC
HCT: 38.1 % — ABNORMAL LOW (ref 39.0–52.0)
Hemoglobin: 12.4 g/dL — ABNORMAL LOW (ref 13.0–17.0)
MCH: 25.9 pg — ABNORMAL LOW (ref 26.0–34.0)
MCHC: 32.5 g/dL (ref 30.0–36.0)
MCV: 79.7 fL — ABNORMAL LOW (ref 80.0–100.0)
Platelets: 171 10*3/uL (ref 150–400)
RBC: 4.78 MIL/uL (ref 4.22–5.81)
RDW: 20.9 % — ABNORMAL HIGH (ref 11.5–15.5)
WBC: 4.4 10*3/uL (ref 4.0–10.5)
nRBC: 0 % (ref 0.0–0.2)

## 2021-05-21 LAB — MAGNESIUM: Magnesium: 2.1 mg/dL (ref 1.7–2.4)

## 2021-05-21 NOTE — Progress Notes (Signed)
?Progress Note ? ? ?Patient: Matthew [name] [id number] DOB: Jan 24, 1941 DOA: 05/13/2021     7 ?DOS: the patient was seen and examined on 05/21/2021 ?  ?Brief hospital course: ?Matthew [name] was admitted to the hospital with the working diagnosis of decompensated heart failure.  ? ?81 yo male with the past medical history of CAD, heart failure, hypertension, severe mitral regurgitation, and BPH, who presented with chest pain and dyspnea. Reported acute onset of substernal/ left sided chest pain, radiated to his left arm and associated with dyspnea. Positive lower extremity edema.  ?Recent hospitalization 03/25 to 05/10/21 for urinary tract infection, complicated with metabolic encephalopathy. Outpatient follow up with cardiology 24 hrs before patient admitted this time, diuretic therapy was increased and plan for further work up cardiac catheterization and transesophageal echocardiogram. On his initial physical examination his blood pressure was 106/95, HR 107, RR 22, 02 saturation 97%, positive JVD, heart with S1 and S2 present and tachycardic, lungs with no wheezing or rales, abdomen not distended and positive lower extremity edema.  ? ?Na 136, K 5.0, Cl 102, bicarbonate at 25, glucose 91 bun 18, cr 1,42 ?BNP >4,500 ?Wbc 4.3, hgb 12.9 hct 41.1 plt 104 ? ?Chest radiograph with cardiomegaly, positive hilar vascular congestion and fluid in the right fissure.  ? ?EKG 93 bpm, normal axis, qtc 509, right bundle branch block, sinus rhythm with PVC, no significant ST segment or T wave changes.  ? ?CT chest with subsegmental and segmental pulmonary artery fillings in the right lower lobe. Small bilateral pleural effusions,  ?Aneurysmal dilatation of the ascending aorta.  ? ?Patient was placed on furosemide for diuresis, and anticoagulation with apixaban.  ?Frequent PVC and NSVT, placed on amiodarone and mexiletine. ? ?Cardiac catheterization with no significant coronary artery disease. Pulmonary capillary wedge pressure 3.  CO/CI 6.1/3.0 ?TEE with LV EF less than 20%, severe reduction in RV systolic function. Sever mitral regurgitation.  ? ?Plan for possible mitral valve intervention later this month. ?Pending renal function to improve before discharge.   ?  ? ? ?Assessment and Plan: ?* Acute pulmonary embolism ([place]) ?Oxygenating at 99% on room air.  ?No chest pain. ?Continue anticoagulation with apixaban.  ? ?Acute on chronic congestive heart failure ([place]) ?Echocardiogram with LV systolic function reduced to 25%, with global hypokinesis. RV systolic function is preserved. RV size with severe dilatation, RVSP 35,6 mmHg, severe dilatation of right and left atrium. Severe mitral regurgitation.  ? ?Urine output is 1,900 ml over last 24 hrs, since   ?Blood pressure 97 to 123456 mmHg systolic.  ?Personally reviewed telemetry, HR in 70 to 80 range, continue to have frequent PVC and NSVT.  ? ?Continue with amiodarone, and mexiletine, noted decreased PVC frequency, telemetry personally reviewed.  ?Continue with spironolactone. ?Blood pressure support with midodrine.  ?Improved volume status, holding diuresis for now.  ?Patient has been deemed good candidate for mitral transcatheter edge to edge repair.  ? ? ?Acute kidney injury superimposed on chronic kidney disease ([place]) ?CKD stage 3a, Hyponatremia. Hypomagnesemia.  ? ?Worsening hyponatremia and renal function likely due to over diuresis. ?Renal function today with serum cr at 1,79 with K at 5,0 and serum bicarbonate at 24 with Na at 129. ? ?Continue holding loop diuretic therapy and continue with spironolactone. ?Follow up renal function in am, avoid hypotension and nephrotoxic medications.  ? ? ?Dyslipidemia ?Continue with statin therapy.  ? ?Ascending aortic aneurysm ([place]) ?CT showing aneurysmal dilation of ascending aorta measuring 4.5 cm.  ?-Radiologist recommending semi-annual imaging  followup by CTA or MRA ?-Outpatient cardiothoracic surgery follow-up ? ? ? ? ?  ? ?Subjective: patient  with improvement in dyspnea this am, no chest pain. Improvement in mobility ? ? ?Physical Exam: ?Vitals:  ? 05/20/21 2139 05/21/21 0400 05/21/21 0811 05/21/21 1129  ?BP: 97/74 (!) 85/61 97/71 105/74  ?Pulse: 80 74 65 100  ?Resp: 20 15 20 15   ?Temp: 97.8 ?F (36.6 ?C) 98.1 ?F (36.7 ?C) 97.6 ?F (36.4 ?C) (!) 97.4 ?F (36.3 ?C)  ?TempSrc:  Oral Axillary Axillary  ?SpO2:  100% 99%   ?Weight:  90 kg    ?Height:      ? ?Neurology awake and alert ?ENT with mild pallor ?Cardiovascular with S1 and S2 present and rhythmic with no gallops or murmurs ?No JVD ?No lower extremity edema ?Respiratory with no rale or wheezing ?Abdomen soft and not distended  ?Data Reviewed: ? ? ? ?Family Communication: no family at the bedside  ? ?Disposition: ?Status is: Inpatient ?Remains inpatient appropriate because: renal failure.  ? Planned Discharge Destination: Home ? ? ? ?Author: ?Tawni Millers, MD ?05/21/2021 11:40 AM ? ?For on call review [email or web address].  ?

## 2021-05-21 NOTE — Progress Notes (Signed)
? ? ?PCP-Cardiologist: Larae Grooms, MD  ? ?Subjective:   ? ?Less dyspnea feeling better  ? ? ?Objective:   ?Weight Range: ?90 kg ?Body mass index is 22.35 kg/m?.  ? ?Vital Signs:   ?Temp:  [97.4 ?F (36.3 ?C)-98.1 ?F (36.7 ?C)] 97.6 ?F (36.4 ?C) (04/08 [id number]) ?Pulse Rate:  [65-92] 65 (04/08 0811) ?Resp:  [15-20] 20 (04/08 [id number]) ?BP: (85-99)/(61-76) 97/71 (04/08 [id number]) ?SpO2:  [99 %-100 %] 99 % (04/08 0811) ?Weight:  [90 kg] 90 kg (04/08 0400) ?Last BM Date : 05/20/21 ? ?Weight change: ?Filed Weights  ? 05/19/21 0353 05/20/21 0500 05/21/21 0400  ?Weight: 87.6 kg 87.4 kg 90 kg  ? ? ?Intake/Output:  ? ?Intake/Output Summary (Last 24 hours) at 05/21/2021 0813 ?Last data filed at 05/21/2021 0400 ?Gross per 24 hour  ?Intake 221.42 ml  ?Output 1000 ml  ?Net -778.58 ml  ?  ? ? ?Physical Exam  ? ?Tall thin black male ?Looks dry ?JVP not elevated ?MR murmur  ?Abdomen benign ?No edema ?Tegaderm on RLE  ? ? ? ?Telemetry  ? ?SR 80s-90s, 40-50 PVCs/min, frequent runs NSVT up to 10 beats ? ?Labs  ?  ?CBC ?Recent Labs  ?  05/19/21 ?0606 05/21/21 ?0248  ?WBC 4.9 4.4  ?HGB 12.9* 12.4*  ?HCT 41.2 38.1*  ?MCV 79.8* 79.7*  ?PLT 178 171  ? ?Basic Metabolic Panel ?Recent Labs  ?  05/20/21 ?0500 05/21/21 ?0248  ?NA 131* 129*  ?K 4.9 5.0  ?CL 99 94*  ?CO2 26 24  ?GLUCOSE 84 164*  ?BUN 22 26*  ?CREATININE 1.41* 1.79*  ?CALCIUM 8.8* 9.0  ?MG 2.1 2.1  ? ?Liver Function Tests ?No results for input(s): AST, ALT, ALKPHOS, BILITOT, PROT, ALBUMIN in the last 72 hours. ?No results for input(s): LIPASE, AMYLASE in the last 72 hours. ?Cardiac Enzymes ?No results for input(s): CKTOTAL, CKMB, CKMBINDEX, TROPONINI in the last 72 hours. ? ?BNP: ?BNP (last 3 results) ?Recent Labs  ?  05/09/21 ?[id number] 05/10/21 ?0205 05/13/21 ?2142  ?BNP >4,500.0* >4,500.0* >4,500.0*  ? ? ?ProBNP (last 3 results) ?No results for input(s): PROBNP in the last 8760 hours. ? ? ? ? ?Other results: ? ? ?Imaging  ? ? ?MR CARDIAC MORPHOLOGY W WO CONTRAST ? ?Result Date:  05/20/2021 ?CLINICAL DATA:  76M with HFrEF (EF 25%), nonobstructive CAD on cath EXAM: CARDIAC MRI TECHNIQUE: The patient was scanned on a 1.5 Tesla Siemens magnet. A dedicated cardiac coil was used. Functional imaging was done using Fiesta sequences. 2,3, and 4 chamber views were done to assess for RWMA's. Modified Simpson's rule using a short axis stack was used to calculate an ejection fraction on a dedicated work Conservation officer, nature. The patient received 10 cc of Gadavist. After 10 minutes inversion recovery sequences were used to assess for infiltration and scar tissue. CONTRAST:  10 cc  of Gadavist FINDINGS: Left ventricle: -Unable to quantify volumes due to significant motion artifact on cine images likely from frequent PVCs, but visually LV is severely dilated with severe systolic dysfunction -Elevated ECV (37%) -Basal septal midwall LGE -RV insertion site LGE Right ventricle: visually RV is severely dilated with moderate systolic dysfunction Left atrium: Severe enlargement Right atrium: Severe enlargement Mitral valve: Moderate to severe regurgitation (was not quantified) Aortic valve: Trivial regurgitation Tricuspid valve: Moderate to severe regurgitation (was not quantified) Pulmonic valve: Mild regurgitation Aorta: ascending aortic aneurysm measuring 44mm Pericardium: Trivial effusion IMPRESSION: 1. Limited study as patient refused to continue shortly after contrast administration. Single shot  LGE images and post contrast T1 maps were obtained, but high resolution LGE images and flow quantification to assess mitral/tricuspid regurgitation were not done 2. Unable to quantify volumes due to significant motion artifact on cine images likely from frequent PVCs, but visually LV is severely dilated with severe systolic dysfunction 3. Visually RV is severely dilated with moderate systolic dysfunction 4. Basal septal midwall late gadolinium enhancement, which is a scar pattern seen in nonischemic  cardiomyopathies and associated with worse prognosis 5. RV insertion site LGE, which is a nonspecific finding often seen in setting of elevated pulmonary pressures 6.  Ascending aortic aneurysm measuring 35mm Electronically Signed   By: Oswaldo Milian M.D.   On: 05/20/2021 19:58   ? ? ?Medications:   ? ? ?Scheduled Medications: ? apixaban  5 mg Oral BID  ? atorvastatin  10 mg Oral Daily  ? Chlorhexidine Gluconate Cloth  6 each Topical Daily  ? leptospermum manuka honey  1 application. Topical Daily  ? magnesium oxide  400 mg Oral Daily  ? mexiletine  200 mg Oral Q12H  ? midodrine  5 mg Oral TID WC  ? sodium chloride flush  3 mL Intravenous Q12H  ? sodium chloride flush  3 mL Intravenous Q12H  ? spironolactone  12.5 mg Oral Daily  ? ? ?Infusions: ? sodium chloride    ? amiodarone 30 mg/hr (05/21/21 0400)  ? ? ?PRN Medications: ?sodium chloride, acetaminophen, ondansetron (ZOFRAN) IV, sodium chloride flush, sodium chloride flush ? ? ? ?Patient Profile  ? ?81 y.o. male with history of chronic biventricular HF, NICM, CAD, severe MR/TR, HTN, obesity and multiple recent admissions, now admitted w/ acute PE and a/c CHF.  ? ?Echo this admission, EF 25%, mildly dilated RV with moderately decreased systolic function, severe MR and TR. IVC dilated.  ? ? ?Assessment/Plan  ? ?1. Acute on chronic Biventricular HF   ?- Improved GDMT limited by low BP.  He appears dry and given volume yesterday COOX have been 70% or more Cr up 1.79 hold demedex/diuretic continue milrinone at current dose Only on low dose aldactone currently  ?  ?2. CAD ?- LHC w/ mild non-obstructive CAD ?- Continue statin ?  ?3. Mitral Regurgitation ?- Severe has been seen by Dr Hillery Hunter considering TEER  ?  ?4. Tricuspid Regurgitation  ?- moderate on TEE  ?- functional, severely dilated RA and RV   ?- HF optimization per above  ?  ?5. Acute PE ?- Continue Eliquis. ?- No evidence of DVT on [place] ?- ? Evidence of RV strain on CTA, but suspect likely chronic  changes from RV failure ?  ?6. Hyperlipidemia ?- Continue  statin ?  ?7. CKD IIIa ?- Scr 1.79 holding diuretic  ?  ?8. NSVT/PVCs ?- improved on amiodarone and mexiletine Mg 2.1  K 5.0  ? ?9. Right LE wounds ?- occurred after bumping leg on furniture a few weeks ago ?- ABIs overestimated d/t noncompressible vessels ?- WOC has seen, recs provided  ?- Tegaderm in place  ? ? ?Length of Stay: 7 ? ?Jenkins Rouge, MD  ?05/21/2021, 8:13 AM ? ? ? ? ? ? ? ? ? ? ?

## 2021-05-21 NOTE — Progress Notes (Addendum)
Physical Therapy Treatment ?Patient Details ?Name: Matthew [name] ?MRN: [id number] ?DOB: December 16, 1940 ?Today's Date: 05/21/2021 ? ? ?History of Present Illness 81 y.o. male adm 3/31 with SOB with RLL PE treated with Heparin> Eliquis. Admission 3/25-28/2023 with AMS and UTI. L radial heart cath completed on 4/5. PMHx: CAD, NICM, CHF, HTN, OA, obesity ? ?  ?PT Comments  ? ? Pt agreeable to amb and was able to amb a short distance in hallway. Pt with extremely flexed posture with ambulation which I believe is likely an issue at baseline but suspect is exacerbated by his current deconditioning. Per MD plan is for valve surgery in the future after pt has regained some strength at home. If pt is agreeable he could benefit from inpatient rehab to improve strength, mobility and activity tolerance prior to return home for successful transition and for hopeful future valve surgery.   ?Recommendations for follow up therapy are one component of a multi-disciplinary discharge planning process, led by the attending physician.  Recommendations may be updated based on patient status, additional functional criteria and insurance authorization. ? ?Follow Up Recommendations ? Acute inpatient rehab (3hours/day) ?  ?  ?Assistance Recommended at Discharge Frequent or constant Supervision/Assistance  ?Patient can return home with the following A little help with walking and/or transfers;A little help with bathing/dressing/bathroom;Assistance with cooking/housework;Assist for transportation;Help with stairs or ramp for entrance ?  ?Equipment Recommendations ? None recommended by PT  ?  ?Recommendations for Other Services   ? ? ?  ?Precautions / Restrictions Precautions ?Precautions: Other (comment) ?Precaution Comments: L radial heart cath on 4/5 ?Restrictions ?Weight Bearing Restrictions: No  ?  ? ?Mobility ? Bed Mobility ?  ?  ?  ?  ?  ?  ?  ?General bed mobility comments: Pt sitting EOB ?  ? ?Transfers ?Overall transfer level: Needs  assistance ?Equipment used: Rolling Suber (2 wheels) ?Transfers: Sit to/from Stand, Bed to chair/wheelchair/BSC ?Sit to Stand: Min guard, From elevated surface ?  ?  ?  ?  ?  ?General transfer comment: Incr time and effort to rise. Min guard for safety and lines. ?  ? ?Ambulation/Gait ?Ambulation/Gait assistance: Min assist ?Gait Distance (Feet): 60 Feet ?Assistive device: Rollator (4 wheels) ?Gait Pattern/deviations: Trunk flexed, Step-through pattern, Decreased stride length, Knee flexed in stance - right, Knee flexed in stance - left ?Gait velocity: decr ?Gait velocity interpretation: 1.31 - 2.62 ft/sec, indicative of limited community ambulator ?  ?General Gait Details: Assist for balance,safety and lines. Verbal cues to stand more upright. Pt with severe forward flexion which isn't entirely new but suspect is worse with deconditioning. ? ? ?Stairs ?  ?  ?  ?  ?  ? ? ?Wheelchair Mobility ?  ? ?Modified Rankin (Stroke Patients Only) ?  ? ? ?  ?Balance Overall balance assessment: Needs assistance ?Sitting-balance support: Feet supported ?Sitting balance-[name] Scale: Good ?  ?Postural control: Posterior lean ?Standing balance support: Bilateral upper extremity supported, During functional activity ?Standing balance-[name] Scale: Poor ?Standing balance comment: rollator and min guard for static standing ?  ?  ?  ?  ?  ?  ?  ?  ?  ?  ?  ?  ? ?  ?Cognition Arousal/Alertness: Awake/alert ?Behavior During Therapy: [facility] for tasks assessed/performed ?Overall Cognitive Status: Within Functional Limits for tasks assessed ?  ?  ?  ?  ?  ?  ?  ?  ?  ?  ?  ?  ?  ?  ?  ?  ?  ?  ?  ? ?  ?  Exercises   ? ?  ?General Comments General comments (skin integrity, edema, etc.): VSS on RA ?  ?  ? ?Pertinent Vitals/Pain Pain Assessment ?Pain Assessment: No/denies pain  ? ? ?Home Living   ?  ?  ?  ?  ?  ?  ?  ?  ?  ?   ?  ?Prior Function    ?  ?  ?   ? ?PT Goals (current goals can now be found in the care plan section) Acute Rehab PT  Goals ?Patient Stated Goal: Pt wants to return home with family ?Progress towards PT goals: Progressing toward goals ? ?  ?Frequency ? ? ? Min 3X/week ? ? ? ?  ?PT Plan Discharge plan needs to be updated  ? ? ?Co-evaluation   ?  ?  ?  ?  ? ?  ?AM-PAC PT "6 Clicks" Mobility   ?Outcome Measure ? Help needed turning from your back to your side while in a flat bed without using bedrails?: A Little ?Help needed moving from lying on your back to sitting on the side of a flat bed without using bedrails?: A Little ?Help needed moving to and from a bed to a chair (including a wheelchair)?: A Little ?Help needed standing up from a chair using your arms (e.g., wheelchair or bedside chair)?: A Little ?Help needed to walk in hospital room?: A Little ?Help needed climbing 3-5 steps with a railing? : A Lot ?6 Click Score: 17 ? ?  ?End of Session   ?Activity Tolerance: Patient tolerated treatment well ?Patient left: with call bell/phone within reach;in bed;with bed alarm set ?Nurse Communication: Mobility status ?PT Visit Diagnosis: Other abnormalities of gait and mobility (R26.89);Muscle weakness (generalized) (M62.81) ?  ? ? ?Time: [id number] ?PT Time Calculation (min) (ACUTE ONLY): 25 min ? ?Charges:  $Gait Training: 23-37 mins          ?          ? ?[facility] PT ?Acute Rehabilitation Services ?Pager [phone] ?Office [phone] ? ? ? ?Shary Decamp [facility] ?05/21/2021, 1:28 PM ? ?

## 2021-05-21 NOTE — Progress Notes (Signed)
Inpatient Rehab Admissions Coordinator:  ? ? I spoke with pt at bedside and daughter over the phone regarding potential CIR admit They are interested and daughter states that she can provide 24/7 supervision when he returns home. I will follow for potential admit early this week pending medical readiness.  ? ?Megan Salon, MS, CCC-SLP ?Rehab Admissions Coordinator  ?[phone] (celll) ?[phone] (office) ? ?

## 2021-05-21 NOTE — Progress Notes (Signed)
Inpatient Rehab Admissions Coordinator:  ? ?Pt was screened for CIR candidacy by Megan Salon, MS, CCC-SLP . At this time, Pt. Appears to be a a potential candidate for CIR. I spoke with PT who felt he'd benefit.  I will place  order for rehab consult per protocol for full assessment. Please contact me any with questions. ? ?Megan Salon, MS, CCC-SLP ?Rehab Admissions Coordinator  ?[phone] (celll) ?[phone] (office) ? ?

## 2021-05-22 DIAGNOSIS — I5023 Acute on chronic systolic (congestive) heart failure: Secondary | ICD-10-CM | POA: Diagnosis not present

## 2021-05-22 DIAGNOSIS — N179 Acute kidney failure, unspecified: Secondary | ICD-10-CM | POA: Diagnosis not present

## 2021-05-22 DIAGNOSIS — N189 Chronic kidney disease, unspecified: Secondary | ICD-10-CM | POA: Diagnosis not present

## 2021-05-22 DIAGNOSIS — I2699 Other pulmonary embolism without acute cor pulmonale: Secondary | ICD-10-CM | POA: Diagnosis not present

## 2021-05-22 LAB — MAGNESIUM: Magnesium: 2.2 mg/dL (ref 1.7–2.4)

## 2021-05-22 LAB — BASIC METABOLIC PANEL
Anion gap: 6 (ref 5–15)
BUN: 26 mg/dL — ABNORMAL HIGH (ref 8–23)
CO2: 26 mmol/L (ref 22–32)
Calcium: 9.1 mg/dL (ref 8.9–10.3)
Chloride: 100 mmol/L (ref 98–111)
Creatinine, Ser: 1.7 mg/dL — ABNORMAL HIGH (ref 0.61–1.24)
GFR, Estimated: 40 mL/min — ABNORMAL LOW (ref >60)
Glucose, Bld: 82 mg/dL (ref 70–99)
Potassium: 5.2 mmol/L — ABNORMAL HIGH (ref 3.5–5.1)
Sodium: 132 mmol/L — ABNORMAL LOW (ref 135–145)

## 2021-05-22 NOTE — Progress Notes (Signed)
MD on call Dr Rosita Fire notified of telemetry review and frequent NSVT and PVC's since approx 1700 today. Will restart IV Amio at previous rate per MD orders. Dierdre Highman, RN  ?

## 2021-05-22 NOTE — Progress Notes (Signed)
? ? ?PCP-Cardiologist: Larae Grooms, MD  ? ?Subjective:   ? ?Less dyspnea feeling better Some ectopy on telemetry  ? ? ?Objective:   ?Weight Range: ?88.2 kg ?Body mass index is 21.91 kg/m?.  ? ?Vital Signs:   ?Temp:  [97.4 ?F (36.3 ?C)-98.6 ?F (37 ?C)] 98.6 ?F (37 ?C) (04/09 0527) ?Pulse Rate:  [83-100] 86 (04/09 0527) ?Resp:  [15-19] 16 (04/09 0040) ?BP: (95-105)/(74-78) 100/76 (04/09 0527) ?SpO2:  [99 %-100 %] 99 % (04/09 0527) ?Weight:  [88.2 kg] 88.2 kg (04/09 0422) ?Last BM Date : 05/21/21 ? ?Weight change: ?Filed Weights  ? 05/20/21 0500 05/21/21 0400 05/22/21 0422  ?Weight: 87.4 kg 90 kg 88.2 kg  ? ? ?Intake/Output:  ? ?Intake/Output Summary (Last 24 hours) at 05/22/2021 0815 ?Last data filed at 05/22/2021 [id number] ?Gross per 24 hour  ?Intake 166.23 ml  ?Output 950 ml  ?Net -783.77 ml  ?  ? ? ?Physical Exam  ? ?Tall thin black male ?Looks dry ?JVP not elevated ?MR murmur  ?Abdomen benign ?No edema ?Tegaderm on RLE  ? ? ? ?Telemetry  ? ?SR 80s-90s, 40-50 PVCs/min, frequent runs NSVT up to 10 beats ? ?Labs  ?  ?CBC ?Recent Labs  ?  05/21/21 ?0248  ?WBC 4.4  ?HGB 12.4*  ?HCT 38.1*  ?MCV 79.7*  ?PLT 171  ? ?Basic Metabolic Panel ?Recent Labs  ?  05/21/21 ?0248 05/22/21 ?0433  ?NA 129* 132*  ?K 5.0 5.2*  ?CL 94* 100  ?CO2 24 26  ?GLUCOSE 164* 82  ?BUN 26* 26*  ?CREATININE 1.79* 1.70*  ?CALCIUM 9.0 9.1  ?MG 2.1 2.2  ? ?Liver Function Tests ?No results for input(s): AST, ALT, ALKPHOS, BILITOT, PROT, ALBUMIN in the last 72 hours. ?No results for input(s): LIPASE, AMYLASE in the last 72 hours. ?Cardiac Enzymes ?No results for input(s): CKTOTAL, CKMB, CKMBINDEX, TROPONINI in the last 72 hours. ? ?BNP: ?BNP (last 3 results) ?Recent Labs  ?  05/09/21 ?[id number] 05/10/21 ?0205 05/13/21 ?2142  ?BNP >4,500.0* >4,500.0* >4,500.0*  ? ? ?ProBNP (last 3 results) ?No results for input(s): PROBNP in the last 8760 hours. ? ? ? ? ?Other results: ? ? ?Imaging  ? ? ?No results found. ? ? ?Medications:   ? ? ?Scheduled Medications: ?  apixaban  5 mg Oral BID  ? atorvastatin  10 mg Oral Daily  ? Chlorhexidine Gluconate Cloth  6 each Topical Daily  ? leptospermum manuka honey  1 application. Topical Daily  ? magnesium oxide  400 mg Oral Daily  ? mexiletine  200 mg Oral Q12H  ? midodrine  5 mg Oral TID WC  ? sodium chloride flush  3 mL Intravenous Q12H  ? sodium chloride flush  3 mL Intravenous Q12H  ? spironolactone  12.5 mg Oral Daily  ? ? ?Infusions: ? sodium chloride    ? amiodarone 30 mg/hr (05/21/21 0400)  ? ? ?PRN Medications: ?sodium chloride, acetaminophen, ondansetron (ZOFRAN) IV, sodium chloride flush, sodium chloride flush ? ? ? ?Patient Profile  ? ?81 y.o. male with history of chronic biventricular HF, NICM, CAD, severe MR/TR, HTN, obesity and multiple recent admissions, now admitted w/ acute PE and a/c CHF.  ? ?Echo this admission, EF 25%, mildly dilated RV with moderately decreased systolic function, severe MR and TR. IVC dilated.  ? ? ?Assessment/Plan  ? ?1. Acute on chronic Biventricular HF   ?- Improved GDMT limited by low BP.  He appears dry and given volume 05/20/21 COOX have been 70% or more  Cr better 1/79-1.7 continue milrinone at current dose Only on low dose aldactone currently  ?  ?2. CAD ?- LHC w/ mild non-obstructive CAD ?- Continue statin ?  ?3. Mitral Regurgitation ?- Severe has been seen by Dr Hillery Hunter considering TEER  ?  ?4. Tricuspid Regurgitation  ?- moderate on TEE  ?- functional, severely dilated RA and RV   ?- HF optimization per above  ?  ?5. Acute PE ?- Continue Eliquis. ?- No evidence of DVT on [place] ?- ? 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  ?6. Hyperlipidemia ?- Continue  statin ?  ?7. CKD IIIa ?- Scr 1.70  holding diuretic  ?  ?8. NSVT/PVCs ?- improved on amiodarone and mexiletine Mg 2.2  K 5.2  ? ?9. Right LE wounds ?- occurred after bumping leg on furniture a few weeks ago ?- ABIs overestimated d/t noncompressible vessels ?- WOC has seen, recs provided  ?- Tegaderm  in place  ? ?Would wean milrinone in am  ? ?Length of Stay: 8 ? ?Jenkins Rouge, MD  ?05/22/2021, 8:15 AM ? ? ? ? ? ? ? ? ? ? ?

## 2021-05-22 NOTE — Progress Notes (Signed)
?Progress Note ? ? ?Patient: Matthew [name] [id number] DOB: 01/14/1941 DOA: 05/13/2021     8 ?DOS: the patient was seen and examined on 05/22/2021 ?  ?Brief hospital course: ?Matthew [name] was admitted to the hospital with the working diagnosis of decompensated heart failure.  ? ?81 yo male with the past medical history of CAD, heart failure, hypertension, severe mitral regurgitation, and BPH, who presented with chest pain and dyspnea. Reported acute onset of substernal/ left sided chest pain, radiated to his left arm and associated with dyspnea. Positive lower extremity edema.  ?Recent hospitalization 03/25 to 05/10/21 for urinary tract infection, complicated with metabolic encephalopathy. Outpatient follow up with cardiology 24 hrs before patient admitted this time, diuretic therapy was increased and plan for further work up cardiac catheterization and transesophageal echocardiogram. On his initial physical examination his blood pressure was 106/95, HR 107, RR 22, 02 saturation 97%, positive JVD, heart with S1 and S2 present and tachycardic, lungs with no wheezing or rales, abdomen not distended and positive lower extremity edema.  ? ?Na 136, K 5.0, Cl 102, bicarbonate at 25, glucose 91 bun 18, cr 1,42 ?BNP >4,500 ?Wbc 4.3, hgb 12.9 hct 41.1 plt 104 ? ?Chest radiograph with cardiomegaly, positive hilar vascular congestion and fluid in the right fissure.  ? ?EKG 93 bpm, normal axis, qtc 509, right bundle branch block, sinus rhythm with PVC, no significant ST segment or T wave changes.  ? ?CT chest with subsegmental and segmental pulmonary artery fillings in the right lower lobe. Small bilateral pleural effusions,  ?Aneurysmal dilatation of the ascending aorta.  ? ?Patient was placed on furosemide for diuresis, and anticoagulation with apixaban.  ?Frequent PVC and NSVT, placed on amiodarone and mexiletine. ? ?Cardiac catheterization with no significant coronary artery disease. Pulmonary capillary wedge pressure 3.  CO/CI 6.1/3.0 ?TEE with LV EF less than 20%, severe reduction in RV systolic function. Sever mitral regurgitation.  ? ?Plan for possible mitral valve intervention later this month. ?Pending renal function to improve before discharge.   ? ?Plan to transfer patient to CIR.   ? ? ?Assessment and Plan: ?* Acute pulmonary embolism ([place]) ?Oxygenating at 99% on room air.  ?No chest pain. ?Continue anticoagulation with apixaban.  ? ?Acute on chronic congestive heart failure ([place]) ?Echocardiogram with LV systolic function reduced to 25%, with global hypokinesis. RV systolic function is preserved. RV size with severe dilatation, RVSP 35,6 mmHg, severe dilatation of right and left atrium. Severe mitral regurgitation.  ? ?Urine output is 1,900 ml over last 24 hrs, since   ?Blood pressure 97 to 123456 mmHg systolic.  ?Personally reviewed telemetry, HR in 70 to 80 range, continue to have frequent PVC and NSVT.  ? ?Medical therapy with amiodarone, and mexiletine for ectopy.  ?Hold on spironolactone due to hyperkalemia.  ?Blood pressure support with midodrine.  ? ?Patient has been deemed good candidate for mitral transcatheter edge to edge repair.  ? ? ?Acute kidney injury superimposed on chronic kidney disease ([place]) ?CKD stage 3a, Hyponatremia. Hypomagnesemia. Hyperkalemia  ? ?Improved renal function with serum cr at 1,70 with K at 5,2 and serum bicarbonate at 26  ? ?Plan to hold on spironolactone to prevent worsening hyperkalemia and plan to follow up renal function in am. ?Avoid hypotension and nephrotoxic medications.  ? ? ?Dyslipidemia ?Continue with statin therapy.  ? ?Ascending aortic aneurysm ([place]) ?CT showing aneurysmal dilation of ascending aorta measuring 4.5 cm.  ?-Radiologist recommending semi-annual imaging followup by CTA or MRA ?-Outpatient cardiothoracic surgery follow-up ? ? ? ? ?  ? ?  Subjective: Patient is feeling better, no dyspnea or chest pain  ? ?Physical Exam: ?Vitals:  ? 05/22/21 0527 05/22/21 0948 05/22/21  1400 05/22/21 1450  ?BP: 100/76 (!) 96/58 94/68 100/60  ?Pulse: 86 86 90 90  ?Resp:  20 20 20   ?Temp: 98.6 ?F (37 ?C) 98 ?F (36.7 ?C) (!) 96.7 ?F (35.9 ?C) (!) 97.5 ?F (36.4 ?C)  ?TempSrc:  Oral Oral   ?SpO2: 99% 99% 99%   ?Weight:      ?Height:      ? ?Neurology awake and alert ?ENT with no pallor ?Cardiovascular S1 and S2 present and rhythmic with no gallops or rubs, positive systolic murmur at the apex ?No JVD ?No lower extremity edema  ?Respiratory with no wheezing or rales ?Abdomen not distended  ?Data Reviewed: ? ? ? ?Family Communication: no family at the bedside (pastor)  ? ?Disposition: ?Status is: Inpatient ?Remains inpatient appropriate because: pending transfer to CIR  ? Planned Discharge Destination:  CIR  ? ? ?Author: ?Tawni Millers, MD ?05/22/2021 3:03 PM ? ?For on call review [email or web address].  ?

## 2021-05-22 NOTE — Progress Notes (Signed)
Spoke to Dr Rosita Fire regarding this patient's amiodarone not running. Was told to watch his PVC's and if they increase to let him know and he would restart the amiodarone. ?

## 2021-05-22 NOTE — Progress Notes (Signed)
Mobility Specialist Progress Note  ? ? 05/22/21 0915  ?Mobility  ?Activity Ambulated with assistance in room  ?Level of Assistance Contact guard assist, steadying assist  ?Assistive Device Front wheel [place]  ?Distance Ambulated (ft) 36 ft  ?Activity Response Tolerated fair  ?$Mobility charge 1 Mobility  ? ?Pt received on BSC and agreeable. NT present for pericare. Pt had significant trunk flexion. Left in chair with call bell in reach and NT present.  ? ?Matthew [name] ?Mobility Specialist  ?  ?

## 2021-05-23 DIAGNOSIS — I493 Ventricular premature depolarization: Secondary | ICD-10-CM | POA: Diagnosis not present

## 2021-05-23 DIAGNOSIS — N179 Acute kidney failure, unspecified: Secondary | ICD-10-CM | POA: Diagnosis not present

## 2021-05-23 DIAGNOSIS — I5023 Acute on chronic systolic (congestive) heart failure: Secondary | ICD-10-CM | POA: Diagnosis not present

## 2021-05-23 DIAGNOSIS — E785 Hyperlipidemia, unspecified: Secondary | ICD-10-CM | POA: Diagnosis not present

## 2021-05-23 DIAGNOSIS — I2699 Other pulmonary embolism without acute cor pulmonale: Secondary | ICD-10-CM | POA: Diagnosis not present

## 2021-05-23 LAB — RETICULOCYTES
Immature Retic Fract: 9.5 % (ref 2.3–15.9)
RBC.: 4.75 MIL/uL (ref 4.22–5.81)
Retic Count, Absolute: 37 10*3/uL (ref 19.0–186.0)
Retic Ct Pct: 0.8 % (ref 0.4–3.1)

## 2021-05-23 LAB — BASIC METABOLIC PANEL
Anion gap: 7 (ref 5–15)
BUN: 26 mg/dL — ABNORMAL HIGH (ref 8–23)
CO2: 25 mmol/L (ref 22–32)
Calcium: 8.9 mg/dL (ref 8.9–10.3)
Chloride: 98 mmol/L (ref 98–111)
Creatinine, Ser: 1.76 mg/dL — ABNORMAL HIGH (ref 0.61–1.24)
GFR, Estimated: 39 mL/min — ABNORMAL LOW (ref >60)
Glucose, Bld: 85 mg/dL (ref 70–99)
Potassium: 4.9 mmol/L (ref 3.5–5.1)
Sodium: 130 mmol/L — ABNORMAL LOW (ref 135–145)

## 2021-05-23 LAB — IRON AND TIBC
Iron: 28 ug/dL — ABNORMAL LOW (ref 45–182)
Saturation Ratios: 10 % — ABNORMAL LOW (ref 17.9–39.5)
TIBC: 274 ug/dL (ref 250–450)
UIBC: 246 ug/dL

## 2021-05-23 LAB — FOLATE: Folate: 11.1 ng/mL (ref >5.9)

## 2021-05-23 LAB — VITAMIN B12: Vitamin B-12: 308 pg/mL (ref 180–914)

## 2021-05-23 LAB — FERRITIN: Ferritin: 327 ng/mL (ref 24–336)

## 2021-05-23 LAB — MAGNESIUM: Magnesium: 2.3 mg/dL (ref 1.7–2.4)

## 2021-05-23 MED ORDER — MEXILETINE HCL 150 MG PO CAPS
300.0000 mg | ORAL_CAPSULE | Freq: Two times a day (BID) | ORAL | Status: DC
  Administered 2021-05-23 – 2021-06-01 (×19): 300 mg via ORAL
  Filled 2021-05-23 (×21): qty 2

## 2021-05-23 MED ORDER — AMIODARONE HCL 200 MG PO TABS
200.0000 mg | ORAL_TABLET | Freq: Two times a day (BID) | ORAL | Status: DC
  Administered 2021-05-23 – 2021-05-25 (×4): 200 mg via ORAL
  Filled 2021-05-23 (×4): qty 1

## 2021-05-23 NOTE — Progress Notes (Addendum)
? ? Advanced Heart Failure Rounding Note ? ?PCP-Cardiologist: Larae Grooms, MD  ? ?Subjective:   ? ?04/05: RHC Low filling pressures with normal CO.  ?04/06: Seen by structural team for consideration of mitral TEER ? ?Last dose of IV Lasix was 4/7. Scr bumped after getting lasix, from 1.41>>1.79. SCr stable over the weekend ?1.79>>1.70>>1.76. Wt down 2 lb. Denies any current dyspnea. CVP ~4 ? ?Continues w/ high burden PVCs and runs of NSVT.  ?K 4.9 ?Mg pending ? ?Remains on amio gtt at 30/hr + mexiletine.  ? ?Co-ox pending  ? ?Feels ok today. No current complaints. Asking for juice.  ? ?cMRI completed. Limited study as patient refused to continue shortly after contrast administration. Unable to quantify volumes due to significant motion artifact on cine images likely from frequent PVCs, but visually LV is severely dilated with severe systolic dysfunction, RV is severely dilated with moderate systolic dysfunction. + Basal septal midwall late gadolinium enhancement, which is a scar pattern seen in nonischemic cardiomyopathies and associated with worse prognosis.  ? ?TEE:  Mild restriction of P2. Severe central/posterior MR due to P2 restriction and annular dilation. EF 15-20%, LV severely dilated w/ severe HK. RV dilated + severe HK. Mod TR  ? ?R/LHCAssessment: ?1. Mild non-obstructive CAD ?2. NICM EF < 20% ?3. Low filling pressures with normal cardiac output ?4. No significant v-waves in PCWP tracing despite presence of severe MR on TEE (may be influenced by low BP and filling pressures) ? ?Ao = 84/60 (70) ?LV = 83/6 ?RA = 1 ?RV = 20/5 ?PA = 21/7 (14) ?PCW = 3 (no prominent v waves) ?Fick cardiac output/index = 6.1/3.0 ?PVR = 1.8 WU ?Ao sat = 96% ?PA sat = 71%, 74% ? ? ?cMRI 05/20/21 ? ?IMPRESSION: ?1. Limited study as patient refused to continue shortly after ?contrast administration. Single shot LGE images and post contrast T1 ?maps were obtained, but high resolution LGE images and flow ?quantification to  assess mitral/tricuspid regurgitation were not ?done ?  ?2. Unable to quantify volumes due to significant motion artifact on ?cine images likely from frequent PVCs, but visually LV is severely ?dilated with severe systolic dysfunction ?  ?3. Visually RV is severely dilated with moderate systolic ?dysfunction ?  ?4. Basal septal midwall late gadolinium enhancement, which is a scar ?pattern seen in nonischemic cardiomyopathies and associated with ?worse prognosis ?  ?5. RV insertion site LGE, which is a nonspecific finding often seen ?in setting of elevated pulmonary pressures ?  ?6.  Ascending aortic aneurysm measuring 2mm ? ?Objective:   ?Weight Range: ?86.5 kg ?Body mass index is 21.49 kg/m?.  ? ?Vital Signs:   ?Temp:  [96.7 ?F (35.9 ?C)-97.6 ?F (36.4 ?C)] 97.6 ?F (36.4 ?C) (04/10 0505) ?Pulse Rate:  [85-90] 85 (04/10 0505) ?Resp:  [20] 20 (04/10 0505) ?BP: (93-100)/(60-72) 97/72 (04/10 0505) ?SpO2:  [98 %-100 %] 100 % (04/10 0505) ?Weight:  [86.5 kg] 86.5 kg (04/10 0505) ?Last BM Date : 05/22/21 ? ?Weight change: ?Filed Weights  ? 05/21/21 0400 05/22/21 0422 05/23/21 0505  ?Weight: 90 kg 88.2 kg 86.5 kg  ? ? ?Intake/Output:  ? ?Intake/Output Summary (Last 24 hours) at 05/23/2021 1044 ?Last data filed at 05/23/2021 0700 ?Gross per 24 hour  ?Intake 385.86 ml  ?Output 200 ml  ?Net 185.86 ml  ?  ? ? ?Physical Exam  ? ?CVP 4  ?General:  elderly male, sitting up in chair. No respiratory difficulty ?HEENT: normal ?Neck: supple.  JVD not elevated. Carotids 2+ bilat;  no bruits. No lymphadenopathy or thyromegaly appreciated. ?Cor: PMI nondisplaced. Irregularly irregular rhythm, frequent PVCs. 3/6 MR murmur  ?Lungs: clear ?Abdomen: soft, nontender, nondistended. No hepatosplenomegaly. No bruits or masses. Good bowel sounds. ?Extremities: no cyanosis, clubbing, rash, trace b/l LE edema, distal extremities cool to touch + RUE PICC ?Neuro: alert & oriented x 3, cranial nerves grossly intact. moves all 4 extremities w/o  difficulty. Affect pleasant. ? ?Telemetry  ? ?NSR w/ frequent PVCs and runs of NSVT  ? ?Labs  ?  ?CBC ?Recent Labs  ?  05/21/21 ?0248  ?WBC 4.4  ?HGB 12.4*  ?HCT 38.1*  ?MCV 79.7*  ?PLT 171  ? ?Basic Metabolic Panel ?Recent Labs  ?  05/21/21 ?0248 05/22/21 ?0433 05/23/21 ?0454  ?NA 129* 132* 130*  ?K 5.0 5.2* 4.9  ?CL 94* 100 98  ?CO2 24 26 25   ?GLUCOSE 164* 82 85  ?BUN 26* 26* 26*  ?CREATININE 1.79* 1.70* 1.76*  ?CALCIUM 9.0 9.1 8.9  ?MG 2.1 2.2  --   ? ?Liver Function Tests ?No results for input(s): AST, ALT, ALKPHOS, BILITOT, PROT, ALBUMIN in the last 72 hours. ?No results for input(s): LIPASE, AMYLASE in the last 72 hours. ?Cardiac Enzymes ?No results for input(s): CKTOTAL, CKMB, CKMBINDEX, TROPONINI in the last 72 hours. ? ?BNP: ?BNP (last 3 results) ?Recent Labs  ?  05/09/21 ?[id number] 05/10/21 ?0205 05/13/21 ?2142  ?BNP >4,500.0* >4,500.0* >4,500.0*  ? ? ?ProBNP (last 3 results) ?No results for input(s): PROBNP in the last 8760 hours. ? ? ?D-Dimer ?No results for input(s): DDIMER in the last 72 hours. ?Hemoglobin A1C ?No results for input(s): HGBA1C in the last 72 hours. ?Fasting Lipid Panel ?No results for input(s): CHOL, HDL, LDLCALC, TRIG, CHOLHDL, LDLDIRECT in the last 72 hours. ?Thyroid Function Tests ?No results for input(s): TSH, T4TOTAL, T3FREE, THYROIDAB in the last 72 hours. ? ?Invalid input(s): FREET3 ? ?Other results: ? ? ?Imaging  ? ? ?No results found. ? ? ?Medications:   ? ? ?Scheduled Medications: ? apixaban  5 mg Oral BID  ? atorvastatin  10 mg Oral Daily  ? Chlorhexidine Gluconate Cloth  6 each Topical Daily  ? leptospermum manuka honey  1 application. Topical Daily  ? magnesium oxide  400 mg Oral Daily  ? mexiletine  200 mg Oral Q12H  ? midodrine  5 mg Oral TID WC  ? sodium chloride flush  3 mL Intravenous Q12H  ? sodium chloride flush  3 mL Intravenous Q12H  ? ? ?Infusions: ? sodium chloride    ? amiodarone 30 mg/hr (05/23/21 0646)  ? ? ?PRN Medications: ?sodium chloride, acetaminophen,  ondansetron (ZOFRAN) IV, sodium chloride flush, sodium chloride flush ? ? ? ?Patient Profile  ? ?81 y.o. male with history of chronic biventricular HF, NICM, CAD, severe MR/TR, HTN, obesity and multiple recent admissions, now admitted w/ acute PE and a/c CHF.  ? ?Echo this admission, EF 25%, mildly dilated RV with moderately decreased systolic function, severe MR and TR. IVC dilated.  ? ? ?Assessment/Plan  ? ?1. Acute on chronic Biventricular HF   ?- ECHO 04/2020 EF 25-30% with mild MR.   ?- Echo 04/04/21 EF LVEF 20-25%, RV moderately reduced, LA/RA severely dilated, and severe MR.  ?- Echo per Dr. 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 ?- cMRI c/w severe NICM w/ basal septal midwall LGE  ?- Holding diuretics d/t low volume status.  CVP 4. Scr has stabilized ~1.7  ?- GDMT limited d/t hypotension. Continue midodrine 5 mg TID ?- Off Spiro w/ elevated K and SCr  ? ?  ?2. CAD ?- LHC w/ mild non-obstructive CAD ?- Continue statin ?  ?3. Mitral Regurgitation ?- Severe on echo this admit and last echo 02/23 ?- TEE showed mild restriction of P2. Severe central/posterior MR due to P2 restriction and annular dilation ?- functional, severely dilated LA and LV  ?- Seen by structural team. Considering mitral TEER. Next procedure date 04/20. Need to suppress PVCs first. ?  ?4. Tricuspid Regurgitation  ?- moderate on TEE  ?- functional, severely dilated RA and RV   ?- HF optimization per above  ?  ?5. Acute PE ?- Continue Eliquis. ?- No evidence of DVT on [place] ?- ? 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  ?6.  Hyperlipidemia ?- Continue  statin ?  ?7. CKD IIIa ?- Scr  baseline around 1.4-1.7 last few months ?- 1.7 today. Holding diuretics w/ low CVP  ?- follow BMP  ?  ?8. NSVT/PVCs ?- suspect contributing to CM , need to suppress. ?- S

## 2021-05-23 NOTE — Progress Notes (Signed)
?Progress Note ? ? ?Patient: Matthew [name] [id number] DOB: 04-08-1940 DOA: 05/13/2021     9 ?DOS: the patient was seen and examined on 05/23/2021 ?  ?Brief hospital course: ?Matthew [name] was admitted to the hospital with the working diagnosis of decompensated heart failure.  ? ?81 yo male with the past medical history of CAD, heart failure, hypertension, severe mitral regurgitation, and BPH, who presented with chest pain and dyspnea. Reported acute onset of substernal/ left sided chest pain, radiated to his left arm and associated with dyspnea. Positive lower extremity edema.  ?Recent hospitalization 03/25 to 05/10/21 for urinary tract infection, complicated with metabolic encephalopathy. Outpatient follow up with cardiology 24 hrs before patient admitted this time, diuretic therapy was increased and plan for further work up cardiac catheterization and transesophageal echocardiogram. On his initial physical examination his blood pressure was 106/95, HR 107, RR 22, 02 saturation 97%, positive JVD, heart with S1 and S2 present and tachycardic, lungs with no wheezing or rales, abdomen not distended and positive lower extremity edema.  ? ?Na 136, K 5.0, Cl 102, bicarbonate at 25, glucose 91 bun 18, cr 1,42 ?BNP >4,500 ?Wbc 4.3, hgb 12.9 hct 41.1 plt 104 ? ?Chest radiograph with cardiomegaly, positive hilar vascular congestion and fluid in the right fissure.  ? ?EKG 93 bpm, normal axis, qtc 509, right bundle branch block, sinus rhythm with PVC, no significant ST segment or T wave changes.  ? ?CT chest with subsegmental and segmental pulmonary artery fillings in the right lower lobe. Small bilateral pleural effusions,  ?Aneurysmal dilatation of the ascending aorta.  ? ?Patient was placed on furosemide for diuresis, and anticoagulation with apixaban.  ?Frequent PVC and NSVT, placed on amiodarone and mexiletine. ? ?Cardiac catheterization with no significant coronary artery disease. Pulmonary capillary wedge pressure 3.  CO/CI 6.1/3.0 ?TEE with LV EF less than 20%, severe reduction in RV systolic function. Sever mitral regurgitation.  ? ?Plan for possible mitral valve intervention later this month. ?Patient with frequent PVC and NSVT, placed on amiodarone and mexiletine for arrhythmia control.  ? ?Plan to transfer patient to CIR.   ? ? ?Assessment and Plan: ?* Acute pulmonary embolism ([place]) ?Oxygenating at 99% on room air.  ?No chest pain. ?Continue anticoagulation with apixaban.  ? ?Acute on chronic congestive heart failure ([place]) ?Echocardiogram with LV systolic function reduced to 25%, with global hypokinesis. RV systolic function is preserved. RV size with severe dilatation, RVSP 35,6 mmHg, severe dilatation of right and left atrium. Severe mitral regurgitation.  ? ?Urine output is 500 ml over last 24 hrs, since   ?Blood pressure 93 to 97 mmHg systolic.  ?Personally reviewed telemetry, HR in 70 to 80 range, continue to have frequent PVC and NSVT.  ? ?Medical therapy with amiodarone, and mexiletine for ectopy.  ?Continue spironolactone due to hyperkalemia.  ?Blood pressure support with midodrine.  ? ?Patient has been deemed good candidate for mitral transcatheter edge to edge repair.  ? ? ?Acute kidney injury superimposed on chronic kidney disease ([place]) ?CKD stage 3a, Hyponatremia. Hypomagnesemia. Hyperkalemia  ? ?Renal function with serum cr at 1,76 with K at 4,9 and serum Na at 130. ?Continue to hold on diuretic therapy and close follow up on renal function and electrolytes.  ? ? ?Dyslipidemia ?Continue with statin therapy.  ? ?Ascending aortic aneurysm ([place]) ?CT showing aneurysmal dilation of ascending aorta measuring 4.5 cm.  ?-Radiologist recommending semi-annual imaging followup by CTA or MRA ?-Outpatient cardiothoracic surgery follow-up ? ? ? ? ?  ? ?  Subjective: Patient out of bed to the chair, complains with dyspnea on exertion but not chest pain, no nausea or vomiting.  ? ?Physical Exam: ?Vitals:  ? 05/22/21 1400 05/22/21  1450 05/22/21 2117 05/23/21 0505  ?BP: 94/68 100/60 93/65 97/72   ?Pulse: 90 90 87 85  ?Resp: 20 20 20 20   ?Temp: (!) 96.7 ?F (35.9 ?C) (!) 97.5 ?F (36.4 ?C) 97.6 ?F (36.4 ?C) 97.6 ?F (36.4 ?C)  ?TempSrc: Oral  Oral Oral  ?SpO2: 99%  98% 100%  ?Weight:    86.5 kg  ?Height:      ? ?Neurology awake and alert ?ENT with mild pallor ?Cardiovascular with S1 and S2 present with positive systolic murmur at the apex with no gallops or rubs ?Respiratory with no rales or wheezing ?Abdomen soft and not distended ?Lower extremities with trace edema  ?Data Reviewed: ? ? ? ?Family Communication: no family at the bedside  ? ?Disposition: ?Status is: Inpatient ?Remains inpatient appropriate because: pending transfer to CIR and off amiodarone drip and improved renal function  ? Planned Discharge Destination:  CIR  ? ? ? ? ?Author: ?Tawni Millers, MD ?05/23/2021 2:46 PM ? ?For on call review [email or web address]  ?

## 2021-05-23 NOTE — Care Management Important Message (Signed)
Important Message ? ?Patient Details  ?Name: Matthew [name] ?MRN: [id number] ?Date of Birth: [birth year] ? ? ?Medicare Important Message Given:  Yes ? ? ? ? ?Renie Ora ?05/23/2021, 9:07 AM ?

## 2021-05-23 NOTE — PMR Pre-admission (Signed)
PMR Admission Coordinator Pre-Admission Assessment ? ?Patient: Matthew [name] is an 81 y.o., male ?MRN: [id number] ?DOB: July 16, 1940 ?Height: 6' 7" (200.7 cm) ?Weight: 80.2 kg ? ?Insurance Information ?HMO:     PPO:      PCP:      IPA:      80/20: yes     OTHER:  ?PRIMARY: Medicare       Policy#:  [id number]     Subscriber: Pt. ?Phone#: Verified online    Fax#:  ?Pre-Cert#:       Employer:  ?Benefits:  Phone #:      Name:  ?Eff. Date: Parts A ad B effective 07/14/2005 Deduct: $1600      Out of Pocket Max:  None      Life Max: N/A  ?CIR: 100%      SNF: 100 days ?Outpatient: 80%     Co-Pay: 20% ?Home Health: 100%      Co-Pay: none ?DME: 80%     Co-Pay: 20% ?Providers: patient's choice ?Providers: in network  ? ?SECONDARY: [place]       Policy#: [id number]     Phone#:  ? ?Financial Counselor:       Phone#:  ? ?The ?Data Collection Information Summary? for patients in Inpatient Rehabilitation Facilities with attached ?Privacy Act [place] Records? was provided and verbally reviewed with: Patient and Family ? ?Emergency Contact Information ?Contact Information   ? ? Name Relation Home Work Mobile  ? Dorion, Petillo Daughter [phone]  [phone]  ? Nghia, Mcentee Son [phone]  [phone]  ? x,Brenda Sister   [phone]  ? ?  ? ?Current Medical History  ?Patient Admitting Diagnosis: Heart Failure  ? ?History of Present Illness: 81 year old male with history of CAD, nonischemic cardiomyopathy, chronic biventricular heart failure, severe mitral regurgutation, HTN, HLD and BPH. Recent admit 3/25 until 3/28 for encephalopathy due to UTI and dehydration. Recent cardiology visit with plan on repeating RHC and TEE in a few weeks. Lasix dose increased. ? ?Presented to ED on 05/13/21 for SOB and chest pains.  CTA chest with evidence of PE. BNP >4,500. He was admitted for management of PE and acute on chronic biventricular heart failure.  ? ?Cardiology consulted . Developed AKI with attempts  at diuresis. Advanced Heart Failure consulted. Started on midodrine. PICC placed for CVP and CO ox monitoring. Diuresed with IV lasix. Later developed lactic acidosis and milrinone added. Attempted to wean but developed recurrent low output.Structural heart team consulted and he underwent mTEER on 06/02/21. He was hypoxic post procedure and intubated. Eventual extubation 4/21. Still with 3+ MR post TEER but improved clinically. Weaned off milrinone on 4/24. CO ox and renal failure remained stable. Hospital course has been complicated by AKI and CKD IIIa and frequent PVC'S/NSVT for which he was started on mexiletine and amiodarone. Felt not to be a candidate for ablation. ? ?Patient's medical record from [facility] has been reviewed by the rehabilitation admission coordinator and physician. ? ?Past Medical History  ?Past Medical History:  ?Diagnosis Date  ? CAD (coronary artery disease)   ? Chronic HFrEF (heart failure with reduced ejection fraction) ([place])   ? Chronic kidney disease, stage 3b ([place])   ? Encephalopathy 04/2021  ? admitted for metabolic encephalopathy  ? HTN (hypertension)   ? Hyperlipidemia   ? NICM (nonischemic cardiomyopathy) ([place])   ? Obesity   ? Osteoarthritis   ? S/P mitral valve clip implantation 06/02/2021  ? MitraClip XTW x2  with Dr. Thukkani and Dr. Cooper  ? Severe mitral regurgitation   ? Thoracic aortic aneurysm (TAA) (H[facility])   ? Thrombocytopenia ([facility])   ? noted in labs in 2023  ? Tricuspid regurgitation   ? ?Has the patient had major surgery during 100 days prior to admission? yes ? ?Family History   ?family history includes Diabetes in his brother; Hypertension in his father. ? ?Current Medications ? ?Current Facility-Administered Medications:  ?  0.9 %  sodium chloride infusion, 250 mL, Intravenous, PRN, McDaniel, Jill D, NP, Stopped at 06/04/21 0847 ?  acetaminophen (TYLENOL) tablet 650 mg, 650 mg, Oral, Q4H PRN, Icard, Bradley L, DO ?  amiodarone (PACERONE) tablet 400  mg, 400 mg, Oral, BID, Icard, Bradley L, DO, 400 mg at 06/07/21 2216 ?  apixaban (ELIQUIS) tablet 5 mg, 5 mg, Oral, BID, Icard, Bradley L, DO, 5 mg at 06/07/21 2216 ?  atorvastatin (LIPITOR) tablet 10 mg, 10 mg, Per Tube, Daily, Icard, Bradley L, DO, 10 mg at 06/07/21 0908 ?  chlorhexidine gluconate (MEDLINE KIT) (PERIDEX) 0.12 % solution 15 mL, 15 mL, Mouth Rinse, BID, Ollis, Brandi L, NP, 15 mL at 06/07/21 2220 ?  Chlorhexidine Gluconate Cloth 2 % PADS 6 each, 6 each, Topical, Daily, McDaniel, Jill D, NP, 6 each at 06/07/21 0905 ?  docusate sodium (COLACE) capsule 100 mg, 100 mg, Oral, BID, Icard, Bradley L, DO, 100 mg at 06/07/21 2216 ?  feeding supplement (BOOST / RESOURCE BREEZE) liquid 1 Container, 1 Container, Oral, TID BM, Finch, Lindsay Nicole, PA-C, 1 Container at 06/07/21 2220 ?  fentaNYL (SUBLIMAZE) bolus via infusion 25-100 mcg, 25-100 mcg, Intravenous, Q15 min PRN, Ollis, Brandi L, NP ?  hydrALAZINE (APRESOLINE) injection 5 mg, 5 mg, Intravenous, Q20 Min PRN, McDaniel, Jill D, NP ?  labetalol (NORMODYNE) injection 10 mg, 10 mg, Intravenous, Q10 min PRN, McDaniel, Jill D, NP ?  leptospermum manuka honey (MEDIHONEY) paste 1 application., 1 application., Topical, Daily, McDaniel, Jill D, NP, 1 application. at 06/07/21 0924 ?  magnesium oxide (MAG-OX) tablet 400 mg, 400 mg, Oral, Daily, Icard, Bradley L, DO, 400 mg at 06/07/21 0909 ?  mexiletine (MEXITIL) capsule 300 mg, 300 mg, Oral, Q12H, Icard, Bradley L, DO, 300 mg at 06/07/21 2216 ?  midodrine (PROAMATINE) tablet 15 mg, 15 mg, Oral, TID WC, McLean, Dalton S, MD, 15 mg at 06/07/21 1636 ?  ondansetron (ZOFRAN) injection 4 mg, 4 mg, Intravenous, Q6H PRN, McDaniel, Jill D, NP ?  pantoprazole (PROTONIX) EC tablet 40 mg, 40 mg, Oral, Daily, Icard, Bradley L, DO, 40 mg at 06/07/21 0908 ?  polyethylene glycol (MIRALAX / GLYCOLAX) packet 17 g, 17 g, Oral, Daily, Icard, Bradley L, DO, 17 g at 06/06/21 0838 ?  senna (SENOKOT) tablet 17.2 mg, 2 tablet, Oral,  Daily, Icard, Bradley L, DO, 17.2 mg at 06/07/21 0909 ?  sodium chloride flush (NS) 0.9 % injection 10-40 mL, 10-40 mL, Intracatheter, PRN, McDaniel, Jill D, NP, 10 mL at 05/25/21 1101 ?  sodium chloride flush (NS) 0.9 % injection 10-40 mL, 10-40 mL, Intracatheter, Q12H, Icard, Bradley L, DO, 10 mL at 06/07/21 2221 ?  sodium chloride flush (NS) 0.9 % injection 10-40 mL, 10-40 mL, Intracatheter, PRN, Icard, Bradley L, DO ?  sodium chloride flush (NS) 0.9 % injection 3 mL, 3 mL, Intravenous, Q12H, McDaniel, Jill D, NP, 3 mL at 06/07/21 0908 ?  sodium chloride flush (NS) 0.9 % injection 3 mL, 3 mL, Intravenous, PRN, McDaniel, Jill D, NP ?  torsemide (  DEMADEX) tablet 40 mg, 40 mg, Oral, Daily, Larey Dresser, MD, 40 mg at 06/07/21 2505 ?  traMADol (ULTRAM) tablet 50 mg, 50 mg, Oral, Q8H PRN, Kathyrn Drown D, NP, 50 mg at 06/03/21 2033 ? ?Patients Current Diet:  ?Diet Order   ? ?       ?  Diet - low sodium heart healthy       ?  ?  Diet regular Room service appropriate? Yes with Assist; Fluid consistency: Thin  Diet effective now       ?  ? ?  ?  ? ?  ? ?Precautions / Restrictions ?Precautions ?Precautions: Fall ?Precaution Comments: L radial heart cath on 4/5 ?Restrictions ?Weight Bearing Restrictions: No  ? ?Has the patient had 2 or more falls or a fall with injury in the past year? No ? ?Prior Activity Level ?Community (5-7x/wk): Pt. active in the community PTA ? ?Prior Functional Level ?Self Care: Did the patient need help bathing, dressing, using the toilet or eating? Independent ? ?Indoor Mobility: Did the patient need assistance with walking from room to room (with or without device)? Independent ? ?Stairs: Did the patient need assistance with internal or external stairs (with or without device)? Needed some help ? ?Functional Cognition: Did the patient need help planning regular tasks such as shopping or remembering to take medications? Needed some help ? ?Patient Information ?Are you of Hispanic, Latino/a,or  Spanish origin?: A. No, not of Hispanic, Latino/a, or Spanish origin ?What is your race?: B. Black or African American ?Do you need or want an interpreter to communicate with a doctor or health care staff?:

## 2021-05-23 NOTE — Progress Notes (Signed)
Mobility Specialist Progress Note ? ? 05/23/21 1510  ?Mobility  ?Activity Transferred from chair to bed  ?Level of Assistance Minimal assist, patient does 75% or more  ?Assistive Device Front wheel [place]  ?Distance Ambulated (ft) 4 ft  ?Activity Response Tolerated well  ?$Mobility charge 1 Mobility  ? ?Received pt in chair requesting to get back to bed, stating rear was getting a little numb. Pt presents w/ heavy trunk flexion and a anterior lean following vocal cues on sequencing. Pt in bed w/ call bell in reach and bed alarm on.  ? ?Frederico Hamman ?Mobility Specialist ?Phone Number [phone] ? ?

## 2021-05-23 NOTE — Progress Notes (Signed)
Inpatient Rehab Admissions Coordinator:  ? ?Pt. Is not yet medically ready for CIR today, requiring IV amio. I will follow for potential admit once medically ready.  ? ?Megan Salon, MS, CCC-SLP ?Rehab Admissions Coordinator  ?[phone] (celll) ?[phone] (office) ? ?

## 2021-05-24 ENCOUNTER — Telehealth: Payer: Self-pay

## 2021-05-24 DIAGNOSIS — N179 Acute kidney failure, unspecified: Secondary | ICD-10-CM | POA: Diagnosis not present

## 2021-05-24 DIAGNOSIS — I5023 Acute on chronic systolic (congestive) heart failure: Secondary | ICD-10-CM | POA: Diagnosis not present

## 2021-05-24 DIAGNOSIS — I493 Ventricular premature depolarization: Secondary | ICD-10-CM | POA: Diagnosis not present

## 2021-05-24 DIAGNOSIS — I2699 Other pulmonary embolism without acute cor pulmonale: Secondary | ICD-10-CM | POA: Diagnosis not present

## 2021-05-24 DIAGNOSIS — E785 Hyperlipidemia, unspecified: Secondary | ICD-10-CM | POA: Diagnosis not present

## 2021-05-24 LAB — CBC
HCT: 37.8 % — ABNORMAL LOW (ref 39.0–52.0)
Hemoglobin: 11.9 g/dL — ABNORMAL LOW (ref 13.0–17.0)
MCH: 25.1 pg — ABNORMAL LOW (ref 26.0–34.0)
MCHC: 31.5 g/dL (ref 30.0–36.0)
MCV: 79.6 fL — ABNORMAL LOW (ref 80.0–100.0)
Platelets: 159 10*3/uL (ref 150–400)
RBC: 4.75 MIL/uL (ref 4.22–5.81)
RDW: 20.9 % — ABNORMAL HIGH (ref 11.5–15.5)
WBC: 4 10*3/uL (ref 4.0–10.5)
nRBC: 0 % (ref 0.0–0.2)

## 2021-05-24 LAB — BASIC METABOLIC PANEL
Anion gap: 8 (ref 5–15)
BUN: 30 mg/dL — ABNORMAL HIGH (ref 8–23)
CO2: 24 mmol/L (ref 22–32)
Calcium: 9.2 mg/dL (ref 8.9–10.3)
Chloride: 97 mmol/L — ABNORMAL LOW (ref 98–111)
Creatinine, Ser: 2.02 mg/dL — ABNORMAL HIGH (ref 0.61–1.24)
GFR, Estimated: 33 mL/min — ABNORMAL LOW (ref >60)
Glucose, Bld: 103 mg/dL — ABNORMAL HIGH (ref 70–99)
Potassium: 5 mmol/L (ref 3.5–5.1)
Sodium: 129 mmol/L — ABNORMAL LOW (ref 135–145)

## 2021-05-24 LAB — COOXEMETRY PANEL
Carboxyhemoglobin: 1.4 % (ref 0.5–1.5)
Methemoglobin: <0.7 % (ref 0.0–1.5)
O2 Saturation: 57.3 %
Total hemoglobin: 12.1 g/dL (ref 12.0–16.0)

## 2021-05-24 LAB — MAGNESIUM: Magnesium: 2.2 mg/dL (ref 1.7–2.4)

## 2021-05-24 MED ORDER — MIDODRINE HCL 5 MG PO TABS
10.0000 mg | ORAL_TABLET | Freq: Three times a day (TID) | ORAL | Status: DC
Start: 2021-05-24 — End: 2021-06-02
  Administered 2021-05-24 – 2021-06-01 (×26): 10 mg via ORAL
  Filled 2021-05-24 (×26): qty 2

## 2021-05-24 NOTE — Progress Notes (Signed)
?Progress Note ? ? ?Patient: Matthew [name] [id number] DOB: Nov 15, 1940 DOA: 05/13/2021     10 ?DOS: the patient was seen and examined on 05/24/2021 ?  ?Brief hospital course: ?Mr. Hammersley was admitted to the hospital with the working diagnosis of decompensated heart failure in the setting of mitral regurgitation.  ? ?81 yo male with the past medical history of CAD, heart failure, hypertension, severe mitral regurgitation, and BPH, who presented with chest pain and dyspnea. Reported acute onset of substernal/ left sided chest pain, radiated to his left arm and associated with dyspnea. Positive lower extremity edema.  ?Recent hospitalization 03/25 to 05/10/21 for urinary tract infection, complicated with metabolic encephalopathy. Outpatient follow up with cardiology 24 hrs before patient admitted this time, diuretic therapy was increased and plan for further work up with cardiac catheterization and transesophageal echocardiogram. On his initial physical examination his blood pressure was 106/95, HR 107, RR 22, 02 saturation 97%, positive JVD, heart with S1 and S2 present and tachycardic, lungs with no wheezing or rales, abdomen not distended and positive lower extremity edema.  ? ?Na 136, K 5.0, Cl 102, bicarbonate at 25, glucose 91 bun 18, cr 1,42 ?BNP >4,500 ?Wbc 4.3, hgb 12.9 hct 41.1 plt 104 ? ?Chest radiograph with cardiomegaly, positive hilar vascular congestion and fluid in the right fissure.  ? ?EKG 93 bpm, normal axis, qtc 509, right bundle branch block, sinus rhythm with PVC, no significant ST segment or T wave changes.  ? ?CT chest with subsegmental and segmental pulmonary artery fillings defects in the right lower lobe. Small bilateral pleural effusions,  ?Aneurysmal dilatation of the ascending aorta.  ? ?Patient was placed on furosemide for diuresis, and anticoagulation with apixaban.  ?Diuresis with furosemide.  ?Frequent PVC and NSVT, placed on amiodarone and mexiletine. ? ?Cardiac catheterization  with no significant coronary artery disease. Pulmonary capillary wedge pressure 3. CO/CI 6.1/3.0 ?TEE with LV EF less than 20%, severe reduction in RV systolic function. Sever mitral regurgitation.  ? ?Plan for possible mitral valve intervention later this month. ?His volume status has improved.  ? ?Plan to transfer patient to CIR.   ? ? ?Assessment and Plan: ?* Acute pulmonary embolism ([place]) ?Patient was placed on apixaban for anticoagulation with good toleration. ? ?Acute on chronic congestive heart failure ([place]) ?Patient was admitted to the cardiac ward and was placed IV furosemide for diuresis, negative fluid balance was achieved -18,204 ml, with significant improvement in his symptoms.  ? ?Echocardiogram with LV systolic function reduced to 25%, with global hypokinesis. RV systolic function is preserved. RV size with severe dilatation, RVSP 35,6 mmHg, severe dilatation of right and left atrium. Severe mitral regurgitation.  ? ?Patient with frequent PVC and NSVT, patient has been placed on amiodarone and mexiletin to suppress ectopy that possible are driving his cardiomyopathy.   ? ?Patient was placed on midodrine to support his blood pressure.   ?Continue diuretic therapy with as needed furosemide.  ?Follow with EP recommendations.  ? ?Acute kidney injury superimposed on chronic kidney disease ([place]) ?CKD stage 3a, Hyponatremia. Hypomagnesemia. Hyperkalemia  ? ?Renal function with serum cr up to 2,0 with K at 5,0 and Na 120. ?Continue close monitoring renal function and electrolytes, continue to hold on diuretic therapy.  ? ? ?Dyslipidemia ?Continue with statin therapy.  ? ?Ascending aortic aneurysm ([place]) ?CT showing aneurysmal dilation of ascending aorta measuring 4.5 cm.  ?-Radiologist recommending semi-annual imaging followup by CTA or MRA ?-Outpatient cardiothoracic surgery follow-up ? ? ? ? ?  ? ?  Subjective: Patient has mild dyspnea, no chest pain, continue to be very weak and deconditioned  ? ?Physical  Exam: ?Vitals:  ? 05/23/21 2230 05/24/21 0500 05/24/21 0514 05/24/21 0900  ?BP: 94/72  103/73 94/70  ?Pulse: 78  80 72  ?Resp: 20  20 19   ?Temp: 98.1 ?F (36.7 ?C)  98.4 ?F (36.9 ?C) (!) 97.5 ?F (36.4 ?C)  ?TempSrc: Oral  Oral Oral  ?SpO2: 100%   100%  ?Weight:  86.4 kg    ?Height:      ? ?Neurology awake and alert ?ENT with mild pallor ?Cardiovascular with S1 and S2 present positive extra beats, positive systolic murmur at the apex. No gallops ?No JVD ?Trace lower extremity edema ?Respiratory with no wheezing or rales ?Abdomen not distended  ?Data Reviewed: ? ? ? ?Family Communication: no family at the bedside  ? ?Disposition: ?Status is: Inpatient ?Remains inpatient appropriate because: hyponatremia and worsening renal function  ? Planned Discharge Destination:  CIR ? ? ? ? ? ?Author: ?Tawni Millers, MD ?05/24/2021 10:24 AM ? ?For on call review [email or web address].  ?

## 2021-05-24 NOTE — Progress Notes (Signed)
Inpatient Rehab Admissions Coordinator:  ? ?Per acute MD, HOLD CIR admission for today due to worsening renal function. I will follow for admit once medically ready. ? ?Megan Salon, MS, CCC-SLP ?Rehab Admissions Coordinator  ?[phone] (celll) ?[phone] (office) ? ?

## 2021-05-24 NOTE — Progress Notes (Signed)
OT Cancellation Note ? ?Patient Details ?Name: Matthew [name] ?MRN: [id number] ?DOB: Jul 29, 1940 ? ? ?Cancelled Treatment:    Reason Eval/Treat Not Completed: Patient declined, no reason specified (declined OOB. Will attempt another time) ? ?Zollie Ellery,HILLARY ?05/24/2021, 2:46 PM ?Maurie Boettcher, OT/L  ? ?Acute OT Clinical Specialist ?Acute Rehabilitation Services ?Pager [phone] ?Office [phone]  ?

## 2021-05-24 NOTE — Progress Notes (Addendum)
Physical Therapy Treatment ?Patient Details ?Name: Matthew [name] ?MRN: [id number] ?DOB: 09-Dec-1940 ?Today's Date: 05/24/2021 ? ? ?History of Present Illness 81 y.o. male adm 3/31 with SOB with RLL PE treated with Heparin> Eliquis. Admission 3/25-28/2023 with AMS and UTI. L radial heart cath completed on 4/5. PMHx: CAD, NICM, CHF, HTN, OA, obesity ? ?  ?PT Comments  ? ? Received pt in supine. Pt refused OOB mobility but was agreeable to perform bed mobility exercises with encouragement. Pt needing up to +2 maxA for posterior bed scooting toward HOB and minA for rolling L/R. Supine HEP given to reinforce strengthening and increased ROM. RN notified pt may benefit from Incentive Spirometer due to c/o dyspnea. Pt continues to benefit from PT services to progress toward functional mobility goals.  ?  ?Recommendations for follow up therapy are one component of a multi-disciplinary discharge planning process, led by the attending physician.  Recommendations may be updated based on patient status, additional functional criteria and insurance authorization. ? ?Follow Up Recommendations ? Acute inpatient rehab (3hours/day) ?  ?  ?Assistance Recommended at Discharge Frequent or constant Supervision/Assistance  ?Patient can return home with the following A little help with walking and/or transfers;A little help with bathing/dressing/bathroom;Assistance with cooking/housework;Assist for transportation;Help with stairs or ramp for entrance ?  ?Equipment Recommendations ? None recommended by PT  ?  ?Recommendations for Other Services   ? ? ?  ?Precautions / Restrictions Precautions ?Precautions: Other (comment) ?Precaution Comments: L radial heart cath on 4/5 ?Restrictions ?Weight Bearing Restrictions: No  ?  ? ?Mobility ? Bed Mobility ?Overal bed mobility: Needs Assistance ?Bed Mobility: Rolling ?Rolling: Min assist ?  ?  ?  ?  ?General bed mobility comments: HOB flat. Pt performed posterior supine scooting using bilateral  extremity only due to decreased UE ROM. Unable to reach overhead rails. Needed +2 maxA. ?  ?  ?Balance Overall balance assessment: Needs assistance ?  ?  ?Sitting balance - Comments:  (pt defers EOB) ?  ?  ?  ?  ?  ?  ?  ?  ?  ?  ? ?  ?Cognition Arousal/Alertness: Awake/alert ?Behavior During Therapy: [facility] for tasks assessed/performed ?Overall Cognitive Status: Within Functional Limits for tasks assessed ?   ?  ?General Comments: pt. with decreased insight into benefits of mobility. Follows simple 1-step cues with increased time. ?  ?  ? ?  ?Exercises General Exercises - Lower Extremity ?Ankle Circles/Pumps: AROM, Strengthening, Both, 10 reps, Supine ?Quad Sets: AROM, Strengthening, Both, 10 reps, Seated ?Gluteal Sets: AROM, Strengthening, Both, 5 reps, Supine ?Short Arc Quad: AROM, Strengthening, Both, 5 reps, Seated ?Heel Slides: AROM, Strengthening, Both, 5 reps, Seated ? ?  ?   ? ?Pertinent Vitals/Pain Pain Assessment ?Pain Assessment:  (no c/o pain with bed mobility)  ? ? ? ?PT Goals (current goals can now be found in the care plan section) Acute Rehab PT Goals ?Patient Stated Goal: easier breathing and to move better to go home. ?PT Goal Formulation: With patient ?Time For Goal Achievement: 05/30/21 ?Potential to Achieve Goals: Fair ?Progress towards PT goals: Progressing toward goals (slow progress due to dyspnea) ? ?  ?Frequency ? ? ? Min 3X/week ? ? ? ?  ?PT Plan Current plan remains appropriate  ? ? ?   ?AM-PAC PT "6 Clicks" Mobility   ?Outcome Measure ? Help needed turning from your back to your side while in a flat bed without using bedrails?: A Little ?Help needed moving from lying on your  back to sitting on the side of a flat bed without using bedrails?: A Little ?Help needed moving to and from a bed to a chair (including a wheelchair)?: A Little ?Help needed standing up from a chair using your arms (e.g., wheelchair or bedside chair)?: A Little ?Help needed to walk in hospital room?: A Little ?Help  needed climbing 3-5 steps with a railing? : A Lot ?6 Click Score: 17 ? ?  ?End of Session   ?Activity Tolerance: Patient tolerated treatment well;Patient limited by fatigue ?Patient left: with call bell/phone within reach;in bed;with bed alarm set ?Nurse Communication: Mobility status ?PT Visit Diagnosis: Muscle weakness (generalized) (M62.81) ?  ? ? ?Time: 7035-0093 ?PT Time Calculation (min) (ACUTE ONLY): 21 min ? ?Charges:  $Therapeutic Exercise: 8-22 mins          ?          ? ?Otilio Jefferson, SPTA ?Carly P., PTA ?Acute Rehabilitation Services ?Secure Chat Preferred 9a-5:30pm ?Office: [phone]  ? ? ? ?Tiffany Mutch ?05/24/2021, 11:57 AM ? ?

## 2021-05-24 NOTE — Progress Notes (Addendum)
? ? Advanced Heart Failure Rounding Note ? ?PCP-Cardiologist: Larae Grooms, MD  ? ?Subjective:   ? ?04/05: RHC Low filling pressures with normal CO.  ?04/06: Seen by structural team for consideration of mitral TEER ? ?Mexiletine increased yesterday. Transitioned from amio gtt>>PO. C/w frequent PVCs/bigeminy, ~25-30 PVCs/min, but less runs of NSVT.  ? ?K 5.0 ?Mg 2.2  ? ?Felt SOB last night ~7 pm. Has been off diuretics. Feels better today, currently on 3L [place]. CVP 8-9. Wt unchanged from yesterday.  ? ?SCr trending up, 1.70>>1.76>>2.02 today. BUN 30. Urine dark. BPs soft, 0000000 systolic on midodrine 5 tid.  ? ?Na low at 129.  ? ? ?cMRI completed. Limited study as patient refused to continue shortly after contrast administration. Unable to quantify volumes due to significant motion artifact on cine images likely from frequent PVCs, but visually LV is severely dilated with severe systolic dysfunction, RV is severely dilated with moderate systolic dysfunction. + Basal septal midwall late gadolinium enhancement, which is a scar pattern seen in nonischemic cardiomyopathies and associated with worse prognosis.  ? ?TEE:  Mild restriction of P2. Severe central/posterior MR due to P2 restriction and annular dilation. EF 15-20%, LV severely dilated w/ severe HK. RV dilated + severe HK. Mod TR  ? ?R/LHCAssessment: ?1. Mild non-obstructive CAD ?2. NICM EF < 20% ?3. Low filling pressures with normal cardiac output ?4. No significant v-waves in PCWP tracing despite presence of severe MR on TEE (may be influenced by low BP and filling pressures) ? ?Ao = 84/60 (70) ?LV = 83/6 ?RA = 1 ?RV = 20/5 ?PA = 21/7 (14) ?PCW = 3 (no prominent v waves) ?Fick cardiac output/index = 6.1/3.0 ?PVR = 1.8 WU ?Ao sat = 96% ?PA sat = 71%, 74% ? ? ?cMRI 05/20/21 ? ?IMPRESSION: ?1. Limited study as patient refused to continue shortly after ?contrast administration. Single shot LGE images and post contrast T1 ?maps were obtained, but high resolution LGE  images and flow ?quantification to assess mitral/tricuspid regurgitation were not ?done ?  ?2. Unable to quantify volumes due to significant motion artifact on ?cine images likely from frequent PVCs, but visually LV is severely ?dilated with severe systolic dysfunction ?  ?3. Visually RV is severely dilated with moderate systolic ?dysfunction ?  ?4. Basal septal midwall late gadolinium enhancement, which is a scar ?pattern seen in nonischemic cardiomyopathies and associated with ?worse prognosis ?  ?5. RV insertion site LGE, which is a nonspecific finding often seen ?in setting of elevated pulmonary pressures ?  ?6.  Ascending aortic aneurysm measuring 33mm ? ?Objective:   ?Weight Range: ?86.4 kg ?Body mass index is 21.46 kg/m?.  ? ?Vital Signs:   ?Temp:  [97.2 ?F (36.2 ?C)-98.4 ?F (36.9 ?C)] 97.5 ?F (36.4 ?C) (04/11 0900) ?Pulse Rate:  [72-80] 72 (04/11 0900) ?Resp:  [19-20] 19 (04/11 0900) ?BP: (94-103)/(70-86) 94/70 (04/11 0900) ?SpO2:  [100 %] 100 % (04/11 0900) ?Weight:  [86.4 kg] 86.4 kg (04/11 0500) ?Last BM Date : 05/23/21 ? ?Weight change: ?Filed Weights  ? 05/22/21 0422 05/23/21 0505 05/24/21 0500  ?Weight: 88.2 kg 86.5 kg 86.4 kg  ? ? ?Intake/Output:  ?No intake or output data in the 24 hours ending 05/24/21 0951 ?  ? ? ?Physical Exam  ? ?CVP 8  ?General:  Well appearing elderly male. No respiratory difficulty ?HEENT: normal ?Neck: supple. JVD 8-9. Carotids 2+ bilat; no bruits. No lymphadenopathy or thyromegaly appreciated. ?Cor: PMI nondisplaced. Irregular rhythm (frequent PVCs). 3/6 MR murmur  ?Lungs: decreased  BS at the bases b/l  ?Abdomen: soft, nontender, nondistended. No hepatosplenomegaly. No bruits or masses. Good bowel sounds. ?Extremities: no cyanosis, clubbing, rash, trace b/l ankle edema + RUE PICC  ?Neuro: alert & oriented x 3, cranial nerves grossly intact. moves all 4 extremities w/o difficulty. Affect pleasant. ? ? ?Telemetry  ? ?NSR 80s w/ frequent PVCs/bigeminy . No NSVT today,  personally reviewed  ? ?Labs  ?  ?CBC ?No results for input(s): WBC, NEUTROABS, HGB, HCT, MCV, PLT in the last 72 hours. ? ?Basic Metabolic Panel ?Recent Labs  ?  05/23/21 ?0454 05/23/21 ?1155 05/24/21 ?[id number]  ?NA 130*  --  129*  ?K 4.9  --  5.0  ?CL 98  --  97*  ?CO2 25  --  24  ?GLUCOSE 85  --  103*  ?BUN 26*  --  30*  ?CREATININE 1.76*  --  2.02*  ?CALCIUM 8.9  --  9.2  ?MG  --  2.3 2.2  ? ?Liver Function Tests ?No results for input(s): AST, ALT, ALKPHOS, BILITOT, PROT, ALBUMIN in the last 72 hours. ?No results for input(s): LIPASE, AMYLASE in the last 72 hours. ?Cardiac Enzymes ?No results for input(s): CKTOTAL, CKMB, CKMBINDEX, TROPONINI in the last 72 hours. ? ?BNP: ?BNP (last 3 results) ?Recent Labs  ?  05/09/21 ?[id number] 05/10/21 ?0205 05/13/21 ?2142  ?BNP >4,500.0* >4,500.0* >4,500.0*  ? ? ?ProBNP (last 3 results) ?No results for input(s): PROBNP in the last 8760 hours. ? ? ?D-Dimer ?No results for input(s): DDIMER in the last 72 hours. ?Hemoglobin A1C ?No results for input(s): HGBA1C in the last 72 hours. ?Fasting Lipid Panel ?No results for input(s): CHOL, HDL, LDLCALC, TRIG, CHOLHDL, LDLDIRECT in the last 72 hours. ?Thyroid Function Tests ?No results for input(s): TSH, T4TOTAL, T3FREE, THYROIDAB in the last 72 hours. ? ?Invalid input(s): FREET3 ? ?Other results: ? ? ?Imaging  ? ? ?No results found. ? ? ?Medications:   ? ? ?Scheduled Medications: ? amiodarone  200 mg Oral BID  ? apixaban  5 mg Oral BID  ? atorvastatin  10 mg Oral Daily  ? Chlorhexidine Gluconate Cloth  6 each Topical Daily  ? leptospermum manuka honey  1 application. Topical Daily  ? magnesium oxide  400 mg Oral Daily  ? mexiletine  300 mg Oral Q12H  ? midodrine  5 mg Oral TID WC  ? sodium chloride flush  3 mL Intravenous Q12H  ? sodium chloride flush  3 mL Intravenous Q12H  ? ? ?Infusions: ? sodium chloride    ? ? ?PRN Medications: ?sodium chloride, acetaminophen, ondansetron (ZOFRAN) IV, sodium chloride flush, sodium chloride  flush ? ? ? ?Patient Profile  ? ?81 y.o. male with history of chronic biventricular HF, NICM, CAD, severe MR/TR, HTN, obesity and multiple recent admissions, now admitted w/ acute PE and a/c CHF.  ? ?Echo this admission, EF 25%, mildly dilated RV with moderately decreased systolic function, severe MR and TR. IVC dilated.  ? ?Assessment/Plan  ? ?1. Acute on chronic Biventricular HF   ?- ECHO 04/2020 EF 25-30% with mild MR.   ?- Echo 04/04/21 EF LVEF 20-25%, RV moderately reduced, LA/RA severely dilated, and severe MR.  ?- Echo per Dr. 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 ?- cMRI c/w severe NICM w/ basal septal midwall LGE  ?- Holding diuretics w/ AKI. Will check Co-ox to r/o low output  ?- GDMT limited d/t hypotension. Increase midodrine to 10 mg TID for help w/ renal perfusion  ?- Off Spiro w/ elevated K and SCr  ? ?  ?2. CAD ?- LHC w/ mild non-obstructive CAD ?- Continue statin ?  ?3. Mitral Regurgitation ?- Severe on echo this admit and last echo 02/23 ?- TEE showed mild restriction of P2. Severe central/posterior MR due to P2 restriction and annular dilation ?- functional, severely dilated LA and LV  ?- Seen by structural team. Considering mitral TEER. Next procedure date 04/20. Need to suppress PVCs first. ?  ?4. Tricuspid Regurgitation  ?- moderate on TEE  ?- functional, severely dilated RA and RV   ?- HF optimization per above  ?  ?5. Acute PE ?- Continue Eliquis. ?- No evidence of DVT on [place] ?- ? 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  ?6. Hyperlipidemia ?- Continue  statin ?  ?7. CKD IIIa ?-  Scr  baseline around 1.4-1.7 last few months ?- Scr up today from 1.76>>2.02. Urine dark. Check Co-ox    ?- SBPs soft, will increase midodrine to 10 tid to help w/ renal perfusion ?- keep SBP > 110  ?- follow BMP  ?  ?8. NS

## 2021-05-24 NOTE — Progress Notes (Signed)
Inpatient Rehab Admissions Coordinator:    I have a CIR bed for this pt. And can admit today. RN may call report to [phone].  Ziya Coonrod, MS, CCC-SLP Rehab Admissions Coordinator  [phone] (celll) [phone] (office)  

## 2021-05-24 NOTE — H&P (Addendum)
Physical Medicine and Rehabilitation Admission H&P    CC: Debility secondary to nonischemic cardiomyopathy, chronic biventricular heart failure  HPI: Matthew [name] is an 81 year old male with a history of chronic biventricular heart failure who presented to the emergency department on 05/13/2021 for significant shortness of breath x1 hour while watching TV.  He had been evaluated that morning in the heart failure clinic and plans were in place for right and left heart catheterization and TEE in the next few weeks per Dr. Gala Romney.  He was to continue Toprol-XL, Enestro, spironolactone and [place].  He had recently been admitted with acute kidney injury on 3/26 and discharged on 3/28.  CTA of the chest was performed concerning for emboli.  Frequent PVCs monitored. He was started on heparin infusion and admitted to the medicine service for further management.   He underwent multiple imaging procedures including bilateral lower extremity venous duplex without evidence of DVT, echocardiogram with findings of EF approximately 25% and severely decreased left ventricular function, bilateral lower extremity ABIs with noncompressible vessels.  Right and left heart catheterization on 4/5 with findings of severe nonischemic cardiomyopathy.  The patient is not a candidate for MitraClip currently.  Dr. Gala Romney recommended suppressing PVCs more fully as they may be contributing to his LV dysfunction: serum potassium level maintained greater than 4 and magnesium level greater than 2.  GDMT held secondary to hypotension. Cardiology consulted, Dr. Bjorn Pippin, on 4/1.  Started on Lasix intravenously.  Serum creatinine monitored in the setting of chronic kidney disease stage IIIb.  Heparin infusion transitioned to Eliquis 5 mg twice daily.  Cardiac MRI done to rule out sarcoidosis.  NSVT and PVCs continued but improved on amiodarone infusion and mexiletine.  Electrolytes monitored. Lactic acidosis resolved with  milrinone. Repeat TEE 4/20 with estimated EF 20-25%.  He underwent transcatheter edge to edge MVR by Dr. Alverda Skeans on 4/20. He developed post-procedure hypoxia and was reintubated in the holding area. He was extubated the following day. He was transferred to heart failure service. Not a candidate for PVC ablation. Progressed with OT and PT. Tolerating diet. The patient requires inpatient medicine and rehabilitation evaluations and services for ongoing dysfunction secondary to multiple medical problems including heart failure.  Lives locally with his daughter since his wife's death approximately 6 years ago.  He has a son who lives nearby.  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  Was using rolling Varghese for assistance prior to admission. Scraped anterior right lower leg prior to admission. S/p left knee arthroscopy. Prior left upper extremity injury secondary to fall>>has limited shoulder ROM.   Review of Systems  Constitutional:  Negative for chills and fever.  HENT:  Positive for hearing loss.   Eyes:  Negative for double vision.       Wears galsses  Respiratory:  Negative for cough and shortness of breath.   Cardiovascular:  Negative for chest pain and orthopnea.  Gastrointestinal:  Negative for abdominal pain, nausea and vomiting.  Genitourinary:  Negative for dysuria.       Has condom catheter because it takes staff 30-45 minutes to help him urinate  Musculoskeletal:  Positive for back pain and joint pain.       + osteoarthritis multiple joints  Neurological:  Negative for dizziness and headaches.  Psychiatric/Behavioral:  Negative for depression. The patient is not nervous/anxious.   Past Medical History:  Diagnosis Date   CAD (coronary artery disease)    Chronic HFrEF (heart failure  with reduced ejection fraction) (HCC)    Chronic kidney disease, stage 3b (HCC)    Encephalopathy 04/2021   admitted for metabolic encephalopathy   HTN (hypertension)     Hyperlipidemia    NICM (nonischemic cardiomyopathy) (HCC)    Obesity    Osteoarthritis    Severe mitral regurgitation    Thoracic aortic aneurysm (TAA) (HCC)    Thrombocytopenia (HCC)    noted in labs in 2023   Tricuspid regurgitation    Past Surgical History:  Procedure Laterality Date   CHONDROPLASTY Left 01/11/2017   Procedure: CHONDROPLASTY of medial and patella compartment;  Surgeon: Jodi Geralds, MD;  Location[facility] OR;  Service: Orthopedics;  Laterality: Left;   KNEE ARTHROSCOPY Left 01/11/2017   Procedure: ARTHROSCOPIC IRRIGATION AND DEBRIDMENT KNEE;  Surgeon: Jodi Geralds, MD;  Location: [facility] OR;  Service: Orthopedics;  Laterality: Left;   KNEE SURGERY     MENISECTOMY Left 01/11/2017   Procedure: Lateral MENISECTOMY;  Surgeon: Jodi Geralds, MD;  Location: [facility] OR;  Service: Orthopedics;  Laterality: Left;   RIGHT/LEFT HEART CATH AND CORONARY ANGIOGRAPHY N/A 05/18/2021   Procedure: RIGHT/LEFT HEART CATH AND CORONARY ANGIOGRAPHY;  Surgeon: Dolores Patty, MD;  Location: [facility] INVASIVE CV LAB;  Service: Cardiovascular;  Laterality: N/A;   SYNOVECTOMY Left 01/11/2017   Procedure: Four compartment SYNOVECTOMY;  Surgeon: Jodi Geralds, MD;  Location: [facility] OR;  Service: Orthopedics;  Laterality: Left;   TEE WITHOUT CARDIOVERSION N/A 05/18/2021   Procedure: TRANSESOPHAGEAL ECHOCARDIOGRAM (TEE);  Surgeon: Dolores Patty, MD;  Location: [facility] ENDOSCOPY;  Service: Cardiovascular;  Laterality: N/A;   Family History  Problem Relation Age of Onset   Hypertension Father    Diabetes Brother    Heart attack Neg Hx    Stroke Neg Hx    Social History:  reports that he has never smoked. He has never used smokeless tobacco. He reports that he does not drink alcohol and does not use drugs. Allergies:  Allergies  Allergen Reactions   Lactose Intolerance (Gi) Other (See Comments)    unknown   Medications Prior to Admission  Medication Sig Dispense Refill   acetaminophen (TYLENOL) 500 MG tablet  Take 500 mg by mouth every 6 (six) hours as needed for moderate pain.     aspirin EC 81 MG tablet Take 1 tablet (81 mg total) by mouth daily. 90 tablet 3   atorvastatin (LIPITOR) 10 MG tablet TAKE 1 TABLET(10 MG) BY MOUTH DAILY (Patient taking differently: Take 10 mg by mouth daily.) 90 tablet 3   dapagliflozin propanediol (FARXIGA) 10 MG TABS tablet Take 1 tablet (10 mg total) by mouth daily. 90 tablet 3   furosemide (LASIX) 20 MG tablet Take 2 tablets (40 mg total) by mouth every morning AND 1 tablet (20 mg total) every evening. 90 tablet 2   metoprolol succinate (TOPROL-XL) 25 MG 24 hr tablet Take 1.5 tablets (37.5 mg total) by mouth at bedtime. 135 tablet 0   Misc Natural Products (PROSTATE HEALTH) CAPS Take 1 capsule by mouth in the morning and at bedtime.     sacubitril-valsartan (ENTRESTO) 24-26 MG Take 1 tablet by mouth 2 (two) times daily. 60 tablet 2   spironolactone (ALDACTONE) 25 MG tablet Take 1 tablet (25 mg total) by mouth at bedtime. 30 tablet 0   traMADol (ULTRAM) 50 MG tablet Take 50 mg by mouth every 8 (eight) hours as needed for pain.        Home: Home Living Family/patient expects to be discharged to:: Private  residence Living Arrangements: Children, Other (Comment) Available Help at Discharge: Family, Available 24 hours/day Type of Home: House Home Access: Ramped entrance Home Layout: One level Bathroom Shower/Tub: Health visitor: Standard Bathroom Accessibility: Yes Home Equipment: Rollator (4 wheels), BSC/3in1, Shower seat  Lives With: Family   Functional History: Prior Function Prior Level of Function : Needs assist Physical Assist : Mobility (physical), ADLs (physical) Mobility Comments: walks with rollator in home ADLs Comments: pt reports mod I for bathing and dressing, family performing cooking/cleaning  Functional Status:  Mobility: Bed Mobility Overal bed mobility: Needs Assistance Bed Mobility: Supine to Sit Supine to sit:  Supervision General bed mobility comments: HOB flat. Pt performed posterior supine scooting using bilateral extremity only due to decreased UE ROM. Unable to reach overhead rails. Needed +2 maxA. Transfers Overall transfer level: Needs assistance Equipment used: Rolling Tan (2 wheels) Transfers: Sit to/from Stand, Bed to chair/wheelchair/BSC Sit to Stand: Min guard, From elevated surface Bed to/from chair/wheelchair/BSC transfer type:: Step pivot Step pivot transfers: Min guard General transfer comment: Incr time and effort to rise. Min guard for safety and lines. Ambulation/Gait Ambulation/Gait assistance: Min assist Gait Distance (Feet): 60 Feet Assistive device: Rollator (4 wheels) Gait Pattern/deviations: Trunk flexed, Step-through pattern, Decreased stride length, Knee flexed in stance - right, Knee flexed in stance - left General Gait Details: Assist for balance,safety and lines. Verbal cues to stand more upright. Pt with severe forward flexion which isn't entirely new but suspect is worse with deconditioning. Gait velocity: decr Gait velocity interpretation: 1.31 - 2.62 ft/sec, indicative of limited community ambulator    ADL: ADL Overall ADL's : Needs assistance/impaired Eating/Feeding: Set up Eating/Feeding Details (indicate cue type and reason): observed to drink from cup with setup provided Grooming: Set up Upper Body Bathing: Set up, Min guard, Sitting Functional mobility during ADLs:  (will assess) General ADL Comments: Will further assess ability to complete ADL tasks; pt states he is having surgery "tomorrow".  Cognition: Cognition Overall Cognitive Status: Within Functional Limits for tasks assessed Orientation Level: Oriented to person, Oriented to place, Disoriented to time, Disoriented to situation Cognition Arousal/Alertness: Awake/alert Behavior During Therapy: WFL for tasks assessed/performed Overall Cognitive Status: Within Functional Limits for tasks  assessed General Comments: pt. with decreased insight into benefits of mobility. Follows simple 1-step cues with increased time.  Physical Exam: Blood pressure 94/70, pulse 72, temperature (!) 97.5 F (36.4 C), temperature source Oral, resp. rate 19, height 6\' 7"  (2.007 m), weight 86.4 kg, SpO2 100 %. Gen: no distress, normal appearing HEENT: oral mucosa pink and moist, NCAT, hard of hearing Cardio: Irregular rhythm Chest: normal effort, normal rate of breathing Abd: soft, non-distended Ext: no edema Psych: pleasant, normal affect Skin:    Comments: Superficial skin ulceration right anterior lower leg  RUE PICC Neurological:     General: No focal deficit present. Moving all 4 extremities    Mental Status: He is alert and oriented to person, place, and time. Inconsistent command following     Results for orders placed or performed during the hospital encounter of 05/13/21 (from the past 48 hour(s))  Basic metabolic panel     Status: Abnormal   Collection Time: 05/23/21  4:54 AM  Result Value Ref Range   Sodium 130 (L) 135 - 145 mmol/L   Potassium 4.9 3.5 - 5.1 mmol/L   Chloride 98 98 - 111 mmol/L   CO2 25 22 - 32 mmol/L   Glucose, Bld 85 70 - 99 mg/dL  Comment: Glucose reference range applies only to samples taken after fasting for at least 8 hours.   BUN 26 (H) 8 - 23 mg/dL   Creatinine, Ser 2.95 (H) 0.61 - 1.24 mg/dL   Calcium 8.9 8.9 - 62.1 mg/dL   GFR, Estimated 39 (L) >60 mL/min    Comment: (NOTE) Calculated using the CKD-EPI Creatinine Equation (2021)    Anion gap 7 5 - 15    Comment: Performed at [facility], [address] [place], [place] [address]  Magnesium     Status: None   Collection Time: 05/23/21 11:55 AM  Result Value Ref Range   Magnesium 2.3 1.7 - 2.4 mg/dL    Comment: Performed at [facility], [address] [place], [place] [address]  Vitamin B12     Status: None   Collection Time: 05/23/21 11:55 AM  Result Value Ref Range    Vitamin B-12 308 180 - 914 pg/mL    Comment: (NOTE) This assay is not validated for testing neonatal or myeloproliferative syndrome specimens for Vitamin B12 levels. Performed at [facility], [address] [place], [place] [address]   Folate     Status: None   Collection Time: 05/23/21 11:55 AM  Result Value Ref Range   Folate 11.1 >5.9 ng/mL    Comment: Performed at [facility], [address] [place], [place] [address]  Iron and TIBC     Status: Abnormal   Collection Time: 05/23/21 11:55 AM  Result Value Ref Range   Iron 28 (L) 45 - 182 ug/dL   TIBC 440 102 - 725 ug/dL   Saturation Ratios 10 (L) 17.9 - 39.5 %   UIBC 246 ug/dL    Comment: Performed at [facility], [address] [place], [place] [address]  Ferritin     Status: None   Collection Time: 05/23/21 11:55 AM  Result Value Ref Range   Ferritin 327 24 - 336 ng/mL    Comment: Performed at [facility], [address] [place], [place] [address]  Reticulocytes     Status: None   Collection Time: 05/23/21 11:55 AM  Result Value Ref Range   Retic Ct Pct 0.8 0.4 - 3.1 %   RBC. 4.75 4.22 - 5.81 MIL/uL   Retic Count, Absolute 37.0 19.0 - 186.0 K/uL   Immature Retic Fract 9.5 2.3 - 15.9 %    Comment: Performed at [facility], [address] [place], [place] [address]  Basic metabolic panel     Status: Abnormal   Collection Time: 05/24/21  5:31 AM  Result Value Ref Range   Sodium 129 (L) 135 - 145 mmol/L   Potassium 5.0 3.5 - 5.1 mmol/L   Chloride 97 (L) 98 - 111 mmol/L   CO2 24 22 - 32 mmol/L   Glucose, Bld 103 (H) 70 - 99 mg/dL    Comment: Glucose reference range applies only to samples taken after fasting for at least 8 hours.   BUN 30 (H) 8 - 23 mg/dL   Creatinine, Ser 3.87 (H) 0.61 - 1.24 mg/dL   Calcium 9.2 8.9 - 56.4 mg/dL   GFR, Estimated 33 (L) >60 mL/min    Comment: (NOTE) Calculated using the CKD-EPI Creatinine Equation (2021)    Anion gap 8 5 - 15    Comment:  Performed at [facility], [address], [place], [place] [address]  Magnesium  Status: None   Collection Time: 05/24/21  5:31 AM  Result Value Ref Range   Magnesium 2.2 1.7 - 2.4 mg/dL    Comment: Performed at [facility], [address] [place], [place] [address]  CBC     Status: Abnormal   Collection Time: 05/24/21 11:12 AM  Result Value Ref Range   WBC 4.0 4.0 - 10.5 K/uL   RBC 4.75 4.22 - 5.81 MIL/uL   Hemoglobin 11.9 (L) 13.0 - 17.0 g/dL   HCT 32.4 (L) 40.1 - 02.7 %   MCV 79.6 (L) 80.0 - 100.0 fL   MCH 25.1 (L) 26.0 - 34.0 pg   MCHC 31.5 30.0 - 36.0 g/dL   RDW 25.3 (H) 66.4 - 40.3 %   Platelets 159 150 - 400 K/uL   nRBC 0.0 0.0 - 0.2 %    Comment: Performed at [facility], [address], [place], [place] [address]   No results found.    Blood pressure 94/70, pulse 72, temperature (!) 97.5 F (36.4 C), temperature source Oral, resp. rate 19, height 6\' 7"  (2.007 m), weight 86.4 kg, SpO2 100 %.  Medical Problem List and Plan: 1. Functional deficits secondary to heart failure  -patient may shower  -ELOS/Goals: S 10-14 days  -Admit to CIR 2.  Antithrombotics: -DVT/anticoagulation:  Pharmaceutical: Other (comment)Eliquis 5 mg BID  -antiplatelet therapy: none 3. Pain Management: Tylenol as needed 4. Mood: LCSW to evaluate and provide emotional support  -antipsychotic agents: n/a 5. Neuropsych: This patient is capable of making decisions on his own behalf. 6. Skin/Wound Care: Routine skin care checks  -- continue local wound care to RLE with [facility] 7. Fluids/Electrolytes/Nutrition: Strict Is and Os and follow-up chemistries + mag in AM  --per HF team>>K+ greater than 4 and mag greater than 2 8: Nonischemic cardiomyopathy 9: Acute on chronic biventricular heart failure (off Toprol Xl due to soft BP)  --daily weight  --continue torsemide 40 mg daily -- continue midodrine 15 mg 3 times daily -- continue amiodarone 400 mg twice  daily --mexilitine 300 mg BID -- Mag-Ox 400 mg daily -daily weights 10: Valvular heart disease: s/p TEER MVR, still with 3+ MR --tricuspid regurg>>moderate on TEE 11: Acute pulmonary embolism: --continue Eliquis 5 mg twice daily 12: Acute kidney injury atop chronic kidney disease stage IIIb; serum Cr is baseline 1.4-1.7  --current Cr 2.1>>follow-up BMP --off spironolactone 13: Dyslipidemia: -- continue Lipitor 10 mg daily 14: Ascending aortic aneurysm -- follow-up CT surgery as outpatient 15: CAD: Left heart cath with mild nonobstructive CAD, continue statin 16: NSVT/PVCs: Goal to keep potassium greater than 4 and magnesium greater than 2 --heart meds as above #9 --not ablation candidate 17: Right lower extremity wound: Local wound care  -- Bilateral ABIs noncompressible 18: Hyponatremia: serum sodium 134; limit free water>>follow-up BMP 19: Hyperkalemia currently resolved>>follow-up BMP  --keep K+ greater than 4  I have personally performed a face to face diagnostic evaluation, including, but not limited to relevant history and physical exam findings, of this patient and developed relevant assessment and plan.  Additionally, I have reviewed and concur with the physician assistant's documentation above.  Sula Soda, MD  Milinda Antis, PA-C 05/24/2021

## 2021-05-24 NOTE — Telephone Encounter (Signed)
For patient CW: This is secondary MR and does not require a surgical consult ? ?This looks like a clippable valve. The fossa looks approachable for transseptal puncture in the SAXB and Bicaval views. LA dimensions are large enough for device steering and straddle. The MR jet is broad based and centrally located. The posterior leaflet measures over 2 cm with MPR. MVA measurement is 9.67 cm2 and gradient measures 1 mmHg (89 bpm). Based on the functional nature of the MR, I'd start with an NTW and assess for gradient. I'd plan for two clips. ? ? ? ? ?

## 2021-05-24 NOTE — TOC CM/SW Note (Signed)
HF TOC CM notified [facility] rep, Victorino Dike, plan is for dc to IP rehab. They will continue to follow pt once dc from CIR for any HH needs. Isidoro Donning RN3 CCM, Heart Failure TOC CM [phone]  ?

## 2021-05-25 ENCOUNTER — Ambulatory Visit: Payer: Medicare PPO | Admitting: Family Medicine

## 2021-05-25 ENCOUNTER — Inpatient Hospital Stay (HOSPITAL_COMMUNITY): Payer: Medicare Other

## 2021-05-25 DIAGNOSIS — I5023 Acute on chronic systolic (congestive) heart failure: Secondary | ICD-10-CM | POA: Diagnosis not present

## 2021-05-25 DIAGNOSIS — E785 Hyperlipidemia, unspecified: Secondary | ICD-10-CM | POA: Diagnosis not present

## 2021-05-25 DIAGNOSIS — N179 Acute kidney failure, unspecified: Secondary | ICD-10-CM | POA: Diagnosis not present

## 2021-05-25 DIAGNOSIS — I2699 Other pulmonary embolism without acute cor pulmonale: Secondary | ICD-10-CM | POA: Diagnosis not present

## 2021-05-25 DIAGNOSIS — I493 Ventricular premature depolarization: Secondary | ICD-10-CM | POA: Diagnosis not present

## 2021-05-25 LAB — BASIC METABOLIC PANEL
Anion gap: 13 (ref 5–15)
Anion gap: 12 (ref 5–15)
BUN: 34 mg/dL — ABNORMAL HIGH (ref 8–23)
BUN: 35 mg/dL — ABNORMAL HIGH (ref 8–23)
CO2: 18 mmol/L — ABNORMAL LOW (ref 22–32)
CO2: 22 mmol/L (ref 22–32)
Calcium: 9.3 mg/dL (ref 8.9–10.3)
Calcium: 9.5 mg/dL (ref 8.9–10.3)
Chloride: 100 mmol/L (ref 98–111)
Chloride: 97 mmol/L — ABNORMAL LOW (ref 98–111)
Creatinine, Ser: 2.1 mg/dL — ABNORMAL HIGH (ref 0.61–1.24)
Creatinine, Ser: 2.39 mg/dL — ABNORMAL HIGH (ref 0.61–1.24)
GFR, Estimated: 31 mL/min — ABNORMAL LOW (ref >60)
GFR, Estimated: 27 mL/min — ABNORMAL LOW (ref >60)
Glucose, Bld: 76 mg/dL (ref 70–99)
Glucose, Bld: 123 mg/dL — ABNORMAL HIGH (ref 70–99)
Potassium: 5.7 mmol/L — ABNORMAL HIGH (ref 3.5–5.1)
Potassium: 5.5 mmol/L — ABNORMAL HIGH (ref 3.5–5.1)
Sodium: 131 mmol/L — ABNORMAL LOW (ref 135–145)
Sodium: 131 mmol/L — ABNORMAL LOW (ref 135–145)

## 2021-05-25 LAB — URINALYSIS, ROUTINE W REFLEX MICROSCOPIC
Bilirubin Urine: NEGATIVE
Glucose, UA: NEGATIVE mg/dL
Ketones, ur: NEGATIVE mg/dL
Nitrite: NEGATIVE
Protein, ur: NEGATIVE mg/dL
Specific Gravity, Urine: 1.008 (ref 1.005–1.030)
pH: 5 (ref 5.0–8.0)

## 2021-05-25 LAB — CBC
HCT: 39.5 % (ref 39.0–52.0)
Hemoglobin: 12.8 g/dL — ABNORMAL LOW (ref 13.0–17.0)
MCH: 26.1 pg (ref 26.0–34.0)
MCHC: 32.4 g/dL (ref 30.0–36.0)
MCV: 80.6 fL (ref 80.0–100.0)
Platelets: 167 10*3/uL (ref 150–400)
RBC: 4.9 MIL/uL (ref 4.22–5.81)
RDW: 21 % — ABNORMAL HIGH (ref 11.5–15.5)
WBC: 4 10*3/uL (ref 4.0–10.5)
nRBC: 0 % (ref 0.0–0.2)

## 2021-05-25 LAB — COOXEMETRY PANEL
Carboxyhemoglobin: 1.7 % — ABNORMAL HIGH (ref 0.5–1.5)
Carboxyhemoglobin: 1.5 % (ref 0.5–1.5)
Methemoglobin: <0.7 % (ref 0.0–1.5)
Methemoglobin: <0.7 % (ref 0.0–1.5)
O2 Saturation: 64.7 %
O2 Saturation: 65.5 %
Total hemoglobin: 12.6 g/dL (ref 12.0–16.0)
Total hemoglobin: 12.6 g/dL (ref 12.0–16.0)

## 2021-05-25 LAB — LACTIC ACID, PLASMA
Lactic Acid, Venous: 2.8 mmol/L (ref 0.5–1.9)
Lactic Acid, Venous: 1.7 mmol/L (ref 0.5–1.9)
Lactic Acid, Venous: 1.6 mmol/L (ref 0.5–1.9)

## 2021-05-25 LAB — PROCALCITONIN: Procalcitonin: <0.1 ng/mL

## 2021-05-25 LAB — MAGNESIUM: Magnesium: 2.3 mg/dL (ref 1.7–2.4)

## 2021-05-25 IMAGING — DX DG CHEST 1V PORT
1 series · 1 of 1 positions shown · non-contrast
Comparison: Chest x-ray dated [DATE].

CLINICAL DATA: Shortness of breath.

EXAM:
PORTABLE CHEST 1 VIEW

[chest ap]
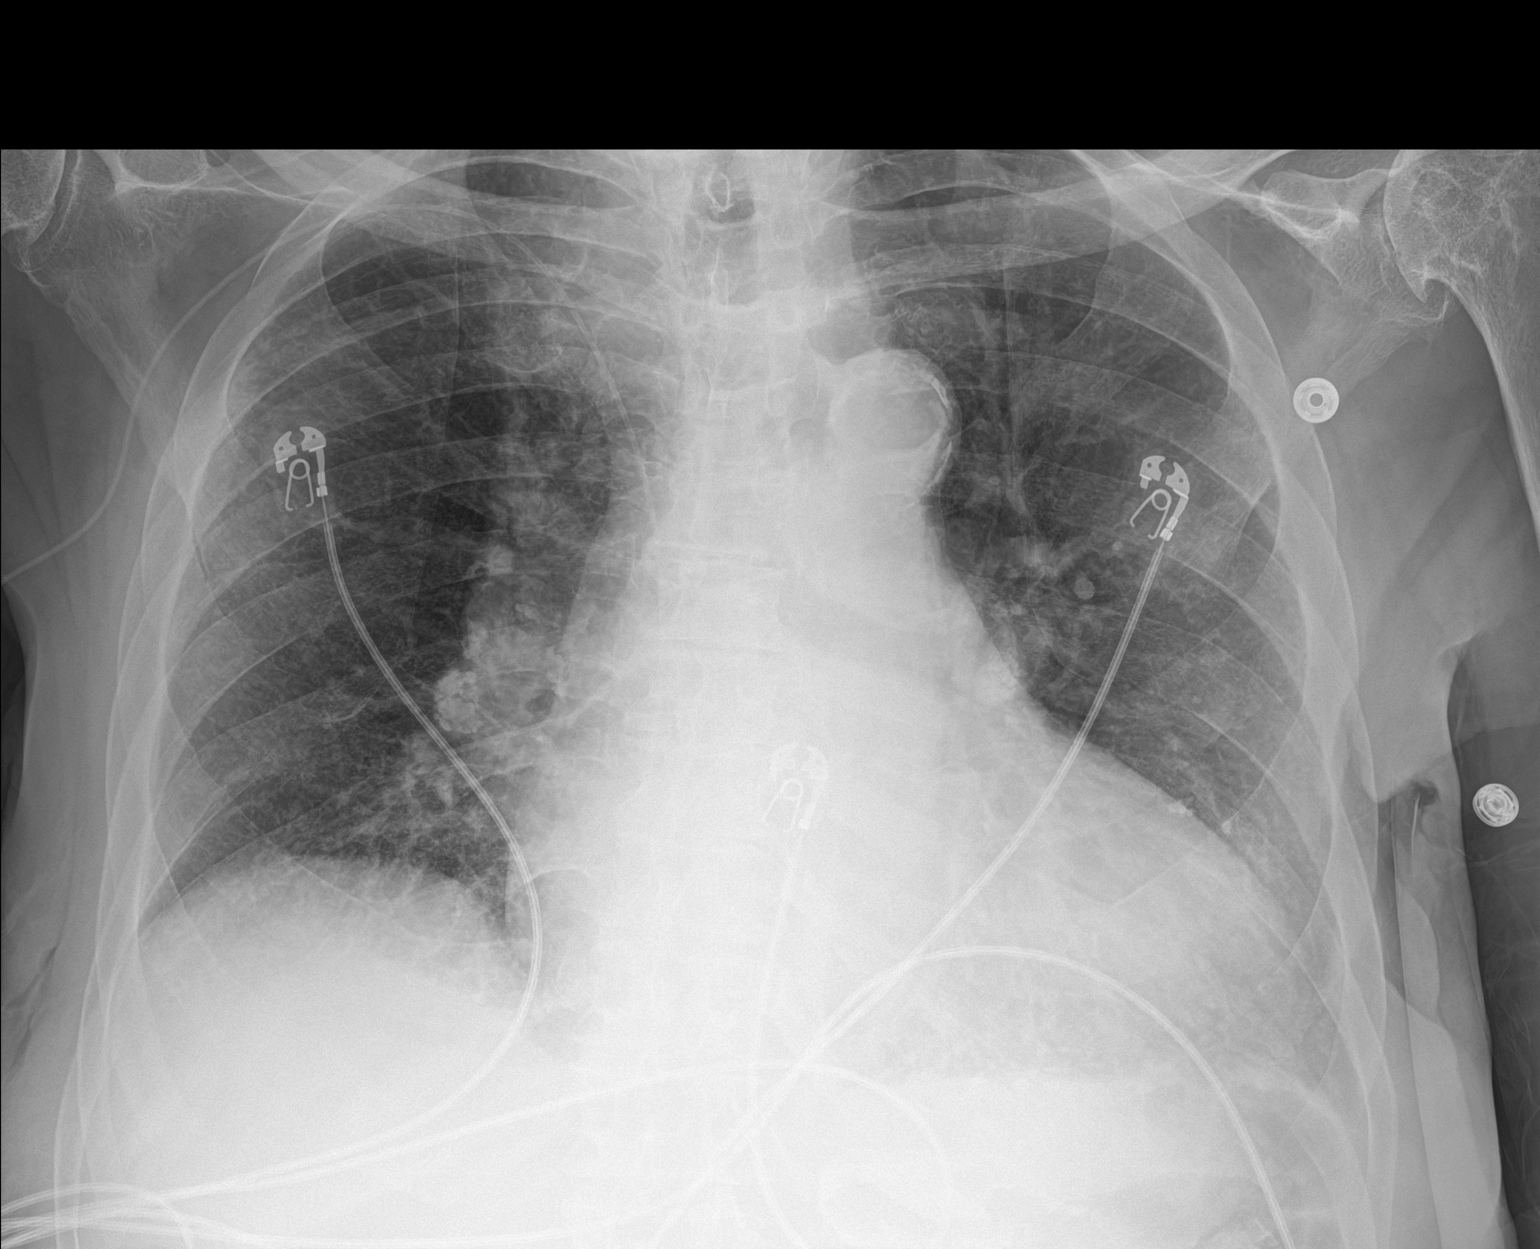

[1 of 1 positions shown; findings below may reference images not displayed]

FINDINGS: New right upper extremity PICC line with tip in the proximal right
atrium. Stable cardiomegaly. Normal pulmonary vascularity. No
consolidation, pneumothorax, or large pleural effusion. No acute
osseous abnormality.
IMPRESSION: 1. No active disease.

## 2021-05-25 MED ORDER — DOCUSATE SODIUM 100 MG PO CAPS
100.0000 mg | ORAL_CAPSULE | Freq: Every day | ORAL | Status: DC
  Administered 2021-05-25 – 2021-05-30 (×6): 100 mg via ORAL
  Filled 2021-05-25 (×6): qty 1

## 2021-05-25 MED ORDER — SODIUM ZIRCONIUM CYCLOSILICATE 10 G PO PACK
10.0000 g | PACK | Freq: Two times a day (BID) | ORAL | Status: DC
  Administered 2021-05-25 – 2021-05-26 (×3): 10 g via ORAL
  Filled 2021-05-25 (×3): qty 1

## 2021-05-25 MED ORDER — AMIODARONE HCL 200 MG PO TABS
400.0000 mg | ORAL_TABLET | Freq: Two times a day (BID) | ORAL | Status: DC
  Administered 2021-05-25 – 2021-06-02 (×16): 400 mg via ORAL
  Filled 2021-05-25 (×17): qty 2

## 2021-05-25 MED ORDER — MILRINONE LACTATE IN DEXTROSE 20-5 MG/100ML-% IV SOLN
0.1250 ug/kg/min | INTRAVENOUS | Status: DC
  Administered 2021-05-25 – 2021-05-29 (×4): 0.125 ug/kg/min via INTRAVENOUS
  Filled 2021-05-25 (×4): qty 100

## 2021-05-25 MED ORDER — SENNA 8.6 MG PO TABS
1.0000 | ORAL_TABLET | Freq: Once | ORAL | Status: AC
  Administered 2021-05-25: 8.6 mg via ORAL
  Filled 2021-05-25: qty 1

## 2021-05-25 MED ORDER — FUROSEMIDE 10 MG/ML IJ SOLN
40.0000 mg | Freq: Once | INTRAMUSCULAR | Status: AC
  Administered 2021-05-25: 40 mg via INTRAVENOUS
  Filled 2021-05-25: qty 4

## 2021-05-25 NOTE — Progress Notes (Signed)
Notified by lab of critically high lactic acid of 2.8; sent message to Dr. Ella Jubilee and spoke with Anna Genre PA w/ heart failure team.  ?

## 2021-05-25 NOTE — Progress Notes (Signed)
Physical Therapy Treatment ?Patient Details ?Name: Matthew [name] ?MRN: [id number] ?DOB: [birth year] ?Today's Date: 05/25/2021 ? ? ?History of Present Illness 81 y.o. male adm 3/31 with SOB with RLL PE treated with Heparin> Eliquis. Admission 3/25-28/2023 with AMS and UTI. L radial heart cath completed on 4/5. PMHx: CAD, NICM, CHF, HTN, OA, obesity ? ?  ?PT Comments  ? ? Session focused on attempting to ambulate with a more upright posture via using a bil platform Fernando and providing verbal and tactile cues, but min success. Pt demonstrating significant deficits in balance and quads strength, resulting in pt demonstrating knee buckling when trying to transition his hands off the platform [place] to turn to sit in it, needing modAx2 to recover. Stood x2 additional reps to attempt to facilitate upright posture and quads/lower extremity muscular activation. Will continue to follow acutely. Current recommendations remain appropriate. ?   ?Recommendations for follow up therapy are one component of a multi-disciplinary discharge planning process, led by the attending physician.  Recommendations may be updated based on patient status, additional functional criteria and insurance authorization. ? ?Follow Up Recommendations ? Acute inpatient rehab (3hours/day) ?  ?  ?Assistance Recommended at Discharge Frequent or constant Supervision/Assistance  ?Patient can return home with the following A little help with bathing/dressing/bathroom;Assistance with cooking/housework;Assist for transportation;Help with stairs or ramp for entrance;A lot of help with walking and/or transfers ?  ?Equipment Recommendations ? None recommended by PT  ?  ?Recommendations for Other Services   ? ? ?  ?Precautions / Restrictions Precautions ?Precautions: Other (comment);Fall ?Precaution Comments: L radial heart cath on 4/5 ?Restrictions ?Weight Bearing Restrictions: No  ?  ? ?Mobility ? Bed Mobility ?Overal bed mobility: Needs Assistance ?Bed Mobility:  Supine to Sit ?  ?  ?Supine to sit: Min guard, HOB elevated ?  ?  ?General bed mobility comments: HOB slightly elevated, extra time and min guard for safety, cuing to reduce use of L UE. ?  ? ?Transfers ?Overall transfer level: Needs assistance ?Equipment used: Bilateral platform Cormier ?Transfers: Sit to/from Stand ?Sit to Stand: Min assist, +2 physical assistance, +2 safety/equipment ?  ?  ?  ?  ?  ?General transfer comment: Sit to stand 1x from EOB with minA, +2 for safety. SIt to stand 2x from platform Eichenberger with minAx2 physically and for safety, needing cues for hand placement. ?  ? ?Ambulation/Gait ?Ambulation/Gait assistance: Min assist, Mod assist, +2 physical assistance, +2 safety/equipment ?Gait Distance (Feet): 40 Feet ?Assistive device: Bilateral platform Lalor ?Gait Pattern/deviations: Trunk flexed, Step-through pattern, Decreased stride length, Knee flexed in stance - right, Knee flexed in stance - left, Knees buckling, Decreased dorsiflexion - left ?Gait velocity: reduced ?Gait velocity interpretation: <1.31 ft/sec, indicative of household ambulator ?  ?General Gait Details: Pt with slow, very flexed posture despite use of bil platform Connors to facilitate upright posture. Verbal and tactile cues also provided to improve posture with min success. Pt with inability to perform L heel strike and get foot flat during gait. MinAx2 for balance and safety initially. When pt attempted to turn around to sit in platform Fajardo, his knees buckled and he required modAx2 to recover and safely transition pt to the seat of the Blaney. ? ? ?Stairs ?  ?  ?  ?  ?  ? ? ?Wheelchair Mobility ?  ? ?Modified Rankin (Stroke Patients Only) ?  ? ? ?  ?Balance Overall balance assessment: Needs assistance ?Sitting-balance support: Feet supported ?Sitting balance-[name] Scale: Good ?  ?  ?  Standing balance support: Bilateral upper extremity supported, During functional activity, Reliant on assistive device for balance ?Standing  balance-[name] Scale: Poor ?Standing balance comment: Reliant on Salvas and minAx1-2. When trying to transition hands off [place] or sturdy surface, his knees would buckle and he would need modAx2 to recover ?  ?  ?  ?  ?  ?  ?  ?  ?  ?  ?  ?  ? ?  ?Cognition Arousal/Alertness: Awake/alert ?Behavior During Therapy: [facility] for tasks assessed/performed ?Overall Cognitive Status: Within Functional Limits for tasks assessed ?  ?  ?  ?  ?  ?  ?  ?  ?  ?  ?  ?  ?  ?  ?  ?  ?General Comments: Pt HOH, possibly causing some of his slow processing time. Difficulty sequencing and problem-solving UE placement with turns, impacting his safety. Potentially pt's baseline though. ?  ?  ? ?  ?Exercises   ? ?  ?General Comments General comments (skin integrity, edema, etc.): VSS ?  ?  ? ?Pertinent Vitals/Pain Pain Assessment ?Pain Assessment: Faces ?Faces Pain Scale: Hurts a little bit ?Pain Location: L arm ?Pain Descriptors / Indicators: Discomfort, Operative site guarding ?Pain Intervention(s): Limited activity within patient's tolerance, Monitored during session, Repositioned  ? ? ?Home Living   ?  ?  ?  ?  ?  ?  ?  ?  ?  ?   ?  ?Prior Function    ?  ?  ?   ? ?PT Goals (current goals can now be found in the care plan section) Acute Rehab PT Goals ?Patient Stated Goal: to get better ?PT Goal Formulation: With patient ?Time For Goal Achievement: 05/30/21 ?Potential to Achieve Goals: Fair ?Progress towards PT goals: Progressing toward goals ? ?  ?Frequency ? ? ? Min 3X/week ? ? ? ?  ?PT Plan Current plan remains appropriate  ? ? ?Co-evaluation PT/OT/SLP Co-Evaluation/Treatment: Yes ?Reason for Co-Treatment: For patient/therapist safety;To address functional/ADL transfers ?PT goals addressed during session: Mobility/safety with mobility;Balance;Proper use of DME ?  ?  ? ?  ?AM-PAC PT "6 Clicks" Mobility   ?Outcome Measure ? Help needed turning from your back to your side while in a flat bed without using bedrails?: A Little ?Help needed  moving from lying on your back to sitting on the side of a flat bed without using bedrails?: A Little ?Help needed moving to and from a bed to a chair (including a wheelchair)?: A Lot ?Help needed standing up from a chair using your arms (e.g., wheelchair or bedside chair)?: A Lot ?Help needed to walk in hospital room?: A Lot ?Help needed climbing 3-5 steps with a railing? : Total ?6 Click Score: 13 ? ?  ?End of Session Equipment Utilized During Treatment: Gait belt ?Activity Tolerance: Patient tolerated treatment well;Patient limited by fatigue ?Patient left: with call bell/phone within reach;in chair;with chair alarm set ?Nurse Communication: Mobility status;Other (comment) (knees buckling) ?PT Visit Diagnosis: Muscle weakness (generalized) (M62.81);Unsteadiness on feet (R26.81);Other abnormalities of gait and mobility (R26.89);Difficulty in walking, not elsewhere classified (R26.2) ?  ? ? ?Time: [id number] ?PT Time Calculation (min) (ACUTE ONLY): 30 min ? ?Charges:  $Gait Training: 8-22 mins          ?          ? ?Moishe Spice, PT, DPT ?Acute Rehabilitation Services  ?Pager: [phone] ?Office: [phone] ? ? ? ?Maretta Bees Pettis ?05/25/2021, 8:37 AM ? ?

## 2021-05-25 NOTE — Progress Notes (Addendum)
? ? Advanced Heart Failure Rounding Note ? ?PCP-Cardiologist: Larae Grooms, MD  ? ?Subjective:   ? ?04/05: RHC Low filling pressures with normal CO.  ?04/06: Seen by structural team for consideration of mitral TEER ? ?Co-ox 64.7%. ? ?Scr continues to trend up, 1.70>1.76>2.02>2.10. CO2 24>18. Holding diuretics. BP stable. ? ?K 5.7 and mag 2.3.  ? ?Continues with frequent PVCs ~ 30/min despite po amiodarone and mexiletine. ? ?Feels okay. Denies dyspnea. Reports having trouble with bowel movements. ? ?cMRI completed. Limited study as patient refused to continue shortly after contrast administration. Unable to quantify volumes due to significant motion artifact on cine images likely from frequent PVCs, but visually LV is severely dilated with severe systolic dysfunction, RV is severely dilated with moderate systolic dysfunction. + Basal septal midwall late gadolinium enhancement, which is a scar pattern seen in nonischemic cardiomyopathies and associated with worse prognosis.  ? ?TEE:  Mild restriction of P2. Severe central/posterior MR due to P2 restriction and annular dilation. EF 15-20%, LV severely dilated w/ severe HK. RV dilated + severe HK. Mod TR  ? ?R/LHCAssessment: ?1. Mild non-obstructive CAD ?2. NICM EF < 20% ?3. Low filling pressures with normal cardiac output ?4. No significant v-waves in PCWP tracing despite presence of severe MR on TEE (may be influenced by low BP and filling pressures) ? ?Ao = 84/60 (70) ?LV = 83/6 ?RA = 1 ?RV = 20/5 ?PA = 21/7 (14) ?PCW = 3 (no prominent v waves) ?Fick cardiac output/index = 6.1/3.0 ?PVR = 1.8 WU ?Ao sat = 96% ?PA sat = 71%, 74% ? ? ?cMRI 05/20/21 ? ?IMPRESSION: ?1. Limited study as patient refused to continue shortly after ?contrast administration. Single shot LGE images and post contrast T1 ?maps were obtained, but high resolution LGE images and flow ?quantification to assess mitral/tricuspid regurgitation were not ?done ?  ?2. Unable to quantify volumes due to  significant motion artifact on ?cine images likely from frequent PVCs, but visually LV is severely ?dilated with severe systolic dysfunction ?  ?3. Visually RV is severely dilated with moderate systolic ?dysfunction ?  ?4. Basal septal midwall late gadolinium enhancement, which is a scar ?pattern seen in nonischemic cardiomyopathies and associated with ?worse prognosis ?  ?5. RV insertion site LGE, which is a nonspecific finding often seen ?in setting of elevated pulmonary pressures ?  ?6.  Ascending aortic aneurysm measuring 74mm ? ?Objective:   ?Weight Range: ?86.2 kg ?Body mass index is 21.41 kg/m?.  ? ?Vital Signs:   ?Temp:  [97.6 ?F (36.4 ?C)-98.3 ?F (36.8 ?C)] 97.6 ?F (36.4 ?C) (04/12 0556) ?Pulse Rate:  [68-77] 75 (04/12 0751) ?Resp:  [14-20] 14 (04/12 0751) ?BP: (108-116)/(84-91) 116/90 (04/12 0751) ?SpO2:  [97 %-100 %] 100 % (04/12 0751) ?Weight:  [86.2 kg] 86.2 kg (04/12 0419) ?Last BM Date : 05/23/21 ? ?Weight change: ?Filed Weights  ? 05/23/21 0505 05/24/21 0500 05/25/21 0419  ?Weight: 86.5 kg 86.4 kg 86.2 kg  ? ? ?Intake/Output:  ? ?Intake/Output Summary (Last 24 hours) at 05/25/2021 1057 ?Last data filed at 05/24/2021 1647 ?Gross per 24 hour  ?Intake --  ?Output 400 ml  ?Net -400 ml  ? ?  ? ? ?Physical Exam  ? ?CVP 12-13 ?General:  No distress. Lying in bed. ?HEENT: normal ?Neck: supple. JVP 12-14. Carotids 2+ bilat; no bruits.  appreciated. ?Cor: PMI nondisplaced. Regular rate & rhythm with ectopy. No rubs, gallops, 2/6 MR mumur ?Lungs: clear ?Abdomen: soft, nontender, nondistended.  ?Extremities: no cyanosis, clubbing, rash, edema, +  UNNA ?Neuro: alert & orientedx3, cranial nerves grossly intact. moves all 4 extremities w/o difficulty. Affect pleasant ? ? ? ?Telemetry  ? ?SR 80s, frequent PVCs, ~ 30/min, less runs NSVT ? ?Labs  ?  ?CBC ?Recent Labs  ?  05/24/21 ?1112  ?WBC 4.0  ?HGB 11.9*  ?HCT 37.8*  ?MCV 79.6*  ?PLT 159  ? ? ?Basic Metabolic Panel ?Recent Labs  ?  05/24/21 ?[id number] 05/25/21 ?0318  ?NA  129* 131*  ?K 5.0 5.7*  ?CL 97* 100  ?CO2 24 18*  ?GLUCOSE 103* 76  ?BUN 30* 34*  ?CREATININE 2.02* 2.10*  ?CALCIUM 9.2 9.3  ?MG 2.2 2.3  ? ?Liver Function Tests ?No results for input(s): AST, ALT, ALKPHOS, BILITOT, PROT, ALBUMIN in the last 72 hours. ?No results for input(s): LIPASE, AMYLASE in the last 72 hours. ?Cardiac Enzymes ?No results for input(s): CKTOTAL, CKMB, CKMBINDEX, TROPONINI in the last 72 hours. ? ?BNP: ?BNP (last 3 results) ?Recent Labs  ?  05/09/21 ?[id number] 05/10/21 ?0205 05/13/21 ?2142  ?BNP >4,500.0* >4,500.0* >4,500.0*  ? ? ?ProBNP (last 3 results) ?No results for input(s): PROBNP in the last 8760 hours. ? ? ?D-Dimer ?No results for input(s): DDIMER in the last 72 hours. ?Hemoglobin A1C ?No results for input(s): HGBA1C in the last 72 hours. ?Fasting Lipid Panel ?No results for input(s): CHOL, HDL, LDLCALC, TRIG, CHOLHDL, LDLDIRECT in the last 72 hours. ?Thyroid Function Tests ?No results for input(s): TSH, T4TOTAL, T3FREE, THYROIDAB in the last 72 hours. ? ?Invalid input(s): FREET3 ? ?Other results: ? ? ?Imaging  ? ? ?No results found. ? ? ?Medications:   ? ? ?Scheduled Medications: ? amiodarone  200 mg Oral BID  ? apixaban  5 mg Oral BID  ? atorvastatin  10 mg Oral Daily  ? Chlorhexidine Gluconate Cloth  6 each Topical Daily  ? leptospermum manuka honey  1 application. Topical Daily  ? magnesium oxide  400 mg Oral Daily  ? mexiletine  300 mg Oral Q12H  ? midodrine  10 mg Oral TID WC  ? sodium chloride flush  3 mL Intravenous Q12H  ? sodium chloride flush  3 mL Intravenous Q12H  ? sodium zirconium cyclosilicate  10 g Oral BID  ? ? ?Infusions: ? sodium chloride    ? ? ?PRN Medications: ?sodium chloride, acetaminophen, ondansetron (ZOFRAN) IV, sodium chloride flush, sodium chloride flush ? ? ? ?Patient Profile  ? ?81 y.o. male with history of chronic biventricular HF, NICM, CAD, severe MR/TR, HTN, obesity and multiple recent admissions, now admitted w/ acute PE and a/c CHF.  ? ?Echo this admission,  EF 25%, mildly dilated RV with moderately decreased systolic function, severe MR and TR. IVC dilated.  ? ?Assessment/Plan  ? ?1. Acute on chronic Biventricular HF   ?- ECHO 04/2020 EF 25-30% with mild MR.   ?- Echo 04/04/21 EF LVEF 20-25%, RV moderately reduced, LA/RA severely dilated, and severe MR.  ?- Echo per Dr. 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 ?- cMRI c/w severe NICM w/ basal septal midwall LGE  ?- Have been holding diuretics w/ AKI. CO2 24>18. Co-ox stable at 65%. Lactic acid pending. ? Developing low-output HF. ?- CVP 12-13 today. Appears volume  up. Will give 40 mg lasix IV once. ?- GDMT limited d/t hypotension. Continue midodrine 10 mg TID for help w/ renal perfusion  ?- Off Spiro w/ elevated K and SCr  ? ?2. CAD ?- LHC w/ mild non-obstructive CAD ?- Continue statin ?  ?3. Mitral Regurgitation ?- Severe on echo this admit and last echo 02/23 ?- TEE showed mild restriction of P2. Severe central/posterior MR due to P2 restriction and annular dilation ?- functional, severely dilated LA and LV  ?- Seen by structural team. Considering mitral TEER. Next procedure date 04/20. Need to suppress PVCs first. ?  ?4. Tricuspid Regurgitation  ?- moderate on TEE  ?- functional, severely dilated RA and RV   ?- HF optimization per above  ?  ?5. Acute PE ?- Continue Eliquis. ?- No evidence of DVT on [place] ?- ? 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  ?6. Hyperlipidemia ?- Continue statin ?  ?7. CKD IIIa ?- Scr  baseline around 1.4-1.7 last few months ?- Scr up today from 1.76>>2.02>2.1. Co-ox okay. Lactic acid pending ?- continue midodrine 10 tid to help w/  renal perfusion ?- Give 1 dose of IV lasix as above. ?- keep SBP > 110  ?- follow BMP  ?  ?8. NSVT/PVCs ?- suspect contributing to CM , need to suppress. ?- Still with very frequent PVCs. Less frequen

## 2021-05-25 NOTE — Progress Notes (Addendum)
Occupational Therapy Treatment ?Patient Details ?Name: Matthew [name] ?MRN: [id number] ?DOB: 1940-09-15 ?Today's Date: 05/25/2021 ? ? ?History of present illness 81 y.o. male adm 3/31 with SOB with RLL PE treated with Heparin> Eliquis. Admission 3/25-28/2023 with AMS and UTI. L radial heart cath completed on 4/5. PMHx: CAD, NICM, CHF, HTN, OA, obesity ?  ?OT comments ? Patient received in supine and agreeable to OT/PT treatment. Patient instructed on limited use of LUE at this time.  BUE platform Dattilio used to increase upright posture with patient demonstrating knee buckling. Patient stood at sink for grooming tasks with difficulty with up right posture and required cues for sequencing.  Discharge recommendations changed to AIR due to patient would benefit from increased rehab to increase safety with transfers and self care due to assistance needed to perform those tasks.  ? ?Recommendations for follow up therapy are one component of a multi-disciplinary discharge planning process, led by the attending physician.  Recommendations may be updated based on patient status, additional functional criteria and insurance authorization. ?   ?Follow Up Recommendations ? Acute inpatient rehab (3hours/day)  ?  ?Assistance Recommended at Discharge Frequent or constant Supervision/Assistance  ?Patient can return home with the following ? A little help with walking and/or transfers;A little help with bathing/dressing/bathroom;Assistance with cooking/housework;Direct supervision/assist for financial management;Direct supervision/assist for medications management;Assist for transportation ?  ?Equipment Recommendations ? None recommended by OT  ?  ?Recommendations for Other Services   ? ?  ?Precautions / Restrictions Precautions ?Precautions: Other (comment);Fall ?Precaution Comments: L radial heart cath on 4/5 ?Restrictions ?Weight Bearing Restrictions: No  ? ? ?  ? ?Mobility Bed Mobility ?  ?  ?  ?  ?  ?  ?  ?  ?  ? ?Transfers ?  ?   ?  ?  ?  ?  ?  ?  ?  ?  ?  ?  ?Balance   ?  ?  ?  ?  ?  ?  ?  ?  ?  ?  ?  ?  ?  ?  ?  ?  ?  ?  ?   ? ?ADL either performed or assessed with clinical judgement  ? ?ADL Overall ADL's : Needs assistance/impaired ?  ?  ?Grooming: Wash/dry hands;Wash/dry face;Minimal assistance;Standing ?Grooming Details (indicate cue type and reason): assistance with standing balance and cues for sequencing ?  ?  ?  ?  ?Upper Body Dressing : Minimal assistance;Sitting ?Upper Body Dressing Details (indicate cue type and reason): donned gown to cover back ?  ?  ?  ?  ?  ?  ?  ?  ?  ?General ADL Comments: Patient required assistance for balance standing at sink and used sink to assist.  Patient required cues for sequencing and to complete ?  ? ?Extremity/Trunk Assessment   ?  ?  ?  ?  ?  ? ?Vision   ?  ?  ?Perception   ?  ?Praxis   ?  ? ?Cognition   ?  ?  ?  ?  ?  ?  ?  ?  ?  ?  ?  ?  ?  ?  ?  ?  ?  ?  ?  ?  ?  ?   ?Exercises   ? ?  ?Shoulder Instructions   ? ? ?  ?General Comments VSS  ? ? ?Pertinent Vitals/ Pain         ? ?Home Living   ?  ?  ?  ?  ?  ?  ?  ?  ?  ?  ?  ?  ?  ?  ?  ?  ?  ?  ? ?  ?  Prior Functioning/Environment    ?  ?  ?  ?   ? ?Frequency ? Min 2X/week  ? ? ? ? ?  ?Progress Toward Goals ? ?OT Goals(current goals can now be found in the care plan section) ? Progress towards OT goals: Progressing toward goals ? ?Acute Rehab OT Goals ?OT Goal Formulation: Patient unable to participate in goal setting ?Time For Goal Achievement: 06/06/21 ?Potential to Achieve Goals: Fair ?ADL Goals ?Pt Will Perform Eating: with set-up;sitting ?Pt Will Perform Grooming: with set-up;with supervision;sitting;standing ?Pt Will Perform Lower Body Bathing: with supervision;sit to/from stand ?Pt Will Perform Upper Body Dressing: with set-up;sitting ?Pt Will Perform Lower Body Dressing: with supervision;sit to/from stand ?Pt Will Transfer to Toilet: with supervision;ambulating ?Pt Will Perform Toileting - Clothing Manipulation and hygiene: with  modified independence;sit to/from stand ?Additional ADL Goal #1: Pt will verbalie 3 strateiges to manage heart failure to reduce risk of readmission  ?Plan Discharge plan remains appropriate   ? ?Co-evaluation ? ? ? PT/OT/SLP Co-Evaluation/Treatment: Yes ?Reason for Co-Treatment: For patient/therapist safety;To address functional/ADL transfers ?PT goals addressed during session: Mobility/safety with mobility;Balance;Proper use of DME ?OT goals addressed during session: ADL's and self-care ?  ? ?  ?AM-PAC OT "6 Clicks" Daily Activity     ?Outcome Measure ? ? Help from another person eating meals?: A Little ?Help from another person taking care of personal grooming?: A Little ?Help from another person toileting, which includes using toliet, bedpan, or urinal?: A Lot ?Help from another person bathing (including washing, rinsing, drying)?: A Lot ?Help from another person to put on and taking off regular upper body clothing?: A Little ?Help from another person to put on and taking off regular lower body clothing?: A Lot ?6 Click Score: 15 ? ?  ?End of Session Equipment Utilized During Treatment: Gait belt;Other (comment) (platform Gates) ? ?OT Visit Diagnosis: Unsteadiness on feet (R26.81);Muscle weakness (generalized) (M62.81);Other symptoms and signs involving cognitive function ?  ?Activity Tolerance Patient tolerated treatment well ?  ?Patient Left in chair;with call bell/phone within reach;with chair alarm set ?  ?Nurse Communication Mobility status;Other (comment) (difficulty turning with transfers) ?  ? ?   ? ?Time: 7017-7939 ?OT Time Calculation (min): 30 min ? ?Charges: OT General Charges ?$OT Visit: 1 Visit ?OT Treatments ?$Self Care/Home Management : 8-22 mins ? ?Alfonse Flavors, OTA ?Acute Rehabilitation Services  ?Pager [phone] ?Office [phone] ? ?Addendum performed to add co-sign ?[name] Jeannett Senior ?05/25/2021, 9:04 AM ?

## 2021-05-25 NOTE — Progress Notes (Addendum)
?Progress Note ? ? ?Patient: Matthew [name] [id number] DOB: 1940-11-14 DOA: 05/13/2021     11 ?DOS: the patient was seen and examined on 05/25/2021 ?  ?Brief hospital course: ?Matthew [name] was admitted to the hospital with the working diagnosis of decompensated heart failure in the setting of mitral regurgitation.  ? ?81 yo male with the past medical history of CAD, heart failure, hypertension, severe mitral regurgitation, and BPH, who presented with chest pain and dyspnea. Reported acute onset of substernal/ left sided chest pain, radiated to his left arm and associated with dyspnea. Positive lower extremity edema.  ?Recent hospitalization 03/25 to 05/10/21 for urinary tract infection, complicated with metabolic encephalopathy. Outpatient follow up with cardiology 24 hrs before patient admitted this time, diuretic therapy was increased and plan for further work up with cardiac catheterization and transesophageal echocardiogram. On his initial physical examination his blood pressure was 106/95, HR 107, RR 22, 02 saturation 97%, positive JVD, heart with S1 and S2 present and tachycardic, lungs with no wheezing or rales, abdomen not distended and positive lower extremity edema.  ? ?Na 136, K 5.0, Cl 102, bicarbonate at 25, glucose 91 bun 18, cr 1,42 ?BNP >4,500 ?Wbc 4.3, hgb 12.9 hct 41.1 plt 104 ? ?Chest radiograph with cardiomegaly, positive hilar vascular congestion and fluid in the right fissure.  ? ?EKG 93 bpm, normal axis, qtc 509, right bundle branch block, sinus rhythm with PVC, no significant ST segment or T wave changes.  ? ?CT chest with subsegmental and segmental pulmonary artery fillings defects in the right lower lobe. Small bilateral pleural effusions,  ?Aneurysmal dilatation of the ascending aorta.  ? ?Patient was placed on furosemide for diuresis, and anticoagulation with apixaban.  ?Diuresis with furosemide.  ?Frequent PVC and NSVT, placed on amiodarone and mexiletine. ? ?Cardiac catheterization  with no significant coronary artery disease. Pulmonary capillary wedge pressure 3. CO/CI 6.1/3.0 ?TEE with LV EF less than 20%, severe reduction in RV systolic function. Sever mitral regurgitation.  ? ?Plan for possible mitral valve intervention later this month. ?His volume status has improved.  ? ?Plan to transfer patient to CIR when renal function improves.  ? ? ?Assessment and Plan: ?* Acute pulmonary embolism ([place]) ?Patient was placed on apixaban for anticoagulation with good toleration. ? ?Acute on chronic congestive heart failure ([place]) ?Patient was admitted to the cardiac ward and was placed IV furosemide for diuresis, negative fluid balance was achieved -18,204 ml, with significant improvement in his symptoms.  ? ?Echocardiogram with LV systolic function reduced to 25%, with global hypokinesis. RV systolic function is preserved. RV size with severe dilatation, RVSP 35,6 mmHg, severe dilatation of right and left atrium. Severe mitral regurgitation.  ? ?Patient with frequent PVC and NSVT, patient has been placed on amiodarone and mexiletin to suppress ectopy that possible are driving his cardiomyopathy.   ? ?Patient was placed on midodrine to support his blood pressure.   ?Follow with EP recommendations for PVC.  ?Furosemide 40 mg IV x1 per cardiology team.  ? ?Acute kidney injury superimposed on chronic kidney disease ([place]) ?CKD stage 3a, Hyponatremia. Hypomagnesemia. Hyperkalemia  ? ?Renal function with serum cr at 2.10 with K at 5,7 and Na 131.  ?AKI likely due to over-diuresis. ?Add sodium zirconium for hyperkalemia.  ?Follow up renal function in am.  ?Continue close blood pressure monitor, avoid hypotension. ?Systolic blood pressure has been 118 mmHg.  ? ?Dyslipidemia ?Continue with statin therapy.  ? ?Ascending aortic aneurysm ([place]) ?CT showing aneurysmal dilation of ascending  aorta measuring 4.5 cm.  ?-Radiologist recommending semi-annual imaging followup by CTA or MRA ?-Outpatient cardiothoracic  surgery follow-up ? ? ? ? ?  ? ?Subjective: Patient continue to be very weak and deconditioned, tolerating po, with no nausea or vomiting.  ? ?Physical Exam: ?Vitals:  ? 05/25/21 0419 05/25/21 0556 05/25/21 0751 05/25/21 1112  ?BP:  108/84 116/90 118/84  ?Pulse:  77 75 67  ?Resp:  20 14 (!) 24  ?Temp:  97.6 ?F (36.4 ?C)  (!) 97.3 ?F (36.3 ?C)  ?TempSrc:  Oral  Axillary  ?SpO2:  97% 100% 99%  ?Weight: 86.2 kg     ?Height:      ? ?Neurology awake and alert ?ENT with no pallor ?Cardiovascular with S1 and S2 present with positive extra beats. Positive systolic murmur at the apex, 3/6  ?Respiratory with no rales or wheezing ?Abdomen not distended  ?Data Reviewed: ? ? ? ?Family Communication: no family at the bedside  ? ?Disposition: ?Status is: Inpatient ?Remains inpatient appropriate because: renal failure  ? Planned Discharge Destination:  CIR  ? ? ? ? ? ?Author: ?Tawni Millers, MD ?05/25/2021 11:41 AM ? ?For on call review [email or web address].  ?

## 2021-05-25 NOTE — Consult Note (Addendum)
?Cardiology Consultation:  ? ?Patient ID: Matthew [name] ?MRN: [id number]; DOB: December 05, 1940 ? ?Admit date: 05/13/2021 ?Date of Consult: 05/25/2021 ? ?PCP:  Pcp, No ?  ?[facility] Providers ?Cardiologist:  Larae Grooms, MD  ? ? ?Patient Profile:  ? ?Matthew [name] is a 81 y.o. male with a hx of CAD (diag disease, not felt to explain his CM), NICM (goes back year though had recovery of his LVEF until this year), CKD (IIIb), HTN, VHD (severe MR) who is being seen 05/25/2021 for the evaluation of frequent PVCs at the request of Dr. Haroldine Laws. ? ?History of Present Illness:  ? ?Matthew [name] most recently was  ?-- Admitted 03/2021 with a/c CHF. Had not been taking his CHF medications or diuretics several days prior to admission.BNP on admission was >4,500 and CXR showed cardiomegaly with mild pulmonary edema. 2D Echo showed severe dilated biventricular heart failure, LVEF 20 to 25%, RV severely enlarged and severely reduced severe mitral and tricuspid regurgitation and severe BAE. Severely elevated RVSP, 50 mmHg. Cardiology consulted. Diuresed with lasix drip+ metolazone.  Cardiology adjusted his HF meds and he was discharged on Lasix 20mg  BID, Coreg 25mg  BID, Entresto 24/26mg  BID, Spironolactone 25mg  daily, and farxiga 10mg  daily. ?  ?-- Admitted 05/08/21 with AKI and metabolic encephalopathy. Creatinine 1.65. This was felt to be in the setting of recent UTI and spironolactone. Given IV fluids. HF meds restarted. Discharged 05/10/21.  ? ?-- Admitted this admission 05/13/21 with acute onset of SOB, CTA demonstrated segmental and subsegmental pulmonary artery filling defects in the right lower lobe, LE venoux duplex negative for DVT but waveforms suggestive of fluid overload.  Echo noted EF 25%, global HK + severe LVEF, heavy trabeculations with no LV thrombus, RV function normal (?), severely enlarged RV with normal RVSP, severe BAE, severe MR, severe TR, mild AI, moderate PR, mild dilation of the aortic root and  moderate dilation of the ascending aorta ?Heparin started as well as diuresis, BP did not allow for resumption of his GDMT ? ?HF team on board for acute/chronic biventricular failure ?TEE 05/18/21, LVEF 15-20% RV dilated w/severe HK ?No LA thrombus ?Severe dilated LA ?Severe MR 2/2 central/posterior MR due to P2 restriction and annular dilation ? ?Noted to have frequent PVCs (known to have previously as well it seems), some NSVT  with question if these contribute to his CM ?Amio started this admission, mexiletine added ? ?L/RHC LVEF <20%, mild NOD of coronaries ?Low filling pressures with normal cardiac output ?No significant v-waves in PCWP tracing despite presence of severe MR on TEE (may be influenced by low BP and filling pressures) ? ?Structural team consulted for evaluation of his MR, Dr. Ali Lowe felt he was a candidate for mitral transcatheter edge-to-edge repair, with plans to d/w team timing and final decisions ? ?cMRI  ?Limited study as patient refused to continue shortly after contrast administration. Unable to quantify volumes due to significant motion artifact on cine images likely from frequent PVCs, but visually LV is severely dilated with severe systolic dysfunction, RV is severely dilated with moderate systolic dysfunction. + Basal septal midwall late gadolinium enhancement, which is a scar pattern seen in nonischemic cardiomyopathies and associated with worse prognosis.  ? ?COOX has been stable 70's ? ?CM is suspect multifactorial w/ severe MR and PVCs ? ?EP is asked to weigh in on PVCs and recommendations for better suppression  ? ?PO amiodarone started 4/3 at 200mg  BID >> transitioned to gtt 05/20/21 >> back to 200mg  BID 05/23/21 ?Mexiletine  started 200mg  BID on 05/19/21 > increased to 300mg  BID 05/23/21 ?He is on midodrine for BP support ?No BB ? ?Labs ?K+ 5.7  (getting Lokelma) ?Mag 2.3 ?BUN/Creat 34/2.10 (new baseline about 1.7) ? ?I don't see any prior out pt monitoring for PVC burden ?EKGs  ?Nov  2013 no PVCs ?Echo 07/15/2012 LVEF 50-55% ?May 2015 V trigeminy ?April 2017 V bigeminy ?July 2018 V  bigeminy ?April 15 2018 EKG had PVCs, couplets ?March 10/2019 some PVCs ?Next echo is March 2022, LVEF 25-30%, mild MR ?04/04/21 LVEF 20-25%, severe MR ? ?He tells me he is breathing well currently, gets SOB intermittently though. ?No CP ?No palpitations or cardiac awareness ?No reports of syncope. ?Inquires about having the heart valve procedure ? ? ?Past Medical History:  ?Diagnosis Date  ? CAD (coronary artery disease)   ? Chronic HFrEF (heart failure with reduced ejection fraction) ([place])   ? Chronic kidney disease, stage 3b ([place])   ? Encephalopathy 04/2021  ? admitted for metabolic encephalopathy  ? HTN (hypertension)   ? Hyperlipidemia   ? NICM (nonischemic cardiomyopathy) ([place])   ? Obesity   ? Osteoarthritis   ? Severe mitral regurgitation   ? Thoracic aortic aneurysm (TAA) ([place])   ? Thrombocytopenia ([place])   ? noted in labs in 2023  ? Tricuspid regurgitation   ? ? ?Past Surgical History:  ?Procedure Laterality Date  ? CHONDROPLASTY Left 01/11/2017  ? Procedure: CHONDROPLASTY of medial and patella compartment;  Surgeon: Dorna Leitz, MD;  Location: [place];  Service: Orthopedics;  Laterality: Left;  ? KNEE ARTHROSCOPY Left 01/11/2017  ? Procedure: ARTHROSCOPIC IRRIGATION AND DEBRIDMENT KNEE;  Surgeon: Dorna Leitz, MD;  Location: [place];  Service: Orthopedics;  Laterality: Left;  ? KNEE SURGERY    ? MENISECTOMY Left 01/11/2017  ? Procedure: Lateral MENISECTOMY;  Surgeon: Dorna Leitz, MD;  Location: [place];  Service: Orthopedics;  Laterality: Left;  ? RIGHT/LEFT HEART CATH AND CORONARY ANGIOGRAPHY N/A 05/18/2021  ? Procedure: RIGHT/LEFT HEART CATH AND CORONARY ANGIOGRAPHY;  Surgeon: Jolaine Artist, MD;  Location: [facility];  Service: Cardiovascular;  Laterality: N/A;  ? SYNOVECTOMY Left 01/11/2017  ? Procedure: Four compartment SYNOVECTOMY;  Surgeon: Dorna Leitz, MD;  Location: [place];  Service: Orthopedics;   Laterality: Left;  ? TEE WITHOUT CARDIOVERSION N/A 05/18/2021  ? Procedure: TRANSESOPHAGEAL ECHOCARDIOGRAM (TEE);  Surgeon: Jolaine Artist, MD;  Location: [facility];  Service: Cardiovascular;  Laterality: N/A;  ?  ? ?Home Medications:  ?Prior to Admission medications   ?Medication Sig Start Date End Date Taking? Authorizing Provider  ?acetaminophen (TYLENOL) 500 MG tablet Take 500 mg by mouth every 6 (six) hours as needed for moderate pain.   Yes [provider]  ?aspirin EC 81 MG tablet Take 1 tablet (81 mg total) by mouth daily. 03/29/17  Yes Jettie Booze, MD  ?atorvastatin (LIPITOR) 10 MG tablet TAKE 1 TABLET(10 MG) BY MOUTH DAILY ?Patient taking differently: Take 10 mg by mouth daily. 07/19/20  Yes Jettie Booze, MD  ?dapagliflozin propanediol (FARXIGA) 10 MG TABS tablet Take 1 tablet (10 mg total) by mouth daily. 07/09/20  Yes Jettie Booze, MD  ?furosemide (LASIX) 20 MG tablet Take 2 tablets (40 mg total) by mouth every morning AND 1 tablet (20 mg total) every evening. 05/13/21  Yes Clegg, Amy D, NP  ?metoprolol succinate (TOPROL-XL) 25 MG 24 hr tablet Take 1.5 tablets (37.5 mg total) by mouth at bedtime. 05/10/21  Yes Jettie Booze, MD  ?  Misc Natural Products (PROSTATE HEALTH) CAPS Take 1 capsule by mouth in the morning and at bedtime.   Yes [provider]  ?sacubitril-valsartan (ENTRESTO) 24-26 MG Take 1 tablet by mouth 2 (two) times daily. 05/04/21  Yes Jettie Booze, MD  ?spironolactone (ALDACTONE) 25 MG tablet Take 1 tablet (25 mg total) by mouth at bedtime. 04/09/21  Yes Eppie Gibson, MD  ?traMADol (ULTRAM) 50 MG tablet Take 50 mg by mouth every 8 (eight) hours as needed for pain. 04/12/21  Yes [provider]  ? ? ?Inpatient Medications: ?Scheduled Meds: ? amiodarone  200 mg Oral BID  ? apixaban  5 mg Oral BID  ? atorvastatin  10 mg Oral Daily  ? Chlorhexidine Gluconate Cloth  6 each Topical Daily  ? leptospermum manuka honey  1  application. Topical Daily  ? magnesium oxide  400 mg Oral Daily  ? mexiletine  300 mg Oral Q12H  ? midodrine  10 mg Oral TID WC  ? sodium chloride flush  3 mL Intravenous Q12H  ? sodium chloride flush  3 mL Intraveno

## 2021-05-25 NOTE — Progress Notes (Signed)
Inpatient Rehab Admissions Coordinator:  ? ? I do not have a CIR bed for this pt. Today. Per acute MD, Pt. Not yet stable for d/c. I will follow for potential admit pending medical readiness. ? ?Clemens Catholic, MS, CCC-SLP ?Rehab Admissions Coordinator  ?[phone] (celll) ?[phone] (office) ? ?

## 2021-05-26 DIAGNOSIS — I5023 Acute on chronic systolic (congestive) heart failure: Secondary | ICD-10-CM | POA: Diagnosis not present

## 2021-05-26 DIAGNOSIS — I2699 Other pulmonary embolism without acute cor pulmonale: Secondary | ICD-10-CM | POA: Diagnosis not present

## 2021-05-26 DIAGNOSIS — N179 Acute kidney failure, unspecified: Secondary | ICD-10-CM | POA: Diagnosis not present

## 2021-05-26 DIAGNOSIS — E785 Hyperlipidemia, unspecified: Secondary | ICD-10-CM | POA: Diagnosis not present

## 2021-05-26 DIAGNOSIS — I493 Ventricular premature depolarization: Secondary | ICD-10-CM | POA: Diagnosis not present

## 2021-05-26 LAB — BASIC METABOLIC PANEL
Anion gap: 9 (ref 5–15)
BUN: 38 mg/dL — ABNORMAL HIGH (ref 8–23)
CO2: 23 mmol/L (ref 22–32)
Calcium: 8.9 mg/dL (ref 8.9–10.3)
Chloride: 99 mmol/L (ref 98–111)
Creatinine, Ser: 2.3 mg/dL — ABNORMAL HIGH (ref 0.61–1.24)
GFR, Estimated: 28 mL/min — ABNORMAL LOW (ref >60)
Glucose, Bld: 70 mg/dL (ref 70–99)
Potassium: 4.7 mmol/L (ref 3.5–5.1)
Sodium: 131 mmol/L — ABNORMAL LOW (ref 135–145)

## 2021-05-26 LAB — COOXEMETRY PANEL
Carboxyhemoglobin: 1.8 % — ABNORMAL HIGH (ref 0.5–1.5)
Methemoglobin: <0.7 % (ref 0.0–1.5)
O2 Saturation: 73 %
Total hemoglobin: 11.8 g/dL — ABNORMAL LOW (ref 12.0–16.0)

## 2021-05-26 LAB — PROCALCITONIN: Procalcitonin: <0.1 ng/mL

## 2021-05-26 LAB — MAGNESIUM: Magnesium: 2.1 mg/dL (ref 1.7–2.4)

## 2021-05-26 MED ORDER — FUROSEMIDE 10 MG/ML IJ SOLN
40.0000 mg | Freq: Two times a day (BID) | INTRAMUSCULAR | Status: DC
  Administered 2021-05-26 – 2021-05-27 (×3): 40 mg via INTRAVENOUS
  Filled 2021-05-26 (×3): qty 4

## 2021-05-26 NOTE — Progress Notes (Signed)
IP rehab admissions - awaiting medical readiness for potential acute inpatient rehab admission.  Will follow up again tomorrow.  Call for questions.  [phone] ?

## 2021-05-26 NOTE — Progress Notes (Addendum)
? ? Advanced Heart Failure Rounding Note ? ?PCP-Cardiologist: Larae Grooms, MD  ? ?Subjective:   ? ?04/05: RHC Low filling pressures with normal CO.  ?04/06: Seen by structural team for consideration of mitral TEER ?04/12: Started on milrinone 0.125, lactic acid 2.8>>1.7>1.6 ? ?Co-ox 73% on milrinone 0.125 ? ?Scr 1.7>2.0>2.39>2.30 ? ?K 4.7 and mag 2.1 ? ?BC NG X 24 hrs. Procalcitonin < 0.10 X 2.  ? ?Son present this am. Denies dyspnea, orthopnea, PND. ? ?Ambulated 40 feet with PT yesterday, reports left leg gave out. ? ? ? ? ?TEE:  Mild restriction of P2. Severe central/posterior MR due to P2 restriction and annular dilation. EF 15-20%, LV severely dilated w/ severe HK. RV dilated + severe HK. Mod TR  ? ?R/LHCAssessment: ?1. Mild non-obstructive CAD ?2. NICM EF < 20% ?3. Low filling pressures with normal cardiac output ?4. No significant v-waves in PCWP tracing despite presence of severe MR on TEE (may be influenced by low BP and filling pressures) ? ?Ao = 84/60 (70) ?LV = 83/6 ?RA = 1 ?RV = 20/5 ?PA = 21/7 (14) ?PCW = 3 (no prominent v waves) ?Fick cardiac output/index = 6.1/3.0 ?PVR = 1.8 WU ?Ao sat = 96% ?PA sat = 71%, 74% ? ? ?cMRI 05/20/21 ? ?IMPRESSION: ?1. Limited study as patient refused to continue shortly after ?contrast administration. Single shot LGE images and post contrast T1 ?maps were obtained, but high resolution LGE images and flow ?quantification to assess mitral/tricuspid regurgitation were not ?done ?  ?2. Unable to quantify volumes due to significant motion artifact on ?cine images likely from frequent PVCs, but visually LV is severely ?dilated with severe systolic dysfunction ?  ?3. Visually RV is severely dilated with moderate systolic ?dysfunction ?  ?4. Basal septal midwall late gadolinium enhancement, which is a scar ?pattern seen in nonischemic cardiomyopathies and associated with ?worse prognosis ?  ?5. RV insertion site LGE, which is a nonspecific finding often seen ?in setting of  elevated pulmonary pressures ?  ?6.  Ascending aortic aneurysm measuring 32mm ? ?Objective:   ?Weight Range: ?87.8 kg ?Body mass index is 21.81 kg/m?.  ? ?Vital Signs:   ?Temp:  [97.3 ?F (36.3 ?C)-97.6 ?F (36.4 ?C)] 97.6 ?F (36.4 ?C) (04/13 0409) ?Pulse Rate:  [67-80] 80 (04/13 0409) ?Resp:  [16-24] 16 (04/13 0409) ?BP: (94-118)/(69-84) 99/70 (04/13 0800) ?SpO2:  [99 %-100 %] 100 % (04/13 0409) ?Weight:  [87.8 kg] 87.8 kg (04/13 0409) ?Last BM Date : 05/23/21 ? ?Weight change: ?Filed Weights  ? 05/24/21 0500 05/25/21 0419 05/26/21 0409  ?Weight: 86.4 kg 86.2 kg 87.8 kg  ? ? ?Intake/Output:  ? ?Intake/Output Summary (Last 24 hours) at 05/26/2021 1056 ?Last data filed at 05/26/2021 0452 ?Gross per 24 hour  ?Intake 286.31 ml  ?Output 850 ml  ?Net -563.69 ml  ? ?  ? ? ?Physical Exam  ? ?CVP 13 ?General:  Thin, chronically ill appearing ?HEENT: normal ?Neck: supple. JVP 12-14 cm. Carotids 2+ bilat; no bruits.  ?Cor: PMI nondisplaced. Regular rate & rhythm. No rubs, gallops, 3/6 MR murmur ?Lungs: clear ?Abdomen: soft, nontender, nondistended.  ?Extremities: no cyanosis, clubbing, rash, trace edema ?Neuro: alert & orientedx3, cranial nerves grossly intact. moves all 4 extremities w/o difficulty. Affect pleasant ? ? ?Telemetry  ? ?SR 70s-80s, 30-40 PVCs/min (personally reviewed) ? ?Labs  ?  ?CBC ?Recent Labs  ?  05/24/21 ?1112 05/25/21 ?1313  ?WBC 4.0 4.0  ?HGB 11.9* 12.8*  ?HCT 37.8* 39.5  ?MCV 79.6* 80.6  ?  PLT 159 167  ? ? ?Basic Metabolic Panel ?Recent Labs  ?  05/25/21 ?0318 05/25/21 ?1115 05/26/21 ?0451  ?NA 131* 131* 131*  ?K 5.7* 5.5* 4.7  ?CL 100 97* 99  ?CO2 18* 22 23  ?GLUCOSE 76 123* 70  ?BUN 34* 35* 38*  ?CREATININE 2.10* 2.39* 2.30*  ?CALCIUM 9.3 9.5 8.9  ?MG 2.3  --  2.1  ? ?Liver Function Tests ?No results for input(s): AST, ALT, ALKPHOS, BILITOT, PROT, ALBUMIN in the last 72 hours. ?No results for input(s): LIPASE, AMYLASE in the last 72 hours. ?Cardiac Enzymes ?No results for input(s): CKTOTAL, CKMB,  CKMBINDEX, TROPONINI in the last 72 hours. ? ?BNP: ?BNP (last 3 results) ?Recent Labs  ?  05/09/21 ?[id number] 05/10/21 ?0205 05/13/21 ?2142  ?BNP >4,500.0* >4,500.0* >4,500.0*  ? ? ?ProBNP (last 3 results) ?No results for input(s): PROBNP in the last 8760 hours. ? ? ?D-Dimer ?No results for input(s): DDIMER in the last 72 hours. ?Hemoglobin A1C ?No results for input(s): HGBA1C in the last 72 hours. ?Fasting Lipid Panel ?No results for input(s): CHOL, HDL, LDLCALC, TRIG, CHOLHDL, LDLDIRECT in the last 72 hours. ?Thyroid Function Tests ?No results for input(s): TSH, T4TOTAL, T3FREE, THYROIDAB in the last 72 hours. ? ?Invalid input(s): FREET3 ? ?Other results: ? ? ?Imaging  ? ? ?DG CHEST PORT 1 VIEW ? ?Result Date: 05/25/2021 ?CLINICAL DATA:  Shortness of breath. EXAM: PORTABLE CHEST 1 VIEW COMPARISON:  Chest x-ray dated May 13, 2021. FINDINGS: New right upper extremity PICC line with tip in the proximal right atrium. Stable cardiomegaly. Normal pulmonary vascularity. No consolidation, pneumothorax, or large pleural effusion. No acute osseous abnormality. IMPRESSION: 1. No active disease. Electronically Signed   By: Titus Dubin M.D.   On: 05/25/2021 13:03   ? ? ?Medications:   ? ? ?Scheduled Medications: ? amiodarone  400 mg Oral BID  ? apixaban  5 mg Oral BID  ? atorvastatin  10 mg Oral Daily  ? Chlorhexidine Gluconate Cloth  6 each Topical Daily  ? docusate sodium  100 mg Oral Daily  ? leptospermum manuka honey  1 application. Topical Daily  ? magnesium oxide  400 mg Oral Daily  ? mexiletine  300 mg Oral Q12H  ? midodrine  10 mg Oral TID WC  ? sodium chloride flush  3 mL Intravenous Q12H  ? sodium chloride flush  3 mL Intravenous Q12H  ? sodium zirconium cyclosilicate  10 g Oral BID  ? ? ?Infusions: ? sodium chloride    ? milrinone 0.125 mcg/kg/min (05/25/21 1509)  ? ? ?PRN Medications: ?sodium chloride, acetaminophen, ondansetron (ZOFRAN) IV, sodium chloride flush, sodium chloride flush ? ? ? ?Patient Profile  ? ?81  y.o. male with history of chronic biventricular HF, NICM, CAD, severe MR/TR, HTN, obesity and multiple recent admissions, now admitted w/ acute PE and a/c CHF.  ? ?Echo this admission, EF 25%, mildly dilated RV with moderately decreased systolic function, severe MR and TR. IVC dilated.  ? ?Assessment/Plan  ? ?1. Acute on chronic Biventricular HF   ?- ECHO 04/2020 EF 25-30% with mild MR.   ?- Echo 04/04/21 EF LVEF 20-25%, RV moderately reduced, LA/RA severely dilated, and severe MR.  ?- Echo per Dr. 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 ?- cMRI c/w severe NICM w/ basal septal midwall LGE  ?- Elevated lactic acid 2.8 on 04/12. Now on milrinone 0.125 with improvement in lactic acidosis. Continue milrinone today. Co-ox 73%. ?- CVP 12-13 today. Give 40 mg lasix IV BID. ?- GDMT limited d/t hypotension and AKI. Continue midodrine 10 mg TID for help w/ renal perfusion  ?- Off Spiro w/ elevated K and SCr  ? ?2. Lactic acidosis ?- Suspect d/t low-output HF ?- 2.8>>1.7 after milrinone added ?- BC with NG X 1 day ?- No leukocytosis, AF, CXR 04/12 with no acute process ? ?3. CAD ?- LHC w/ mild non-obstructive CAD ?- Continue statin ?  ?4. Mitral Regurgitation ?- Severe on echo this admit and last echo 02/23 ?- TEE showed mild restriction of P2. Severe central/posterior MR due to P2 restriction and annular dilation ?- functional, severely dilated LA and LV  ?- Seen by structural team. Considering mitral TEER. Next procedure date 04/20.  ?  ?5. Tricuspid Regurgitation  ?- moderate on TEE  ?- functional, severely dilated RA and RV   ?- HF optimization per above  ?  ?6. Acute PE ?- Continue Eliquis. ?- No  evidence of DVT on [place] ?- ? 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  ?7. Hyperlipidemia ?- Continue statin ?  ?8. AKI on CKD IIIa ?- Scr  baseline 1.4-1.7 last few month

## 2021-05-26 NOTE — Progress Notes (Signed)
?Progress Note ? ? ?Patient: Matthew [name] [id number] DOB: 12-28-1940 DOA: 05/13/2021     12 ?DOS: the patient was seen and examined on 05/26/2021 ?  ?Brief hospital course: ?Mr. Malinsky was admitted to the hospital with the working diagnosis of decompensated heart failure in the setting of mitral regurgitation.  ? ?81 yo male with the past medical history of CAD, heart failure, hypertension, severe mitral regurgitation, and BPH, who presented with chest pain and dyspnea. Reported acute onset of substernal/ left sided chest pain, radiated to his left arm and associated with dyspnea. Positive lower extremity edema.  ?Recent hospitalization 03/25 to 05/10/21 for urinary tract infection, complicated with metabolic encephalopathy. Outpatient follow up with cardiology 24 hrs before patient admitted this time, diuretic therapy was increased and plan for further work up with cardiac catheterization and transesophageal echocardiogram. On his initial physical examination his blood pressure was 106/95, HR 107, RR 22, 02 saturation 97%, positive JVD, heart with S1 and S2 present and tachycardic, lungs with no wheezing or rales, abdomen not distended and positive lower extremity edema.  ? ?Na 136, K 5.0, Cl 102, bicarbonate at 25, glucose 91 bun 18, cr 1,42 ?BNP >4,500 ?Wbc 4.3, hgb 12.9 hct 41.1 plt 104 ? ?Chest radiograph with cardiomegaly, positive hilar vascular congestion and fluid in the right fissure.  ? ?EKG 93 bpm, normal axis, qtc 509, right bundle branch block, sinus rhythm with PVC, no significant ST segment or T wave changes.  ? ?CT chest with subsegmental and segmental pulmonary artery fillings defects in the right lower lobe. Small bilateral pleural effusions,  ?Aneurysmal dilatation of the ascending aorta.  ? ?Patient was placed on furosemide for diuresis, and anticoagulation with apixaban.  ?Diuresis with furosemide.  ?Frequent PVC and NSVT, placed on amiodarone and mexiletine. ? ?Cardiac catheterization  with no significant coronary artery disease. Pulmonary capillary wedge pressure 3. CO/CI 6.1/3.0 ?TEE with LV EF less than 20%, severe reduction in RV systolic function. Sever mitral regurgitation.  ? ?Plan for possible mitral valve intervention later this month. ?His volume status has improved but positive hypoperfusion. ?04/13 resumed on milrinone infusion.  ? ?Plan to transfer patient to CIR when renal function improves.  ? ? ?Assessment and Plan: ?* Acute pulmonary embolism ([place]) ?Patient was placed on apixaban for anticoagulation with good toleration. ? ?Acute on chronic congestive heart failure ([place]) ?Patient was admitted to the cardiac ward and was placed IV furosemide for diuresis, negative fluid balance was achieved -18,204 ml, with significant improvement in his symptoms.  ? ?Echocardiogram with LV systolic function reduced to 25%, with global hypokinesis. RV systolic function is preserved. RV size with severe dilatation, RVSP 35,6 mmHg, severe dilatation of right and left atrium. Severe mitral regurgitation.  ? ?Patient with frequent PVC and NSVT, patient has been placed on amiodarone and mexiletin to suppress ectopy that possible are driving his cardiomyopathy.   ? ?Patient with signs of hypoperfusion, placed on milrinone infusion and continue with midodrine for blood pressure support.  ? ?Per EP recommendations continue with amiodarone and mexiletine, follow up post mitral valve intervention.   ? ? ?Acute kidney injury superimposed on chronic kidney disease ([place]) ?CKD stage 3a, Hyponatremia. Hypomagnesemia. Hyperkalemia  ? ?Patient was placed on milrinone infusion with improvement in renal function with serum cr at 2,30 with K at 4,7 and serum Na at 131. ?Plan to continue close follow up on renal function and electrolytes. ?Avoid hypotension and nephrotoxic medications.   ? ?Dyslipidemia ?Continue with statin therapy.  ? ?  Ascending aortic aneurysm ([place]) ?CT showing aneurysmal dilation of ascending aorta  measuring 4.5 cm.  ?-Radiologist recommending semi-annual imaging followup by CTA or MRA ?-Outpatient cardiothoracic surgery follow-up ? ? ? ? ?  ? ?Subjective: Patient with improvement in dyspnea and edema  ? ?Physical Exam: ?Vitals:  ? 05/26/21 0010 05/26/21 0409 05/26/21 0800 05/26/21 1300  ?BP: 105/69 94/73 99/70  112/74  ?Pulse: 77 80  90  ?Resp: 19 16  20   ?Temp: (!) 97.3 ?F (36.3 ?C) 97.6 ?F (36.4 ?C)  97.6 ?F (36.4 ?C)  ?TempSrc: Oral Axillary  Oral  ?SpO2: 100% 100%  99%  ?Weight:  87.8 kg    ?Height:      ? ?Neurology awake and alert ?ENT with no pallor ?Cardiovascular with S1 and S2 present and rhythmic with no gallops, no rubs or murmurs ?Respiratory with no rales ?Abdomen not distended ?Trace lower extremity edema  ?Data Reviewed: ? ? ? ?Family Communication: no family at the bedside  ? ?Disposition: ?Status is: Inpatient ?Remains inpatient appropriate because: heart failure  ? Planned Discharge Destination: CIR ? ?Author: ?Tawni Millers, MD ?05/26/2021 4:42 PM ? ?For on call review [email or web address]  ?

## 2021-05-26 NOTE — Care Management Important Message (Signed)
Important Message ? ?Patient Details  ?Name: Matthew [name] ?MRN: [id number] ?Date of Birth: 1940-03-09 ? ? ?Medicare Important Message Given:  Yes ? ? ? ? ?Renie Ora ?05/26/2021, 9:56 AM ?

## 2021-05-27 ENCOUNTER — Encounter (HOSPITAL_COMMUNITY): Payer: Self-pay

## 2021-05-27 ENCOUNTER — Ambulatory Visit (HOSPITAL_COMMUNITY): Admit: 2021-05-27 | Payer: Medicare Other | Admitting: Internal Medicine

## 2021-05-27 DIAGNOSIS — I2699 Other pulmonary embolism without acute cor pulmonale: Secondary | ICD-10-CM | POA: Diagnosis not present

## 2021-05-27 DIAGNOSIS — E785 Hyperlipidemia, unspecified: Secondary | ICD-10-CM | POA: Diagnosis not present

## 2021-05-27 DIAGNOSIS — I5023 Acute on chronic systolic (congestive) heart failure: Secondary | ICD-10-CM | POA: Diagnosis not present

## 2021-05-27 DIAGNOSIS — I493 Ventricular premature depolarization: Secondary | ICD-10-CM | POA: Diagnosis not present

## 2021-05-27 DIAGNOSIS — N179 Acute kidney failure, unspecified: Secondary | ICD-10-CM | POA: Diagnosis not present

## 2021-05-27 LAB — COOXEMETRY PANEL
Carboxyhemoglobin: 2.1 % — ABNORMAL HIGH (ref 0.5–1.5)
Methemoglobin: <0.7 % (ref 0.0–1.5)
O2 Saturation: 81.2 %
Total hemoglobin: 11.8 g/dL — ABNORMAL LOW (ref 12.0–16.0)

## 2021-05-27 LAB — PROCALCITONIN: Procalcitonin: 0.1 ng/mL

## 2021-05-27 LAB — BASIC METABOLIC PANEL
Anion gap: 10 (ref 5–15)
BUN: 38 mg/dL — ABNORMAL HIGH (ref 8–23)
CO2: 25 mmol/L (ref 22–32)
Calcium: 9 mg/dL (ref 8.9–10.3)
Chloride: 98 mmol/L (ref 98–111)
Creatinine, Ser: 2.17 mg/dL — ABNORMAL HIGH (ref 0.61–1.24)
GFR, Estimated: 30 mL/min — ABNORMAL LOW (ref >60)
Glucose, Bld: 73 mg/dL (ref 70–99)
Potassium: 4.3 mmol/L (ref 3.5–5.1)
Sodium: 133 mmol/L — ABNORMAL LOW (ref 135–145)

## 2021-05-27 LAB — MAGNESIUM: Magnesium: 2.3 mg/dL (ref 1.7–2.4)

## 2021-05-27 SURGERY — RIGHT/LEFT HEART CATH AND CORONARY ANGIOGRAPHY
Anesthesia: LOCAL

## 2021-05-27 SURGERY — ECHOCARDIOGRAM, TRANSESOPHAGEAL
Anesthesia: Moderate Sedation

## 2021-05-27 MED ORDER — TORSEMIDE 20 MG PO TABS
40.0000 mg | ORAL_TABLET | Freq: Every day | ORAL | Status: DC
  Administered 2021-05-27 – 2021-05-29 (×3): 40 mg via ORAL
  Filled 2021-05-27 (×3): qty 2

## 2021-05-27 NOTE — Progress Notes (Signed)
?Progress Note ? ? ?Patient: Matthew [name] [id number] DOB: [birth year] DOA: 05/13/2021     13 ?DOS: the patient was seen and examined on 05/27/2021 ?  ?Brief hospital course: ?[name] was admitted to the hospital with the working diagnosis of decompensated heart failure in the setting of mitral regurgitation.  ? ?81 yo male with the past medical history of CAD, heart failure, hypertension, severe mitral regurgitation, and BPH, who presented with chest pain and dyspnea. Reported acute onset of substernal/ left sided chest pain, radiated to his left arm and associated with dyspnea. Positive lower extremity edema.  ?Recent hospitalization 03/25 to 05/10/21 for urinary tract infection, complicated with metabolic encephalopathy. Outpatient follow up with cardiology 24 hrs before patient admitted this time, diuretic therapy was increased and plan for further work up with cardiac catheterization and transesophageal echocardiogram. On his initial physical examination his blood pressure was 106/95, HR 107, RR 22, 02 saturation 97%, positive JVD, heart with S1 and S2 present and tachycardic, lungs with no wheezing or rales, abdomen not distended and positive lower extremity edema.  ? ?Na 136, K 5.0, Cl 102, bicarbonate at 25, glucose 91 bun 18, cr 1,42 ?BNP >4,500 ?Wbc 4.3, hgb 12.9 hct 41.1 plt 104 ? ?Chest radiograph with cardiomegaly, positive hilar vascular congestion and fluid in the right fissure.  ? ?EKG 93 bpm, normal axis, qtc 509, right bundle branch block, sinus rhythm with PVC, no significant ST segment or T wave changes.  ? ?CT chest with subsegmental and segmental pulmonary artery fillings defects in the right lower lobe. Small bilateral pleural effusions,  ?Aneurysmal dilatation of the ascending aorta.  ? ?Patient was placed on furosemide for diuresis, and anticoagulation with apixaban.  ?Diuresis with furosemide.  ?Frequent PVC and NSVT, placed on amiodarone and mexiletine. ? ?Cardiac catheterization  with no significant coronary artery disease. Pulmonary capillary wedge pressure 3. CO/CI 6.1/3.0 ?TEE with LV EF less than 20%, severe reduction in RV systolic function. Sever mitral regurgitation.  ? ?Plan for possible mitral valve intervention later this month. ?His volume status has improved but positive hypoperfusion. ?04/13 resumed on milrinone infusion.  ? ?Plan to transfer patient to CIR when renal function improves.  ? ? ?Assessment and Plan: ?* Acute pulmonary embolism ([name]) ?Patient was placed on apixaban for anticoagulation with good toleration. ? ?Acute on chronic congestive heart failure ([name]) ?Patient was admitted to the cardiac ward and was placed IV furosemide for diuresis, negative fluid balance was achieved -18,204 ml, with significant improvement in his symptoms.  ? ?Echocardiogram with LV systolic function reduced to 25%, with global hypokinesis. RV systolic function is preserved. RV size with severe dilatation, RVSP 35,6 mmHg, severe dilatation of right and left atrium. Severe mitral regurgitation.  ? ?Patient with frequent PVC and NSVT, patient has been placed on amiodarone and mexiletin to suppress ectopy that possible are driving his cardiomyopathy.   ? ?Patient with signs of hypoperfusion.   ?Currently on milrinone infusion and continue with midodrine for blood pressure support.  ? ?Per EP recommendations continue with amiodarone and mexiletine, follow up post mitral valve intervention.   ? ? ?Acute kidney injury superimposed on chronic kidney disease ([name]) ?CKD stage 3a, Hyponatremia. Hypomagnesemia. Hyperkalemia  ? ?Renal function with serum cr at 2,17 with K at 4,3 and Na 133. ?Currently on milrinone and midodrine.  ? ?Follow up on renal function and electrolytes. ?Avoid hypotension and nephrotoxic medications.   ? ?Dyslipidemia ?Continue with statin therapy.  ? ?Ascending aortic aneurysm ([name]) ?CT  showing aneurysmal dilation of ascending aorta measuring 4.5 cm.  ?-Radiologist  recommending semi-annual imaging followup by CTA or MRA ?-Outpatient cardiothoracic surgery follow-up ? ? ? ? ?  ? ?Subjective: Patient is feeling better, no chest pain or dyspnea, continue to be very weak and deconditioned  ? ?Physical Exam: ?Vitals:  ? 05/26/21 2034 05/27/21 0033 05/27/21 0433 05/27/21 [id number]  ?BP: 105/71 107/71 100/67   ?Pulse: 78 100 82   ?Resp: (!) 21 19 16    ?Temp: 97.6 ?F (36.4 ?C) 97.6 ?F (36.4 ?C) 97.6 ?F (36.4 ?C)   ?TempSrc: Oral Oral Oral   ?SpO2: 98%     ?Weight:    86.4 kg  ?Height:      ? ?Neurology awake and alert ?ENT with mild pallor ?Cardiovascular with S1 and S2 present and rhythmic ?Respiratory with no rales ?Abdomen not distended ?Trace lower extremity edema  ?Data Reviewed: ? ? ? ?Family Communication: no family at the bedside  ? ?Disposition: ?Status is: Inpatient ?Remains inpatient appropriate because: renal failure  ? Planned Discharge Destination:  CIR  ? ? ? ?Author: ?Tawni Millers, MD ?05/27/2021 4:25 PM ? ?For on call review [email or web address]  ?

## 2021-05-27 NOTE — Progress Notes (Signed)
Occupational Therapy Treatment ?Patient Details ?Name: Matthew [name] ?MRN: [id number] ?DOB: 1940/12/19 ?Today's Date: 05/27/2021 ? ? ?History of present illness 81 y.o. male adm 3/31 with SOB with RLL PE treated with Heparin> Eliquis. Admission 3/25-28/2023 with AMS and UTI. L radial heart cath completed on 4/5. PMHx: CAD, NICM, CHF, HTN, OA, obesity ?  ?OT comments ? Patient received in supine and agreeable to PT/OT session. Patient instructed to move feet to EOB and required extra time to perform and instruction to scoot hips to EOB. On EOB patient demonstrated good balance and able to perform UB dressing with donning gown. Patient was +2 to stand and used RW for mobility. Patient stood at sink for grooming task reliant on sink for balance and able to use RUE to perform grooming tasks.  Patient would benefit from AIR to continue to address self care and functional transfers.   ? ?Recommendations for follow up therapy are one component of a multi-disciplinary discharge planning process, led by the attending physician.  Recommendations may be updated based on patient status, additional functional criteria and insurance authorization. ?   ?Follow Up Recommendations ? Acute inpatient rehab (3hours/day)  ?  ?Assistance Recommended at Discharge Frequent or constant Supervision/Assistance  ?Patient can return home with the following ? A little help with walking and/or transfers;A little help with bathing/dressing/bathroom;Assistance with cooking/housework;Direct supervision/assist for financial management;Direct supervision/assist for medications management;Assist for transportation ?  ?Equipment Recommendations ? None recommended by OT  ?  ?Recommendations for Other Services   ? ?  ?Precautions / Restrictions Precautions ?Precautions: Other (comment);Fall ?Precaution Comments: L radial heart cath on 4/5 ?Restrictions ?Weight Bearing Restrictions: No  ? ? ?  ? ?Mobility Bed Mobility ?Overal bed mobility: Needs  Assistance ?Bed Mobility: Supine to Sit ?  ?  ?Supine to sit: Min guard, HOB elevated ?  ?  ?General bed mobility comments: increased time to perform with instructions to perform ?  ? ?Transfers ?Overall transfer level: Needs assistance ?Equipment used: Rolling [place] (2 wheels) ?Transfers: Sit to/from Stand ?Sit to Stand: Min assist, +2 physical assistance, +2 safety/equipment ?  ?  ?  ?  ?  ?General transfer comment: RW used on this date for sit to stands and mobility with improved posture but continues to demonstrate trunk flexion.  Chair placed behind patient for transfers ?  ?  ?Balance Overall balance assessment: Needs assistance ?Sitting-balance support: Feet supported ?Sitting balance-[name] Scale: Good ?  ?  ?Standing balance support: Single extremity supported, Bilateral upper extremity supported, During functional activity ?Standing balance-[name] Scale: Poor ?Standing balance comment: reliant on [place] and sink for balance. able to use RUE to wash face standing at sink ?  ?  ?  ?  ?  ?  ?  ?  ?  ?  ?  ?   ? ?ADL either performed or assessed with clinical judgement  ? ?ADL Overall ADL's : Needs assistance/impaired ?  ?  ?Grooming: Wash/dry hands;Wash/dry face;Minimal assistance;Standing ?Grooming Details (indicate cue type and reason): stood at sink with improved posture and required assistance to turn on/off water due to difficulty reaching ?  ?  ?  ?  ?Upper Body Dressing : Minimal assistance;Sitting ?Upper Body Dressing Details (indicate cue type and reason): donned gown to cover back ?  ?  ?  ?  ?  ?  ?  ?  ?  ?General ADL Comments: sat on EOB to donn gown with no assistance for balance ?  ? ?Extremity/Trunk Assessment   ?  ?  ?  ?  ?  ? ?  Vision   ?  ?  ?Perception   ?  ?Praxis   ?  ? ?Cognition Arousal/Alertness: Awake/alert ?Behavior During Therapy: [facility] for tasks assessed/performed ?Overall Cognitive Status: Within Functional Limits for tasks assessed ?  ?  ?  ?  ?  ?  ?  ?  ?  ?  ?  ?  ?  ?  ?  ?   ?General Comments: increased time to follow commands ?  ?  ?   ?Exercises   ? ?  ?Shoulder Instructions   ? ? ?  ?General Comments    ? ? ?Pertinent Vitals/ Pain       Pain Assessment ?Pain Assessment: Faces ?Faces Pain Scale: Hurts a little bit ?Pain Location: L arm ?Pain Descriptors / Indicators: Discomfort, Operative site guarding ?Pain Intervention(s): Monitored during session, Repositioned ? ?Home Living   ?  ?  ?  ?  ?  ?  ?  ?  ?  ?  ?  ?  ?  ?  ?  ?  ?  ?  ? ?  ?Prior Functioning/Environment    ?  ?  ?  ?   ? ?Frequency ? Min 2X/week  ? ? ? ? ?  ?Progress Toward Goals ? ?OT Goals(current goals can now be found in the care plan section) ? Progress towards OT goals: Progressing toward goals ? ?Acute Rehab OT Goals ?OT Goal Formulation: Patient unable to participate in goal setting ?Time For Goal Achievement: 06/06/21 ?Potential to Achieve Goals: Fair ?ADL Goals ?Pt Will Perform Eating: with set-up;sitting ?Pt Will Perform Grooming: with set-up;with supervision;sitting;standing ?Pt Will Perform Lower Body Bathing: with supervision;sit to/from stand ?Pt Will Perform Upper Body Dressing: with set-up;sitting ?Pt Will Perform Lower Body Dressing: with supervision;sit to/from stand ?Pt Will Transfer to Toilet: with supervision;ambulating ?Pt Will Perform Toileting - Clothing Manipulation and hygiene: with modified independence;sit to/from stand ?Additional ADL Goal #1: Pt will verbalie 3 strateiges to manage heart failure to reduce risk of readmission  ?Plan Discharge plan remains appropriate   ? ?Co-evaluation ? ? ? PT/OT/SLP Co-Evaluation/Treatment: Yes ?Reason for Co-Treatment: For patient/therapist safety;To address functional/ADL transfers ?  ?OT goals addressed during session: ADL's and self-care ?  ? ?  ?AM-PAC OT "6 Clicks" Daily Activity     ?Outcome Measure ? ? Help from another person eating meals?: A Little ?Help from another person taking care of personal grooming?: A Little ?Help from another person  toileting, which includes using toliet, bedpan, or urinal?: A Lot ?Help from another person bathing (including washing, rinsing, drying)?: A Lot ?Help from another person to put on and taking off regular upper body clothing?: A Little ?Help from another person to put on and taking off regular lower body clothing?: A Lot ?6 Click Score: 15 ? ?  ?End of Session Equipment Utilized During Treatment: Gait belt;Rolling [place] (2 wheels) ? ?OT Visit Diagnosis: Unsteadiness on feet (R26.81);Muscle weakness (generalized) (M62.81);Other symptoms and signs involving cognitive function ?  ?Activity Tolerance Patient tolerated treatment well ?  ?Patient Left in bed;with call bell/phone within reach;with chair alarm set;with family/visitor present ?  ?Nurse Communication Mobility status ?  ? ?   ? ?Time: 7253-6644 ?OT Time Calculation (min): 35 min ? ?Charges: OT General Charges ?$OT Visit: 1 Visit ?OT Treatments ?$Self Care/Home Management : 8-22 mins ? ?Alfonse Flavors, OTA ?Acute Rehabilitation Services  ?Pager [phone] ?Office [phone] ? ? ?Annasophia Crocker Jeannett Senior ?05/27/2021, 11:14 AM ?

## 2021-05-27 NOTE — Progress Notes (Signed)
Physical Therapy Treatment ?Patient Details ?Name: Matthew [name] ?MRN: [id number] ?DOB: 02/28/1940 ?Today's Date: 05/27/2021 ? ? ?History of Present Illness 81 y.o. male adm 3/31 with SOB with RLL PE treated with Heparin> Eliquis. Admission 3/25-28/2023 with AMS and UTI. L radial heart cath completed on 4/5. PMHx: CAD, NICM, CHF, HTN, OA, obesity ? ?  ?PT Comments  ? ? The pt was agreeable to session with focus on progressing ambulation distance and endurance. He continues to require significant assist to complete transfers, as well as to steady and safely ambulate. He is limited to ~15 ft at a time with min-modA of 2 for safety. He ambulates with significant trunk and bilateral knee flexion, needing max cues for posture and positioning in RW with minimal adjustments made by the pt. He continues to be appropriate for acute inpatient rehab after d/c due to significant level of assist needed, pt and family agreeable to plan.  ?  ?Recommendations for follow up therapy are one component of a multi-disciplinary discharge planning process, led by the attending physician.  Recommendations may be updated based on patient status, additional functional criteria and insurance authorization. ? ?Follow Up Recommendations ? Acute inpatient rehab (3hours/day) ?  ?  ?Assistance Recommended at Discharge Frequent or constant Supervision/Assistance  ?Patient can return home with the following A little help with bathing/dressing/bathroom;Assistance with cooking/housework;Assist for transportation;Help with stairs or ramp for entrance;A lot of help with walking and/or transfers ?  ?Equipment Recommendations ? None recommended by PT  ?  ?Recommendations for Other Services   ? ? ?  ?Precautions / Restrictions Precautions ?Precautions: Other (comment);Fall ?Precaution Comments: L radial heart cath on 4/5 ?Restrictions ?Weight Bearing Restrictions: No  ?  ? ?Mobility ? Bed Mobility ?Overal bed mobility: Needs Assistance ?Bed Mobility:  Supine to Sit ?  ?  ?Supine to sit: Min guard, HOB elevated ?  ?  ?General bed mobility comments: increased time to perform with instructions for hand movement. max cues to reach edge of bed ?  ? ?Transfers ?Overall transfer level: Needs assistance ?Equipment used: Rolling Mustard (2 wheels) ?Transfers: Sit to/from Stand ?Sit to Stand: Min assist, +2 physical assistance, +2 safety/equipment, Mod assist, From elevated surface ?  ?  ?  ?  ?  ?General transfer comment: min-modA of 2 to rise, poor power through LE. max cues for trunk flexion with pt making minimal postural corrections. minA to steady in stance ?  ? ?Ambulation/Gait ?Ambulation/Gait assistance: Min assist, Mod assist, +2 physical assistance, +2 safety/equipment ?Gait Distance (Feet): 15 Feet (+ 70ft) ?Assistive device: Rolling Kollman (2 wheels) ?Gait Pattern/deviations: Trunk flexed, Step-through pattern, Decreased stride length, Knee flexed in stance - right, Knee flexed in stance - left, Knees buckling, Decreased dorsiflexion - left ?Gait velocity: reduced ?Gait velocity interpretation: <1.31 ft/sec, indicative of household ambulator ?  ?General Gait Details: Pt with slow, very flexed posture despite use of bil platform [place] to facilitate upright posture. Verbal and tactile cues also provided to improve posture with min success. Pt with inability to perform L heel strike and get foot flat during gait. MinAx2 for balance and safety initially. When pt attempted to turn around to sit in platform [place], his knees buckled and he required modAx2 to recover and safely transition pt to the seat of the [place]. ? ? ? ?  ?Balance Overall balance assessment: Needs assistance ?Sitting-balance support: Feet supported ?Sitting balance-[name] Scale: Good ?  ?  ?Standing balance support: Single extremity supported, Bilateral upper extremity supported, During functional  activity ?Standing balance-[name] Scale: Poor ?Standing balance comment: reliant on [place] and sink  for balance. able to use RUE to wash face standing at sink ?  ?  ?  ?  ?  ?  ?  ?  ?  ?  ?  ?  ? ?  ?Cognition Arousal/Alertness: Awake/alert ?Behavior During Therapy: Flat affect ?Overall Cognitive Status: Impaired/Different from baseline ?Area of Impairment: Attention, Memory, Following commands, Safety/judgement, Problem solving ?  ?  ?  ?  ?  ?  ?  ?  ?  ?Current Attention Level: Focused ?Memory: Decreased recall of precautions, Decreased short-term memory ?Following Commands: Follows one step commands inconsistently, Follows one step commands with increased time ?Safety/Judgement: Decreased awareness of safety, Decreased awareness of deficits ?  ?Problem Solving: Slow processing, Decreased initiation, Difficulty sequencing, Requires verbal cues ?General Comments: increased time to follow all commands, poor attention to cues in distracting environment, difficult to discern at times cog vs vision vs hearing deficits. ?  ?  ? ?  ?Exercises   ? ?  ?General Comments General comments (skin integrity, edema, etc.): VSS on RA ?  ?  ? ?Pertinent Vitals/Pain Pain Assessment ?Pain Assessment: Faces ?Faces Pain Scale: Hurts a little bit ?Pain Location: none stated, general grimacing, ?Pain Descriptors / Indicators: Discomfort ?Pain Intervention(s): Limited activity within patient's tolerance, Monitored during session, Repositioned  ? ? ? ?PT Goals (current goals can now be found in the care plan section) Acute Rehab PT Goals ?PT Goal Formulation: With patient ?Time For Goal Achievement: 05/30/21 ?Potential to Achieve Goals: Fair ?Progress towards PT goals: Progressing toward goals ? ?  ?Frequency ? ? ? Min 3X/week ? ? ? ?  ?PT Plan Current plan remains appropriate  ? ? ?Co-evaluation PT/OT/SLP Co-Evaluation/Treatment: Yes ?Reason for Co-Treatment: Necessary to address cognition/behavior during functional activity;For patient/therapist safety;To address functional/ADL transfers ?PT goals addressed during session:  Mobility/safety with mobility;Balance;Proper use of DME;Strengthening/ROM ?OT goals addressed during session: ADL's and self-care ?  ? ?  ?AM-PAC PT "6 Clicks" Mobility   ?Outcome Measure ? Help needed turning from your back to your side while in a flat bed without using bedrails?: A Little ?Help needed moving from lying on your back to sitting on the side of a flat bed without using bedrails?: A Little ?Help needed moving to and from a bed to a chair (including a wheelchair)?: A Lot ?Help needed standing up from a chair using your arms (e.g., wheelchair or bedside chair)?: A Lot ?Help needed to walk in hospital room?: A Lot ?Help needed climbing 3-5 steps with a railing? : Total ?6 Click Score: 13 ? ?  ?End of Session Equipment Utilized During Treatment: Gait belt ?Activity Tolerance: Patient tolerated treatment well;Patient limited by fatigue ?Patient left: with call bell/phone within reach;in chair;with chair alarm set ?Nurse Communication: Mobility status ?PT Visit Diagnosis: Muscle weakness (generalized) (M62.81);Unsteadiness on feet (R26.81);Other abnormalities of gait and mobility (R26.89);Difficulty in walking, not elsewhere classified (R26.2) ?  ? ? ?Time: [id number] ?PT Time Calculation (min) (ACUTE ONLY): 36 min ? ?Charges:  $Therapeutic Exercise: 8-22 mins          ?          ? ?Vickki Muff, PT, DPT  ? ?Acute Rehabilitation Department ?Pager #: [phone] - [phone] ? ? ?Ronnie Derby ?05/27/2021, 12:43 PM ? ?

## 2021-05-27 NOTE — Progress Notes (Addendum)
? ? Advanced Heart Failure Rounding Note ? ?PCP-Cardiologist: Larae Grooms, MD  ? ?Subjective:   ? ?04/05: RHC Low filling pressures with normal CO.  ?04/06: Seen by structural team for consideration of mitral TEER ?04/12: Started on milrinone 0.125, lactic acid 2.8>>1.7>1.6 ? ?Co-ox 81% this am on 0.125 milrinone. ? ?CVP 6-7. 1L UOP charted yesterday with IV lasix 40 BID.  ? ?Scr 1.7>2.0>2.39>2.30>2.17 ? ?Son present at bedside. Denies dyspnea, orthopnea, and PND. ? ? ? ?TEE:  Mild restriction of P2. Severe central/posterior MR due to P2 restriction and annular dilation. EF 15-20%, LV severely dilated w/ severe HK. RV dilated + severe HK. Mod TR  ? ?R/LHCAssessment: ?1. Mild non-obstructive CAD ?2. NICM EF < 20% ?3. Low filling pressures with normal cardiac output ?4. No significant v-waves in PCWP tracing despite presence of severe MR on TEE (may be influenced by low BP and filling pressures) ? ?Ao = 84/60 (70) ?LV = 83/6 ?RA = 1 ?RV = 20/5 ?PA = 21/7 (14) ?PCW = 3 (no prominent v waves) ?Fick cardiac output/index = 6.1/3.0 ?PVR = 1.8 WU ?Ao sat = 96% ?PA sat = 71%, 74% ? ? ?cMRI 05/20/21 ? ?IMPRESSION: ?1. Limited study as patient refused to continue shortly after ?contrast administration. Single shot LGE images and post contrast T1 ?maps were obtained, but high resolution LGE images and flow ?quantification to assess mitral/tricuspid regurgitation were not ?done ?  ?2. Unable to quantify volumes due to significant motion artifact on ?cine images likely from frequent PVCs, but visually LV is severely ?dilated with severe systolic dysfunction ?  ?3. Visually RV is severely dilated with moderate systolic ?dysfunction ?  ?4. Basal septal midwall late gadolinium enhancement, which is a scar ?pattern seen in nonischemic cardiomyopathies and associated with ?worse prognosis ?  ?5. RV insertion site LGE, which is a nonspecific finding often seen ?in setting of elevated pulmonary pressures ?  ?6.  Ascending aortic  aneurysm measuring 42mm ? ?Objective:   ?Weight Range: ?86.4 kg ?Body mass index is 21.46 kg/m?.  ? ?Vital Signs:   ?Temp:  [97.6 ?F (36.4 ?C)] 97.6 ?F (36.4 ?C) (04/14 [id number]) ?Pulse Rate:  [77-100] 82 (04/14 0433) ?Resp:  [16-21] 16 (04/14 0433) ?BP: (100-154)/(67-142) 100/67 (04/14 0433) ?SpO2:  [97 %-99 %] 98 % (04/13 2034) ?Weight:  [86.4 kg] 86.4 kg (04/14 0624) ?Last BM Date : 05/23/21 ? ?Weight change: ?Filed Weights  ? 05/25/21 0419 05/26/21 0409 05/27/21 0624  ?Weight: 86.2 kg 87.8 kg 86.4 kg  ? ? ?Intake/Output:  ? ?Intake/Output Summary (Last 24 hours) at 05/27/2021 1109 ?Last data filed at 05/27/2021 [id number] ?Gross per 24 hour  ?Intake 270 ml  ?Output 1300 ml  ?Net -1030 ml  ? ?  ? ? ?Physical Exam  ? ?CVP 6-7 ?General:  No distress. Sitting up in chair. Son present. ?HEENT: normal ?Neck: supple. JVP 7-8 cm. Carotids 2+ bilat; no bruits.  ?Cor: PMI nondisplaced. Regular rate & rhythm. No rubs, gallops, 3/6 MR murmur ?Lungs: clear ?Abdomen: soft, nontender, nondistended. No hepatosplenomegaly.  ?Extremities: no cyanosis, clubbing, rash, trace edema, + RUE PICC ?Neuro: alert & orientedx3, cranial nerves grossly intact. moves all 4 extremities w/o difficulty. Affect pleasant ? ? ? ?Telemetry  ? ?SR 80s, 30-40 PVCs/min ? ?Labs  ?  ?CBC ?Recent Labs  ?  05/24/21 ?1112 05/25/21 ?1313  ?WBC 4.0 4.0  ?HGB 11.9* 12.8*  ?HCT 37.8* 39.5  ?MCV 79.6* 80.6  ?PLT 159 167  ? ? ?Basic Metabolic Panel ?  Recent Labs  ?  05/26/21 ?0451 05/27/21 ?[id number]  ?NA 131* 133*  ?K 4.7 4.3  ?CL 99 98  ?CO2 23 25  ?GLUCOSE 70 73  ?BUN 38* 38*  ?CREATININE 2.30* 2.17*  ?CALCIUM 8.9 9.0  ?MG 2.1 2.3  ? ?Liver Function Tests ?No results for input(s): AST, ALT, ALKPHOS, BILITOT, PROT, ALBUMIN in the last 72 hours. ?No results for input(s): LIPASE, AMYLASE in the last 72 hours. ?Cardiac Enzymes ?No results for input(s): CKTOTAL, CKMB, CKMBINDEX, TROPONINI in the last 72 hours. ? ?BNP: ?BNP (last 3 results) ?Recent Labs  ?  05/09/21 ?[id number]  05/10/21 ?0205 05/13/21 ?2142  ?BNP >4,500.0* >4,500.0* >4,500.0*  ? ? ?ProBNP (last 3 results) ?No results for input(s): PROBNP in the last 8760 hours. ? ? ?D-Dimer ?No results for input(s): DDIMER in the last 72 hours. ?Hemoglobin A1C ?No results for input(s): HGBA1C in the last 72 hours. ?Fasting Lipid Panel ?No results for input(s): CHOL, HDL, LDLCALC, TRIG, CHOLHDL, LDLDIRECT in the last 72 hours. ?Thyroid Function Tests ?No results for input(s): TSH, T4TOTAL, T3FREE, THYROIDAB in the last 72 hours. ? ?Invalid input(s): FREET3 ? ?Other results: ? ? ?Imaging  ? ? ?No results found. ? ? ?Medications:   ? ? ?Scheduled Medications: ? amiodarone  400 mg Oral BID  ? apixaban  5 mg Oral BID  ? atorvastatin  10 mg Oral Daily  ? Chlorhexidine Gluconate Cloth  6 each Topical Daily  ? docusate sodium  100 mg Oral Daily  ? furosemide  40 mg Intravenous BID  ? leptospermum manuka honey  1 application. Topical Daily  ? magnesium oxide  400 mg Oral Daily  ? mexiletine  300 mg Oral Q12H  ? midodrine  10 mg Oral TID WC  ? sodium chloride flush  3 mL Intravenous Q12H  ? sodium chloride flush  3 mL Intravenous Q12H  ? ? ?Infusions: ? sodium chloride    ? milrinone 0.125 mcg/kg/min (05/26/21 1320)  ? ? ?PRN Medications: ?sodium chloride, acetaminophen, ondansetron (ZOFRAN) IV, sodium chloride flush, sodium chloride flush ? ? ? ?Patient Profile  ? ?81 y.o. male with history of chronic biventricular HF, NICM, CAD, severe MR/TR, HTN, obesity and multiple recent admissions, now admitted w/ acute PE and a/c CHF.  ? ?Echo this admission, EF 25%, mildly dilated RV with moderately decreased systolic function, severe MR and TR. IVC dilated.  ? ?Assessment/Plan  ? ?1. Acute on chronic Biventricular HF   ?- ECHO 04/2020 EF 25-30% with mild MR.   ?- Echo 04/04/21 EF LVEF 20-25%, RV moderately reduced, LA/RA severely dilated, and severe MR.  ?- Echo per Dr. 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 ?- cMRI c/w severe NICM w/ basal septal midwall LGE  ?- Elevated lactic acid 2.8 on 04/12. Now on milrinone 0.125 with improvement in lactic acidosis. Co-ox okay. Will plan to wean milrinone over the weekend. ?- CVP 6-7 today. Switch IV lasix to po Torsemide 40 mg daily ?- GDMT limited d/t hypotension and AKI. Continue midodrine 10 mg TID for help w/ renal perfusion  ?- Off Spiro w/ elevated K and SCr  ? ?2. Lactic acidosis ?- Suspect d/t low-output HF ?- 2.8>>1.7 after  milrinone added ?- BC with NG X 2 day, procalcitonin < 0.10 X 2 ?- No leukocytosis, AF, CXR 04/12 with no acute process ? ?3. CAD ?- LHC w/ mild non-obstructive CAD ?- Continue statin ?  ?4. Mitral Regurgitation ?- Severe on echo this admit and last echo 02/23 ?- TEE showed mild restriction of P2. Severe central/posterior MR due to P2 restriction and annular dilation ?- functional, severely dilated LA and LV  ?- Seen by structural team. Considering mitral TEER. Next procedure date 04/20.  ?  ?5. Tricuspid Regurgitation  ?- moderate on TEE  ?- functional, severely dilated RA and RV   ?- HF optimization per above  ?  ?6. Acute PE ?- Continue Eliquis. ?- No evidence of DVT on [place] ?- ? 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  ?7. Hyperlipidemia ?- Continue statin ?  ?8. AKI on CKD IIIa ?- Scr  baseline 1.4-1.7 last few months, this admit 1.4>>1.51.76>>2.02>2.1>2.39>2.30>2.17. Suspect cardiorenal syndrome. ?- Now appears to be stabilizing with addition of milrinone. ?- follow BMP  ?  ?9. NSVT/PVCs ?- suspect contributing to CM , need to suppress. ?- Still with very frequent PVCs. ?- continue mexiletine 300 mg BID  ?- seen by EP 04/12,  amiodarone increased to 400 mg BID. Not felt to be a candidate for any invasive procedures. ?- Keep K > 4 and Mag > 2 ? ?10. Hyponatremia ?- Na slowing drifting down, 132>>130>>129>>131>>131>>133 ?- worry about traje

## 2021-05-28 DIAGNOSIS — N179 Acute kidney failure, unspecified: Secondary | ICD-10-CM | POA: Diagnosis not present

## 2021-05-28 DIAGNOSIS — E785 Hyperlipidemia, unspecified: Secondary | ICD-10-CM | POA: Diagnosis not present

## 2021-05-28 DIAGNOSIS — I2699 Other pulmonary embolism without acute cor pulmonale: Secondary | ICD-10-CM | POA: Diagnosis not present

## 2021-05-28 DIAGNOSIS — I5023 Acute on chronic systolic (congestive) heart failure: Secondary | ICD-10-CM | POA: Diagnosis not present

## 2021-05-28 LAB — BASIC METABOLIC PANEL
Anion gap: 6 (ref 5–15)
BUN: 38 mg/dL — ABNORMAL HIGH (ref 8–23)
CO2: 27 mmol/L (ref 22–32)
Calcium: 9 mg/dL (ref 8.9–10.3)
Chloride: 100 mmol/L (ref 98–111)
Creatinine, Ser: 2.35 mg/dL — ABNORMAL HIGH (ref 0.61–1.24)
GFR, Estimated: 27 mL/min — ABNORMAL LOW (ref >60)
Glucose, Bld: 88 mg/dL (ref 70–99)
Potassium: 4.5 mmol/L (ref 3.5–5.1)
Sodium: 133 mmol/L — ABNORMAL LOW (ref 135–145)

## 2021-05-28 LAB — COOXEMETRY PANEL
Carboxyhemoglobin: 2 % — ABNORMAL HIGH (ref 0.5–1.5)
Methemoglobin: 0.8 % (ref 0.0–1.5)
O2 Saturation: 81 %
Total hemoglobin: 12.3 g/dL (ref 12.0–16.0)

## 2021-05-28 LAB — MAGNESIUM: Magnesium: 2.1 mg/dL (ref 1.7–2.4)

## 2021-05-28 NOTE — Progress Notes (Signed)
? ? Advanced Heart Failure Rounding Note ? ?PCP-Cardiologist: Lance Muss, MD  ? ?Subjective:   ? ?04/05: RHC Low filling pressures with normal CO.  ?04/06: Seen by structural team for consideration of mitral TEER ?04/12: Started on milrinone 0.125, lactic acid 2.8>>1.7>1.6 ? ?Co-ox remains 81% this am on 0.125 milrinone. On torsemide 40 daily. Scr back up 2.17 -> 2.35. Weight unchanged. CVP 4 ? ?Denies CP or SOB.  ? ?Still with frequent PVCs.  ? ? ? ?TEE:  Mild restriction of P2. Severe central/posterior MR due to P2 restriction and annular dilation. EF 15-20%, LV severely dilated w/ severe HK. RV dilated + severe HK. Mod TR  ? ?R/LHCAssessment: ?1. Mild non-obstructive CAD ?2. NICM EF < 20% ?3. Low filling pressures with normal cardiac output ?4. No significant v-waves in PCWP tracing despite presence of severe MR on TEE (may be influenced by low BP and filling pressures) ? ?Ao = 84/60 (70) ?LV = 83/6 ?RA = 1 ?RV = 20/5 ?PA = 21/7 (14) ?PCW = 3 (no prominent v waves) ?Fick cardiac output/index = 6.1/3.0 ?PVR = 1.8 WU ?Ao sat = 96% ?PA sat = 71%, 74% ? ? ?cMRI 05/20/21 ? ?IMPRESSION: ?1. Limited study as patient refused to continue shortly after ?contrast administration. Single shot LGE images and post contrast T1 ?maps were obtained, but high resolution LGE images and flow ?quantification to assess mitral/tricuspid regurgitation were not ?done ?  ?2. Unable to quantify volumes due to significant motion artifact on ?cine images likely from frequent PVCs, but visually LV is severely ?dilated with severe systolic dysfunction ?  ?3. Visually RV is severely dilated with moderate systolic ?dysfunction ?  ?4. Basal septal midwall late gadolinium enhancement, which is a scar ?pattern seen in nonischemic cardiomyopathies and associated with ?worse prognosis ?  ?5. RV insertion site LGE, which is a nonspecific finding often seen ?in setting of elevated pulmonary pressures ?  ?6.  Ascending aortic aneurysm measuring  8mm ? ?Objective:   ?Weight Range: ?86.4 kg ?Body mass index is 21.46 kg/m?.  ? ?Vital Signs:   ?Temp:  [97.5 ?F (36.4 ?C)-97.8 ?F (36.6 ?C)] 97.5 ?F (36.4 ?C) (04/15 0418) ?Pulse Rate:  [100] 100 (04/15 0418) ?Resp:  [18] 18 (04/15 0418) ?BP: (98-129)/(72-94) 129/94 (04/15 0418) ?SpO2:  [96 %] 96 % (04/14 2052) ?Weight:  [86.4 kg] 86.4 kg (04/15 0418) ?Last BM Date : 05/23/21 ? ?Weight change: ?Filed Weights  ? 05/26/21 0409 05/27/21 0624 05/28/21 0418  ?Weight: 87.8 kg 86.4 kg 86.4 kg  ? ? ?Intake/Output:  ? ?Intake/Output Summary (Last 24 hours) at 05/28/2021 1201 ?Last data filed at 05/28/2021 5277 ?Gross per 24 hour  ?Intake 626.35 ml  ?Output 2050 ml  ?Net -1423.65 ml  ? ? ?  ? ? ?Physical Exam  ? ?General:  Elderly sitting in chair No resp difficulty ?HEENT: normal ?Neck: supple. no JVD. Carotids 2+ bilat; no bruits. No lymphadenopathy or thryomegaly appreciated. ?Cor: PMI nondisplaced. Irregular rate & rhythm. 2/6 MR ?Lungs: clear ?Abdomen: obese soft, nontender, nondistended. No hepatosplenomegaly. No bruits or masses. Good bowel sounds. ?Extremities: no cyanosis, clubbing, rash, edema ?Neuro: alert & orientedx3, cranial nerves grossly intact. moves all 4 extremities w/o difficulty. Affect pleasant ? ?Telemetry  ? ?SR 80-100 Frequent PVCs. Personally reviewed ? ?Labs  ?  ?CBC ?Recent Labs  ?  05/25/21 ?1313  ?WBC 4.0  ?HGB 12.8*  ?HCT 39.5  ?MCV 80.6  ?PLT 167  ? ? ? ?Basic Metabolic Panel ?Recent Labs  ?  05/27/21 ?3735 05/28/21 ?0435  ?NA 133* 133*  ?K 4.3 4.5  ?CL 98 100  ?CO2 25 27  ?GLUCOSE 73 88  ?BUN 38* 38*  ?CREATININE 2.17* 2.35*  ?CALCIUM 9.0 9.0  ?MG 2.3 2.1  ? ? ?Liver Function Tests ?No results for input(s): AST, ALT, ALKPHOS, BILITOT, PROT, ALBUMIN in the last 72 hours. ?No results for input(s): LIPASE, AMYLASE in the last 72 hours. ?Cardiac Enzymes ?No results for input(s): CKTOTAL, CKMB, CKMBINDEX, TROPONINI in the last 72 hours. ? ?BNP: ?BNP (last 3 results) ?Recent Labs  ?  05/09/21 ?7897  05/10/21 ?0205 05/13/21 ?2142  ?BNP >4,500.0* >4,500.0* >4,500.0*  ? ? ? ?ProBNP (last 3 results) ?No results for input(s): PROBNP in the last 8760 hours. ? ? ?D-Dimer ?No results for input(s): DDIMER in the last 72 hours. ?Hemoglobin A1C ?No results for input(s): HGBA1C in the last 72 hours. ?Fasting Lipid Panel ?No results for input(s): CHOL, HDL, LDLCALC, TRIG, CHOLHDL, LDLDIRECT in the last 72 hours. ?Thyroid Function Tests ?No results for input(s): TSH, T4TOTAL, T3FREE, THYROIDAB in the last 72 hours. ? ?Invalid input(s): FREET3 ? ?Other results: ? ? ?Imaging  ? ? ?No results found. ? ? ?Medications:   ? ? ?Scheduled Medications: ? amiodarone  400 mg Oral BID  ? apixaban  5 mg Oral BID  ? atorvastatin  10 mg Oral Daily  ? Chlorhexidine Gluconate Cloth  6 each Topical Daily  ? docusate sodium  100 mg Oral Daily  ? leptospermum manuka honey  1 application. Topical Daily  ? magnesium oxide  400 mg Oral Daily  ? mexiletine  300 mg Oral Q12H  ? midodrine  10 mg Oral TID WC  ? sodium chloride flush  3 mL Intravenous Q12H  ? sodium chloride flush  3 mL Intravenous Q12H  ? torsemide  40 mg Oral Daily  ? ? ?Infusions: ? sodium chloride    ? milrinone 0.125 mcg/kg/min (05/28/21 0124)  ? ? ?PRN Medications: ?sodium chloride, acetaminophen, ondansetron (ZOFRAN) IV, sodium chloride flush, sodium chloride flush ? ? ? ?Patient Profile  ? ?81 y.o. male with history of chronic biventricular HF, NICM, CAD, severe MR/TR, HTN, obesity and multiple recent admissions, now admitted w/ acute PE and a/c CHF.  ? ?Echo this admission, EF 25%, mildly dilated RV with moderately decreased systolic function, severe MR and TR. IVC dilated.  ? ?Assessment/Plan  ? ?1. Acute on chronic Biventricular HF   ?- ECHO 04/2020 EF 25-30% with mild MR.   ?- Echo 04/04/21 EF LVEF 20-25%, RV moderately reduced, LA/RA severely dilated, and severe MR.  ?- Echo per Dr. 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 ?- cMRI c/w severe NICM w/ basal septal midwall LGE  ?- Elevated lactic acid 2.8 on 04/12. Now on milrinone 0.125 with improvement in lactic acidosis. Co-ox high. Will plan to wean milrinone tomorrow if renal function stable ?- CVP 4 today. Continue Torsemide 40 mg daily ?- GDMT limited d/t hypotension and AKI. Continue midodrine 10 mg TID for help w/ renal perfusion  ?- Off Spiro w/ elevated K and SCr  ?- Remains on milrinone. Still with frequent PVCs. Discussed again with EP and they wil consider possible  PVC ablation next week if he is stabilized. Even if he does have PVC CM will take some time to reverse once PVCs are gone. May or may not be able to keep him stable off milrinone. We will see.  ? ?2. Lactic acidosis ?- Suspect d/t low-output HF ?- Resolved with milrinone ?- BCx NGTD procalcitonin < 0.10 X 2 ?- No leukocytosis, AF, CXR 04/12 with no acute process ? ?3. CAD ?- LHC w/ mild non-obstructive CAD ?- No s/s angina ?- Continue statin ?  ?4. Mitral Regurgitation ?- Severe on echo this admit and last echo 02/23 ?- TEE showed mild restriction of P2. Severe central/posterior MR due to P2 restriction and annular dilation ?- functional, severely dilated LA and LV  ?- Seen by structural team. Considering mitral TEER. Next procedure date 04/20.  ?  ?5. Tricuspid Regurgitation  ?- moderate on TEE  ?- functional, severely dilated RA and RV   ?- HF optimization per above  ?  ?6. Acute PE ?- Continue Eliquis. ?- No evidence of DVT on [place] ?- ? 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  ?7. Hyperlipidemia ?- Continue statin ?  ?8. AKI on CKD IIIa ?- Scr  baseline 1.4-1.7 last few months, this admit  1.4>>1.51.76>>2.02>2.1>2.39>2.30>2.17 > 2.35 Suspect cardiorenal syndrome. ?- Continue milrinone  ?- follow BMP  ?  ?9. NSVT/PVCs ?- suspect contributing to CM , need to suppress. ?- Still with very frequent PVCs

## 2021-05-28 NOTE — Progress Notes (Addendum)
?PROGRESS NOTE ? ? ? ?Matthew [name]  [id number] DOB: 06-14-1940 DOA: 05/13/2021 ?PCP: Pcp, No  ?Narrative 80/M with history of chronic systolic CHF, NICM, EF [id number], severe MR/TR, CAD, obesity, hypertension multiple hospitalizations with CHF, here with acute on chronic CHF and acute PE ?-Echo this admission noted EF 25%, moderately decreased systolic function, severe MR and TR ?-Right heart cath with low filling pressures, concern for low output state started on IV milrinone, diuresed with IV Lasix ?-Complicated by AKI ?-Also seen by structural heart team ? ?Subjective: Feels okay, denies any complaints ? ?Assessment and Plan: ? ?Acute bilateral pulmonary embolism  ?-Now on apixaban, stable ?-Wean off O2 ? ?Acute on chronic systolic congestive heart failure ([place]) ?-Echo this admission LV systolic function reduced to 25%, severe RV dilation, RVSP 35,6 mmHg, severe dilatation of right and left atrium. Severe mitral regurgitation and TR ?-Heart failure team following, right heart cath 4/5 with low filling pressures, normal cardiac output ?-Concern for low output state, started on IV milrinone 4/12 ?-Diuresed with IV Lasix he is 21.6 L negative ?-Now switched to p.o. torsemide ?-GDMT limited by hypotension and AKI ?-Continue midodrine, plan to wean tomorrow if creatinine is stable ? ?Severe MR, TR ?-TEE severe central and posterior leaflet MR, seen by structural heart team, considering mitral TEER ? ?Frequent PVCs, NSVT ?-Now on amiodarone and mexiletine ?-Possibly driving his cardiomyopathy, per EP may consider PVC ablation next week if stable ?-Electrolytes are okay ? ?Acute kidney injury superimposed on chronic kidney disease ([place]) ?CKD stage 3a, Hyponatremia. Hypomagnesemia. Hyperkalemia  ?-Baseline creatinine around 1.5-1.7, has trended up to 2.3, likely cardiorenal ?-Remains on milrinone and midodrine, avoid hypotension ? ?Ascending aortic aneurysm ([place]) ?CT showing aneurysmal dilation of ascending aorta  measuring 4.5 cm.  ?-Radiologist recommending semi-annual imaging followup by CTA or MRA ?-Outpatient cardiothoracic surgery follow-up ? ?DVT prophylaxis: Apixaban ?Code Status: Full code ?Family Communication: Discussed with patient in detail, no family at bedside ?Disposition Plan: CIR in 48 hours if stable ? ?Consultants:  ?Heart failure ? ?Procedures:  ? ?Antimicrobials:  ? ? ?Objective: ?Vitals:  ? 05/27/21 0433 05/27/21 [id number] 05/27/21 2052 05/28/21 0418  ?BP: 100/67  98/72 (!) 129/94  ?Pulse: 82   100  ?Resp: 16   18  ?Temp: 97.6 ?F (36.4 ?C)  97.8 ?F (36.6 ?C) (!) 97.5 ?F (36.4 ?C)  ?TempSrc: Oral   Oral  ?SpO2:   96%   ?Weight:  86.4 kg  86.4 kg  ?Height:      ? ? ?Intake/Output Summary (Last 24 hours) at 05/28/2021 1355 ?Last data filed at 05/28/2021 [id number] ?Gross per 24 hour  ?Intake 626.35 ml  ?Output 1550 ml  ?Net -923.65 ml  ? ?Filed Weights  ? 05/26/21 0409 05/27/21 0624 05/28/21 0418  ?Weight: 87.8 kg 86.4 kg 86.4 kg  ? ? ?Examination: ? ?General exam: Pleasant elderly male sitting up in chair, AAO x2, hard of hearing ?CVS: S1-S2, irregularly irregular rhythm ?Lungs: Clear bilaterally ?Abdomen: Soft, nontender, bowel sounds present ?Extremities: Trace edema, right foot wound with dressing ?Psychiatry:Mood & affect appropriate.  ? ? ? ?Data Reviewed:  ? ?CBC: ?Recent Labs  ?Lab 05/24/21 ?1112 05/25/21 ?1313  ?WBC 4.0 4.0  ?HGB 11.9* 12.8*  ?HCT 37.8* 39.5  ?MCV 79.6* 80.6  ?PLT 159 167  ? ?Basic Metabolic Panel: ?Recent Labs  ?Lab 05/24/21 ?[id number] 05/25/21 ?[id number] 05/25/21 ?1115 05/26/21 ?0451 05/27/21 ?[id number] 05/28/21 ?0435  ?NA 129* 131* 131* 131* 133* 133*  ?K 5.0 5.7* 5.5* 4.7 4.3 4.5  ?  CL 97* 100 97* 99 98 100  ?CO2 24 18* 22 23 25 27   ?GLUCOSE 103* 76 123* 70 73 88  ?BUN 30* 34* 35* 38* 38* 38*  ?CREATININE 2.02* 2.10* 2.39* 2.30* 2.17* 2.35*  ?CALCIUM 9.2 9.3 9.5 8.9 9.0 9.0  ?MG 2.2 2.3  --  2.1 2.3 2.1  ? ?GFR: ?Estimated Creatinine Clearance: 30.6 mL/min (A) (by C-G formula based on SCr of 2.35 mg/dL  (H)). ?Liver Function Tests: ?No results for input(s): AST, ALT, ALKPHOS, BILITOT, PROT, ALBUMIN in the last 168 hours. ?No results for input(s): LIPASE, AMYLASE in the last 168 hours. ?No results for input(s): AMMONIA in the last 168 hours. ?Coagulation Profile: ?No results for input(s): INR, PROTIME in the last 168 hours. ?Cardiac Enzymes: ?No results for input(s): CKTOTAL, CKMB, CKMBINDEX, TROPONINI in the last 168 hours. ?BNP (last 3 results) ?No results for input(s): PROBNP in the last 8760 hours. ?HbA1C: ?No results for input(s): HGBA1C in the last 72 hours. ?CBG: ?No results for input(s): GLUCAP in the last 168 hours. ?Lipid Profile: ?No results for input(s): CHOL, HDL, LDLCALC, TRIG, CHOLHDL, LDLDIRECT in the last 72 hours. ?Thyroid Function Tests: ?No results for input(s): TSH, T4TOTAL, FREET4, T3FREE, THYROIDAB in the last 72 hours. ?Anemia Panel: ?No results for input(s): VITAMINB12, FOLATE, FERRITIN, TIBC, IRON, RETICCTPCT in the last 72 hours. ?Urine analysis: ?   ?Component Value Date/Time  ? [place] YELLOW 05/25/2021 1911  ? APPEARANCEUR HAZY (A) 05/25/2021 1911  ? LABSPEC 1.008 05/25/2021 1911  ? PHURINE 5.0 05/25/2021 1911  ? [place] NEGATIVE 05/25/2021 1911  ? HGBUR SMALL (A) 05/25/2021 1911  ? [place] NEGATIVE 05/25/2021 1911  ? [place] NEGATIVE 05/25/2021 1911  ? [place] NEGATIVE 05/25/2021 1911  ? NITRITE NEGATIVE 05/25/2021 1911  ? LEUKOCYTESUR SMALL (A) 05/25/2021 1911  ? ?Sepsis Labs: ?@[email or web address] (procalcitonin:4,lacticidven:4) ? ?) ?Recent Results (from the past 240 hour(s))  ?Culture, blood (routine x 2)     Status: None (Preliminary result)  ? Collection Time: 05/25/21  1:42 PM  ? Specimen: BLOOD  ?Result Value Ref Range Status  ? Specimen Description BLOOD BLOOD LEFT ARM  Final  ? Special Requests   Final  ?  BOTTLES DRAWN AEROBIC AND ANAEROBIC Blood Culture adequate volume  ? Culture   Final  ?  NO GROWTH 3 DAYS ?Performed at [facility], [place] [address]  [place], [place] [address] ?  ? Report Status PENDING  Incomplete  ?Culture, blood (routine x 2)     Status: None (Preliminary result)  ? Collection Time: 05/25/21  1:47 PM  ? Specimen: BLOOD  ?Result Value Ref Range Status  ? Specimen Description BLOOD BLOOD LEFT HAND  Final  ? Special Requests   Final  ?  BOTTLES DRAWN AEROBIC AND ANAEROBIC Blood Culture adequate volume  ? Culture   Final  ?  NO GROWTH 3 DAYS ?Performed at [facility], [place] [address] [place], [place] [address] ?  ? Report Status PENDING  Incomplete  ?  ? ?Radiology Studies: ?No results found. ? ? ?Scheduled Meds: ? amiodarone  400 mg Oral BID  ? apixaban  5 mg Oral BID  ? atorvastatin  10 mg Oral Daily  ? Chlorhexidine Gluconate Cloth  6 each Topical Daily  ? docusate sodium  100 mg Oral Daily  ? leptospermum manuka honey  1 application. Topical Daily  ? magnesium oxide  400 mg Oral Daily  ? mexiletine  300 mg Oral Q12H  ? midodrine  10  mg Oral TID WC  ? sodium chloride flush  3 mL Intravenous Q12H  ? sodium chloride flush  3 mL Intravenous Q12H  ? torsemide  40 mg Oral Daily  ? ?Continuous Infusions: ? sodium chloride    ? milrinone 0.125 mcg/kg/min (05/28/21 0124)  ? ? ? LOS: 14 days  ? ? ?Time spent: 6min ? ?Domenic Polite, MD ?[facility] ? ? ?05/28/2021, 1:55 PM  ?  ?

## 2021-05-29 DIAGNOSIS — N179 Acute kidney failure, unspecified: Secondary | ICD-10-CM | POA: Diagnosis not present

## 2021-05-29 DIAGNOSIS — I5023 Acute on chronic systolic (congestive) heart failure: Secondary | ICD-10-CM | POA: Diagnosis not present

## 2021-05-29 DIAGNOSIS — I2699 Other pulmonary embolism without acute cor pulmonale: Secondary | ICD-10-CM | POA: Diagnosis not present

## 2021-05-29 DIAGNOSIS — N189 Chronic kidney disease, unspecified: Secondary | ICD-10-CM | POA: Diagnosis not present

## 2021-05-29 LAB — COOXEMETRY PANEL
Carboxyhemoglobin: 2 % — ABNORMAL HIGH (ref 0.5–1.5)
Methemoglobin: 0.8 % (ref 0.0–1.5)
O2 Saturation: 79.3 %
Total hemoglobin: 12.1 g/dL (ref 12.0–16.0)

## 2021-05-29 LAB — BASIC METABOLIC PANEL
Anion gap: 8 (ref 5–15)
BUN: 37 mg/dL — ABNORMAL HIGH (ref 8–23)
CO2: 27 mmol/L (ref 22–32)
Calcium: 8.9 mg/dL (ref 8.9–10.3)
Chloride: 100 mmol/L (ref 98–111)
Creatinine, Ser: 2.19 mg/dL — ABNORMAL HIGH (ref 0.61–1.24)
GFR, Estimated: 30 mL/min — ABNORMAL LOW (ref >60)
Glucose, Bld: 78 mg/dL (ref 70–99)
Potassium: 4.5 mmol/L (ref 3.5–5.1)
Sodium: 135 mmol/L (ref 135–145)

## 2021-05-29 LAB — MAGNESIUM: Magnesium: 1.8 mg/dL (ref 1.7–2.4)

## 2021-05-29 MED ORDER — POLYETHYLENE GLYCOL 3350 17 G PO PACK
17.0000 g | PACK | Freq: Every day | ORAL | Status: DC
  Administered 2021-05-29: 17 g via ORAL
  Filled 2021-05-29: qty 1

## 2021-05-29 MED ORDER — PANTOPRAZOLE SODIUM 40 MG PO TBEC
40.0000 mg | DELAYED_RELEASE_TABLET | Freq: Every day | ORAL | Status: DC
  Administered 2021-05-29 – 2021-06-01 (×4): 40 mg via ORAL
  Filled 2021-05-29 (×4): qty 1

## 2021-05-29 NOTE — Progress Notes (Signed)
?PROGRESS NOTE ? ? ? ?Matthew [name]  [id number] DOB: 08/03/1940 DOA: 05/13/2021 ?PCP: Pcp, No  ?Narrative 80/M with history of chronic systolic CHF, NICM, EF 123456, severe MR/TR, CAD, obesity, hypertension multiple hospitalizations with CHF, here with acute on chronic CHF and acute PE ?-Echo this admission noted EF 25%, moderately decreased systolic function, severe MR and TR ?-Right heart cath with low filling pressures, concern for low output state started on IV milrinone, diuresed with IV Lasix ?-Complicated by AKI ?-Also seen by structural heart team ? ?Subjective: Feels okay, denies any complaints ? ?Assessment and Plan: ? ?Acute bilateral pulmonary embolism  ?-Now on apixaban, stable ?-Wean O2 ? ?Acute on chronic systolic congestive heart failure ([place]) ?-Echo this admission LV systolic function reduced to 25%, severe RV dilation, RVSP 35,6 mmHg, severe dilatation of right and left atrium. Severe mitral regurgitation and TR ?-Heart failure team following, right heart cath 4/5 with low filling pressures, normal cardiac output ?-Concern for low output state, started on IV milrinone 4/12 ?-Diuresed with IV Lasix he is 18.4 L negative ?-Now switched to p.o. torsemide, continue midodrine ?-GDMT limited by hypotension and AKI ?-Heart failure team following, wean milrinone? ?-Plan for CIR in 1 to 2 days ? ?Severe MR, TR ?-TEE severe central and posterior leaflet MR, seen by structural heart team, considering mitral TEER ? ?Frequent PVCs, NSVT ?-Now on amiodarone and mexiletine ?-Possibly driving his cardiomyopathy, per EP may consider PVC ablation next week if stable ?-Electrolytes are okay ? ?Acute kidney injury superimposed on chronic kidney disease ([place]) ?CKD stage 3a, Hyponatremia. Hypomagnesemia. Hyperkalemia  ?-Baseline creatinine around 1.5-1.7, trended up to 2.3, likely cardiorenal, improving ?-Remains on milrinone and midodrine, avoid hypotension ? ?Ascending aortic aneurysm ([place]) ?CT showing aneurysmal  dilation of ascending aorta measuring 4.5 cm.  ?-Radiologist recommending semi-annual imaging followup by CTA or MRA ?-Outpatient cardiothoracic surgery follow-up ? ?DVT prophylaxis: Apixaban ?Code Status: Full code ?Family Communication: Discussed with patient in detail, no family at bedside ?Disposition Plan: CIR in 48 hours if stable ? ?Consultants:  ?Heart failure ? ?Procedures:  ? ?Antimicrobials:  ? ? ?Objective: ?Vitals:  ? 05/29/21 0547 05/29/21 0615 05/29/21 0813 05/29/21 1241  ?BP:  99/62 110/71 95/77  ?Pulse:  63 87 86  ?Resp:  15 15 18   ?Temp:  (!) 97.5 ?F (36.4 ?C)    ?TempSrc:  Oral    ?SpO2: 98%  96% 98%  ?Weight:  84.8 kg    ?Height:      ? ? ?Intake/Output Summary (Last 24 hours) at 05/29/2021 1324 ?Last data filed at 05/29/2021 1300 ?Gross per 24 hour  ?Intake 250 ml  ?Output 1430 ml  ?Net -1180 ml  ? ?Filed Weights  ? 05/27/21 0624 05/28/21 0418 05/29/21 0615  ?Weight: 86.4 kg 86.4 kg 84.8 kg  ? ? ?Examination: ? ?General exam: Pleasant elderly male sitting up in bed, AAO x2, hard of hearing ?CVS: S1-S2, irregularly irregular rhythm ?Lungs: Clear bilaterally ?Abdomen: Soft, nontender, bowel sounds present ?Extremities: Trace edema, right foot wound with dressing ?Psychiatry:Mood & affect appropriate.  ? ? ? ?Data Reviewed:  ? ?CBC: ?Recent Labs  ?Lab 05/24/21 ?1112 05/25/21 ?1313  ?WBC 4.0 4.0  ?HGB 11.9* 12.8*  ?HCT 37.8* 39.5  ?MCV 79.6* 80.6  ?PLT 159 167  ? ?Basic Metabolic Panel: ?Recent Labs  ?Lab 05/25/21 ?0318 05/25/21 ?1115 05/26/21 ?0451 05/27/21 ?[id number] 05/28/21 ?[id number] 05/29/21 ?0725  ?NA 131* 131* 131* 133* 133* 135  ?K 5.7* 5.5* 4.7 4.3 4.5 4.5  ?CL 100  97* 99 98 100 100  ?CO2 18* 22 23 25 27 27   ?GLUCOSE 76 123* 70 73 88 78  ?BUN 34* 35* 38* 38* 38* 37*  ?CREATININE 2.10* 2.39* 2.30* 2.17* 2.35* 2.19*  ?CALCIUM 9.3 9.5 8.9 9.0 9.0 8.9  ?MG 2.3  --  2.1 2.3 2.1 1.8  ? ?GFR: ?Estimated Creatinine Clearance: 32.3 mL/min (A) (by C-G formula based on SCr of 2.19 mg/dL (H)). ?Liver Function  Tests: ?No results for input(s): AST, ALT, ALKPHOS, BILITOT, PROT, ALBUMIN in the last 168 hours. ?No results for input(s): LIPASE, AMYLASE in the last 168 hours. ?No results for input(s): AMMONIA in the last 168 hours. ?Coagulation Profile: ?No results for input(s): INR, PROTIME in the last 168 hours. ?Cardiac Enzymes: ?No results for input(s): CKTOTAL, CKMB, CKMBINDEX, TROPONINI in the last 168 hours. ?BNP (last 3 results) ?No results for input(s): PROBNP in the last 8760 hours. ?HbA1C: ?No results for input(s): HGBA1C in the last 72 hours. ?CBG: ?No results for input(s): GLUCAP in the last 168 hours. ?Lipid Profile: ?No results for input(s): CHOL, HDL, LDLCALC, TRIG, CHOLHDL, LDLDIRECT in the last 72 hours. ?Thyroid Function Tests: ?No results for input(s): TSH, T4TOTAL, FREET4, T3FREE, THYROIDAB in the last 72 hours. ?Anemia Panel: ?No results for input(s): VITAMINB12, FOLATE, FERRITIN, TIBC, IRON, RETICCTPCT in the last 72 hours. ?Urine analysis: ?   ?Component Value Date/Time  ? [place] YELLOW 05/25/2021 1911  ? APPEARANCEUR HAZY (A) 05/25/2021 1911  ? LABSPEC 1.008 05/25/2021 1911  ? PHURINE 5.0 05/25/2021 1911  ? [place] NEGATIVE 05/25/2021 1911  ? HGBUR SMALL (A) 05/25/2021 1911  ? [place] NEGATIVE 05/25/2021 1911  ? [place] Stain NEGATIVE 05/25/2021 1911  ? [place] NEGATIVE 05/25/2021 1911  ? NITRITE NEGATIVE 05/25/2021 1911  ? LEUKOCYTESUR SMALL (A) 05/25/2021 1911  ? ?Sepsis Labs: ?@[email or web address] (procalcitonin:4,lacticidven:4) ? ?) ?Recent Results (from the past 240 hour(s))  ?Culture, blood (routine x 2)     Status: None (Preliminary result)  ? Collection Time: 05/25/21  1:42 PM  ? Specimen: BLOOD  ?Result Value Ref Range Status  ? Specimen Description BLOOD BLOOD LEFT ARM  Final  ? Special Requests   Final  ?  BOTTLES DRAWN AEROBIC AND ANAEROBIC Blood Culture adequate volume  ? Culture   Final  ?  NO GROWTH 4 DAYS ?Performed at [facility], [place] [address], [place], [place] [address] ?  ?  Report Status PENDING  Incomplete  ?Culture, blood (routine x 2)     Status: None (Preliminary result)  ? Collection Time: 05/25/21  1:47 PM  ? Specimen: BLOOD  ?Result Value Ref Range Status  ? Specimen Description BLOOD BLOOD LEFT HAND  Final  ? Special Requests   Final  ?  BOTTLES DRAWN AEROBIC AND ANAEROBIC Blood Culture adequate volume  ? Culture   Final  ?  NO GROWTH 4 DAYS ?Performed at [facility], [place] [address], [place], [place] [address] ?  ? Report Status PENDING  Incomplete  ?  ? ?Radiology Studies: ?No results found. ? ? ?Scheduled Meds: ? amiodarone  400 mg Oral BID  ? apixaban  5 mg Oral BID  ? atorvastatin  10 mg Oral Daily  ? Chlorhexidine Gluconate Cloth  6 each Topical Daily  ? docusate sodium  100 mg Oral Daily  ? leptospermum manuka honey  1 application. Topical Daily  ? magnesium oxide  400 mg Oral Daily  ? mexiletine  300 mg Oral Q12H  ? midodrine  10 mg Oral  TID WC  ? sodium chloride flush  3 mL Intravenous Q12H  ? sodium chloride flush  3 mL Intravenous Q12H  ? torsemide  40 mg Oral Daily  ? ?Continuous Infusions: ? sodium chloride    ? milrinone 0.125 mcg/kg/min (05/29/21 0050)  ? ? ? LOS: 15 days  ? ? ?Time spent: 77min ? ?Domenic Polite, MD ?[facility] ? ? ?05/29/2021, 1:24 PM  ?  ?

## 2021-05-29 NOTE — Progress Notes (Signed)
? ? Advanced Heart Failure Rounding Note ? ?PCP-Cardiologist: Larae Grooms, MD  ? ?Subjective:   ? ?04/05: RHC Low filling pressures with normal CO.  ?04/06: Seen by structural team for consideration of mitral TEER ?04/12: Started on milrinone 0.125, lactic acid 2.8>>1.7>1.6 ? ?Co-ox 79% this am on 0.125 milrinone. On torsemide 40 daily. Scr 2.17 -> 2.35 -> 2.19. Weight down 4 pounds  ? ?Denies CP or SOB. No orthopnea or PND. Says he is having GERD. ? ?Still with frequent PVCs.  ? ? ?TEE:  Mild restriction of P2. Severe central/posterior MR due to P2 restriction and annular dilation. EF 15-20%, LV severely dilated w/ severe HK. RV dilated + severe HK. Mod TR  ? ?R/LHCAssessment: ?1. Mild non-obstructive CAD ?2. NICM EF < 20% ?3. Low filling pressures with normal cardiac output ?4. No significant v-waves in PCWP tracing despite presence of severe MR on TEE (may be influenced by low BP and filling pressures) ? ?Ao = 84/60 (70) ?LV = 83/6 ?RA = 1 ?RV = 20/5 ?PA = 21/7 (14) ?PCW = 3 (no prominent v waves) ?Fick cardiac output/index = 6.1/3.0 ?PVR = 1.8 WU ?Ao sat = 96% ?PA sat = 71%, 74% ? ? ?cMRI 05/20/21 ? ?IMPRESSION: ?1. Limited study as patient refused to continue shortly after ?contrast administration. Single shot LGE images and post contrast T1 ?maps were obtained, but high resolution LGE images and flow ?quantification to assess mitral/tricuspid regurgitation were not ?done ?  ?2. Unable to quantify volumes due to significant motion artifact on ?cine images likely from frequent PVCs, but visually LV is severely ?dilated with severe systolic dysfunction ?  ?3. Visually RV is severely dilated with moderate systolic ?dysfunction ?  ?4. Basal septal midwall late gadolinium enhancement, which is a scar ?pattern seen in nonischemic cardiomyopathies and associated with ?worse prognosis ?  ?5. RV insertion site LGE, which is a nonspecific finding often seen ?in setting of elevated pulmonary pressures ?  ?6.   Ascending aortic aneurysm measuring 3mm ? ?Objective:   ?Weight Range: ?84.8 kg ?Body mass index is 21.06 kg/m?.  ? ?Vital Signs:   ?Temp:  [97.4 ?F (36.3 ?C)-98.3 ?F (36.8 ?C)] 97.5 ?F (36.4 ?C) (04/16 [id number]) ?Pulse Rate:  [63-87] 87 (04/16 0813) ?Resp:  [15-20] 15 (04/16 0813) ?BP: (97-110)/(62-74) 110/71 (04/16 0813) ?SpO2:  [96 %-100 %] 96 % (04/16 0813) ?Weight:  [84.8 kg] 84.8 kg (04/16 0615) ?Last BM Date : 05/27/21 ? ?Weight change: ?Filed Weights  ? 05/27/21 0624 05/28/21 0418 05/29/21 0615  ?Weight: 86.4 kg 86.4 kg 84.8 kg  ? ? ?Intake/Output:  ? ?Intake/Output Summary (Last 24 hours) at 05/29/2021 1207 ?Last data filed at 05/29/2021 1015 ?Gross per 24 hour  ?Intake 250 ml  ?Output 700 ml  ?Net -450 ml  ? ? ?  ? ? ?Physical Exam  ? ?General:  Elderly sitting in bed No resp difficulty ?HEENT: normal ?Neck: supple. no JVD. Carotids 2+ bilat; no bruits. No lymphadenopathy or thryomegaly appreciated. ?Cor: PMI nondisplaced. Regular rate & rhythm. 2/6 MR ?Lungs: clear ?Abdomen: obese soft, nontender, nondistended. No hepatosplenomegaly. No bruits or masses. Good bowel sounds. ?Extremities: no cyanosis, clubbing, rash, edema ?Neuro: alert & orientedx3, cranial nerves grossly intact. moves all 4 extremities w/o difficulty. Affect pleasant ? ?Telemetry  ? ?SR 80-90s  Frequent PVCs. Personally reviewed ? ?Labs  ?  ?CBC ?No results for input(s): WBC, NEUTROABS, HGB, HCT, MCV, PLT in the last 72 hours. ? ? ?Basic Metabolic Panel ?Recent Labs  ?  05/28/21 ?0435 05/29/21 ?0725  ?NA 133* 135  ?K 4.5 4.5  ?CL 100 100  ?CO2 27 27  ?GLUCOSE 88 78  ?BUN 38* 37*  ?CREATININE 2.35* 2.19*  ?CALCIUM 9.0 8.9  ?MG 2.1 1.8  ? ? ?Liver Function Tests ?No results for input(s): AST, ALT, ALKPHOS, BILITOT, PROT, ALBUMIN in the last 72 hours. ?No results for input(s): LIPASE, AMYLASE in the last 72 hours. ?Cardiac Enzymes ?No results for input(s): CKTOTAL, CKMB, CKMBINDEX, TROPONINI in the last 72 hours. ? ?BNP: ?BNP (last 3  results) ?Recent Labs  ?  05/09/21 ?[id number] 05/10/21 ?0205 05/13/21 ?2142  ?BNP >4,500.0* >4,500.0* >4,500.0*  ? ? ? ?ProBNP (last 3 results) ?No results for input(s): PROBNP in the last 8760 hours. ? ? ?D-Dimer ?No results for input(s): DDIMER in the last 72 hours. ?Hemoglobin A1C ?No results for input(s): HGBA1C in the last 72 hours. ?Fasting Lipid Panel ?No results for input(s): CHOL, HDL, LDLCALC, TRIG, CHOLHDL, LDLDIRECT in the last 72 hours. ?Thyroid Function Tests ?No results for input(s): TSH, T4TOTAL, T3FREE, THYROIDAB in the last 72 hours. ? ?Invalid input(s): FREET3 ? ?Other results: ? ? ?Imaging  ? ? ?No results found. ? ? ?Medications:   ? ? ?Scheduled Medications: ? amiodarone  400 mg Oral BID  ? apixaban  5 mg Oral BID  ? atorvastatin  10 mg Oral Daily  ? Chlorhexidine Gluconate Cloth  6 each Topical Daily  ? docusate sodium  100 mg Oral Daily  ? leptospermum manuka honey  1 application. Topical Daily  ? magnesium oxide  400 mg Oral Daily  ? mexiletine  300 mg Oral Q12H  ? midodrine  10 mg Oral TID WC  ? sodium chloride flush  3 mL Intravenous Q12H  ? sodium chloride flush  3 mL Intravenous Q12H  ? torsemide  40 mg Oral Daily  ? ? ?Infusions: ? sodium chloride    ? milrinone 0.125 mcg/kg/min (05/29/21 0050)  ? ? ?PRN Medications: ?sodium chloride, acetaminophen, ondansetron (ZOFRAN) IV, sodium chloride flush, sodium chloride flush ? ? ? ?Patient Profile  ? ?81 y.o. male with history of chronic biventricular HF, NICM, CAD, severe MR/TR, HTN, obesity and multiple recent admissions, now admitted w/ acute PE and a/c CHF.  ? ?Echo this admission, EF 25%, mildly dilated RV with moderately decreased systolic function, severe MR and TR. IVC dilated.  ? ?Assessment/Plan  ? ?1. Acute on chronic Biventricular HF   ?- ECHO 04/2020 EF 25-30% with mild MR.   ?- Echo 04/04/21 EF LVEF 20-25%, RV moderately reduced, LA/RA severely dilated, and severe MR.  ?- Echo per Dr. 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 ?- cMRI c/w severe NICM w/ basal septal midwall LGE  ?- Elevated lactic acid 2.8 on 04/12. Now on milrinone 0.125 with improvement in lactic acidosis. Co-ox 79%. Will stop milrinone and see how he does.  ?- CVP ok Continue Torsemide 40 mg daily ?- GDMT limited d/t hypotension and AKI. Continue midodrine 10 mg TID for help w/ renal perfusion  ?- Off Spiro w/ elevated K and SCr  ?- Still with frequent PVCs. Discussed again with EP and they wil consider possible PVC ablation this week if  he is stabilized. Even if he does have PVC CM will take some time to reverse once PVCs are gone. May or may not be able to keep him stable off milrinone. We will see.  ? ?2. Lactic acidosis ?- Suspect d/t low-output HF ?- Resolved with milrinone ?- BCx NGTD procalcitonin < 0.10 X 2 ?- No leukocytosis, AF, CXR 04/12 with no acute process ?- Stopping milrinone today. Follow closely ? ?3. CAD ?- LHC w/ mild non-obstructive CAD ?- No s/s angina ?- Continue statin ?  ?4. Mitral Regurgitation ?- Severe on echo this admit and last echo 02/23 ?- TEE showed mild restriction of P2. Severe central/posterior MR due to P2 restriction and annular dilation ?- functional, severely dilated LA and LV  ?- Seen by structural team. Considering mitral TEER. Next procedure date 04/20.  ?  ?5. Tricuspid Regurgitation  ?- moderate on TEE  ?- functional, severely dilated RA and RV   ?- HF optimization per above  ?  ?6. Acute PE ?- Continue Eliquis. ?- No evidence of DVT on [place] ?- ? 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  ?7. Hyperlipidemia ?- Continue statin ?  ?8. AKI on CKD IIIa ?- Scr  baseline  1.4-1.7 last few months, this admit 1.4>>1.76>2.02>>>2.30>2.17 > 2.35> 2.19 Suspect cardiorenal syndrome. ?- Continue milrinone  ?- follow BMP  ?  ?9. NSVT/PVCs ?- suspect contributing to CM , need to suppress. ?- Still with ver

## 2021-05-30 DIAGNOSIS — N179 Acute kidney failure, unspecified: Secondary | ICD-10-CM | POA: Diagnosis not present

## 2021-05-30 DIAGNOSIS — I5023 Acute on chronic systolic (congestive) heart failure: Secondary | ICD-10-CM | POA: Diagnosis not present

## 2021-05-30 DIAGNOSIS — I2699 Other pulmonary embolism without acute cor pulmonale: Secondary | ICD-10-CM | POA: Diagnosis not present

## 2021-05-30 DIAGNOSIS — N189 Chronic kidney disease, unspecified: Secondary | ICD-10-CM | POA: Diagnosis not present

## 2021-05-30 LAB — BASIC METABOLIC PANEL
Anion gap: 8 (ref 5–15)
BUN: 43 mg/dL — ABNORMAL HIGH (ref 8–23)
CO2: 26 mmol/L (ref 22–32)
Calcium: 9.2 mg/dL (ref 8.9–10.3)
Chloride: 98 mmol/L (ref 98–111)
Creatinine, Ser: 2.11 mg/dL — ABNORMAL HIGH (ref 0.61–1.24)
GFR, Estimated: 31 mL/min — ABNORMAL LOW (ref >60)
Glucose, Bld: 83 mg/dL (ref 70–99)
Potassium: 4.9 mmol/L (ref 3.5–5.1)
Sodium: 132 mmol/L — ABNORMAL LOW (ref 135–145)

## 2021-05-30 LAB — CBC
HCT: 37.8 % — ABNORMAL LOW (ref 39.0–52.0)
Hemoglobin: 12.3 g/dL — ABNORMAL LOW (ref 13.0–17.0)
MCH: 25.6 pg — ABNORMAL LOW (ref 26.0–34.0)
MCHC: 32.5 g/dL (ref 30.0–36.0)
MCV: 78.8 fL — ABNORMAL LOW (ref 80.0–100.0)
Platelets: 197 10*3/uL (ref 150–400)
RBC: 4.8 MIL/uL (ref 4.22–5.81)
RDW: 20.7 % — ABNORMAL HIGH (ref 11.5–15.5)
WBC: 5.7 10*3/uL (ref 4.0–10.5)
nRBC: 0 % (ref 0.0–0.2)

## 2021-05-30 LAB — CULTURE, BLOOD (ROUTINE X 2)
Culture: NO GROWTH
Culture: NO GROWTH
Special Requests: ADEQUATE
Special Requests: ADEQUATE

## 2021-05-30 LAB — MAGNESIUM: Magnesium: 1.9 mg/dL (ref 1.7–2.4)

## 2021-05-30 LAB — COOXEMETRY PANEL
Carboxyhemoglobin: 1 % (ref 0.5–1.5)
Methemoglobin: <0.7 % (ref 0.0–1.5)
O2 Saturation: 66.7 %
Total hemoglobin: 12 g/dL (ref 12.0–16.0)

## 2021-05-30 MED ORDER — SORBITOL 70 % SOLN
30.0000 mL | Freq: Once | Status: AC
  Administered 2021-05-30: 30 mL via ORAL
  Filled 2021-05-30 (×2): qty 30

## 2021-05-30 MED ORDER — POLYETHYLENE GLYCOL 3350 17 G PO PACK
17.0000 g | PACK | Freq: Two times a day (BID) | ORAL | Status: DC
  Administered 2021-05-30 – 2021-06-01 (×5): 17 g via ORAL
  Filled 2021-05-30 (×5): qty 1

## 2021-05-30 MED ORDER — TORSEMIDE 20 MG PO TABS: 40.0000 mg | ORAL_TABLET | Freq: Every day | ORAL | Status: DC

## 2021-05-30 MED ORDER — SENNOSIDES-DOCUSATE SODIUM 8.6-50 MG PO TABS
1.0000 | ORAL_TABLET | Freq: Once | ORAL | Status: AC
Start: 2021-05-30 — End: 2021-05-30
  Administered 2021-05-30: 1 via ORAL
  Filled 2021-05-30: qty 1

## 2021-05-30 NOTE — Progress Notes (Addendum)
? ? Advanced Heart Failure Rounding Note ? ?PCP-Cardiologist: Larae Grooms, MD  ? ?Subjective:   ? ?04/05: RHC Low filling pressures with normal CO.  ?04/06: Seen by structural team for consideration of mitral TEER ?04/12: Started on milrinone 0.125, lactic acid 2.8>>1.7>1.6 ?04/16: Milrinone discontinued  ? ?Co-ox stable off milrinone, 67%. CVP ~4. Reports not eating much.  ? ?1.5L in UOP yesterday w/ torsemide. Wt down 4 lb.   ? ?SCr trending down, 2.35>>2.19>>2.11. BUN 37>>43  ? ?C/w frequent PVCs, ~30/min  ? ?Feels tired. Complains of constipation. No dyspnea. No chest pain.  ? ? ?TEE:  Mild restriction of P2. Severe central/posterior MR due to P2 restriction and annular dilation. EF 15-20%, LV severely dilated w/ severe HK. RV dilated + severe HK. Mod TR  ? ?R/LHCAssessment: ?1. Mild non-obstructive CAD ?2. NICM EF < 20% ?3. Low filling pressures with normal cardiac output ?4. No significant v-waves in PCWP tracing despite presence of severe MR on TEE (may be influenced by low BP and filling pressures) ? ?Ao = 84/60 (70) ?LV = 83/6 ?RA = 1 ?RV = 20/5 ?PA = 21/7 (14) ?PCW = 3 (no prominent v waves) ?Fick cardiac output/index = 6.1/3.0 ?PVR = 1.8 WU ?Ao sat = 96% ?PA sat = 71%, 74% ? ? ?cMRI 05/20/21 ? ?IMPRESSION: ?1. Limited study as patient refused to continue shortly after ?contrast administration. Single shot LGE images and post contrast T1 ?maps were obtained, but high resolution LGE images and flow ?quantification to assess mitral/tricuspid regurgitation were not ?done ?  ?2. Unable to quantify volumes due to significant motion artifact on ?cine images likely from frequent PVCs, but visually LV is severely ?dilated with severe systolic dysfunction ?  ?3. Visually RV is severely dilated with moderate systolic ?dysfunction ?  ?4. Basal septal midwall late gadolinium enhancement, which is a scar ?pattern seen in nonischemic cardiomyopathies and associated with ?worse prognosis ?  ?5. RV insertion site  LGE, which is a nonspecific finding often seen ?in setting of elevated pulmonary pressures ?  ?6.  Ascending aortic aneurysm measuring 62mm ? ?Objective:   ?Weight Range: ?84.8 kg ?Body mass index is 21.06 kg/m?.  ? ?Vital Signs:   ?Temp:  [98 ?F (36.7 ?C)-98.5 ?F (36.9 ?C)] 98 ?F (36.7 ?C) (04/17 [id number]) ?Pulse Rate:  [72-91] 73 (04/17 0728) ?Resp:  [15-19] 18 (04/17 0728) ?BP: (95-116)/(65-85) 100/65 (04/17 0728) ?SpO2:  [93 %-98 %] 93 % (04/17 0728) ?Last BM Date : 05/28/21 ? ?Weight change: ?Filed Weights  ? 05/27/21 0624 05/28/21 0418 05/29/21 0615  ?Weight: 86.4 kg 86.4 kg 84.8 kg  ? ? ?Intake/Output:  ? ?Intake/Output Summary (Last 24 hours) at 05/30/2021 0749 ?Last data filed at 05/30/2021 0014 ?Gross per 24 hour  ?Intake 1090.36 ml  ?Output 1530 ml  ?Net -439.64 ml  ? ?  ? ? ?Physical Exam  ? ?CVP 3-4  ?General:  elderly male. No respiratory difficulty ?HEENT: normal ?Neck: supple. no JVD. Carotids 2+ bilat; no bruits. No lymphadenopathy or thyromegaly appreciated. ?Cor: PMI nondisplaced. Irregularly irregular 2/6 MR ?Lungs: clear ?Abdomen: soft, nontender, nondistended. No hepatosplenomegaly. No bruits or masses. Good bowel sounds. ?Extremities: no cyanosis, clubbing, rash, trace pretibial edema + RUE PICC  ?Neuro: alert & oriented x 3, cranial nerves grossly intact. moves all 4 extremities w/o difficulty. Affect pleasant. ? ? ?Telemetry  ? ?SR 80-90s  Frequent PVCs ~30/min. Personally reviewed ? ?Labs  ?  ?CBC ?Recent Labs  ?  05/30/21 ?0505  ?WBC 5.7  ?  HGB 12.3*  ?HCT 37.8*  ?MCV 78.8*  ?PLT 197  ? ? ?Basic Metabolic Panel ?Recent Labs  ?  05/29/21 ?0725 05/30/21 ?0505  ?NA 135 132*  ?K 4.5 4.9  ?CL 100 98  ?CO2 27 26  ?GLUCOSE 78 83  ?BUN 37* 43*  ?CREATININE 2.19* 2.11*  ?CALCIUM 8.9 9.2  ?MG 1.8 1.9  ? ?Liver Function Tests ?No results for input(s): AST, ALT, ALKPHOS, BILITOT, PROT, ALBUMIN in the last 72 hours. ?No results for input(s): LIPASE, AMYLASE in the last 72 hours. ?Cardiac Enzymes ?No results  for input(s): CKTOTAL, CKMB, CKMBINDEX, TROPONINI in the last 72 hours. ? ?BNP: ?BNP (last 3 results) ?Recent Labs  ?  05/09/21 ?[id number] 05/10/21 ?0205 05/13/21 ?2142  ?BNP >4,500.0* >4,500.0* >4,500.0*  ? ? ?ProBNP (last 3 results) ?No results for input(s): PROBNP in the last 8760 hours. ? ? ?D-Dimer ?No results for input(s): DDIMER in the last 72 hours. ?Hemoglobin A1C ?No results for input(s): HGBA1C in the last 72 hours. ?Fasting Lipid Panel ?No results for input(s): CHOL, HDL, LDLCALC, TRIG, CHOLHDL, LDLDIRECT in the last 72 hours. ?Thyroid Function Tests ?No results for input(s): TSH, T4TOTAL, T3FREE, THYROIDAB in the last 72 hours. ? ?Invalid input(s): FREET3 ? ?Other results: ? ? ?Imaging  ? ? ?No results found. ? ? ?Medications:   ? ? ?Scheduled Medications: ? amiodarone  400 mg Oral BID  ? apixaban  5 mg Oral BID  ? atorvastatin  10 mg Oral Daily  ? Chlorhexidine Gluconate Cloth  6 each Topical Daily  ? docusate sodium  100 mg Oral Daily  ? leptospermum manuka honey  1 application. Topical Daily  ? magnesium oxide  400 mg Oral Daily  ? mexiletine  300 mg Oral Q12H  ? midodrine  10 mg Oral TID WC  ? pantoprazole  40 mg Oral Daily  ? polyethylene glycol  17 g Oral Daily  ? sodium chloride flush  3 mL Intravenous Q12H  ? sodium chloride flush  3 mL Intravenous Q12H  ? torsemide  40 mg Oral Daily  ? ? ?Infusions: ? sodium chloride    ? ? ?PRN Medications: ?sodium chloride, acetaminophen, ondansetron (ZOFRAN) IV, sodium chloride flush, sodium chloride flush ? ? ? ?Patient Profile  ? ?81 y.o. male with history of chronic biventricular HF, NICM, CAD, severe MR/TR, HTN, obesity and multiple recent admissions, now admitted w/ acute PE and a/c CHF.  ? ?Echo this admission, EF 25%, mildly dilated RV with moderately decreased systolic function, severe MR and TR. IVC dilated.  ? ?Assessment/Plan  ? ?1. Acute on chronic Biventricular HF   ?- ECHO 04/2020 EF 25-30% with mild MR.   ?- Echo 04/04/21 EF LVEF 20-25%, RV  moderately reduced, LA/RA severely dilated, and severe MR.  ?- Echo per Dr. 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 ?- cMRI c/w severe NICM w/ basal septal midwall LGE  ?- Elevated lactic acid 2.8 on 04/12. Started on milrinone 0.125 with improvement in lactic acidosis. Milrinone discontinued 4/16. Co-ox 67% today  ?- CVP 3-4. Hold torsemide today  ?- GDMT limited d/t hypotension and AKI. Continue midodrine 10 mg TID for help w/ renal perfusion  ?- Off Arlyce Harman  w/ elevated K and SCr  ?- Still with frequent PVCs. Discussed again with EP and they wil consider possible PVC ablation this week if he is stabilized. Even if he does have PVC CM will take some time to reverse once PVCs are gone. May or may not be able to keep him stable off milrinone. We will see.  ? ?2. Lactic acidosis ?- Suspect d/t low-output HF ?- Resolved with milrinone ?- BCx NGTD procalcitonin < 0.10 X 2 ?- No leukocytosis, AF, CXR 04/12 with no acute process ?- Milrinone stopped 4/16. Follow closely  ? ?3. CAD ?- LHC w/ mild non-obstructive CAD ?- No s/s angina ?- Continue statin ?  ?4. Mitral Regurgitation ?- Severe on echo this admit and last echo 02/23 ?- TEE showed mild restriction of P2. Severe central/posterior MR due to P2 restriction and annular dilation ?- functional, severely dilated LA and LV  ?- Seen by structural team. Considering mitral TEER. Next procedure date 04/20.  ?  ?5. Tricuspid Regurgitation  ?- moderate on TEE  ?- functional, severely dilated RA and RV   ?- HF optimization per above  ?  ?6. Acute PE ?- Continue Eliquis. ?- No evidence of DVT on [place] ?- ? 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  ?7. Hyperlipidemia ?- Continue statin ?  ?8. AKI on CKD IIIa ?- Scr  baseline 1.4-1.7 last few months, this admit 1.4>>1.76>2.02>>>2.30>2.17 > 2.35> 2.19>2.11 Suspect cardiorenal synd

## 2021-05-30 NOTE — Progress Notes (Signed)
?Matthew NOTE ? ? ? ?PASTOR YELL  [id number] DOB: November [name], Matthew [name], Matthew [name], Matthew [name], Matthew [name], Matthew [name], Matthew [name], Matthew [name], Matthew [name], Matthew with acute on chronic [name] [name] acute PE ?-Echo this admission noted Matthew 25%, Matthew [name], Matthew [name] TR ?-Right heart cath with low filling pressures, concern for low output state started on IV milrinone, diuresed with IV Lasix ?-Complicated by AKI ?-Also seen by structural heart team for mitral valve TEER ? ?Subjective: Sitting up in the recliner eating breakfast, denies any complaints ? ?Assessment [name] Plan: ? ?Acute bilateral pulmonary embolism  ?-Now on apixaban, stable ?-Wean O2 ? ?Acute on chronic systolic congestive heart failure ([place]) ?-Echo this admission LV systolic [name] reduced to 25%, Matthew RV dilation, RVSP 35,6 mmHg, Matthew dilatation of right [name] left atrium. Matthew mitral regurgitation [name] TR ?-Heart failure team following, right heart cath 4/5 with low filling pressures, normal cardiac output ?-Concern for low output state, started on IV milrinone 4/12 ?-Diuresed with IV Lasix, he is 18 L negative, weight down 15 EKG ?-Now switched to p.o. torsemide, continue midodrine, weaned off milrinone yesterday ?-GDMT limited by hypotension [name] AKI ?-Per heart failure team, PVC ablation being considered ?-CIR for short-term rehab at [place] ? ?Matthew MR, TR ?-TEE Matthew central [name] posterior leaflet MR, seen by structural heart team, considering mitral TEER ? ?Frequent PVCs, NSVT ?-Now on amiodarone [name] mexiletine ?-Possibly driving his cardiomyopathy, per EP may consider PVC ablation next week if stable ?-Electrolytes are okay ? ?Acute kidney injury superimposed on chronic kidney disease ([place]) ?CKD stage 3a, Hyponatremia. Hypomagnesemia. Hyperkalemia  ?-Baseline creatinine around 1.5-1.7, trended up to 2.3, likely cardiorenal,  improving ?-Remains on milrinone [name] midodrine, avoid hypotension ? ?Ascending aortic aneurysm ([place]) ?CT showing aneurysmal dilation of ascending aorta measuring 4.5 cm.  ?-Radiologist recommending semi-annual imaging followup by CTA or MRA ?-Outpatient cardiothoracic surgery follow-up ? ?DVT prophylaxis: Apixaban ?Code Status: Full code ?Family Communication: Discussed with patient in detail, Matthew family at bedside ?Disposition Plan: CIR when cleared by cardiology ? ?Consultants:  ?Heart failure ? ?Procedures:  ? ?Antimicrobials:  ? ? ?Objective: ?Vitals:  ? 05/30/21 0011 05/30/21 0453 05/30/21 0728 05/30/21 1109  ?BP: 106/76  100/65 94/73  ?Pulse: 72  73 70  ?Resp: 18  18 18   ?Temp: 98.2 ?F (36.8 ?C) 98.5 ?F (36.9 ?C) 98 ?F (36.7 ?C) 98.1 ?F (36.7 ?C)  ?TempSrc: Oral Oral Oral Oral  ?SpO2: 93%  93%   ?Weight:      ?Height:      ? ? ?Intake/Output Summary (Last 24 hours) at 05/30/2021 1451 ?Last data filed at 05/30/2021 0014 ?Gross per 24 hour  ?Intake 240 ml  ?Output 800 ml  ?Net -560 ml  ? ?Filed Weights  ? 05/27/21 0624 05/28/21 0418 05/29/21 0615  ?Weight: 86.4 kg 86.4 kg 84.8 kg  ? ? ?Examination: ? ?General exam: Pleasant elderly male sitting up in bed, AAOx3, hard of hearing, Matthew distress ?CVS: S1-S2, irregularly irregular rhythm ?Lungs: Clear bilaterally ?Abdomen: Soft, nontender, bowel sounds present ?Extremities: Trace edema, right foot wound with dressing ?Psychiatry:Mood & affect appropriate.  ? ? ? ?Data Reviewed:  ? ?CBC: ?Recent Labs  ?Lab 05/24/21 ?1112 05/25/21 ?1313 05/30/21 ?0505  ?WBC 4.0 4.0 5.7  ?HGB 11.9* 12.8* 12.3*  ?HCT 37.8* 39.5 37.8*  ?MCV 79.6* 80.6 78.8*  ?PLT 159 167 197  ? ?Basic Metabolic Panel: ?Recent Labs  ?Lab  05/26/21 ?0451 05/27/21 ?[id number] 05/28/21 ?0435 05/29/21 ?0725 05/30/21 ?0505  ?NA 131* 133* 133* 135 132*  ?K 4.7 4.3 4.5 4.5 4.9  ?CL 99 98 100 100 98  ?CO2 23 25 27 27 26   ?GLUCOSE 70 73 88 78 83  ?BUN 38* 38* 38* 37* 43*  ?CREATININE 2.30* 2.17* 2.35* 2.19* 2.11*  ?CALCIUM  8.9 9.0 9.0 8.9 9.2  ?MG 2.1 2.3 2.1 1.8 1.9  ? ?GFR: ?Estimated Creatinine Clearance: 33.5 mL/min (A) (by C-G formula based on SCr of 2.11 mg/dL (H)). ?Liver [name] Tests: ?Matthew results for input(s): AST, ALT, ALKPHOS, BILITOT, PROT, ALBUMIN in the last 168 hours. ?Matthew results for input(s): LIPASE, AMYLASE in the last 168 hours. ?Matthew results for input(s): AMMONIA in the last 168 hours. ?Coagulation Profile: ?Matthew results for input(s): INR, PROTIME in the last 168 hours. ?Cardiac Enzymes: ?Matthew results for input(s): CKTOTAL, CKMB, CKMBINDEX, TROPONINI in the last 168 hours. ?BNP (last 3 results) ?Matthew results for input(s): PROBNP in the last 8760 hours. ?HbA1C: ?Matthew results for input(s): HGBA1C in the last 72 hours. ?CBG: ?Matthew results for input(s): GLUCAP in the last 168 hours. ?Lipid Profile: ?Matthew results for input(s): CHOL, HDL, LDLCALC, TRIG, CHOLHDL, LDLDIRECT in the last 72 hours. ?Thyroid [name] Tests: ?Matthew results for input(s): TSH, T4TOTAL, FREET4, T3FREE, THYROIDAB in the last 72 hours. ?Anemia Panel: ?Matthew results for input(s): VITAMINB12, FOLATE, FERRITIN, TIBC, IRON, RETICCTPCT in the last 72 hours. ?Urine analysis: ?   ?Component Value Date/Time  ? [place] YELLOW 05/25/2021 1911  ? APPEARANCEUR HAZY (A) 05/25/2021 1911  ? LABSPEC 1.008 05/25/2021 1911  ? PHURINE 5.0 05/25/2021 1911  ? [place] NEGATIVE 05/25/2021 1911  ? HGBUR SMALL (A) 05/25/2021 1911  ? [place] NEGATIVE 05/25/2021 1911  ? [place] NEGATIVE 05/25/2021 1911  ? [place] NEGATIVE 05/25/2021 1911  ? NITRITE NEGATIVE 05/25/2021 1911  ? LEUKOCYTESUR SMALL (A) 05/25/2021 1911  ? ?Sepsis Labs: ?@[email or web address] (procalcitonin:4,lacticidven:4) ? ?) ?Recent Results (from the past 240 hour(s))  ?Culture, blood (routine x 2)     Status: None  ? Collection Time: 05/25/21  1:42 PM  ? Specimen: BLOOD  ?Result Value Ref Range Status  ? Specimen Description BLOOD BLOOD LEFT ARM  Final  ? Special Requests   Final  ?  BOTTLES DRAWN AEROBIC [name] ANAEROBIC Blood  Culture adequate volume  ? Culture   Final  ?  Matthew GROWTH 5 DAYS ?Performed at [facility], [place] [address], [place], [place] [address] ?  ? Report Status 05/30/2021 FINAL  Final  ?Culture, blood (routine x 2)     Status: None  ? Collection Time: 05/25/21  1:47 PM  ? Specimen: BLOOD  ?Result Value Ref Range Status  ? Specimen Description BLOOD BLOOD LEFT HAND  Final  ? Special Requests   Final  ?  BOTTLES DRAWN AEROBIC [name] ANAEROBIC Blood Culture adequate volume  ? Culture   Final  ?  Matthew GROWTH 5 DAYS ?Performed at [facility], [place] [address], [place], [place] [address] ?  ? Report Status 05/30/2021 FINAL  Final  ?  ? ?Radiology Studies: ?Matthew results found. ? ? ?Scheduled Meds: ? amiodarone  400 mg Oral BID  ? apixaban  5 mg Oral BID  ? atorvastatin  10 mg Oral Daily  ? Chlorhexidine Gluconate Cloth  6 each Topical Daily  ? docusate sodium  100 mg Oral Daily  ? leptospermum manuka honey  1 application. Topical Daily  ? magnesium oxide  400 mg Oral  Daily  ? mexiletine  300 mg Oral Q12H  ? midodrine  10 mg Oral TID WC  ? pantoprazole  40 mg Oral Daily  ? polyethylene glycol  17 g Oral BID  ? sodium chloride flush  3 mL Intravenous Q12H  ? sodium chloride flush  3 mL Intravenous Q12H  ? [START ON 05/31/2021] torsemide  40 mg Oral Daily  ? ?Continuous Infusions: ? sodium chloride    ? ? ? LOS: 16 days  ? ? ?Time spent: 3min ? ?Domenic Polite, MD ?[facility] ? ? ?05/30/2021, 2:51 PM  ?  ?

## 2021-05-30 NOTE — Progress Notes (Signed)
Physical Therapy Treatment ?Patient Details ?Name: Matthew [name] ?MRN: [id number] ?DOB: [birth year] ?Today's Date: 05/30/2021 ? ? ?History of Present Illness 81 y.o. male adm 3/31 with SOB with RLL PE treated with Heparin> Eliquis. Admission 3/25-28/2023 with AMS and UTI. L radial heart cath completed on 4/5. PMHx: CAD, NICM, CHF, HTN, OA, obesity ? ?  ?PT Comments  ? ? The pt was agreeable to limited session this morning with progression of gait training and endurance. He continues to need minA and increased time to achieve partial stand as he maintains knee, hip, and trunk flexion in stance (likely the pt's baseline), and minA to complete short bout of ambulation with chair follow due to poor endurance and need for seated rest to recover. Pt then reports too fatigued to continue, asking to eat breakfast and rest rather than continue with mobility attempts at this time. The pt will continue to benefit from skilled PT to progress endurance, strength, and stability, continue to recommend SNF rehab at d/c to maximize recovery and independence.  ?   ?Recommendations for follow up therapy are one component of a multi-disciplinary discharge planning process, led by the attending physician.  Recommendations may be updated based on patient status, additional functional criteria and insurance authorization. ? ?Follow Up Recommendations ? Acute inpatient rehab (3hours/day) ?  ?  ?Assistance Recommended at Discharge Frequent or constant Supervision/Assistance  ?Patient can return home with the following A little help with bathing/dressing/bathroom;Assistance with cooking/housework;Assist for transportation;Help with stairs or ramp for entrance;A lot of help with walking and/or transfers ?  ?Equipment Recommendations ? None recommended by PT  ?  ?Recommendations for Other Services   ? ? ?  ?Precautions / Restrictions Precautions ?Precautions: Other (comment);Fall ?Precaution Comments: L radial heart cath on  4/5 ?Restrictions ?Weight Bearing Restrictions: No  ?  ? ?Mobility ? Bed Mobility ?Overal bed mobility: Needs Assistance ?  ?  ?  ?  ?  ?  ?General bed mobility comments: pt sitting EOB upon arrival ?  ? ?Transfers ?Overall transfer level: Needs assistance ?Equipment used: Rolling Silveira (2 wheels) ?Transfers: Sit to/from Stand ?Sit to Stand: Min assist, +2 physical assistance, +2 safety/equipment, From elevated surface ?  ?  ?  ?  ?  ?General transfer comment: minA to power up from elevated EOB, pt with good use of UE, slow to rise with minimal extension at knees and hips. max cues for posure and hip extension but pt remains with flexed trunk and knees ?  ? ?Ambulation/Gait ?Ambulation/Gait assistance: Min assist, +2 safety/equipment ?Gait Distance (Feet): 20 Feet ?Assistive device: Rolling Katzenstein (2 wheels) ?Gait Pattern/deviations: Trunk flexed, Step-through pattern, Decreased stride length, Knee flexed in stance - right, Knee flexed in stance - left, Knees buckling, Decreased dorsiflexion - left ?Gait velocity: reduced ?Gait velocity interpretation: <1.31 ft/sec, indicative of household ambulator ?  ?General Gait Details: slow, very flexed posutre despite cues. shuffling steps without heel-toe pattern, fatigues quickly and progressively flexes trunk. minA to steady, +2 for chair follow ? ? ? ?  ?Balance Overall balance assessment: Needs assistance ?Sitting-balance support: Feet supported ?Sitting balance-[name] Scale: Good ?  ?  ?Standing balance support: Bilateral upper extremity supported, During functional activity ?Standing balance-[name] Scale: Poor ?Standing balance comment: poor posture but pt states is baseline, dependent on BUE support ?  ?  ?  ?  ?  ?  ?  ?  ?  ?  ?  ?  ? ?  ?Cognition Arousal/Alertness: Awake/alert ?Behavior During Therapy: Flat  affect ?Overall Cognitive Status: Impaired/Different from baseline ?Area of Impairment: Attention, Following commands, Safety/judgement, Problem solving,  Memory ?  ?  ?  ?  ?  ?  ?  ?  ?  ?Current Attention Level: Focused ?Memory: Decreased recall of precautions, Decreased short-term memory ?Following Commands: Follows one step commands inconsistently, Follows one step commands with increased time ?Safety/Judgement: Decreased awareness of safety, Decreased awareness of deficits ?  ?Problem Solving: Slow processing, Decreased initiation, Difficulty sequencing, Requires verbal cues ?General Comments: increased time to follow all commands, poor attention to cues in distracting environment, asking multiple times about plan of session, difficult to discern at times cog vs vision vs hearing deficits. ?  ?  ? ?  ?Exercises   ? ?  ?General Comments General comments (skin integrity, edema, etc.): VSS on RA, few PVCs through session ?  ?  ? ?Pertinent Vitals/Pain Pain Assessment ?Pain Assessment: No/denies pain ?Pain Intervention(s): Monitored during session  ? ? ? ?PT Goals (current goals can now be found in the care plan section) Acute Rehab PT Goals ?Patient Stated Goal: to get better ?PT Goal Formulation: With patient ?Time For Goal Achievement: 06/13/21 ?Potential to Achieve Goals: Fair ?Progress towards PT goals: Progressing toward goals ? ?  ?Frequency ? ? ? Min 3X/week ? ? ? ?  ?PT Plan Current plan remains appropriate  ? ? ?   ?AM-PAC PT "6 Clicks" Mobility   ?Outcome Measure ? Help needed turning from your back to your side while in a flat bed without using bedrails?: A Little ?Help needed moving from lying on your back to sitting on the side of a flat bed without using bedrails?: A Little ?Help needed moving to and from a bed to a chair (including a wheelchair)?: A Little ?Help needed standing up from a chair using your arms (e.g., wheelchair or bedside chair)?: A Little ?Help needed to walk in hospital room?: A Little ?Help needed climbing 3-5 steps with a railing? : A Lot ?6 Click Score: 17 ? ?  ?End of Session Equipment Utilized During Treatment: Gait  belt ?Activity Tolerance: Patient tolerated treatment well;Patient limited by fatigue ?Patient left: with call bell/phone within reach;in chair;with chair alarm set ?Nurse Communication: Mobility status ?PT Visit Diagnosis: Muscle weakness (generalized) (M62.81);Unsteadiness on feet (R26.81);Other abnormalities of gait and mobility (R26.89);Difficulty in walking, not elsewhere classified (R26.2) ?  ? ? ?Time: 6503-5465 ?PT Time Calculation (min) (ACUTE ONLY): 20 min ? ?Charges:  $Therapeutic Exercise: 8-22 mins          ?          ? ?Vickki Muff, PT, DPT  ? ?Acute Rehabilitation Department ?Pager #: [phone] - [phone] ? ? ?Ronnie Derby ?05/30/2021, 8:42 AM ? ?

## 2021-05-30 NOTE — Progress Notes (Signed)
Inpatient Rehab Admissions Coordinator:  ? ?Pt. Is not yet medically ready for CIR, will follow for potential admit once medically stable. ? ?Megan Salon, MS, CCC-SLP ?Rehab Admissions Coordinator  ?[phone] (celll) ?[phone] (office) ? ?

## 2021-05-30 NOTE — Progress Notes (Signed)
Occupational Therapy Treatment ?Patient Details ?Name: Matthew [name] ?MRN: [id number] ?DOB: 02-26-1940 ?Today's Date: 05/30/2021 ? ? ?History of present illness 81 y.o. male adm 3/31 with SOB with RLL PE treated with Heparin> Eliquis. Admission 3/25-28/2023 with AMS and UTI. L radial heart cath completed on 4/5. PMHx: CAD, NICM, CHF, HTN, OA, obesity ?  ?OT comments ? Patient received in bed with son present. Patient agreeable to transfer to recliner but declined grooming tasks. Patient was mod assist with RW to stand and transfer to recliner with patient demonstrating forward flexion. Squeeze ball provided to patient to assist with grip strength and self feeding. Acute OT to continue to follow.   ? ?Recommendations for follow up therapy are one component of a multi-disciplinary discharge planning process, led by the attending physician.  Recommendations may be updated based on patient status, additional functional criteria and insurance authorization. ?   ?Follow Up Recommendations ? Acute inpatient rehab (3hours/day)  ?  ?Assistance Recommended at Discharge Frequent or constant Supervision/Assistance  ?Patient can return home with the following ? A little help with walking and/or transfers;A little help with bathing/dressing/bathroom;Assistance with cooking/housework;Direct supervision/assist for financial management;Direct supervision/assist for medications management;Assist for transportation ?  ?Equipment Recommendations ? None recommended by OT  ?  ?Recommendations for Other Services   ? ?  ?Precautions / Restrictions Precautions ?Precautions: Other (comment);Fall ?Precaution Comments: L radial heart cath on 4/5 ?Restrictions ?Weight Bearing Restrictions: No  ? ? ?  ? ?Mobility Bed Mobility ?Overal bed mobility: Needs Assistance ?Bed Mobility: Supine to Sit ?  ?  ?Supine to sit: Min guard, HOB elevated ?  ?  ?General bed mobility comments: increaed time and HOB raised to get to EOB ?  ? ?Transfers ?Overall  transfer level: Needs assistance ?Equipment used: Rolling Marciel (2 wheels) ?Transfers: Sit to/from Stand ?Sit to Stand: Mod assist ?  ?  ?Step pivot transfers: Mod assist ?  ?  ?General transfer comment: verbal cues to reach back to sit and increased time to perform with patient demonstrating forward flexion during transfer ?  ?  ?Balance Overall balance assessment: Needs assistance ?Sitting-balance support: Feet supported ?Sitting balance-[name] Scale: Good ?  ?  ?Standing balance support: Bilateral upper extremity supported, During functional activity ?Standing balance-[name] Scale: Poor ?Standing balance comment: poor posture during standing with son stating he was close to baseline ?  ?  ?  ?  ?  ?  ?  ?  ?  ?  ?  ?   ? ?ADL either performed or assessed with clinical judgement  ? ?ADL   ?  ?  ?  ?  ?  ?  ?  ?  ?  ?  ?  ?  ?  ?  ?  ?  ?  ?  ?  ?General ADL Comments: focused on transfer ?  ? ?Extremity/Trunk Assessment   ?  ?  ?  ?  ?  ? ?Vision   ?  ?  ?Perception   ?  ?Praxis   ?  ? ?Cognition Arousal/Alertness: Awake/alert ?Behavior During Therapy: Flat affect ?Overall Cognitive Status: Impaired/Different from baseline ?Area of Impairment: Attention, Following commands, Safety/judgement, Problem solving, Memory ?  ?  ?  ?  ?  ?  ?  ?  ?  ?Current Attention Level: Focused ?Memory: Decreased recall of precautions, Decreased short-term memory ?Following Commands: Follows one step commands inconsistently, Follows one step commands with increased time ?Safety/Judgement: Decreased awareness of safety, Decreased awareness of deficits ?  ?  Problem Solving: Slow processing, Decreased initiation, Difficulty sequencing, Requires verbal cues ?General Comments: required increased time to follow commands and often have commands repeated ?  ?  ?   ?Exercises Exercises: General Upper Extremity ?General Exercises - Upper Extremity ?Digit Composite Flexion: Squeeze ball, Both ? ?  ?Shoulder Instructions   ? ? ?  ?General Comments  VSS on RA, few PVCs through session  ? ? ?Pertinent Vitals/ Pain       Pain Assessment ?Pain Assessment: No/denies pain ?Pain Intervention(s): Monitored during session ? ?Home Living   ?  ?  ?  ?  ?  ?  ?  ?  ?  ?  ?  ?  ?  ?  ?  ?  ?  ?  ? ?  ?Prior Functioning/Environment    ?  ?  ?  ?   ? ?Frequency ? Min 2X/week  ? ? ? ? ?  ?Progress Toward Goals ? ?OT Goals(current goals can now be found in the care plan section) ? Progress towards OT goals: Progressing toward goals ? ?Acute Rehab OT Goals ?OT Goal Formulation: Patient unable to participate in goal setting ?Time For Goal Achievement: 06/06/21 ?Potential to Achieve Goals: Fair ?ADL Goals ?Pt Will Perform Eating: with set-up;sitting ?Pt Will Perform Grooming: with set-up;with supervision;sitting;standing ?Pt Will Perform Lower Body Bathing: with supervision;sit to/from stand ?Pt Will Perform Upper Body Dressing: with set-up;sitting ?Pt Will Perform Lower Body Dressing: with supervision;sit to/from stand ?Pt Will Transfer to Toilet: with supervision;ambulating ?Pt Will Perform Toileting - Clothing Manipulation and hygiene: with modified independence;sit to/from stand ?Additional ADL Goal #1: Pt will verbalie 3 strateiges to manage heart failure to reduce risk of readmission  ?Plan Discharge plan remains appropriate   ? ?Co-evaluation ? ? ?   ?  ?  ?  ?  ? ?  ?AM-PAC OT "6 Clicks" Daily Activity     ?Outcome Measure ? ? Help from another person eating meals?: A Little ?Help from another person taking care of personal grooming?: A Little ?Help from another person toileting, which includes using toliet, bedpan, or urinal?: A Lot ?Help from another person bathing (including washing, rinsing, drying)?: A Lot ?Help from another person to put on and taking off regular upper body clothing?: A Little ?Help from another person to put on and taking off regular lower body clothing?: A Lot ?6 Click Score: 15 ? ?  ?End of Session Equipment Utilized During Treatment: Gait  belt;Rolling [place] (2 wheels) ? ?OT Visit Diagnosis: Unsteadiness on feet (R26.81);Muscle weakness (generalized) (M62.81);Other symptoms and signs involving cognitive function ?  ?Activity Tolerance Patient tolerated treatment well ?  ?Patient Left in chair;with call bell/phone within reach;with chair alarm set;with family/visitor present ?  ?Nurse Communication Mobility status ?  ? ?   ? ?Time: 1110-1131 ?OT Time Calculation (min): 21 min ? ?Charges: OT General Charges ?$OT Visit: 1 Visit ?OT Treatments ?$Therapeutic Activity: 8-22 mins ? ?Alfonse Flavors, OTA ?Acute Rehabilitation Services  ?Pager [phone] ?Office [phone] ? ? ?Matthew [name] ?05/30/2021, 11:57 AM ?

## 2021-05-31 ENCOUNTER — Other Ambulatory Visit: Payer: Self-pay

## 2021-05-31 ENCOUNTER — Inpatient Hospital Stay (HOSPITAL_COMMUNITY): Payer: Medicare Other

## 2021-05-31 DIAGNOSIS — N189 Chronic kidney disease, unspecified: Secondary | ICD-10-CM | POA: Diagnosis not present

## 2021-05-31 DIAGNOSIS — N179 Acute kidney failure, unspecified: Secondary | ICD-10-CM | POA: Diagnosis not present

## 2021-05-31 DIAGNOSIS — I340 Nonrheumatic mitral (valve) insufficiency: Secondary | ICD-10-CM

## 2021-05-31 DIAGNOSIS — I5023 Acute on chronic systolic (congestive) heart failure: Secondary | ICD-10-CM | POA: Diagnosis not present

## 2021-05-31 DIAGNOSIS — I2699 Other pulmonary embolism without acute cor pulmonale: Secondary | ICD-10-CM | POA: Diagnosis not present

## 2021-05-31 LAB — ECHOCARDIOGRAM LIMITED
Calc EF: 31.5 %
Height: 79 in
MV M vel: 4.55 m/s
MV Peak grad: 82.8 mmHg
Radius: 0.8 cm
S' Lateral: 6.1 cm
Single Plane A2C EF: 31.2 %
Single Plane A4C EF: 32.5 %
Weight: 2958.4 oz

## 2021-05-31 LAB — BASIC METABOLIC PANEL
Anion gap: 10 (ref 5–15)
BUN: 39 mg/dL — ABNORMAL HIGH (ref 8–23)
CO2: 27 mmol/L (ref 22–32)
Calcium: 9.4 mg/dL (ref 8.9–10.3)
Chloride: 95 mmol/L — ABNORMAL LOW (ref 98–111)
Creatinine, Ser: 2.16 mg/dL — ABNORMAL HIGH (ref 0.61–1.24)
GFR, Estimated: 30 mL/min — ABNORMAL LOW (ref >60)
Glucose, Bld: 85 mg/dL (ref 70–99)
Potassium: 4.9 mmol/L (ref 3.5–5.1)
Sodium: 132 mmol/L — ABNORMAL LOW (ref 135–145)

## 2021-05-31 LAB — COOXEMETRY PANEL
Carboxyhemoglobin: 2.3 % — ABNORMAL HIGH (ref 0.5–1.5)
Carboxyhemoglobin: 1.9 % — ABNORMAL HIGH (ref 0.5–1.5)
Methemoglobin: 1.4 % (ref 0.0–1.5)
Methemoglobin: <0.7 % (ref 0.0–1.5)
O2 Saturation: 78.8 %
O2 Saturation: 96.6 %
Total hemoglobin: 13.2 g/dL (ref 12.0–16.0)
Total hemoglobin: 13.6 g/dL (ref 12.0–16.0)

## 2021-05-31 MED ORDER — SORBITOL 70 % SOLN
30.0000 mL | Freq: Once | Status: AC
  Administered 2021-05-31: 30 mL via ORAL
  Filled 2021-05-31: qty 30

## 2021-05-31 MED ORDER — SORBITOL 70 % SOLN
60.0000 mL | Freq: Once | Status: AC
  Administered 2021-05-31: 60 mL via ORAL
  Filled 2021-05-31: qty 60

## 2021-05-31 MED ORDER — TORSEMIDE 20 MG PO TABS: 40.0000 mg | ORAL_TABLET | Freq: Every day | ORAL | Status: DC

## 2021-05-31 MED ORDER — DOCUSATE SODIUM 100 MG PO CAPS
200.0000 mg | ORAL_CAPSULE | Freq: Every day | ORAL | Status: DC
  Administered 2021-05-31 – 2021-06-01 (×2): 200 mg via ORAL
  Filled 2021-05-31 (×3): qty 2

## 2021-05-31 NOTE — Progress Notes (Signed)
Inpatient Rehab Admissions Coordinator:  ? ?Pt. Is not yet medically ready for CIR. I will continue to follow once cleared by medical team to d/c to CIR.  ? ?Megan Salon, MS, CCC-SLP ?Rehab Admissions Coordinator  ?[phone] (celll) ?[phone] (office) ? ?

## 2021-05-31 NOTE — Care Management Important Message (Signed)
Important Message ? ?Patient Details  ?Name: Matthew [name] ?MRN: [id number] ?Date of Birth: [birth year] ? ? ?Medicare Important Message Given:  Yes ? ? ? ? ?Renie Ora ?05/31/2021, 8:01 AM ?

## 2021-05-31 NOTE — Progress Notes (Signed)
?PROGRESS NOTE ? ? ? ?Matthew [name]  [id number] DOB: 31-Oct-1940 DOA: 05/13/2021 ?PCP: Pcp, No  ?Narrative 80/M chronically ill with history of chronic systolic CHF, NICM, EF [id number], severe MR/TR, CAD, obesity, hypertension multiple hospitalizations with CHF, here with acute on chronic CHF and acute PE ?-Echo this admission noted EF 25%, moderately decreased systolic function, severe MR and TR ?-Right heart cath with low filling pressures, concern for low output state started on IV milrinone, diuresed with IV Lasix ?-Complicated by AKI ?-Also seen by structural heart team for mitral valve TEER ? ?Subjective: Sitting up in bed eating breakfast, denies any complaints ? ?Assessment and Plan: ? ?Acute bilateral pulmonary embolism  ?-Now on apixaban, stable ?-Wean O2 ? ?Acute on chronic systolic congestive heart failure ([place]) ?-Echo this admission LV systolic function reduced to 25%, severe RV dilation, RVSP 35,6 mmHg, severe dilatation of right and left atrium. Severe mitral regurgitation and TR ?-Heart failure team following, right heart cath 4/5 with low filling pressures, normal cardiac output ?-Concern for low output state, started on IV milrinone 4/12 ?-Diuresed with IV Lasix, he is 8 L negative, weight down 15 kg ?-Now switched to p.o. torsemide, continue midodrine, weaned off milrinone  ?-GDMT limited by hypotension and AKI ?-Per heart failure team, PVC ablation deferred ?-Plan for mitral valve TEER this week ? ?Severe MR, TR ?-TEE severe central and posterior leaflet MR, seen by structural heart team, considering mitral TEER ? ?Frequent PVCs, NSVT ?-Now on amiodarone and mexiletine ?-Possibly driving his cardiomyopathy ?-Electrolytes are okay ? ?Acute kidney injury superimposed on chronic kidney disease ([place]) ?CKD stage 3a, Hyponatremia. Hypomagnesemia. Hyperkalemia  ?-Baseline creatinine around 1.5-1.7, trended up to 2.3, likely cardiorenal, improving ?-Remains on milrinone and midodrine, avoid  hypotension ? ?Ascending aortic aneurysm ([place]) ?CT showing aneurysmal dilation of ascending aorta measuring 4.5 cm.  ?-Outpatient cardiothoracic surgery follow-up ? ?DVT prophylaxis: Apixaban ?Code Status: Full code ?Family Communication: Discussed with patient in detail, no family at bedside ?Disposition Plan: CIR when cleared by cardiology ? ?Consultants:  ?Heart failure ? ?Procedures:  ? ?Antimicrobials:  ? ? ?Objective: ?Vitals:  ? 05/31/21 0047 05/31/21 0609 05/31/21 0844 05/31/21 1228  ?BP: 103/78  112/83 102/85  ?Pulse: 73  80 82  ?Resp: 19  16 16   ?Temp: (!) 97.5 ?F (36.4 ?C)  97.6 ?F (36.4 ?C) 97.6 ?F (36.4 ?C)  ?TempSrc: Oral  Oral Oral  ?SpO2: 94%     ?Weight:  83.9 kg    ?Height:      ? ? ?Intake/Output Summary (Last 24 hours) at 05/31/2021 1418 ?Last data filed at 05/31/2021 0600 ?Gross per 24 hour  ?Intake --  ?Output 300 ml  ?Net -300 ml  ? ?Filed Weights  ? 05/28/21 0418 05/29/21 0615 05/31/21 0609  ?Weight: 86.4 kg 84.8 kg 83.9 kg  ? ? ?Examination: ? ?General exam: Pleasant elderly male sitting up in bed, AAO x3, hard of hearing, no distress ?HEENT: No JVD ?CVS: S1-S2, regular irregular rhythm, systolic murmur ?Lungs: Clear bilaterally ?Abdomen: Soft, nontender, bowel sounds present ?Extremities: Trace edema, right foot wound with dressing ?Psychiatry:Mood & affect appropriate.  ? ? ? ?Data Reviewed:  ? ?CBC: ?Recent Labs  ?Lab 05/25/21 ?1313 05/30/21 ?0505  ?WBC 4.0 5.7  ?HGB 12.8* 12.3*  ?HCT 39.5 37.8*  ?MCV 80.6 78.8*  ?PLT 167 197  ? ?Basic Metabolic Panel: ?Recent Labs  ?Lab 05/26/21 ?0451 05/27/21 ?[id number] 05/28/21 ?0435 05/29/21 ?0725 05/30/21 ?0505 05/31/21 ?[id number]  ?NA 131* 133* 133* 135 132* 132*  ?  K 4.7 4.3 4.5 4.5 4.9 4.9  ?CL 99 98 100 100 98 95*  ?CO2 23 25 27 27 26 27   ?GLUCOSE 70 73 88 78 83 85  ?BUN 38* 38* 38* 37* 43* 39*  ?CREATININE 2.30* 2.17* 2.35* 2.19* 2.11* 2.16*  ?CALCIUM 8.9 9.0 9.0 8.9 9.2 9.4  ?MG 2.1 2.3 2.1 1.8 1.9  --   ? ?GFR: ?Estimated Creatinine Clearance: 32.4 mL/min  (A) (by C-G formula based on SCr of 2.16 mg/dL (H)). ?Liver Function Tests: ?No results for input(s): AST, ALT, ALKPHOS, BILITOT, PROT, ALBUMIN in the last 168 hours. ?No results for input(s): LIPASE, AMYLASE in the last 168 hours. ?No results for input(s): AMMONIA in the last 168 hours. ?Coagulation Profile: ?No results for input(s): INR, PROTIME in the last 168 hours. ?Cardiac Enzymes: ?No results for input(s): CKTOTAL, CKMB, CKMBINDEX, TROPONINI in the last 168 hours. ?BNP (last 3 results) ?No results for input(s): PROBNP in the last 8760 hours. ?HbA1C: ?No results for input(s): HGBA1C in the last 72 hours. ?CBG: ?No results for input(s): GLUCAP in the last 168 hours. ?Lipid Profile: ?No results for input(s): CHOL, HDL, LDLCALC, TRIG, CHOLHDL, LDLDIRECT in the last 72 hours. ?Thyroid Function Tests: ?No results for input(s): TSH, T4TOTAL, FREET4, T3FREE, THYROIDAB in the last 72 hours. ?Anemia Panel: ?No results for input(s): VITAMINB12, FOLATE, FERRITIN, TIBC, IRON, RETICCTPCT in the last 72 hours. ?Urine analysis: ?   ?Component Value Date/Time  ? [place] YELLOW 05/25/2021 1911  ? APPEARANCEUR HAZY (A) 05/25/2021 1911  ? LABSPEC 1.008 05/25/2021 1911  ? PHURINE 5.0 05/25/2021 1911  ? [place] NEGATIVE 05/25/2021 1911  ? HGBUR SMALL (A) 05/25/2021 1911  ? [place] NEGATIVE 05/25/2021 1911  ? [place] NEGATIVE 05/25/2021 1911  ? [place] NEGATIVE 05/25/2021 1911  ? NITRITE NEGATIVE 05/25/2021 1911  ? LEUKOCYTESUR SMALL (A) 05/25/2021 1911  ? ?Sepsis Labs: ?@LABRCNTIP (procalcitonin:4,lacticidven:4) ? ?) ?Recent Results (from the past 240 hour(s))  ?Culture, blood (routine x 2)     Status: None  ? Collection Time: 05/25/21  1:42 PM  ? Specimen: BLOOD  ?Result Value Ref Range Status  ? Specimen Description BLOOD BLOOD LEFT ARM  Final  ? Special Requests   Final  ?  BOTTLES DRAWN AEROBIC AND ANAEROBIC Blood Culture adequate volume  ? Culture   Final  ?  NO GROWTH 5 DAYS ?Performed at [facility],  [place] [address] [place], [place] [address] ?  ? Report Status 05/30/2021 FINAL  Final  ?Culture, blood (routine x 2)     Status: None  ? Collection Time: 05/25/21  1:47 PM  ? Specimen: BLOOD  ?Result Value Ref Range Status  ? Specimen Description BLOOD BLOOD LEFT HAND  Final  ? Special Requests   Final  ?  BOTTLES DRAWN AEROBIC AND ANAEROBIC Blood Culture adequate volume  ? Culture   Final  ?  NO GROWTH 5 DAYS ?Performed at [facility], [place] [address], [place], [place] [address] ?  ? Report Status 05/30/2021 FINAL  Final  ?  ? ?Radiology Studies: ?ECHOCARDIOGRAM LIMITED ? ?Result Date: 05/31/2021 ?   ECHOCARDIOGRAM LIMITED REPORT   Patient Name:   Matthew [name] Date of Exam: 05/31/2021 Medical Rec #:  [id number]        Height:       79.0 in Accession #:    [id number]       Weight:       184.9 lb Date of Birth:  [birth year]  BSA:          2.204 m? Patient Age:    66 years         BP:           102/85 mmHg Patient Gender: M                HR:           73 bpm. Exam Location:  Inpatient Procedure: Limited Echo, 3D Echo, Cardiac Doppler and Color Doppler Indications:    Mitral valve insufficiency I34.0  History:        Patient has prior history of Echocardiogram examinations, most                 recent 05/20/2021. CHF, CAD; Risk Factors:Hypertension and                 Dyslipidemia. Chronic kidney disease. Thoracic aortic                 aneurysm.  Sonographer:    Darlina Sicilian RDCS Referring Phys: [id number] [place]  1. Left ventricular ejection fraction, by estimation, is 25 to 30%. The left ventricle has severely decreased function. The left ventricle demonstrates global hypokinesis. The left ventricular internal cavity size was severely dilated.  2. Right ventricular systolic function is moderately reduced. The right ventricular size is severely enlarged. There is mildly elevated pulmonary artery systolic pressure. The estimated right ventricular systolic pressure is [id number] mmHg.  3. Severe  mitral regurgitation 2/2 restricted PMVL (functional MR). Severe mitral valve regurgitation.  4. The aortic valve is tricuspid. Aortic valve regurgitation is mild. No aortic stenosis is present.  5. There is mild dilata

## 2021-05-31 NOTE — Progress Notes (Addendum)
? ? Advanced Heart Failure Rounding Note ? ?PCP-Cardiologist: Larae Grooms, MD  ? ?Subjective:   ? ?04/05: RHC Low filling pressures with normal CO.  ?04/06: Seen by structural team for consideration of mitral TEER ?04/12: Started on milrinone 0.125, lactic acid 2.8>>1.7>1.6 ?04/16: Milrinone discontinued  ? ?Co-ox off milrinone, 79%. ? If accurate, repeat pending. Torsemide held yesterday w/ low CVP and rising BUN in setting of poor PO intake.  ? ?CVP 6-7 today  ? ?Scr stable, 2.19>>2.11>>2.16. BUN 37>>43>>39. Urine dark. ? ?C/w frequent PVCs/ trigeminy, ~20-30/min  ? ?C/w constipation but overall feeling better today. No current resting dyspnea.  ? ? ?TEE:  Mild restriction of P2. Severe central/posterior MR due to P2 restriction and annular dilation. EF 15-20%, LV severely dilated w/ severe HK. RV dilated + severe HK. Mod TR  ? ?R/LHCAssessment: ?1. Mild non-obstructive CAD ?2. NICM EF < 20% ?3. Low filling pressures with normal cardiac output ?4. No significant v-waves in PCWP tracing despite presence of severe MR on TEE (may be influenced by low BP and filling pressures) ? ?Ao = 84/60 (70) ?LV = 83/6 ?RA = 1 ?RV = 20/5 ?PA = 21/7 (14) ?PCW = 3 (no prominent v waves) ?Fick cardiac output/index = 6.1/3.0 ?PVR = 1.8 WU ?Ao sat = 96% ?PA sat = 71%, 74% ? ? ?cMRI 05/20/21 ? ?IMPRESSION: ?1. Limited study as patient refused to continue shortly after ?contrast administration. Single shot LGE images and post contrast T1 ?maps were obtained, but high resolution LGE images and flow ?quantification to assess mitral/tricuspid regurgitation were not ?done ?  ?2. Unable to quantify volumes due to significant motion artifact on ?cine images likely from frequent PVCs, but visually LV is severely ?dilated with severe systolic dysfunction ?  ?3. Visually RV is severely dilated with moderate systolic ?dysfunction ?  ?4. Basal septal midwall late gadolinium enhancement, which is a scar ?pattern seen in nonischemic  cardiomyopathies and associated with ?worse prognosis ?  ?5. RV insertion site LGE, which is a nonspecific finding often seen ?in setting of elevated pulmonary pressures ?  ?6.  Ascending aortic aneurysm measuring 54mm ? ?Objective:   ?Weight Range: ?83.9 kg ?Body mass index is 20.83 kg/m?.  ? ?Vital Signs:   ?Temp:  [97.4 ?F (36.3 ?C)-98.1 ?F (36.7 ?C)] 97.5 ?F (36.4 ?C) (04/18 0047) ?Pulse Rate:  [70-76] 73 (04/18 0047) ?Resp:  [17-19] 19 (04/18 0047) ?BP: (94-111)/(71-78) 103/78 (04/18 0047) ?SpO2:  [94 %-100 %] 94 % (04/18 0047) ?Weight:  [83.9 kg] 83.9 kg (04/18 0609) ?Last BM Date : 05/28/21 ? ?Weight change: ?Filed Weights  ? 05/28/21 0418 05/29/21 0615 05/31/21 0609  ?Weight: 86.4 kg 84.8 kg 83.9 kg  ? ? ?Intake/Output:  ? ?Intake/Output Summary (Last 24 hours) at 05/31/2021 0751 ?Last data filed at 05/31/2021 0600 ?Gross per 24 hour  ?Intake --  ?Output 300 ml  ?Net -300 ml  ? ?  ? ? ?Physical Exam  ? ?CVP 6-7  ?General:  Well appearing, elderly . No respiratory difficulty ?HEENT: normal ?Neck: supple. JVD 7 cm. Carotids 2+ bilat; no bruits. No lymphadenopathy or thyromegaly appreciated. ?Cor: PMI nondisplaced. Irregular rhythm w/ frequent PVCs. 3/6 MR murmur ?Lungs: clear ?Abdomen: soft, nontender, nondistended. No hepatosplenomegaly. No bruits or masses. Good bowel sounds. ?Extremities: no cyanosis, clubbing, rash, edema + RUE PICC  ?Neuro: alert & oriented x 3, cranial nerves grossly intact. moves all 4 extremities w/o difficulty. Affect pleasant. ? ? ? ?Telemetry  ? ?SR 80-90s  Frequent PVCs/  ventricular trigeminy  ~20-30/min. Personally reviewed ? ?Labs  ?  ?CBC ?Recent Labs  ?  05/30/21 ?0505  ?WBC 5.7  ?HGB 12.3*  ?HCT 37.8*  ?MCV 78.8*  ?PLT 197  ? ? ?Basic Metabolic Panel ?Recent Labs  ?  05/29/21 ?0725 05/30/21 ?0505 05/31/21 ?[id number]  ?NA 135 132* 132*  ?K 4.5 4.9 4.9  ?CL 100 98 95*  ?CO2 27 26 27   ?GLUCOSE 78 83 85  ?BUN 37* 43* 39*  ?CREATININE 2.19* 2.11* 2.16*  ?CALCIUM 8.9 9.2 9.4  ?MG 1.8 1.9   --   ? ?Liver Function Tests ?No results for input(s): AST, ALT, ALKPHOS, BILITOT, PROT, ALBUMIN in the last 72 hours. ?No results for input(s): LIPASE, AMYLASE in the last 72 hours. ?Cardiac Enzymes ?No results for input(s): CKTOTAL, CKMB, CKMBINDEX, TROPONINI in the last 72 hours. ? ?BNP: ?BNP (last 3 results) ?Recent Labs  ?  05/09/21 ?[id number] 05/10/21 ?0205 05/13/21 ?2142  ?BNP >4,500.0* >4,500.0* >4,500.0*  ? ? ?ProBNP (last 3 results) ?No results for input(s): PROBNP in the last 8760 hours. ? ? ?D-Dimer ?No results for input(s): DDIMER in the last 72 hours. ?Hemoglobin A1C ?No results for input(s): HGBA1C in the last 72 hours. ?Fasting Lipid Panel ?No results for input(s): CHOL, HDL, LDLCALC, TRIG, CHOLHDL, LDLDIRECT in the last 72 hours. ?Thyroid Function Tests ?No results for input(s): TSH, T4TOTAL, T3FREE, THYROIDAB in the last 72 hours. ? ?Invalid input(s): FREET3 ? ?Other results: ? ? ?Imaging  ? ? ?No results found. ? ? ?Medications:   ? ? ?Scheduled Medications: ? amiodarone  400 mg Oral BID  ? apixaban  5 mg Oral BID  ? atorvastatin  10 mg Oral Daily  ? Chlorhexidine Gluconate Cloth  6 each Topical Daily  ? leptospermum manuka honey  1 application. Topical Daily  ? magnesium oxide  400 mg Oral Daily  ? mexiletine  300 mg Oral Q12H  ? midodrine  10 mg Oral TID WC  ? pantoprazole  40 mg Oral Daily  ? polyethylene glycol  17 g Oral BID  ? sodium chloride flush  3 mL Intravenous Q12H  ? sodium chloride flush  3 mL Intravenous Q12H  ? torsemide  40 mg Oral Daily  ? ? ?Infusions: ? sodium chloride    ? ? ?PRN Medications: ?sodium chloride, acetaminophen, ondansetron (ZOFRAN) IV, sodium chloride flush, sodium chloride flush ? ? ? ?Patient Profile  ? ?81 y.o. male with history of chronic biventricular HF, NICM, CAD, severe MR/TR, HTN, obesity and multiple recent admissions, now admitted w/ acute PE and a/c CHF.  ? ?Echo this admission, EF 25%, mildly dilated RV with moderately decreased systolic function, severe  MR and TR. IVC dilated.  ? ?Assessment/Plan  ? ?1. Acute on chronic Biventricular HF   ?- ECHO 04/2020 EF 25-30% with mild MR.   ?- Echo 04/04/21 EF LVEF 20-25%, RV moderately reduced, LA/RA severely dilated, and severe MR.  ?- Echo per Dr. 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 ?- cMRI c/w severe NICM w/ basal septal midwall LGE  ?- Elevated lactic acid 2.8 on 04/12. Started on milrinone 0.125 with improvement in lactic acidosis. Milrinone discontinued 4/16. Co-ox 78% today. Repeat pending  ?-  CVP 6. Hold torsemide again today w/ AKI  ?- GDMT limited d/t hypotension and AKI. Continue midodrine 10 mg TID for help w/ renal perfusion  ?- Off Spiro w/ elevated K and SCr  ?- Still with frequent PVCs. Discussed again with EP and they wil consider possible PVC ablation this week if he is stabilized. Even if he does have PVC CM will take some time to reverse once PVCs are gone. May or may not be able to keep him stable off milrinone. We will see.  ? ?2. Lactic acidosis ?- Suspect d/t low-output HF ?- Resolved with milrinone ?- BCx NGTD procalcitonin < 0.10 X 2 ?- No leukocytosis, AF, CXR 04/12 with no acute process ?- Milrinone stopped 4/16. Follow closely  ? ?3. CAD ?- LHC w/ mild non-obstructive CAD ?- No s/s angina ?- Continue statin ?  ?4. Mitral Regurgitation ?- Severe on echo this admit and last echo 02/23 ?- TEE showed mild restriction of P2. Severe central/posterior MR due to P2 restriction and annular dilation ?- functional, severely dilated LA and LV  ?- Seen by structural team. Considering mitral TEER.  ?- d/w Dr. Ali Lowe. Will plan limited echo today to reassess LV size  ?  ?5. Tricuspid  Regurgitation  ?- moderate on TEE  ?- functional, severely dilated RA and RV   ?- HF optimization per above  ?  ?6. Acute PE ?- Continue Eliquis. ?- No evidence of DVT on [place] ?- ? Evidence of RV strain on CTA, bu

## 2021-05-31 NOTE — Progress Notes (Signed)
?  Echocardiogram ?2D Echocardiogram has been performed. ? Matthew [name] ?05/31/2021, 9:41 AM ?

## 2021-05-31 NOTE — Progress Notes (Addendum)
?[facility]   ?MULTIDISCIPLINARY HEART VALVE TEAM ? ?Patient Name: Matthew [name] ?Date of Encounter: 05/31/2021 ? ?Admit date: 05/13/2021 ? ?Primary Care Provider: Pcp, No ?[facility] Cardiologist: Larae Grooms, MD / Dr. Haroldine Laws  ?[place] Electrophysiologist:  None  ? ?Hospital Problem List  ?   ?Principal Problem: ?  Acute pulmonary embolism ([place]) ?Active Problems: ?  Acute on chronic congestive heart failure ([place]) ?  Ascending aortic aneurysm ([place]) ?  Acute kidney injury superimposed on chronic kidney disease ([place]) ?  Dyslipidemia ?  Frequent PVCs ?  ? ? ?Subjective  ? ?Breathing is good. Has been up walking around when knee isn't bothering him.  ? ?Inpatient Medications  ?  ?Scheduled Meds: ? amiodarone  400 mg Oral BID  ? apixaban  5 mg Oral BID  ? atorvastatin  10 mg Oral Daily  ? Chlorhexidine Gluconate Cloth  6 each Topical Daily  ? leptospermum manuka honey  1 application. Topical Daily  ? magnesium oxide  400 mg Oral Daily  ? mexiletine  300 mg Oral Q12H  ? midodrine  10 mg Oral TID WC  ? pantoprazole  40 mg Oral Daily  ? polyethylene glycol  17 g Oral BID  ? sodium chloride flush  3 mL Intravenous Q12H  ? sodium chloride flush  3 mL Intravenous Q12H  ? [START ON 06/01/2021] torsemide  40 mg Oral Daily  ? ?Continuous Infusions: ? sodium chloride    ? ?PRN Meds: ?sodium chloride, acetaminophen, ondansetron (ZOFRAN) IV, sodium chloride flush, sodium chloride flush  ? ?Vital Signs  ?  ?Vitals:  ? 05/30/21 1109 05/30/21 2045 05/31/21 0047 05/31/21 0609  ?BP: 94/73 111/71 103/78   ?Pulse: 70 76 73   ?Resp: 18 17 19    ?Temp: 98.1 ?F (36.7 ?C) (!) 97.4 ?F (36.3 ?C) (!) 97.5 ?F (36.4 ?C)   ?TempSrc: Oral Oral Oral   ?SpO2:  100% 94%   ?Weight:    83.9 kg  ?Height:      ? ? ?Intake/Output Summary (Last 24 hours) at 05/31/2021 0833 ?Last data filed at 05/31/2021 0600 ?Gross per 24 hour  ?Intake --  ?Output 300 ml  ?Net -300 ml  ? ?Filed Weights  ? 05/28/21 0418 05/29/21 0615 05/31/21  0609  ?Weight: 86.4 kg 84.8 kg 83.9 kg  ? ? ?Physical Exam  ?  ?GEN: elderly. Tall thin AA male.  ?HEENT: Grossly normal.  ?Neck: Supple, no JVD, carotid bruits, or masses. ?Cardiac: irregular with frequent PVCs, 3/6 SEM. ?Respiratory:  Respirations regular and unlabored, clear to auscultation bilaterally. ?GI: Soft, nontender, nondistended, BS + x 4. ?MS: no deformity or atrophy. ?Skin: warm and dry, no rash. Right leg with bandage ?Neuro:  Strength and sensation are intact. ?Psych: AAOx3.  Normal affect. ? ?Labs  ?  ?CBC ?Recent Labs  ?  05/30/21 ?0505  ?WBC 5.7  ?HGB 12.3*  ?HCT 37.8*  ?MCV 78.8*  ?PLT 197  ? ?Basic Metabolic Panel ?Recent Labs  ?  05/29/21 ?0725 05/30/21 ?0505 05/31/21 ?[id number]  ?NA 135 132* 132*  ?K 4.5 4.9 4.9  ?CL 100 98 95*  ?CO2 27 26 27   ?GLUCOSE 78 83 85  ?BUN 37* 43* 39*  ?CREATININE 2.19* 2.11* 2.16*  ?CALCIUM 8.9 9.2 9.4  ?MG 1.8 1.9  --   ? ?Liver Function Tests ?No results for input(s): AST, ALT, ALKPHOS, BILITOT, PROT, ALBUMIN in the last 72 hours. ?No results for input(s): LIPASE, AMYLASE in the last 72 hours. ?  Cardiac Enzymes ?No results for input(s): CKTOTAL, CKMB, CKMBINDEX, TROPONINI in the last 72 hours. ?BNP ?Invalid input(s): POCBNP ?D-Dimer ?No results for input(s): DDIMER in the last 72 hours. ?Hemoglobin A1C ?No results for input(s): HGBA1C in the last 72 hours. ?Fasting Lipid Panel ?No results for input(s): CHOL, HDL, LDLCALC, TRIG, CHOLHDL, LDLDIRECT in the last 72 hours. ?Thyroid Function Tests ?No results for input(s): TSH, T4TOTAL, T3FREE, THYROIDAB in the last 72 hours. ? ?Invalid input(s): FREET3 ? ?Telemetry  ?  ?Sinus with frequent PVCs - Personally Reviewed ? ?ECG  ?  ?Sinus with 1st deg AV block, LAD, nonspecific IVCD, frequent polymorphic PVCs, HR 98 - Personally Reviewed ? ?Radiology  ?  ?No results found. ? ?Cardiac Studies  ? ?TEE:  Mild restriction of P2. Severe central/posterior MR due to P2 restriction and annular dilation. EF 15-20%, LV severely dilated w/  severe HK. RV dilated + severe HK. Mod TR  ?  ?R/LHCAssessment: ?1. Mild non-obstructive CAD ?2. NICM EF < 20% ?3. Low filling pressures with normal cardiac output ?4. No significant v-waves in PCWP tracing despite presence of severe MR on TEE (may be influenced by low BP and filling pressures) ?  ?Ao = 84/60 (70) ?LV = 83/6 ?RA = 1 ?RV = 20/5 ?PA = 21/7 (14) ?PCW = 3 (no prominent v waves) ?Fick cardiac output/index = 6.1/3.0 ?PVR = 1.8 WU ?Ao sat = 96% ?PA sat = 71%, 74% ?  ?  ?cMRI 05/20/21 ? ?IMPRESSION: ?1. Limited study as patient refused to continue shortly after ?contrast administration. Single shot LGE images and post contrast T1 ?maps were obtained, but high resolution LGE images and flow ?quantification to assess mitral/tricuspid regurgitation were not ?done ?  ?2. Unable to quantify volumes due to significant motion artifact on ?cine images likely from frequent PVCs, but visually LV is severely ?dilated with severe systolic dysfunction ?  ?3. Visually RV is severely dilated with moderate systolic ?dysfunction ?  ?4. Basal septal midwall late gadolinium enhancement, which is a scar ?pattern seen in nonischemic cardiomyopathies and associated with ?worse prognosis ?  ?5. RV insertion site LGE, which is a nonspecific finding often seen ?in setting of elevated pulmonary pressures ?  ?6.  Ascending aortic aneurysm measuring 16mm ? ?Patient Profile  ?   ?Matthew [name] is a 81 y.o. male with a hx of NICM/chronic biventricular failure, CAD with severe stenosis of the diagonal and lower limb of OM1 by cath in 2001 (treated medically), CKD IIIb, HTN, HLD, severe mitral regurgitation, moderate tricuspid regurgitation, ascending thoracic aortic aneurysm (4.5cm), obesity, arthritis, thrombocytopenia and PVCs who presented to [facility] on 3/31/2 for acute dyspnea and found to have acute on chronic HFrEF with severe MR and new pulmonary embolism. Structural heart has been consulted for consideration of mTEER.  ? ?Assessment  & Plan  ?  ?Biventricular heart failure with low output heart failure: under the care of the advanced CHF service. Now off milrinone. R/LHC 4/5 with mild non-obstructive CAD, NICM EF < 20%, Low filling pressures with normal cardiac output. cMRI c/w severe NICM w/ basal septal midwall LGE . Felt to be multifactorial 2/2 PVC cardiomyopathy and severe MR.  ? ?Severe Functional MR: TEE showed mild restriction of P2. Severe central/posterior MR due to P2 restriction and annular dilation 2/2 severely dilated LA and LV. Plan for repeat limited echo today to assess LV dilation and potential benefit of mTEER. If LV cavity not too far dilated, we could consider proceeding with possible Clip  this Thursday.  ? ?NSVT/Frequent PVCs: tele with trigeminy and PVCs, ~20/30 min. Continue amiodarone and mexiletine. Not a candidate for ablation per EP. ? ?CKD stage IIIB: Scr stable, 2.19>>2.11>>2.16. BUN 37>>43>>39. Torsemide held today with poor PO intake.  ? ?Hyponatremia: sodium stable around 132. ? ?Lactic acidosis: suspected to be d/t low-output HF and resolved with milrinone ? ?Pulmonary embolus: he has been treated with Eliquis. This would need to be held if we proceed with mTEER.  ? ?Signed, ?Angelena Form, PA-C  ?05/31/2021, 8:33 AM  ?Pager [phone] ? ? ?ATTENDING ATTESTATION: ? ?After conducting a review of all available clinical information with the care team, interviewing the patient, and performing a physical exam, I agree with the findings and plan described in this note. ?  ?GEN: No acute distress.   ?HEENT:  MMM, no JVD, no scleral icterus ?Cardiac: RRR with occasional ectopy, + holosystolic murmur ?Respiratory: Clear to auscultation bilaterally. ?GI: Soft, nontender, non-distended  ?MS: No edema; No deformity.  Pretibial ulcer right leg without signs of infection. ?Neuro:  Nonfocal  ?Vasc:  +2 radial pulses ? ?The patient remains in the hospital since I saw him last a few weeks ago.  In the interim there has been no  significant progress in regards to suppressing his PVC burden and electrophysiology did not think he was an ablation candidate.  He continues to diurese quite well and since his last echo which demons

## 2021-06-01 ENCOUNTER — Inpatient Hospital Stay (HOSPITAL_COMMUNITY): Payer: Medicare Other

## 2021-06-01 DIAGNOSIS — I5023 Acute on chronic systolic (congestive) heart failure: Secondary | ICD-10-CM | POA: Diagnosis not present

## 2021-06-01 DIAGNOSIS — E785 Hyperlipidemia, unspecified: Secondary | ICD-10-CM | POA: Diagnosis not present

## 2021-06-01 DIAGNOSIS — N179 Acute kidney failure, unspecified: Secondary | ICD-10-CM | POA: Diagnosis not present

## 2021-06-01 DIAGNOSIS — I2699 Other pulmonary embolism without acute cor pulmonale: Secondary | ICD-10-CM | POA: Diagnosis not present

## 2021-06-01 LAB — BASIC METABOLIC PANEL WITH GFR
Anion gap: 9 (ref 5–15)
BUN: 42 mg/dL — ABNORMAL HIGH (ref 8–23)
CO2: 27 mmol/L (ref 22–32)
Calcium: 9.5 mg/dL (ref 8.9–10.3)
Chloride: 98 mmol/L (ref 98–111)
Creatinine, Ser: 2.44 mg/dL — ABNORMAL HIGH (ref 0.61–1.24)
GFR, Estimated: 26 mL/min — ABNORMAL LOW
Glucose, Bld: 98 mg/dL (ref 70–99)
Potassium: 5.1 mmol/L (ref 3.5–5.1)
Sodium: 134 mmol/L — ABNORMAL LOW (ref 135–145)

## 2021-06-01 LAB — COOXEMETRY PANEL
Carboxyhemoglobin: 2 % — ABNORMAL HIGH (ref 0.5–1.5)
Carboxyhemoglobin: 2 % — ABNORMAL HIGH (ref 0.5–1.5)
Methemoglobin: <0.7 % (ref 0.0–1.5)
Methemoglobin: 0.7 % (ref 0.0–1.5)
O2 Saturation: 54.6 %
O2 Saturation: 73.2 %
Total hemoglobin: 12.9 g/dL (ref 12.0–16.0)
Total hemoglobin: 12.9 g/dL (ref 12.0–16.0)

## 2021-06-01 LAB — ABO/RH: ABO/RH(D): A POS

## 2021-06-01 LAB — TYPE AND SCREEN
ABO/RH(D): A POS
Antibody Screen: NEGATIVE

## 2021-06-01 IMAGING — CR DG CHEST 2V
2 series · 3 of 3 positions shown · non-contrast
Comparison: Chest x-ray [DATE].

CLINICAL DATA: Preoperative.

EXAM:
CHEST - 2 VIEW

[chest lat]
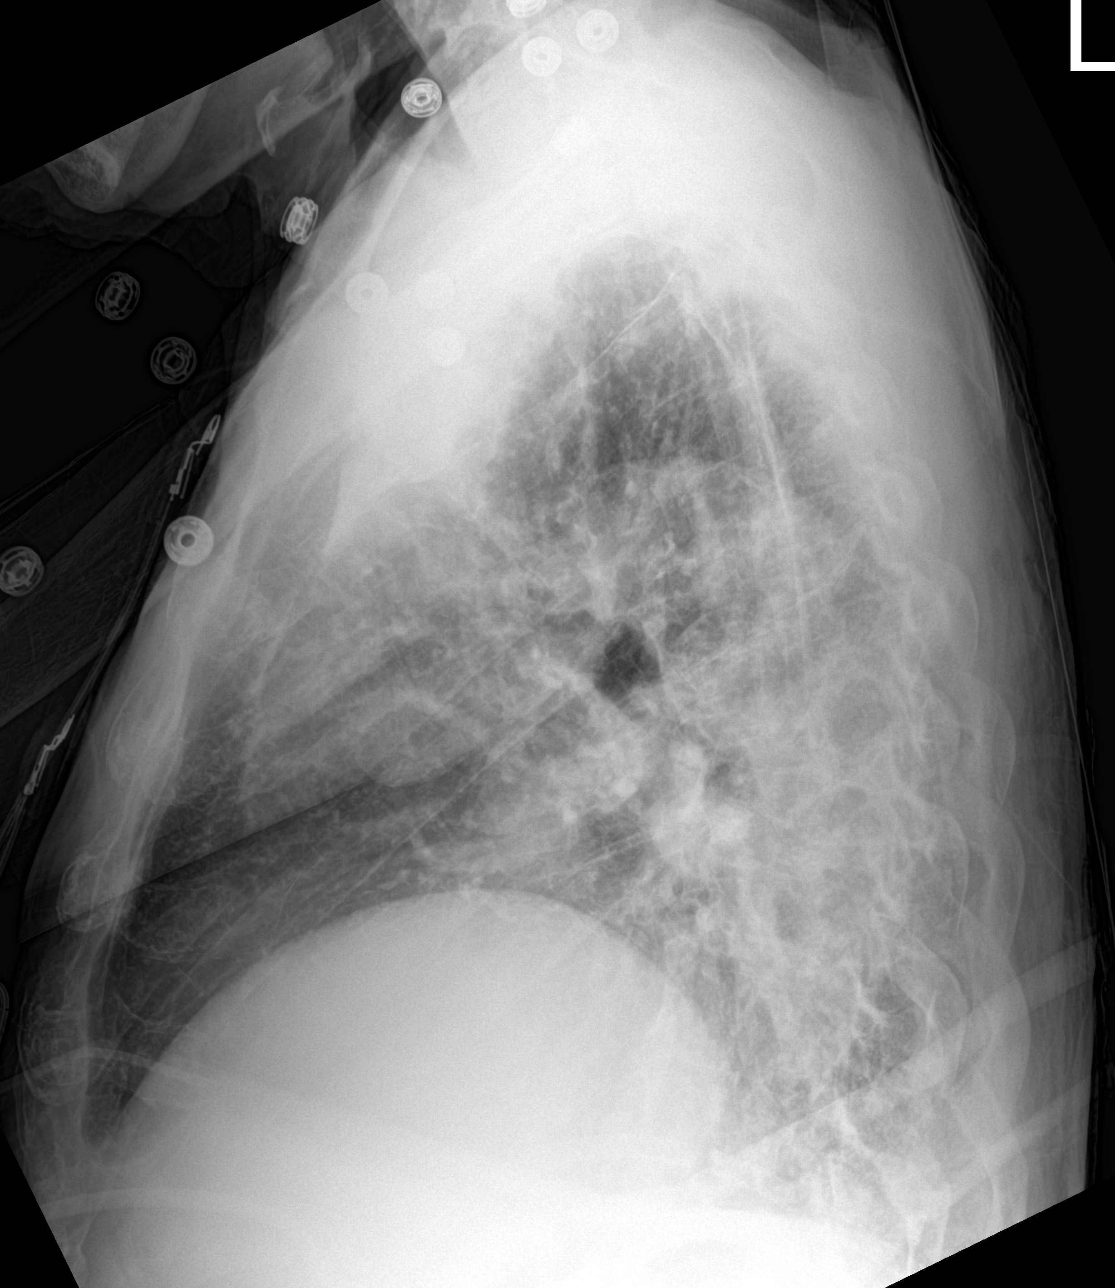

[Series 3: chest ap · 0.14mm/px · 2 of 2 slices shown]
[im 1/2]
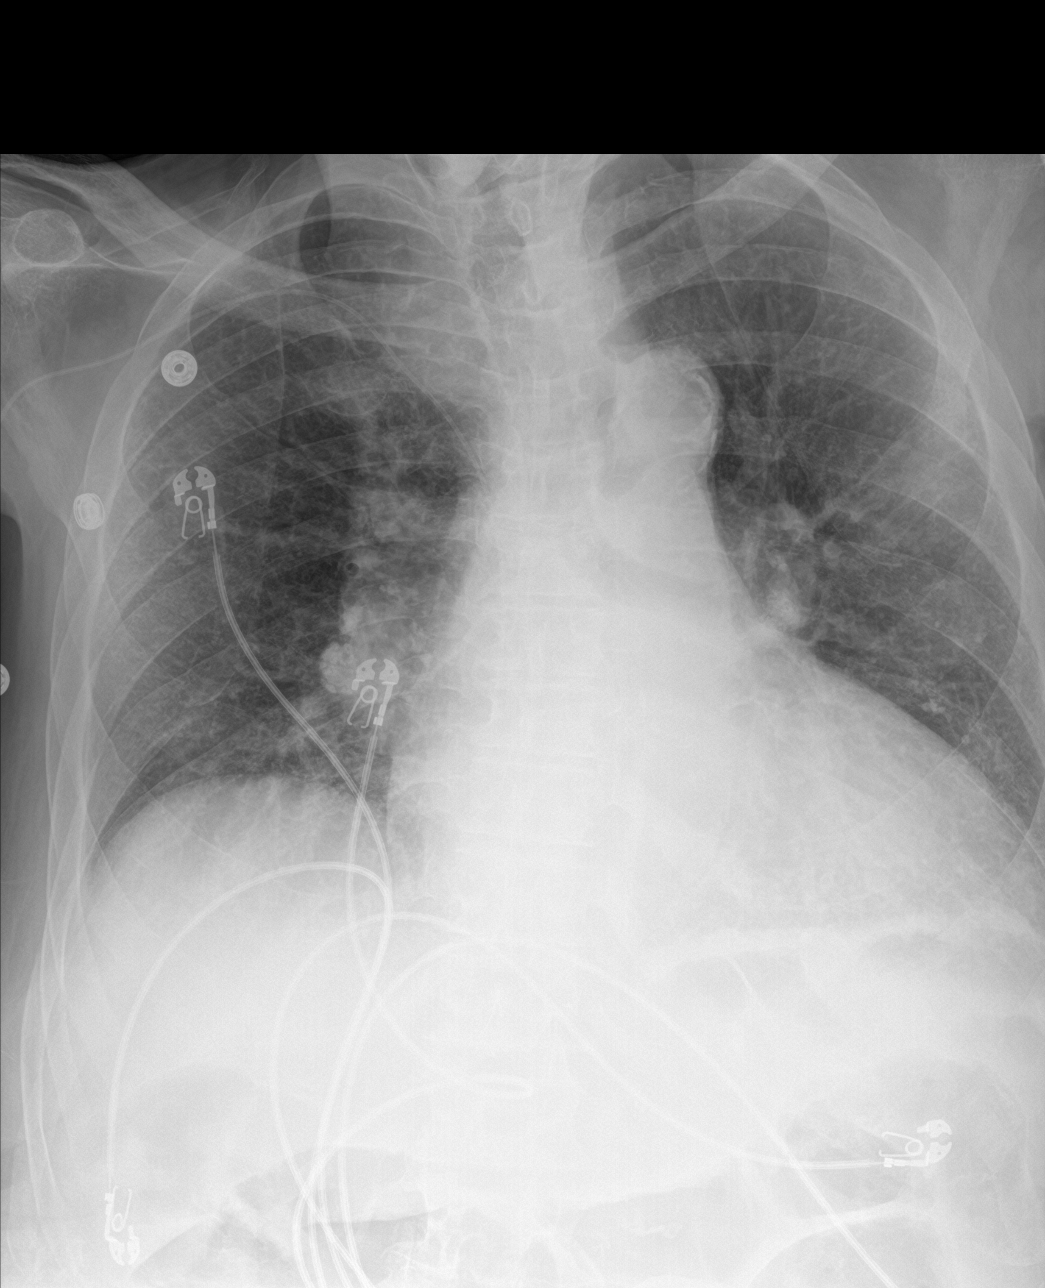
[im 2/2]
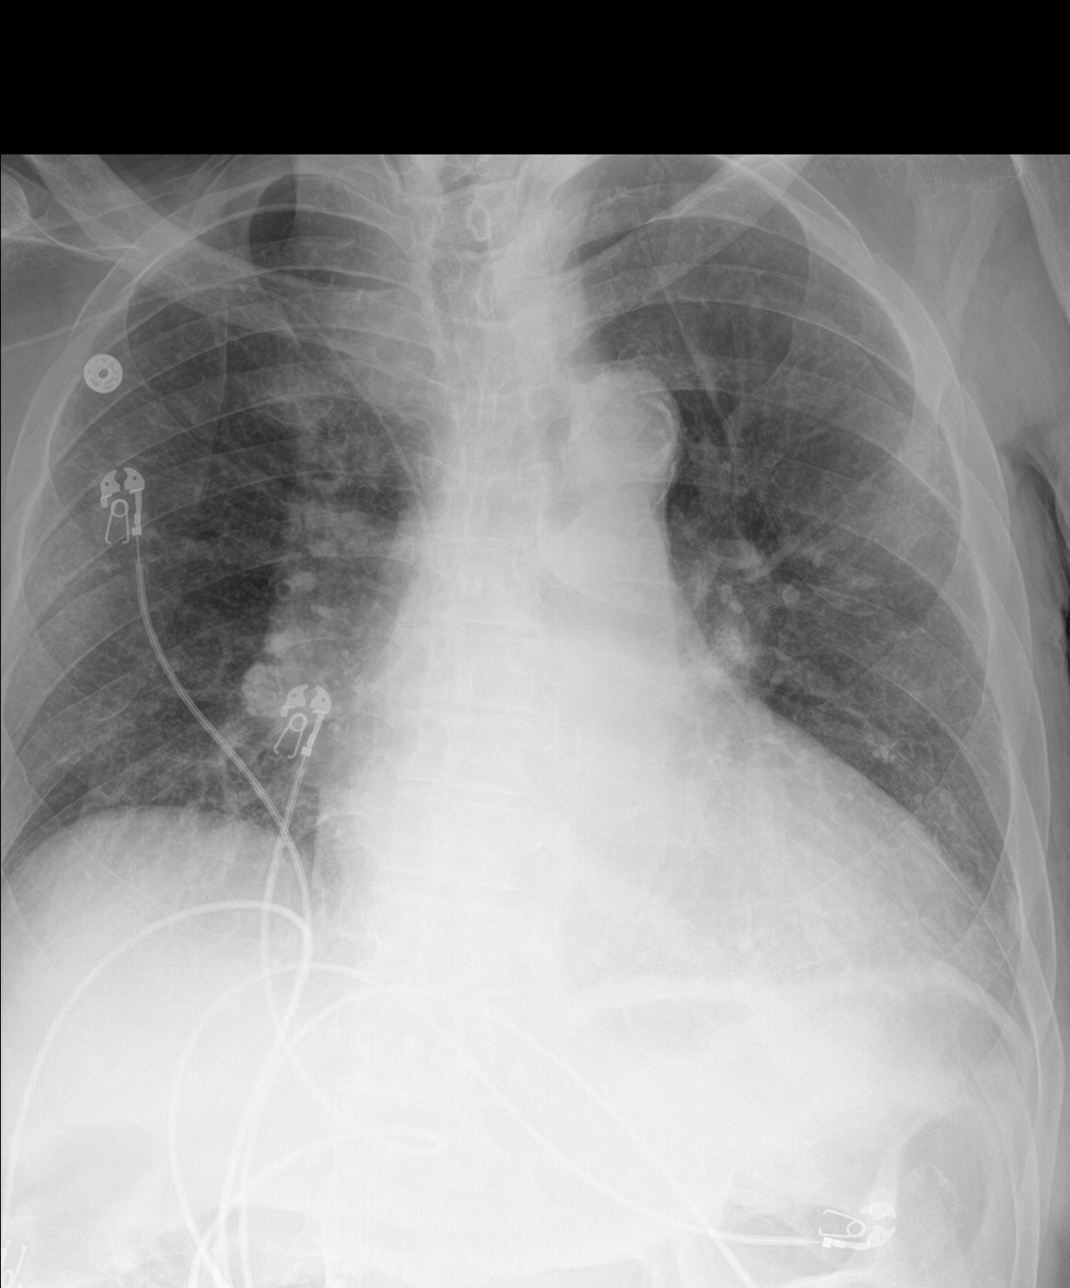

[3 of 3 positions shown; findings below may reference images not displayed]

FINDINGS: Right-sided central venous catheter tip projects over the SVC. The
cardiomediastinal silhouette is stable, the heart is enlarged. There
is no focal lung consolidation, pleural effusion or pneumothorax.
There are minimal atelectatic changes in the lung bases. No acute
fractures are seen.
IMPRESSION: 1. Stable cardiomegaly.
2. No acute cardiopulmonary process.

## 2021-06-01 MED ORDER — NOREPINEPHRINE 4 MG/250ML-% IV SOLN
0.0000 ug/min | INTRAVENOUS | Status: DC
  Filled 2021-06-01: qty 250

## 2021-06-01 MED ORDER — HEPARIN 30,000 UNITS/1000 ML (OHS) CELLSAVER SOLUTION
Status: DC
  Filled 2021-06-01: qty 1000

## 2021-06-01 MED ORDER — TRANEXAMIC ACID (OHS) BOLUS VIA INFUSION
15.0000 mg/kg | INTRAVENOUS | Status: DC
Start: 2021-06-02 — End: 2021-06-02
  Filled 2021-06-01: qty 1259

## 2021-06-01 MED ORDER — APIXABAN 5 MG PO TABS
5.0000 mg | ORAL_TABLET | Freq: Once | ORAL | Status: AC
  Administered 2021-06-01: 5 mg via ORAL
  Filled 2021-06-01: qty 1

## 2021-06-01 MED ORDER — HEPARIN (PORCINE) 25000 UT/250ML-% IV SOLN: 1700.0000 [IU]/h | INTRAVENOUS | Status: DC

## 2021-06-01 MED ORDER — ENSURE ENLIVE PO LIQD
237.0000 mL | Freq: Three times a day (TID) | ORAL | Status: DC
  Administered 2021-06-01 – 2021-06-07 (×12): 237 mL via ORAL

## 2021-06-01 MED ORDER — EPINEPHRINE HCL 5 MG/250ML IV SOLN IN NS
0.0000 ug/min | INTRAVENOUS | Status: DC
  Filled 2021-06-01: qty 250

## 2021-06-01 MED ORDER — SENNA 8.6 MG PO TABS
2.0000 | ORAL_TABLET | Freq: Every day | ORAL | Status: DC
  Administered 2021-06-01: 17.2 mg via ORAL
  Filled 2021-06-01: qty 2

## 2021-06-01 MED ORDER — POTASSIUM CHLORIDE 2 MEQ/ML IV SOLN
80.0000 meq | INTRAVENOUS | Status: DC
  Filled 2021-06-01: qty 40

## 2021-06-01 MED ORDER — PLASMA-LYTE A IV SOLN
INTRAVENOUS | Status: DC
  Filled 2021-06-01: qty 2.5

## 2021-06-01 MED ORDER — NITROGLYCERIN IN D5W 200-5 MCG/ML-% IV SOLN
2.0000 ug/min | INTRAVENOUS | Status: DC
  Filled 2021-06-01: qty 250

## 2021-06-01 MED ORDER — BISACODYL 5 MG PO TBEC
5.0000 mg | DELAYED_RELEASE_TABLET | Freq: Once | ORAL | Status: AC
  Administered 2021-06-01: 5 mg via ORAL
  Filled 2021-06-01: qty 1

## 2021-06-01 MED ORDER — TRANEXAMIC ACID 1000 MG/10ML IV SOLN
1.5000 mg/kg/h | INTRAVENOUS | Status: DC
  Filled 2021-06-01: qty 25

## 2021-06-01 MED ORDER — TRANEXAMIC ACID (OHS) PUMP PRIME SOLUTION
2.0000 mg/kg | INTRAVENOUS | Status: DC
  Filled 2021-06-01: qty 1.68

## 2021-06-01 MED ORDER — MAGNESIUM SULFATE 50 % IJ SOLN
40.0000 meq | INTRAMUSCULAR | Status: DC
  Filled 2021-06-01: qty 9.85

## 2021-06-01 MED ORDER — INSULIN REGULAR(HUMAN) IN NACL 100-0.9 UT/100ML-% IV SOLN
INTRAVENOUS | Status: DC
Start: 2021-06-02 — End: 2021-06-02
  Filled 2021-06-01: qty 100

## 2021-06-01 MED ORDER — CEFAZOLIN SODIUM-DEXTROSE 2-4 GM/100ML-% IV SOLN
2.0000 g | INTRAVENOUS | Status: AC
  Administered 2021-06-02: 2 g via INTRAVENOUS
  Filled 2021-06-01 (×2): qty 100

## 2021-06-01 MED ORDER — TORSEMIDE 20 MG PO TABS: 40.0000 mg | ORAL_TABLET | Freq: Every day | ORAL | Status: DC

## 2021-06-01 MED ORDER — MILRINONE LACTATE IN DEXTROSE 20-5 MG/100ML-% IV SOLN
0.3000 ug/kg/min | INTRAVENOUS | Status: AC
  Administered 2021-06-02: .125 ug/kg/min via INTRAVENOUS
  Filled 2021-06-01: qty 100

## 2021-06-01 MED ORDER — TEMAZEPAM 15 MG PO CAPS: 15.0000 mg | ORAL_CAPSULE | Freq: Once | ORAL | Status: DC | PRN

## 2021-06-01 MED ORDER — CEFAZOLIN SODIUM-DEXTROSE 2-4 GM/100ML-% IV SOLN
2.0000 g | INTRAVENOUS | Status: DC
  Filled 2021-06-01: qty 100

## 2021-06-01 MED ORDER — SORBITOL 70 % SOLN
60.0000 mL | Freq: Once | Status: AC
  Administered 2021-06-01: 60 mL via ORAL
  Filled 2021-06-01: qty 60

## 2021-06-01 MED ORDER — MILRINONE LACTATE IN DEXTROSE 20-5 MG/100ML-% IV SOLN
0.1250 ug/kg/min | INTRAVENOUS | Status: DC
  Administered 2021-06-01 – 2021-06-05 (×4): 0.125 ug/kg/min via INTRAVENOUS
  Filled 2021-06-01 (×3): qty 100

## 2021-06-01 MED ORDER — CHLORHEXIDINE GLUCONATE 0.12 % MT SOLN
15.0000 mL | Freq: Once | OROMUCOSAL | Status: AC
  Administered 2021-06-02: 15 mL via OROMUCOSAL
  Filled 2021-06-01: qty 15

## 2021-06-01 MED ORDER — CHLORHEXIDINE GLUCONATE 4 % EX LIQD
1.0000 | Freq: Once | CUTANEOUS | Status: AC
Start: 2021-06-01 — End: 2021-06-01
  Administered 2021-06-01: 1 via TOPICAL
  Filled 2021-06-01: qty 15

## 2021-06-01 MED ORDER — PHENYLEPHRINE HCL-NACL 20-0.9 MG/250ML-% IV SOLN
30.0000 ug/min | INTRAVENOUS | Status: DC
  Filled 2021-06-01: qty 250

## 2021-06-01 MED ORDER — HEPARIN (PORCINE) 25000 UT/250ML-% IV SOLN
1700.0000 [IU]/h | INTRAVENOUS | Status: DC
  Administered 2021-06-01: 1700 [IU]/h via INTRAVENOUS
  Filled 2021-06-01: qty 250

## 2021-06-01 MED ORDER — VANCOMYCIN HCL 1250 MG/250ML IV SOLN
1250.0000 mg | INTRAVENOUS | Status: DC
Start: 2021-06-02 — End: 2021-06-02
  Filled 2021-06-01: qty 250

## 2021-06-01 MED ORDER — DEXMEDETOMIDINE HCL IN NACL 400 MCG/100ML IV SOLN
0.1000 ug/kg/h | INTRAVENOUS | Status: DC
  Filled 2021-06-01: qty 100

## 2021-06-01 NOTE — Progress Notes (Signed)
Inpatient Rehab Admissions Coordinator:  ? ?Pt. With Mitral TEER  planned for Thursday. CIR will follow up for potential CIR admit afterwards. ? ?Megan Salon, MS, CCC-SLP ?Rehab Admissions Coordinator  ?[phone] (celll) ?[phone] (office) ? ?

## 2021-06-01 NOTE — Progress Notes (Signed)
?Progress Note ? ? ?Patient: JAGR POPP [id number] DOB: 24-Apr-1940 DOA: 05/13/2021     18 ?DOS: the patient was seen and examined on 06/01/2021 ?  ?Brief hospital course: ?Mr. Bonomi was admitted to the hospital with the working diagnosis of decompensated heart failure in the setting of mitral regurgitation.  ? ?80 yo male with the past medical history of CAD, heart failure, hypertension, severe mitral regurgitation, and BPH, who presented with chest pain and dyspnea. Reported acute onset of substernal/ left sided chest pain, radiated to his left arm and associated with dyspnea. Positive lower extremity edema.  ?Recent hospitalization 03/25 to 05/10/21 for urinary tract infection, complicated with metabolic encephalopathy. Outpatient follow up with cardiology 24 hrs before patient admitted this time, diuretic therapy was increased and plan for further work up with cardiac catheterization and transesophageal echocardiogram. On his initial physical examination his blood pressure was 106/95, HR 107, RR 22, 02 saturation 97%, positive JVD, heart with S1 and S2 present and tachycardic, lungs with no wheezing or rales, abdomen not distended and positive lower extremity edema.  ? ?Na 136, K 5.0, Cl 102, bicarbonate at 25, glucose 91 bun 18, cr 1,42 ?BNP >4,500 ?Wbc 4.3, hgb 12.9 hct 41.1 plt 104 ? ?Chest radiograph with cardiomegaly, positive hilar vascular congestion and fluid in the right fissure.  ? ?EKG 93 bpm, normal axis, qtc 509, right bundle branch block, sinus rhythm with PVC, no significant ST segment or T wave changes.  ? ?CT chest with subsegmental and segmental pulmonary artery fillings defects in the right lower lobe. Small bilateral pleural effusions,  ?Aneurysmal dilatation of the ascending aorta.  ? ?Patient was placed on furosemide for diuresis, and anticoagulation with apixaban.  ?Diuresis with furosemide.  ?Frequent PVC and NSVT, placed on amiodarone and mexiletine. ? ?Cardiac catheterization  with no significant coronary artery disease. Pulmonary capillary wedge pressure 3. CO/CI 6.1/3.0 ?TEE with LV EF less than 20%, severe reduction in RV systolic function. Sever mitral regurgitation.  ? ?Plan for possible mitral valve intervention later this month. ?His volume status has improved but positive hypoperfusion. ?04/13 resumed on milrinone infusion.  ? ?Patient continue to have ectopy with PVC despite antiarrhythmic therapy. Plan for mitral transcatheter edge to edge repair tomorrow.  ? ?Plan to transfer patient to CIR when renal function improves.  ? ? ?Assessment and Plan: ?* Acute pulmonary embolism ([place]) ?Holding apixaban in preparation for valve intervention.  ?Plan to resume anticoagulation before discharge.  ? ?Acute on chronic congestive heart failure ([place]) ?Patient was admitted to the cardiac ward and was placed IV furosemide for diuresis, negative fluid balance was achieved -18,204 ml, with significant improvement in his symptoms.  ? ?Echocardiogram with LV systolic function reduced to 25%, with global hypokinesis. RV systolic function is preserved. RV size with severe dilatation, RVSP 35,6 mmHg, severe dilatation of right and left atrium. Severe mitral regurgitation.  ? ?Patient with frequent PVC and NSVT, patient has been placed on amiodarone and mexiletin to suppress ectopy that possible are driving his cardiomyopathy.   ? ?Patient with signs of hypoperfusion.   ?Currently on milrinone infusion and continue with midodrine for blood pressure support.  ? ?Per EP recommendations continue with amiodarone and mexiletine. ?Plan for mitral valve repair tomorrow, transcatheter procedure, edge to edge repair.  ? ?Acute kidney injury superimposed on chronic kidney disease ([place]) ?CKD stage 3a, Hyponatremia. Hypomagnesemia. Hyperkalemia  ? ?Renal function with serum cr at 2,44 with K at 5,1 and serum bicarbonate at 27., Na 134. ? ?  Plan to continue support with milrinone and midodrine. ?Holding diuresis  for now.  ? ?Dyslipidemia ?Continue with statin therapy.  ? ?Ascending aortic aneurysm ([place]) ?CT showing aneurysmal dilation of ascending aorta measuring 4.5 cm.  ?-Radiologist recommending semi-annual imaging followup by CTA or MRA ?-Outpatient cardiothoracic surgery follow-up ? ? ? ? ?  ? ?Subjective: Patient with no chest pain or dyspnea.  ? ?Physical Exam: ?Vitals:  ? 05/31/21 1648 05/31/21 2120 06/01/21 0526 06/01/21 0833  ?BP: 108/68 113/77 109/78 116/76  ?Pulse:  69 82   ?Resp: 18 20 20 18   ?Temp:  98.3 ?F (36.8 ?C) (!) 97.2 ?F (36.2 ?C) 97.7 ?F (36.5 ?C)  ?TempSrc:  Oral Oral Oral  ?SpO2:  95% 96%   ?Weight:      ?Height:      ? ? ?Neurology awake and alert ?ENT with no pallor ?Cardiovascular with S1 and S2 present and rhythmic, positive murmur at the apex 3/6 systolic  ?No jvd ?No lower extremity edema ?Respiratory with no rales or wheezing ?Abdomen soft and not distended  ?Data Reviewed: ? ? ? ?Family Communication: I spoke with patient's son at the bedside, we talked in detail about patient's condition, plan of care and prognosis and all questions were addressed. ? ? ?Disposition: ?Status is: Inpatient ?Remains inpatient appropriate because: pending mitral valve repair  ? Planned Discharge Destination:  CIR ? ? ? ? ? ?Author: ?Tawni Millers, MD ?06/01/2021 1:39 PM ? ?For on call review [email or web address].  ?

## 2021-06-01 NOTE — Progress Notes (Addendum)
? ? Advanced Heart Failure Rounding Note ? ?PCP-Cardiologist: Lance Muss, MD  ? ?Subjective:   ? ?04/05: RHC Low filling pressures with normal CO.  ?04/06: Seen by structural team for consideration of mitral TEER ?04/12: Started on milrinone 0.125, lactic acid 2.8>>1.7>1.6 ?04/16: Milrinone discontinued  ? ?Co-ox down again, off milrinone, at 55% w/ bump in SCr  2.16>>2.44. K 5.1  ? ?CVP 5-6. Denies dyspnea.  ? ?C/w frequent PVCs 25-30/min.  ? ?Feeling better but still constipated despite sorbitol.  ? ?His preacher is visiting at bed side.  ? ?TEE:  Mild restriction of P2. Severe central/posterior MR due to P2 restriction and annular dilation. EF 15-20%, LV severely dilated w/ severe HK. RV dilated + severe HK. Mod TR  ? ?R/LHCAssessment: ?1. Mild non-obstructive CAD ?2. NICM EF < 20% ?3. Low filling pressures with normal cardiac output ?4. No significant v-waves in PCWP tracing despite presence of severe MR on TEE (may be influenced by low BP and filling pressures) ? ?Ao = 84/60 (70) ?LV = 83/6 ?RA = 1 ?RV = 20/5 ?PA = 21/7 (14) ?PCW = 3 (no prominent v waves) ?Fick cardiac output/index = 6.1/3.0 ?PVR = 1.8 WU ?Ao sat = 96% ?PA sat = 71%, 74% ? ? ?cMRI 05/20/21 ? ?IMPRESSION: ?1. Limited study as patient refused to continue shortly after ?contrast administration. Single shot LGE images and post contrast T1 ?maps were obtained, but high resolution LGE images and flow ?quantification to assess mitral/tricuspid regurgitation were not ?done ?  ?2. Unable to quantify volumes due to significant motion artifact on ?cine images likely from frequent PVCs, but visually LV is severely ?dilated with severe systolic dysfunction ?  ?3. Visually RV is severely dilated with moderate systolic ?dysfunction ?  ?4. Basal septal midwall late gadolinium enhancement, which is a scar ?pattern seen in nonischemic cardiomyopathies and associated with ?worse prognosis ?  ?5. RV insertion site LGE, which is a nonspecific finding often  seen ?in setting of elevated pulmonary pressures ?  ?6.  Ascending aortic aneurysm measuring 49mm ? ?Objective:   ?Weight Range: ?83.9 kg ?Body mass index is 20.83 kg/m?.  ? ?Vital Signs:   ?Temp:  [97.2 ?F (36.2 ?C)-98.3 ?F (36.8 ?C)] 97.2 ?F (36.2 ?C) (04/19 0526) ?Pulse Rate:  [69-82] 82 (04/19 0526) ?Resp:  [16-20] 20 (04/19 0526) ?BP: (102-113)/(68-85) 109/78 (04/19 0526) ?SpO2:  [95 %-96 %] 96 % (04/19 0526) ?Last BM Date : 05/28/21 ? ?Weight change: ?Filed Weights  ? 05/28/21 0418 05/29/21 0615 05/31/21 0609  ?Weight: 86.4 kg 84.8 kg 83.9 kg  ? ? ?Intake/Output:  ? ?Intake/Output Summary (Last 24 hours) at 06/01/2021 0810 ?Last data filed at 06/01/2021 0526 ?Gross per 24 hour  ?Intake --  ?Output 600 ml  ?Net -600 ml  ? ?  ? ? ?Physical Exam  ? ?CVP 5-6  ?General:  Well appearing. No respiratory difficulty ?HEENT: normal ?Neck: supple. JVD 6 cm. Carotids 2+ bilat; no bruits. No lymphadenopathy or thyromegaly appreciated. ?Cor: PMI nondisplaced. Irregular rhythm (frequent PVCs). 3/6 MR murmur  ?Lungs: clear ?Abdomen: soft, nontender, nondistended. No hepatosplenomegaly. No bruits or masses. Good bowel sounds. ?Extremities: no cyanosis, clubbing, rash, edema + RUE PICC  ?Neuro: alert & oriented x 3, cranial nerves grossly intact. moves all 4 extremities w/o difficulty. Affect pleasant. ? ?Telemetry  ? ?SR 80-90s  Frequent PVCs/ ventricular trigeminy  ~25-30/min. Personally reviewed ? ?Labs  ?  ?CBC ?Recent Labs  ?  05/30/21 ?0505  ?WBC 5.7  ?HGB 12.3*  ?  HCT 37.8*  ?MCV 78.8*  ?PLT 197  ? ? ?Basic Metabolic Panel ?Recent Labs  ?  05/30/21 ?0505 05/31/21 ?4403 06/01/21 ?4742  ?NA 132* 132* 134*  ?K 4.9 4.9 5.1  ?CL 98 95* 98  ?CO2 26 27 27   ?GLUCOSE 83 85 98  ?BUN 43* 39* 42*  ?CREATININE 2.11* 2.16* 2.44*  ?CALCIUM 9.2 9.4 9.5  ?MG 1.9  --   --   ? ?Liver Function Tests ?No results for input(s): AST, ALT, ALKPHOS, BILITOT, PROT, ALBUMIN in the last 72 hours. ?No results for input(s): LIPASE, AMYLASE in the last 72  hours. ?Cardiac Enzymes ?No results for input(s): CKTOTAL, CKMB, CKMBINDEX, TROPONINI in the last 72 hours. ? ?BNP: ?BNP (last 3 results) ?Recent Labs  ?  05/09/21 ?05/11/21 05/10/21 ?0205 05/13/21 ?2142  ?BNP >4,500.0* >4,500.0* >4,500.0*  ? ? ?ProBNP (last 3 results) ?No results for input(s): PROBNP in the last 8760 hours. ? ? ?D-Dimer ?No results for input(s): DDIMER in the last 72 hours. ?Hemoglobin A1C ?No results for input(s): HGBA1C in the last 72 hours. ?Fasting Lipid Panel ?No results for input(s): CHOL, HDL, LDLCALC, TRIG, CHOLHDL, LDLDIRECT in the last 72 hours. ?Thyroid Function Tests ?No results for input(s): TSH, T4TOTAL, T3FREE, THYROIDAB in the last 72 hours. ? ?Invalid input(s): FREET3 ? ?Other results: ? ? ?Imaging  ? ? ?ECHOCARDIOGRAM LIMITED ? ?Result Date: 05/31/2021 ?   ECHOCARDIOGRAM LIMITED REPORT   Patient Name:   Matthew [name] Date of Exam: 05/31/2021 Medical Rec #:  06/02/2021        Height:       79.0 in Accession #:    [id number]       Weight:       184.9 lb Date of Birth:  09/08/1940        BSA:          2.204 m? Patient Age:    81 years         BP:           102/85 mmHg Patient Gender: M                HR:           73 bpm. Exam Location:  Inpatient Procedure: Limited Echo, 3D Echo, Cardiac Doppler and Color Doppler Indications:    Mitral valve insufficiency I34.0  History:        Patient has prior history of Echocardiogram examinations, most                 recent 05/20/2021. CHF, CAD; Risk Factors:Hypertension and                 Dyslipidemia. Chronic kidney disease. Thoracic aortic                 aneurysm.  Sonographer:    07/20/2021 RDCS Referring Phys: Leta Jungling [id number] IMPRESSIONS  1. Left ventricular ejection fraction, by estimation, is 25 to 30%. The left ventricle has severely decreased function. The left ventricle demonstrates global hypokinesis. The left ventricular internal cavity size was severely dilated.  2. Right ventricular systolic function is moderately reduced.  The right ventricular size is severely enlarged. There is mildly elevated pulmonary artery systolic pressure. The estimated right ventricular systolic pressure is 36.3 mmHg.  3. Severe mitral regurgitation 2/2 restricted PMVL (functional MR). Severe mitral valve regurgitation.  4. The aortic valve is tricuspid. Aortic valve regurgitation is mild. No aortic stenosis is present.  5. There  is mild dilatation of the ascending aorta, measuring 43 mm.  6. The inferior vena cava is dilated in size with <50% respiratory variability, suggesting right atrial pressure of 15 mmHg. Comparison(s): No significant change from prior study. FINDINGS  Left Ventricle: Left ventricular ejection fraction, by estimation, is 25 to 30%. The left ventricle has severely decreased function. The left ventricle demonstrates global hypokinesis. The left ventricular internal cavity size was severely dilated. There is no left ventricular hypertrophy. Right Ventricle: The right ventricular size is severely enlarged. No increase in right ventricular wall thickness. Right ventricular systolic function is moderately reduced. There is mildly elevated pulmonary artery systolic pressure. The tricuspid regurgitant velocity is 2.31 m/s, and with an assumed right atrial pressure of 15 mmHg, the estimated right ventricular systolic pressure is 36.3 mmHg. Mitral Valve: Severe mitral regurgitation 2/2 restricted PMVL (functional MR). Severe mitral valve regurgitation. Tricuspid Valve: The tricuspid valve is grossly normal. Tricuspid valve regurgitation is mild . No evidence of tricuspid stenosis. Aortic Valve: The aortic valve is tricuspid. Aortic valve regurgitation is mild. No aortic stenosis is present. Aorta: The aortic root is normal in size and structure. There is mild dilatation of the ascending aorta, measuring 43 mm. Venous: The right lower pulmonary vein is normal. The inferior vena cava is dilated in size with less than 50% respiratory variability,  suggesting right atrial pressure of 15 mmHg. LEFT VENTRICLE PLAX 2D LVIDd:         7.30 cm LVIDs:         6.10 cm LV PW:         1.10 cm LV IVS:        1.10 cm LVOT diam:     2.00 cm      3D Volume EF

## 2021-06-01 NOTE — Progress Notes (Signed)
Physical Therapy Treatment ?Patient Details ?Name: Matthew [name] ?MRN: [id number] ?DOB: [birth year] ?Today's Date: 06/01/2021 ? ? ?History of Present Illness 81 y.o. male adm 3/31 with SOB with RLL PE treated with Heparin> Eliquis. Admission 3/25-28/2023 with AMS and UTI. L radial heart cath completed on 4/5. PMHx: CAD, NICM, CHF, HTN, OA, obesity ? ?  ?PT Comments  ? ? Pt admitted with above diagnosis. Pt was able to ambulate to sink but continues with very flexed trunk and needs mod assist once he gets there to control descent into chair as he has difficulty with turns.  Pt needs breaks and continues to be limited by fatigue with activity.  Pt currently with functional limitations due to balance and endurance deficits. Pt will benefit from skilled PT to increase their independence and safety with mobility to allow discharge to the venue listed below.      ?Recommendations for follow up therapy are one component of a multi-disciplinary discharge planning process, led by the attending physician.  Recommendations may be updated based on patient status, additional functional criteria and insurance authorization. ? ?Follow Up Recommendations ? Acute inpatient rehab (3hours/day) ?  ?  ?Assistance Recommended at Discharge Frequent or constant Supervision/Assistance  ?Patient can return home with the following A little help with bathing/dressing/bathroom;Assistance with cooking/housework;Assist for transportation;Help with stairs or ramp for entrance;A lot of help with walking and/or transfers ?  ?Equipment Recommendations ? None recommended by PT  ?  ?Recommendations for Other Services   ? ? ?  ?Precautions / Restrictions Precautions ?Precautions: Fall ?Precaution Comments: L radial heart cath on 4/5 ?Restrictions ?Weight Bearing Restrictions: No  ?  ? ?Mobility ? Bed Mobility ?Overal bed mobility: Needs Assistance ?Bed Mobility: Supine to Sit ?  ?  ?Supine to sit: Min guard, HOB elevated ?  ?  ?General bed mobility  comments: increased time to perform with verbal cues ?  ? ?Transfers ?Overall transfer level: Needs assistance ?Equipment used: Rolling Klipfel (2 wheels) ?Transfers: Sit to/from Stand, Bed to chair/wheelchair/BSC ?Sit to Stand: Min assist, +2 physical assistance, Mod assist ?  ?Step pivot transfers: Mod assist ?  ?  ?  ?General transfer comment: patient was able to stand from EOB, elevated, with min assist but required min assist +2 from recliner for first stand and mod assist +2 on second stand ?  ? ?Ambulation/Gait ?Ambulation/Gait assistance: Min assist, +2 safety/equipment, Mod assist ?Gait Distance (Feet): 15 Feet ?Assistive device: Rolling Human (2 wheels) ?Gait Pattern/deviations: Trunk flexed, Step-through pattern, Decreased stride length, Knee flexed in stance - right, Knee flexed in stance - left, Knees buckling, Decreased dorsiflexion - left ?Gait velocity: reduced ?Gait velocity interpretation: <1.31 ft/sec, indicative of household ambulator ?  ?General Gait Details: slow, very flexed posutre despite cues. shuffling steps without heel-toe pattern, fatigues quickly and progressively flexes trunk. minA to steady, +2 for chair follow; incr difficulty with turns. Assist to control descent into recliner each time as pt fatigued easily.  Stood 3 x to sink. Was going to try to wash face however PT had to clean bottom and pt coulsnt let go of RW in standing to wash face. ? ? ?Stairs ?  ?  ?  ?  ?  ? ? ?Wheelchair Mobility ?  ? ?Modified Rankin (Stroke Patients Only) ?  ? ? ?  ?Balance Overall balance assessment: Needs assistance ?Sitting-balance support: Feet supported, No upper extremity supported ?Sitting balance-[name] Scale: Good ?  ?Postural control: Posterior lean ?Standing balance support: Bilateral upper extremity supported,  During functional activity ?Standing balance-[name] Scale: Poor ?Standing balance comment: reliant on [place] or sink for standing balance ?  ?  ?  ?  ?  ?  ?  ?  ?  ?  ?  ?  ? ?   ?Cognition Arousal/Alertness: Awake/alert ?Behavior During Therapy: Flat affect ?Overall Cognitive Status: Impaired/Different from baseline ?Area of Impairment: Attention, Following commands, Safety/judgement, Problem solving, Memory ?  ?  ?  ?  ?  ?  ?  ?  ?  ?Current Attention Level: Focused ?Memory: Decreased recall of precautions, Decreased short-term memory ?Following Commands: Follows one step commands inconsistently, Follows one step commands with increased time ?Safety/Judgement: Decreased awareness of safety, Decreased awareness of deficits ?  ?Problem Solving: Slow processing, Decreased initiation, Difficulty sequencing, Requires verbal cues ?General Comments: slow processing with commands ?  ?  ? ?  ?Exercises General Exercises - Lower Extremity ?Ankle Circles/Pumps: AROM, Strengthening, Both, 10 reps, Supine ?Long Arc Quad: AROM, Both, Seated, 10 reps ?Hip Flexion/Marching: AROM, Both, 10 reps, Seated ? ?  ?General Comments General comments (skin integrity, edema, etc.): VSS on RA ?  ?  ? ?Pertinent Vitals/Pain Pain Assessment ?Pain Assessment: Faces ?Faces Pain Scale: Hurts a little bit ?Pain Location: grimacing during peri area cleaning ?Pain Descriptors / Indicators: Discomfort, Grimacing ?Pain Intervention(s): Limited activity within patient's tolerance, Monitored during session, Repositioned  ? ? ?Home Living   ?  ?  ?  ?  ?  ?  ?  ?  ?  ?   ?  ?Prior Function    ?  ?  ?   ? ?PT Goals (current goals can now be found in the care plan section) Progress towards PT goals: Progressing toward goals ? ?  ?Frequency ? ? ? Min 3X/week ? ? ? ?  ?PT Plan Current plan remains appropriate  ? ? ?Co-evaluation PT/OT/SLP Co-Evaluation/Treatment: Yes ?Reason for Co-Treatment: Complexity of the patient's impairments (multi-system involvement);For patient/therapist safety ?PT goals addressed during session: Mobility/safety with mobility ?OT goals addressed during session: ADL's and self-care ?  ? ?  ?AM-PAC PT "6  Clicks" Mobility   ?Outcome Measure ? Help needed turning from your back to your side while in a flat bed without using bedrails?: A Little ?Help needed moving from lying on your back to sitting on the side of a flat bed without using bedrails?: A Little ?Help needed moving to and from a bed to a chair (including a wheelchair)?: A Little ?Help needed standing up from a chair using your arms (e.g., wheelchair or bedside chair)?: A Lot ?Help needed to walk in hospital room?: Total ?Help needed climbing 3-5 steps with a railing? : A Lot ?6 Click Score: 14 ? ?  ?End of Session Equipment Utilized During Treatment: Gait belt ?Activity Tolerance: Patient tolerated treatment well;Patient limited by fatigue ?Patient left: with call bell/phone within reach;in chair;with chair alarm set ?Nurse Communication: Mobility status ?PT Visit Diagnosis: Muscle weakness (generalized) (M62.81);Unsteadiness on feet (R26.81);Other abnormalities of gait and mobility (R26.89);Difficulty in walking, not elsewhere classified (R26.2) ?  ? ? ?Time: [id number] ?PT Time Calculation (min) (ACUTE ONLY): 27 min ? ?Charges:  $Gait Training: 8-22 mins          ?          ? ?Haygen Zebrowski M,PT ?Acute Rehab Services ?[phone] ?[phone] (pager)  ? ? ?Alyx Mcguirk F Gasper Hopes ?06/01/2021, 1:45 PM ? ?

## 2021-06-01 NOTE — Progress Notes (Signed)
?  Mobility Specialist Criteria Algorithm Info. ? ? ? 06/01/21 1445  ?Mobility  ?Activity Dangled on edge of bed ?(LE exercises. [place] SABS)  ?Range of Motion/Exercises Active;All extremities  ?Level of Assistance Minimal assist, patient does 75% or more  ?Assistive Device None  ?Activity Response Tolerated well  ? ?Patient performed x2 LE exercises. Declined standing at the bedside or further exercises stating he was tired. Was left with all needs met, call bell in reach. ? ?06/01/2021 ?4:29 PM ? ?[place] Ruqayya Ventress, CMS, BS EXP ?Acute Rehabilitation Services  ?[facility]:[phone] ?Office: [phone] ? ?

## 2021-06-01 NOTE — Progress Notes (Signed)
Occupational Therapy Treatment ?Patient Details ?Name: Matthew [name] ?MRN: [id number] ?DOB: 29-Sep-1940 ?Today's Date: 06/01/2021 ? ? ?History of present illness 81 y.o. male adm 3/31 with SOB with RLL PE treated with Heparin> Eliquis. Admission 3/25-28/2023 with AMS and UTI. L radial heart cath completed on 4/5. PMHx: CAD, NICM, CHF, HTN, OA, obesity ?  ?OT comments ? Patient received in bed and agreeable to PT/OT session. Patient able to get to EOB with increased time and verbal cues and was able to stand with min assist from raised bed and ambulated to sink and required seated rest break before attempting grooming task. Patient required min assist +2 to stand from recliner on first stand and mod assist +2 on second stand due to fatigue. Patient was able to perform light grooming standing at sink with assistance for turning on water and required assistance to clean back peri area while standing. Acute OT to continue to follow.   ? ?Recommendations for follow up therapy are one component of a multi-disciplinary discharge planning process, led by the attending physician.  Recommendations may be updated based on patient status, additional functional criteria and insurance authorization. ?   ?Follow Up Recommendations ? Acute inpatient rehab (3hours/day)  ?  ?Assistance Recommended at Discharge Frequent or constant Supervision/Assistance  ?Patient can return home with the following ? A little help with bathing/dressing/bathroom;Assistance with cooking/housework;Direct supervision/assist for financial management;Direct supervision/assist for medications management;Assist for transportation;A lot of help with walking and/or transfers ?  ?Equipment Recommendations ? None recommended by OT  ?  ?Recommendations for Other Services   ? ?  ?Precautions / Restrictions Precautions ?Precautions: Fall ?Precaution Comments: L radial heart cath on 4/5 ?Restrictions ?Weight Bearing Restrictions: No  ? ? ?  ? ?Mobility Bed  Mobility ?Overal bed mobility: Needs Assistance ?Bed Mobility: Supine to Sit ?  ?  ?Supine to sit: Min guard, HOB elevated ?  ?  ?General bed mobility comments: increased time to perform with verbal cues ?  ? ?Transfers ?Overall transfer level: Needs assistance ?Equipment used: Rolling Fons (2 wheels) ?Transfers: Sit to/from Stand, Bed to chair/wheelchair/BSC ?Sit to Stand: Min assist, +2 physical assistance ?  ?  ?Step pivot transfers: Mod assist ?  ?  ?General transfer comment: patient was able to stand from EOB, elevated, with min assist but required min assist +2 from recliner for first stand and mod assist +2 on second stand ?  ?  ?Balance Overall balance assessment: Needs assistance ?Sitting-balance support: Feet supported ?Sitting balance-[name] Scale: Good ?  ?Postural control: Posterior lean ?Standing balance support: Bilateral upper extremity supported, During functional activity ?Standing balance-[name] Scale: Poor ?Standing balance comment: reliant on [place] or sink for standing balance ?  ?  ?  ?  ?  ?  ?  ?  ?  ?  ?  ?   ? ?ADL either performed or assessed with clinical judgement  ? ?ADL Overall ADL's : Needs assistance/impaired ?  ?  ?Grooming: Wash/dry hands;Wash/dry face;Minimal assistance;Standing ?Grooming Details (indicate cue type and reason): required assistance to turn on water and assistance with balance ?  ?  ?Lower Body Bathing: Maximal assistance;Sit to/from stand ?Lower Body Bathing Details (indicate cue type and reason): max assist to for cleaning bottom while standing ?  ?  ?  ?  ?  ?  ?  ?  ?  ?  ?  ?General ADL Comments: Patient stood at sink x3 for grooming and to clean peri area ?  ? ?Extremity/Trunk Assessment   ?  ?  ?  ?  ?  ? ?  Vision   ?  ?  ?Perception   ?  ?Praxis   ?  ? ?Cognition Arousal/Alertness: Awake/alert ?Behavior During Therapy: Flat affect ?Overall Cognitive Status: Impaired/Different from baseline ?Area of Impairment: Attention, Following commands, Safety/judgement,  Problem solving, Memory ?  ?  ?  ?  ?  ?  ?  ?  ?  ?Current Attention Level: Focused ?Memory: Decreased recall of precautions, Decreased short-term memory ?Following Commands: Follows one step commands inconsistently, Follows one step commands with increased time ?Safety/Judgement: Decreased awareness of safety, Decreased awareness of deficits ?  ?Problem Solving: Slow processing, Decreased initiation, Difficulty sequencing, Requires verbal cues ?General Comments: slow processing with commands ?  ?  ?   ?Exercises   ? ?  ?Shoulder Instructions   ? ? ?  ?General Comments    ? ? ?Pertinent Vitals/ Pain       Pain Assessment ?Pain Assessment: Faces ?Faces Pain Scale: Hurts a little bit ?Pain Location: grimacing during peri area cleaning ?Pain Descriptors / Indicators: Discomfort, Grimacing ?Pain Intervention(s): Monitored during session, Repositioned ? ?Home Living   ?  ?  ?  ?  ?  ?  ?  ?  ?  ?  ?  ?  ?  ?  ?  ?  ?  ?  ? ?  ?Prior Functioning/Environment    ?  ?  ?  ?   ? ?Frequency ? Min 2X/week  ? ? ? ? ?  ?Progress Toward Goals ? ?OT Goals(current goals can now be found in the care plan section) ? Progress towards OT goals: Progressing toward goals ? ?Acute Rehab OT Goals ?OT Goal Formulation: Patient unable to participate in goal setting ?Time For Goal Achievement: 06/06/21 ?Potential to Achieve Goals: Fair ?ADL Goals ?Pt Will Perform Eating: with set-up;sitting ?Pt Will Perform Grooming: with set-up;with supervision;sitting;standing ?Pt Will Perform Lower Body Bathing: with supervision;sit to/from stand ?Pt Will Perform Upper Body Dressing: with set-up;sitting ?Pt Will Perform Lower Body Dressing: with supervision;sit to/from stand ?Pt Will Transfer to Toilet: with supervision;ambulating ?Pt Will Perform Toileting - Clothing Manipulation and hygiene: with modified independence;sit to/from stand ?Additional ADL Goal #1: Pt will verbalie 3 strateiges to manage heart failure to reduce risk of readmission  ?Plan  Discharge plan remains appropriate   ? ?Co-evaluation ? ? ? PT/OT/SLP Co-Evaluation/Treatment: Yes ?Reason for Co-Treatment: For patient/therapist safety;To address functional/ADL transfers ?  ?OT goals addressed during session: ADL's and self-care ?  ? ?  ?AM-PAC OT "6 Clicks" Daily Activity     ?Outcome Measure ? ? Help from another person eating meals?: A Little ?Help from another person taking care of personal grooming?: A Little ?Help from another person toileting, which includes using toliet, bedpan, or urinal?: A Lot ?Help from another person bathing (including washing, rinsing, drying)?: A Lot ?Help from another person to put on and taking off regular upper body clothing?: A Little ?Help from another person to put on and taking off regular lower body clothing?: A Lot ?6 Click Score: 15 ? ?  ?End of Session Equipment Utilized During Treatment: Gait belt;Rolling Laura (2 wheels) ? ?OT Visit Diagnosis: Unsteadiness on feet (R26.81);Muscle weakness (generalized) (M62.81);Other symptoms and signs involving cognitive function ?  ?Activity Tolerance Patient tolerated treatment well ?  ?Patient Left in chair;with call bell/phone within reach;with chair alarm set ?  ?Nurse Communication Mobility status;Other (comment) (red area on bottom) ?  ? ?   ? ?Time: 8657-8469 ?OT Time Calculation (min): 26 min ? ?Charges:  OT General Charges ?$OT Visit: 1 Visit ?OT Treatments ?$Self Care/Home Management : 8-22 mins ? ?Alfonse Flavors, OTA ?Acute Rehabilitation Services  ?Pager [phone] ?Office [phone] ? ? ?Kalasia Crafton Jeannett Senior ?06/01/2021, 10:56 AM ?

## 2021-06-01 NOTE — Progress Notes (Addendum)
ANTICOAGULATION CONSULT NOTE ? ?Pharmacy Consult for apixaban > heparin ?Indication: pulmonary embolus ? ?No Active Allergies ? ? ?Patient Measurements: ?Height: 6\' 7"  (200.7 cm) ?Weight: 83.9 kg (184 lb 14.4 oz) ?IBW/kg (Calculated) : 93.7 ?Heparin Dosing Weight: 90kg ? ?Vital Signs: ?Temp: 97.7 ?F (36.5 ?C) (04/19 [id number]) ?Temp Source: Oral (04/19 [id number]) ?BP: 116/76 (04/19 [id number]) ?Pulse Rate: 82 (04/19 0526) ? ?Labs: ?Recent Labs  ?  05/30/21 ?0505 05/31/21 ?[id number] 06/01/21 ?0512  ?HGB 12.3*  --   --   ?HCT 37.8*  --   --   ?PLT 197  --   --   ?CREATININE 2.11* 2.16* 2.44*  ? ? ? ?Estimated Creatinine Clearance: 28.7 mL/min (A) (by C-G formula based on SCr of 2.44 mg/dL (H)). ? ? ?Assessment: ?54 yoM admitted with new PE. No anticoagulation PTA. Pt started on heparin infusion and ultimately apixaban will transition back to heparin in anticipation of mitraclip tomorrow 4/20.  ? ?D/w structural team, will give apixaban this morning then hold further doses and start heparin bridge tonight. Check labs in am ? ?Goal of Therapy:  ?Heparin level 0.3-0.7 units/ml ?aPTT 66-102 seconds ?Monitor platelets by anticoagulation protocol: Yes ?  ?Plan:  ?Apixaban 5mg  x1 this am ?Start heparin at 1700 units/hr tonight ?Labs in am ? ?Erin Hearing PharmD., BCPS ?Clinical Pharmacist ?06/01/2021 9:16 AM ? ?

## 2021-06-01 NOTE — Progress Notes (Signed)
Initial Nutrition Assessment ? ?DOCUMENTATION CODES:  ?Severe malnutrition in context of chronic illness ? ?INTERVENTION:  ?Adjust diet to DYS 3 to promote oral intake ?Ensure Enlive po BID, each supplement provides 350 kcal and 20 grams of protein. ?MVI with minerals daily ?Magic cup TID with meals, each supplement provides 290 kcal and 9 grams of protein ? ?NUTRITION DIAGNOSIS:  ?Severe Malnutrition related to chronic illness (CHF) as evidenced by severe fat depletion, severe muscle depletion. ? ?GOAL:  ?Patient will meet greater than or equal to 90% of their needs ? ?MONITOR:  ?PO intake, Supplement acceptance, Weight trends, I & O's ? ?REASON FOR ASSESSMENT:  ?LOS ?  ? ?ASSESSMENT:  ?81 y.o. male with history of CAD, CHF, HTN, HLD, CKD3, and lactose intolerance presented to ED with SOB and chest pain. Found to have acute PE. ? ?4/5 - TEE, right and left heart cath and coronary angiography  ? ?Pt resting in bed at the time of assessment, watching TV. Pt reports that his appetite has been pretty good. States that at baseline he eats 3x/d and sometimes drinks ensure. Prefers strawberry flavors.  ? ?Noted weight loss in the last few months, and significant weight loss this admission. However, pt with hx of CHF and changes could be at least partially related to fluid. Pt is -8L this admission. However, on exam, severe muscle and fat deficits noted. ? ?Will add in nutrition supplement to augment intake. Pt reports some difficulty shewing, will adjust diet to DYS 3 for easier meal consumption.   ? ?Average Meal Intake: ?4/3-4/19: 71% intake x 7 recorded meals ? ?Nutritionally Relevant Medications: ?Scheduled Meds: ? atorvastatin  10 mg Oral Daily  ? docusate sodium  200 mg Oral Daily  ? magnesium oxide  400 mg Oral Daily  ? pantoprazole  40 mg Oral Daily  ? polyethylene glycol  17 g Oral BID  ? senna  2 tablet Oral Daily  ? sorbitol  60 mL Oral Once  ? ?PRN Meds: ondansetron  ? ?Labs Reviewed: ?Sodium 134 ?BUN 42,  creatinine 2.44 ? ?NUTRITION - FOCUSED PHYSICAL EXAM: ?Flowsheet Row Most Recent Value  ?Orbital Region Severe depletion  ?Upper Arm Region Severe depletion  ?Thoracic and Lumbar Region Moderate depletion  ?Buccal Region Severe depletion  ?Temple Region Severe depletion  ?Clavicle Bone Region Moderate depletion  ?Clavicle and Acromion Bone Region Moderate depletion  ?Scapular Bone Region Moderate depletion  ?Dorsal Hand Severe depletion  ?Patellar Region Severe depletion  ?Anterior Thigh Region Severe depletion  ?Posterior Calf Region Severe depletion  ?Edema (RD Assessment) None  ?Hair Reviewed  ?Eyes Reviewed  ?Mouth Reviewed  ?Skin Reviewed  ?Nails Reviewed  ? ?Diet Order:   ?Diet Order   ? ?       ?  DIET DYS 3 Room service appropriate? Yes with Assist; Fluid consistency: Thin  Diet effective now       ?  ? ?  ?  ? ?  ? ? ?EDUCATION NEEDS:  ?No education needs have been identified at this time ? ?Skin:  Skin Assessment: Reviewed RN Assessment (Per WOC: right distal lateral leg 1 cm x 1 cm round, 0.5 cm round) ? ?Last BM:  4/15 ? ?Height:  ?Ht Readings from Last 1 Encounters:  ?05/14/21 6\' 7"  (2.007 m)  ? ? ?Weight:  ?Wt Readings from Last 1 Encounters:  ?05/31/21 83.9 kg  ? ? ?Ideal Body Weight:  100 kg ? ?BMI:  Body mass index is 20.83 kg/m?. ? ?Estimated  Nutritional Needs:  ?Kcal:  2200-2400 kcal/d ?Protein:  110-125 g/d ?Fluid:  2.2-2.5 L/d ? ? ?Ranell Patrick, RD, LDN ?Clinical Dietitian ?RD pager # available in [place]  ?After hours/weekend pager # available in [place] ?

## 2021-06-02 ENCOUNTER — Inpatient Hospital Stay (HOSPITAL_COMMUNITY): Payer: Medicare Other

## 2021-06-02 ENCOUNTER — Inpatient Hospital Stay (HOSPITAL_COMMUNITY): Payer: Medicare Other | Admitting: Certified Registered"

## 2021-06-02 ENCOUNTER — Encounter (HOSPITAL_COMMUNITY)
Admission: EM | Disposition: A | Discharge status: Rehabilitation Facility | Payer: Self-pay | Source: Home / Self Care | Attending: Internal Medicine

## 2021-06-02 ENCOUNTER — Encounter (HOSPITAL_COMMUNITY): Payer: Self-pay | Admitting: Internal Medicine

## 2021-06-02 DIAGNOSIS — E785 Hyperlipidemia, unspecified: Secondary | ICD-10-CM | POA: Diagnosis not present

## 2021-06-02 DIAGNOSIS — I2699 Other pulmonary embolism without acute cor pulmonale: Secondary | ICD-10-CM | POA: Diagnosis not present

## 2021-06-02 DIAGNOSIS — I340 Nonrheumatic mitral (valve) insufficiency: Secondary | ICD-10-CM

## 2021-06-02 DIAGNOSIS — Z9889 Other specified postprocedural states: Secondary | ICD-10-CM

## 2021-06-02 DIAGNOSIS — Z006 Encounter for examination for normal comparison and control in clinical research program: Secondary | ICD-10-CM

## 2021-06-02 DIAGNOSIS — Z95818 Presence of other cardiac implants and grafts: Secondary | ICD-10-CM

## 2021-06-02 DIAGNOSIS — I2601 Septic pulmonary embolism with acute cor pulmonale: Secondary | ICD-10-CM | POA: Diagnosis not present

## 2021-06-02 DIAGNOSIS — I5043 Acute on chronic combined systolic (congestive) and diastolic (congestive) heart failure: Secondary | ICD-10-CM

## 2021-06-02 DIAGNOSIS — I2510 Atherosclerotic heart disease of native coronary artery without angina pectoris: Secondary | ICD-10-CM

## 2021-06-02 DIAGNOSIS — I5023 Acute on chronic systolic (congestive) heart failure: Secondary | ICD-10-CM | POA: Diagnosis not present

## 2021-06-02 DIAGNOSIS — N179 Acute kidney failure, unspecified: Secondary | ICD-10-CM | POA: Diagnosis not present

## 2021-06-02 DIAGNOSIS — N1832 Chronic kidney disease, stage 3b: Secondary | ICD-10-CM

## 2021-06-02 DIAGNOSIS — I130 Hypertensive heart and chronic kidney disease with heart failure and stage 1 through stage 4 chronic kidney disease, or unspecified chronic kidney disease: Secondary | ICD-10-CM

## 2021-06-02 DIAGNOSIS — E43 Unspecified severe protein-calorie malnutrition: Secondary | ICD-10-CM | POA: Insufficient documentation

## 2021-06-02 HISTORY — DX: Other specified postprocedural states: Z95.818

## 2021-06-02 HISTORY — PX: MITRAL VALVE REPAIR: CATH118311

## 2021-06-02 HISTORY — PX: TEE WITHOUT CARDIOVERSION: SHX5443

## 2021-06-02 HISTORY — DX: Presence of other cardiac implants and grafts: Z98.890

## 2021-06-02 LAB — BASIC METABOLIC PANEL
Anion gap: 8 (ref 5–15)
BUN: 39 mg/dL — ABNORMAL HIGH (ref 8–23)
CO2: 22 mmol/L (ref 22–32)
Calcium: 8.2 mg/dL — ABNORMAL LOW (ref 8.9–10.3)
Chloride: 101 mmol/L (ref 98–111)
Creatinine, Ser: 2.03 mg/dL — ABNORMAL HIGH (ref 0.61–1.24)
GFR, Estimated: 33 mL/min — ABNORMAL LOW (ref >60)
Glucose, Bld: 92 mg/dL (ref 70–99)
Potassium: 4.1 mmol/L (ref 3.5–5.1)
Sodium: 131 mmol/L — ABNORMAL LOW (ref 135–145)

## 2021-06-02 LAB — CBC
HCT: 34.4 % — ABNORMAL LOW (ref 39.0–52.0)
Hemoglobin: 11 g/dL — ABNORMAL LOW (ref 13.0–17.0)
MCH: 26 pg (ref 26.0–34.0)
MCHC: 32 g/dL (ref 30.0–36.0)
MCV: 81.3 fL (ref 80.0–100.0)
Platelets: 167 10*3/uL (ref 150–400)
RBC: 4.23 MIL/uL (ref 4.22–5.81)
RDW: 20.9 % — ABNORMAL HIGH (ref 11.5–15.5)
WBC: 6.8 10*3/uL (ref 4.0–10.5)
nRBC: 0 % (ref 0.0–0.2)

## 2021-06-02 LAB — COOXEMETRY PANEL
Carboxyhemoglobin: 2.5 % — ABNORMAL HIGH (ref 0.5–1.5)
Carboxyhemoglobin: 1.6 % — ABNORMAL HIGH (ref 0.5–1.5)
Methemoglobin: <0.7 % (ref 0.0–1.5)
Methemoglobin: 0.7 % (ref 0.0–1.5)
O2 Saturation: 90.9 %
O2 Saturation: 79.9 %
Total hemoglobin: 12.3 g/dL (ref 12.0–16.0)
Total hemoglobin: 11.9 g/dL — ABNORMAL LOW (ref 12.0–16.0)

## 2021-06-02 LAB — POCT I-STAT 7, (LYTES, BLD GAS, ICA,H+H)
Acid-Base Excess: 2 mmol/L (ref 0.0–2.0)
Acid-base deficit: 2 mmol/L (ref 0.0–2.0)
Bicarbonate: 26.9 mmol/L (ref 20.0–28.0)
Bicarbonate: 25 mmol/L (ref 20.0–28.0)
Calcium, Ion: 1.17 mmol/L (ref 1.15–1.40)
Calcium, Ion: 1.11 mmol/L — ABNORMAL LOW (ref 1.15–1.40)
HCT: 43 % (ref 39.0–52.0)
HCT: 42 % (ref 39.0–52.0)
Hemoglobin: 14.6 g/dL (ref 13.0–17.0)
Hemoglobin: 14.3 g/dL (ref 13.0–17.0)
O2 Saturation: 100 %
O2 Saturation: 100 %
Patient temperature: 97.6
Potassium: 5.3 mmol/L — ABNORMAL HIGH (ref 3.5–5.1)
Potassium: 5.3 mmol/L — ABNORMAL HIGH (ref 3.5–5.1)
Sodium: 135 mmol/L (ref 135–145)
Sodium: 134 mmol/L — ABNORMAL LOW (ref 135–145)
TCO2: 29 mmol/L (ref 22–32)
TCO2: 26 mmol/L (ref 22–32)
pCO2 arterial: 60.1 mmHg — ABNORMAL HIGH (ref 32–48)
pCO2 arterial: 31.8 mmHg — ABNORMAL LOW (ref 32–48)
pH, Arterial: 7.258 — ABNORMAL LOW (ref 7.35–7.45)
pH, Arterial: 7.502 — ABNORMAL HIGH (ref 7.35–7.45)
pO2, Arterial: 202 mmHg — ABNORMAL HIGH (ref 83–108)
pO2, Arterial: 464 mmHg — ABNORMAL HIGH (ref 83–108)

## 2021-06-02 LAB — POCT ACTIVATED CLOTTING TIME
Activated Clotting Time: 245 seconds
Activated Clotting Time: 299 seconds
Activated Clotting Time: 275 seconds
Activated Clotting Time: 299 seconds

## 2021-06-02 LAB — ECHO TEE
Height: 79 in
MV M vel: 5.42 m/s
MV Peak grad: 117.5 mmHg
Radius: 0.7 cm
Weight: 2927.71 oz

## 2021-06-02 LAB — APTT
aPTT: >200 seconds (ref 24–36)
aPTT: >200 seconds (ref 24–36)

## 2021-06-02 LAB — SURGICAL PCR SCREEN
MRSA, PCR: NEGATIVE
Staphylococcus aureus: POSITIVE — AB

## 2021-06-02 LAB — HEPARIN LEVEL (UNFRACTIONATED): Heparin Unfractionated: >1.1 IU/mL — ABNORMAL HIGH (ref 0.30–0.70)

## 2021-06-02 LAB — GLUCOSE, CAPILLARY: Glucose-Capillary: 103 mg/dL — ABNORMAL HIGH (ref 70–99)

## 2021-06-02 IMAGING — DX DG CHEST 1V PORT
1 series · 1 of 1 positions shown · non-contrast
Comparison: [DATE]

CLINICAL DATA: Short of breath, central venous catheter placement,
pulmonary embolus

EXAM:
PORTABLE CHEST 1 VIEW

[chest]
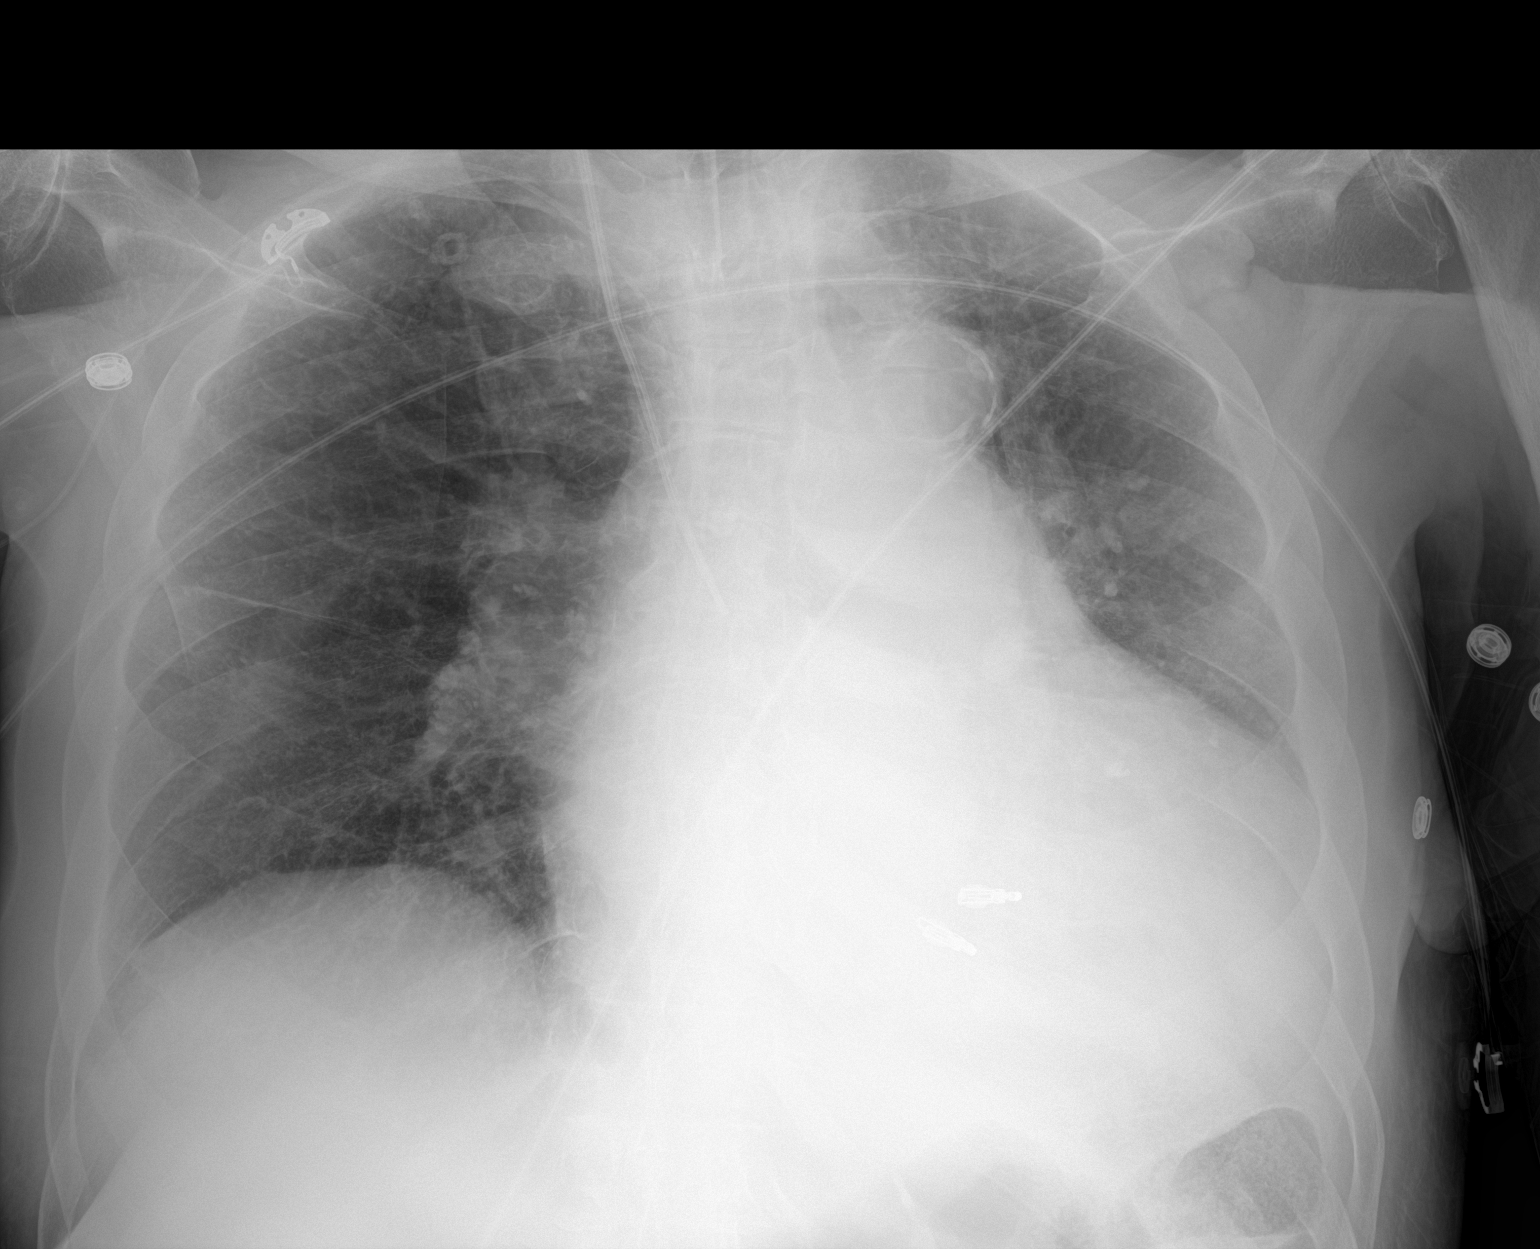

[1 of 1 positions shown; findings below may reference images not displayed]

FINDINGS: Single frontal view of the chest demonstrates endotracheal tube
overlying tracheal air column tip just below thoracic inlet. Right
internal jugular catheter tip projects over the superior vena cava.
Stable right-sided PICC. Two radiodensities are seen projecting over
the inferior heart border, likely surgical clips and new since prior
exam. Cardiac silhouette is enlarged. Increased central vascular
congestion. Increased density at the left lung base consistent with
consolidation and likely small effusion. No pneumothorax.
IMPRESSION: 1. Support devices as above.
2. Enlarged cardiac silhouette.
3. Central vascular congestion, with likely left basilar atelectasis
and small effusion.

## 2021-06-02 SURGERY — MITRAL VALVE REPAIR
Anesthesia: General

## 2021-06-02 MED ORDER — APIXABAN 5 MG PO TABS
5.0000 mg | ORAL_TABLET | Freq: Two times a day (BID) | ORAL | Status: DC
  Administered 2021-06-02: 5 mg via ORAL
  Filled 2021-06-02: qty 1

## 2021-06-02 MED ORDER — ONDANSETRON HCL 4 MG/2ML IJ SOLN
INTRAMUSCULAR | Status: DC | PRN
  Administered 2021-06-02: 4 mg via INTRAVENOUS

## 2021-06-02 MED ORDER — MIDAZOLAM-SODIUM CHLORIDE 100-0.9 MG/100ML-% IV SOLN
0.5000 mg/h | INTRAVENOUS | Status: DC
  Administered 2021-06-02 (×2): 0.5 mg/h via INTRAVENOUS

## 2021-06-02 MED ORDER — MIDODRINE HCL 5 MG PO TABS
10.0000 mg | ORAL_TABLET | Freq: Three times a day (TID) | ORAL | Status: DC
  Administered 2021-06-03: 10 mg
  Filled 2021-06-02: qty 2

## 2021-06-02 MED ORDER — PHENYLEPHRINE HCL-NACL 20-0.9 MG/250ML-% IV SOLN
INTRAVENOUS | Status: DC | PRN
  Administered 2021-06-02: 25 ug/min via INTRAVENOUS

## 2021-06-02 MED ORDER — SENNA 8.6 MG PO TABS: 2.0000 | ORAL_TABLET | Freq: Every day | ORAL | Status: DC

## 2021-06-02 MED ORDER — HEPARIN (PORCINE) IN NACL 2000-0.9 UNIT/L-% IV SOLN
INTRAVENOUS | Status: DC | PRN
  Administered 2021-06-02 (×3): 1000 mL

## 2021-06-02 MED ORDER — SACUBITRIL-VALSARTAN 24-26 MG PO TABS: 1.0000 | ORAL_TABLET | Freq: Two times a day (BID) | ORAL | Status: DC

## 2021-06-02 MED ORDER — CHLORHEXIDINE GLUCONATE 0.12% ORAL RINSE (MEDLINE KIT)
15.0000 mL | Freq: Two times a day (BID) | OROMUCOSAL | Status: DC
  Administered 2021-06-02 – 2021-06-07 (×9): 15 mL via OROMUCOSAL

## 2021-06-02 MED ORDER — HEPARIN (PORCINE) IN NACL 1000-0.9 UT/500ML-% IV SOLN
INTRAVENOUS | Status: DC | PRN
  Administered 2021-06-02 (×2): 500 mL

## 2021-06-02 MED ORDER — ACETAMINOPHEN 500 MG PO TABS: 500.0000 mg | ORAL_TABLET | Freq: Four times a day (QID) | ORAL | Status: DC | PRN

## 2021-06-02 MED ORDER — MEXILETINE HCL 150 MG PO CAPS
300.0000 mg | ORAL_CAPSULE | Freq: Two times a day (BID) | ORAL | Status: DC
  Administered 2021-06-02 – 2021-06-03 (×2): 300 mg
  Filled 2021-06-02 (×3): qty 2

## 2021-06-02 MED ORDER — HEPARIN (PORCINE) IN NACL 2000-0.9 UNIT/L-% IV SOLN
INTRAVENOUS | Status: AC
  Filled 2021-06-02: qty 3000

## 2021-06-02 MED ORDER — SODIUM CHLORIDE 0.9 % IV SOLN
250.0000 mL | INTRAVENOUS | Status: DC | PRN
Start: 2021-06-02 — End: 2021-06-08
  Administered 2021-06-02: 250 mL via INTRAVENOUS

## 2021-06-02 MED ORDER — ACETAMINOPHEN 325 MG PO TABS: 650.0000 mg | ORAL_TABLET | ORAL | Status: DC | PRN

## 2021-06-02 MED ORDER — ROCURONIUM BROMIDE 10 MG/ML (PF) SYRINGE
PREFILLED_SYRINGE | INTRAVENOUS | Status: DC | PRN
  Administered 2021-06-02: 30 mg via INTRAVENOUS
  Administered 2021-06-02: 20 mg via INTRAVENOUS
  Administered 2021-06-02: 50 mg via INTRAVENOUS
  Administered 2021-06-02: 80 mg via INTRAVENOUS

## 2021-06-02 MED ORDER — METOPROLOL SUCCINATE ER 25 MG PO TB24
37.5000 mg | ORAL_TABLET | Freq: Every day | ORAL | Status: DC
  Administered 2021-06-02: 37.5 mg via ORAL
  Filled 2021-06-02: qty 2

## 2021-06-02 MED ORDER — APIXABAN 5 MG PO TABS
5.0000 mg | ORAL_TABLET | Freq: Two times a day (BID) | ORAL | Status: DC
  Administered 2021-06-03: 5 mg
  Filled 2021-06-02: qty 1

## 2021-06-02 MED ORDER — PHENYLEPHRINE 80 MCG/ML (10ML) SYRINGE FOR IV PUSH (FOR BLOOD PRESSURE SUPPORT)
PREFILLED_SYRINGE | INTRAVENOUS | Status: DC | PRN
  Administered 2021-06-02: 80 ug via INTRAVENOUS

## 2021-06-02 MED ORDER — ATORVASTATIN CALCIUM 10 MG PO TABS
10.0000 mg | ORAL_TABLET | Freq: Every day | ORAL | Status: DC
  Administered 2021-06-03 – 2021-06-08 (×6): 10 mg
  Filled 2021-06-02 (×6): qty 1

## 2021-06-02 MED ORDER — ORAL CARE MOUTH RINSE
15.0000 mL | OROMUCOSAL | Status: DC
  Administered 2021-06-02 – 2021-06-03 (×5): 15 mL via OROMUCOSAL

## 2021-06-02 MED ORDER — HYDRALAZINE HCL 20 MG/ML IJ SOLN: 5.0000 mg | INTRAMUSCULAR | Status: DC | PRN

## 2021-06-02 MED ORDER — FUROSEMIDE 10 MG/ML IJ SOLN
40.0000 mg | Freq: Once | INTRAMUSCULAR | Status: AC
  Administered 2021-06-02: 40 mg via INTRAVENOUS

## 2021-06-02 MED ORDER — DOCUSATE SODIUM 50 MG/5ML PO LIQD
100.0000 mg | Freq: Two times a day (BID) | ORAL | Status: DC
  Administered 2021-06-02: 100 mg
  Filled 2021-06-02: qty 10

## 2021-06-02 MED ORDER — CHLORHEXIDINE GLUCONATE 4 % EX LIQD
4.0000 "application " | Freq: Once | CUTANEOUS | Status: DC
  Filled 2021-06-02: qty 60

## 2021-06-02 MED ORDER — DAPAGLIFLOZIN PROPANEDIOL 10 MG PO TABS
10.0000 mg | ORAL_TABLET | Freq: Every day | ORAL | Status: DC
  Filled 2021-06-02: qty 1

## 2021-06-02 MED ORDER — LABETALOL HCL 5 MG/ML IV SOLN: 10.0000 mg | INTRAVENOUS | Status: DC | PRN

## 2021-06-02 MED ORDER — SPIRONOLACTONE 25 MG PO TABS: 25.0000 mg | ORAL_TABLET | Freq: Every day | ORAL | Status: DC

## 2021-06-02 MED ORDER — CHLORHEXIDINE GLUCONATE 0.12 % MT SOLN
OROMUCOSAL | Status: AC
Start: 2021-06-02 — End: 2021-06-02
  Administered 2021-06-02: 15 mL via OROMUCOSAL
  Filled 2021-06-02: qty 15

## 2021-06-02 MED ORDER — TORSEMIDE 20 MG PO TABS
40.0000 mg | ORAL_TABLET | Freq: Every day | ORAL | Status: DC
  Administered 2021-06-03: 40 mg
  Filled 2021-06-02: qty 2

## 2021-06-02 MED ORDER — SUGAMMADEX SODIUM 200 MG/2ML IV SOLN
INTRAVENOUS | Status: DC | PRN
  Administered 2021-06-02: 400 mg via INTRAVENOUS

## 2021-06-02 MED ORDER — HEPARIN SODIUM (PORCINE) 1000 UNIT/ML IJ SOLN
INTRAMUSCULAR | Status: DC | PRN
  Administered 2021-06-02: 12000 [IU] via INTRAVENOUS
  Administered 2021-06-02: 3000 [IU] via INTRAVENOUS

## 2021-06-02 MED ORDER — FENTANYL 2500MCG IN NS 250ML (10MCG/ML) PREMIX INFUSION
0.0000 ug/h | INTRAVENOUS | Status: DC
  Administered 2021-06-02 (×2): 25 ug/h via INTRAVENOUS

## 2021-06-02 MED ORDER — POLYETHYLENE GLYCOL 3350 17 G PO PACK
17.0000 g | PACK | Freq: Every day | ORAL | Status: DC
  Administered 2021-06-02: 17 g
  Filled 2021-06-02: qty 1

## 2021-06-02 MED ORDER — SODIUM CHLORIDE 0.9 % IV SOLN: INTRAVENOUS | Status: DC

## 2021-06-02 MED ORDER — HEPARIN (PORCINE) IN NACL 1000-0.9 UT/500ML-% IV SOLN
INTRAVENOUS | Status: AC
  Filled 2021-06-02: qty 500

## 2021-06-02 MED ORDER — MAGNESIUM OXIDE -MG SUPPLEMENT 400 (240 MG) MG PO TABS
400.0000 mg | ORAL_TABLET | Freq: Every day | ORAL | Status: DC
  Administered 2021-06-03: 400 mg
  Filled 2021-06-02: qty 1

## 2021-06-02 MED ORDER — PANTOPRAZOLE 2 MG/ML SUSPENSION
40.0000 mg | Freq: Every day | ORAL | Status: DC
  Administered 2021-06-02: 40 mg
  Filled 2021-06-02: qty 20

## 2021-06-02 MED ORDER — FENTANYL CITRATE (PF) 250 MCG/5ML IJ SOLN
INTRAMUSCULAR | Status: DC | PRN
  Administered 2021-06-02: 50 ug via INTRAVENOUS

## 2021-06-02 MED ORDER — MIDAZOLAM-SODIUM CHLORIDE 100-0.9 MG/100ML-% IV SOLN
INTRAVENOUS | Status: AC
  Filled 2021-06-02: qty 100

## 2021-06-02 MED ORDER — FUROSEMIDE 20 MG PO TABS
20.0000 mg | ORAL_TABLET | Freq: Every day | ORAL | Status: DC
Start: 2021-06-02 — End: 2021-06-02

## 2021-06-02 MED ORDER — TRAMADOL HCL 50 MG PO TABS
50.0000 mg | ORAL_TABLET | Freq: Three times a day (TID) | ORAL | Status: DC | PRN
  Administered 2021-06-03: 50 mg via ORAL
  Filled 2021-06-02: qty 1

## 2021-06-02 MED ORDER — SODIUM CHLORIDE 0.9 % IV SOLN: INTRAVENOUS | Status: DC | PRN

## 2021-06-02 MED ORDER — FENTANYL 2500MCG IN NS 250ML (10MCG/ML) PREMIX INFUSION
25.0000 ug/h | INTRAVENOUS | Status: DC
  Administered 2021-06-02: 25 ug/h via INTRAVENOUS
  Filled 2021-06-02: qty 250

## 2021-06-02 MED ORDER — SODIUM CHLORIDE 0.9% FLUSH
3.0000 mL | Freq: Two times a day (BID) | INTRAVENOUS | Status: DC
  Administered 2021-06-02 – 2021-06-07 (×10): 3 mL via INTRAVENOUS

## 2021-06-02 MED ORDER — MUPIROCIN 2 % EX OINT
1.0000 "application " | TOPICAL_OINTMENT | Freq: Two times a day (BID) | CUTANEOUS | Status: AC
  Administered 2021-06-02 – 2021-06-07 (×10): 1 via NASAL
  Filled 2021-06-02 (×2): qty 22

## 2021-06-02 MED ORDER — FENTANYL 2500MCG IN NS 250ML (10MCG/ML) PREMIX INFUSION
INTRAVENOUS | Status: AC
  Filled 2021-06-02: qty 250

## 2021-06-02 MED ORDER — DEXMEDETOMIDINE HCL IN NACL 400 MCG/100ML IV SOLN
0.0000 ug/kg/h | INTRAVENOUS | Status: DC
  Administered 2021-06-02: 0.2 ug/kg/h via INTRAVENOUS
  Filled 2021-06-02: qty 100

## 2021-06-02 MED ORDER — DEXAMETHASONE SODIUM PHOSPHATE 10 MG/ML IJ SOLN
INTRAMUSCULAR | Status: DC | PRN
  Administered 2021-06-02: 5 mg via INTRAVENOUS

## 2021-06-02 MED ORDER — FUROSEMIDE 10 MG/ML IJ SOLN
INTRAMUSCULAR | Status: AC
  Filled 2021-06-02: qty 4

## 2021-06-02 MED ORDER — PANTOPRAZOLE 2 MG/ML SUSPENSION: 40.0000 mg | Freq: Every day | ORAL | Status: DC

## 2021-06-02 MED ORDER — FENTANYL BOLUS VIA INFUSION
25.0000 ug | INTRAVENOUS | Status: DC | PRN
  Filled 2021-06-02: qty 100

## 2021-06-02 MED ORDER — SODIUM CHLORIDE 0.9% FLUSH
10.0000 mL | Freq: Two times a day (BID) | INTRAVENOUS | Status: DC
  Administered 2021-06-02: 10 mL
  Administered 2021-06-03: 40 mL
  Administered 2021-06-03 – 2021-06-08 (×10): 10 mL

## 2021-06-02 MED ORDER — SACUBITRIL-VALSARTAN 24-26 MG PO TABS
1.0000 | ORAL_TABLET | Freq: Two times a day (BID) | ORAL | Status: DC
  Filled 2021-06-02: qty 1

## 2021-06-02 MED ORDER — SODIUM CHLORIDE 0.9% FLUSH: 10.0000 mL | INTRAVENOUS | Status: DC | PRN

## 2021-06-02 MED ORDER — LACTATED RINGERS IV SOLN: INTRAVENOUS | Status: DC

## 2021-06-02 MED ORDER — AMIODARONE HCL 200 MG PO TABS
400.0000 mg | ORAL_TABLET | Freq: Two times a day (BID) | ORAL | Status: DC
  Administered 2021-06-03: 400 mg
  Filled 2021-06-02: qty 2

## 2021-06-02 MED ORDER — PROPOFOL 10 MG/ML IV BOLUS
INTRAVENOUS | Status: DC | PRN
  Administered 2021-06-02: 40 mg via INTRAVENOUS
  Administered 2021-06-02: 30 mg via INTRAVENOUS
  Administered 2021-06-02: 20 mg via INTRAVENOUS

## 2021-06-02 MED ORDER — SUCCINYLCHOLINE CHLORIDE 200 MG/10ML IV SOSY
PREFILLED_SYRINGE | INTRAVENOUS | Status: DC | PRN
  Administered 2021-06-02: 120 mg via INTRAVENOUS

## 2021-06-02 MED ORDER — ONDANSETRON HCL 4 MG/2ML IJ SOLN: 4.0000 mg | Freq: Four times a day (QID) | INTRAMUSCULAR | Status: DC | PRN

## 2021-06-02 MED ORDER — SODIUM CHLORIDE 0.9% FLUSH
3.0000 mL | INTRAVENOUS | Status: DC | PRN
Start: 2021-06-02 — End: 2021-06-08

## 2021-06-02 MED ORDER — EPHEDRINE SULFATE-NACL 50-0.9 MG/10ML-% IV SOSY
PREFILLED_SYRINGE | INTRAVENOUS | Status: DC | PRN
  Administered 2021-06-02: 10 mg via INTRAVENOUS

## 2021-06-02 SURGICAL SUPPLY — 20 items
CATH MITRA STEERABLE GUIDE (CATHETERS) ×1 IMPLANT
CLIP MITRA G4 DELIVERY SYS XTW (Clip) ×2 IMPLANT
CLOSURE PERCLOSE PROSTYLE (VASCULAR PRODUCTS) ×2 IMPLANT
DRYSEAL FLEXSHEATH 24FR 33CM (SHEATH) ×1
GUIDEWIRE SAFE TJ AMPLATZ EXST (WIRE) ×1 IMPLANT
KIT DILATOR VASC 18G NDL (KITS) ×1 IMPLANT
KIT HEART LEFT (KITS) ×4 IMPLANT
KIT VERSACROSS LRG ACCESS (CATHETERS) ×1 IMPLANT
PACK CARDIAC CATHETERIZATION (CUSTOM PROCEDURE TRAY) ×2 IMPLANT
SHEATH DRYSEAL FLEX 24FR 33CM (SHEATH) IMPLANT
SHEATH PINNACLE 8F 10CM (SHEATH) ×1 IMPLANT
SHEATH PROBE COVER 6X72 (BAG) ×2 IMPLANT
SHIELD RADPAD SCOOP 12X17 (MISCELLANEOUS) ×1 IMPLANT
STOPCOCK MORSE 400PSI 3WAY (MISCELLANEOUS) ×12 IMPLANT
SYSTEM MITRACLIP G4 (SYSTAGENIX WOUND MANAGEMENT) ×1 IMPLANT
TRANSDUCER W/STOPCOCK (MISCELLANEOUS) ×2 IMPLANT
TUBING ART PRESS 72  MALE/FEM (TUBING) ×2
TUBING ART PRESS 72 MALE/FEM (TUBING) ×1 IMPLANT
WIRE MICRO SET SILHO 5FR 7 (SHEATH) ×1 IMPLANT
WIRE MICROINTRODUCER 60CM (WIRE) ×1 IMPLANT

## 2021-06-02 NOTE — Progress Notes (Signed)
OG tube advanced by 15cm per radiologist recommendation ?

## 2021-06-02 NOTE — Op Note (Signed)
? ? ?PROCEDURE:  Transcatheter edge to edge mitral valve repair (TEER) ?INDICATION: Severe symptomatic mitral regurgitation (Stage D) ? ?SURGEON:  Sherren Mocha, MD ?Lolly MustacheLenna Sciara, MD ? ?PROCEDURAL DETAILS: General anesthesia is induced.  The patient is prepped and draped.  Baseline transesophageal echo images are obtained and confirm appropriate anatomy for transcatheter edge-to-edge mitral valve repair.  Using vascular ultrasound guidance, the right common femoral vein is accessed via a front wall puncture.  2 Perclose sutures are deployed and an 8 [place] sheath is inserted.  A versa cross wire is advanced into the SVC.  Heparin is administered and a therapeutic ACT is achieved.  Transseptal puncture is performed over the mid posterior portion of the fossa.  The septum is dilated while the MitraClip steerable guide catheter is prepped.  After progressively dilating the right common femoral vein, the 24 French MitraClip steerable guide catheter is inserted and advanced across the interatrial septum.  The dilator and the versa cross wire were carefully removed and the guide tip is appropriately positioned approximately 3 cm across the interatrial septum.  A MitraClip XTW device is prepped per protocol.  The device is inserted through the steerable guide catheter with caution taken to avoid air entrapment.  The MitraClip device is positioned above the mitral valve after applying appropriate curve to the guide catheter.  The MitraClip device is then oriented coaxially with the anterior and posterior leaflets of the mitral valve.  Low tidal volume ventilation is initiated.  The clip device is advanced across the mitral valve and pulled back until capture of both the anterior and posterior leaflets is achieved.  Grippers are dropped and the clip is closed under TEE guidance.  Careful TEE assessment is performed and reduction in mitral valve regurgitation is felt to be appropriate.  Leaflet insertion is  verified using 3D imaging.  The MitraClip device is deployed using normal technique and the clip delivery system is removed.  After full TEE assessment, the procedural result is felt to be adequate with reduction of mitral regurgitation to 3+.  ? ?Second clip ?A MitraClip XTW device is prepped per protocol.  The device is inserted through the steerable guide catheter with caution taken to avoid air entrapment.  The MitraClip device is positioned above the mitral valve after applying appropriate curve to the guide catheter.  The MitraClip device is then oriented coaxially with the anterior and posterior leaflets of the mitral valve.  Low tidal volume ventilation is initiated.  The clip device is advanced across the mitral valve and pulled back until capture of both the anterior and posterior leaflets is achieved.  Grippers are dropped and the clip is closed under TEE guidance.  Careful TEE assessment is performed and reduction in mitral valve regurgitation is felt to be appropriate.  Leaflet insertion is verified using 3D imaging.  The MitraClip device is deployed using normal technique and the clip delivery system is removed.  After full TEE assessment, the procedural result is felt to be adequate with reduction of mitral regurgitation to 3+.  We noticed that prior to the second clip being released there was excellent reduction of mitral regurgitation however once the clip was released we had worsening of mitral regurgitation. ? ?PROCEDURE COMPLETION: The steerable guide catheter is pulled back into the right atrium and the interatrial septum is assessed with TEE.  There is no significant septal injury seen and no right to left shunting.  The guide catheter is removed and the Perclose sutures are tightened.  Protamine is administered. ? ?CONCLUSION: Successful transcatheter edge-to-edge mitral valve repair under fluoroscopic and echo guidance, reducing baseline 4+ mitral regurgitation to 3+, clip positioned  A2/P2. ? ?Early Osmond ?06/02/2021 ?6:46 PM ? ?

## 2021-06-02 NOTE — Progress Notes (Signed)
? ?Progress Note ? ?Patient Name: Matthew [name] ?Date of Encounter: 06/02/2021 ? ?[facility] Cardiologist: Larae Grooms, MD  ? ?Subjective  ? ?Patient seen today in preparation for Mitraclip procedure.  No complaints today.  Breathing remains stable.  Anxious about Mitraclip procedure. ? ?Inpatient Medications  ?  ?Scheduled Meds: ? [MAR Hold] amiodarone  400 mg Oral BID  ? [MAR Hold] atorvastatin  10 mg Oral Daily  ? [MAR Hold] chlorhexidine  4 application. Topical Once  ? [MAR Hold] Chlorhexidine Gluconate Cloth  6 each Topical Daily  ? [MAR Hold] docusate sodium  200 mg Oral Daily  ? epinephrine  0-10 mcg/min Intravenous To OR  ? [MAR Hold] feeding supplement  237 mL Oral TID BM  ? heparin-papaverine-plasmalyte irrigation   Irrigation To OR  ? insulin   Intravenous To OR  ? [MAR Hold] leptospermum manuka honey  1 application. Topical Daily  ? [MAR Hold] magnesium oxide  400 mg Oral Daily  ? magnesium sulfate  40 mEq Other To OR  ? [MAR Hold] mexiletine  300 mg Oral Q12H  ? [MAR Hold] midodrine  10 mg Oral TID WC  ? [MAR Hold] pantoprazole  40 mg Oral Daily  ? phenylephrine  30-200 mcg/min Intravenous To OR  ? [MAR Hold] polyethylene glycol  17 g Oral BID  ? potassium chloride  80 mEq Other To OR  ? [MAR Hold] senna  2 tablet Oral Daily  ? [MAR Hold] torsemide  40 mg Oral Daily  ? tranexamic acid  15 mg/kg Intravenous To OR  ? tranexamic acid  2 mg/kg Intracatheter To OR  ? ?Continuous Infusions: ? [MAR Hold] sodium chloride    ? sodium chloride    ?  ceFAZolin (ANCEF) IV    ?  ceFAZolin (ANCEF) IV    ? dexmedetomidine    ? heparin 30,000 units/NS 1000 mL solution for CELLSAVER    ? milrinone 0.125 mcg/kg/min (06/01/21 1119)  ? milrinone    ? nitroGLYCERIN    ? norepinephrine    ? tranexamic acid (CYKLOKAPRON) infusion ([facility])    ? vancomycin    ? ?PRN Meds: ?[MAR Hold] sodium chloride, [MAR Hold] acetaminophen, [MAR Hold] ondansetron (ZOFRAN) IV, [MAR Hold] sodium chloride flush, [MAR Hold] sodium  chloride flush, temazepam  ? ?Vital Signs  ?  ?Vitals:  ? 06/02/21 0004 06/02/21 0437 06/02/21 0441 06/02/21 1016  ?BP:  104/78  100/75  ?Pulse: 69 72  80  ?Resp:  20  18  ?Temp:  97.7 ?F (36.5 ?C)  97.6 ?F (36.4 ?C)  ?TempSrc:  Oral  Oral  ?SpO2:  96%  99%  ?Weight:   83 kg 83 kg  ?Height:    6\' 7"  (2.007 m)  ? ? ?Intake/Output Summary (Last 24 hours) at 06/02/2021 1239 ?Last data filed at 06/02/2021 1000 ?Gross per 24 hour  ?Intake 427.17 ml  ?Output 550 ml  ?Net -122.83 ml  ? ? ?  06/02/2021  ? 10:16 AM 06/02/2021  ?  4:41 AM 05/31/2021  ?  6:09 AM  ?Last 3 Weights  ?Weight (lbs) 182 lb 15.7 oz 182 lb 15.7 oz 184 lb 14.4 oz  ?Weight (kg) 83 kg 83 kg 83.87 kg  ?   ? ?Telemetry  ?  ?SR with PVCs - Personally Reviewed ? ?ECG  ? ? ?Physical Exam  ? ?GEN: No acute distress.   ?Neck: No JVD ?Cardiac: RRR, no murmurs, rubs, or gallops.  ?Respiratory: Clear to auscultation bilaterally. ?GI: Soft,  nontender, non-distended  ?MS: No edema; No deformity. ?Neuro:  Nonfocal  ?Psych: Normal affect  ? ?Labs  ?  ?High Sensitivity Troponin:   ?Recent Labs  ?Lab 05/13/21 ?2142 05/14/21 ?0005  ?TROPONINIHS 14 12  ?   ?Chemistry ?Recent Labs  ?Lab 05/28/21 ?0435 05/29/21 ?0725 05/30/21 ?0505 05/31/21 ?[id number] 06/01/21 ?[id number] 06/02/21 ?0510  ?NA 133* 135 132* 132* 134* 131*  ?K 4.5 4.5 4.9 4.9 5.1 4.1  ?CL 100 100 98 95* 98 101  ?CO2 27 27 26 27 27 22   ?GLUCOSE 88 78 83 85 98 92  ?BUN 38* 37* 43* 39* 42* 39*  ?CREATININE 2.35* 2.19* 2.11* 2.16* 2.44* 2.03*  ?CALCIUM 9.0 8.9 9.2 9.4 9.5 8.2*  ?MG 2.1 1.8 1.9  --   --   --   ?GFRNONAA 27* 30* 31* 30* 26* 33*  ?ANIONGAP 6 8 8 10 9 8   ?  ?Lipids No results for input(s): CHOL, TRIG, HDL, LABVLDL, LDLCALC, CHOLHDL in the last 168 hours.  ?Hematology ?Recent Labs  ?Lab 05/30/21 ?0505 06/02/21 ?0510  ?WBC 5.7 6.8  ?RBC 4.80 4.23  ?HGB 12.3* 11.0*  ?HCT 37.8* 34.4*  ?MCV 78.8* 81.3  ?MCH 25.6* 26.0  ?MCHC 32.5 32.0  ?RDW 20.7* 20.9*  ?PLT 197 167  ? ?Thyroid No results for input(s): TSH, FREET4 in the  last 168 hours.  ?BNPNo results for input(s): BNP, PROBNP in the last 168 hours.  ?DDimer No results for input(s): DDIMER in the last 168 hours.  ? ?Radiology  ?  ?DG Chest 2 View ? ?Result Date: 06/01/2021 ?CLINICAL DATA:  Preoperative. EXAM: CHEST - 2 VIEW COMPARISON:  Chest x-ray 05/25/2021. FINDINGS: Right-sided central venous catheter tip projects over the SVC. The cardiomediastinal silhouette is stable, the heart is enlarged. There is no focal lung consolidation, pleural effusion or pneumothorax. There are minimal atelectatic changes in the lung bases. No acute fractures are seen. IMPRESSION: 1. Stable cardiomegaly. 2. No acute cardiopulmonary process. Electronically Signed   By: Ronney Asters M.D.   On: 06/01/2021 23:01   ? ? ? ?Assessment & Plan  ?  ?Severe functional mitral regurgitation ([place] class IIIB):  Patient was started on milrinone yesterday due to decreased co-ox level.  Cr improved to 2.03.  Will proceed to planned Mitraclip today.  Discussed with Dr. Jeffie Pollock, will likely place Swan-ganz catheter following procedure but will depend on patient course.  All questions answered.  ? ?For questions or updates, please contact [place] ?Please consult [email or web address] for contact info under  ? ?  ?   ?Signed, ?Early Osmond, MD  ?06/02/2021, 12:39 PM   ? ?

## 2021-06-02 NOTE — Anesthesia Procedure Notes (Signed)
Procedure Name: Intubation ?Date/Time: 06/02/2021 1:13 PM ?Performed by: Gaylene Brooks, CRNA ?Pre-anesthesia Checklist: Patient identified, Emergency Drugs available, Suction available and Patient being monitored ?Patient Re-evaluated:Patient Re-evaluated prior to induction ?Oxygen Delivery Method: Circle System Utilized ?Preoxygenation: Pre-oxygenation with 100% oxygen ?Induction Type: IV induction ?Ventilation: Mask ventilation without difficulty and Oral airway inserted - appropriate to patient size ?Laryngoscope Size: Sabra Heck and 2 ?Grade View: Grade I ?Tube type: Oral ?Number of attempts: 1 ?Airway Equipment and Method: Stylet and Oral airway ?Placement Confirmation: ETT inserted through vocal cords under direct vision, positive ETCO2 and breath sounds checked- equal and bilateral ?Secured at: 23 cm ?Tube secured with: Tape ?Dental Injury: Teeth and Oropharynx as per pre-operative assessment  ? ? ? ? ?

## 2021-06-02 NOTE — Progress Notes (Addendum)
? ? Advanced Heart Failure Rounding Note ? ?PCP-Cardiologist: Larae Grooms, MD  ? ?Subjective:   ? ?04/05: RHC Low filling pressures with normal CO.  ?04/06: Seen by structural team for consideration of mitral TEER ?04/12: Started on milrinone 0.125, lactic acid 2.8>>1.7>1.6 ?04/16: Milrinone discontinued  ?04/19: CO-OX low. Milrinone 0.125 mcg restarted.  ? ?CO-OX 90%. Repeat now.   ? ?Denies SOB.  ? ?Objective:   ?Weight Range: ?83 kg ?Body mass index is 20.61 kg/m?.  ? ?Vital Signs:   ?Temp:  [97.7 ?F (36.5 ?C)-98.9 ?F (37.2 ?C)] 97.7 ?F (36.5 ?C) (04/20 [id number]) ?Pulse Rate:  [69-89] 72 (04/20 0437) ?Resp:  [19-20] 20 (04/20 0437) ?BP: (104-117)/(72-78) 104/78 (04/20 0437) ?SpO2:  [96 %] 96 % (04/20 0437) ?Weight:  [83 kg] 83 kg (04/20 0441) ?Last BM Date : 06/01/21 ? ?Weight change: ?Filed Weights  ? 05/29/21 0615 05/31/21 0609 06/02/21 0441  ?Weight: 84.8 kg 83.9 kg 83 kg  ? ? ?Intake/Output:  ? ?Intake/Output Summary (Last 24 hours) at 06/02/2021 0946 ?Last data filed at 06/02/2021 0300 ?Gross per 24 hour  ?Intake 427.17 ml  ?Output --  ?Net 427.17 ml  ? ?  ? ? ?Physical Exam  ?CVP 4 ?General:   No resp difficulty ?HEENT: normal ?Neck: supple. no JVD. Carotids 2+ bilat; no bruits. No lymphadenopathy or thryomegaly appreciated. ?Cor: PMI nondisplaced. Regular rate & rhythm. No rubs, gallops. 3/6 MR . ?Lungs: clear ?Abdomen: soft, nontender, nondistended. No hepatosplenomegaly. No bruits or masses. Good bowel sounds. ?Extremities: no cyanosis, clubbing, rash, edema. RUE PICC  ?Neuro: alert & orientedx3, cranial nerves grossly intact. moves all 4 extremities w/o difficulty. Affect pleasant ? ?SR 70-80s  personally checked  ?Labs  ?  ?CBC ?Recent Labs  ?  06/02/21 ?0510  ?WBC 6.8  ?HGB 11.0*  ?HCT 34.4*  ?MCV 81.3  ?PLT 167  ? ? ?Basic Metabolic Panel ?Recent Labs  ?  06/01/21 ?[id number] 06/02/21 ?0510  ?NA 134* 131*  ?K 5.1 4.1  ?CL 98 101  ?CO2 27 22  ?GLUCOSE 98 92  ?BUN 42* 39*  ?CREATININE 2.44* 2.03*  ?CALCIUM  9.5 8.2*  ? ?Liver Function Tests ?No results for input(s): AST, ALT, ALKPHOS, BILITOT, PROT, ALBUMIN in the last 72 hours. ?No results for input(s): LIPASE, AMYLASE in the last 72 hours. ?Cardiac Enzymes ?No results for input(s): CKTOTAL, CKMB, CKMBINDEX, TROPONINI in the last 72 hours. ? ?BNP: ?BNP (last 3 results) ?Recent Labs  ?  05/09/21 ?[id number] 05/10/21 ?0205 05/13/21 ?2142  ?BNP >4,500.0* >4,500.0* >4,500.0*  ? ? ?ProBNP (last 3 results) ?No results for input(s): PROBNP in the last 8760 hours. ? ? ?D-Dimer ?No results for input(s): DDIMER in the last 72 hours. ?Hemoglobin A1C ?No results for input(s): HGBA1C in the last 72 hours. ?Fasting Lipid Panel ?No results for input(s): CHOL, HDL, LDLCALC, TRIG, CHOLHDL, LDLDIRECT in the last 72 hours. ?Thyroid Function Tests ?No results for input(s): TSH, T4TOTAL, T3FREE, THYROIDAB in the last 72 hours. ? ?Invalid input(s): FREET3 ? ?Other results: ? ? ?Imaging  ? ? ?DG Chest 2 View ? ?Result Date: 06/01/2021 ?CLINICAL DATA:  Preoperative. EXAM: CHEST - 2 VIEW COMPARISON:  Chest x-ray 05/25/2021. FINDINGS: Right-sided central venous catheter tip projects over the SVC. The cardiomediastinal silhouette is stable, the heart is enlarged. There is no focal lung consolidation, pleural effusion or pneumothorax. There are minimal atelectatic changes in the lung bases. No acute fractures are seen. IMPRESSION: 1. Stable cardiomegaly. 2. No acute cardiopulmonary process. Electronically Signed  By: Ronney Asters M.D.   On: 06/01/2021 23:01   ? ? ?Medications:   ? ? ?Scheduled Medications: ? amiodarone  400 mg Oral BID  ? atorvastatin  10 mg Oral Daily  ? chlorhexidine  4 application. Topical Once  ? Chlorhexidine Gluconate Cloth  6 each Topical Daily  ? docusate sodium  200 mg Oral Daily  ? epinephrine  0-10 mcg/min Intravenous To OR  ? feeding supplement  237 mL Oral TID BM  ? heparin-papaverine-plasmalyte irrigation   Irrigation To OR  ? insulin   Intravenous To OR  ? leptospermum  manuka honey  1 application. Topical Daily  ? magnesium oxide  400 mg Oral Daily  ? magnesium sulfate  40 mEq Other To OR  ? mexiletine  300 mg Oral Q12H  ? midodrine  10 mg Oral TID WC  ? pantoprazole  40 mg Oral Daily  ? phenylephrine  30-200 mcg/min Intravenous To OR  ? polyethylene glycol  17 g Oral BID  ? potassium chloride  80 mEq Other To OR  ? senna  2 tablet Oral Daily  ? torsemide  40 mg Oral Daily  ? tranexamic acid  15 mg/kg Intravenous To OR  ? tranexamic acid  2 mg/kg Intracatheter To OR  ? ? ?Infusions: ? sodium chloride    ?  ceFAZolin (ANCEF) IV    ?  ceFAZolin (ANCEF) IV    ? dexmedetomidine    ? heparin 30,000 units/NS 1000 mL solution for CELLSAVER    ? heparin 1,700 Units/hr (06/01/21 2302)  ? milrinone 0.125 mcg/kg/min (06/01/21 1119)  ? milrinone    ? nitroGLYCERIN    ? norepinephrine    ? tranexamic acid (CYKLOKAPRON) infusion ([facility])    ? vancomycin    ? ? ?PRN Medications: ?sodium chloride, acetaminophen, ondansetron (ZOFRAN) IV, sodium chloride flush, sodium chloride flush, temazepam ? ? ? ?Patient Profile  ? ?81 y.o. male with history of chronic biventricular HF, NICM, CAD, severe MR/TR, HTN, obesity and multiple recent admissions, now admitted w/ acute PE and a/c CHF.  ? ?Echo this admission, EF 25%, mildly dilated RV with moderately decreased systolic function, severe MR and TR. IVC dilated.  ? ?Assessment/Plan  ? ?1. Acute on chronic Biventricular HF   ?- ECHO 04/2020 EF 25-30% with mild MR.   ?- Echo 04/04/21 EF LVEF 20-25%, RV moderately reduced, LA/RA severely dilated, and severe MR.  ?- Echo per Dr. 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 ?- cMRI c/w severe NICM  w/ basal septal midwall LGE  ?- Elevated lactic acid 2.8 on 04/12. Started on milrinone 0.125 with improvement in lactic acidosis. Milrinone discontinued 4/16. Co-ox 56% today. Scr trending back up, 2.16>>2.44 ?- Milrinone restarted 4/19. Improved CO-OX  ?- CO-oX 91%. Repeat. Continue milrinone 0.125 mcg.  ?- Volume status stable.  ?- GDMT limited d/t hypotension and AKI. Continue midodrine 10 mg TID for help w/ renal perfusion  ?- Off Spiro w/ elevated K and SCr  ?- Still with frequent PVCs. Discussed again with EP. He is not a candidate for PVC ablation.  ?- D/w structural Heart team. --> Mitral Clip today.  ? ?2. Lactic acidosis ?- Suspect d/t low-output  HF ?- Resolved with milrinone ?- BCx NGTD procalcitonin < 0.10 X 2 ?- No leukocytosis, AF, CXR 04/12 with no acute process ?- Milrinone stopped 4/16 . CO-OX down so milrinone restarted.    ? ?3. CAD ?- LHC w/ mild non-obstructive CAD ?- No chest pain.  ?- Continue statin ?  ?4. Mitral Regurgitation ?- Severe on echo this admit and last echo 02/23 ?- TEE showed mild restriction of P2. Severe central/posterior MR due to P2 restriction and annular dilation ?- functional, severely dilated LA and LV  ?- Seen by structural team. Planning mitral TEER today.  ?  ?5. Tricuspid Regurgitation  ?- moderate on TEE  ?- functional, severely dilated RA and RV   ?- HF optimization per above  ?  ?6. Acute PE ?- On Eliquis. Will hold prior to MitraClip and cover w/ heparin gtt  ?- No evidence of DVT on [place] ?- ? 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  ?7. Hyperlipidemia ?- Continue statin ?  ?8. AKI on CKD IIIa ?- Scr  baseline 1.4-1.7 last few months, this admit 1.4>>1.76>2.02>>>2.30>2.17 > 2.35> 2.19>2.11>2.16>2.44 >2.03  ?Suspect cardiorenal syndrome. ?-Improving with inotrope.  ?  ?9. NSVT/PVCs ?- suspect contributing to CM , need to suppress. ?- Still with very frequent PVCs. ?- continue mexiletine 300 mg BID  ?- seen by EP 04/12,  amiodarone increased to 400 mg BID. See above ?- Keep K > 4 and Mag > 2 ?- per EP, he is not an ablation candidate ? ?10. Hyponatremia ?- limit FW ? ?11. Hyperkalemia ?- Received Lokelma. Resolved. ?- K

## 2021-06-02 NOTE — Progress Notes (Addendum)
O2 sats decreasing; very hard to arouse, squeezed hands to command. Placed on NRB then bagged w/ambu bag. Not moving much air.  Dr. Excell Seltzer and Dr. Lynnette Caffey at bedside. Lasix 40mg  IV given.RT paged. Anesthesia here. Sats dropping. Patient intubated at 1640, settings 660 TV, rate 20, 100% FIO2, peep 10. PCXR done.  ?

## 2021-06-02 NOTE — Anesthesia Procedure Notes (Signed)
Procedure Name: Intubation ?Date/Time: 06/02/2021 4:51 PM ?Performed by: Val Eagle, MD ?Pre-anesthesia Checklist: Patient identified and Patient being monitored ?Patient Re-evaluated:Patient Re-evaluated prior to induction ?Oxygen Delivery Method: Ambu bag ?Preoxygenation: Pre-oxygenation with 100% oxygen ?Induction Type: IV induction ?Laryngoscope Size: Glidescope ?Tube type: Oral ?Tube size: 8.0 mm ?Number of attempts: 1 ?Airway Equipment and Method: Stylet and Video-laryngoscopy ?Placement Confirmation: ETT inserted through vocal cords under direct vision and breath sounds checked- equal and bilateral ?Secured at: 22 cm ?Tube secured with: Tape ?Dental Injury: Teeth and Oropharynx as per pre-operative assessment  ? ? ? ? ?

## 2021-06-02 NOTE — Assessment & Plan Note (Signed)
Started on nutritional supplements. ?Patient will need physical rehab post discharge.  ?

## 2021-06-02 NOTE — Progress Notes (Signed)
IP rehab admissions - Noted patient having Mitral clip (Teer) done today.  Not medically ready for CIR today.  Please let us know when patient is medically ready for inpatient rehab admission.  [phone] ?

## 2021-06-02 NOTE — Anesthesia Procedure Notes (Signed)
Arterial Line Insertion ?Start/End4/20/2023 10:20 AM, 06/02/2021 10:30 AM ?Performed by: Rande Brunt, CRNA, CRNA ? Patient location: Pre-op. ?Preanesthetic checklist: patient identified, IV checked, site marked, risks and benefits discussed, surgical consent, monitors and equipment checked, pre-op evaluation, timeout performed and anesthesia consent ?Lidocaine 1% used for infiltration ?Right, radial was placed ?Catheter size: 20 G ?Hand hygiene performed  and maximum sterile barriers used  ? ?Attempts: 1 ?Procedure performed without using ultrasound guided technique. ?Following insertion, dressing applied and Biopatch. ?Post procedure assessment: normal and unchanged ? ? ? ?

## 2021-06-02 NOTE — Progress Notes (Signed)
Pt arrived to 2H07. Respiratory and cath lab RN at bedside. Femoral site check with cath lab RN Debbie, no bleeding or hematoma present.  All medications associated to pumps. VSS. Notified CCM of order needs.  ?

## 2021-06-02 NOTE — Consult Note (Addendum)
? ?NAME:  Matthew [name], MRN:  [id number], DOB:  Dec 17, 1940, LOS: 81 ?ADMISSION DATE:  05/13/2021, CONSULTATION DATE:  4/20 ?REFERRING MD:  Dr. Cathlean Sauer, CHIEF COMPLAINT:  Desaturations post procedure  ? ?History of Present Illness:  ?81 y/o M who presented to [facility] on 3/31 with chest pain and shortness of breath.   ? ?He was recently admitted from 3/25-3/28 for UTI, dehydration and encephalopathy.  He was seen by Cardiology on 3/31 with concern for worsening biventricular function / volume overload with plan for repeat R/L heart cath and TEE.  His lasix dosing was adjusted at that time.  ? ?He returned to the ER on 3/31 with reports of rather acute onset shortness of breath. He was admitted by [facility] for further care. Work up was consistent with an acute RLL segmental and subsegmental PE, acute on chronic congestive heart failure / biventricular failure and severe mitral regurgitation. Cardiology was consulted.  He was treated with IV heparin for acute PE. He was diuresed.  GDMT was initially unable to be instituted due to hypotension requiring midodrine. Further cardiac work up revealed severe central & posterior leaflet MR.  He underwent RHC 4/5 with low filling pressures with normal CO.  He was evaluated by the Structural Heart Team and recommended for MitraClip.  He was started on milrinone 4/12 and this was discontinued 4/16.  Co-ox was ow on 4/19 and milrinone was restarted. Clinical course complicated by AKI on CKD, PVC's and known aortic aneurysm of 4.5 cm.  ? ?On 4/20 he underwent MitraClip placement reducing mitral regurgitation from 4+ to 3+.  Postprocedure he was transported to the Cath Lab holding area but was noted to have decreased mental status and periods of desaturation into the 60s.  The patient was reintubated in the holding area.  Post intubation x-ray showed small effusion and left basilar atelectasis.  Subsequent ABG reflected acute hypercarbia with pH of 7.2/PCO2 60. ? ?PCCM consulted for  assistance with ICU care. ?Pertinent  Medical History  ?CAD ?Chronic heart failure with reduced EF ?HTN ?HLD ?NICM ?Severe mitral regurgitation s/p MitraClip ?Thoracic aortic aneurysm ?Obesity ?Osteoarthritis ?Tricuspid regurgitation ?Thrombocytopenia ? ?Significant Hospital Events: ?Including procedures, antibiotic start and stop dates in addition to other pertinent events   ?3/31 Admit ?4/20 MitraClip placement.  Developed respiratory failure postprocedure requiring reintubation. ? ?Interim History / Subjective:  ?As above  ?RN reports patient was altered but responding before re-intubation, sats dipped into the 60's ? ?Objective   ?Blood pressure 116/72, pulse 88, temperature (!) 97.5 ?F (36.4 ?C), temperature source Temporal, resp. rate 20, height 6\' 7"  (2.007 m), weight 83 kg, SpO2 92 %. ?CVP:  [5 mmHg-13 mmHg] 5 mmHg  ?Vent Mode: PRVC ?FiO2 (%):  [60 %] 60 % ?Set Rate:  [20 bmp] 20 bmp ?Vt Set:  [id number] mL] 660 mL ?PEEP:  [5 cmH20] 5 cmH20 ?Plateau Pressure:  [29 cmH20] 29 cmH20  ? ?Intake/Output Summary (Last 24 hours) at 06/02/2021 1735 ?Last data filed at 06/02/2021 1554 ?Gross per 24 hour  ?Intake 1327.17 ml  ?Output 550 ml  ?Net 777.17 ml  ? ?Filed Weights  ? 05/31/21 0609 06/02/21 0441 06/02/21 1016  ?Weight: 83.9 kg 83 kg 83 kg  ? ? ?Examination: ?General: elderly adult male lying in bed in NAD on vent  ?HENT: MM pink/moist, ETT  ?Lungs: even/non-labored at rest, lungs bilaterally with coarse rhonchi  ?Cardiovascular: s1s2 rrr, wide QRS but regular rhythm ?Abdomen: soft/scaphoid, non-tender, bsx4 active  ?Extremities: warm/dry, no edema  ?  Neuro: sedate, coughs with stimulation  ?  ? ?Resolved Hospital Problem list   ?  ? ?Assessment & Plan:  ? ?Acute Hypercarbic / Hypoxemic Respiratory Failure  ?Suspect patient slow to wake post sedation, hypercarbic  ?-PRVC 8cc/kg, rate 20  ?-wean PEEP / fiO2 for sats >90% ?-follow up CXR post intubation, advance ETT 2 cm  ?-follow up ABG  ?-goal WUA with SBT in am  ?-PAD  protocol with fentanyl infusion, precedex for RASS Goal of 0 to -1  ? ?Acute on Chronic BiVentricular Heart Failure  ?NSVT/PVC's  ?Ectopy thought to be contributing to CM with goal for suppression  ?-per CHF team  ?-tele monitoring  ?-continue milrinone  ?-follow lactic acid, co-ox   ?-strict I/O's  ?-follow electrolytes  ?-continue amiodarone, eliquis, mexiletine, entresto, diuretics  ?-continue midodrine  ?-follow CVP  ? ?CAD ?HLD  ?Non-obstructive on LHC  ?-continue statin  ? ?RLL Acute Pulmonary Embolism  ?Segmental & subsegemental  ?-resume eliquis post procedure (per Cards) ? ?AKI on CKD IIIa ?Baseline sr cr 1.4-17 ?-Trend BMP / urinary output ?-Replace electrolytes as indicated ?-Avoid nephrotoxic agents, ensure adequate renal perfusion ? ?Ascending Aortic Aneurysm  ?-will need outpatient Thoracic Follow up, semi-annual CTA or MRA ? ?At Risk Malnutrition ?-begin TF for nutritional support  ? ?Deconditioning  ?-PT/OT ?-CIR evaluation once extubated  ? ?Constipation  ?-bowel regimen  ?-early mobilization as able  ? ?Best Practice (right click and "Reselect all SmartList Selections" daily)  ?Diet/type: NPO ?DVT prophylaxis: other ?GI prophylaxis: PPI ?Lines: Central line ?Foley:  N/A ?Code Status:  full code ?Last date of multidisciplinary goals of care discussion: full code. Further discussion pending.  ? ?Labs   ?CBC: ?Recent Labs  ?Lab 05/30/21 ?0505 06/02/21 ?0510  ?WBC 5.7 6.8  ?HGB 12.3* 11.0*  ?HCT 37.8* 34.4*  ?MCV 78.8* 81.3  ?PLT 197 167  ? ? ?Basic Metabolic Panel: ?Recent Labs  ?Lab 05/27/21 ?[id number] 05/28/21 ?0435 05/29/21 ?0725 05/30/21 ?0505 05/31/21 ?[id number] 06/01/21 ?[id number] 06/02/21 ?0510  ?NA 133* 133* 135 132* 132* 134* 131*  ?K 4.3 4.5 4.5 4.9 4.9 5.1 4.1  ?CL 98 100 100 98 95* 98 101  ?CO2 25 27 27 26 27 27 22   ?GLUCOSE 73 88 78 83 85 98 92  ?BUN 38* 38* 37* 43* 39* 42* 39*  ?CREATININE 2.17* 2.35* 2.19* 2.11* 2.16* 2.44* 2.03*  ?CALCIUM 9.0 9.0 8.9 9.2 9.4 9.5 8.2*  ?MG 2.3 2.1 1.8 1.9  --   --    --   ? ?GFR: ?Estimated Creatinine Clearance: 34.1 mL/min (A) (by C-G formula based on SCr of 2.03 mg/dL (H)). ?Recent Labs  ?Lab 05/27/21 ?[id number] 05/30/21 ?0505 06/02/21 ?0510  ?PROCALCITON 0.10  --   --   ?WBC  --  5.7 6.8  ? ? ?Liver Function Tests: ?No results for input(s): AST, ALT, ALKPHOS, BILITOT, PROT, ALBUMIN in the last 168 hours. ?No results for input(s): LIPASE, AMYLASE in the last 168 hours. ?No results for input(s): AMMONIA in the last 168 hours. ? ?ABG ?   ?Component Value Date/Time  ? PHART 7.439 05/18/2021 1056  ? PCO2ART 37.0 05/18/2021 1056  ? PO2ART 80 (L) 05/18/2021 1056  ? HCO3 25.6 05/18/2021 1107  ? TCO2 27 05/18/2021 1107  ? O2SAT 90.9 06/02/2021 0615  ?  ? ?Coagulation Profile: ?No results for input(s): INR, PROTIME in the last 168 hours. ? ?Cardiac Enzymes: ?No results for input(s): CKTOTAL, CKMB, CKMBINDEX, TROPONINI in the last 168 hours. ? ?HbA1C: ?Hgb A1c  MFr Bld  ?Date/Time Value Ref Range Status  ?05/07/2021 09:30 PM 5.8 (H) 4.8 - 5.6 % Final  ?  Comment:  ?  (NOTE) ?Pre diabetes:          5.7%-6.4% ? ?Diabetes:              >6.4% ? ?Glycemic control for   <7.0% ?adults with diabetes ?  ? ? ?CBG: ?No results for input(s): GLUCAP in the last 168 hours. ? ?Review of Systems:   ?Unable to complete as patient is altered on mechanical ventilation  ? ?Past Medical History:  ?He,  has a past medical history of CAD (coronary artery disease), Chronic HFrEF (heart failure with reduced ejection fraction) ([place]), Chronic kidney disease, stage 3b ([place]), Encephalopathy (04/2021), HTN (hypertension), Hyperlipidemia, NICM (nonischemic cardiomyopathy) ([place]), Obesity, Osteoarthritis, S/P mitral valve clip implantation (06/02/2021), Severe mitral regurgitation, Thoracic aortic aneurysm (TAA) ([place]), Thrombocytopenia ([place]), and Tricuspid regurgitation.  ? ?Surgical History:  ? ?Past Surgical History:  ?Procedure Laterality Date  ? CHONDROPLASTY Left 01/11/2017  ? Procedure: CHONDROPLASTY of medial and  patella compartment;  Surgeon: Dorna Leitz, MD;  Location: [facility];  Service: Orthopedics;  Laterality: Left;  ? KNEE ARTHROSCOPY Left 01/11/2017  ? Procedure: ARTHROSCOPIC IRRIGATION AND DEBRIDMENT KNEE;  Surgeon:

## 2021-06-02 NOTE — Progress Notes (Addendum)
Per Dr. Lynnette Caffey, Theone Murdoch d/c'ed. Rt IJ 75F sheath kept in per Dr. Lynnette Caffey. 0.9NS started at 10cc/hr to rt IJ sheath. Arousable to name; nodds appropriately.  ?

## 2021-06-02 NOTE — Progress Notes (Addendum)
?Progress Note ? ? ?Patient: Matthew [name] [id number] DOB: [birth year] DOA: 05/13/2021     19 ?DOS: the patient was seen and examined on 06/02/2021 ?  ?Brief hospital course: ?Mr. Matthew [name] was admitted to the hospital with the working diagnosis of decompensated heart failure in the setting of mitral regurgitation.  ? ?81 yo male with the past medical history of CAD, heart failure, hypertension, severe mitral regurgitation, and BPH, who presented with chest pain and dyspnea. Reported acute onset of substernal/ left sided chest pain, radiated to his left arm and associated with dyspnea. Positive lower extremity edema.  ?Recent hospitalization 03/25 to 05/10/21 for urinary tract infection, complicated with metabolic encephalopathy. Outpatient follow up with cardiology 24 hrs before patient admitted this time, diuretic therapy was increased and plan for further work up with cardiac catheterization and transesophageal echocardiogram. On his initial physical examination his blood pressure was 106/95, HR 107, RR 22, 02 saturation 97%, positive JVD, heart with S1 and S2 present and tachycardic, lungs with no wheezing or rales, abdomen not distended and positive lower extremity edema.  ? ?Na 136, K 5.0, Cl 102, bicarbonate at 25, glucose 91 bun 18, cr 1,42 ?BNP >4,500 ?Wbc 4.3, hgb 12.9 hct 41.1 plt 104 ? ?Chest radiograph with cardiomegaly, positive hilar vascular congestion and fluid in the right fissure.  ? ?EKG 93 bpm, normal axis, qtc 509, right bundle branch block, sinus rhythm with PVC, no significant ST segment or T wave changes.  ? ?CT chest with subsegmental and segmental pulmonary artery fillings defects in the right lower lobe. Small bilateral pleural effusions,  ?Aneurysmal dilatation of the ascending aorta.  ? ?Patient was placed on furosemide for diuresis, and anticoagulation with apixaban.  ?Diuresis with furosemide.  ?Frequent PVC and NSVT, placed on amiodarone and mexiletine. ? ?Cardiac catheterization  with no significant coronary artery disease. Pulmonary capillary wedge pressure 3. CO/CI 6.1/3.0 ?TEE with LV EF less than 20%, severe reduction in RV systolic function. Sever mitral regurgitation.  ? ?Plan for possible mitral valve intervention later this month. ?His volume status has improved but positive hypoperfusion. ?04/13 resumed on milrinone infusion.  ? ?Patient continue to have ectopy with PVC despite antiarrhythmic therapy. Plan for mitral transcatheter edge to edge repair.  ? ?Plan to transfer patient to CIR when renal function improves.  ? ? ?Assessment and Plan: ?* Acute pulmonary embolism (Matthew [name]) ?Holding apixaban in preparation for valve intervention, currently on IV heparin for anticoagulation.   ? ?Acute on chronic congestive heart failure (Matthew [name]) ?Patient was admitted to the cardiac ward and was placed IV furosemide for diuresis, negative fluid balance was achieved with significant improvement in his symptoms.  ? ?Echocardiogram with LV systolic function reduced to 25%, with global hypokinesis. RV systolic function is preserved. RV size with severe dilatation, RVSP 35,6 mmHg, severe dilatation of right and left atrium. Severe mitral regurgitation.  ? ?Patient with frequent PVC and NSVT, patient has been placed on amiodarone and mexiletin to suppress ectopy that possible are driving his cardiomyopathy.   ? ?Patient with signs of hypoperfusion.   ?Currently on milrinone infusion and continue with midodrine for blood pressure support.  ? ?Per EP recommendations continue with amiodarone and mexiletine. ? ?Plan for mitral valve edge to edge repair. ?Diuresis with torsemide.   ? ?Acute kidney injury superimposed on chronic kidney disease (Matthew [name]) ?CKD stage 3a, Hyponatremia. Hypomagnesemia. Hyperkalemia  ? ?Renal function today with serum cr at 2,0 with K at 4,1 and serum bicarbonate at 22.  ? ?Patient  on milrinone and midodrine. ?Diuresis with torsemide.   ? ?Dyslipidemia ?Continue with statin therapy.   ? ?Ascending aortic aneurysm (Matthew [name]) ?CT showing aneurysmal dilation of ascending aorta measuring 4.5 cm.  ?-Radiologist recommending semi-annual imaging followup by CTA or MRA ?-Outpatient cardiothoracic surgery follow-up ? ?Protein-calorie malnutrition, severe ?Started on nutritional supplements. ?Patient will need physical rehab post discharge.  ? ? ? ? ?  ? ?Subjective: Patient with no dyspnea or chest pain, no orthopnea or PND  ? ?Physical Exam: ?Vitals:  ? 06/01/21 2046 06/02/21 0004 06/02/21 [id number] 06/02/21 0441  ?BP: 117/76  104/78   ?Pulse: 87 69 72   ?Resp: 19  20   ?Temp: 98.5 ?F (36.9 ?C)  97.7 ?F (36.5 ?C)   ?TempSrc: Oral  Oral   ?SpO2: 96%  96%   ?Weight:    83 kg  ?Height:      ? ?Neurology awake and alert ?ENT with no pallor ?Cardiovascular with S1 and S2 present and rhythmic, positive systolic murmur 3/6 at the apex  ?No JVD  ?Respiratory with no rales or wheezing ?Abdomen soft and not distended ?Trace lower extremity edema  ? ?Data Reviewed: ? ? ? ?Family Communication: no family at the bedside  ? ?Disposition: ?Status is: Inpatient ?Remains inpatient appropriate because: mitral valve repair  ? Planned Discharge Destination:  CIR  ? ?Author: ?Tawni Millers, MD ?06/02/2021 9:42 AM ? ?For on call review [email or web address].  ?

## 2021-06-02 NOTE — Progress Notes (Signed)
? ?  [facility]   ?MULTIDISCIPLINARY HEART VALVE TEAM ? ?Patient is now s/p TEER with MitraClip XTW x2 in the A2/P2 position reducing MR from 4+ to 3+. He was transported to cath lab holding area in stable condition however was found to have agonal breathing with O2 saturations in the 60's. Dr. Excell Seltzer, Dr. Lynnette Caffey, and Dr. Maple Hudson at bedside on my arrival. Patient intubated. Post intubation CXR with central vascular congestion, likely left basilar atelectasis and small effusion. CCM consulted for ETT assistance. Transfer to 2H.  ? ?Georgie Chard NP-C ?Structural Heart Team  ?Pager: [phone] ?Phone: [phone] ? ? ?

## 2021-06-02 NOTE — Plan of Care (Signed)
  Problem: Respiratory: Goal: Ability to maintain a clear airway and adequate ventilation will improve Outcome: Progressing   Problem: Role Relationship: Goal: Method of communication will improve Outcome: Progressing   

## 2021-06-02 NOTE — Progress Notes (Signed)
CRITICAL RESULT PROVIDER NOTIFICATION ? ?Test performed and critical result:  PTT more than 200 ? ?Date and time result received:  06/02/21 1145 ? ?Provider name/title: Dr. Lynnette Caffey and Dr. Maple Hudson ? ?Date and time provider notified: 1147 ? ?Date and time provider responded: 1147 ? ?Provider response:At bedside Dr. Maple Hudson in room. Will call Dr. Lynnette Caffey ? ? ? ? ?

## 2021-06-02 NOTE — Progress Notes (Signed)
Patient transported from cath lab to 2h07 without complications. Vitals stable throughout. RT for unit made aware. RT will continue to monitor. ?

## 2021-06-02 NOTE — Anesthesia Procedure Notes (Signed)
Central Venous Catheter Insertion ?Performed by: Val Eagle, MD, anesthesiologist ?Start/End4/20/2023 11:44 AM, 06/02/2021 11:54 AM ?Patient location: Pre-op. ?Preanesthetic checklist: patient identified, IV checked, risks and benefits discussed, surgical consent, monitors and equipment checked, pre-op evaluation, timeout performed and anesthesia consent ?Position: supine ?Lidocaine 1% used for infiltration ?Hand hygiene performed  and maximum sterile barriers used  ?Catheter size: 8 Fr ?Sheath introducer ?Procedure performed using ultrasound guided technique. ?Ultrasound Notes:anatomy identified, needle tip was noted to be adjacent to the nerve/plexus identified, no ultrasound evidence of intravascular and/or intraneural injection and image(s) printed for medical record ?Attempts: 1 ?Following insertion, line sutured and dressing applied. ?Post procedure assessment: blood return through all ports, free fluid flow and no air ? ?Patient tolerated the procedure well with no immediate complications. ? ? ? ? ?

## 2021-06-02 NOTE — Anesthesia Preprocedure Evaluation (Addendum)
Anesthesia Evaluation  ?Patient identified by MRN, date of birth, ID band ?Patient awake ? ? ? ?Reviewed: ?Allergy & Precautions, NPO status , Patient's Chart, lab work & pertinent test results ? ?History of Anesthesia Complications ?Negative for: history of anesthetic complications ? ?Airway ?Mallampati: III ? ?TM Distance: >3 FB ?Neck ROM: Full ? ? ? Dental ? ?(+) Dental Advisory Given, Edentulous Upper ?  ?Pulmonary ?neg pulmonary ROS,  ?  ?breath sounds clear to auscultation ? ? ? ? ? ? Cardiovascular ?hypertension, + CAD and +CHF  ? ?Rhythm:Regular  ?1. Left ventricular ejection fraction, by estimation, is 25 to 30%. The  ?left ventricle has severely decreased function. The left ventricle  ?demonstrates global hypokinesis. The left ventricular internal cavity size  ?was severely dilated.  ??2. Right ventricular systolic function is moderately reduced. The right  ?ventricular size is severely enlarged. There is mildly elevated pulmonary  ?artery systolic pressure. The estimated right ventricular systolic  ?pressure is [id number] mmHg.  ??3. Severe mitral regurgitation 2/2 restricted PMVL (functional MR).  ?Severe mitral valve regurgitation.  ??4. The aortic valve is tricuspid. Aortic valve regurgitation is mild. No  ?aortic stenosis is present.  ??5. There is mild dilatation of the ascending aorta, measuring 43 mm.  ??6. The inferior vena cava is dilated in size with <50% respiratory  ?variability, suggesting right atrial pressure of 15 mmHg.  ? ??  Prox RCA lesion is 30% stenosed. ??  Prox Cx lesion is 30% stenosed. ??  Prox LAD to Mid LAD lesion is 30% stenosed. ??  The left ventricular ejection fraction is less than 25% by visual estimate. ?? ? ?  ?Neuro/Psych ?negative neurological ROS ?   ? GI/Hepatic ?negative GI ROS, Neg liver ROS,   ?Endo/Other  ?negative endocrine ROS ? Renal/GU ?CRFRenal diseaseLab Results ?     Component                Value               Date                  ?     CREATININE               2.03 (H)            06/02/2021           ?  ? ?  ?Musculoskeletal ? ?(+) Arthritis ,  ? Abdominal ?  ?Peds ? Hematology ? ?(+) Blood dyscrasia, anemia , Lab Results ?     Component                Value               Date                 ?     WBC                      6.8                 06/02/2021           ?     HGB                      11.0 (L)            06/02/2021           ?     HCT  34.4 (L)            06/02/2021           ?     MCV                      81.3                06/02/2021           ?     PLT                      167                 06/02/2021           ?   ?Anesthesia Other Findings ? ? Reproductive/Obstetrics ? ?  ? ? ? ? ? ? ? ? ? ? ? ? ? ?  ?  ? ? ? ? ? ? ? ?Anesthesia Physical ?Anesthesia Plan ? ?ASA: 4 ? ?Anesthesia Plan: General  ? ?Post-op Pain Management: Minimal or no pain anticipated  ? ?Induction: Intravenous ? ?PONV Risk Score and Plan: 2 and Ondansetron and Dexamethasone ? ?Airway Management Planned: Oral ETT ? ?Additional Equipment: Arterial line, PA Cath and Ultrasound Guidance Line Placement ? ?Intra-op Plan:  ? ?Post-operative Plan: Extubation in OR ? ?Informed Consent: I have reviewed the patients History and Physical, chart, labs and discussed the procedure including the risks, benefits and alternatives for the proposed anesthesia with the patient or authorized representative who has indicated his/her understanding and acceptance.  ? ? ? ?Dental advisory given ? ?Plan Discussed with: CRNA ? ?Anesthesia Plan Comments:   ? ? ? ? ? ? ?Anesthesia Quick Evaluation ? ?

## 2021-06-02 NOTE — Progress Notes (Signed)
Hand grips and dorsiflexion equil bilaterally ?

## 2021-06-02 NOTE — Transfer of Care (Signed)
Immediate Anesthesia Transfer of Care Note ? ?Patient: Matthew [name] ? ?Procedure(s) Performed: MITRAL VALVE REPAIR ?TRANSESOPHAGEAL ECHOCARDIOGRAM (TEE) ? ?Patient Location: Cath Lab ? ?Anesthesia Type:General ? ?Level of Consciousness: awake, alert  and oriented ? ?Airway & Oxygen Therapy: Patient Spontanous Breathing and Patient connected to face mask oxygen ? ?Post-op Assessment: Report given to RN and Post -op Vital signs reviewed and stable ? ?Post vital signs: Reviewed and stable ? ?Last Vitals:  ?Vitals Value Taken Time  ?BP 135/93 06/02/21 1554  ?Temp 36.7 ?C 06/02/21 1554  ?Pulse 95 06/02/21 1555  ?Resp 29 06/02/21 1555  ?SpO2 93 % 06/02/21 1555  ?Vitals shown include unvalidated device data. ? ?Last Pain:  ?Vitals:  ? 06/02/21 1554  ?TempSrc: Temporal  ?PainSc: Asleep  ?   ? ?Patients Stated Pain Goal: 0 (06/01/21 2046) ? ?Complications: There were no known notable events for this encounter. ?

## 2021-06-02 NOTE — Anesthesia Procedure Notes (Signed)
Central Venous Catheter Insertion ?Performed by: Oleta Mouse, MD, anesthesiologist ?Start/End4/20/2023 11:44 AM, 06/02/2021 11:54 AM ?Patient location: Pre-op. ?Preanesthetic checklist: patient identified, IV checked, risks and benefits discussed, surgical consent, monitors and equipment checked, pre-op evaluation, timeout performed and anesthesia consent ?Hand hygiene performed  and maximum sterile barriers used  ?PA cath was placed.Swan type:thermodilution ?Procedure performed without using ultrasound guided technique. ?Attempts: 1 ?Patient tolerated the procedure well with no immediate complications. ? ? ? ?

## 2021-06-02 NOTE — Progress Notes (Signed)
ANTICOAGULATION CONSULT NOTE ? ?Pharmacy Consult for apixaban > heparin ?Indication: pulmonary embolus ? ?No Active Allergies ? ? ?Patient Measurements: ?Height: 6\' 7"  (200.7 cm) ?Weight: 83 kg (182 lb 15.7 oz) ?IBW/kg (Calculated) : 93.7 ?Heparin Dosing Weight: 90kg ? ?Vital Signs: ?Temp: 97.6 ?F (36.4 ?C) (04/20 1016) ?Temp Source: Oral (04/20 1016) ?BP: 100/75 (04/20 1016) ?Pulse Rate: 80 (04/20 1016) ? ?Labs: ?Recent Labs  ?  05/31/21 ?0608 06/01/21 ?[id number] 06/02/21 ?0510 06/02/21 ?[id number]  ?HGB  --   --  11.0*  --   ?HCT  --   --  34.4*  --   ?PLT  --   --  167  --   ?APTT  --   --  >200* >200*  ?HEPARINUNFRC  --   --  >1.10*  --   ?CREATININE 2.16* 2.44* 2.03*  --   ? ? ? ?Estimated Creatinine Clearance: 34.1 mL/min (A) (by C-G formula based on SCr of 2.03 mg/dL (H)). ? ? ?Assessment: ?32 yoM admitted with new PE. No anticoagulation PTA. Pt started on heparin infusion and ultimately apixaban will transition back to heparin in anticipation of mitraclip tomorrow 4/20.  ? ?Heparin drip started overnight. Heparin level and aptt above goal. Aptt recheck was still high over 200s. No bleeding issues noted. Heparin now off for mitraclip.  ? ?Goal of Therapy:  ?Heparin level 0.3-0.7 units/ml ?aPTT 66-102 seconds ?Monitor platelets by anticoagulation protocol: Yes ?  ?Plan:  ?F/up anticoagulation plan post op ? ?Erin Hearing PharmD., BCPS ?Clinical Pharmacist ?06/02/2021 11:59 AM ? ?

## 2021-06-03 ENCOUNTER — Inpatient Hospital Stay (HOSPITAL_COMMUNITY): Payer: Medicare Other

## 2021-06-03 ENCOUNTER — Encounter (HOSPITAL_COMMUNITY): Payer: Self-pay | Admitting: Internal Medicine

## 2021-06-03 DIAGNOSIS — J9600 Acute respiratory failure, unspecified whether with hypoxia or hypercapnia: Secondary | ICD-10-CM

## 2021-06-03 DIAGNOSIS — I5043 Acute on chronic combined systolic (congestive) and diastolic (congestive) heart failure: Secondary | ICD-10-CM | POA: Diagnosis not present

## 2021-06-03 DIAGNOSIS — Z954 Presence of other heart-valve replacement: Secondary | ICD-10-CM | POA: Diagnosis not present

## 2021-06-03 DIAGNOSIS — I5023 Acute on chronic systolic (congestive) heart failure: Secondary | ICD-10-CM | POA: Diagnosis not present

## 2021-06-03 LAB — CBC
HCT: 35.5 % — ABNORMAL LOW (ref 39.0–52.0)
Hemoglobin: 11.7 g/dL — ABNORMAL LOW (ref 13.0–17.0)
MCH: 25.9 pg — ABNORMAL LOW (ref 26.0–34.0)
MCHC: 33 g/dL (ref 30.0–36.0)
MCV: 78.7 fL — ABNORMAL LOW (ref 80.0–100.0)
Platelets: 167 10*3/uL (ref 150–400)
RBC: 4.51 MIL/uL (ref 4.22–5.81)
RDW: 20.8 % — ABNORMAL HIGH (ref 11.5–15.5)
WBC: 7 10*3/uL (ref 4.0–10.5)
nRBC: 0 % (ref 0.0–0.2)

## 2021-06-03 LAB — BASIC METABOLIC PANEL
Anion gap: 13 (ref 5–15)
BUN: 48 mg/dL — ABNORMAL HIGH (ref 8–23)
CO2: 19 mmol/L — ABNORMAL LOW (ref 22–32)
Calcium: 9 mg/dL (ref 8.9–10.3)
Chloride: 103 mmol/L (ref 98–111)
Creatinine, Ser: 2.39 mg/dL — ABNORMAL HIGH (ref 0.61–1.24)
GFR, Estimated: 27 mL/min — ABNORMAL LOW (ref >60)
Glucose, Bld: 93 mg/dL (ref 70–99)
Potassium: 4.7 mmol/L (ref 3.5–5.1)
Sodium: 135 mmol/L (ref 135–145)

## 2021-06-03 LAB — COOXEMETRY PANEL
Carboxyhemoglobin: 1.6 % — ABNORMAL HIGH (ref 0.5–1.5)
Methemoglobin: <0.7 % (ref 0.0–1.5)
O2 Saturation: 78.3 %
Total hemoglobin: 11.3 g/dL — ABNORMAL LOW (ref 12.0–16.0)

## 2021-06-03 LAB — GLUCOSE, CAPILLARY: Glucose-Capillary: 100 mg/dL — ABNORMAL HIGH (ref 70–99)

## 2021-06-03 IMAGING — DX DG CHEST 1V PORT
1 series · 1 of 1 positions shown · non-contrast
Comparison: Yesterday

CLINICAL DATA: Acute respiratory failure. Heart failure.
Hypertension. Thoracic aortic aneurysm

EXAM:
PORTABLE CHEST 1 VIEW

[chest ap]
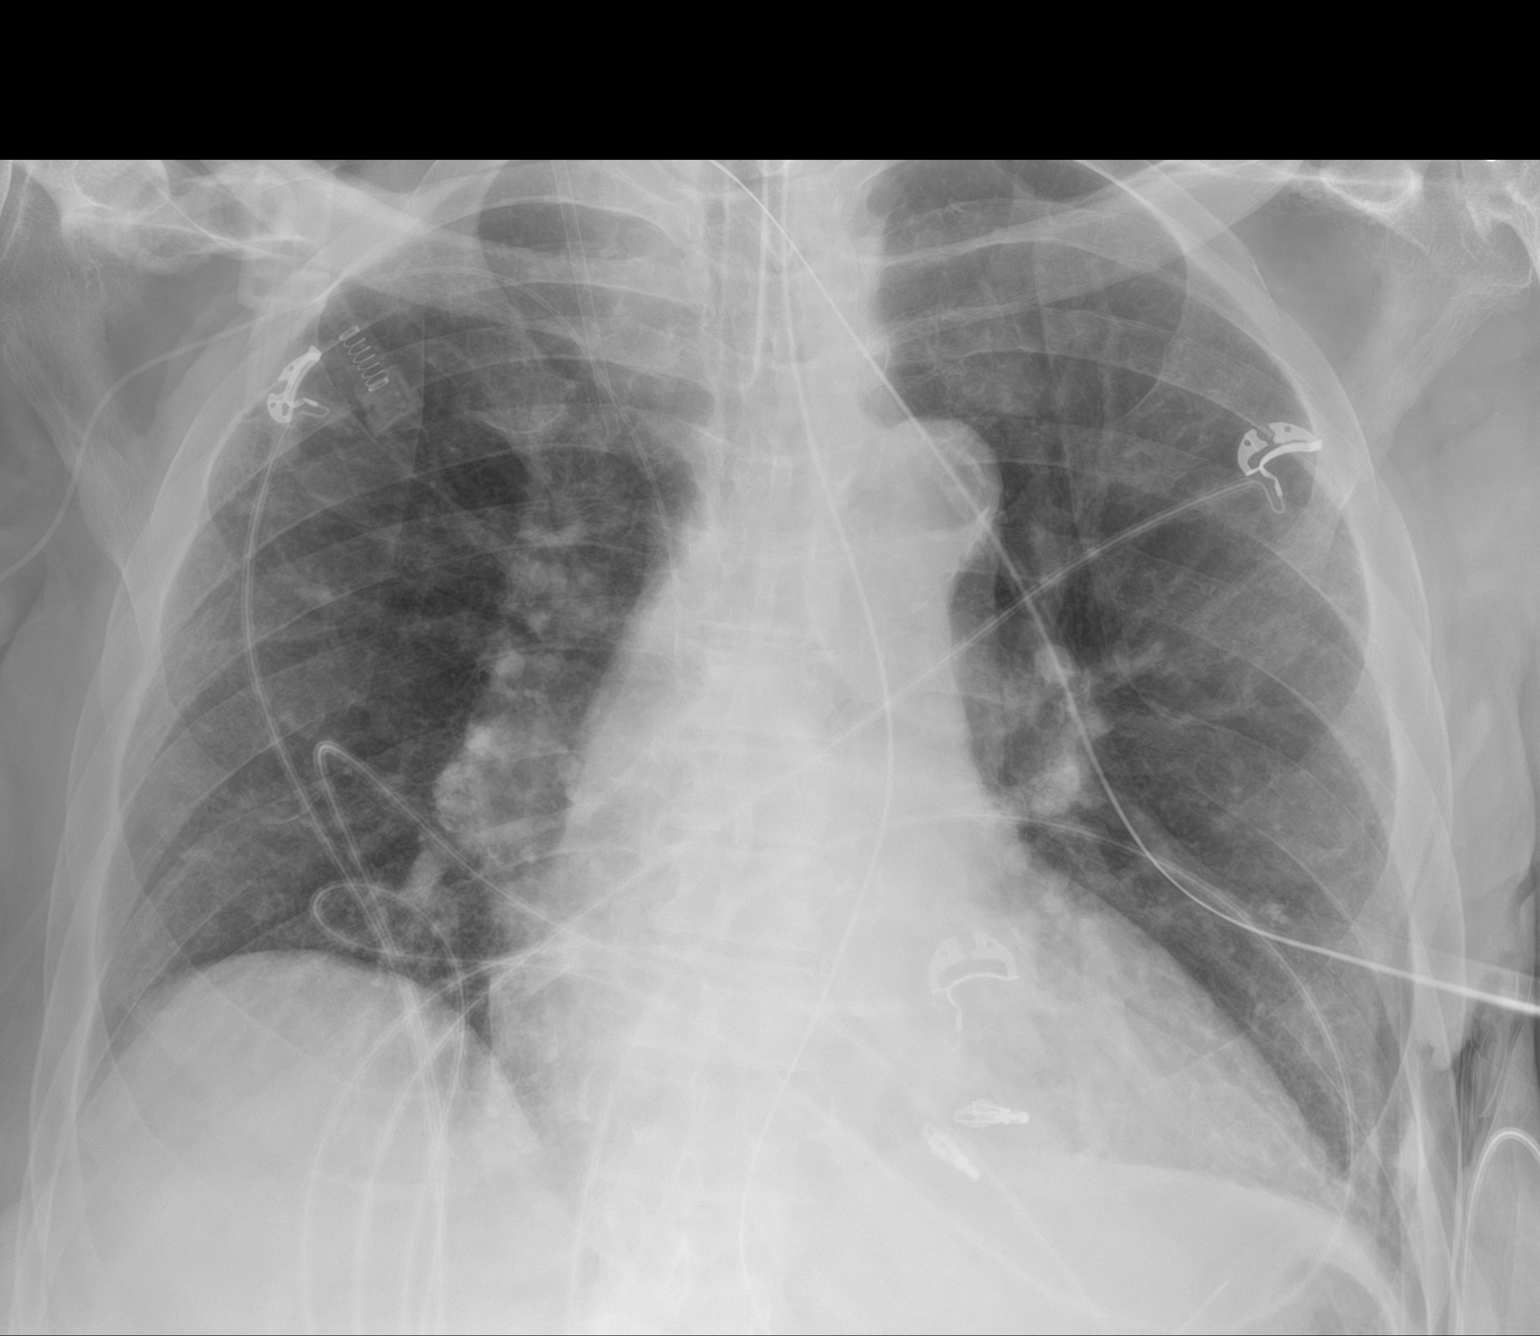

[1 of 1 positions shown; findings below may reference images not displayed]

FINDINGS: Endotracheal tube terminates 6.2 cm above carina. Nasogastric tube
extends beyond the inferior aspect of the film. Right internal
jugular Cordis sheath unchanged. Right-sided PICC line tip at low
SVC.

Midline trachea. Mild cardiomegaly. Atherosclerosis in the
transverse aorta. No pleural effusion or pneumothorax. No congestive
failure. Improved left base aeration with mild atelectasis
remaining. There is lucency about the inferolateral left chest wall.
IMPRESSION: Significantly improved aeration with nearly completely resolved left
base airspace disease/atelectasis.

Cardiomegaly without congestive failure.

Lucency about the inferolateral left chest wall. This may be
artifactual. If there is a clinical concern of subcutaneous
emphysema and/or inferolateral left pneumothorax, consider repeat
radiographs versus further evaluation with CT.

Aortic Atherosclerosis ([45]-[45]).

## 2021-06-03 MED ORDER — MIDODRINE HCL 5 MG PO TABS
10.0000 mg | ORAL_TABLET | Freq: Three times a day (TID) | ORAL | Status: DC
  Administered 2021-06-03: 10 mg via ORAL
  Filled 2021-06-03 (×2): qty 2

## 2021-06-03 MED ORDER — SENNA 8.6 MG PO TABS
2.0000 | ORAL_TABLET | Freq: Every day | ORAL | Status: DC
  Administered 2021-06-04 – 2021-06-08 (×4): 17.2 mg via ORAL
  Filled 2021-06-03 (×5): qty 2

## 2021-06-03 MED ORDER — DOCUSATE SODIUM 100 MG PO CAPS
100.0000 mg | ORAL_CAPSULE | Freq: Two times a day (BID) | ORAL | Status: DC
  Administered 2021-06-03 – 2021-06-08 (×10): 100 mg via ORAL
  Filled 2021-06-03 (×10): qty 1

## 2021-06-03 MED ORDER — MEXILETINE HCL 150 MG PO CAPS
300.0000 mg | ORAL_CAPSULE | Freq: Two times a day (BID) | ORAL | Status: DC
Start: 2021-06-03 — End: 2021-06-08
  Administered 2021-06-03 – 2021-06-08 (×10): 300 mg via ORAL
  Filled 2021-06-03 (×12): qty 2

## 2021-06-03 MED ORDER — MAGNESIUM OXIDE -MG SUPPLEMENT 400 (240 MG) MG PO TABS
400.0000 mg | ORAL_TABLET | Freq: Every day | ORAL | Status: DC
  Administered 2021-06-04 – 2021-06-08 (×5): 400 mg via ORAL
  Filled 2021-06-03 (×5): qty 1

## 2021-06-03 MED ORDER — PANTOPRAZOLE SODIUM 40 MG PO TBEC
40.0000 mg | DELAYED_RELEASE_TABLET | Freq: Every day | ORAL | Status: DC
  Administered 2021-06-04 – 2021-06-08 (×5): 40 mg via ORAL
  Filled 2021-06-03 (×5): qty 1

## 2021-06-03 MED ORDER — SODIUM CHLORIDE 0.9 % IV BOLUS
200.0000 mL | Freq: Once | INTRAVENOUS | Status: AC
  Administered 2021-06-03: 200 mL via INTRAVENOUS

## 2021-06-03 MED ORDER — APIXABAN 5 MG PO TABS
5.0000 mg | ORAL_TABLET | Freq: Two times a day (BID) | ORAL | Status: DC
  Administered 2021-06-03 – 2021-06-08 (×10): 5 mg via ORAL
  Filled 2021-06-03 (×10): qty 1

## 2021-06-03 MED ORDER — POLYETHYLENE GLYCOL 3350 17 G PO PACK
17.0000 g | PACK | Freq: Every day | ORAL | Status: DC
Start: 2021-06-04 — End: 2021-06-08
  Administered 2021-06-04 – 2021-06-08 (×3): 17 g via ORAL
  Filled 2021-06-03 (×5): qty 1

## 2021-06-03 MED ORDER — ACETAMINOPHEN 325 MG PO TABS: 650.0000 mg | ORAL_TABLET | ORAL | Status: DC | PRN

## 2021-06-03 MED ORDER — METOPROLOL SUCCINATE ER 25 MG PO TB24
25.0000 mg | ORAL_TABLET | Freq: Every day | ORAL | Status: DC
  Administered 2021-06-03: 25 mg via ORAL
  Filled 2021-06-03: qty 1

## 2021-06-03 MED ORDER — AMIODARONE HCL 200 MG PO TABS
400.0000 mg | ORAL_TABLET | Freq: Two times a day (BID) | ORAL | Status: DC
  Administered 2021-06-03 – 2021-06-08 (×10): 400 mg via ORAL
  Filled 2021-06-03 (×10): qty 2

## 2021-06-03 NOTE — Progress Notes (Addendum)
Physical Therapy Treatment ?Patient Details ?Name: Matthew [name] ?MRN: [id number] ?DOB: [birth year] ?Today's Date: 06/03/2021 ? ? ?History of Present Illness 81 y.o. male adm 3/31 with SOB with RLL PE treated with Heparin> Eliquis. Admission 3/25-28/2023 with AMS and UTI. L radial heart cath completed on 4/5. S/p transcatheter edge to edge mitral valve repair 4/20, postprocedure developed AMS and hypoxia into the 60s and was intubated. Extubated 4/21. PMHx: CAD, NICM, CHF, HTN, OA, obesity ? ?  ?PT Comments  ? ? Pt continues to display deficits in cognition, displaying memory deficits, perseverating on urinary catheter despite being educated on it multiple times. Pt also with poor deficits and safety awareness and impulsive to sit prematurely, requiring maxA to safely direct pt to the chair. Pt with trunk and lower extremity weakness and extension ROM limitations impacting his ease with transfers and posture with gait, requiring modA to come to stand and mod-maxA to take a couple steps to the L today. Will continue to follow acutely. Current recommendations remain appropriate. ?  ?Recommendations for follow up therapy are one component of a multi-disciplinary discharge planning process, led by the attending physician.  Recommendations may be updated based on patient status, additional functional criteria and insurance authorization. ? ?Follow Up Recommendations ? Acute inpatient rehab (3hours/day) ?  ?  ?Assistance Recommended at Discharge Frequent or constant Supervision/Assistance  ?Patient can return home with the following Assistance with cooking/housework;Assist for transportation;Help with stairs or ramp for entrance;A lot of help with walking and/or transfers;Two people to help with walking and/or transfers;A lot of help with bathing/dressing/bathroom;Direct supervision/assist for medications management;Direct supervision/assist for financial management ?  ?Equipment Recommendations ? Rolling Men (2 wheels)   ?  ?Recommendations for Other Services   ? ? ?  ?Precautions / Restrictions Precautions ?Precautions: Fall ?Precaution Comments: L radial heart cath on 4/5; A-line ?Restrictions ?Weight Bearing Restrictions: No  ?  ? ?Mobility ? Bed Mobility ?Overal bed mobility: Needs Assistance ?Bed Mobility: Supine to Sit ?  ?  ?Supine to sit: Min guard, HOB elevated ?  ?  ?General bed mobility comments: increased time to perform with verbal cues ?  ? ?Transfers ?Overall transfer level: Needs assistance ?Equipment used: Rolling [place] (2 wheels) ?Transfers: Sit to/from Stand, Bed to chair/wheelchair/BSC ?Sit to Stand: Mod assist, +2 safety/equipment, From elevated surface ?  ?Step pivot transfers: Mod assist, +2 safety/equipment, Max assist ?  ?  ?  ?General transfer comment: Pt requiring modA to power up to stand from elevated EOB. Pt taking a few side steps to L bed > recliner, initially requiring modA for stability and directing but then pt attempted to sit prematurely, needing maxA to cue pt to stand and direct buttocks safely to chair. +2 for safety and line management from RN ?  ? ?Ambulation/Gait ?Ambulation/Gait assistance: Mod assist, Max assist ?Gait Distance (Feet): 2 Feet ?Assistive device: Rolling [place] (2 wheels) ?Gait Pattern/deviations: Trunk flexed, Step-through pattern, Decreased stride length, Knee flexed in stance - right, Knee flexed in stance - left, Knees buckling, Decreased dorsiflexion - left ?Gait velocity: reduced ?Gait velocity interpretation: <1.31 ft/sec, indicative of household ambulator ?  ?General Gait Details: Pt with very slow, shuffling gait and flexed trunk, hips, and knees despite cues to correct. Instability noted in knees, requiring modA initially for stability. As pt tried to sit prematurely he required maxA. ? ? ?Stairs ?  ?  ?  ?  ?  ? ? ?Wheelchair Mobility ?  ? ?Modified Rankin (Stroke Patients Only) ?  ? ? ?  ?  Balance Overall balance assessment: Needs assistance ?Sitting-balance  support: Feet supported, No upper extremity supported ?Sitting balance-[name] Scale: Fair ?  ?Postural control: Posterior lean ?Standing balance support: Bilateral upper extremity supported, During functional activity ?Standing balance-[name] Scale: Poor ?Standing balance comment: Pt with posterior lean, needing mod-maxA for standing mobility with RW ?  ?  ?  ?  ?  ?  ?  ?  ?  ?  ?  ?  ? ?  ?Cognition Arousal/Alertness: Awake/alert ?Behavior During Therapy: Flat affect, Impulsive ?Overall Cognitive Status: Impaired/Different from baseline ?Area of Impairment: Attention, Following commands, Safety/judgement, Problem solving, Memory, Awareness ?  ?  ?  ?  ?  ?  ?  ?  ?  ?Current Attention Level: Focused ?Memory: Decreased recall of precautions, Decreased short-term memory ?Following Commands: Follows one step commands inconsistently, Follows one step commands with increased time ?Safety/Judgement: Decreased awareness of safety, Decreased awareness of deficits ?Awareness: Intellectual ?Problem Solving: Slow processing, Decreased initiation, Difficulty sequencing, Requires verbal cues, Requires tactile cues ?General Comments: Pt initially declining OOB mobility, but with encouragement he agreed to get OOB to chair. Required extra time and repeated multi-modal simple cues to sequence all tasks. Pt impulsively trying to sit prematurely, needing cues and assistance to direct pt safely to chair. ?  ?  ? ?  ?Exercises   ? ?  ?General Comments   ?  ?  ? ?Pertinent Vitals/Pain Pain Assessment ?Pain Assessment: Faces ?Faces Pain Scale: Hurts a little bit ?Pain Location: generalized with mobility ?Pain Descriptors / Indicators: Discomfort, Grimacing ?Pain Intervention(s): Limited activity within patient's tolerance, Monitored during session, Repositioned  ? ? ?Home Living   ?  ?  ?  ?  ?  ?  ?  ?  ?  ?   ?  ?Prior Function    ?  ?  ?   ? ?PT Goals (current goals can now be found in the care plan section) Acute Rehab PT  Goals ?Patient Stated Goal: agreeable to get to chair ?PT Goal Formulation: With patient ?Time For Goal Achievement: 06/13/21 ?Potential to Achieve Goals: Fair ?Progress towards PT goals: Not progressing toward goals - comment (limited by fatigue) ? ?  ?Frequency ? ? ? Min 3X/week ? ? ? ?  ?PT Plan Equipment recommendations need to be updated  ? ? ?Co-evaluation   ?  ?  ?  ?  ? ?  ?AM-PAC PT "6 Clicks" Mobility   ?Outcome Measure ? Help needed turning from your back to your side while in a flat bed without using bedrails?: A Little ?Help needed moving from lying on your back to sitting on the side of a flat bed without using bedrails?: A Little ?Help needed moving to and from a bed to a chair (including a wheelchair)?: A Lot ?Help needed standing up from a chair using your arms (e.g., wheelchair or bedside chair)?: A Lot ?Help needed to walk in hospital room?: Total ?Help needed climbing 3-5 steps with a railing? : Total ?6 Click Score: 12 ? ?  ?End of Session   ?Activity Tolerance: Patient tolerated treatment well ?Patient left: in chair;with call bell/phone within reach;with chair alarm set;with nursing/sitter in room ?Nurse Communication: Mobility status ?PT Visit Diagnosis: Muscle weakness (generalized) (M62.81);Unsteadiness on feet (R26.81);Other abnormalities of gait and mobility (R26.89);Difficulty in walking, not elsewhere classified (R26.2) ?  ? ? ?Time: 5631-4970 ?PT Time Calculation (min) (ACUTE ONLY): 21 min ? ?Charges:  $Therapeutic Activity: 8-22 mins          ?          ? ?  Raymond Gurney, PT, DPT ?Acute Rehabilitation Services  ?Pager: [phone] ?Office: [phone] ? ? ? ?Henrene Dodge Pettis ?06/03/2021, 4:20 PM ? ?

## 2021-06-03 NOTE — Evaluation (Signed)
Clinical/Bedside Swallow Evaluation ?Patient Details  ?Name: Matthew [name] ?MRN: [id number] ?Date of Birth: Oct 29, 1940 ? ?Today's Date: 06/03/2021 ?Time: SLP Start Time (ACUTE ONLY): [id number] SLP Stop Time (ACUTE ONLY): [id number] ?SLP Time Calculation (min) (ACUTE ONLY): 13 min ? ?Past Medical History:  ?Past Medical History:  ?Diagnosis Date  ? CAD (coronary artery disease)   ? Chronic HFrEF (heart failure with reduced ejection fraction) ([place])   ? Chronic kidney disease, stage 3b ([place])   ? Encephalopathy 04/2021  ? admitted for metabolic encephalopathy  ? HTN (hypertension)   ? Hyperlipidemia   ? NICM (nonischemic cardiomyopathy) ([place])   ? Obesity   ? Osteoarthritis   ? S/P mitral valve clip implantation 06/02/2021  ? MitraClip XTW x2 with Dr. Ali Lowe and Dr. Burt Knack  ? Severe mitral regurgitation   ? Thoracic aortic aneurysm (TAA) ([place])   ? Thrombocytopenia ([place])   ? noted in labs in 2023  ? Tricuspid regurgitation   ? ?Past Surgical History:  ?Past Surgical History:  ?Procedure Laterality Date  ? CHONDROPLASTY Left 01/11/2017  ? Procedure: CHONDROPLASTY of medial and patella compartment;  Surgeon: Dorna Leitz, MD;  Location: [facility];  Service: Orthopedics;  Laterality: Left;  ? KNEE ARTHROSCOPY Left 01/11/2017  ? Procedure: ARTHROSCOPIC IRRIGATION AND DEBRIDMENT KNEE;  Surgeon: Dorna Leitz, MD;  Location: [facility];  Service: Orthopedics;  Laterality: Left;  ? KNEE SURGERY    ? MENISECTOMY Left 01/11/2017  ? Procedure: Lateral MENISECTOMY;  Surgeon: Dorna Leitz, MD;  Location: [place];  Service: Orthopedics;  Laterality: Left;  ? MITRAL VALVE REPAIR N/A 06/02/2021  ? Procedure: MITRAL VALVE REPAIR;  Surgeon: Early Osmond, MD;  Location: [facility];  Service: Cardiovascular;  Laterality: N/A;  ? RIGHT/LEFT HEART CATH AND CORONARY ANGIOGRAPHY N/A 05/18/2021  ? Procedure: RIGHT/LEFT HEART CATH AND CORONARY ANGIOGRAPHY;  Surgeon: Jolaine Artist, MD;  Location: [place] [facility];  Service: Cardiovascular;   Laterality: N/A;  ? SYNOVECTOMY Left 01/11/2017  ? Procedure: Four compartment SYNOVECTOMY;  Surgeon: Dorna Leitz, MD;  Location: [place];  Service: Orthopedics;  Laterality: Left;  ? TEE WITHOUT CARDIOVERSION N/A 05/18/2021  ? Procedure: TRANSESOPHAGEAL ECHOCARDIOGRAM (TEE);  Surgeon: Jolaine Artist, MD;  Location: [facility];  Service: Cardiovascular;  Laterality: N/A;  ? TEE WITHOUT CARDIOVERSION N/A 06/02/2021  ? Procedure: TRANSESOPHAGEAL ECHOCARDIOGRAM (TEE);  Surgeon: Early Osmond, MD;  Location: [place] [facility];  Service: Cardiovascular;  Laterality: N/A;  ? ?HPI:  ?Pt is an 81 y.o. male who presented to the ED 3/31 with SOB and chest pain. Found to have acute RLL segmental and subsegmental PE, acute on chronic congestive heart failure/biventricular failure and severe mitral regurgitation. Pt s/p L radial heart cath 4/5. Pt underwent MitraClip placement 4/20; decreased mental status noted postprocedure with periods of desaturation into the 60s.  Pt was reintubated in the holding area. ETT 4/20-4/21 (0902). PMH: CAD, NICM, CHF, HTN, OA, obesity. Recent admission 3/25-3/28 for UTI, dehydration and encephalopathy.  ?  ?Assessment / Plan / Recommendation  ?Clinical Impression ? Pt was seen for bedside swallow evaluation. Pt's reliability as a historian is questioned considering the variability of his responses to the same question, and their inconsistent relatedness to the SLP's questions. However, he stated once that "sometimes things don't go down." Oral mechanism exam was limited due to pt's difficulty following commands; however, oral motor strength and ROM appeared grossly WFL. 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 Pt was edentulous and dentures  were found at bedside, but they were ill-fitting and pt removed them. Despite SLP's encouragement, pt was distracted by and focused on his catheter and trials were limited. Coughing was noted with thin liquids via straw,  but pt tolerated thin liquids via cup and puree without overt s/sx of aspiration. Pt stated that he does not eat without his dentures and he refused more advanced solids. A full liquid diet with thin liquids is recommended at this time. SLP will follow to ensure tolerance and for diet advancement as clinically indicated. ?SLP Visit Diagnosis: Dysphagia, unspecified (R13.10) ?   ?Aspiration Risk ? Mild aspiration risk  ?  ?Diet Recommendation Thin liquid (full liquids)  ? ?Liquid Administration via: Cup;No straw ?Medication Administration: Whole meds with puree (or crushed; as tolerated) ?Supervision: Staff to assist with self feeding ?Compensations: Slow rate;Small sips/bites ?Postural Changes: Seated upright at 90 degrees  ?  ?Other  Recommendations Oral Care Recommendations: Oral care BID   ? ?Recommendations for follow up therapy are one component of a multi-disciplinary discharge planning process, led by the attending physician.  Recommendations may be updated based on patient status, additional functional criteria and insurance authorization. ? ?Follow up Recommendations Acute inpatient rehab (3hours/day)  ? ? ?  ?Assistance Recommended at Discharge    ?Functional Status Assessment Patient has had a recent decline in their functional status and demonstrates the ability to make significant improvements in function in a reasonable and predictable amount of time.  ?Frequency and Duration min 2x/week  ?2 weeks ?  ?   ? ?Prognosis Prognosis for Safe Diet Advancement: Good ?Barriers to Reach Goals: Cognitive deficits  ? ?  ? ?Swallow Study   ?General Date of Onset: 06/03/21 ?HPI: Pt is an 81 y.o. male who presented to the ED 3/31 with SOB and chest pain. Found to have acute RLL segmental and subsegmental PE, acute on chronic congestive heart failure/biventricular failure and severe mitral regurgitation. Pt s/p L radial heart cath 4/5. Pt underwent MitraClip placement 4/20; decreased mental status noted postprocedure  with periods of desaturation into the 60s.  Pt was reintubated in the holding area. ETT 4/20-4/21 (0902). PMH: CAD, NICM, CHF, HTN, OA, obesity. Recent admission 3/25-3/28 for UTI, dehydration and encephalopathy. ?Type of Study: Bedside Swallow Evaluation ?Previous Swallow Assessment: none ?Diet Prior to this Study: NPO ?Temperature Spikes Noted: No ?Respiratory Status: Nasal cannula ?History of Recent Intubation: Yes ?Length of Intubations (days): 1 days ?Date extubated: 06/03/21 ?Behavior/Cognition: Alert;Pleasant mood;Cooperative ?Oral Cavity Assessment: Within Functional Limits ?Oral Care Completed by SLP: No ?Vision: Functional for self-feeding ?Self-Feeding Abilities: Needs assist ?Patient Positioning: Upright in bed;Postural control adequate for testing ?Baseline Vocal Quality: Hoarse ?Volitional Swallow: Able to elicit  ?  ?Oral/Motor/Sensory Function Overall Oral Motor/Sensory Function: Within functional limits   ?Ice Chips Ice chips: Not tested   ?Thin Liquid Thin Liquid: Impaired ?Presentation: Straw;Cup ?Pharyngeal  Phase Impairments: Cough - Immediate  ?  ?Nectar Thick Nectar Thick Liquid: Not tested   ?Honey Thick Honey Thick Liquid: Not tested   ?Puree Puree: Within functional limits ?Presentation: Spoon   ?Solid ? ? ?  Solid: Not tested  ? ?  ?Matthew [name] I. Hardin Negus, [place], CCC-SLP ?Acute Rehabilitation Services ?Office number [phone] ?Pager [phone] ? ?Matthew [name] ?06/03/2021,4:13 PM ? ? ? ? ? ?

## 2021-06-03 NOTE — Progress Notes (Signed)
eLink Physician-Brief Progress Note ?Patient Name: Matthew [name] ?DOB: 1940/09/06 ?MRN: [id number] ? ? ?Date of Service ? 06/03/2021  ?HPI/Events of Note ? CVP 2 and 3 on two separate measurements, SBP 88, patient is oliguric as well, he has been diuresed fairly aggressively recently.  ?eICU Interventions ? 200 ml iv crystalloid bolus ordered to be given over 1 hour.  ? ? ? ?  ? ?Thomasene Lot Hartley Wyke ?06/03/2021, 6:30 AM ?

## 2021-06-03 NOTE — Progress Notes (Addendum)
? ? ?[facility]   ?MULTIDISCIPLINARY HEART VALVE TEAM ? ?Patient Name: Matthew [name] ?Date of Encounter: 06/03/2021 ? ?Primary Cardiologist: [name], MD/ Dr. Haroldine Laws, MD/ Dr. Ali Lowe, MD (TEER) ? ?Hospital Problem List  ?   ?Principal Problem: ?  Acute pulmonary embolism ([place]) ?Active Problems: ?  Acute on chronic congestive heart failure ([place]) ?  Ascending aortic aneurysm ([place]) ?  Acute kidney injury superimposed on chronic kidney disease ([place]) ?  Dyslipidemia ?  Frequent PVCs ?  Protein-calorie malnutrition, severe ?  S/P mitral valve clip implantation ?  Acute respiratory failure ([place]) ?  ?Subjective  ? ?Patient now extubated and doing well. No complaints today. Asking for water/food.  ? ?Inpatient Medications  ?  ?Scheduled Meds: ? amiodarone  400 mg Per Tube BID  ? apixaban  5 mg Per Tube BID  ? atorvastatin  10 mg Per Tube Daily  ? chlorhexidine gluconate (MEDLINE KIT)  15 mL Mouth Rinse BID  ? Chlorhexidine Gluconate Cloth  6 each Topical Daily  ? docusate  100 mg Per Tube BID  ? feeding supplement  237 mL Oral TID BM  ? leptospermum manuka honey  1 application. Topical Daily  ? magnesium oxide  400 mg Per Tube Daily  ? mouth rinse  15 mL Mouth Rinse 10 times per day  ? metoprolol succinate  37.5 mg Oral QHS  ? mexiletine  300 mg Per Tube Q12H  ? midodrine  10 mg Per Tube TID WC  ? mupirocin ointment  1 application. Nasal BID  ? pantoprazole sodium  40 mg Per Tube Daily  ? polyethylene glycol  17 g Per Tube Daily  ? senna  2 tablet Per Tube Daily  ? sodium chloride flush  10-40 mL Intracatheter Q12H  ? sodium chloride flush  3 mL Intravenous Q12H  ? torsemide  40 mg Per Tube Daily  ? ?Continuous Infusions: ? sodium chloride 10 mL/hr at 06/03/21 0600  ? dexmedetomidine (PRECEDEX) IV infusion Stopped (06/03/21 0505)  ? fentaNYL infusion INTRAVENOUS Stopped (06/03/21 0505)  ? midazolam Stopped (06/02/21 2017)  ? milrinone 0.125 mcg/kg/min (06/03/21 0600)  ? ?PRN Meds: ?sodium chloride,  acetaminophen, fentaNYL, hydrALAZINE, labetalol, ondansetron (ZOFRAN) IV, sodium chloride flush, sodium chloride flush, sodium chloride flush, traMADol  ? ?Vital Signs  ?  ?Vitals:  ? 06/03/21 0600 06/03/21 0700 06/03/21 0749 06/03/21 0815  ?BP: (!) 84/59 96/69  95/63  ?Pulse: (!) 57 63  61  ?Resp: _0 ?Temp:   (!) 97.4 ?F (36.3 ?C)   ?TempSrc:   Axillary   ?SpO2: 99% 99%  100%  ?Weight: 85.1 kg     ?Height:      ? ? ?Intake/Output Summary (Last 24 hours) at 06/03/2021 0847 ?Last data filed at 06/03/2021 1025 ?Gross per 24 hour  ?Intake 1174.63 ml  ?Output 1415 ml  ?Net -240.37 ml  ? ?Filed Weights  ? 06/02/21 0441 06/02/21 1016 06/03/21 0600  ?Weight: 83 kg 83 kg 85.1 kg  ? ?Physical Exam  ?  ?General: Elderly, NAD ?Skin: Warm, dry, intact  ?Neck: Negative for carotid bruits. No JVD ?Lungs:Clear to ausculation bilaterally. No wheezes, rales, or rhonchi. Breathing is unlabored. ?Cardiovascular: RRR with S1 S2. + murmur ?Abdomen: Soft, non-tender, non-distended. No obvious abdominal masses. ?Extremities: No edema.  ?Neuro: Alert and oriented to person, situation. No focal deficits. No facial asymmetry. MAE spontaneously. ?Psych: Responds to questions appropriately with normal affect.   ? ?Labs  ?  ?  CBC ?Recent Labs  ?  06/02/21 ?0510 06/02/21 ?1655 06/02/21 ?2006 06/03/21 ?0515  ?WBC 6.8  --   --  7.0  ?HGB 11.0*   < > 14.3 11.7*  ?HCT 34.4*   < > 42.0 35.5*  ?MCV 81.3  --   --  78.7*  ?PLT 167  --   --  167  ? < > = values in this interval not displayed.  ? ?Basic Metabolic Panel ?Recent Labs  ?  06/02/21 ?0510 06/02/21 ?1655 06/02/21 ?2006 06/03/21 ?0515  ?NA 131*   < > 134* 135  ?K 4.1   < > 5.3* 4.7  ?CL 101  --   --  103  ?CO2 22  --   --  19*  ?GLUCOSE 92  --   --  93  ?BUN 39*  --   --  48*  ?CREATININE 2.03*  --   --  2.39*  ?CALCIUM 8.2*  --   --  9.0  ? < > = values in this interval not displayed.  ? ?Liver Function Tests ?No results for input(s): AST, ALT, ALKPHOS, BILITOT, PROT, ALBUMIN in the last  72 hours. ?No results for input(s): LIPASE, AMYLASE in the last 72 hours. ?Cardiac Enzymes ?No results for input(s): CKTOTAL, CKMB, CKMBINDEX, TROPONINI in the last 72 hours. ?BNP ?Invalid input(s): POCBNP ?D-Dimer ?No results for input(s): DDIMER in the last 72 hours. ?Hemoglobin A1C ?No results for input(s): HGBA1C in the last 72 hours. ?Fasting Lipid Panel ?No results for input(s): CHOL, HDL, LDLCALC, TRIG, CHOLHDL, LDLDIRECT in the last 72 hours. ?Thyroid Function Tests ?No results for input(s): TSH, T4TOTAL, T3FREE, THYROIDAB in the last 72 hours. ? ?Invalid input(s): FREET3 ? ?Telemetry  ?  ?NSR with PVCs with HR in the 70-90s- Personally Reviewed ? ?ECG  ?  ?NSR with 1st degree AV block, PVCs, and known RBBB, HR 62bpm  - Personally Reviewed ? ?Radiology  ?  ?DG Chest 2 View ? ?Result Date: 06/01/2021 ?CLINICAL DATA:  Preoperative. EXAM: CHEST - 2 VIEW COMPARISON:  Chest x-ray 05/25/2021. FINDINGS: Right-sided central venous catheter tip projects over the SVC. The cardiomediastinal silhouette is stable, the heart is enlarged. There is no focal lung consolidation, pleural effusion or pneumothorax. There are minimal atelectatic changes in the lung bases. No acute fractures are seen. IMPRESSION: 1. Stable cardiomegaly. 2. No acute cardiopulmonary process. Electronically Signed   By: Ronney Asters M.D.   On: 06/01/2021 23:01  ? ?CARDIAC CATHETERIZATION ? ?Result Date: 06/02/2021 ?Transcatheter edge-to-edge repair of the mitral valve with placement of 2 Mitraclip XTW devices, the first device is A2/P2 and the second device is medial A2/P2. MR reduction from 4+ at baseline to 3+ post-procedure. MR reduction is excellent after the second Clip grasp is made, but MR worsens after the Clip is released. The major portion of the the residual MR jet is between the 2 Clips.  ? ?DG CHEST PORT 1 VIEW ? ?Result Date: 06/03/2021 ?CLINICAL DATA:  Acute respiratory failure. Heart failure. Hypertension. Thoracic aortic aneurysm EXAM:  PORTABLE CHEST 1 VIEW COMPARISON:  Yesterday FINDINGS: Endotracheal tube terminates 6.2 cm above carina. Nasogastric tube extends beyond the inferior aspect of the film. Right internal jugular Cordis sheath unchanged. Right-sided PICC line tip at low SVC. Midline trachea. Mild cardiomegaly. Atherosclerosis in the transverse aorta. No pleural effusion or pneumothorax. No congestive failure. Improved left base aeration with mild atelectasis remaining. There is lucency about the inferolateral left chest wall. IMPRESSION: Significantly improved aeration with nearly  completely resolved left base airspace disease/atelectasis. Cardiomegaly without congestive failure. Lucency about the inferolateral left chest wall. This may be artifactual. If there is a clinical concern of subcutaneous emphysema and/or inferolateral left pneumothorax, consider repeat radiographs versus further evaluation with CT. Aortic Atherosclerosis (ICD10-I70.0). Electronically Signed   By: Abigail Miyamoto M.D.   On: 06/03/2021 08:00  ? ?DG CHEST PORT 1 VIEW ? ?Result Date: 06/02/2021 ?CLINICAL DATA:  Short of breath, central venous catheter placement, pulmonary embolus EXAM: PORTABLE CHEST 1 VIEW COMPARISON:  06/01/2021 FINDINGS: Single frontal view of the chest demonstrates endotracheal tube overlying tracheal air column tip just below thoracic inlet. Right internal jugular catheter tip projects over the superior vena cava. Stable right-sided PICC. Two radiodensities are seen projecting over the inferior heart border, likely surgical clips and new since prior exam. Cardiac silhouette is enlarged. Increased central vascular congestion. Increased density at the left lung base consistent with consolidation and likely small effusion. No pneumothorax. IMPRESSION: 1. Support devices as above. 2. Enlarged cardiac silhouette. 3. Central vascular congestion, with likely left basilar atelectasis and small effusion. Electronically Signed   By: Randa Ngo M.D.    On: 06/02/2021 17:02  ? ?DG Abd Portable 1V ? ?Result Date: 06/02/2021 ?CLINICAL DATA:  Orogastric tube placement. EXAM: PORTABLE ABDOMEN - 1 VIEW COMPARISON:  None. FINDINGS: The tip of the [place]

## 2021-06-03 NOTE — Consult Note (Signed)
? ?NAME:  Matthew [name], MRN:  [id number], DOB:  1940-04-12, LOS: 72 ?ADMISSION DATE:  05/13/2021, CONSULTATION DATE:  4/20 ?REFERRING MD:  Dr. Cathlean Sauer, CHIEF COMPLAINT:  Desaturations post procedure  ? ?History of Present Illness:  ?81 y/o M who presented to [facility] on 3/31 with chest pain and shortness of breath.   ? ?He was recently admitted from 3/25-3/28 for UTI, dehydration and encephalopathy.  He was seen by Cardiology on 3/31 with concern for worsening biventricular function / volume overload with plan for repeat R/L heart cath and TEE.  His lasix dosing was adjusted at that time.  ? ?He returned to the ER on 3/31 with reports of rather acute onset shortness of breath. He was admitted by [facility] for further care. Work up was consistent with an acute RLL segmental and subsegmental PE, acute on chronic congestive heart failure / biventricular failure and severe mitral regurgitation. Cardiology was consulted.  He was treated with IV heparin for acute PE. He was diuresed.  GDMT was initially unable to be instituted due to hypotension requiring midodrine. Further cardiac work up revealed severe central & posterior leaflet MR.  He underwent RHC 4/5 with low filling pressures with normal CO.  He was evaluated by the Structural Heart Team and recommended for MitraClip.  He was started on milrinone 4/12 and this was discontinued 4/16.  Co-ox was ow on 4/19 and milrinone was restarted. Clinical course complicated by AKI on CKD, PVC's and known aortic aneurysm of 4.5 cm.  ? ?On 4/20 he underwent MitraClip placement reducing mitral regurgitation from 4+ to 3+.  Postprocedure he was transported to the Cath Lab holding area but was noted to have decreased mental status and periods of desaturation into the 60s.  The patient was reintubated in the holding area.  Post intubation x-ray showed small effusion and left basilar atelectasis.  Subsequent ABG reflected acute hypercarbia with pH of 7.2/PCO2 60. ? ?PCCM consulted for  assistance with ICU care. ?Pertinent  Medical History  ?CAD ?Chronic heart failure with reduced EF ?HTN ?HLD ?NICM ?Severe mitral regurgitation s/p MitraClip ?Thoracic aortic aneurysm ?Obesity ?Osteoarthritis ?Tricuspid regurgitation ?Thrombocytopenia ? ?Significant Hospital Events: ?Including procedures, antibiotic start and stop dates in addition to other pertinent events   ?3/31 Admit ?4/20 MitraClip placement.  Developed respiratory failure postprocedure requiring reintubation. ?4/21 plans for extubation  ? ?Interim History / Subjective:  ? ?Remains critically ill intubated on life support.  ? ?Objective   ?Blood pressure 95/63, pulse 61, temperature (!) 97.4 ?F (36.3 ?C), temperature source Axillary, resp. rate 16, height 6\' 7"  (2.007 m), weight 85.1 kg, SpO2 100 %. ?CVP:  [0 mmHg-6 mmHg] 0 mmHg  ?Vent Mode: PSV;CPAP ?FiO2 (%):  [40 %-100 %] 40 % ?Set Rate:  [20 bmp] 20 bmp ?Vt Set:  [id number] mL] 660 mL ?PEEP:  [5 cmH20] 5 cmH20 ?Pressure Support:  [8 cmH20] 8 cmH20 ?Plateau Pressure:  [23 cmH20-29 cmH20] 23 cmH20  ? ?Intake/Output Summary (Last 24 hours) at 06/03/2021 0841 ?Last data filed at 06/03/2021 [id number] ?Gross per 24 hour  ?Intake 1174.63 ml  ?Output 1415 ml  ?Net -240.37 ml  ? ?Filed Weights  ? 06/02/21 0441 06/02/21 1016 06/03/21 0600  ?Weight: 83 kg 83 kg 85.1 kg  ? ? ?Examination: ?General: elderly life support, intubated on lifesupport, remains on vent  ?HENT: MM dry, ett in place  ?Lungs: Bilateral vented breaths  ?Cardiovascular: RRR, s1 s2  ?Abdomen: soft nt nd  ?Extremities: warm dry, no edema  ?Neuro:  minimally sedate, following commands on support  ?  ? ?Resolved Hospital Problem list   ?  ? ?Assessment & Plan:  ? ?Acute Hypercarbic / Hypoxemic Respiratory Failure  ?Suspect patient slow to wake post sedation, hypercarbic  ?Postop respiratory insufficiency, now resolved ?Plan: ?SAT SBT this morning. ?Hold sedation. ?Hopeful for extubation. ? ?Acute on Chronic BiVentricular Heart Failure  ?NSVT/PVC's   ?Ectopy thought to be contributing to CM with goal for suppression  ?Plan: ?Continue heart failure medications per heart failure team ?Remains on amiodarone, Entresto, diuretics ?Continue Eliquis for anticoagulation ?Continue midodrine ? ?CAD ?HLD  ?Non-obstructive on LHC  ?Plan: ?Continue statin ? ?RLL Acute Pulmonary Embolism  ?Segmental & subsegemental  ?Continue Eliquis ? ?AKI on CKD IIIa ?Baseline sr cr 1.4-17 ?Plan: ?Follow urine output, replace electrolytes as needed ?Avoid nephrotoxic agents ? ?Ascending Aortic Aneurysm  ?-Outpatient follow-up ? ?At Risk Malnutrition ?Plan: ?Bedside swallow and advance diet as tolerated ? ?Deconditioning  ?Plan: ?PT OT evaluation ? ?Constipation  ?-bowel regimen  ?-early mobilization as able  ? ?Best Practice (right click and "Reselect all SmartList Selections" daily)  ?Diet/type: NPO ?DVT prophylaxis: other ?GI prophylaxis: PPI ?Lines: Central line ?Foley:  N/A ?Code Status:  full code ?Last date of multidisciplinary goals of care discussion: full code. Further discussion pending.  ? ?Labs   ?CBC: ?Recent Labs  ?Lab 05/30/21 ?0505 06/02/21 ?0510 06/02/21 ?1655 06/02/21 ?2006 06/03/21 ?0515  ?WBC 5.7 6.8  --   --  7.0  ?HGB 12.3* 11.0* 14.6 14.3 11.7*  ?HCT 37.8* 34.4* 43.0 42.0 35.5*  ?MCV 78.8* 81.3  --   --  78.7*  ?PLT 197 167  --   --  167  ? ? ?Basic Metabolic Panel: ?Recent Labs  ?Lab 05/28/21 ?0435 05/29/21 ?0725 05/30/21 ?0505 05/31/21 ?[id number] 06/01/21 ?[id number] 06/02/21 ?0510 06/02/21 ?1655 06/02/21 ?2006 06/03/21 ?0515  ?NA 133* 135 132* 132* 134* 131* 135 134* 135  ?K 4.5 4.5 4.9 4.9 5.1 4.1 5.3* 5.3* 4.7  ?CL 100 100 98 95* 98 101  --   --  103  ?CO2 27 27 26 27 27 22   --   --  19*  ?GLUCOSE 88 78 83 85 98 92  --   --  93  ?BUN 38* 37* 43* 39* 42* 39*  --   --  48*  ?CREATININE 2.35* 2.19* 2.11* 2.16* 2.44* 2.03*  --   --  2.39*  ?CALCIUM 9.0 8.9 9.2 9.4 9.5 8.2*  --   --  9.0  ?MG 2.1 1.8 1.9  --   --   --   --   --   --   ? ?GFR: ?Estimated Creatinine Clearance:  29.7 mL/min (A) (by C-G formula based on SCr of 2.39 mg/dL (H)). ?Recent Labs  ?Lab 05/30/21 ?0505 06/02/21 ?0510 06/03/21 ?0515  ?WBC 5.7 6.8 7.0  ? ? ?Liver Function Tests: ?No results for input(s): AST, ALT, ALKPHOS, BILITOT, PROT, ALBUMIN in the last 168 hours. ?No results for input(s): LIPASE, AMYLASE in the last 168 hours. ?No results for input(s): AMMONIA in the last 168 hours. ? ?ABG ?   ?Component Value Date/Time  ? PHART 7.502 (H) 06/02/2021 2006  ? PCO2ART 31.8 (L) 06/02/2021 2006  ? PO2ART 464 (H) 06/02/2021 2006  ? HCO3 25.0 06/02/2021 2006  ? TCO2 26 06/02/2021 2006  ? ACIDBASEDEF 2.0 06/02/2021 1655  ? O2SAT 78.3 06/03/2021 0515  ?  ? ?Coagulation Profile: ?No results for input(s): INR, PROTIME in the last 168 hours. ? ?  Cardiac Enzymes: ?No results for input(s): CKTOTAL, CKMB, CKMBINDEX, TROPONINI in the last 168 hours. ? ?HbA1C: ?Hgb A1c MFr Bld  ?Date/Time Value Ref Range Status  ?05/07/2021 09:30 PM 5.8 (H) 4.8 - 5.6 % Final  ?  Comment:  ?  (NOTE) ?Pre diabetes:          5.7%-6.4% ? ?Diabetes:              >6.4% ? ?Glycemic control for   <7.0% ?adults with diabetes ?  ? ? ?CBG: ?Recent Labs  ?Lab 06/02/21 ?2004 06/02/21 ?2350  ?GLUCAP 103* 100*  ? ? ?Review of Systems:   ?Unable to complete as patient is altered on mechanical ventilation  ? ?Past Medical History:  ?He,  has a past medical history of CAD (coronary artery disease), Chronic HFrEF (heart failure with reduced ejection fraction) ([place]), Chronic kidney disease, stage 3b ([place]), Encephalopathy (04/2021), HTN (hypertension), Hyperlipidemia, NICM (nonischemic cardiomyopathy) ([place]), Obesity, Osteoarthritis, S/P mitral valve clip implantation (06/02/2021), Severe mitral regurgitation, Thoracic aortic aneurysm (TAA) ([place]), Thrombocytopenia ([place]), and Tricuspid regurgitation.  ? ?Surgical History:  ? ?Past Surgical History:  ?Procedure Laterality Date  ? CHONDROPLASTY Left 01/11/2017  ? Procedure: CHONDROPLASTY of medial and patella compartment;   Surgeon: Dorna Leitz, MD;  Location: [facility];  Service: Orthopedics;  Laterality: Left;  ? KNEE ARTHROSCOPY Left 01/11/2017  ? Procedure: ARTHROSCOPIC IRRIGATION AND DEBRIDMENT KNEE;  Surgeon: Dorna Leitz, MD;

## 2021-06-03 NOTE — Progress Notes (Addendum)
? ? Advanced Heart Failure Rounding Note ? ?PCP-Cardiologist: Larae Grooms, MD  ? ?Subjective:   ? ?04/05: RHC Low filling pressures with normal CO.  ?04/06: Seen by structural team for consideration of mitral TEER ?04/12: Started on milrinone 0.125, lactic acid 2.8>>1.7>1.6 ?04/16: Milrinone discontinued  ?04/19: CO-OX low. Milrinone 0.125 mcg restarted.  ?0/4/20: S/P MTEER ? ?CO-OX 78% on Milrinone 0.125 mcg.  ? ?Remains intubated. Awake following commands.  ? ? ?Objective:   ?Weight Range: ?85.1 kg ?Body mass index is 21.14 kg/m?.  ? ?Vital Signs:   ?Temp:  [97.4 ?F (36.3 ?C)-98 ?F (36.7 ?C)] 97.4 ?F (36.3 ?C) (04/21 0749) ?Pulse Rate:  [57-106] 61 (04/21 0815) ?Resp:  [15-31] 16 (04/21 0815) ?BP: (80-158)/(55-109) 95/63 (04/21 0815) ?SpO2:  [69 %-100 %] 100 % (04/21 0815) ?Arterial Line BP: (92-162)/(50-93) 102/51 (04/21 0700) ?FiO2 (%):  [40 %-100 %] 40 % (04/21 0815) ?Weight:  [83 kg-85.1 kg] 85.1 kg (04/21 0600) ?Last BM Date : 06/01/21 ? ?Weight change: ?Filed Weights  ? 06/02/21 0441 06/02/21 1016 06/03/21 0600  ?Weight: 83 kg 83 kg 85.1 kg  ? ? ?Intake/Output:  ? ?Intake/Output Summary (Last 24 hours) at 06/03/2021 0850 ?Last data filed at 06/03/2021 [id number] ?Gross per 24 hour  ?Intake 1174.63 ml  ?Output 1415 ml  ?Net -240.37 ml  ? ?  ? ? ?Physical Exam  ?CVP 4  ?General:  Intubated.  ?HEENT: ETT ?Neck: supple. no JVD. Carotids 2+ bilat; no bruits. No lymphadenopathy or thryomegaly appreciated. ?Cor: PMI nondisplaced. Regular rate & rhythm. No rubs, gallops .3/6 MR  ?Lungs: clear ?Abdomen: soft, nontender, nondistended. No hepatosplenomegaly. No bruits or masses. Good bowel sounds. ?Extremities: no cyanosis, clubbing, rash, edema. RUE PICC  ?Neuro:Alert. Follows commands. MAE x4 ? ?SR 50-70s Frequent PVCs  ?Labs  ?  ?CBC ?Recent Labs  ?  06/02/21 ?0510 06/02/21 ?1655 06/02/21 ?2006 06/03/21 ?0515  ?WBC 6.8  --   --  7.0  ?HGB 11.0*   < > 14.3 11.7*  ?HCT 34.4*   < > 42.0 35.5*  ?MCV 81.3  --   --  78.7*   ?PLT 167  --   --  167  ? < > = values in this interval not displayed.  ? ? ?Basic Metabolic Panel ?Recent Labs  ?  06/02/21 ?0510 06/02/21 ?1655 06/02/21 ?2006 06/03/21 ?0515  ?NA 131*   < > 134* 135  ?K 4.1   < > 5.3* 4.7  ?CL 101  --   --  103  ?CO2 22  --   --  19*  ?GLUCOSE 92  --   --  93  ?BUN 39*  --   --  48*  ?CREATININE 2.03*  --   --  2.39*  ?CALCIUM 8.2*  --   --  9.0  ? < > = values in this interval not displayed.  ? ?Liver Function Tests ?No results for input(s): AST, ALT, ALKPHOS, BILITOT, PROT, ALBUMIN in the last 72 hours. ?No results for input(s): LIPASE, AMYLASE in the last 72 hours. ?Cardiac Enzymes ?No results for input(s): CKTOTAL, CKMB, CKMBINDEX, TROPONINI in the last 72 hours. ? ?BNP: ?BNP (last 3 results) ?Recent Labs  ?  05/09/21 ?[id number] 05/10/21 ?0205 05/13/21 ?2142  ?BNP >4,500.0* >4,500.0* >4,500.0*  ? ? ?ProBNP (last 3 results) ?No results for input(s): PROBNP in the last 8760 hours. ? ? ?D-Dimer ?No results for input(s): DDIMER in the last 72 hours. ?Hemoglobin A1C ?No results for input(s): HGBA1C in the last  72 hours. ?Fasting Lipid Panel ?No results for input(s): CHOL, HDL, LDLCALC, TRIG, CHOLHDL, LDLDIRECT in the last 72 hours. ?Thyroid Function Tests ?No results for input(s): TSH, T4TOTAL, T3FREE, THYROIDAB in the last 72 hours. ? ?Invalid input(s): FREET3 ? ?Other results: ? ? ?Imaging  ? ? ?CARDIAC CATHETERIZATION ? ?Result Date: 06/02/2021 ?Transcatheter edge-to-edge repair of the mitral valve with placement of 2 Mitraclip XTW devices, the first device is A2/P2 and the second device is medial A2/P2. MR reduction from 4+ at baseline to 3+ post-procedure. MR reduction is excellent after the second Clip grasp is made, but MR worsens after the Clip is released. The major portion of the the residual MR jet is between the 2 Clips.  ? ?DG CHEST PORT 1 VIEW ? ?Result Date: 06/03/2021 ?CLINICAL DATA:  Acute respiratory failure. Heart failure. Hypertension. Thoracic aortic aneurysm EXAM:  PORTABLE CHEST 1 VIEW COMPARISON:  Yesterday FINDINGS: Endotracheal tube terminates 6.2 cm above carina. Nasogastric tube extends beyond the inferior aspect of the film. Right internal jugular Cordis sheath unchanged. Right-sided PICC line tip at low SVC. Midline trachea. Mild cardiomegaly. Atherosclerosis in the transverse aorta. No pleural effusion or pneumothorax. No congestive failure. Improved left base aeration with mild atelectasis remaining. There is lucency about the inferolateral left chest wall. IMPRESSION: Significantly improved aeration with nearly completely resolved left base airspace disease/atelectasis. Cardiomegaly without congestive failure. Lucency about the inferolateral left chest wall. This may be artifactual. If there is a clinical concern of subcutaneous emphysema and/or inferolateral left pneumothorax, consider repeat radiographs versus further evaluation with CT. Aortic Atherosclerosis (ICD10-I70.0). Electronically Signed   By: Abigail Miyamoto M.D.   On: 06/03/2021 08:00  ? ?DG CHEST PORT 1 VIEW ? ?Result Date: 06/02/2021 ?CLINICAL DATA:  Short of breath, central venous catheter placement, pulmonary embolus EXAM: PORTABLE CHEST 1 VIEW COMPARISON:  06/01/2021 FINDINGS: Single frontal view of the chest demonstrates endotracheal tube overlying tracheal air column tip just below thoracic inlet. Right internal jugular catheter tip projects over the superior vena cava. Stable right-sided PICC. Two radiodensities are seen projecting over the inferior heart border, likely surgical clips and new since prior exam. Cardiac silhouette is enlarged. Increased central vascular congestion. Increased density at the left lung base consistent with consolidation and likely small effusion. No pneumothorax. IMPRESSION: 1. Support devices as above. 2. Enlarged cardiac silhouette. 3. Central vascular congestion, with likely left basilar atelectasis and small effusion. Electronically Signed   By: Randa Ngo M.D.    On: 06/02/2021 17:02  ? ?DG Abd Portable 1V ? ?Result Date: 06/02/2021 ?CLINICAL DATA:  Orogastric tube placement. EXAM: PORTABLE ABDOMEN - 1 VIEW COMPARISON:  None. FINDINGS: The tip of the orogastric tube is in the proximal body of the stomach with side hole at the level of the gastroesophageal junction. There is gaseous distention of bowel in the upper abdomen. IMPRESSION: 1. Orogastric tube tip in the proximal body of the stomach with side hole at the level of the gastroesophageal junction. Recommend advancing tube. Electronically Signed   By: Ronney Asters M.D.   On: 06/02/2021 20:47  ? ?ECHO TEE ? ?Result Date: 06/02/2021 ?   TRANSESOPHOGEAL ECHO REPORT   Patient Name:   Matthew [name] Date of Exam: 06/02/2021 Medical Rec #:  [id number]        Height:       79.0 in Accession #:    [id number]       Weight:       183.0 lb Date of Birth:  1940-10-27        BSA:          2.194 m? Patient Age:    30 years         BP:           143/77 mmHg Patient Gender: M                HR:           85 bpm. Exam Location:  Inpatient Procedure: Transesophageal Echo, 3D Echo, Color Doppler and Cardiac Doppler Indications:     Mitral Regurgitation  History:         Patient has prior history of Echocardiogram examinations, most                  recent 05/31/2021. CHF; Risk Factors:Hypertension and                  Dyslipidemia.  Sonographer:     Raquel Sarna Senior RDCS Referring Phys:  [id number] Woodfin Ganja THOMPSON Diagnosing Phys: Jenkins Rouge MD  Sonographer Comments: 2 Mitraclips Implanted PROCEDURE: After discussion of the risks and benefits of a TEE, an informed consent was obtained from the patient. The transesophogeal probe was passed without difficulty through the esophogus of the patient. Sedation performed by different physician. The patient was monitored while under deep sedation. The patient developed no complications during the procedure. IMPRESSIONS  1. Left ventricular ejection fraction, by estimation, is 20 to 25%. The left  ventricle has severely decreased function. The left ventricle demonstrates global hypokinesis. The left ventricular internal cavity size was severely dilated.  2. Right ventricular systolic function is sev

## 2021-06-03 NOTE — Progress Notes (Signed)
Inpatient Rehab Admissions Coordinator:  Await medical clearance and bed availability. Will continue to follow.   Jae Skeet Graves Madden, MS, CCC-SLP Admissions Coordinator [phone]  

## 2021-06-03 NOTE — Progress Notes (Signed)
?  Echocardiogram ?2D Echocardiogram has been performed. ? ?Matthew [name] ?06/03/2021, 3:20 PM ?

## 2021-06-03 NOTE — Procedures (Signed)
Extubation Procedure Note ? ?Patient Details:   ?Name: Matthew [name] ?DOB: 06-01-1940 ?MRN: [id number] ?  ?Airway Documentation:  ?  ?Vent end date: 06/03/21 Vent end time: 0902  ? ?Evaluation ? O2 sats: stable throughout ?Complications: No apparent complications ?Patient did tolerate procedure well. ?Bilateral Breath Sounds: Clear, Diminished ?  ?Yes ?Pt is able to speak and state name. Pt is currently on 4L Nasal cannula and is stable. No signs of stridor and audible cuffleak was heard prior extubation. ? ?Jolayne Panther ?06/03/2021, 9:07 AM ? ?

## 2021-06-04 DIAGNOSIS — I5043 Acute on chronic combined systolic (congestive) and diastolic (congestive) heart failure: Secondary | ICD-10-CM | POA: Diagnosis not present

## 2021-06-04 LAB — MAGNESIUM: Magnesium: 2.3 mg/dL (ref 1.7–2.4)

## 2021-06-04 LAB — CBC
HCT: 35.6 % — ABNORMAL LOW (ref 39.0–52.0)
Hemoglobin: 11.1 g/dL — ABNORMAL LOW (ref 13.0–17.0)
MCH: 25.9 pg — ABNORMAL LOW (ref 26.0–34.0)
MCHC: 31.2 g/dL (ref 30.0–36.0)
MCV: 83 fL (ref 80.0–100.0)
Platelets: 142 10*3/uL — ABNORMAL LOW (ref 150–400)
RBC: 4.29 MIL/uL (ref 4.22–5.81)
RDW: 21.1 % — ABNORMAL HIGH (ref 11.5–15.5)
WBC: 8.2 10*3/uL (ref 4.0–10.5)
nRBC: 0 % (ref 0.0–0.2)

## 2021-06-04 LAB — BASIC METABOLIC PANEL
Anion gap: 8 (ref 5–15)
BUN: 57 mg/dL — ABNORMAL HIGH (ref 8–23)
CO2: 24 mmol/L (ref 22–32)
Calcium: 8.9 mg/dL (ref 8.9–10.3)
Chloride: 101 mmol/L (ref 98–111)
Creatinine, Ser: 2.63 mg/dL — ABNORMAL HIGH (ref 0.61–1.24)
GFR, Estimated: 24 mL/min — ABNORMAL LOW (ref >60)
Glucose, Bld: 133 mg/dL — ABNORMAL HIGH (ref 70–99)
Potassium: 4.8 mmol/L (ref 3.5–5.1)
Sodium: 133 mmol/L — ABNORMAL LOW (ref 135–145)

## 2021-06-04 LAB — COOXEMETRY PANEL
Carboxyhemoglobin: 0.6 % (ref 0.5–1.5)
Methemoglobin: 2.3 % — ABNORMAL HIGH (ref 0.0–1.5)
O2 Saturation: 76.3 %
Total hemoglobin: 11.9 g/dL — ABNORMAL LOW (ref 12.0–16.0)

## 2021-06-04 MED ORDER — MIDODRINE HCL 5 MG PO TABS
15.0000 mg | ORAL_TABLET | Freq: Three times a day (TID) | ORAL | Status: DC
Start: 2021-06-04 — End: 2021-06-08
  Administered 2021-06-04 – 2021-06-08 (×13): 15 mg via ORAL
  Filled 2021-06-04 (×13): qty 3

## 2021-06-04 NOTE — Progress Notes (Signed)
Patient ID: Matthew [name], male   DOB: [birth year], 81 y.o.   MRN: [id number] ?  ? ? Advanced Heart Failure Rounding Note ? ?PCP-Cardiologist: Larae Grooms, MD  ? ?Subjective:   ? ?04/05: RHC Low filling pressures with normal CO.  ?04/06: Seen by structural team for consideration of mitral TEER ?04/12: Started on milrinone 0.125, lactic acid 2.8>>1.7>1.6 ?04/16: Milrinone discontinued  ?04/19: CO-OX low. Milrinone 0.125 mcg restarted.  ?04/20: S/P MTEER ?04/21: Extubated ? ?CO-OX 76% on Milrinone 0.125 mcg/kg/min.  Creatinine up to 2.63.  CVP 5-6.  4L oxygen by [place].  ? ?He is sitting in chair, follows commands. Hard of hearing.  ? ? ?Objective:   ?Weight Range: ?84.5 kg ?Body mass index is 20.99 kg/m?.  ? ?Vital Signs:   ?Temp:  [97.4 ?F (36.3 ?C)-97.6 ?F (36.4 ?C)] 97.6 ?F (36.4 ?C) (04/21 1600) ?Pulse Rate:  [55-90] 72 (04/22 0700) ?Resp:  [14-24] 24 (04/22 0700) ?BP: (83-109)/(60-73) 100/67 (04/22 0700) ?SpO2:  [94 %-100 %] 100 % (04/22 0700) ?Arterial Line BP: (100-119)/(44-66) 118/66 (04/21 1300) ?FiO2 (%):  [40 %] 40 % (04/21 0815) ?Weight:  [84.5 kg] 84.5 kg (04/22 0600) ?Last BM Date : 06/04/21 ? ?Weight change: ?Filed Weights  ? 06/02/21 1016 06/03/21 0600 06/04/21 0600  ?Weight: 83 kg 85.1 kg 84.5 kg  ? ? ?Intake/Output:  ? ?Intake/Output Summary (Last 24 hours) at 06/04/2021 0744 ?Last data filed at 06/04/2021 0600 ?Gross per 24 hour  ?Intake 868.93 ml  ?Output 866 ml  ?Net 2.93 ml  ? ?  ? ? ?Physical Exam  ?CVP 5-6 ?General: NAD ?Neck: No JVD, no thyromegaly or thyroid nodule.  ?Lungs: Clear to auscultation bilaterally with normal respiratory effort. ?CV: Nondisplaced PMI.  Heart regular S1/S2, no S3/S4, 2/6 HSM apex.  No peripheral edema.   ?Abdomen: Soft, nontender, no hepatosplenomegaly, no distention.  ?Skin: Intact without lesions or rashes.  ?Neurologic: Alert and oriented x 3.  ?Psych: Normal affect. ?Extremities: No clubbing or cyanosis.  ?HEENT: Normal.  ? ?Labs  ?  ?CBC ?Recent Labs  ?   06/03/21 ?2671 06/04/21 ?0456  ?WBC 7.0 8.2  ?HGB 11.7* 11.1*  ?HCT 35.5* 35.6*  ?MCV 78.7* 83.0  ?PLT 167 142*  ? ? ?Basic Metabolic Panel ?Recent Labs  ?  06/03/21 ?2458 06/04/21 ?0456  ?NA 135 133*  ?K 4.7 4.8  ?CL 103 101  ?CO2 19* 24  ?GLUCOSE 93 133*  ?BUN 48* 57*  ?CREATININE 2.39* 2.63*  ?CALCIUM 9.0 8.9  ?MG  --  2.3  ? ?Liver Function Tests ?No results for input(s): AST, ALT, ALKPHOS, BILITOT, PROT, ALBUMIN in the last 72 hours. ?No results for input(s): LIPASE, AMYLASE in the last 72 hours. ?Cardiac Enzymes ?No results for input(s): CKTOTAL, CKMB, CKMBINDEX, TROPONINI in the last 72 hours. ? ?BNP: ?BNP (last 3 results) ?Recent Labs  ?  05/09/21 ?0998 05/10/21 ?0205 05/13/21 ?2142  ?BNP >4,500.0* >4,500.0* >4,500.0*  ? ? ?ProBNP (last 3 results) ?No results for input(s): PROBNP in the last 8760 hours. ? ? ?D-Dimer ?No results for input(s): DDIMER in the last 72 hours. ?Hemoglobin A1C ?No results for input(s): HGBA1C in the last 72 hours. ?Fasting Lipid Panel ?No results for input(s): CHOL, HDL, LDLCALC, TRIG, CHOLHDL, LDLDIRECT in the last 72 hours. ?Thyroid Function Tests ?No results for input(s): TSH, T4TOTAL, T3FREE, THYROIDAB in the last 72 hours. ? ?Invalid input(s): FREET3 ? ?Other results: ? ? ?Imaging  ? ? ?No results found. ? ? ?Medications:   ? ? ?  Scheduled Medications: ? amiodarone  400 mg Oral BID  ? apixaban  5 mg Oral BID  ? atorvastatin  10 mg Per Tube Daily  ? chlorhexidine gluconate (MEDLINE KIT)  15 mL Mouth Rinse BID  ? Chlorhexidine Gluconate Cloth  6 each Topical Daily  ? docusate sodium  100 mg Oral BID  ? feeding supplement  237 mL Oral TID BM  ? leptospermum manuka honey  1 application. Topical Daily  ? magnesium oxide  400 mg Oral Daily  ? mexiletine  300 mg Oral Q12H  ? midodrine  15 mg Oral TID WC  ? mupirocin ointment  1 application. Nasal BID  ? pantoprazole  40 mg Oral Daily  ? polyethylene glycol  17 g Oral Daily  ? senna  2 tablet Oral Daily  ? sodium chloride flush  10-40  mL Intracatheter Q12H  ? sodium chloride flush  3 mL Intravenous Q12H  ? ? ?Infusions: ? sodium chloride 10 mL/hr at 06/04/21 0400  ? fentaNYL infusion INTRAVENOUS Stopped (06/03/21 0505)  ? milrinone 0.125 mcg/kg/min (06/04/21 0400)  ? ? ?PRN Medications: ?sodium chloride, acetaminophen, fentaNYL, hydrALAZINE, labetalol, ondansetron (ZOFRAN) IV, sodium chloride flush, sodium chloride flush, sodium chloride flush, traMADol ? ? ? ?Patient Profile  ? ?81 y.o. male with history of chronic biventricular HF, NICM, CAD, severe MR/TR, HTN, obesity and multiple recent admissions, now admitted w/ acute PE and a/c CHF.  ? ?Echo this admission, EF 25%, mildly dilated RV with moderately decreased systolic function, severe MR and TR. IVC dilated.  ? ?Assessment/Plan  ? ?1. Acute on chronic Biventricular HF   ?- ECHO 04/2020 EF 25-30% with mild MR.   ?- Echo 04/04/21 EF LVEF 20-25%, RV moderately reduced, LA/RA severely dilated, and severe MR.  ?- Echo per Dr. 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 ?- cMRI c/w severe NICM w/ basal septal midwall LGE  ?- Elevated lactic acid 2.8 on 04/12. Started on milrinone 0.125 with improvement in lactic acidosis. Milrinone discontinued 4/16. Milrinone restarted 4/19. Improved CO-OX  ?- CO-Ox 76% but creatinine rising again, now up to 2.63. Continue milrinone 0.125 mcg/kg/min today until creatinine stabilizes.  ?- Volume status stable. CVP 5-6. Off diuretics for now.  ?- GDMT limited d/t hypotension and AKI. Increase midodrine to 15 mg TID with soft BP to help w/ renal perfusion  ?- Stop Toprol XL for now with soft BP.   ?- Off Spiro w/ elevated K and SCr  ?- Still with  frequent PVCs. Have discussed with EP. He is not a candidate for PVC ablation.  ?- S/P MTEER but still with 3+ MR.  ? ?2. Lactic acidosis ?- Suspect d/t low-output HF ?- Resolved with milrinone ?- BCx NGTD procalcitonin < 0.10 X 2 ?- No leukocytosis, AF, CXR 04/12 with no acute process ?- Milrinone stopped 4/16 . CO-OX down so milrinone restarted and continues today.    ? ?3. CAD ?- LHC w/ mild non-obstructive CAD ?- No chest pain.  ?- Continue statin ?  ?4. Mitral Regurgitation ?- Severe on echo this admit and last echo 02/23 ?- TEE showed mild restriction of P2. Severe central/posterior MR due to P2 restriction and annular dilation ?- functional,  severely dilated LA and LV  ?- Seen by structural team.  ?- S/P MTEER => still with 3+ MR.  ?  ?5. Tricuspid Regurgitation  ?- moderate on TEE  ?- functional, severely dilated RA and RV   ?- HF optimization per above  ?  ?6. Acute PE ?- On Eliquis.  ?- No evidence of DVT on [place] ?- ? 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  ?7. Hyperlipidemia ?- Continue statin ?  ?8. AKI on CKD IIIa ?- Scr  baseline 1.4-1.7 last few months, this admit 1.4>>1.76>2.02>>>2.30>2.17 > 2.35> 2.19>2.11>2.16>2.44 >2.03 ->2.4->2.64 ?Suspect cardiorenal syndrome. ?-Improving with inotrope.  ?  ?9. NSVT/PVCs ?- suspect contributing to CM , need to suppress. ?- Ongoing high PVC burden. ?- continue mexiletine 300 mg BID  ?- seen by EP 04/12, amiodarone increased to 400 mg BID. See above ?- Keep K > 4 and Mag > 2 ?- per EP, he is not an ablation candidate ? ?10. Hyponatremia ?- limit FW ?- Na 133 ? ?11. Hyperkalemia ?- Received Lokelma. Resolved. ?- K 4.8 today.  ? ?12. Debility ?- Planning for CIR when medically ready ?- Mobilize today.  ? ?Loralie Champagne ?06/04/2021 ?7:44 AM ? ? ? ?

## 2021-06-04 NOTE — Progress Notes (Signed)
Pt arrived to the unit. Placed on tele, milrinone infusing, CVP set up. Oriented to the room. Educated pt.to the room. Call bell and personal belongings at the bedside ? ?

## 2021-06-04 NOTE — Progress Notes (Signed)
PCCM: ? ?Patient doing well post extubation  ?Case discussed with Dr. Shirlee Latch who will accept as transfer to HF service.  ?Orders placed for Transfer to cardiac progressive  ?PCCM will sign off ? ?Josephine Igo, DO ?[facility] ?06/04/2021 9:44 AM   ? ? ?

## 2021-06-05 DIAGNOSIS — N179 Acute kidney failure, unspecified: Secondary | ICD-10-CM | POA: Diagnosis not present

## 2021-06-05 DIAGNOSIS — I5023 Acute on chronic systolic (congestive) heart failure: Secondary | ICD-10-CM | POA: Diagnosis not present

## 2021-06-05 DIAGNOSIS — N189 Chronic kidney disease, unspecified: Secondary | ICD-10-CM | POA: Diagnosis not present

## 2021-06-05 LAB — BASIC METABOLIC PANEL
Anion gap: 8 (ref 5–15)
BUN: 59 mg/dL — ABNORMAL HIGH (ref 8–23)
CO2: 25 mmol/L (ref 22–32)
Calcium: 9.3 mg/dL (ref 8.9–10.3)
Chloride: 102 mmol/L (ref 98–111)
Creatinine, Ser: 2.31 mg/dL — ABNORMAL HIGH (ref 0.61–1.24)
GFR, Estimated: 28 mL/min — ABNORMAL LOW (ref >60)
Glucose, Bld: 95 mg/dL (ref 70–99)
Potassium: 5 mmol/L (ref 3.5–5.1)
Sodium: 135 mmol/L (ref 135–145)

## 2021-06-05 LAB — COOXEMETRY PANEL
Carboxyhemoglobin: 2 % — ABNORMAL HIGH (ref 0.5–1.5)
Methemoglobin: <0.7 % (ref 0.0–1.5)
O2 Saturation: 73.5 %
Total hemoglobin: 11.9 g/dL — ABNORMAL LOW (ref 12.0–16.0)

## 2021-06-05 MED ORDER — TORSEMIDE 20 MG PO TABS
40.0000 mg | ORAL_TABLET | Freq: Every day | ORAL | Status: DC
Start: 2021-06-05 — End: 2021-06-08
  Administered 2021-06-05 – 2021-06-08 (×4): 40 mg via ORAL
  Filled 2021-06-05 (×4): qty 2

## 2021-06-05 NOTE — Plan of Care (Signed)
  Problem: Education: Goal: Knowledge of General Education information will improve Description: Including pain rating scale, medication(s)/side effects and non-pharmacologic comfort measures Outcome: Progressing   Problem: Health Behavior/Discharge Planning: Goal: Ability to manage health-related needs will improve Outcome: Progressing   Problem: Clinical Measurements: Goal: Will remain free from infection Outcome: Progressing   Problem: Elimination: Goal: Will not experience complications related to bowel motility Outcome: Progressing   

## 2021-06-05 NOTE — Anesthesia Postprocedure Evaluation (Signed)
Anesthesia Post Note ? ?Patient: Matthew [name] ? ?Procedure(s) Performed: MITRAL VALVE REPAIR ?TRANSESOPHAGEAL ECHOCARDIOGRAM (TEE) ? ?  ? ?Patient location during evaluation: SICU ?Anesthesia Type: General ?Level of consciousness: sedated ?Pain management: pain level controlled ?Vital Signs Assessment: post-procedure vital signs reviewed and stable ?Respiratory status: patient re-intubated ?Cardiovascular status: stable ?Postop Assessment: no apparent nausea or vomiting ?Anesthetic complications: no ?Comments: Patient with worsened respiratory effort resulting in hypercapnia and respiratory arrest leading to reintubation in PACU. CO2 > 100 on istat at time of intubation. Dr Ginnie Smart at bedside during intervention.  ? ? ?There were no known notable events for this encounter. ? ?Last Vitals:  ?Vitals:  ? 06/05/21 0852 06/05/21 1143  ?BP: 107/77 107/77  ?Pulse: 68   ?Resp: 18 16  ?Temp: 36.7 ?C   ?SpO2:    ?  ?Last Pain:  ?Vitals:  ? 06/05/21 0852  ?TempSrc: Oral  ?PainSc: 0-No pain  ? ? ?  ?  ?  ?  ?  ?  ? ?[name] ? ? ? ? ?

## 2021-06-05 NOTE — Progress Notes (Signed)
Patient ID: Matthew [name], male   DOB: [birth year], 81 y.o.   MRN: [id number] ?  ? ? Advanced Heart Failure Rounding Note ? ?PCP-Cardiologist: Larae Grooms, MD  ? ?Subjective:   ? ?04/05: RHC Low filling pressures with normal CO.  ?04/06: Seen by structural team for consideration of mitral TEER ?04/12: Started on milrinone 0.125, lactic acid 2.8>>1.7>1.6 ?04/16: Milrinone discontinued  ?04/19: CO-OX low. Milrinone 0.125 mcg restarted.  ?04/20: S/P MTEER ?04/21: Extubated ? ?CO-OX 73% on Milrinone 0.125 mcg/kg/min.  Creatinine 2.63 => 2.31.  CVP 11.   ? ?Very hard of hearing.  Oriented to hospital but asks me if he was in a wreck.  ? ? ?Objective:   ?Weight Range: ?85.1 kg ?Body mass index is 21.14 kg/m?.  ? ?Vital Signs:   ?Temp:  [97 ?F (36.1 ?C)-98 ?F (36.7 ?C)] 98 ?F (36.7 ?C) (04/23 3818) ?Pulse Rate:  [63-69] 68 (04/23 0852) ?Resp:  [15-20] 18 (04/23 2993) ?BP: (82-107)/(68-80) 107/77 (04/23 7169) ?SpO2:  [100 %] 100 % (04/23 0405) ?Weight:  [84.4 kg-85.1 kg] 85.1 kg (04/23 0700) ?Last BM Date : 06/04/21 ? ?Weight change: ?Filed Weights  ? 06/04/21 0600 06/04/21 1554 06/05/21 0700  ?Weight: 84.5 kg 84.4 kg 85.1 kg  ? ? ?Intake/Output:  ? ?Intake/Output Summary (Last 24 hours) at 06/05/2021 0906 ?Last data filed at 06/05/2021 0700 ?Gross per 24 hour  ?Intake 69.04 ml  ?Output 645 ml  ?Net -575.96 ml  ? ?  ? ? ?Physical Exam  ?CVP 11 ?General: NAD ?Neck: JVP 8-9 cm, no thyromegaly or thyroid nodule.  ?Lungs: Clear to auscultation bilaterally with normal respiratory effort. ?CV: Nondisplaced PMI.  Heart regular S1/S2, no S3/S4, 2/6 HSM apex.  No peripheral edema.    ?Abdomen: Soft, nontender, no hepatosplenomegaly, no distention.  ?Skin: Intact without lesions or rashes.  ?Neurologic: Alert and oriented x 3.  ?Psych: Normal affect. ?Extremities: No clubbing or cyanosis.  ?HEENT: Normal.  ? ? ?Labs  ?  ?CBC ?Recent Labs  ?  06/03/21 ?6789 06/04/21 ?0456  ?WBC 7.0 8.2  ?HGB 11.7* 11.1*  ?HCT 35.5* 35.6*  ?MCV  78.7* 83.0  ?PLT 167 142*  ? ? ?Basic Metabolic Panel ?Recent Labs  ?  06/04/21 ?0456 06/05/21 ?0610  ?NA 133* 135  ?K 4.8 5.0  ?CL 101 102  ?CO2 24 25  ?GLUCOSE 133* 95  ?BUN 57* 59*  ?CREATININE 2.63* 2.31*  ?CALCIUM 8.9 9.3  ?MG 2.3  --   ? ?Liver Function Tests ?No results for input(s): AST, ALT, ALKPHOS, BILITOT, PROT, ALBUMIN in the last 72 hours. ?No results for input(s): LIPASE, AMYLASE in the last 72 hours. ?Cardiac Enzymes ?No results for input(s): CKTOTAL, CKMB, CKMBINDEX, TROPONINI in the last 72 hours. ? ?BNP: ?BNP (last 3 results) ?Recent Labs  ?  05/09/21 ?3810 05/10/21 ?0205 05/13/21 ?2142  ?BNP >4,500.0* >4,500.0* >4,500.0*  ? ? ?ProBNP (last 3 results) ?No results for input(s): PROBNP in the last 8760 hours. ? ? ?D-Dimer ?No results for input(s): DDIMER in the last 72 hours. ?Hemoglobin A1C ?No results for input(s): HGBA1C in the last 72 hours. ?Fasting Lipid Panel ?No results for input(s): CHOL, HDL, LDLCALC, TRIG, CHOLHDL, LDLDIRECT in the last 72 hours. ?Thyroid Function Tests ?No results for input(s): TSH, T4TOTAL, T3FREE, THYROIDAB in the last 72 hours. ? ?Invalid input(s): FREET3 ? ?Other results: ? ? ?Imaging  ? ? ?No results found. ? ? ?Medications:   ? ? ?Scheduled Medications: ? amiodarone  400 mg Oral BID  ?  apixaban  5 mg Oral BID  ? atorvastatin  10 mg Per Tube Daily  ? chlorhexidine gluconate (MEDLINE KIT)  15 mL Mouth Rinse BID  ? Chlorhexidine Gluconate Cloth  6 each Topical Daily  ? docusate sodium  100 mg Oral BID  ? feeding supplement  237 mL Oral TID BM  ? leptospermum manuka honey  1 application. Topical Daily  ? magnesium oxide  400 mg Oral Daily  ? mexiletine  300 mg Oral Q12H  ? midodrine  15 mg Oral TID WC  ? mupirocin ointment  1 application. Nasal BID  ? pantoprazole  40 mg Oral Daily  ? polyethylene glycol  17 g Oral Daily  ? senna  2 tablet Oral Daily  ? sodium chloride flush  10-40 mL Intracatheter Q12H  ? sodium chloride flush  3 mL Intravenous Q12H  ? torsemide  40  mg Oral Daily  ? ? ?Infusions: ? sodium chloride Stopped (06/04/21 0847)  ? milrinone 0.125 mcg/kg/min (06/05/21 0436)  ? ? ?PRN Medications: ?sodium chloride, acetaminophen, fentaNYL, hydrALAZINE, labetalol, ondansetron (ZOFRAN) IV, sodium chloride flush, sodium chloride flush, sodium chloride flush, traMADol ? ? ? ?Patient Profile  ? ?81 y.o. male with history of chronic biventricular HF, NICM, CAD, severe MR/TR, HTN, obesity and multiple recent admissions, now admitted w/ acute PE and a/c CHF.  ? ?Echo this admission, EF 25%, mildly dilated RV with moderately decreased systolic function, severe MR and TR. IVC dilated.  ? ?Assessment/Plan  ? ?1. Acute on chronic Biventricular HF   ?- ECHO 04/2020 EF 25-30% with mild MR.   ?- Echo 04/04/21 EF LVEF 20-25%, RV moderately reduced, LA/RA severely dilated, and severe MR.  ?- Echo per Dr. 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 ?- cMRI c/w severe NICM w/ basal septal midwall LGE  ?- Elevated lactic acid 2.8 on 04/12. Started on milrinone 0.125 with improvement in lactic acidosis. Milrinone discontinued 4/16. Milrinone restarted 4/19. Improved CO-OX  ?- Co-ox 73% with creatinine trending down 2.3. Continue milrinone 0.125 mcg/kg/min today, may be able to stop tomorrow if creatinine still trending down and volume status ok on torsemide (see below).   ?- CVP higher at 11, will start torsemide 40 mg daily and follow response.  ?- GDMT limited d/t hypotension and AKI. Continue midodrine 15 mg TID with soft BP to help w/ renal perfusion  ?- Stopped Toprol XL with soft BP.   ?- Off Spiro w/ elevated K and SCr  ?- Still with frequent PVCs. Have discussed  with EP. He is not a candidate for PVC ablation.  ?- S/P MTEER but still with 3+ MR.  ? ?2. Lactic acidosis ?- Suspect d/t low-output HF ?- Resolved with milrinone ?- BCx NGTD procalcitonin < 0.10 X 2 ?- No leukocytosis, AF, CXR 04/12 with no acute process ?- Milrinone stopped 4/16 . CO-OX down so milrinone restarted and continues today.    ? ?3. CAD ?- LHC w/ mild non-obstructive CAD ?- No chest pain.  ?- Continue statin ?  ?4. Mitral Regurgitation ?- Severe on echo this admit and last echo 02/23 ?- TEE showed mild restriction of P2. Severe central/posterior MR due to P2 restriction and annular dilation ?-  functional, severely dilated LA and LV  ?- Seen by structural team.  ?- S/P MTEER => still with 3+ MR.  ?  ?5. Tricuspid Regurgitation  ?- moderate on TEE  ?- functional, severely dilated RA and RV   ?- HF optimization per above  ?  ?6. Acute PE ?- On Eliquis.  ?- No evidence of DVT on [place] ?- ? 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  ?7. Hyperlipidemia ?- Continue statin ?  ?8. AKI on CKD IIIa ?- Scr  baseline 1.4-1.7 last few months, this admit 1.4>>1.76>2.02>>>2.30>2.17 > 2.35> 2.19>2.11>2.16>2.44 >2.03 ->2.4->2.64 ?Suspect cardiorenal syndrome. ?-Improving with inotrope.  ?  ?9. NSVT/PVCs ?- suspect contributing to CM , need to suppress. ?- Ongoing high PVC burden. ?- continue mexiletine 300 mg BID  ?- seen by EP 04/12, amiodarone increased to 400 mg BID. See above ?- Keep K > 4 and Mag > 2 ?- per EP, he is not an ablation candidate ? ?10. Hyponatremia ?- limit FW ?- Na 133 ? ?11. Hyperkalemia ?- Received Lokelma. Resolved. ?- K 4.8 today.  ? ?12. Debility ?- Planning for CIR when medically ready ?- Mobilize today.  ? ?Loralie Champagne ?06/05/2021 ?9:06 AM ? ? ? ?

## 2021-06-05 NOTE — Progress Notes (Signed)
Speech Language Pathology Treatment: Dysphagia  ?Patient Details ?Name: Matthew [name] ?MRN: [id number] ?DOB: 01-Sep-1940 ?Today's Date: 06/05/2021 ?Time: [id number] ?SLP Time Calculation (min) (ACUTE ONLY): 13 min ? ?Assessment / Plan / Recommendation ?Clinical Impression ? Pt was seen for dysphagia treatment. He was alert and mentation was improved compared to that noted during the initial evaluation on 4/21. Pt's RN, Cala Bradford, reported that he has been tolerating the current diet without overt s/sx of aspiration. Pt tolerated regular texture solids, mixed consistency boluses, and thin liquids via straw without overt s/sx of aspiration. Mastication was [facility] and minimal oral residue with larger boluses was cleared with pt's independent use of secondary swallows. Pt's diet will be advanced to regular texture solids and thin liquids. Pt reported that he prefers his pills to be crushed, but pt is [facility] and repetition was necessary so SLP questions whether he fully comprehended the question; RN reported adequate tolerance of meds whole with thin liquids. SLP will continue to follow pt briefly to ensure tolerance of the advanced diet. ?  ?HPI HPI: Pt is an 81 y.o. male who presented to the ED 3/31 with SOB and chest pain. Found to have acute RLL segmental and subsegmental PE, acute on chronic congestive heart failure/biventricular failure and severe mitral regurgitation. Pt s/p L radial heart cath 4/5. Pt underwent MitraClip placement 4/20; decreased mental status noted postprocedure with periods of desaturation into the 60s.  Pt was reintubated in the holding area. ETT 4/20-4/21 (0902). PMH: CAD, NICM, CHF, HTN, OA, obesity. Recent admission 3/25-3/28 for UTI, dehydration and encephalopathy. ?  ?   ?SLP Plan ? Continue with current plan of care ? ?  ?  ?Recommendations for follow up therapy are one component of a multi-disciplinary discharge planning process, led by the attending physician.  Recommendations may be updated based  on patient status, additional functional criteria and insurance authorization. ?  ? ?Recommendations  ?Diet recommendations: Regular;Thin liquid ?Liquids provided via: Cup;Straw ?Medication Administration: Whole meds with puree/thin liquids (or crushed; as tolerated) ?Compensations: Slow rate;Small sips/bites  ?   ?    ?   ? ? ? ? Oral Care Recommendations: Oral care BID ?Follow Up Recommendations: Acute inpatient rehab (3hours/day) ?SLP Visit Diagnosis: Dysphagia, unspecified (R13.10) ?Plan: Continue with current plan of care ? ? ? ? ?  ?  ? ?Matthew [name], [place], CCC-SLP ?Acute Rehabilitation Services ?Office number [phone] ?Pager [phone] ? ?Matthew [name] ? ?06/05/2021, 3:10 PM ? ? ?   ?

## 2021-06-05 NOTE — Progress Notes (Signed)
Unable to get standing weight because patient could not hold on to the scale without support. ?

## 2021-06-05 NOTE — Progress Notes (Signed)
OT Cancellation Note ? ?Patient Details ?Name: Matthew [name] ?MRN: [id number] ?DOB: 1940-09-06 ? ? ?Cancelled Treatment:    Reason Eval/Treat Not Completed: Other (comment). Patient is already on OT caseload, will continue to treat on current plan of care.  ? ?Evern Bio [place] Souter ?06/05/2021, 7:51 AM ? ?Nyoka Cowden OTR/L ?Acute Rehabilitation Services ?Pager: [phone] ?Office: [phone] ? ?

## 2021-06-06 DIAGNOSIS — I5023 Acute on chronic systolic (congestive) heart failure: Secondary | ICD-10-CM | POA: Diagnosis not present

## 2021-06-06 DIAGNOSIS — L8990 Pressure ulcer of unspecified site, unspecified stage: Secondary | ICD-10-CM | POA: Insufficient documentation

## 2021-06-06 LAB — CBC
HCT: 36 % — ABNORMAL LOW (ref 39.0–52.0)
Hemoglobin: 11.6 g/dL — ABNORMAL LOW (ref 13.0–17.0)
MCH: 26 pg (ref 26.0–34.0)
MCHC: 32.2 g/dL (ref 30.0–36.0)
MCV: 80.5 fL (ref 80.0–100.0)
Platelets: 141 10*3/uL — ABNORMAL LOW (ref 150–400)
RBC: 4.47 MIL/uL (ref 4.22–5.81)
RDW: 20.5 % — ABNORMAL HIGH (ref 11.5–15.5)
WBC: 7 10*3/uL (ref 4.0–10.5)
nRBC: 0 % (ref 0.0–0.2)

## 2021-06-06 LAB — ECHOCARDIOGRAM COMPLETE
AR max vel: 3.35 cm2
AV Area VTI: 3.14 cm2
AV Area mean vel: 3.39 cm2
AV Mean grad: 2 mmHg
AV Peak grad: 4.9 mmHg
Ao pk vel: 1.11 m/s
Calc EF: 46.5 %
Height: 79 in
MV M vel: 4.76 m/s
MV Peak grad: 90.6 mmHg
S' Lateral: 6.4 cm
Single Plane A2C EF: 53.6 %
Single Plane A4C EF: 39.1 %
Weight: 3001.78 oz

## 2021-06-06 LAB — MAGNESIUM: Magnesium: 2.2 mg/dL (ref 1.7–2.4)

## 2021-06-06 LAB — COOXEMETRY PANEL
Carboxyhemoglobin: 2.8 % — ABNORMAL HIGH (ref 0.5–1.5)
Methemoglobin: <0.7 % (ref 0.0–1.5)
O2 Saturation: 80.2 %
Total hemoglobin: 11.8 g/dL — ABNORMAL LOW (ref 12.0–16.0)

## 2021-06-06 LAB — BASIC METABOLIC PANEL
Anion gap: 7 (ref 5–15)
BUN: 51 mg/dL — ABNORMAL HIGH (ref 8–23)
CO2: 27 mmol/L (ref 22–32)
Calcium: 8.8 mg/dL — ABNORMAL LOW (ref 8.9–10.3)
Chloride: 102 mmol/L (ref 98–111)
Creatinine, Ser: 2.13 mg/dL — ABNORMAL HIGH (ref 0.61–1.24)
GFR, Estimated: 31 mL/min — ABNORMAL LOW (ref >60)
Glucose, Bld: 104 mg/dL — ABNORMAL HIGH (ref 70–99)
Potassium: 4.8 mmol/L (ref 3.5–5.1)
Sodium: 136 mmol/L (ref 135–145)

## 2021-06-06 NOTE — Progress Notes (Signed)
Occupational Therapy Treatment ?Patient Details ?Name: Matthew [name] ?MRN: [id number] ?DOB: 1940-03-29 ?Today's Date: 06/06/2021 ? ? ?History of present illness 81 y.o. male adm 3/31 with SOB with RLL PE treated with Heparin> Eliquis. Admission 3/25-28/2023 with AMS and UTI. L radial heart cath completed on 4/5. S/p transcatheter edge to edge mitral valve repair 4/20, postprocedure developed AMS and hypoxia into the 60s and was intubated. Extubated 4/21. PMHx: CAD, NICM, CHF, HTN, OA, obesity ?  ?OT comments ? Pt has made significant progress and is able to complete sit - stand with mod A and take several steps with min A @ RW level. Able to donn socks/pants over feet and requires assist to stabilize when pulling garment over hips. Completed incentive spirometer x 10 pulling @ 1000 ml. Feel pt would greatly benefit from intensive rehab at [facility] to facilitate safe DC home with assistance of family. Acute OT to continue to follow.  ?VSS  ? ?Recommendations for follow up therapy are one component of a multi-disciplinary discharge planning process, led by the attending physician.  Recommendations may be updated based on patient status, additional functional criteria and insurance authorization. ?   ?Follow Up Recommendations ? Acute inpatient rehab (3hours/day)  ?  ?Assistance Recommended at Discharge Frequent or constant Supervision/Assistance  ?Patient can return home with the following ? A little help with bathing/dressing/bathroom;Assistance with cooking/housework;Direct supervision/assist for financial management;Direct supervision/assist for medications management;Assist for transportation;A lot of help with walking and/or transfers ?  ?Equipment Recommendations ? None recommended by OT  ?  ?Recommendations for Other Services Rehab consult ? ?  ?Precautions / Restrictions Precautions ?Precautions: Fall ?Precaution Comments: L radial heart cath on 4/5 ?Restrictions ?Weight Bearing Restrictions: No  ? ? ?  ? ?Mobility  Bed Mobility ?  ?  ?  ?  ?  ?  ?  ?General bed mobility comments: OOB in recliner ?  ? ?Transfers ?Overall transfer level: Needs assistance ?Equipment used: Rolling Kneip (2 wheels) ?  ?Sit to Stand: Mod assist ?  ?  ?  ?  ?  ?  ?  ?  ?Balance   ?  ?Sitting balance-[name] Scale: Fair ?  ?  ?  ?Standing balance-[name] Scale: Poor ?  ?  ?  ?  ?  ?  ?  ?  ?  ?  ?  ?  ?   ? ?ADL either performed or assessed with clinical judgement  ? ?ADL Overall ADL's : Needs assistance/impaired ?Eating/Feeding: Set up ?  ?Grooming: Set up;Sitting ?  ?Upper Body Bathing: Set up;Sitting ?  ?Lower Body Bathing: Moderate assistance;Sit to/from stand ?  ?Upper Body Dressing : Minimal assistance ?  ?Lower Body Dressing: Moderate assistance ?Lower Body Dressing Details (indicate cue type and reason): able to donn B socks adn pull pants over feet; assitance to pull up over hips and fasten ?Toilet Transfer: Moderate assistance ?  ?Toileting- Clothing Manipulation and Hygiene: Moderate assistance ?  ?  ?  ?Functional mobility during ADLs: Moderate assistance;Rolling Budde (2 wheels) ?  ?  ? ?Extremity/Trunk Assessment Upper Extremity Assessment ?Upper Extremity Assessment: Generalized weakness (limited B shoulder Flexion at baseline) ?  ?Lower Extremity Assessment ?Lower Extremity Assessment: Defer to PT evaluation ?  ?  ?  ? ?Vision   ?Additional Comments: wears glasses ?  ?Perception   ?  ?Praxis   ?  ? ?Cognition Arousal/Alertness: Awake/alert ?Behavior During Therapy: Flat affect ?Overall Cognitive Status: Difficult to assess ?  ?  ?  ?  ?  ?  ?  ?  ?  ?  ?  ?  ?  ?  ?  ?  ?  General Comments: most likely not at baseline however very HOH; slow processing ?  ?  ?   ?Exercises General Exercises - Upper Extremity ?Shoulder Flexion: Strengthening, AROM, 10 reps ?Elbow Flexion: AROM, Both, 10 reps ?Elbow Extension: AROM, Both, 10 reps ?Chair Push Up: 5 reps, Strengthening ? ?  ?Shoulder Instructions   ? ? ?  ?General Comments VSS, pt reports  fatigue in BLE  ? ? ?Pertinent Vitals/ Pain       Pain Assessment ?Pain Assessment: Faces ?Faces Pain Scale: Hurts a little bit ?Pain Location: back ?Pain Descriptors / Indicators: Discomfort, Grimacing ?Pain Intervention(s): Limited activity within patient's tolerance ? ?Home Living   ?  ?  ?  ?  ?  ?  ?  ?  ?  ?  ?  ?  ?  ?  ?  ?  ?  ?  ? ?  ?Prior Functioning/Environment    ?  ?  ?  ?   ? ?Frequency ? Min 2X/week  ? ? ? ? ?  ?Progress Toward Goals ? ?OT Goals(current goals can now be found in the care plan section) ? Progress towards OT goals: Goals met and updated - see care plan ? ?Acute Rehab OT Goals ?Patient Stated Goal: to get stronger ?OT Goal Formulation: With patient ?Time For Goal Achievement: 06/20/21 ?Potential to Achieve Goals: Good ?ADL Goals ?Pt Will Perform Lower Body Bathing: with supervision;sit to/from stand ?Pt Will Perform Lower Body Dressing: with supervision;sit to/from stand ?Pt Will Transfer to Toilet: with supervision;bedside commode;ambulating ?Pt Will Perform Toileting - Clothing Manipulation and hygiene: with supervision;sitting/lateral leans;sit to/from stand  ?Plan Discharge plan remains appropriate   ? ?Co-evaluation ? ? ?   ?  ?  ?  ?  ? ?  ?AM-PAC OT "6 Clicks" Daily Activity     ?Outcome Measure ? ? Help from another person eating meals?: A Little ?Help from another person taking care of personal grooming?: A Little ?Help from another person toileting, which includes using toliet, bedpan, or urinal?: A Lot ?Help from another person bathing (including washing, rinsing, drying)?: A Lot ?Help from another person to put on and taking off regular upper body clothing?: A Little ?Help from another person to put on and taking off regular lower body clothing?: A Lot ?6 Click Score: 15 ? ?  ?End of Session Equipment Utilized During Treatment: Gait belt;Rolling Kilmer (2 wheels) ? ?OT Visit Diagnosis: Unsteadiness on feet (R26.81);Muscle weakness (generalized) (M62.81);Other symptoms and  signs involving cognitive function ?  ?Activity Tolerance Patient tolerated treatment well ?  ?Patient Left in chair;with call bell/phone within reach;with nursing/sitter in room ?  ?Nurse Communication Mobility status ?  ? ?   ? ?Time: 7341-9379 ?OT Time Calculation (min): 22 min ? ?Charges: OT General Charges ?$OT Visit: 1 Visit ?OT Treatments ?$Self Care/Home Management : 8-22 mins ? ?[facility], OT/L  ? ?Acute OT Clinical Specialist ?Acute Rehabilitation Services ?Pager [phone] ?Office [phone]  ? ?Afomia Blackley,[place] ?06/06/2021, 1:24 PM ?

## 2021-06-06 NOTE — Progress Notes (Incomplete)
? ?ADVANCED HF CLINIC CONSULT NOTE ? ?Referring Physician: ?Primary Care: ?HF Cardiologist: Dr. Haroldine Laws ? ?HPI: ?Referred to clinic by Dr Candiss Norse for heart failure consultation.  ?  ?Matthew [name] is an 81 year old with h/o NICM/chronic systolic CHF, CAD, HTN, obesity. Has longstanding history of cardiomyopathy. EF previously improved to 50-55% in 2014. Had cath 2012 severe stenosis of the diagonal, which is not severe enough to cause his EF reduction. ?  ?Last saw [name] in 03/22. Echo repeat, EF down to 25-30%, RV mildly reduced, mild Matthew. He was referred to PharmD for titration of GDMT, last visit July 2022. ?  ?Admitted 03/2021 with a/c CHF. Had not been taking his CHF medications or diuretics several days prior to admission.BNP on admission was >4,500 and CXR showed cardiomegaly with mild pulmonary edema. 2D Echo showed severe dilated biventricular heart failure, LVEF 20 to 25%, RV severely enlarged and severely reduced severe mitral and tricuspid regurgitation and severe BAE. Severely elevated RVSP, 50 mmHg. Cardiology consulted. Diuresed with lasix drip+ metolazone.  Cardiology adjusted his HF meds and he was discharged on Lasix 6m BID, Coreg 246mBID, Entresto 24/2623mID, Spironolactone 29m48mily, and farxiga 10mg76mly. ?  ?Admitted 05/08/21 with AKI and metabolic encephalopathy. Creatinine 1.65. This was felt to be in the setting of recent UTI and spironolactone. Given IV fluids. HF meds restarted. Discharged 05/10/21.  ?  ?He presents today with his son. Feels ok. Limited around the house. He gets SOB with exertion. Has good days and bad days. Denies PND/Orthopnea. No chest pain. Having leg swelling. Appetite ok. No fever or chills. Weight at home has been stable. His daughter helps him with medications. Home health following a few days a week. He no longer drives. Taking all medications. ?  ?Cardiac Testing  ?Echo 03/2021 LVEF 20-25%. RV severely reduced. LA/RA severely reduced. Severe Matthew/Moderate  TR ?Echo 2022  LVEF 25-30% Mild Matthew  ?Echo 2014 LVEF 50-55% ? ? ? ? ? ?Review of Systems: [y] = yes, [ ] = no  ? ?General: Weight gain [ ]; Weight loss [ ]; Anorexia [ ]; Fatigue [ ]; Fever [ ]; Chills [ ]; Weakness [ ]  ?Cardiac: Chest pain/pressure [ ]; Resting SOB [ ]; Exertional SOB [ ]; Orthopnea [ ]; Pedal Edema [ ]; Palpitations [ ]; Syncope [ ]; Presyncope [ ]; Paroxysmal nocturnal dyspnea[ ]  ?Pulmonary: Cough [ ]; Wheezing[ ]; Hemoptysis[ ]; Sputum [ ]; Snoring [ ]  ?GI: Vomiting[ ]; Dysphagia[ ]; Melena[ ]; Hematochezia [ ]; Heartburn[ ]; Abdominal pain [ ]; Constipation [ ]; Diarrhea [ ]; BRBPR [ ]  ?GU: Hematuria[ ]; Dysuria [ ]; Nocturia[ ]  ?Vascular: Pain in legs with walking [ ]; Pain in feet with lying flat [ ]; Non-healing sores [ ]; Stroke [ ]; TIA [ ]; Slurred speech [ ];  ?Neuro: Headaches[ ]; Vertigo[ ]; Seizures[ ]; Paresthesias[ ];Blurred vision [ ]; Diplopia [ ]; Vision changes [ ]  ?Ortho/Skin: Arthritis [ ]; Joint pain [ ]; Muscle pain [ ]; Joint swelling [ ]; Back Pain [ ]; Rash [ ]  ?Psych: Depression[ ]; Anxiety[ ]  ?Heme: Bleeding problems [ ]; Clotting disorders [ ]; Anemia [ ]  ?Endocrine: Diabetes [ ]; Thyroid dysfunction[ ] ? ? ?Past Medical History:  ?Diagnosis Date  ? CAD (coronary artery disease)   ? Chronic HFrEF (heart failure with reduced ejection fraction) (HCC) [place] Chronic kidney disease, stage 3b (HCC) [place]  Encephalopathy 04/2021  ? admitted for metabolic encephalopathy  ? HTN (hypertension)   ? Hyperlipidemia   ? NICM (nonischemic cardiomyopathy) ([place])   ? Obesity   ? Osteoarthritis   ? S/P mitral valve clip implantation 06/02/2021  ? MitraClip XTW x2 with Dr. Ali Lowe and Dr. Burt Knack  ? Severe mitral regurgitation   ? Thoracic aortic aneurysm (TAA) ([place])   ? Thrombocytopenia ([place])   ? noted in labs in 2023  ? Tricuspid regurgitation   ? ? ?No current facility-administered medications for this visit.  ? ?No current outpatient medications on file.  ? ?Facility-Administered  Medications Ordered in Other Visits  ?Medication Dose Route Frequency Provider Last Rate Last Admin  ? 0.9 %  sodium chloride infusion  250 mL Intravenous PRN Tommie Raymond, NP   Stopped at 06/04/21 [phone]  ? acetaminophen (TYLENOL) tablet 650 mg  650 mg Oral Q4H PRN Icard, Bradley L, DO      ? amiodarone (PACERONE) tablet 400 mg  400 mg Oral BID Icard, Bradley L, DO   400 mg at 06/06/21 9163  ? apixaban (ELIQUIS) tablet 5 mg  5 mg Oral BID Icard, Bradley L, DO   5 mg at 06/06/21 8466  ? atorvastatin (LIPITOR) tablet 10 mg  10 mg Per Tube Daily Icard, Bradley L, DO   10 mg at 06/06/21 0837  ? chlorhexidine gluconate (MEDLINE KIT) (PERIDEX) 0.12 % solution 15 mL  15 mL Mouth Rinse BID Ollis, Brandi L, NP   15 mL at 06/06/21 0842  ? Chlorhexidine Gluconate Cloth 2 % PADS 6 each  6 each Topical Daily Tommie Raymond, NP   6 each at 06/06/21 [phone]  ? docusate sodium (COLACE) capsule 100 mg  100 mg Oral BID Icard, Bradley L, DO   100 mg at 06/06/21 5701  ? feeding supplement (ENSURE ENLIVE / ENSURE PLUS) liquid 237 mL  237 mL Oral TID BM Kathyrn Drown D, NP   237 mL at 06/06/21 1351  ? fentaNYL (SUBLIMAZE) bolus via infusion 25-100 mcg  25-100 mcg Intravenous Q15 min PRN Ollis, Brandi L, NP      ? hydrALAZINE (APRESOLINE) injection 5 mg  5 mg Intravenous Q20 Min PRN Kathyrn Drown D, NP      ? labetalol (NORMODYNE) injection 10 mg  10 mg Intravenous Q10 min PRN Tommie Raymond, NP      ? leptospermum manuka honey (MEDIHONEY) paste 1 application.  1 application. Topical Daily Tommie Raymond, NP   1 application. at 06/05/21 2245  ? magnesium oxide (MAG-OX) tablet 400 mg  400 mg Oral Daily Icard, Bradley L, DO   400 mg at 06/06/21 7793  ? mexiletine (MEXITIL) capsule 300 mg  300 mg Oral Q12H Icard, Bradley L, DO   300 mg at 06/06/21 0836  ? midodrine (PROAMATINE) tablet 15 mg  15 mg Oral TID WC Larey Dresser, MD   15 mg at 06/06/21 1247  ? mupirocin ointment (BACTROBAN) 2 % 1 application.  1 application. Nasal BID  Icard, Bradley L, DO   1 application. at 06/06/21 0841  ? ondansetron (ZOFRAN) injection 4 mg  4 mg Intravenous Q6H PRN Kathyrn Drown D, NP      ? pantoprazole (PROTONIX) EC tablet 40 mg  40 mg Oral Daily Icard, Bradley L, DO   40 mg at 06/06/21 0837  ? polyethylene glycol (MIRALAX / GLYCOLAX) packet 17 g  17 g Oral Daily Icard, Bradley L, DO   17 g at  06/06/21 6761  ? senna (SENOKOT) tablet 17.2 mg  2 tablet Oral Daily Icard, Bradley L, DO   17.2 mg at 06/06/21 0836  ? sodium chloride flush (NS) 0.9 % injection 10-40 mL  10-40 mL Intracatheter PRN Kathyrn Drown D, NP   10 mL at 05/25/21 1101  ? sodium chloride flush (NS) 0.9 % injection 10-40 mL  10-40 mL Intracatheter Q12H Icard, Bradley L, DO   10 mL at 06/06/21 0842  ? sodium chloride flush (NS) 0.9 % injection 10-40 mL  10-40 mL Intracatheter PRN Icard, Bradley L, DO      ? sodium chloride flush (NS) 0.9 % injection 3 mL  3 mL Intravenous Q12H Kathyrn Drown D, NP   3 mL at 06/06/21 0843  ? sodium chloride flush (NS) 0.9 % injection 3 mL  3 mL Intravenous PRN Kathyrn Drown D, NP      ? torsemide [facility]) tablet 40 mg  40 mg Oral Daily Larey Dresser, MD   40 mg at 06/06/21 9509  ? traMADol (ULTRAM) tablet 50 mg  50 mg Oral Q8H PRN Tommie Raymond, NP   50 mg at 06/03/21 2033  ? ? ?No Active Allergies ? ?  ?Social History  ? ?Socioeconomic History  ? Marital status: Unknown  ?  Spouse name: Not on file  ? Number of children: Not on file  ? Years of education: Not on file  ? Highest education level: 11th grade  ?Occupational History  ? Occupation: retired  ?Tobacco Use  ? Smoking status: Never  ? Smokeless tobacco: Never  ?Vaping Use  ? Vaping Use: Never used  ?Substance and Sexual Activity  ? Alcohol use: No  ? Drug use: No  ? Sexual activity: Not on file  ?Other Topics Concern  ? Not on file  ?Social History Narrative  ? Not on file  ? ?Social Determinants of Health  ? ?Financial Resource Strain: Low Risk   ? Difficulty of Paying Living Expenses: Not  very hard  ?Food Insecurity: No Food Insecurity  ? Worried About Charity fundraiser in the Last Year: Never true  ? Ran Out of Food in the Last Year: Never true  ?Transportation Needs: No Transportation Needs

## 2021-06-06 NOTE — Progress Notes (Signed)
Physical Therapy Treatment ?Patient Details ?Name: Matthew [name] ?MRN: [id number] ?DOB: Dec 18, 1940 ?Today's Date: 06/06/2021 ? ? ?History of Present Illness 81 y.o. male adm 3/31 with SOB with RLL PE treated with Heparin> Eliquis. Admission 3/25-28/2023 with AMS and UTI. L radial heart cath completed on 4/5. S/p transcatheter edge to edge mitral valve repair 4/20, postprocedure developed AMS and hypoxia into the 60s and was intubated. Extubated 4/21. PMHx: CAD, NICM, CHF, HTN, OA, obesity ? ?  ?PT Comments  ? ? The pt was agreeable to session with focus on progressing OOB mobility and ambulation endurance. The pt needed modA of 2 to stand from recliner with max cues for hand positioning and to facilitate forwards wt shift. The pt was then able to complete x2 bouts of walking with modA and close chair follow for safety as he sits impulsively when fatigued. Continues to present with significant deficits in problem solving, sequencing, and safety awareness, as well as weakness and lack of power in BLE. Continue to recommend acute inpatient rehab at d/c to maximize functional recovery and independence.  ?  ?Recommendations for follow up therapy are one component of a multi-disciplinary discharge planning process, led by the attending physician.  Recommendations may be updated based on patient status, additional functional criteria and insurance authorization. ? ?Follow Up Recommendations ? Acute inpatient rehab (3hours/day) ?  ?  ?Assistance Recommended at Discharge Frequent or constant Supervision/Assistance  ?Patient can return home with the following Assistance with cooking/housework;Assist for transportation;Help with stairs or ramp for entrance;A lot of help with walking and/or transfers;Two people to help with walking and/or transfers;A lot of help with bathing/dressing/bathroom;Direct supervision/assist for medications management;Direct supervision/assist for financial management ?  ?Equipment Recommendations ?  Rolling [place] (2 wheels)  ?  ?Recommendations for Other Services   ? ? ?  ?Precautions / Restrictions Precautions ?Precautions: Fall ?Precaution Comments: L radial heart cath on 4/5 ?Restrictions ?Weight Bearing Restrictions: No  ?  ? ?Mobility ? Bed Mobility ?Overal bed mobility: Needs Assistance ?  ?  ?  ?  ?  ?  ?General bed mobility comments: pt OOB in recliner at start and end of session ?  ? ?Transfers ?Overall transfer level: Needs assistance ?Equipment used: Rolling [place] (2 wheels) ?Transfers: Sit to/from Stand ?Sit to Stand: Mod assist, +2 safety/equipment, From elevated surface ?  ?  ?  ?  ?  ?General transfer comment: modA of 2 to power up, max cues for hand placement on armrests to stand and then cues to reach for RW, poor forwards wt shift ?  ? ?Ambulation/Gait ?Ambulation/Gait assistance: Mod assist, +2 safety/equipment ?Gait Distance (Feet): 10 Feet (+ 29ft) ?Assistive device: Rolling [place] (2 wheels) ?Gait Pattern/deviations: Trunk flexed, Step-through pattern, Decreased stride length, Knee flexed in stance - right, Knee flexed in stance - left, Knees buckling, Decreased dorsiflexion - left ?Gait velocity: reduced ?Gait velocity interpretation: <1.31 ft/sec, indicative of household ambulator ?  ?General Gait Details: Pt with very slow, shuffling gait and flexed trunk, hips, and knees despite cues to correct. Instability noted in knees (L > R), requiring modA initially for stability. As pt tried to sit prematurely he required maxA. ? ? ? ? ?  ?Balance Overall balance assessment: Needs assistance ?Sitting-balance support: Feet supported, No upper extremity supported ?Sitting balance-[name] Scale: Fair ?  ?Postural control: Posterior lean ?Standing balance support: Bilateral upper extremity supported, During functional activity ?Standing balance-[name] Scale: Poor ?Standing balance comment: BUE support with modA to steady, sitting impulsively and with poor  control of LE hand placement to transfer from  sit-stand ?  ?  ?  ?  ?  ?  ?  ?  ?  ?  ?  ?  ? ?  ?Cognition Arousal/Alertness: Awake/alert ?Behavior During Therapy: Flat affect ?Overall Cognitive Status: Impaired/Different from baseline ?Area of Impairment: Attention, Following commands, Safety/judgement, Problem solving, Memory, Awareness ?  ?  ?  ?  ?  ?  ?  ?  ?  ?Current Attention Level: Focused ?Memory: Decreased recall of precautions, Decreased short-term memory ?Following Commands: Follows one step commands inconsistently, Follows one step commands with increased time ?Safety/Judgement: Decreased awareness of safety, Decreased awareness of deficits ?Awareness: Intellectual ?Problem Solving: Slow processing, Decreased initiation, Difficulty sequencing, Requires verbal cues, Requires tactile cues ?General Comments: pt agreeable to mobility, asking who therapists were multiple times. needs cues for sequencing and at times is unable to process sequencing and movements fast enough to remain physically safe with movements. poor insight to safety and mobility ?  ?  ? ?  ?Exercises   ? ?  ?General Comments General comments (skin integrity, edema, etc.): VSS, pt reports fatigue in BLE ?  ?  ? ?Pertinent Vitals/Pain Pain Assessment ?Pain Assessment: Faces ?Faces Pain Scale: Hurts a little bit ?Pain Location: bottom when sitting upright ?Pain Descriptors / Indicators: Discomfort, Grimacing ?Pain Intervention(s): Limited activity within patient's tolerance, Monitored during session, Repositioned  ? ? ? ?PT Goals (current goals can now be found in the care plan section) Acute Rehab PT Goals ?Patient Stated Goal: agreeable to get to chair ?PT Goal Formulation: With patient ?Time For Goal Achievement: 06/13/21 ?Potential to Achieve Goals: Fair ?Progress towards PT goals: Progressing toward goals ? ?  ?Frequency ? ? ? Min 3X/week ? ? ? ?  ?PT Plan Equipment recommendations need to be updated  ? ? ?   ?AM-PAC PT "6 Clicks" Mobility   ?Outcome Measure ? Help needed  turning from your back to your side while in a flat bed without using bedrails?: A Little ?Help needed moving from lying on your back to sitting on the side of a flat bed without using bedrails?: A Little ?Help needed moving to and from a bed to a chair (including a wheelchair)?: A Lot ?Help needed standing up from a chair using your arms (e.g., wheelchair or bedside chair)?: A Lot ?Help needed to walk in hospital room?: Total ?Help needed climbing 3-5 steps with a railing? : Total ?6 Click Score: 12 ? ?  ?End of Session Equipment Utilized During Treatment: Gait belt ?Activity Tolerance: Patient tolerated treatment well ?Patient left: in chair;with call bell/phone within reach;with chair alarm set;with nursing/sitter in room ?Nurse Communication: Mobility status ?PT Visit Diagnosis: Muscle weakness (generalized) (M62.81);Unsteadiness on feet (R26.81);Other abnormalities of gait and mobility (R26.89);Difficulty in walking, not elsewhere classified (R26.2) ?  ? ? ?Time: 1035-1100 ?PT Time Calculation (min) (ACUTE ONLY): 25 min ? ?Charges:  $Gait Training: 8-22 mins ?$Therapeutic Exercise: 8-22 mins          ?          ? ?Vickki Muff, PT, DPT  ? ?Acute Rehabilitation Department ?Pager #: [phone] ? ? ?Ronnie Derby ?06/06/2021, 12:06 PM ? ?

## 2021-06-06 NOTE — Progress Notes (Signed)
Foley removed per PA. Patient due to void 1930pm.  ?

## 2021-06-06 NOTE — Progress Notes (Signed)
Inpatient Rehabilitation Admissions Coordinator  ? ?Patient progressing well with therapy. I wait medical clearance to admit to CIR. I spoke with his daughter, [name], by phone and she is in agreement. I will follow up Tuesday. ? ?Ottie Glazier, RN, MSN ?Rehab Admissions Coordinator ?[phone] ?06/06/2021 1:30 PM ? ? ?

## 2021-06-06 NOTE — Progress Notes (Addendum)
Patient ID: Matthew [name], male   DOB: [birth year], 81 y.o.   MRN: [id number] ?  ? ? Advanced Heart Failure Rounding Note ? ?PCP-Cardiologist: Larae Grooms, MD  ? ?Subjective:   ? ?04/05: RHC Low filling pressures with normal CO.  ?04/06: Seen by structural team for consideration of mitral TEER ?04/12: Started on milrinone 0.125, lactic acid 2.8>>1.7>1.6 ?04/16: Milrinone discontinued  ?04/19: CO-OX low. Milrinone 0.125 mcg restarted.  ?04/20: S/P MTEER ?04/21: Extubated ? ?CO-OX 80% on 0.125 milrinone. Scr 2.6>2.3>2.13. ? ?? CVP 1-2, waveform flat. 3L UOP charted yesterday. Weight down 6 lb. On Torsemide 40 mg po daily. ? ?Very hard of hearing. ? ?Foley placed post MTEER. ? ? ? ?Objective:   ?Weight Range: ?82.3 kg ?Body mass index is 20.44 kg/m?.  ? ?Vital Signs:   ?Temp:  [97.6 ?F (36.4 ?C)-98 ?F (36.7 ?C)] 97.6 ?F (36.4 ?C) (04/24 0100) ?Pulse Rate:  [65-70] 66 (04/24 0622) ?Resp:  [16-20] 20 (04/24 0031) ?BP: (105-122)/(67-88) 105/68 (04/24 0622) ?SpO2:  [98 %-100 %] 99 % (04/24 0031) ?Weight:  [82.3 kg] 82.3 kg (04/24 0400) ?Last BM Date : 06/06/21 ? ?Weight change: ?Filed Weights  ? 06/04/21 1554 06/05/21 0700 06/06/21 0400  ?Weight: 84.4 kg 85.1 kg 82.3 kg  ? ? ?Intake/Output:  ? ?Intake/Output Summary (Last 24 hours) at 06/06/2021 0809 ?Last data filed at 06/06/2021 0400 ?Gross per 24 hour  ?Intake 179.74 ml  ?Output 3050 ml  ?Net -2870.26 ml  ? ?  ? ? ?Physical Exam  ?General:  Elderly, thin, chronically ill appearing ?HEENT: HOH ?Neck: supple. JVP 8 cm. Carotids 2+ bilat; no bruits.  ?Cor: PMI nondisplaced. Regular rate & rhythm. No rubs, gallops, 2/6 HSM at apex ?Lungs: clear ?Abdomen: soft, nontender, nondistended.  ?Extremities: no cyanosis, clubbing, rash, edema ?Neuro: alert & orientedx3. Affect pleasant ? ? ?TELEMETRY: SR 80s-90s, ~ 5 PVCs/min ? ? ? ?Labs  ?  ?CBC ?Recent Labs  ?  06/04/21 ?0456 06/06/21 ?0421  ?WBC 8.2 7.0  ?HGB 11.1* 11.6*  ?HCT 35.6* 36.0*  ?MCV 83.0 80.5  ?PLT 142* 141*   ? ? ?Basic Metabolic Panel ?Recent Labs  ?  06/04/21 ?0456 06/05/21 ?0610 06/06/21 ?0421  ?NA 133* 135 136  ?K 4.8 5.0 4.8  ?CL 101 102 102  ?CO2 '24 25 27  ' ?GLUCOSE 133* 95 104*  ?BUN 57* 59* 51*  ?CREATININE 2.63* 2.31* 2.13*  ?CALCIUM 8.9 9.3 8.8*  ?MG 2.3  --   --   ? ?Liver Function Tests ?No results for input(s): AST, ALT, ALKPHOS, BILITOT, PROT, ALBUMIN in the last 72 hours. ?No results for input(s): LIPASE, AMYLASE in the last 72 hours. ?Cardiac Enzymes ?No results for input(s): CKTOTAL, CKMB, CKMBINDEX, TROPONINI in the last 72 hours. ? ?BNP: ?BNP (last 3 results) ?Recent Labs  ?  05/09/21 ?7121 05/10/21 ?0205 05/13/21 ?2142  ?BNP >4,500.0* >4,500.0* >4,500.0*  ? ? ?ProBNP (last 3 results) ?No results for input(s): PROBNP in the last 8760 hours. ? ? ?D-Dimer ?No results for input(s): DDIMER in the last 72 hours. ?Hemoglobin A1C ?No results for input(s): HGBA1C in the last 72 hours. ?Fasting Lipid Panel ?No results for input(s): CHOL, HDL, LDLCALC, TRIG, CHOLHDL, LDLDIRECT in the last 72 hours. ?Thyroid Function Tests ?No results for input(s): TSH, T4TOTAL, T3FREE, THYROIDAB in the last 72 hours. ? ?Invalid input(s): FREET3 ? ?Other results: ? ? ?Imaging  ? ? ?No results found. ? ? ?Medications:   ? ? ?Scheduled Medications: ? amiodarone  400 mg  Oral BID  ? apixaban  5 mg Oral BID  ? atorvastatin  10 mg Per Tube Daily  ? chlorhexidine gluconate (MEDLINE KIT)  15 mL Mouth Rinse BID  ? Chlorhexidine Gluconate Cloth  6 each Topical Daily  ? docusate sodium  100 mg Oral BID  ? feeding supplement  237 mL Oral TID BM  ? leptospermum manuka honey  1 application. Topical Daily  ? magnesium oxide  400 mg Oral Daily  ? mexiletine  300 mg Oral Q12H  ? midodrine  15 mg Oral TID WC  ? mupirocin ointment  1 application. Nasal BID  ? pantoprazole  40 mg Oral Daily  ? polyethylene glycol  17 g Oral Daily  ? senna  2 tablet Oral Daily  ? sodium chloride flush  10-40 mL Intracatheter Q12H  ? sodium chloride flush  3 mL  Intravenous Q12H  ? torsemide  40 mg Oral Daily  ? ? ?Infusions: ? sodium chloride Stopped (06/04/21 0847)  ? milrinone 0.125 mcg/kg/min (06/06/21 0200)  ? ? ?PRN Medications: ?sodium chloride, acetaminophen, fentaNYL, hydrALAZINE, labetalol, ondansetron (ZOFRAN) IV, sodium chloride flush, sodium chloride flush, sodium chloride flush, traMADol ? ? ? ?Patient Profile  ? ?81 y.o. male with history of chronic biventricular HF, NICM, CAD, severe MR/TR, HTN, obesity and multiple recent admissions, now admitted w/ acute PE and a/c CHF.  ? ?Echo this admission, EF 25%, mildly dilated RV with moderately decreased systolic function, severe MR and TR. IVC dilated.  ? ?Assessment/Plan  ? ?1. Acute on chronic Biventricular HF   ?- ECHO 04/2020 EF 25-30% with mild MR.   ?- Echo 04/04/21 EF LVEF 20-25%, RV moderately reduced, LA/RA severely dilated, and severe MR.  ?- Echo per Dr. 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 ?- cMRI c/w severe NICM w/ basal septal midwall LGE  ?- Elevated lactic acid 2.8 on 04/12. Started on milrinone 0.125 with improvement in lactic acidosis. Milrinone discontinued 4/16. Milrinone restarted 4/19. Improved CO-OX  ?- Co-ox 80% with creatinine trending down 2.1. Not certain CVP accurate. Weight down 6 lb with po Torsemide. Volume looks good today. Continue po Torsemide 40 daily. ?- Stop milrinone today. ?- GDMT limited d/t hypotension and AKI. Continue midodrine 15 mg TID with soft BP to help w/ renal perfusion  ?- Stopped Toprol XL with soft BP.   ?- Off Spiro w/ elevated K and SCr  ?- Still with frequent PVCs, but there seems to be some improvement post MTEER. Have discussed with  EP. He is not a candidate for PVC ablation.  ?- S/P MTEER but still with 3+ MR.  ? ?2. Lactic acidosis ?- Suspect d/t low-output HF ?- Resolved with milrinone ?- BCx NGTD procalcitonin < 0.10 X 2 ?- No leukocytosis, AF, CXR 04/12 with no acute process ?- Milrinone stopped 4/16 . CO-OX down so milrinone restarted . CO-OX stable today. Attempt to wean off milrinone again today.   ? ?3. CAD ?- LHC w/ mild non-obstructive CAD ?- No chest pain.  ?- Continue statin ?  ?4. Mitral Regurgitation ?- Severe on echo this admit and last echo 02/23 ?- TEE showed mild restriction of P2. Severe central/posterior MR due to P2 restriction  and annular dilation ?- functional, severely dilated LA and LV  ?- Seen by structural team.  ?- S/P MTEER => still with 3+ MR.  ?  ?5. Tricuspid Regurgitation  ?- moderate on TEE  ?- functional, severely dilated RA and RV   ?- HF optimization per above  ?  ?6. Acute PE ?- On Eliquis.  ?- No evidence of DVT on [place] ?- ? 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  ?7. Hyperlipidemia ?- Continue statin ?  ?8. AKI on CKD IIIa ?- Scr  baseline 1.4-1.7 last few months, this admit 1.4>>1.76>2.02>>>2.30>2.17 > 2.35> 2.19>2.11>2.16>2.44 >2.03 ->2.4->2.64->2.3->2.1 ?Suspect cardiorenal syndrome. ?-Improving with inotrope.  ?  ?9. NSVT/PVCs ?- suspect contributing to CM , need to suppress. ?- Ongoing high PVC burden. Slightly improved post MTEER ?- continue mexiletine 300 mg BID  ?- seen by EP 04/12, amiodarone increased to 400 mg BID. See above ?- Keep K > 4 and Mag > 2 ?- per EP, he is not an ablation candidate ? ?10. Hyponatremia ?- limit FW ?- Na 136 ? ?11. Hyperkalemia ?- Received Lokelma. Resolved. ?- K 4.8 today ? ?12. Debility ?- Planning for CIR when medically ready. IP rehab coordinator following ?- continue to mobilize today.  ? ?Discontinue milrinone. Continue po Torsemide. ? ?Remove foley. ? ? ?FINCH, LINDSAY N ?06/06/2021 ?8:09 AM ? ?Patient seen and examined  with the above-signed Advanced Practice Provider and/or Housestaff. I personally reviewed laboratory data, imaging studies and relevant notes. I independently examined the patient and formulated the important as

## 2021-06-06 NOTE — Progress Notes (Signed)
Mobility Specialist Progress Note: ? ? 06/06/21 1515  ?Mobility  ?Activity Transferred from chair to bed  ?Level of Assistance Moderate assist, patient does 50-74%  ?Assistive Device Front wheel [place]  ?Distance Ambulated (ft) 2 ft  ?Activity Response Tolerated well  ?$Mobility charge 1 Mobility  ? ?Pt presenting with decr cognition this afternoon, holding urinal while having BM in chair. Pt transferred to bed with heavy modA with RW. Pericare performed while in bed, pt tolerated well. Pt left in bed with bed alarm on.  ? ?Matthew [name] ?Acute Rehab ?Phone: [phone] ?Office Phone: [phone] ? ?

## 2021-06-06 NOTE — Progress Notes (Signed)
Mobility Specialist Progress Note: ? ? 06/06/21 1030  ?Mobility  ?Activity Transferred from bed to chair  ?Level of Assistance Moderate assist, patient does 50-74%  ?Assistive Device Front wheel Gratz  ?Distance Ambulated (ft) 2 ft  ?Activity Response Tolerated well  ?$Mobility charge 1 Mobility  ? ?Pt eager for mobility session. Required modA + max verbal cues to stand and pivot to chair. Pt tolerated well. Left in chair with all needs met.  ? ?Nelta Numbers ?Acute Rehab ?Phone: [phone] ?Office Phone: [phone] ? ?

## 2021-06-07 ENCOUNTER — Encounter (HOSPITAL_COMMUNITY): Payer: Medicare Other

## 2021-06-07 DIAGNOSIS — E785 Hyperlipidemia, unspecified: Secondary | ICD-10-CM | POA: Diagnosis not present

## 2021-06-07 DIAGNOSIS — I5023 Acute on chronic systolic (congestive) heart failure: Secondary | ICD-10-CM | POA: Diagnosis not present

## 2021-06-07 DIAGNOSIS — N179 Acute kidney failure, unspecified: Secondary | ICD-10-CM | POA: Diagnosis not present

## 2021-06-07 DIAGNOSIS — I2699 Other pulmonary embolism without acute cor pulmonale: Secondary | ICD-10-CM | POA: Diagnosis not present

## 2021-06-07 LAB — BASIC METABOLIC PANEL
Anion gap: 7 (ref 5–15)
BUN: 55 mg/dL — ABNORMAL HIGH (ref 8–23)
CO2: 28 mmol/L (ref 22–32)
Calcium: 9 mg/dL (ref 8.9–10.3)
Chloride: 100 mmol/L (ref 98–111)
Creatinine, Ser: 2.19 mg/dL — ABNORMAL HIGH (ref 0.61–1.24)
GFR, Estimated: 30 mL/min — ABNORMAL LOW (ref >60)
Glucose, Bld: 96 mg/dL (ref 70–99)
Potassium: 5.3 mmol/L — ABNORMAL HIGH (ref 3.5–5.1)
Sodium: 135 mmol/L (ref 135–145)

## 2021-06-07 LAB — CBC
HCT: 37.6 % — ABNORMAL LOW (ref 39.0–52.0)
Hemoglobin: 12.1 g/dL — ABNORMAL LOW (ref 13.0–17.0)
MCH: 26.1 pg (ref 26.0–34.0)
MCHC: 32.2 g/dL (ref 30.0–36.0)
MCV: 81.2 fL (ref 80.0–100.0)
Platelets: 152 10*3/uL (ref 150–400)
RBC: 4.63 MIL/uL (ref 4.22–5.81)
RDW: 20.6 % — ABNORMAL HIGH (ref 11.5–15.5)
WBC: 7 10*3/uL (ref 4.0–10.5)
nRBC: 0 % (ref 0.0–0.2)

## 2021-06-07 LAB — COOXEMETRY PANEL
Carboxyhemoglobin: 2.1 % — ABNORMAL HIGH (ref 0.5–1.5)
Methemoglobin: <0.7 % (ref 0.0–1.5)
O2 Saturation: 71.3 %
Total hemoglobin: 12.4 g/dL (ref 12.0–16.0)

## 2021-06-07 LAB — POTASSIUM: Potassium: 5.2 mmol/L — ABNORMAL HIGH (ref 3.5–5.1)

## 2021-06-07 MED ORDER — BOOST / RESOURCE BREEZE PO LIQD CUSTOM
1.0000 | Freq: Three times a day (TID) | ORAL | Status: DC
  Administered 2021-06-07 – 2021-06-08 (×2): 1 via ORAL

## 2021-06-07 NOTE — Progress Notes (Addendum)
Nutrition Follow-up ? ?DOCUMENTATION CODES:  ? ?Severe malnutrition in context of chronic illness ? ?INTERVENTION:  ?Provide Boost Breeze po TID, each supplement provides 250 kcal and 9 grams of protein ? ?Encourage adequate PO intake.  ? ?NUTRITION DIAGNOSIS:  ? ?Severe Malnutrition related to chronic illness (CHF) as evidenced by severe fat depletion, severe muscle depletion; ongoing ? ?GOAL:  ? ?Patient will meet greater than or equal to 90% of their needs; progressing ? ?MONITOR:  ? ?PO intake, Supplement acceptance, Weight trends, I & O's ? ?REASON FOR ASSESSMENT:  ? ?LOS ?  ? ?ASSESSMENT:  ? ?81 y.o. male with history of CAD, CHF, HTN, HLD, CKD3, and lactose intolerance presented to ED with SOB and chest pain. Found to have acute PE. ? ?4/20 Transcatheter edge to edge mitral valve repair  ?4/21 Extubated ? ?Meal completion has been 25-100%. Family at bedside aiding pt feed at lunch at time of visit. Pt previously with Ensure ordered TID and has been consuming them. Potassium elevated today at 5.3. MD concerned Ensure contributing to elevated potassium, this supplement has been switched over to [facility]. Labs and medications reviewed.  ? ?Diet Order:   ?Diet Order   ? ?       ?  Diet regular Room service appropriate? Yes with Assist; Fluid consistency: Thin  Diet effective now       ?  ? ?  ?  ? ?  ? ? ?EDUCATION NEEDS:  ? ?No education needs have been identified at this time ? ?Skin:  Skin Assessment: Skin Integrity Issues: ?Skin Integrity Issues:: Stage II ?Stage II: sacrum ? ?Last BM:  4/23 ? ?Height:  ? ?Ht Readings from Last 1 Encounters:  ?06/04/21 6\' 7"  (2.007 m)  ? ? ?Weight:  ? ?Wt Readings from Last 1 Encounters:  ?06/07/21 82.3 kg  ? ? ?Ideal Body Weight:  100 kg ? ?BMI:  Body mass index is 20.44 kg/m?. ? ?Estimated Nutritional Needs:  ? ?Kcal:  2200-2400 kcal/d ? ?Protein:  110-125 g/d ? ?Fluid:  2.2-2.5 L/d ? ?Corrin Parker, MS, RD, LDN ?RD pager number/after hours weekend pager number on  Amion. ? ?

## 2021-06-07 NOTE — Progress Notes (Signed)
Repeat potassium level resulted a level of 5.2. Paged and notified Tonye Becket, NP. NP orders to continue to watch for any changes. No new orders. Patient is asymptomatic. ?

## 2021-06-07 NOTE — Progress Notes (Signed)
Physical Therapy Treatment ?Patient Details ?Name: Matthew [name] ?MRN: [id number] ?DOB: 06/03/1940 ?Today's Date: 06/07/2021 ? ? ?History of Present Illness 81 y.o. male adm 3/31 with SOB with RLL PE treated with Heparin> Eliquis. Admission 3/25-28/2023 with AMS and UTI. L radial heart cath completed on 4/5. S/p transcatheter edge to edge mitral valve repair 4/20, postprocedure developed AMS and hypoxia into the 60s and was intubated. Extubated 4/21. PMHx: CAD, NICM, CHF, HTN, OA, obesity ? ?  ?PT Comments  ? ? Patient progressing well towards PT goals. Session focused on progressive ambulation and functional transfers/strengthening. Pt continues to demonstrate cognitive deficits relating to safety, awareness, problem solving and judgment. Requires Mod A of 2 for gait training with use of RW and close chair follow as pt with poor ability to pace self/monitor performance and know when to rest despite knees buckling. Pt continues to be a high fall risk but making steady improvements with mobility. Continues to be appropriate for AIR. Will follow. ?  ?Recommendations for follow up therapy are one component of a multi-disciplinary discharge planning process, led by the attending physician.  Recommendations may be updated based on patient status, additional functional criteria and insurance authorization. ? ?Follow Up Recommendations ? Acute inpatient rehab (3hours/day) ?  ?  ?Assistance Recommended at Discharge Frequent or constant Supervision/Assistance  ?Patient can return home with the following Assistance with cooking/housework;Assist for transportation;A lot of help with walking and/or transfers;Two people to help with walking and/or transfers;A lot of help with bathing/dressing/bathroom;Direct supervision/assist for medications management;Direct supervision/assist for financial management ?  ?Equipment Recommendations ? Rolling Borenstein (2 wheels)  ?  ?Recommendations for Other Services   ? ? ?  ?Precautions /  Restrictions Precautions ?Precautions: Fall ?Restrictions ?Weight Bearing Restrictions: No  ?  ? ?Mobility ? Bed Mobility ?Overal bed mobility: Needs Assistance ?Bed Mobility: Supine to Sit ?  ?  ?Supine to sit: Min assist, HOB elevated ?  ?  ?General bed mobility comments: Continuous cues needed to mobilize with steps for sequencing. Min A for trunk. ?  ? ?Transfers ?Overall transfer level: Needs assistance ?Equipment used: Rolling Goh (2 wheels) ?Transfers: Sit to/from Stand ?Sit to Stand: Mod assist, +2 physical assistance ?  ?  ?  ?  ?  ?General transfer comment: Mod A of 2 to power to standing from EOB x3, from chair x5 with cues for hand placement/technique with pt bringing feet forward away from CoM instead of back towards chair resulting in needing more assist. poor forward weight shift. ?  ? ?Ambulation/Gait ?Ambulation/Gait assistance: Mod assist, +2 safety/equipment ?Gait Distance (Feet): 75 Feet ?Assistive device: Rolling Lenk (2 wheels) ?Gait Pattern/deviations: Trunk flexed, Step-through pattern, Decreased stride length, Knee flexed in stance - right, Knee flexed in stance - left, Knees buckling ?Gait velocity: varies ?  ?  ?General Gait Details: At first, slow shuffling like gait but increased speed unsafely at end of gait with knees buckling and flexed trunk/knees at baseline, poor ability to pace self/activity/strength, attempting to keep walking quickly when legs giving out resulting in PT needing to pull pt back into chair quickly. ? ? ?Stairs ?  ?  ?  ?  ?  ? ? ?Wheelchair Mobility ?  ? ?Modified Rankin (Stroke Patients Only) ?  ? ? ?  ?Balance Overall balance assessment: Needs assistance ?Sitting-balance support: Feet supported, No upper extremity supported ?Sitting balance-[name] Scale: Fair ?  ?  ?Standing balance support: During functional activity, Reliant on assistive device for balance ?Standing balance-[name] Scale:  Poor ?Standing balance comment: Requires UE support and external  support for standing for pericare witih right lateral lean. ?  ?  ?  ?  ?  ?  ?  ?  ?  ?  ?  ?  ? ?  ?Cognition Arousal/Alertness: Awake/alert ?Behavior During Therapy: Flat affect, Impulsive ?Overall Cognitive Status: Difficult to assess ?Area of Impairment: Safety/judgement, Awareness, Following commands, Memory, Problem solving ?  ?  ?  ?  ?  ?  ?  ?  ?  ?  ?Memory: Decreased recall of precautions, Decreased short-term memory ?Following Commands: Follows one step commands inconsistently, Follows one step commands with increased time ?Safety/Judgement: Decreased awareness of safety, Decreased awareness of deficits ?Awareness: Intellectual ?Problem Solving: Slow processing, Decreased initiation, Difficulty sequencing, Requires verbal cues, Requires tactile cues ?General Comments: most likely not at baseline however very HOH; slow processing. A&Ox4 requires repetition due to [facility]. Poor awareness of safety, "I want to walk by myself today." ?  ?  ? ?  ?Exercises   ? ?  ?General Comments General comments (skin integrity, edema, etc.): VSS. ?  ?  ? ?Pertinent Vitals/Pain Pain Assessment ?Pain Assessment: Faces ?Faces Pain Scale: Hurts little more ?Pain Location: right lower back/buttock ?Pain Descriptors / Indicators: Discomfort, Grimacing, Sore ?Pain Intervention(s): Monitored during session, Repositioned  ? ? ?Home Living   ?  ?  ?  ?  ?  ?  ?  ?  ?  ?   ?  ?Prior Function    ?  ?  ?   ? ?PT Goals (current goals can now be found in the care plan section) Progress towards PT goals: Progressing toward goals ? ?  ?Frequency ? ? ? Min 3X/week ? ? ? ?  ?PT Plan Current plan remains appropriate  ? ? ?Co-evaluation   ?  ?  ?  ?  ? ?  ?AM-PAC PT "6 Clicks" Mobility   ?Outcome Measure ? Help needed turning from your back to your side while in a flat bed without using bedrails?: A Little ?Help needed moving from lying on your back to sitting on the side of a flat bed without using bedrails?: A Little ?Help needed moving to and  from a bed to a chair (including a wheelchair)?: A Lot ?Help needed standing up from a chair using your arms (e.g., wheelchair or bedside chair)?: A Lot ?Help needed to walk in hospital room?: A Lot ?Help needed climbing 3-5 steps with a railing? : Total ?6 Click Score: 13 ? ?  ?End of Session Equipment Utilized During Treatment: Gait belt ?Activity Tolerance: Patient tolerated treatment well ?Patient left: in chair;with call bell/phone within reach;with chair alarm set ?Nurse Communication: Mobility status ?PT Visit Diagnosis: Muscle weakness (generalized) (M62.81);Unsteadiness on feet (R26.81);Other abnormalities of gait and mobility (R26.89);Difficulty in walking, not elsewhere classified (R26.2) ?  ? ? ?Time: [id number] ?PT Time Calculation (min) (ACUTE ONLY): 30 min ? ?Charges:  $Gait Training: 8-22 mins ?$Therapeutic Activity: 8-22 mins          ?          ? ?Marisa Severin, PT, DPT ?Acute Rehabilitation Services ?Secure chat preferred ?Office [phone] ? ? ? ? ? ?[place] ?06/07/2021, 12:51 PM ? ?

## 2021-06-07 NOTE — Plan of Care (Signed)
  Problem: Education: Goal: Knowledge of General Education information will improve Description: Including pain rating scale, medication(s)/side effects and non-pharmacologic comfort measures Outcome: Progressing   Problem: Health Behavior/Discharge Planning: Goal: Ability to manage health-related needs will improve Outcome: Progressing   Problem: Activity: Goal: Risk for activity intolerance will decrease Outcome: Progressing   

## 2021-06-07 NOTE — Progress Notes (Signed)
Inpatient Rehabilitation Admissions Coordinator  ? ?I will follow up with patient's progress in the am for a potential admit to CIR once cleared by Attending MD. ? ?Ottie Glazier, RN, MSN ?Rehab Admissions Coordinator ?[phone] ?06/07/2021 1:01 PM ? ?

## 2021-06-07 NOTE — Progress Notes (Signed)
Mobility Specialist Progress Note: ? ? 06/07/21 1400  ?Mobility  ?Activity Transferred from chair to bed  ?Level of Assistance Maximum assist, patient does 25-49%  ?Assistive Device Front wheel Commins  ?Distance Ambulated (ft) 2 ft  ?Activity Response Tolerated fair  ?$Mobility charge 1 Mobility  ? ?Pt requesting to get back to bed from chair. Required maxA +1 to stand and pivot to chair. Pt required max verbal cues for sequencing. Left pt in bed with bed alarm on, all needs met.  ? ?Nelta Numbers ?Acute Rehab ?Phone: [phone] ?Office Phone: [phone] ? ?

## 2021-06-07 NOTE — Discharge Summary (Addendum)
Advanced Heart Failure Team ? ?Discharge Summary  ? ?Patient ID: Matthew [name] ?MRN: [id number], DOB/AGE: Sep 24, 1940 81 y.o. Admit date: 05/13/2021 ?D/C date:     06/08/2021  ? ?Primary Discharge Diagnoses:  ?Acute on chronic biventricular heart failure ?Lactic acidosis ?CAD ?Severe mitral regurgitation ?Tricuspid regurgitation ?Acute PE ?Hyperlipidemia ?AKI on CKD IIIa ?NSVT/PVCs ?Hyperkalemia ? ? ?Hospital Course:  ?Matthew [name] is a 81 year old male with h/o NICM/chronic systolic CHF, CAD, HTN, obesity. Has longstanding history of cardiomyopathy. EF previously improved to 50-55% in 2014. Cath 2012 with severe stenosis of the diagonal, which is not severe enough to cause cardiomyopathy ?  ?Admitted 03/2021 with a/c CHF. Had not been taking his CHF medications or diuretics several days prior to admission. BNP on admission was >4,500.  Echo LVEF 20 to 25%, RV severely enlarged and severely reduced, severe mitral and tricuspid regurgitation, severe BAE, severely elevated RVSP, 50 mmHg. Cardiology consulted. Diuresed with lasix drip+ metolazone.   ?  ?Admitted 05/08/21 with AKI and metabolic encephalopathy. Creatinine 1.65. This was felt to be in the setting of recent UTI and spironolactone. Given IV fluids. HF meds restarted. Discharged 05/10/21.  ?  ?Seen in [facility] clinic on 05/13/21. Volume appeared elevated.  PO lasix increased. R/LHC + TEE arranged for additional workup of severely reduced EF and mitral regurgitation.  ?  ?Presented to the ED on 05/13/21 with complaints of dyspnea and CP. CTA chest with evidence of PE. BNP > 4,500. He was admitted for management of PE and acute on chronic biventricular heart failure.  ?  ?Echo 05/14/21: EF 25%, LV severely dilated, heavy trabeculations with no evidence of thrombus, mildly dildated RV with moderately decreased systolic function, severe BAE, severe MR, severe TR, dilated IVC suggesting RAP > 15 mmHg ?  ?Cardiology consulted. Developed AKI with attempts to diurese.  Advanced Heart Failure Consulted. He was started on midodrine. PICC line placed for CVP and CO-OX monitoring. Diuresed with IV lasix. Initially CO-OX okay and CO preserved on RHC 04/05. Later developed lactic acidosis and milrinone added. Attempted to wean off milrinone but developed recurrent low-output.  ? ?He was seen by structural heart team for severe MR and felt to be candidate for mTEER. Underwent mTEER on 06/02/21. He was hypoxic post procedure and was intubated. Extubated on 04/21. Still with 3+ MR post TEER but improved clinically. He was weaned off milrinone on 04/24. CO-OX and renal function remained stable. Hospital course further complicated by AKI on CKD IIIa and very frequent PVCs/NSVT for which he was started on mexiletine and amiodarone. Seen by ED and not felt to be a candidate for ablation. ? ?Evaluated by PT/OT and Acute Inpatient Rehab recommended d/t weakness and functional decline. Approved for CIR. Transferred to CIR. PICC removed prior to transfer.  ? ?Please see below for hospital course by problem. ? ?Hospital Course by Problem: ?1. Acute on chronic Biventricular HF   ?- ECHO 04/2020 EF 25-30% with mild MR.   ?- Echo 04/04/21 EF LVEF 20-25%, RV moderately reduced, LA/RA severely dilated, and severe MR.  ?- Echo per Dr. 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 ?- cMRI c/w severe NICM w/ basal septal midwall LGE  ?-  Elevated lactic acid 2.8 on 04/12. Started on milrinone 0.125 with improvement in lactic acidosis. Milrinone discontinued 4/16. Milrinone restarted 4/19. Improved CO-OX  ?- Milrinone weaned off 04/24. ?- Volume status stable. CO-OX stable.  ?- Continue  current dose of torsemide.  ?- GDMT limited d/t hypotension and AKI. Continue midodrine 15 mg TID with soft BP to help w/ renal perfusion  ?- Stopped Toprol XL with soft BP.   ?- Off Spiro w/ elevated K and SCr  ?- Still with frequent PVCs, but there seems to be some improvement post MTEER. Have discussed with EP. He is not a candidate for PVC ablation.  ?- S/P MTEER but still with 3+ MR.  ?- Remove PICC  ?  ?2. Lactic acidosis ?- Suspect d/t low-output HF ?- Resolved with milrinone ?- BCx NGTD procalcitonin < 0.10 X 2 ?- No leukocytosis, AF, CXR 04/12 with no acute process ?- Milrinone stopped 4/16 . CO-OX down so milrinone restarted . CO-OX stable today.  ?- Milrinone off 04/24. Follow closely.  ?  ?3. CAD ?- LHC w/ mild non-obstructive CAD ?- No chest pain.  ?- Continue statin ?  ?4. Mitral Regurgitation ?- Severe on echo this admit and last echo 02/23 ?- TEE showed mild restriction of P2. Severe central/posterior MR due to P2 restriction and annular dilation ?- functional, severely dilated LA and LV  ?- Seen by structural team.  ?- S/P MTEER => still with 3+ MR.  ?  ?5. Tricuspid Regurgitation  ?- moderate on TEE  ?- functional, severely dilated RA and RV   ?- HF optimization per above  ?  ?6. Acute PE ?- On Eliquis.  ?- No evidence of DVT on [place] ?- ? 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  ?7. Hyperlipidemia ?- Continue statin ?  ?8. AKI on CKD IIIa ?- Scr  baseline 1.4-1.7 last few months, 2.1  ?Suspect cardiorenal syndrome. ?-Improved with inotrope support ?  ?9. NSVT/PVCs ?- suspect contributing to CM , need to suppress. ?- Ongoing high PVC burden. Slightly improved post MTEER ?- continue mexiletine 300 mg BID  ?- seen by EP 04/12, amiodarone increased to 400 mg BID. See above ?- Keep K > 4 and Mag > 2 ?- per EP, he is not an ablation candidate ?  ?10. Hyponatremia ?- limit FW ?- Na 134  ?  ?11. Hyperkalemia ?- Received Lokelma earlier this admit ?- K 4.8 . Not on  any meds that cause hyperkalemia. ?   ?12. Debility ?- D/C to CIR today. ? ?Discharge Vitals: Blood pressure 100/77, pulse (!) 59, temperature 98 ?F (36.7 ?C), temperature source Oral, resp. rate 18, height 6\' 7"  (2.007 m), weight 80.2 kg, SpO2 99 %. ? ?Labs: ?Lab Results  ?Component Value Date  ? WBC 7.6 06/08/2021  ? HGB 12.4 (L) 06/08/2021  ? HCT 39.5 06/08/2021  ? MCV 81.1 06/08/2021  ? PLT 161 06/08/2021  ?  ?Recent Labs  ?Lab 06/08/21 ?0405  ?NA 134*  ?K 4.8  ?CL 96*  ?CO2 30  ?BUN 54*  ?CREATININE 2.10*  ?CALCIUM 9.2  ?GLUCOSE 87  ? ?Lab Results  ?Component Value Date  ? CHOL 97 (L) 05/10/2020  ? HDL 40 05/10/2020  ? LDLCALC 44 05/10/2020  ? TRIG 54 05/10/2020  ? ?BNP (last 3 results) ?Recent Labs  ?  05/09/21 ?[id number] 05/10/21 ?0205 05/13/21 ?2142  ?BNP >4,500.0* >4,500.0* >4,500.0*  ? ? ?ProBNP (last 3 results) ?No results  for input(s): PROBNP in the last 8760 hours. ? ? ?Diagnostic Studies/Procedures  ? ?No results found. ? ?Discharge Medications  ? ?Allergies as of 06/08/2021   ?No Active Allergies ?  ? ?  ?Medication List  ?  ? ?STOP taking these medications   ? ?aspirin EC 81 MG tablet ?  ?dapagliflozin propanediol 10 MG Tabs tablet ?Commonly known as: [place] ?  ?furosemide 20 MG tablet ?Commonly known as: LASIX ?  ?metoprolol succinate 25 MG 24 hr tablet ?Commonly known as: TOPROL-XL ?  ?Prostate Health Caps ?  ?sacubitril-valsartan 24-26 MG ?Commonly known as: ENTRESTO ?  ?spironolactone 25 MG tablet ?Commonly known as: ALDACTONE ?  ? ?  ? ?TAKE these medications   ? ?acetaminophen 500 MG tablet ?Commonly known as: TYLENOL ?Take 500 mg by mouth every 6 (six) hours as needed for moderate pain. ?  ?amiodarone 400 MG tablet ?Commonly known as: PACERONE ?Take 1 tablet (400 mg total) by mouth 2 (two) times daily. ?  ?apixaban 5 MG Tabs tablet ?Commonly known as: ELIQUIS ?Take 1 tablet (5 mg total) by mouth 2 (two) times daily. ?  ?atorvastatin 10 MG tablet ?Commonly known as: LIPITOR ?Take 1 tablet (10 mg  total) by mouth daily. ?What changed: See the new instructions. ?  ?docusate sodium 100 MG capsule ?Commonly known as: COLACE ?Take 1 capsule (100 mg total) by mouth 2 (two) times daily. ?  ?leptospermum manuka ho

## 2021-06-07 NOTE — Progress Notes (Addendum)
Patient ID: Matthew [name], male   DOB: Dec 18, 1940, 81 y.o.   MRN: [id number] ?  ? ? Advanced Heart Failure Rounding Note ? ?PCP-Cardiologist: Larae Grooms, MD  ? ?Subjective:   ? ?04/05: RHC Low filling pressures with normal CO.  ?04/06: Seen by structural team for consideration of mitral TEER ?04/12: Started on milrinone 0.125, lactic acid 2.8>>1.7>1.6 ?04/16: Milrinone discontinued  ?04/19: CO-OX low. Milrinone 0.125 mcg restarted.  ?04/20: S/P MTEER ?04/21: Extubated ?04/24: Milrinone off ? ? ?CO-OX 71% off milrinone. ? ?CVP 4 ? ?Scr 2.13>2.19 ? ?K 5.3. Has been drinking at least 2 ensure a day each containing 560 mg potassium. ? ?Feeling better. Progressing favorably with PT/OT. ? ? ?Objective:   ?Weight Range: ?82.3 kg ?Body mass index is 20.44 kg/m?.  ? ?Vital Signs:   ?Temp:  [98.1 ?F (36.7 ?C)-98.7 ?F (37.1 ?C)] 98.4 ?F (36.9 ?C) (04/25 4235) ?Pulse Rate:  [60-75] 66 (04/25 0900) ?Resp:  [17-19] 17 (04/25 0728) ?BP: (95-128)/(63-80) 107/63 (04/25 0900) ?SpO2:  [98 %-100 %] 100 % (04/25 0900) ?Weight:  [82.3 kg] 82.3 kg (04/25 0314) ?Last BM Date : 06/06/21 ? ?Weight change: ?Filed Weights  ? 06/05/21 0700 06/06/21 0400 06/07/21 0314  ?Weight: 85.1 kg 82.3 kg 82.3 kg  ? ? ?Intake/Output:  ? ?Intake/Output Summary (Last 24 hours) at 06/07/2021 0920 ?Last data filed at 06/07/2021 [phone] ?Gross per 24 hour  ?Intake 709 ml  ?Output 2900 ml  ?Net -2191 ml  ? ?  ? ? ?Physical Exam  ?General:  Thin, elderly male. Sitting up in bed ?HEENT: normal ?Neck: supple. no JVD. Carotids 2+ bilat; no bruits.  ?Cor: PMI nondisplaced. Regular rate & rhythm with ectopy. No rubs, gallops, 2/6 MR murmur ?Lungs: clear ?Abdomen: soft, nontender, nondistended. No hepatosplenomegaly.  ?Extremities: no cyanosis, clubbing, rash, edema, RUE PICC ?Neuro: alert & orientedx3, cranial nerves grossly intact. moves all 4 extremities w/o difficulty. Affect pleasant ? ? ? ?TELEMETRY: SR 60s-70s, ~ 10-15 PVCs/min ? ? ? ?Labs  ?  ?CBC ?Recent  Labs  ?  06/06/21 ?0421 06/07/21 ?0330  ?WBC 7.0 7.0  ?HGB 11.6* 12.1*  ?HCT 36.0* 37.6*  ?MCV 80.5 81.2  ?PLT 141* 152  ? ? ?Basic Metabolic Panel ?Recent Labs  ?  06/06/21 ?0421 06/07/21 ?0330  ?NA 136 135  ?K 4.8 5.3*  ?CL 102 100  ?CO2 27 28  ?GLUCOSE 104* 96  ?BUN 51* 55*  ?CREATININE 2.13* 2.19*  ?CALCIUM 8.8* 9.0  ?MG 2.2  --   ? ?Liver Function Tests ?No results for input(s): AST, ALT, ALKPHOS, BILITOT, PROT, ALBUMIN in the last 72 hours. ?No results for input(s): LIPASE, AMYLASE in the last 72 hours. ?Cardiac Enzymes ?No results for input(s): CKTOTAL, CKMB, CKMBINDEX, TROPONINI in the last 72 hours. ? ?BNP: ?BNP (last 3 results) ?Recent Labs  ?  05/09/21 ?4315 05/10/21 ?0205 05/13/21 ?2142  ?BNP >4,500.0* >4,500.0* >4,500.0*  ? ? ?ProBNP (last 3 results) ?No results for input(s): PROBNP in the last 8760 hours. ? ? ?D-Dimer ?No results for input(s): DDIMER in the last 72 hours. ?Hemoglobin A1C ?No results for input(s): HGBA1C in the last 72 hours. ?Fasting Lipid Panel ?No results for input(s): CHOL, HDL, LDLCALC, TRIG, CHOLHDL, LDLDIRECT in the last 72 hours. ?Thyroid Function Tests ?No results for input(s): TSH, T4TOTAL, T3FREE, THYROIDAB in the last 72 hours. ? ?Invalid input(s): FREET3 ? ?Other results: ? ? ?Imaging  ? ? ?No results found. ? ? ?Medications:   ? ? ?Scheduled Medications: ?  amiodarone  400 mg Oral BID  ? apixaban  5 mg Oral BID  ? atorvastatin  10 mg Per Tube Daily  ? chlorhexidine gluconate (MEDLINE KIT)  15 mL Mouth Rinse BID  ? Chlorhexidine Gluconate Cloth  6 each Topical Daily  ? docusate sodium  100 mg Oral BID  ? feeding supplement  237 mL Oral TID BM  ? leptospermum manuka honey  1 application. Topical Daily  ? magnesium oxide  400 mg Oral Daily  ? mexiletine  300 mg Oral Q12H  ? midodrine  15 mg Oral TID WC  ? pantoprazole  40 mg Oral Daily  ? polyethylene glycol  17 g Oral Daily  ? senna  2 tablet Oral Daily  ? sodium chloride flush  10-40 mL Intracatheter Q12H  ? sodium chloride  flush  3 mL Intravenous Q12H  ? torsemide  40 mg Oral Daily  ? ? ?Infusions: ? sodium chloride Stopped (06/04/21 0847)  ? ? ?PRN Medications: ?sodium chloride, acetaminophen, fentaNYL, hydrALAZINE, labetalol, ondansetron (ZOFRAN) IV, sodium chloride flush, sodium chloride flush, sodium chloride flush, traMADol ? ? ? ?Patient Profile  ? ?81 y.o. male with history of chronic biventricular HF, NICM, CAD, severe MR/TR, HTN, obesity and multiple recent admissions, now admitted w/ acute PE and a/c CHF.  ? ?Echo this admission, EF 25%, mildly dilated RV with moderately decreased systolic function, severe MR and TR. IVC dilated.  ? ?Assessment/Plan  ? ?1. Acute on chronic Biventricular HF   ?- ECHO 04/2020 EF 25-30% with mild MR.   ?- Echo 04/04/21 EF LVEF 20-25%, RV moderately reduced, LA/RA severely dilated, and severe MR.  ?- Echo per Dr. 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 ?- cMRI c/w severe NICM w/ basal septal midwall LGE  ?- Elevated lactic acid 2.8 on 04/12. Started on milrinone 0.125 with improvement in lactic acidosis. Milrinone discontinued 4/16. Milrinone restarted 4/19. Improved CO-OX  ?- Milrinone weaned off 04/24. CO-OX stable, 71% ?- Scr stable at 2.19. CVP 4. Continue po Torsemide 40 mg daily. ?- GDMT limited d/t hypotension and AKI. Continue midodrine 15 mg TID with soft BP to help w/ renal perfusion  ?- Stopped Toprol XL with soft BP.   ?- Off Spiro w/ elevated K and SCr  ?- Still with frequent PVCs, but there seems to be some improvement post MTEER. Have discussed with EP. He is not a candidate for PVC ablation.  ?- S/P MTEER but still with 3+ MR.  ? ?2. Lactic acidosis ?-  Suspect d/t low-output HF ?- Resolved with milrinone ?- BCx NGTD procalcitonin < 0.10 X 2 ?- No leukocytosis, AF, CXR 04/12 with no acute process ?- Milrinone stopped 4/16 . CO-OX down so milrinone restarted . CO-OX stable today.  ?- Milrinone off 04/24. Follow closely.  ? ?3. CAD ?- LHC w/ mild non-obstructive CAD ?- No chest pain.  ?- Continue statin ?  ?4. Mitral Regurgitation ?- Severe on echo this admit and last echo 02/23 ?- TEE showed mild restriction of P2. Severe central/posterior MR due to P2 restriction and annular dilation ?- functional, severely dilated LA and LV  ?- Seen by structural team.  ?- S/P MTEER => still with 3+  MR.  ?  ?5. Tricuspid Regurgitation  ?- moderate on TEE  ?- functional, severely dilated RA and RV   ?- HF optimization per above  ?  ?6. Acute PE ?- On Eliquis.  ?- No evidence of DVT on [place] ?- ? 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  ?7. Hyperlipidemia ?- Continue statin ?  ?8. AKI on CKD IIIa ?- Scr  baseline 1.4-1.7 last few months, this admit 1.4>>1.76>2.02>>>2.30>2.17 > 2.35> 2.19>2.11>2.16>2.44 >2.03 ->2.4->2.64->2.3->2.1->2.19 ?Suspect cardiorenal syndrome. ?-Improved with inotrope support ?  ?9. NSVT/PVCs ?- suspect contributing to CM , need to suppress. ?- Ongoing high PVC burden. Slightly improved post MTEER ?- continue mexiletine 300 mg BID  ?- seen by EP 04/12, amiodarone increased to 400 mg BID. See above ?- Keep K > 4 and Mag > 2 ?- per EP, he is not an ablation candidate ? ?10. Hyponatremia ?- limit FW ?- Na 136 ? ?11. Hyperkalemia ?- Received Lokelma earlier this admit ?- K 5.3. Not on any meds that cause hyperkalemia. ?- Drinking multiple ensure a day, ? If contributing ?- Recheck K ? ?12. Debility ?- Planning for CIR when medically ready. IP rehab coordinator following ?- continue to mobilize today.  ? ?Potentially ready for discharge to CIR tomorrow if continues to progress well off milrinone.  ? ? ?FINCH, LINDSAY  N ?06/07/2021 ?9:20 AM ? ? ?Patient seen and examined with the above-signed Advanced Practice Provider and/or Housestaff. I personally reviewed laboratory data, imaging studies and relevant notes. I independently exami

## 2021-06-07 NOTE — Plan of Care (Signed)
  Problem: Education: Goal: Knowledge of General Education information will improve Description: Including pain rating scale, medication(s)/side effects and non-pharmacologic comfort measures Outcome: Progressing   Problem: Clinical Measurements: Goal: Ability to maintain clinical measurements within normal limits will improve Outcome: Progressing   

## 2021-06-07 NOTE — Progress Notes (Signed)
Called by nursing  ? ?Repeat K 5.2. He is not on potassium or spiro.  ? ?Hold ensure for now. Repeat BMET in am.  ? ?Jasin Brazel NP-C  ? ? ?2:02 PM ? ?

## 2021-06-08 ENCOUNTER — Inpatient Hospital Stay (HOSPITAL_COMMUNITY)
Admission: RE | Admit: 2021-06-08 | Discharge: 2021-06-22 | DRG: 945 | Disposition: A | Discharge status: Home Health | Payer: Medicare Other | Source: Intra-hospital | Attending: Physical Medicine & Rehabilitation | Admitting: Physical Medicine & Rehabilitation

## 2021-06-08 ENCOUNTER — Other Ambulatory Visit: Payer: Self-pay

## 2021-06-08 ENCOUNTER — Encounter (HOSPITAL_COMMUNITY): Payer: Self-pay | Admitting: Physical Medicine and Rehabilitation

## 2021-06-08 DIAGNOSIS — M159 Polyosteoarthritis, unspecified: Secondary | ICD-10-CM | POA: Diagnosis present

## 2021-06-08 DIAGNOSIS — I5022 Chronic systolic (congestive) heart failure: Secondary | ICD-10-CM | POA: Diagnosis present

## 2021-06-08 DIAGNOSIS — N1832 Chronic kidney disease, stage 3b: Secondary | ICD-10-CM | POA: Diagnosis present

## 2021-06-08 DIAGNOSIS — I509 Heart failure, unspecified: Secondary | ICD-10-CM

## 2021-06-08 DIAGNOSIS — L89152 Pressure ulcer of sacral region, stage 2: Secondary | ICD-10-CM | POA: Diagnosis present

## 2021-06-08 DIAGNOSIS — I959 Hypotension, unspecified: Secondary | ICD-10-CM | POA: Diagnosis present

## 2021-06-08 DIAGNOSIS — I428 Other cardiomyopathies: Secondary | ICD-10-CM | POA: Diagnosis present

## 2021-06-08 DIAGNOSIS — B9620 Unspecified Escherichia coli [E. coli] as the cause of diseases classified elsewhere: Secondary | ICD-10-CM | POA: Diagnosis not present

## 2021-06-08 DIAGNOSIS — Z86711 Personal history of pulmonary embolism: Secondary | ICD-10-CM

## 2021-06-08 DIAGNOSIS — H9190 Unspecified hearing loss, unspecified ear: Secondary | ICD-10-CM | POA: Diagnosis present

## 2021-06-08 DIAGNOSIS — K219 Gastro-esophageal reflux disease without esophagitis: Secondary | ICD-10-CM | POA: Diagnosis present

## 2021-06-08 DIAGNOSIS — I081 Rheumatic disorders of both mitral and tricuspid valves: Secondary | ICD-10-CM | POA: Diagnosis present

## 2021-06-08 DIAGNOSIS — E875 Hyperkalemia: Secondary | ICD-10-CM | POA: Diagnosis not present

## 2021-06-08 DIAGNOSIS — I493 Ventricular premature depolarization: Secondary | ICD-10-CM | POA: Diagnosis present

## 2021-06-08 DIAGNOSIS — Z9181 History of falling: Secondary | ICD-10-CM

## 2021-06-08 DIAGNOSIS — E871 Hypo-osmolality and hyponatremia: Secondary | ICD-10-CM | POA: Diagnosis present

## 2021-06-08 DIAGNOSIS — I5082 Biventricular heart failure: Secondary | ICD-10-CM | POA: Diagnosis present

## 2021-06-08 DIAGNOSIS — Z7901 Long term (current) use of anticoagulants: Secondary | ICD-10-CM

## 2021-06-08 DIAGNOSIS — N179 Acute kidney failure, unspecified: Secondary | ICD-10-CM | POA: Diagnosis not present

## 2021-06-08 DIAGNOSIS — E785 Hyperlipidemia, unspecified: Secondary | ICD-10-CM | POA: Diagnosis present

## 2021-06-08 DIAGNOSIS — I5023 Acute on chronic systolic (congestive) heart failure: Secondary | ICD-10-CM | POA: Diagnosis not present

## 2021-06-08 DIAGNOSIS — S81801A Unspecified open wound, right lower leg, initial encounter: Secondary | ICD-10-CM

## 2021-06-08 DIAGNOSIS — I7121 Aneurysm of the ascending aorta, without rupture: Secondary | ICD-10-CM | POA: Diagnosis present

## 2021-06-08 DIAGNOSIS — R5381 Other malaise: Principal | ICD-10-CM | POA: Diagnosis present

## 2021-06-08 DIAGNOSIS — I2510 Atherosclerotic heart disease of native coronary artery without angina pectoris: Secondary | ICD-10-CM | POA: Diagnosis present

## 2021-06-08 DIAGNOSIS — I472 Ventricular tachycardia, unspecified: Secondary | ICD-10-CM | POA: Diagnosis present

## 2021-06-08 DIAGNOSIS — I130 Hypertensive heart and chronic kidney disease with heart failure and stage 1 through stage 4 chronic kidney disease, or unspecified chronic kidney disease: Secondary | ICD-10-CM | POA: Diagnosis present

## 2021-06-08 DIAGNOSIS — L97819 Non-pressure chronic ulcer of other part of right lower leg with unspecified severity: Secondary | ICD-10-CM | POA: Diagnosis present

## 2021-06-08 DIAGNOSIS — I5021 Acute systolic (congestive) heart failure: Secondary | ICD-10-CM

## 2021-06-08 DIAGNOSIS — R4189 Other symptoms and signs involving cognitive functions and awareness: Secondary | ICD-10-CM | POA: Diagnosis present

## 2021-06-08 DIAGNOSIS — E669 Obesity, unspecified: Secondary | ICD-10-CM | POA: Diagnosis present

## 2021-06-08 DIAGNOSIS — N390 Urinary tract infection, site not specified: Secondary | ICD-10-CM | POA: Diagnosis not present

## 2021-06-08 DIAGNOSIS — I5043 Acute on chronic combined systolic (congestive) and diastolic (congestive) heart failure: Secondary | ICD-10-CM | POA: Diagnosis not present

## 2021-06-08 DIAGNOSIS — Z79899 Other long term (current) drug therapy: Secondary | ICD-10-CM

## 2021-06-08 DIAGNOSIS — Z6820 Body mass index (BMI) 20.0-20.9, adult: Secondary | ICD-10-CM

## 2021-06-08 DIAGNOSIS — Z8249 Family history of ischemic heart disease and other diseases of the circulatory system: Secondary | ICD-10-CM

## 2021-06-08 LAB — COOXEMETRY PANEL
Carboxyhemoglobin: 2.5 % — ABNORMAL HIGH (ref 0.5–1.5)
Methemoglobin: <0.7 % (ref 0.0–1.5)
O2 Saturation: 71.7 %
Total hemoglobin: 12.8 g/dL (ref 12.0–16.0)

## 2021-06-08 LAB — BASIC METABOLIC PANEL
Anion gap: 8 (ref 5–15)
BUN: 54 mg/dL — ABNORMAL HIGH (ref 8–23)
CO2: 30 mmol/L (ref 22–32)
Calcium: 9.2 mg/dL (ref 8.9–10.3)
Chloride: 96 mmol/L — ABNORMAL LOW (ref 98–111)
Creatinine, Ser: 2.1 mg/dL — ABNORMAL HIGH (ref 0.61–1.24)
GFR, Estimated: 31 mL/min — ABNORMAL LOW (ref >60)
Glucose, Bld: 87 mg/dL (ref 70–99)
Potassium: 4.8 mmol/L (ref 3.5–5.1)
Sodium: 134 mmol/L — ABNORMAL LOW (ref 135–145)

## 2021-06-08 LAB — CBC
HCT: 39.5 % (ref 39.0–52.0)
Hemoglobin: 12.4 g/dL — ABNORMAL LOW (ref 13.0–17.0)
MCH: 25.5 pg — ABNORMAL LOW (ref 26.0–34.0)
MCHC: 31.4 g/dL (ref 30.0–36.0)
MCV: 81.1 fL (ref 80.0–100.0)
Platelets: 161 10*3/uL (ref 150–400)
RBC: 4.87 MIL/uL (ref 4.22–5.81)
RDW: 20.6 % — ABNORMAL HIGH (ref 11.5–15.5)
WBC: 7.6 10*3/uL (ref 4.0–10.5)
nRBC: 0 % (ref 0.0–0.2)

## 2021-06-08 MED ORDER — MIDODRINE HCL 5 MG PO TABS
15.0000 mg | ORAL_TABLET | Freq: Three times a day (TID) | ORAL | Status: DC
  Administered 2021-06-09 – 2021-06-22 (×41): 15 mg via ORAL
  Filled 2021-06-08 (×43): qty 3

## 2021-06-08 MED ORDER — APIXABAN 5 MG PO TABS: 5.0000 mg | ORAL_TABLET | Freq: Two times a day (BID) | ORAL | Status: DC

## 2021-06-08 MED ORDER — POLYETHYLENE GLYCOL 3350 17 G PO PACK
17.0000 g | PACK | Freq: Every day | ORAL | 0 refills | Status: DC
Start: 2021-06-08 — End: 2021-06-22

## 2021-06-08 MED ORDER — PROCHLORPERAZINE EDISYLATE 10 MG/2ML IJ SOLN: 5.0000 mg | Freq: Four times a day (QID) | INTRAMUSCULAR | Status: DC | PRN

## 2021-06-08 MED ORDER — SENNOSIDES-DOCUSATE SODIUM 8.6-50 MG PO TABS: 1.0000 | ORAL_TABLET | Freq: Every evening | ORAL | Status: DC | PRN

## 2021-06-08 MED ORDER — PROCHLORPERAZINE MALEATE 5 MG PO TABS
5.0000 mg | ORAL_TABLET | Freq: Four times a day (QID) | ORAL | Status: DC | PRN
  Administered 2021-06-09 – 2021-06-13 (×2): 10 mg via ORAL
  Filled 2021-06-08 (×2): qty 2

## 2021-06-08 MED ORDER — ONDANSETRON HCL 4 MG/2ML IJ SOLN
4.0000 mg | Freq: Four times a day (QID) | INTRAMUSCULAR | 0 refills | Status: DC | PRN
Start: 2021-06-08 — End: 2021-06-22

## 2021-06-08 MED ORDER — DIPHENHYDRAMINE HCL 12.5 MG/5ML PO ELIX: 12.5000 mg | ORAL_SOLUTION | Freq: Four times a day (QID) | ORAL | Status: DC | PRN

## 2021-06-08 MED ORDER — AMIODARONE HCL 400 MG PO TABS
400.0000 mg | ORAL_TABLET | Freq: Two times a day (BID) | ORAL | Status: DC
Start: 2021-06-08 — End: 2021-06-22

## 2021-06-08 MED ORDER — MAGNESIUM OXIDE -MG SUPPLEMENT 400 (240 MG) MG PO TABS: 400.0000 mg | ORAL_TABLET | Freq: Every day | ORAL | Status: DC

## 2021-06-08 MED ORDER — DOCUSATE SODIUM 100 MG PO CAPS
100.0000 mg | ORAL_CAPSULE | Freq: Two times a day (BID) | ORAL | 0 refills | Status: DC
Start: 2021-06-08 — End: 2021-06-22

## 2021-06-08 MED ORDER — MEXILETINE HCL 150 MG PO CAPS
300.0000 mg | ORAL_CAPSULE | Freq: Two times a day (BID) | ORAL | Status: DC
Start: 2021-06-08 — End: 2021-06-22

## 2021-06-08 MED ORDER — APIXABAN 5 MG PO TABS
5.0000 mg | ORAL_TABLET | Freq: Two times a day (BID) | ORAL | Status: DC
  Administered 2021-06-08 – 2021-06-22 (×28): 5 mg via ORAL
  Filled 2021-06-08 (×28): qty 1

## 2021-06-08 MED ORDER — PROCHLORPERAZINE 25 MG RE SUPP: 12.5000 mg | Freq: Four times a day (QID) | RECTAL | Status: DC | PRN

## 2021-06-08 MED ORDER — MAGNESIUM OXIDE -MG SUPPLEMENT 400 (240 MG) MG PO TABS
400.0000 mg | ORAL_TABLET | Freq: Every day | ORAL | Status: DC
Start: 2021-06-09 — End: 2021-06-22
  Administered 2021-06-09 – 2021-06-22 (×14): 400 mg via ORAL
  Filled 2021-06-08 (×14): qty 1

## 2021-06-08 MED ORDER — ACETAMINOPHEN 325 MG PO TABS: 325.0000 mg | ORAL_TABLET | ORAL | Status: DC | PRN

## 2021-06-08 MED ORDER — METHOCARBAMOL 500 MG PO TABS
500.0000 mg | ORAL_TABLET | Freq: Four times a day (QID) | ORAL | Status: DC | PRN
  Administered 2021-06-17: 500 mg via ORAL
  Filled 2021-06-08: qty 1

## 2021-06-08 MED ORDER — AMIODARONE HCL 200 MG PO TABS
400.0000 mg | ORAL_TABLET | Freq: Two times a day (BID) | ORAL | Status: DC
  Administered 2021-06-08 – 2021-06-22 (×28): 400 mg via ORAL
  Filled 2021-06-08 (×28): qty 2

## 2021-06-08 MED ORDER — MEDIHONEY WOUND/BURN DRESSING EX PSTE: 1.0000 "application " | PASTE | Freq: Every day | CUTANEOUS | Status: DC

## 2021-06-08 MED ORDER — ATORVASTATIN CALCIUM 10 MG PO TABS
10.0000 mg | ORAL_TABLET | Freq: Every day | ORAL | Status: DC
  Administered 2021-06-09 – 2021-06-22 (×14): 10 mg via ORAL
  Filled 2021-06-08 (×14): qty 1

## 2021-06-08 MED ORDER — MEXILETINE HCL 150 MG PO CAPS
300.0000 mg | ORAL_CAPSULE | Freq: Two times a day (BID) | ORAL | Status: DC
  Administered 2021-06-08 – 2021-06-22 (×28): 300 mg via ORAL
  Filled 2021-06-08 (×31): qty 2

## 2021-06-08 MED ORDER — GUAIFENESIN-DM 100-10 MG/5ML PO SYRP: 5.0000 mL | ORAL_SOLUTION | Freq: Four times a day (QID) | ORAL | Status: DC | PRN

## 2021-06-08 MED ORDER — TORSEMIDE 20 MG PO TABS
40.0000 mg | ORAL_TABLET | Freq: Every day | ORAL | Status: DC
Start: 2021-06-09 — End: 2021-06-15
  Administered 2021-06-09 – 2021-06-15 (×7): 40 mg via ORAL
  Filled 2021-06-08 (×7): qty 2

## 2021-06-08 MED ORDER — PANTOPRAZOLE SODIUM 40 MG PO TBEC: 40.0000 mg | DELAYED_RELEASE_TABLET | Freq: Every day | ORAL | Status: DC

## 2021-06-08 MED ORDER — ATORVASTATIN CALCIUM 10 MG PO TABS: 10.0000 mg | ORAL_TABLET | Freq: Every day | ORAL | Status: DC

## 2021-06-08 MED ORDER — SENNA 8.6 MG PO TABS
2.0000 | ORAL_TABLET | Freq: Every day | ORAL | 0 refills | Status: DC
Start: 2021-06-08 — End: 2021-06-22

## 2021-06-08 MED ORDER — MIDODRINE HCL 5 MG PO TABS
15.0000 mg | ORAL_TABLET | Freq: Three times a day (TID) | ORAL | Status: DC
Start: 2021-06-08 — End: 2021-06-22

## 2021-06-08 MED ORDER — PANTOPRAZOLE SODIUM 40 MG PO TBEC
40.0000 mg | DELAYED_RELEASE_TABLET | Freq: Every day | ORAL | Status: DC
Start: 2021-06-09 — End: 2021-06-22
  Administered 2021-06-09 – 2021-06-22 (×14): 40 mg via ORAL
  Filled 2021-06-08 (×14): qty 1

## 2021-06-08 MED ORDER — SORBITOL 70 % SOLN: 30.0000 mL | Freq: Every day | Status: DC | PRN

## 2021-06-08 MED ORDER — TRAZODONE HCL 50 MG PO TABS: 25.0000 mg | ORAL_TABLET | Freq: Every evening | ORAL | Status: DC | PRN

## 2021-06-08 MED ORDER — TORSEMIDE 40 MG PO TABS: 40.0000 mg | ORAL_TABLET | Freq: Every day | ORAL | Status: DC

## 2021-06-08 MED ORDER — MEDIHONEY WOUND/BURN DRESSING EX PSTE
1.0000 "application " | PASTE | Freq: Every day | CUTANEOUS | Status: DC
  Administered 2021-06-09 – 2021-06-22 (×14): 1 via TOPICAL
  Filled 2021-06-08: qty 44

## 2021-06-08 MED ORDER — BOOST / RESOURCE BREEZE PO LIQD CUSTOM
1.0000 | Freq: Three times a day (TID) | ORAL | Status: DC
  Administered 2021-06-08 – 2021-06-22 (×35): 1 via ORAL

## 2021-06-08 MED ORDER — FLEET ENEMA 7-19 GM/118ML RE ENEM: 1.0000 | ENEMA | Freq: Once | RECTAL | Status: DC | PRN

## 2021-06-08 MED ORDER — TRAMADOL HCL 50 MG PO TABS: 50.0000 mg | ORAL_TABLET | Freq: Three times a day (TID) | ORAL | Status: DC | PRN

## 2021-06-08 NOTE — TOC Transition Note (Signed)
Transition of Care (TOC) - CM/SW Discharge Note ? ? ?Patient Details  ?Name: Matthew [name] ?MRN: [id number] ?Date of Birth: 1941/02/12 ? ?Transition of Care (TOC) CM/SW Contact:  ?Leone Haven, RN ?Phone Number: ?06/08/2021, 9:53 AM ? ? ?Clinical Narrative:    ?Patient for dc to CIR  today . ? ? ?  ?  ? ? ?Patient Goals and CMS Choice ?  ?  ?  ? ?Discharge Placement ?  ?           ?  ?  ?  ?  ? ?Discharge Plan and Services ?  ?  ?           ?  ?  ?  ?  ?  ?  ?  ?  ?  ?  ? ?Social Determinants of Health (SDOH) Interventions ?  ? ? ?Readmission Risk Interventions ? ?  05/10/2021  ?  8:52 AM  ?Readmission Risk Prevention Plan  ?Transportation Screening Complete  ?PCP or Specialist Appt within 5-7 Days Complete  ?Home Care Screening Complete  ?Medication Review (RN CM) Referral to Pharmacy  ? ? ? ? ? ?

## 2021-06-08 NOTE — Progress Notes (Signed)
Matthew Ribas, MD  ?Physician ?Physical Medicine and Rehabilitation ?PMR Pre-admission     ?Signed ?Date of Service:  05/23/2021  3:57 PM ? Related encounter: ED to Hosp-Admission (Current) from 05/13/2021 in [facility] ?  ?Signed    ?  ?Show:Clear all ?[x]Written[x]Templated[x]Copied ? ?Added by: ?[x]Kim Lauver, Vertis Kelch, RN[x]Raulkar, Clide Deutscher, MD[x]Staley, Farley Ly, CCC-SLP ? ?[]Hover for details ?   ?   ?   ?   ?   ?   ?   ?   ?   ?   ?   ?   ?   ?   ?   ?   ?   ?   ?   ?   ?   ?   ?   ?   ?   ?   ?   ?   ?   ?   ?   ?   ?   ?   ?   ?   ?   ?   ?   ?   ?   ?   ?   ?   ?   ?   ?   ?   ?   ?   ?   ?   ?   ?   ?   ?   ?   ?   ?   ?   ?   ?   ?   ?   ?   ?   ?   ?   ?   ?   ?   ?   ?   ?   ?   ?   ?   ?   ?   ?   ?   ?   ?   ?   ?   ?   ?   ?   ?   ?   ?   ?   ?   ?   ?   ?   ?   ?   ?   ?   ?   ?   ?   ?   ?   ?   ?   ?   ?   ?   ?   ?   ?   ?   ?   ?   ?   ?   ?   ?   ?   ?   ?   ?   ?   ?   ?   ?   ?   ?   ?   ?   ?   ?   ?   ?   ?   ?   ?   ?   ?   ?   ?   ?   ?   ?   ?   ?PMR Admission Coordinator Pre-Admission Assessment ?  ?Patient: Matthew [name] is an 81 y.o., male ?MRN: [id number] ?DOB: 04/14/1940 ?Height: 6' 7" (200.7 cm) ?Weight: 80.2 kg ?  ?Insurance Information ?HMO:     PPO:      PCP:      IPA:      80/20: yes     OTHER:  ?PRIMARY: Medicare       Policy#:  [id number]     Subscriber: Pt. ?Phone#: Verified online    Fax#:  ?Pre-Cert#:  Employer:  ?Benefits:  Phone #:      Name:  ?Eff. Date: Parts A ad B effective 07/14/2005 Deduct: $1600      Out of Pocket Max:  None      Life Max: N/A  ?CIR: 100%      SNF: 100 days ?Outpatient: 80%     Co-Pay: 20% ?Home Health: 100%      Co-Pay: none ?DME: 80%     Co-Pay: 20% ?Providers: patient's choice ?Providers: in network  ? ?SECONDARY: [place]       Policy#: [id number]     Phone#:  ?  ?Financial Counselor:       Phone#:  ?  ?The ?Data Collection Information Summary? for patients in Inpatient Rehabilitation Facilities with  attached ?Privacy Act [place] Records? was provided and verbally reviewed with: Patient and Family ?  ?Emergency Contact Information ?Contact Information   ?  ?  Name Relation Home Work Mobile  ?  Matthew [name] Daughter [phone]   [phone]  ?  Matthew [name] Son [phone]   [phone]  ?  x,Matthew [name] Sister     [phone]  ?  ?   ?  ?Current Medical History  ?Patient Admitting Diagnosis: Heart Failure  ?  ?History of Present Illness: 81 year old male with history of CAD, nonischemic cardiomyopathy, chronic biventricular heart failure, severe mitral regurgutation, HTN, HLD and BPH. Recent admit 3/25 until 3/28 for encephalopathy due to UTI and dehydration. Recent cardiology visit with plan on repeating RHC and TEE in a few weeks. Lasix dose increased. ?  ?Presented to ED on 05/13/21 for SOB and chest pains.  CTA chest with evidence of PE. BNP >4,500. He was admitted for management of PE and acute on chronic biventricular heart failure.  ?  ?Cardiology consulted . Developed AKI with attempts at diuresis. Advanced Heart Failure consulted. Started on midodrine. PICC placed for CVP and CO ox monitoring. Diuresed with IV lasix. Later developed lactic acidosis and milrinone added. Attempted to wean but developed recurrent low output.Structural heart team consulted and he underwent mTEER on 06/02/21. He was hypoxic post procedure and intubated. Eventual extubation 4/21. Still with 3+ MR post TEER but improved clinically. Weaned off milrinone on 4/24. CO ox and renal failure remained stable. Hospital course has been complicated by AKI and CKD IIIa and frequent PVC'S/NSVT for which he was started on mexiletine and amiodarone. Felt not to be a candidate for ablation. ?  ?Patient's medical record from [facility] has been reviewed by the rehabilitation admission coordinator and physician. ?  ?Past Medical History  ?    ?Past Medical History:  ?Diagnosis Date  ? CAD (coronary artery  disease)    ? Chronic HFrEF (heart failure with reduced ejection fraction) ([place])    ? Chronic kidney disease, stage 3b ([place])    ? Encephalopathy 04/2021  ?  admitted for metabolic encephalopathy  ? HTN (hypertension)    ? Hyperlipidemia    ? NICM (nonischemic cardiomyopathy) ([place])    ? Obesity    ? Osteoarthritis    ? S/P mitral valve clip implantation 06/02/2021  ?  MitraClip XTW x2 with Dr. Ali Lowe and Dr. Burt Knack  ? Severe mitral regurgitation    ? Thoracic aortic aneurysm (TAA) ([place])    ? Thrombocytopenia ([place])    ?  noted in labs in 2023  ? Tricuspid regurgitation    ?  ?Has the patient had major surgery during 100 days prior to admission? yes ?  ?  Family History   ?family history includes Diabetes in his brother; Hypertension in his father. ?  ?Current Medications ?  ?Current Facility-Administered Medications:  ?  0.9 %  sodium chloride infusion, 250 mL, Intravenous, PRN, Tommie Raymond, NP, Stopped at 06/04/21 [phone] ?  acetaminophen (TYLENOL) tablet 650 mg, 650 mg, Oral, Q4H PRN, Icard, Bradley L, DO ?  amiodarone (PACERONE) tablet 400 mg, 400 mg, Oral, BID, Icard, Bradley L, DO, 400 mg at 06/07/21 2216 ?  apixaban (ELIQUIS) tablet 5 mg, 5 mg, Oral, BID, Icard, Bradley L, DO, 5 mg at 06/07/21 2216 ?  atorvastatin (LIPITOR) tablet 10 mg, 10 mg, Per Tube, Daily, Icard, Bradley L, DO, 10 mg at 06/07/21 0908 ?  chlorhexidine gluconate (MEDLINE KIT) (PERIDEX) 0.12 % solution 15 mL, 15 mL, Mouth Rinse, BID, Ollis, Brandi L, NP, 15 mL at 06/07/21 2220 ?  Chlorhexidine Gluconate Cloth 2 % PADS 6 each, 6 each, Topical, Daily, Kathyrn Drown D, NP, 6 each at 06/07/21 7035 ?  docusate sodium (COLACE) capsule 100 mg, 100 mg, Oral, BID, Icard, Bradley L, DO, 100 mg at 06/07/21 2216 ?  feeding supplement (BOOST / RESOURCE BREEZE) liquid 1 Container, 1 Container, Oral, TID BM, Joette Catching, PA-C, 1 Container at 06/07/21 2220 ?  fentaNYL (SUBLIMAZE) bolus via infusion 25-100 mcg, 25-100 mcg, Intravenous, Q15 min  PRN, Ollis, Brandi L, NP ?  hydrALAZINE (APRESOLINE) injection 5 mg, 5 mg, Intravenous, Q20 Min PRN, Kathyrn Drown D, NP ?  labetalol (NORMODYNE) injection 10 mg, 10 mg, Intravenous, Q10 min PRN, Tommie Raymond, NP ?  leptospermum manuka honey (MEDIHONEY) paste 1 application., 1 application., Topical, Daily, Tommie Raymond, NP, 1 application. at 06/07/21 0093 ?  magnesium oxide (MAG-OX) tablet 400 mg, 400 mg, Oral, Daily, Icard, Bradley L, DO, 400 mg at 06/07/21 8182 ?  mexiletine (MEXITIL) capsule 300 mg, 300 mg, Oral, Q12H, Icard, Bradley L, DO, 300 mg at 06/07/21 2216 ?  midodrine (PROAMATINE) tablet 15 mg, 15 mg, Oral, TID WC, Larey Dresser, MD, 15 mg at 06/07/21 1636 ?  ondansetron (ZOFRAN) injection 4 mg, 4 mg, Intravenous, Q6H PRN, Kathyrn Drown D, NP ?  pantoprazole (PROTONIX) EC tablet 40 mg, 40 mg, Oral, Daily, Icard, Bradley L, DO, 40 mg at 06/07/21 0908 ?  polyethylene glycol (MIRALAX / GLYCOLAX) packet 17 g, 17 g, Oral, Daily, Icard, Bradley L, DO, 17 g at 06/06/21 0838 ?  senna (SENOKOT) tablet 17.2 mg, 2 tablet, Oral, Daily, Icard, Bradley L, DO, 17.2 mg at 06/07/21 0909 ?  sodium chloride flush (NS) 0.9 % injection 10-40 mL, 10-40 mL, Intracatheter, PRN, Kathyrn Drown D, NP, 10 mL at 05/25/21 1101 ?  sodium chloride flush (NS) 0.9 % injection 10-40 mL, 10-40 mL, Intracatheter, Q12H, Icard, Bradley L, DO, 10 mL at 06/07/21 2221 ?  sodium chloride flush (NS) 0.9 % injection 10-40 mL, 10-40 mL, Intracatheter, PRN, Icard, Bradley L, DO ?  sodium chloride flush (NS) 0.9 % injection 3 mL, 3 mL, Intravenous, Q12H, Kathyrn Drown D, NP, 3 mL at 06/07/21 0908 ?  sodium chloride flush (NS) 0.9 % injection 3 mL, 3 mL, Intravenous, PRN, Kathyrn Drown D, NP ?  torsemide [facility]) tablet 40 mg, 40 mg, Oral, Daily, Larey Dresser, MD, 40 mg at 06/07/21 0908 ?  traMADol (ULTRAM) tablet 50 mg, 50 mg, Oral, Q8H PRN, Kathyrn Drown D, NP, 50 mg at 06/03/21 2033 ?  ?Patients Current Diet:  ?Diet Order   ?   ?         ?  Diet - low sodium heart healthy       ?  ?    Diet regular Room service appropriate? Yes with Assist; Fluid consistency: Thin  Diet effective now       ?  ?  ?   ?  ?  ?   ?  ?Precautions / R

## 2021-06-08 NOTE — Progress Notes (Signed)
Occupational Therapy Treatment ?Patient Details ?Name: Matthew [name] ?MRN: [id number] ?DOB: 1940-09-22 ?Today's Date: 06/08/2021 ? ? ?History of present illness 81 y.o. male adm 3/31 with SOB with RLL PE treated with Heparin> Eliquis. Admission 3/25-28/2023 with AMS and UTI. L radial heart cath completed on 4/5. S/p transcatheter edge to edge mitral valve repair 4/20, postprocedure developed AMS and hypoxia into the 60s and was intubated. Extubated 4/21. PMHx: CAD, NICM, CHF, HTN, OA, obesity ?  ?OT comments ? Patient continues to make good progress with OT treatment with min assist to get to EOB from supine and to stand from raised bed for peri area cleaning and to transfer to recliner with mod assist and using RW. Patient provided UE HEP and level one therapy band. Patient instructed on UE strengthening exercises with min verbal cues and occasional physical assistance for correct form. Patient is expected to discharge to [facility] today for continued OT.   ? ?Recommendations for follow up therapy are one component of a multi-disciplinary discharge planning process, led by the attending physician.  Recommendations may be updated based on patient status, additional functional criteria and insurance authorization. ?   ?Follow Up Recommendations ? Acute inpatient rehab (3hours/day)  ?  ?Assistance Recommended at Discharge Frequent or constant Supervision/Assistance  ?Patient can return home with the following ? A little help with bathing/dressing/bathroom;Assistance with cooking/housework;Direct supervision/assist for financial management;Direct supervision/assist for medications management;Assist for transportation;A lot of help with walking and/or transfers ?  ?Equipment Recommendations ? None recommended by OT  ?  ?Recommendations for Other Services   ? ?  ?Precautions / Restrictions Precautions ?Precautions: Fall ?Precaution Comments: L radial heart cath on 4/5 ?Restrictions ?Weight Bearing Restrictions: No  ? ? ?   ? ?Mobility Bed Mobility ?Overal bed mobility: Needs Assistance ?Bed Mobility: Supine to Sit ?  ?  ?Supine to sit: Min assist, HOB elevated ?  ?  ?General bed mobility comments: increased time and assistance scooting forward ?  ? ?Transfers ?Overall transfer level: Needs assistance ?Equipment used: Rolling [place] (2 wheels) ?Transfers: Sit to/from Stand, Bed to chair/wheelchair/BSC ?Sit to Stand: Min assist, From elevated surface ?  ?  ?Step pivot transfers: Mod assist ?  ?  ?General transfer comment: min assist from raised bed with and mod assist to step pivot into recliner ?  ?  ?Balance Overall balance assessment: Needs assistance ?Sitting-balance support: Feet supported, No upper extremity supported ?Sitting balance-[name] Scale: Fair ?  ?  ?Standing balance support: During functional activity, Reliant on assistive device for balance, Single extremity supported, Bilateral upper extremity supported ?Standing balance-[name] Scale: Poor ?Standing balance comment: reliant on at least one extremity support for balance ?  ?  ?  ?  ?  ?  ?  ?  ?  ?  ?  ?   ? ?ADL either performed or assessed with clinical judgement  ? ?ADL Overall ADL's : Needs assistance/impaired ?  ?  ?Grooming: Wash/dry hands;Wash/dry face;Set up;Sitting ?Grooming Details (indicate cue type and reason): seated in recliner ?  ?  ?Lower Body Bathing: Moderate assistance;Sit to/from stand ?Lower Body Bathing Details (indicate cue type and reason): stood to clean peri area with mod assist. ?  ?  ?  ?  ?  ?  ?  ?  ?  ?  ?  ?General ADL Comments: stood for peri area cleaning and grooming seated ?  ? ?Extremity/Trunk Assessment   ?  ?  ?  ?  ?  ? ?Vision   ?  ?  ?  Perception   ?  ?Praxis   ?  ? ?Cognition Arousal/Alertness: Awake/alert ?Behavior During Therapy: Flat affect, Impulsive ?Overall Cognitive Status: Difficult to assess ?Area of Impairment: Safety/judgement, Awareness, Following commands, Memory, Problem solving ?  ?  ?  ?  ?  ?  ?  ?  ?  ?   ?Memory: Decreased recall of precautions, Decreased short-term memory ?Following Commands: Follows one step commands inconsistently, Follows one step commands with increased time ?Safety/Judgement: Decreased awareness of safety, Decreased awareness of deficits ?Awareness: Intellectual ?Problem Solving: Slow processing, Decreased initiation, Difficulty sequencing, Requires verbal cues, Requires tactile cues ?General Comments: asked questions for hand placement for sit to stand and [place] use ?  ?  ?   ?Exercises Exercises: General Upper Extremity ?General Exercises - Upper Extremity ?Shoulder Flexion: Strengthening, AROM, 10 reps ?Shoulder ABduction: Strengthening, Both, 10 reps, Theraband ?Theraband Level (Shoulder Abduction): Level 1 (Yellow) ?Elbow Flexion: Strengthening, Both, 10 reps, Seated, Theraband ?Theraband Level (Elbow Flexion): Level 1 (Yellow) ?Elbow Extension: Strengthening, Both, 10 reps, Seated, Theraband ?Theraband Level (Elbow Extension): Level 1 (Yellow) ? ?  ?Shoulder Instructions   ? ? ?  ?General Comments    ? ? ?Pertinent Vitals/ Pain       Pain Assessment ?Pain Assessment: Faces ?Faces Pain Scale: Hurts a little bit ?Pain Location: right lower back/buttock ?Pain Descriptors / Indicators: Discomfort, Grimacing, Sore ?Pain Intervention(s): Monitored during session, Repositioned ? ?Home Living   ?  ?  ?  ?  ?  ?  ?  ?  ?  ?  ?  ?  ?  ?  ?  ?  ?  ?  ? ?  ?Prior Functioning/Environment    ?  ?  ?  ?   ? ?Frequency ? Min 2X/week  ? ? ? ? ?  ?Progress Toward Goals ? ?OT Goals(current goals can now be found in the care plan section) ? Progress towards OT goals: Progressing toward goals ? ?Acute Rehab OT Goals ?Patient Stated Goal: get better ?OT Goal Formulation: With patient ?Time For Goal Achievement: 06/20/21 ?Potential to Achieve Goals: Good ?ADL Goals ?Pt Will Perform Eating: with set-up;sitting ?Pt Will Perform Grooming: with set-up;with supervision;sitting;standing ?Pt Will Perform Lower Body  Bathing: with supervision;sit to/from stand ?Pt Will Perform Upper Body Dressing: with set-up;sitting ?Pt Will Perform Lower Body Dressing: with supervision;sit to/from stand ?Pt Will Transfer to Toilet: with supervision;bedside commode;ambulating ?Pt Will Perform Toileting - Clothing Manipulation and hygiene: with supervision;sitting/lateral leans;sit to/from stand ?Additional ADL Goal #1: Pt will verbalie 3 strateiges to manage heart failure to reduce risk of readmission  ?Plan Discharge plan remains appropriate   ? ?Co-evaluation ? ? ?   ?  ?  ?  ?  ? ?  ?AM-PAC OT "6 Clicks" Daily Activity     ?Outcome Measure ? ? Help from another person eating meals?: A Little ?Help from another person taking care of personal grooming?: A Little ?Help from another person toileting, which includes using toliet, bedpan, or urinal?: A Lot ?Help from another person bathing (including washing, rinsing, drying)?: A Lot ?Help from another person to put on and taking off regular upper body clothing?: A Little ?Help from another person to put on and taking off regular lower body clothing?: A Lot ?6 Click Score: 15 ? ?  ?End of Session Equipment Utilized During Treatment: Gait belt;Rolling [place] (2 wheels) ? ?OT Visit Diagnosis: Unsteadiness on feet (R26.81);Muscle weakness (generalized) (M62.81);Other symptoms and signs involving cognitive function ?  ?Activity  Tolerance Patient tolerated treatment well ?  ?Patient Left in chair;with call bell/phone within reach;with chair alarm set ?  ?Nurse Communication Mobility status ?  ? ?   ? ?Time: [id number] ?OT Time Calculation (min): 25 min ? ?Charges: OT General Charges ?$OT Visit: 1 Visit ?OT Treatments ?$Self Care/Home Management : 8-22 mins ?$Therapeutic Exercise: 8-22 mins ? ?Lodema Hong, OTA ?Acute Rehabilitation Services  ?Pager [phone] ?Office [phone] ? ? ?[place] ?06/08/2021, 11:59 AM ?

## 2021-06-08 NOTE — Progress Notes (Signed)
RN attempted to call to give report for CIR admission. Will call back in 30 mins. ?

## 2021-06-08 NOTE — Progress Notes (Signed)
IVT present to discontinue PICC.  Pt in chair.  RN to update consult when pt back in bed. ?

## 2021-06-08 NOTE — Progress Notes (Signed)
Patient ID: Matthew [name], male   DOB: Dec 29, 1940, 81 y.o.   MRN: [id number] ?Pt arrived to [address] 18. Oriented to rehab and policies reviewed. Pt in agreement. Vitals and skin assessment completed. Bed alarm on, call light in reach. Needs identified and met. ?Sheela Stack, LPN  ?

## 2021-06-08 NOTE — Progress Notes (Signed)
Inpatient Rehabilitation Admission Medication Review by a Pharmacist ? ?A complete drug regimen review was completed for this patient to identify any potential clinically significant medication issues. ? ?High Risk Drug Classes Is patient taking? Indication by Medication  ?Antipsychotic Yes Compazine- N/V  ?Anticoagulant Yes Apixaban- PAF  ?Antibiotic No   ?Opioid Yes Tramadol- acute pain  ?Antiplatelet No   ?Hypoglycemics/insulin No   ?Vasoactive Medication Yes Amiodarone, mexiletine, torsemide, midodrine- arrhythmias, fluid removal, hypotension  ?Chemotherapy No   ?Other Yes Lipitor- HLD ?Protonix- GERD ?Trazodone- sleep  ? ? ? ?Type of Medication Issue Identified Description of Issue Recommendation(s)  ?Drug Interaction(s) (clinically significant) ?    ?Duplicate Therapy ?    ?Allergy ?    ?No Medication Administration End Date ?    ?Incorrect Dose ?    ?Additional Drug Therapy Needed ?    ?Significant med changes from prior encounter (inform family/care partners about these prior to discharge).    ?Other ? PTA meds: ?Marcelline Deist ?Toprol XL ?Aspirin ?Entresto ?spironolactone Restart PTA meds when and if clinically necessary during CIR admission or at time of discharge, if warranted  ? ? ?Clinically significant medication issues were identified that warrant physician communication and completion of prescribed/recommended actions by midnight of the next day:  No ? ?Time spent performing this drug regimen review (minutes):  30 ? ? ?Meshulem Onorato BS, PharmD, BCPS ?Clinical Pharmacist ?06/09/2021 4:34 AM ? ?Contact: [phone] after 3 PM ? ?"Be curious, not judgmental..." -Debbora Dus ?

## 2021-06-08 NOTE — Care Management Important Message (Signed)
Important Message ? ?Patient Details  ?Name: Matthew [name] ?MRN: [id number] ?Date of Birth: [birth year] ? ? ?Medicare Important Message Given:  Yes ? ? ? ? ?Renie Ora ?06/08/2021, 10:19 AM ?

## 2021-06-08 NOTE — Progress Notes (Signed)
Inpatient Rehabilitation Admission Medication Review by a Pharmacist ? ?A complete drug regimen review was completed for this patient to identify any potential clinically significant medication issues. ? ?High Risk Drug Classes Is patient taking? Indication by Medication  ?Antipsychotic Yes Compazine- N/V  ?Anticoagulant Yes Apixaban- PAF  ?Antibiotic No   ?Opioid Yes Tramadol- acute pain  ?Antiplatelet No   ?Hypoglycemics/insulin No   ?Vasoactive Medication Yes Amiodarone, mexiletine, torsemide, midodrine- arrhythmias, fluid removal, hypotension  ?Chemotherapy No   ?Other Yes Lipitor- HLD ?Protonix- GERD  ? ? ? ?Type of Medication Issue Identified Description of Issue Recommendation(s)  ?Drug Interaction(s) (clinically significant) ?    ?Duplicate Therapy ?    ?Allergy ?    ?No Medication Administration End Date ?    ?Incorrect Dose ?    ?Additional Drug Therapy Needed ?    ?Significant med changes from prior encounter (inform family/care partners about these prior to discharge).    ?Other ? PTA meds: ?Marcelline Deist ?Toprol XL ?Aspirin ?Entresto ?spironolactone Restart PTA meds when and if clinically necessary during CIR admission or at time of discharge, if warranted  ? ? ?Clinically significant medication issues were identified that warrant physician communication and completion of prescribed/recommended actions by midnight of the next day:  No ? ?Time spent performing this drug regimen review (minutes):  30 ? ? ?Earle Reome BS, PharmD, BCPS ?Clinical Pharmacist ?06/08/2021 12:14 PM ? ?Contact: [phone] after 3 PM ? ?"Be curious, not judgmental..." -Debbora Dus ?

## 2021-06-08 NOTE — H&P (Signed)
? ? ?Physical Medicine and Rehabilitation Admission H&P ? ?  ?CC: Debility secondary to nonischemic cardiomyopathy, chronic biventricular heart failure ? ?HPI: Matthew [name] is an 81 year old male with a history of chronic biventricular heart failure who presented to the emergency department on 05/13/2021 for significant shortness of breath x1 hour while watching TV.  He had been evaluated that morning in the heart failure clinic and plans were in place for right and left heart catheterization and TEE in the next few weeks per Dr. Haroldine Laws.  He was to continue Toprol-XL, Enestro, spironolactone and [place].  He had recently been admitted with acute kidney injury on 3/26 and discharged on 3/28.  CTA of the chest was performed concerning for emboli.  Frequent PVCs monitored. He was started on heparin infusion and admitted to the medicine service for further management.  ? ?He underwent multiple imaging procedures including bilateral lower extremity venous duplex without evidence of DVT, echocardiogram with findings of EF approximately 25% and severely decreased left ventricular function, bilateral lower extremity ABIs with noncompressible vessels.  Right and left heart catheterization on 4/5 with findings of severe nonischemic cardiomyopathy.  The patient is not a candidate for MitraClip currently.  Dr. Haroldine Laws recommended suppressing PVCs more fully as they may be contributing to his LV dysfunction: serum potassium level maintained greater than 4 and magnesium level greater than 2. ? ?GDMT held secondary to hypotension. Cardiology consulted, Dr. Gardiner Rhyme, on 4/1.  Started on Lasix intravenously.  Serum creatinine monitored in the setting of chronic kidney disease stage IIIb.  Heparin infusion transitioned to Eliquis 5 mg twice daily.  Cardiac MRI done to rule out sarcoidosis.  NSVT and PVCs continued but improved on amiodarone infusion and mexiletine.  Electrolytes monitored. Lactic acidosis resolved with  milrinone. Repeat TEE 4/20 with estimated EF 20-25%.  He underwent transcatheter edge to edge MVR by Dr. Lenna Sciara on 4/20. He developed post-procedure hypoxia and was reintubated in the holding area. He was extubated the following day. He was transferred to heart failure service. Not a candidate for PVC ablation. Progressed with OT and PT. Tolerating diet. The patient requires inpatient medicine and rehabilitation evaluations and services for ongoing dysfunction secondary to multiple medical problems including heart failure. ? ?Lives locally with his daughter since his wife's death approximately 6 years ago.  He has a son who lives nearby.  He is retired from [employer] as a Therapist, occupational.  Was using rolling [place] for assistance prior to admission. Scraped anterior right lower leg prior to admission. ?S/p left knee arthroscopy. ?Prior left upper extremity injury secondary to fall>>has limited shoulder ROM. Currently with left knee swelling.  ? ? ?Review of Systems  ?Constitutional:  Negative for chills and fever.  ?HENT:  Positive for hearing loss.   ?Eyes:  Negative for double vision.  ?     Wears galsses  ?Respiratory:  Negative for cough and shortness of breath.   ?Cardiovascular:  Negative for chest pain and orthopnea.  ?Gastrointestinal:  Negative for abdominal pain, nausea and vomiting.  ?Genitourinary:  Negative for dysuria.  ?     Has condom catheter because it takes staff 30-45 minutes to help him urinate  ?Musculoskeletal:  Positive for back pain and joint pain.  ?     + osteoarthritis multiple joints  ?Neurological:  Negative for dizziness and headaches.  ?Psychiatric/Behavioral:  Negative for depression. The patient is not nervous/anxious.   ?Past Medical History:  ?Diagnosis Date  ? CAD (coronary artery disease)   ?  Chronic HFrEF (heart failure with reduced ejection fraction) ([place])   ? Chronic kidney disease, stage 3b ([place])   ? Encephalopathy 04/2021  ? admitted for metabolic  encephalopathy  ? HTN (hypertension)   ? Hyperlipidemia   ? NICM (nonischemic cardiomyopathy) ([place])   ? Obesity   ? Osteoarthritis   ? S/P mitral valve clip implantation 06/02/2021  ? MitraClip XTW x2 with Dr. Ali Lowe and Dr. Burt Knack  ? Severe mitral regurgitation   ? Thoracic aortic aneurysm (TAA) ([place])   ? Thrombocytopenia ([place])   ? noted in labs in 2023  ? Tricuspid regurgitation   ? ?Past Surgical History:  ?Procedure Laterality Date  ? CHONDROPLASTY Left 01/11/2017  ? Procedure: CHONDROPLASTY of medial and patella compartment;  Surgeon: Dorna Leitz, MD;  Location: [facility];  Service: Orthopedics;  Laterality: Left;  ? KNEE ARTHROSCOPY Left 01/11/2017  ? Procedure: ARTHROSCOPIC IRRIGATION AND DEBRIDMENT KNEE;  Surgeon: Dorna Leitz, MD;  Location: [facility];  Service: Orthopedics;  Laterality: Left;  ? KNEE SURGERY    ? MENISECTOMY Left 01/11/2017  ? Procedure: Lateral MENISECTOMY;  Surgeon: Dorna Leitz, MD;  Location: [facility];  Service: Orthopedics;  Laterality: Left;  ? MITRAL VALVE REPAIR N/A 06/02/2021  ? Procedure: MITRAL VALVE REPAIR;  Surgeon: Early Osmond, MD;  Location: [facility];  Service: Cardiovascular;  Laterality: N/A;  ? RIGHT/LEFT HEART CATH AND CORONARY ANGIOGRAPHY N/A 05/18/2021  ? Procedure: RIGHT/LEFT HEART CATH AND CORONARY ANGIOGRAPHY;  Surgeon: Jolaine Artist, MD;  Location: [facility];  Service: Cardiovascular;  Laterality: N/A;  ? SYNOVECTOMY Left 01/11/2017  ? Procedure: Four compartment SYNOVECTOMY;  Surgeon: Dorna Leitz, MD;  Location: [facility];  Service: Orthopedics;  Laterality: Left;  ? TEE WITHOUT CARDIOVERSION N/A 05/18/2021  ? Procedure: TRANSESOPHAGEAL ECHOCARDIOGRAM (TEE);  Surgeon: Jolaine Artist, MD;  Location: [facility];  Service: Cardiovascular;  Laterality: N/A;  ? TEE WITHOUT CARDIOVERSION N/A 06/02/2021  ? Procedure: TRANSESOPHAGEAL ECHOCARDIOGRAM (TEE);  Surgeon: Early Osmond, MD;  Location: [facility];  Service: Cardiovascular;   Laterality: N/A;  ? ?Family History  ?Problem Relation Age of Onset  ? Hypertension Father   ? Diabetes Brother   ? Heart attack Neg Hx   ? Stroke Neg Hx   ? ?Social History:  reports that he has never smoked. He has never used smokeless tobacco. He reports that he does not drink alcohol and does not use drugs. ?Allergies:  ?No Active Allergies ? ?No medications prior to admission.  ? ?  ? Home: ?Home Living ?Family/patient expects to be discharged to:: Private residence ?Living Arrangements: Children, Other (Comment) ?Available Help at Discharge: Family, Available 24 hours/day ?Type of Home: House ?Home Access: Ramped entrance ?Home Layout: One level ?Bathroom Shower/Tub: Walk-in shower ?Bathroom Toilet: Standard ?Bathroom Accessibility: Yes ?Home Equipment: Rollator (4 wheels), BSC/3in1, Shower seat ? Lives With: Family ?  ?Functional History: ?Prior Function ?Prior Level of Function : Needs assist ?Physical Assist : Mobility (physical), ADLs (physical) ?Mobility Comments: walks with rollator in home ?ADLs Comments: pt reports mod I for bathing and dressing, family performing cooking/cleaning ?  ?Functional Status:  ?Mobility: ?Bed Mobility ?Overal bed mobility: Needs Assistance ?Bed Mobility: Supine to Sit ?Supine to sit: Supervision ?General bed mobility comments: HOB flat. Pt performed posterior supine scooting using bilateral extremity only due to decreased UE ROM. Unable to reach overhead rails. Needed +2 maxA. ?Transfers ?Overall transfer level: Needs assistance ?Equipment used: Rolling [place] (2 wheels) ?Transfers: Sit to/from Stand,  Bed to chair/wheelchair/BSC ?Sit to Stand: Min guard, From elevated surface ?Bed to/from chair/wheelchair/BSC transfer type:: Step pivot ?Step pivot transfers: Min guard ?General transfer comment: Incr time and effort to rise. Min guard for safety and lines. ?Ambulation/Gait ?Ambulation/Gait assistance: Min assist ?Gait Distance (Feet): 60 Feet ?Assistive device: Rollator (4  wheels) ?Gait Pattern/deviations: Trunk flexed, Step-through pattern, Decreased stride length, Knee flexed in stance - right, Knee flexed in stance - left ?General Gait Details: Assist for balance,safety and lines. Ver

## 2021-06-08 NOTE — Progress Notes (Signed)
Brief discussion of watching for fluid and low sodium. Pt somewhat receptive. Encouraged mobility at CIR. He could eventually do CRPII with an outpatient referral. ?[id number] ?Ethelda Chick CES, ACSM ?1:57 PM ?06/08/2021 ? ?

## 2021-06-08 NOTE — Progress Notes (Addendum)
Patient ID: Matthew [name], male   DOB: [birth year], 81 y.o.   MRN: [id number] ?  ? ? Advanced Heart Failure Rounding Note ? ?PCP-Cardiologist: Larae Grooms, MD  ? ?Subjective:   ? ?04/05: RHC Low filling pressures with normal CO.  ?04/06: Seen by structural team for consideration of mitral TEER ?04/12: Started on milrinone 0.125, lactic acid 2.8>>1.7>1.6 ?04/16: Milrinone discontinued  ?04/19: CO-OX low. Milrinone 0.125 mcg restarted.  ?04/20: S/P MTEER ?04/21: Extubated ?04/24: Milrinone off ? ? ?CO-OX stable 72%.  ? ?Feeling better. Denies SOB  ?Objective:   ?Weight Range: ?80.2 kg ?Body mass index is 19.92 kg/m?.  ? ?Vital Signs:   ?Temp:  [97.5 ?F (36.4 ?C)-98.6 ?F (37 ?C)] 98 ?F (36.7 ?C) (04/26 0740) ?Pulse Rate:  [59-72] 59 (04/26 0740) ?Resp:  [16-18] 18 (04/26 0740) ?BP: (97-113)/(70-80) 100/77 (04/26 0740) ?SpO2:  [96 %-100 %] 99 % (04/26 0740) ?Weight:  [80.2 kg] 80.2 kg (04/26 0400) ?Last BM Date : 06/07/21 ? ?Weight change: ?Filed Weights  ? 06/06/21 0400 06/07/21 0314 06/08/21 0400  ?Weight: 82.3 kg 82.3 kg 80.2 kg  ? ? ?Intake/Output:  ? ?Intake/Output Summary (Last 24 hours) at 06/08/2021 0942 ?Last data filed at 06/08/2021 0346 ?Gross per 24 hour  ?Intake 714 ml  ?Output 2025 ml  ?Net -1311 ml  ? ?  ? ? ?Physical Exam  ?General:  Sitting in the chair.  No resp difficulty ?HEENT: normal ?Neck: supple. no JVD. Carotids 2+ bilat; no bruits. No lymphadenopathy or thryomegaly appreciated. ?Cor: PMI nondisplaced. Regular rate & rhythm. No rubs, gallops. 2/6 MR  ?Respiratory: Lungs  clear ?Abdomen: soft, nontender, nondistended. No hepatosplenomegaly. No bruits or masses. Good bowel sounds. ?Extremities: no cyanosis, clubbing, rash, edema. RUE PICC  ?Neuro: alert & orientedx3, cranial nerves grossly intact. moves all 4 extremities w/o difficulty. Affect pleasant ? ?TELEMETRY: SR 60-70s with occasional PVCs.  ? ?Labs  ?  ?CBC ?Recent Labs  ?  06/07/21 ?0330 06/08/21 ?0405  ?WBC 7.0 7.6  ?HGB 12.1* 12.4*   ?HCT 37.6* 39.5  ?MCV 81.2 81.1  ?PLT 152 161  ? ? ?Basic Metabolic Panel ?Recent Labs  ?  06/06/21 ?0421 06/07/21 ?0330 06/07/21 ?1210 06/08/21 ?0405  ?NA 136 135  --  134*  ?K 4.8 5.3* 5.2* 4.8  ?CL 102 100  --  96*  ?CO2 27 28  --  30  ?GLUCOSE 104* 96  --  87  ?BUN 51* 55*  --  54*  ?CREATININE 2.13* 2.19*  --  2.10*  ?CALCIUM 8.8* 9.0  --  9.2  ?MG 2.2  --   --   --   ? ?Liver Function Tests ?No results for input(s): AST, ALT, ALKPHOS, BILITOT, PROT, ALBUMIN in the last 72 hours. ?No results for input(s): LIPASE, AMYLASE in the last 72 hours. ?Cardiac Enzymes ?No results for input(s): CKTOTAL, CKMB, CKMBINDEX, TROPONINI in the last 72 hours. ? ?BNP: ?BNP (last 3 results) ?Recent Labs  ?  05/09/21 ?5638 05/10/21 ?0205 05/13/21 ?2142  ?BNP >4,500.0* >4,500.0* >4,500.0*  ? ? ?ProBNP (last 3 results) ?No results for input(s): PROBNP in the last 8760 hours. ? ? ?D-Dimer ?No results for input(s): DDIMER in the last 72 hours. ?Hemoglobin A1C ?No results for input(s): HGBA1C in the last 72 hours. ?Fasting Lipid Panel ?No results for input(s): CHOL, HDL, LDLCALC, TRIG, CHOLHDL, LDLDIRECT in the last 72 hours. ?Thyroid Function Tests ?No results for input(s): TSH, T4TOTAL, T3FREE, THYROIDAB in the last 72 hours. ? ?Invalid input(s):  FREET3 ? ?Other results: ? ? ?Imaging  ? ? ?No results found. ? ? ?Medications:   ? ? ?Scheduled Medications: ? amiodarone  400 mg Oral BID  ? apixaban  5 mg Oral BID  ? atorvastatin  10 mg Per Tube Daily  ? chlorhexidine gluconate (MEDLINE KIT)  15 mL Mouth Rinse BID  ? Chlorhexidine Gluconate Cloth  6 each Topical Daily  ? docusate sodium  100 mg Oral BID  ? feeding supplement  1 Container Oral TID BM  ? leptospermum manuka honey  1 application. Topical Daily  ? magnesium oxide  400 mg Oral Daily  ? mexiletine  300 mg Oral Q12H  ? midodrine  15 mg Oral TID WC  ? pantoprazole  40 mg Oral Daily  ? polyethylene glycol  17 g Oral Daily  ? senna  2 tablet Oral Daily  ? sodium chloride flush   10-40 mL Intracatheter Q12H  ? sodium chloride flush  3 mL Intravenous Q12H  ? torsemide  40 mg Oral Daily  ? ? ?Infusions: ? sodium chloride Stopped (06/04/21 0847)  ? ? ?PRN Medications: ?sodium chloride, acetaminophen, fentaNYL, hydrALAZINE, labetalol, ondansetron (ZOFRAN) IV, sodium chloride flush, sodium chloride flush, sodium chloride flush, traMADol ? ? ? ?Patient Profile  ? ?81 y.o. male with history of chronic biventricular HF, NICM, CAD, severe MR/TR, HTN, obesity and multiple recent admissions, now admitted w/ acute PE and a/c CHF.  ? ?Echo this admission, EF 25%, mildly dilated RV with moderately decreased systolic function, severe MR and TR. IVC dilated.  ? ?Assessment/Plan  ? ?1. Acute on chronic Biventricular HF   ?- ECHO 04/2020 EF 25-30% with mild MR.   ?- Echo 04/04/21 EF LVEF 20-25%, RV moderately reduced, LA/RA severely dilated, and severe MR.  ?- Echo per Dr. 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 ?- cMRI c/w severe NICM w/ basal septal midwall LGE  ?- Elevated lactic acid 2.8 on 04/12. Started on milrinone 0.125 with improvement in lactic acidosis. Milrinone discontinued 4/16. Milrinone restarted 4/19. Improved CO-OX  ?- Milrinone weaned off 04/24. ?- Volume status stable. CO-OX stable.  ?- Continue current dose of torsemide. . ?- GDMT limited d/t hypotension and AKI. Continue midodrine 15 mg TID with soft BP to help w/ renal perfusion  ?- Stopped Toprol XL with soft BP.   ?- Off Spiro w/ elevated K and SCr  ?- Still with frequent PVCs, but there seems to be some improvement post MTEER. Have discussed with EP. He is not a candidate for PVC ablation.  ?- S/P MTEER but still  with 3+ MR.  ?- Remove PICC  ? ?2. Lactic acidosis ?- Suspect d/t low-output HF ?- Resolved with milrinone ?- BCx NGTD procalcitonin < 0.10 X 2 ?- No leukocytosis, AF, CXR 04/12 with no acute process ?- Milrinone stopped 4/16 . CO-OX down so milrinone restarted . CO-OX stable today.  ?- Milrinone off 04/24. Follow closely.  ? ?3. CAD ?- LHC w/ mild non-obstructive CAD ?- No chest pain.  ?- Continue statin ?  ?4. Mitral Regurgitation ?- Severe on echo this admit and last echo 02/23 ?- TEE showed mild restriction of P2. Severe central/posterior MR due to P2  restriction and annular dilation ?- functional, severely dilated LA and LV  ?- Seen by structural team.  ?- S/P MTEER => still with 3+ MR.  ?  ?5. Tricuspid Regurgitation  ?- moderate on TEE  ?- functional, severely dilated RA and RV   ?- HF optimization per above  ?  ?6. Acute PE ?- On Eliquis.  ?- No evidence of DVT on [place] ?- ? 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  ?7. Hyperlipidemia ?- Continue statin ?  ?8. AKI on CKD IIIa ?- Scr  baseline 1.4-1.7 last few months, 2.1  ?Suspect cardiorenal syndrome. ?-Improved with inotrope support ?  ?9. NSVT/PVCs ?- suspect contributing to CM , need to suppress. ?- Ongoing high PVC burden. Slightly improved post MTEER ?- continue mexiletine 300 mg BID  ?- seen by EP 04/12, amiodarone increased to 400 mg BID. See above ?- Keep K > 4 and Mag > 2 ?- per EP, he is not an ablation candidate ? ?10. Hyponatremia ?- limit FW ?- Na 134  ? ?11. Hyperkalemia ?- Received Lokelma earlier this admit ?- K 4.8 . Not on any meds that cause hyperkalemia. ? ? ?12. Debility ?- D/C to CIR today. ? ?Remove PICC   ? ?Darrick Grinder NP-C ?06/08/2021 ?9:42 AM ? ?Patient seen and examined with the above-signed Advanced Practice Provider and/or Housestaff. I personally reviewed laboratory data, imaging studies and relevant notes. I independently examined the patient and formulated the important aspects of the  plan. I have edited the note to reflect any of my changes or salient points. I have personally discussed the plan with the patient and/or family. ? ?Has been off milrinone x 2 days. Co-ox stable. Symptoms

## 2021-06-08 NOTE — Progress Notes (Signed)
Mobility Specialist Progress Note: ? ? 06/08/21 1050  ?Mobility  ?Activity  ?(HEP/Chair level exercises)  ?Range of Motion/Exercises Active;Passive;All extremities  ?Assistive Device None  ?Activity Response Tolerated well  ?$Mobility charge 1 Mobility  ? ?Pt agreeable to mobility session. Performed chair-level exercises with all extremities. Pt tolerated well. Left in chair with all needs met.  ? ?Nelta Numbers ?Acute Rehab ?Phone: [phone] ?Office Phone: [phone] ? ?

## 2021-06-08 NOTE — Progress Notes (Signed)
Inpatient Rehabilitation Admissions Coordinator  ? ?I have medical clearance from Dr Haroldine Laws that patient can discharge to [facility] today. I met with patient at bedside and he is in agreement. I contacted his daughter, Di Kindle, by phone and she is in agreement to admit today. I will make the arrangements. Acute team and Care team made aware. ? ?Danne Baxter, RN, MSN ?Rehab Admissions Coordinator ?([phone] ?06/08/2021 10:34 AM ? ?

## 2021-06-09 ENCOUNTER — Other Ambulatory Visit: Payer: Self-pay | Admitting: Cardiology

## 2021-06-09 DIAGNOSIS — I5043 Acute on chronic combined systolic (congestive) and diastolic (congestive) heart failure: Secondary | ICD-10-CM | POA: Diagnosis not present

## 2021-06-09 DIAGNOSIS — Z9889 Other specified postprocedural states: Secondary | ICD-10-CM

## 2021-06-09 DIAGNOSIS — I5021 Acute systolic (congestive) heart failure: Secondary | ICD-10-CM

## 2021-06-09 DIAGNOSIS — R5381 Other malaise: Secondary | ICD-10-CM | POA: Diagnosis present

## 2021-06-09 DIAGNOSIS — I340 Nonrheumatic mitral (valve) insufficiency: Secondary | ICD-10-CM

## 2021-06-09 LAB — COMPREHENSIVE METABOLIC PANEL
ALT: 31 U/L (ref 0–44)
AST: 55 U/L — ABNORMAL HIGH (ref 15–41)
Albumin: 3.2 g/dL — ABNORMAL LOW (ref 3.5–5.0)
Alkaline Phosphatase: 91 U/L (ref 38–126)
Anion gap: 7 (ref 5–15)
BUN: 54 mg/dL — ABNORMAL HIGH (ref 8–23)
CO2: 31 mmol/L (ref 22–32)
Calcium: 9.4 mg/dL (ref 8.9–10.3)
Chloride: 97 mmol/L — ABNORMAL LOW (ref 98–111)
Creatinine, Ser: 2.11 mg/dL — ABNORMAL HIGH (ref 0.61–1.24)
GFR, Estimated: 31 mL/min — ABNORMAL LOW (ref >60)
Glucose, Bld: 88 mg/dL (ref 70–99)
Potassium: 4.6 mmol/L (ref 3.5–5.1)
Sodium: 135 mmol/L (ref 135–145)
Total Bilirubin: 0.9 mg/dL (ref 0.3–1.2)
Total Protein: 7.2 g/dL (ref 6.5–8.1)

## 2021-06-09 LAB — CBC WITH DIFFERENTIAL/PLATELET
Abs Immature Granulocytes: 0.02 10*3/uL (ref 0.00–0.07)
Basophils Absolute: 0 10*3/uL (ref 0.0–0.1)
Basophils Relative: 0 %
Eosinophils Absolute: 0.1 10*3/uL (ref 0.0–0.5)
Eosinophils Relative: 2 %
HCT: 43.5 % (ref 39.0–52.0)
Hemoglobin: 14.2 g/dL (ref 13.0–17.0)
Immature Granulocytes: 0 %
Lymphocytes Relative: 15 %
Lymphs Abs: 1 10*3/uL (ref 0.7–4.0)
MCH: 26.5 pg (ref 26.0–34.0)
MCHC: 32.6 g/dL (ref 30.0–36.0)
MCV: 81.2 fL (ref 80.0–100.0)
Monocytes Absolute: 0.8 10*3/uL (ref 0.1–1.0)
Monocytes Relative: 11 %
Neutro Abs: 4.8 10*3/uL (ref 1.7–7.7)
Neutrophils Relative %: 72 %
Platelets: 154 10*3/uL (ref 150–400)
RBC: 5.36 MIL/uL (ref 4.22–5.81)
RDW: 20.9 % — ABNORMAL HIGH (ref 11.5–15.5)
WBC: 6.8 10*3/uL (ref 4.0–10.5)
nRBC: 0 % (ref 0.0–0.2)

## 2021-06-09 LAB — MAGNESIUM: Magnesium: 2.1 mg/dL (ref 1.7–2.4)

## 2021-06-09 NOTE — Plan of Care (Signed)
?  Problem: RH Problem Solving ?Goal: LTG Patient will demonstrate problem solving for (SLP) ?Description: LTG:  Patient will demonstrate problem solving for basic/complex daily situations with cues  (SLP) ?Flowsheets (Taken 06/09/2021 1740) ?LTG: Patient will demonstrate problem solving for (SLP): Basic daily situations ?LTG Patient will demonstrate problem solving for: ? Minimal Assistance - Patient > 75% ? Supervision ?  ?Problem: RH Memory ?Goal: LTG Patient will use memory compensatory aids to (SLP) ?Description: LTG:  Patient will use memory compensatory aids to recall biographical/new, daily complex information with cues (SLP) ?Flowsheets (Taken 06/09/2021 1740) ?LTG: Patient will use memory compensatory aids to (SLP): ? Supervision ? Minimal Assistance - Patient > 75% ?  ?Problem: RH Awareness ?Goal: LTG: Patient will demonstrate awareness during functional activites type of (SLP) ?Description: LTG: Patient will demonstrate awareness during functional activites type of (SLP) ?Flowsheets (Taken 06/09/2021 1740) ?Patient will demonstrate during cognitive/linguistic activities awareness type of: Intellectual ?LTG: Patient will demonstrate awareness during cognitive/linguistic activities with assistance of (SLP): Supervision ?  ?

## 2021-06-09 NOTE — Progress Notes (Signed)
Inpatient Rehabilitation Care Coordinator ?Assessment and Plan ?Patient Details  ?Name: Matthew [name] ?MRN: [id number] ?Date of Birth: April 08, 1940 ? ?Today's Date: 06/09/2021 ? ?Hospital Problems: Principal Problem: ?  Heart failure ([place]) ?Active Problems: ?  Debility ? ?Past Medical History:  ?Past Medical History:  ?Diagnosis Date  ? CAD (coronary artery disease)   ? Chronic HFrEF (heart failure with reduced ejection fraction) ([place])   ? Chronic kidney disease, stage 3b ([place])   ? Encephalopathy 04/2021  ? admitted for metabolic encephalopathy  ? HTN (hypertension)   ? Hyperlipidemia   ? NICM (nonischemic cardiomyopathy) ([place])   ? Obesity   ? Osteoarthritis   ? S/P mitral valve clip implantation 06/02/2021  ? MitraClip XTW x2 with Dr. Ali Lowe and Dr. Burt Knack  ? Severe mitral regurgitation   ? Thoracic aortic aneurysm (TAA) ([place])   ? Thrombocytopenia ([place])   ? noted in labs in 2023  ? Tricuspid regurgitation   ? ?Past Surgical History:  ?Past Surgical History:  ?Procedure Laterality Date  ? CHONDROPLASTY Left 01/11/2017  ? Procedure: CHONDROPLASTY of medial and patella compartment;  Surgeon: Dorna Leitz, MD;  Location: [facility];  Service: Orthopedics;  Laterality: Left;  ? KNEE ARTHROSCOPY Left 01/11/2017  ? Procedure: ARTHROSCOPIC IRRIGATION AND DEBRIDMENT KNEE;  Surgeon: Dorna Leitz, MD;  Location: [facility];  Service: Orthopedics;  Laterality: Left;  ? KNEE SURGERY    ? MENISECTOMY Left 01/11/2017  ? Procedure: Lateral MENISECTOMY;  Surgeon: Dorna Leitz, MD;  Location: [facility];  Service: Orthopedics;  Laterality: Left;  ? MITRAL VALVE REPAIR N/A 06/02/2021  ? Procedure: MITRAL VALVE REPAIR;  Surgeon: Early Osmond, MD;  Location: [facility];  Service: Cardiovascular;  Laterality: N/A;  ? RIGHT/LEFT HEART CATH AND CORONARY ANGIOGRAPHY N/A 05/18/2021  ? Procedure: RIGHT/LEFT HEART CATH AND CORONARY ANGIOGRAPHY;  Surgeon: Jolaine Artist, MD;  Location: [facility];  Service: Cardiovascular;  Laterality:  N/A;  ? SYNOVECTOMY Left 01/11/2017  ? Procedure: Four compartment SYNOVECTOMY;  Surgeon: Dorna Leitz, MD;  Location: [facility];  Service: Orthopedics;  Laterality: Left;  ? TEE WITHOUT CARDIOVERSION N/A 05/18/2021  ? Procedure: TRANSESOPHAGEAL ECHOCARDIOGRAM (TEE);  Surgeon: Jolaine Artist, MD;  Location: [facility];  Service: Cardiovascular;  Laterality: N/A;  ? TEE WITHOUT CARDIOVERSION N/A 06/02/2021  ? Procedure: TRANSESOPHAGEAL ECHOCARDIOGRAM (TEE);  Surgeon: Early Osmond, MD;  Location: [facility];  Service: Cardiovascular;  Laterality: N/A;  ? ?Social History:  reports that he has never smoked. He has never used smokeless tobacco. He reports that he does not drink alcohol and does not use drugs. ? ?Family / Support Systems ?Marital Status: Widow/Widower ?How Long?: 6 years ?Patient Roles: Parent ?Children: Joanna-daughter [place] [id number] ?Other Supports: Brenda-sister [phone] ?Anticipated Caregiver: Di Kindle ?Ability/Limitations of Caregiver: Wokrs from home and can assist-intermittently ?Caregiver Availability: 24/7 ?Family Dynamics: Close with children-wants to be able to move on his own. Daughter does work from home and can make sure pt is safe when there. Pt has a sister who is supportive ? ?Social History ?Preferred language: English ?Religion: Holiness ?Cultural Background: No issues ?Education: Trade school-mechanic ?Health Literacy - How often do you need to have someone help you when you read instructions, pamphlets, or other written material from your doctor or pharmacy?: Sometimes ?Writes: Yes ?Employment Status: Retired ?Legal History/Current Legal Issues: No issues ?Guardian/Conservator: None-according to MD pt is capable of making his own decisions while here. Will include daughter since she lives with him and  will be assisting  ? ?Abuse/Neglect ?Abuse/Neglect Assessment Can Be Completed: Yes ?Physical Abuse: Denies ?Verbal Abuse: Denies ?Sexual Abuse:  Denies ?Exploitation of patient/patient's resources: Denies ?Self-Neglect: Denies ? ?Patient response to: ?Social Isolation - How often do you feel lonely or isolated from those around you?: Rarely ? ?Emotional Status ?Pt's affect, behavior and adjustment status: Pt is wanting to get stronger and regain his independence. He has been in and out of the hospital multiple times since 02/2021 and is ready to be at home. He is motivated to do well here. ?Recent Psychosocial Issues: in and out of the hospital for health issues-UTI, AMS etc ?Psychiatric History: No history or issues. He wants to get stronger and go back home to stay he hopes. It has been a rough year for him and he is ready for a break ?Substance Abuse History: No issues ? ?Patient / Family Perceptions, Expectations & Goals ?Pt/Family understanding of illness & functional limitations: Pt and daughter can explain his PE and weakness issues. Both are hopeful he will do well here and be able to move with a rolling Doke as he did PTA. Both have spoken with the MD and feel they have a good understanding of his plan moving forward. ?Premorbid pt/family roles/activities: father, grandfather, retiree, home owner, church member, etc ?Anticipated changes in roles/activities/participation: resume ?Pt/family expectations/goals: Pt states: " I hope to be able to move around with my [place], when I leave."  Daughter states: " I hope he does well and can get stronger while he is there." ? ?Community Resources ?Community Agencies: None ?Premorbid Home Care/DME Agencies: Other (Comment) (has rollator, rw, bsc tub seat) ?Transportation available at discharge: daughter ?Is the patient able to respond to transportation needs?: Yes ?In the past 12 months, has lack of transportation kept you from medical appointments or from getting medications?: No ?In the past 12 months, has lack of transportation kept you from meetings, work, or from getting things needed for daily living?:  No ? ?Discharge Planning ?Living Arrangements: Children, Other relatives ?Support Systems: Children, Other relatives, Friends/neighbors, Church/faith community ?Type of Residence: Private residence ?Insurance Resources: [employer], Multimedia programmer (specify) Printmaker) ?Financial Resources: Fish farm manager, Family Support ?Financial Screen Referred: No ?Living Expenses: Own ?Money Management: Patient, Family ?Does the patient have any problems obtaining your medications?: No ?Home Management: daughter ?Patient/Family Preliminary Plans: Return home with daughter and son n-law who live with him. Daughter does work from home and can assist if needed. will await team's evaluations and work on discharge needs. ?Care Coordinator Anticipated Follow Up Needs: HH/OP ? ?Clinical Impression ?Pleasant gentleman who reports he is not HOH but it seems he is. He takes a minute to answer your questions. Daughter is involved and will assist him if needed. Both want him to get stronger and able to ambulate with his [place] like he was doing prior to admission. Will work on discharge needs. ? ?Elease Hashimoto ?06/09/2021, 1:30 PM ? ?  ?

## 2021-06-09 NOTE — Discharge Instructions (Addendum)
Inpatient Rehab Discharge Instructions ? ?Matthew [name] ?Discharge date and time:  06/22/2021 ? ?Activities/Precautions/ Functional Status: ?Activity: no lifting, driving, or strenuous exercise for until cleared by MD ?Diet: cardiac diet ?Wound Care: none needed ?Functional status:  ?___ No restrictions     ___ Walk up steps independently ?___ 24/7 supervision/assistance   ___ Walk up steps with assistance ?_x__ Intermittent supervision/assistance  ___ Bathe/dress independently ?___ Walk with [place]     ___ Bathe/dress with assistance ?___ Walk Independently    ___ Shower independently ?___ Walk with assistance    _x__ Shower with assistance ?__x_ No alcohol     ___ Return to work/school ________ ? ?Special Instructions: ? ?No driving, alcohol consumption or tobacco use.  ? ? ?COMMUNITY REFERRALS UPON DISCHARGE:   ? ?Home Health:   PT  OT  SP  RN  ?               [facility] [id number]  ? ? ?Medical Equipment/Items Ordered:3 IN 1 AND WHEELCHAIR ?                                                Agency/Supplier:[facility]  [phone] ? ?PRIVATE DUTY LIST PLACED IN DISCHARGE PACKET ?My questions have been answered and I understand these instructions. I will adhere to these goals and the provided educational materials after my discharge from the hospital. ? ?Patient/Caregiver Signature _______________________________ Date __________ ? ?Clinician Signature _______________________________________ Date __________ ? ?Please bring this form and your medication list with you to all your follow-up doctor's appointments.   ? ?Information on my medicine - ELIQUIS? (apixaban) ? ?This medication education was reviewed with me or my healthcare representative as part of my discharge preparation.  ? ?Why was Eliquis? prescribed for you? ?Eliquis? was prescribed to treat blood clots that may have been found in the veins of your legs (deep vein thrombosis) or in your lungs (pulmonary embolism) and to  reduce the risk of them occurring again. ? ?What do You need to know about Eliquis? ? ?The dose is ONE 5 mg tablet taken TWICE daily.  Eliquis? may be taken with or without food.  ? ?Try to take the dose about the same time in the morning and in the evening. If you have difficulty swallowing the tablet whole please discuss with your pharmacist how to take the medication safely. ? ?Take Eliquis? exactly as prescribed and DO NOT stop taking Eliquis? without talking to the doctor who prescribed the medication.  Stopping may increase your risk of developing a new blood clot.  Refill your prescription before you run out. ? ?After discharge, you should have regular check-up appointments with your healthcare provider that is prescribing your Eliquis?. ?   ?What do you do if you miss a dose? ?If a dose of ELIQUIS? is not taken at the scheduled time, take it as soon as possible on the same day and twice-daily administration should be resumed. The dose should not be doubled to make up for a missed dose. ? ?Important Safety Information ?A possible side effect of Eliquis? is bleeding. You should call your healthcare provider right away if you experience any of the following: ?Bleeding from an injury or your nose that does not stop. ?Unusual colored urine (red or dark brown) or unusual colored stools (red or black). ?Unusual bruising for unknown  reasons. ?A serious fall or if you hit your head (even if there is no bleeding). ? ?Some medicines may interact with Eliquis? and might increase your risk of bleeding or clotting while on Eliquis?Marland Kitchen To help avoid this, consult your healthcare provider or pharmacist prior to using any new prescription or non-prescription medications, including herbals, vitamins, non-steroidal anti-inflammatory drugs (NSAIDs) and supplements. ? ?This website has more information on Eliquis? (apixaban): http://www.eliquis.com/eliquis/home ? ?

## 2021-06-09 NOTE — Progress Notes (Signed)
Inpatient Rehabilitation  Patient information reviewed and entered into eRehab system by Phoenix Dresser Tiaira Arambula, OTR/L.   Information including medical coding, functional ability and quality indicators will be reviewed and updated through discharge.    

## 2021-06-09 NOTE — Discharge Summary (Signed)
Physician Discharge Summary  ?Patient ID: ?Matthew [name] ?MRN: [id number] ?DOB/AGE: May 21, 1940 81 y.o. ? ?Admit date: 06/08/2021 ?Discharge date: 06/22/2021 ? ?Discharge Diagnoses:  ?Principal Problem: ?  Heart failure ([place]) ?Active Problems: ?  AKI (acute kidney injury) ([place]) ?  Debility ?  Open wound of right lower extremity ?Biventricular heart failure ?Valvular heart disease ?Right lower extremity ulcer ?Nonischemic cardiomyopathy ?Acute PE ?AKI ?CKD 3b ?Dyslipidemia ?Ascending aortic aneurysm ?CAD ?NSVT ?Hyponatremia ?Hyperkalemia ?Cognitive impairment ?G-E reflux ? ?Discharged Condition: stable ? ?Significant Diagnostic Studies: ?DG Chest 2 View ? ?Result Date: 06/01/2021 ?CLINICAL DATA:  Preoperative. EXAM: CHEST - 2 VIEW COMPARISON:  Chest x-ray 05/25/2021. FINDINGS: Right-sided central venous catheter tip projects over the SVC. The cardiomediastinal silhouette is stable, the heart is enlarged. There is no focal lung consolidation, pleural effusion or pneumothorax. There are minimal atelectatic changes in the lung bases. No acute fractures are seen. IMPRESSION: 1. Stable cardiomegaly. 2. No acute cardiopulmonary process. Electronically Signed   By: Ronney Asters M.D.   On: 06/01/2021 23:01  ? ?CARDIAC CATHETERIZATION ? ?Result Date: 06/02/2021 ?Transcatheter edge-to-edge repair of the mitral valve with placement of 2 Mitraclip XTW devices, the first device is A2/P2 and the second device is medial A2/P2. MR reduction from 4+ at baseline to 3+ post-procedure. MR reduction is excellent after the second Clip grasp is made, but MR worsens after the Clip is released. The major portion of the the residual MR jet is between the 2 Clips.  ? ?DG CHEST PORT 1 VIEW ? ?Result Date: 06/03/2021 ?CLINICAL DATA:  Acute respiratory failure. Heart failure. Hypertension. Thoracic aortic aneurysm EXAM: PORTABLE CHEST 1 VIEW COMPARISON:  Yesterday FINDINGS: Endotracheal tube terminates 6.2 cm above carina. Nasogastric tube extends  beyond the inferior aspect of the film. Right internal jugular Cordis sheath unchanged. Right-sided PICC line tip at low SVC. Midline trachea. Mild cardiomegaly. Atherosclerosis in the transverse aorta. No pleural effusion or pneumothorax. No congestive failure. Improved left base aeration with mild atelectasis remaining. There is lucency about the inferolateral left chest wall. IMPRESSION: Significantly improved aeration with nearly completely resolved left base airspace disease/atelectasis. Cardiomegaly without congestive failure. Lucency about the inferolateral left chest wall. This may be artifactual. If there is a clinical concern of subcutaneous emphysema and/or inferolateral left pneumothorax, consider repeat radiographs versus further evaluation with CT. Aortic Atherosclerosis (ICD10-I70.0). Electronically Signed   By: Abigail Miyamoto M.D.   On: 06/03/2021 08:00  ? ?DG CHEST PORT 1 VIEW ? ?Result Date: 06/02/2021 ?CLINICAL DATA:  Short of breath, central venous catheter placement, pulmonary embolus EXAM: PORTABLE CHEST 1 VIEW COMPARISON:  06/01/2021 FINDINGS: Single frontal view of the chest demonstrates endotracheal tube overlying tracheal air column tip just below thoracic inlet. Right internal jugular catheter tip projects over the superior vena cava. Stable right-sided PICC. Two radiodensities are seen projecting over the inferior heart border, likely surgical clips and new since prior exam. Cardiac silhouette is enlarged. Increased central vascular congestion. Increased density at the left lung base consistent with consolidation and likely small effusion. No pneumothorax. IMPRESSION: 1. Support devices as above. 2. Enlarged cardiac silhouette. 3. Central vascular congestion, with likely left basilar atelectasis and small effusion. Electronically Signed   By: Randa Ngo M.D.   On: 06/02/2021 17:02  ? ?DG CHEST PORT 1 VIEW ? ?Result Date: 05/25/2021 ?CLINICAL DATA:  Shortness of breath. EXAM: PORTABLE  CHEST 1 VIEW COMPARISON:  Chest x-ray dated May 13, 2021. FINDINGS: New right upper extremity PICC line with tip in the  proximal right atrium. Stable cardiomegaly. Normal pulmonary vascularity. No consolidation, pneumothorax, or large pleural effusion. No acute osseous abnormality. IMPRESSION: 1. No active disease. Electronically Signed   By: Titus Dubin M.D.   On: 05/25/2021 13:03  ? ?DG Abd Portable 1V ? ?Result Date: 06/02/2021 ?CLINICAL DATA:  Orogastric tube placement. EXAM: PORTABLE ABDOMEN - 1 VIEW COMPARISON:  None. FINDINGS: The tip of the orogastric tube is in the proximal body of the stomach with side hole at the level of the gastroesophageal junction. There is gaseous distention of bowel in the upper abdomen. IMPRESSION: 1. Orogastric tube tip in the proximal body of the stomach with side hole at the level of the gastroesophageal junction. Recommend advancing tube. Electronically Signed   By: Ronney Asters M.D.   On: 06/02/2021 20:47  ? ?ECHOCARDIOGRAM COMPLETE ? ?Result Date: 06/06/2021 ?   ECHOCARDIOGRAM REPORT   Patient Name:   Matthew [name] Date of Exam: 06/03/2021 Medical Rec #:  [id number]        Height:       79.0 in Accession #:    [id number]       Weight:       187.6 lb Date of Birth:  [birth year]        BSA:          2.218 m? Patient Age:    14 years         BP:           110/91 mmHg Patient Gender: M                HR:           48 bpm. Exam Location:  Inpatient Procedure: 2D Echo, Cardiac Doppler and Color Doppler Indications:    S/P mitral valve clip implantation  History:        Patient has prior history of Echocardiogram examinations, most                 recent 06/02/2021. CHF; Risk Factors:Dyslipidemia and                 Hypertension. Acute pulmonary embolus. Ascending aortic                 aneurysm. S/P mitral valve clip implantation.  Sonographer:    Clayton Lefort RDCS (AE) Referring Phys: [place]  1. Left ventricular ejection fraction, by estimation, is 20  to 25%. The left ventricle has severely decreased function. The left ventricle demonstrates global hypokinesis. The left ventricular internal cavity size was severely dilated. There is mild left ventricular hypertrophy. Left ventricular diastolic parameters are indeterminate.  2. Right ventricular systolic function is severely reduced. The right ventricular size is severely enlarged. There is moderately elevated pulmonary artery systolic pressure.  3. Left atrial size was moderately dilated.  4. Right atrial size was moderately dilated.  5. Post mitral clip XTW x 2 A2/P2 area. Residual 3/4 moderate to sever MR with at least one jet coming between the two clips. No evidence of SLD. Mean diastolic gradient 3 peak 7 mmHg at HR of 80 bpm. The mitral valve has been repaired/replaced. Moderate to severe mitral valve regurgitation.  6. Tricuspid valve regurgitation is severe.  7. The aortic valve is tricuspid. There is mild calcification of the aortic valve. There is mild thickening of the aortic valve. Aortic valve regurgitation is mild. Aortic valve sclerosis is present, with no evidence of aortic valve stenosis.  8. Aortic dilatation  noted. There is mild dilatation of the aortic root, measuring 40 mm. There is mild dilatation of the ascending aorta, measuring 39 mm.  9. Residual left to right shunt noted at trans septal puncture site. FINDINGS  Left Ventricle: Left ventricular ejection fraction, by estimation, is 20 to 25%. The left ventricle has severely decreased function. The left ventricle demonstrates global hypokinesis. The left ventricular internal cavity size was severely dilated. There is mild left ventricular hypertrophy. Left ventricular diastolic parameters are indeterminate. Right Ventricle: The right ventricular size is severely enlarged. Right vetricular wall thickness was not assessed. Right ventricular systolic function is severely reduced. There is moderately elevated pulmonary artery systolic  pressure. The tricuspid regurgitant velocity is 3.30 m/s, and with an assumed right atrial pressure of 15 mmHg, the estimated right ventricular systolic pressure is 0000000 mmHg. Left Atrium: Left atrial size was moder

## 2021-06-09 NOTE — Progress Notes (Signed)
?                                                       PROGRESS NOTE ? ? ?Subjective/Complaints: ? ?Pt reports are cereal, but not pancakes because has a poor appetite.  ?Not real hungry.  ?LBM yesterday ?Peeing using condom catheter.  ?Denies pain.  ?Says isn't HOH.  ? ? ?ROS: ? ?Pt denies SOB, abd pain, CP, N/V/C/D, and vision changes ? ?Objective: ?  ?No results found. ?Recent Labs  ?  06/07/21 ?0330 06/08/21 ?0405  ?WBC 7.0 7.6  ?HGB 12.1* 12.4*  ?HCT 37.6* 39.5  ?PLT 152 161  ? ?Recent Labs  ?  06/08/21 ?0405 06/09/21 ?0600  ?NA 134* 135  ?K 4.8 4.6  ?CL 96* 97*  ?CO2 30 31  ?GLUCOSE 87 88  ?BUN 54* 54*  ?CREATININE 2.10* 2.11*  ?CALCIUM 9.2 9.4  ? ? ?Intake/Output Summary (Last 24 hours) at 06/09/2021 1048 ?Last data filed at 06/09/2021 0830 ?Gross per 24 hour  ?Intake 120 ml  ?Output 650 ml  ?Net -530 ml  ?  ? ?Pressure Injury 06/03/21 Sacrum Left Stage 2 -  Partial thickness loss of dermis presenting as a shallow open injury with a red, pink wound bed without slough. (Active)  ?06/03/21 1800  ?Location: Sacrum  ?Location Orientation: Left  ?Staging: Stage 2 -  Partial thickness loss of dermis presenting as a shallow open injury with a red, pink wound bed without slough.  ?Wound Description (Comments):   ?Present on Admission:   ?   ?Pressure Injury 06/08/21 Sacrum Right Stage 2 -  Partial thickness loss of dermis presenting as a shallow open injury with a red, pink wound bed without slough. (Active)  ?06/08/21 1755  ?Location: Sacrum  ?Location Orientation: Right  ?Staging: Stage 2 -  Partial thickness loss of dermis presenting as a shallow open injury with a red, pink wound bed without slough.  ?Wound Description (Comments):   ?Present on Admission: Yes  ? ? ?Physical Exam: ?Vital Signs ?Blood pressure 102/67, pulse 66, temperature 97.7 ?F (36.5 ?C), temperature source Oral, resp. rate 19, height 6\' 7"  (2.007 m), weight 82.1 kg, SpO2 100 %. ? ? ?General: awake, alert, appropriate, NAD ?HENT: conjugate gaze;  oropharynx moist ?CV: regular rate; no JVD ?Pulmonary: CTA B/L; decreased at bases B/L ?GI: soft, NT, ND, (+)BS- slightly hypoactive ?Psychiatric: appropriate ?Neurological: alert, says isn't HOH, but has very delayed responses ?Skin: ?   Comments: Superficial skin ulceration right anterior lower leg  ?RUE PICC- also has stage II on sacrum ?Neurological:  ?   General: No focal deficit present. Moving all 4 extremities ?    ?  ?  ?  ? ?Assessment/Plan: ?1. Functional deficits which require 3+ hours per day of interdisciplinary therapy in a comprehensive inpatient rehab setting. ?Physiatrist is providing close team supervision and 24 hour management of active medical problems listed below. ?Physiatrist and rehab team continue to assess barriers to discharge/monitor patient progress toward functional and medical goals ? ?Care Tool: ? ?Bathing ?   ?   ?   ?  ?  ?Bathing assist   ?  ?  ?Upper Body Dressing/Undressing ?Upper body dressing   ?  ?   ?Upper body assist   ?   ?Lower Body Dressing/Undressing ?Lower body dressing ? ? ?   ?  ? ?  ? ?  Lower body assist   ?   ? ?Toileting ?Toileting    ?Toileting assist   ?  ?  ?Transfers ?Chair/bed transfer ? ?Transfers assist ?   ? ?  ?  ?  ?Locomotion ?Ambulation ? ? ?Ambulation assist ? ?   ? ?  ?  ?   ? ?Walk 10 feet activity ? ? ?Assist ?   ? ?  ?   ? ?Walk 50 feet activity ? ? ?Assist   ? ?  ?   ? ? ?Walk 150 feet activity ? ? ?Assist   ? ?  ?  ?  ? ?Walk 10 feet on uneven surface  ?activity ? ? ?Assist   ? ? ?  ?   ? ?Wheelchair ? ? ? ? ?Assist   ?  ?  ? ?  ?   ? ? ?Wheelchair 50 feet with 2 turns activity ? ? ? ?Assist ? ?  ?  ? ? ?   ? ?Wheelchair 150 feet activity  ? ? ? ?Assist ?   ? ? ?   ? ?Blood pressure 102/67, pulse 66, temperature 97.7 ?F (36.5 ?C), temperature source Oral, resp. rate 19, height 6\' 7"  (2.007 m), weight 82.1 kg, SpO2 100 %. ? ?Medical Problem List and Plan: ?1. Functional deficits secondary to heart failure/debility ?            -patient may  shower ?            -ELOS/Goals: S 10-14 days ?            -first day of evaluations- con't CIR- PT and OT ?2.  Antithrombotics: ?-DVT/anticoagulation:  Pharmaceutical: Other (comment)Eliquis 5 mg BID ?            -antiplatelet therapy: none ?3. Pain Management: Tylenol as needed ? 4/27- denies pain- con't regimen ?4. Mood: LCSW to evaluate and provide emotional support ?            -antipsychotic agents: n/a ?5. Neuropsych: This patient is capable of making decisions on his own behalf. ?6. Skin/Wound Care: Routine skin care checks ?            -- continue local wound care to RLE with [facility] ?7. Fluids/Electrolytes/Nutrition: Strict Is and Os and follow-up chemistries + mag in AM ?            --per HF team>>K+ greater than 4 and mag greater than 2 ? 4/27- K+ 4.6- and Mg 2.1- con't regimen ?8: Nonischemic cardiomyopathy ?9: Acute on chronic biventricular heart failure (off Toprol Xl due to soft BP) ?            --daily weight ?            --continue torsemide 40 mg daily ?-- continue midodrine 15 mg 3 times daily ?-- continue amiodarone 400 mg twice daily ?--mexilitine 300 mg BID ?-- Mag-Ox 400 mg daily ?-daily weights ?4/27- down 1 kg, but likely changed beds- will monitor for trend10: Valvular heart disease: s/p TEER MVR, still with 3+ MR ?--tricuspid regurg>>moderate on TEE ?11: Acute pulmonary embolism: ?--continue Eliquis 5 mg twice daily ?12: Acute kidney injury atop chronic kidney disease stage IIIb; serum Cr is baseline 1.4-1.7 ?            --current Cr 2.1>>follow-up BMP ?--off spironolactone ?4/27- Cr stable today at 2.11 and BUN 54- is worse than baseline- however don't want to give IVFs right now due to CHF exacerbation just got  over- will encourage him to drink and monitor ?13: Dyslipidemia: ?-- continue Lipitor 10 mg daily ?14: Ascending aortic aneurysm ?-- follow-up CT surgery as outpatient ?15: CAD: Left heart cath with mild nonobstructive CAD, continue statin ?16: NSVT/PVCs: Goal to keep potassium  greater than 4 and magnesium greater than 2 ?--heart meds as above #9 ?--not ablation candidate ?17: Right lower extremity wound: Local wound care ?            -- Bilateral ABIs noncompressible ?18: Hyponatremia: serum sodium 134; limit free water>>follow-up BMP ?4/27- Na 135- con't to monitor ?19: Hyperkalemia currently resolved>>follow-up BMP ?            --keep K+ greater than 4 ?20. Using condom catheter- con't at night if need be.  ? ? ?LOS: ?1 days ?A FACE TO FACE EVALUATION WAS PERFORMED ? ?Matthew [name] ?06/09/2021, 10:48 AM  ? ? ? ?

## 2021-06-09 NOTE — Progress Notes (Signed)
[facility] ?Individual Statement of Services ? ?Patient Name:  Matthew [name]  ?Date:  06/09/2021 ? ?Welcome to the [facility]patient [facility].  Our goal is to provide you with an individualized program based on your diagnosis and situation, designed to meet your specific needs.  With this comprehensive rehabilitation program, you will be expected to participate in at least 3 hours of rehabilitation therapies Monday-Friday, with modified therapy programming on the weekends. ? ?Your rehabilitation program will include the following services:  Physical Therapy (PT), Occupational Therapy (OT), Speech Therapy (ST), 24 hour per day rehabilitation nursing, Therapeutic Recreaction (TR), Care Coordinator, Rehabilitation Medicine, Nutrition Services, and Pharmacy Services ? ?Weekly team conferences will be held on Tuesday to discuss your progress.  Your Inpatient Rehabilitation Care Coordinator will talk with you frequently to get your input and to update you on team discussions.  Team conferences with you and your family in attendance may also be held. ? ?Expected length of stay: 12-14 days  Overall anticipated outcome: CGA-min level ? ?Depending on your progress and recovery, your program may change. Your Inpatient Rehabilitation Care Coordinator will coordinate services and will keep you informed of any changes. Your Inpatient Rehabilitation Care Coordinator's name and contact numbers are listed  below. ? ?The following services may also be recommended but are not provided by the Inpatient Rehabilitation Center:  ? ?Home Health Rehabiltiation Services ?Outpatient Rehabilitation Services ? ?  ?Arrangements will be made to provide these services after discharge if needed.  Arrangements include referral to agencies that provide these services. ? ?Your insurance has been verified to be:  Medicare & State BCBS ?Your primary doctor is:   ? ?Pertinent information will be shared with your doctor and  your insurance company. ? ?Inpatient Rehabilitation Care Coordinator:  Dossie Der, LCSW [phone] or (C) [phone] ? ?Information discussed with and copy given to patient by: Lucy Chris, 06/09/2021, 1:32 PM    ?

## 2021-06-09 NOTE — Plan of Care (Signed)
?  Problem: RH Balance ?Goal: LTG Patient will maintain dynamic sitting balance (PT) ?Description: LTG:  Patient will maintain dynamic sitting balance with assistance during mobility activities (PT) ?Flowsheets (Taken 06/09/2021 1520) ?LTG: Pt will maintain dynamic sitting balance during mobility activities with:: Supervision/Verbal cueing ?Goal: LTG Patient will maintain dynamic standing balance (PT) ?Description: LTG:  Patient will maintain dynamic standing balance with assistance during mobility activities (PT) ?Flowsheets (Taken 06/09/2021 1520) ?LTG: Pt will maintain dynamic standing balance during mobility activities with:: Contact Guard/Touching assist ?  ?Problem: Sit to Stand ?Goal: LTG:  Patient will perform sit to stand with assistance level (PT) ?Description: LTG:  Patient will perform sit to stand with assistance level (PT) ?Flowsheets (Taken 06/09/2021 1520) ?LTG: PT will perform sit to stand in preparation for functional mobility with assistance level: Contact Guard/Touching assist ?  ?Problem: RH Bed Mobility ?Goal: LTG Patient will perform bed mobility with assist (PT) ?Description: LTG: Patient will perform bed mobility with assistance, with/without cues (PT). ?Flowsheets (Taken 06/09/2021 1520) ?LTG: Pt will perform bed mobility with assistance level of: Supervision/Verbal cueing ?  ?Problem: RH Bed to Chair Transfers ?Goal: LTG Patient will perform bed/chair transfers w/assist (PT) ?Description: LTG: Patient will perform bed to chair transfers with assistance (PT). ?Flowsheets (Taken 06/09/2021 1520) ?LTG: Pt will perform Bed to Chair Transfers with assistance level: Contact Guard/Touching assist ?  ?Problem: RH Car Transfers ?Goal: LTG Patient will perform car transfers with assist (PT) ?Description: LTG: Patient will perform car transfers with assistance (PT). ?Flowsheets (Taken 06/09/2021 1520) ?LTG: Pt will perform car transfers with assist:: Contact Guard/Touching assist ?  ?Problem: RH  Ambulation ?Goal: LTG Patient will ambulate in controlled environment (PT) ?Description: LTG: Patient will ambulate in a controlled environment, # of feet with assistance (PT). ?Flowsheets (Taken 06/09/2021 1520) ?LTG: Pt will ambulate in controlled environ  assist needed:: Contact Guard/Touching assist ?LTG: Ambulation distance in controlled environment: 50 ?Goal: LTG Patient will ambulate in home environment (PT) ?Description: LTG: Patient will ambulate in home environment, # of feet with assistance (PT). ?Flowsheets (Taken 06/09/2021 1520) ?LTG: Pt will ambulate in home environ  assist needed:: Contact Guard/Touching assist ?LTG: Ambulation distance in home environment: 25 ?  ?Problem: RH Wheelchair Mobility ?Goal: LTG Patient will propel w/c in controlled environment (PT) ?Description: LTG: Patient will propel wheelchair in controlled environment, # of feet with assist (PT) ?Flowsheets (Taken 06/09/2021 1520) ?LTG: Pt will propel w/c in controlled environ  assist needed:: Supervision/Verbal cueing ?LTG: Propel w/c distance in controlled environment: 150 ?Goal: LTG Patient will propel w/c in home environment (PT) ?Description: LTG: Patient will propel wheelchair in home environment, # of feet with assistance (PT). ?Flowsheets (Taken 06/09/2021 1520) ?LTG: Pt will propel w/c in home environ  assist needed:: Supervision/Verbal cueing ?LTG: Propel w/c distance in home environment: 50 ?  ?

## 2021-06-09 NOTE — Evaluation (Signed)
-Speech Language Pathology Assessment and Plan ? ?Patient Details  ?Name: Matthew [name] ?MRN: [id number] ?Date of Birth: Apr 11, 1940 ? ?SLP Diagnosis: Cognitive Impairments  ?Rehab Potential: Fair (unclear of cognitive baseline) ?ELOS: 12-14 days  ? ?Today's Date: 06/09/2021 ?SLP Individual Time: 1310-1400 ?SLP Individual Time Calculation (min): 50 min and Today's Date: 06/09/2021 ?SLP Missed Time: 10 Minutes ?Missed Time Reason: Nursing care ? ?Hospital Problem: Principal Problem: ?  Heart failure ([place]) ?Active Problems: ?  Debility ? ?Past Medical History:  ?Past Medical History:  ?Diagnosis Date  ? CAD (coronary artery disease)   ? Chronic HFrEF (heart failure with reduced ejection fraction) ([place])   ? Chronic kidney disease, stage 3b ([place])   ? Encephalopathy 04/2021  ? admitted for metabolic encephalopathy  ? HTN (hypertension)   ? Hyperlipidemia   ? NICM (nonischemic cardiomyopathy) ([place])   ? Obesity   ? Osteoarthritis   ? S/P mitral valve clip implantation 06/02/2021  ? MitraClip XTW x2 with Dr. Ali Lowe and Dr. Burt Knack  ? Severe mitral regurgitation   ? Thoracic aortic aneurysm (TAA) ([place])   ? Thrombocytopenia ([place])   ? noted in labs in 2023  ? Tricuspid regurgitation   ? ?Past Surgical History:  ?Past Surgical History:  ?Procedure Laterality Date  ? CHONDROPLASTY Left 01/11/2017  ? Procedure: CHONDROPLASTY of medial and patella compartment;  Surgeon: Dorna Leitz, MD;  Location: [place];  Service: Orthopedics;  Laterality: Left;  ? KNEE ARTHROSCOPY Left 01/11/2017  ? Procedure: ARTHROSCOPIC IRRIGATION AND DEBRIDMENT KNEE;  Surgeon: Dorna Leitz, MD;  Location: [place];  Service: Orthopedics;  Laterality: Left;  ? KNEE SURGERY    ? MENISECTOMY Left 01/11/2017  ? Procedure: Lateral MENISECTOMY;  Surgeon: Dorna Leitz, MD;  Location: [place];  Service: Orthopedics;  Laterality: Left;  ? MITRAL VALVE REPAIR N/A 06/02/2021  ? Procedure: MITRAL VALVE REPAIR;  Surgeon: Early Osmond, MD;  Location: [place] CV [facility];   Service: Cardiovascular;  Laterality: N/A;  ? RIGHT/LEFT HEART CATH AND CORONARY ANGIOGRAPHY N/A 05/18/2021  ? Procedure: RIGHT/LEFT HEART CATH AND CORONARY ANGIOGRAPHY;  Surgeon: Jolaine Artist, MD;  Location: [place] [facility];  Service: Cardiovascular;  Laterality: N/A;  ? SYNOVECTOMY Left 01/11/2017  ? Procedure: Four compartment SYNOVECTOMY;  Surgeon: Dorna Leitz, MD;  Location: [place];  Service: Orthopedics;  Laterality: Left;  ? TEE WITHOUT CARDIOVERSION N/A 05/18/2021  ? Procedure: TRANSESOPHAGEAL ECHOCARDIOGRAM (TEE);  Surgeon: Jolaine Artist, MD;  Location: [facility] ENDOSCOPY;  Service: Cardiovascular;  Laterality: N/A;  ? TEE WITHOUT CARDIOVERSION N/A 06/02/2021  ? Procedure: TRANSESOPHAGEAL ECHOCARDIOGRAM (TEE);  Surgeon: Early Osmond, MD;  Location: [place] [facility];  Service: Cardiovascular;  Laterality: N/A;  ? ? ?Assessment / Plan / Recommendation ?Clinical Impression Matthew [name] is an 81 year old male with a history of chronic biventricular heart failure who presented to the emergency department on 05/13/2021 for significant shortness of breath x1 hour while watching TV.  He had been evaluated that morning in the heart failure clinic and plans were in place for right and left heart catheterization and TEE in the next few weeks per Dr. Haroldine Laws. He had recently been admitted with acute kidney injury on 3/26 and discharged on 3/28.  CTA of the chest was performed concerning for emboli. Right and left heart catheterization on 4/5 with findings of severe nonischemic cardiomyopathy. He underwent transcatheter edge to edge MVR by Dr. Lenna Sciara on 4/20. He developed post-procedure hypoxia and was reintubated in the holding area. He was  extubated the following day. He was transferred to heart failure service. Not a candidate for PVC ablation. Progressed with OT and PT. Tolerating diet. Lives locally with his daughter since his wife's death approximately 6 years ago.  He has a son who lives  nearby.  He is retired from [employer] as a Therapist, occupational.  Was using rolling [place] for assistance prior to admission. Scraped anterior right lower leg prior to admission.The patient requires inpatient medicine and rehabilitation evaluations and services for ongoing dysfunction secondary to multiple medical problems including heart failure. ? ?Patient demonstrates cognitive impairments characterized by decreased sustained attention, functional problem solving, recall of functional information and emergent awareness which impacts his safety with functional and familiar tasks. SLP initiated the [facility] Mental Status Examination (SLUMS), however unable to compete due to time constraints. Patient's overall verbal expression appeared [facility] for all tasks assessed. From a comprehensions standpoint, pt benefited from extended processing time and verbal repetition to aid comprehension. This may also attribute to difficulty hearing. Patient's vocal quality was perceived as hoarse with reduced vocal intensity which may be consistent with recent intubation. ENT consult may be appropriate if hoarseness persists. Educated patient/son on Insurance risk surveyor including hydration, reducing vocal strain, reducing background noise, and increasing breath support for speech through diaphragmatic breath. Pt presented with mild-to-moderately reduced speech intelligibility however son reports this is consistent with baseline. Question pre-morbid speech deficits/differences? Oral mechanism exam was unremarkable. Son also denied acute changes to cognition and stated he exhibited "confusion spells" approximately 2 months ago which lasted temporarily and would resolve when patient was in familiar environment. Pt/son report pt received assistance from daughter who he lives with, with cooking, cleaning, medication management, and finances. Apparently pt was driving at prior level. Patient would benefit from  shot-term skilled SLP intervention to maximize cognitive functioning and overall functional independence and safety prior to discharge.  ?   ?Skilled Therapeutic Interventions          Pt participated in partial Bostic Status Examination (SLUMS) as well as further non-standardized assessments of cognitive-linguistic, speech, and language function. Please see above.    ?  ?SLP Assessment ? Patient will need skilled Speech Lanaguage Pathology Services during CIR admission  ?  ?Recommendations ? Recommended Consults: Consider ENT evaluation ?Patient destination: Home ?Follow up Recommendations: Other (comment);24 hour supervision/assistance (TBD) ?Equipment Recommended: None recommended by SLP  ?  ?SLP Frequency 3 to 5 out of 7 days   ?SLP Duration ? ?SLP Intensity ? ?SLP Treatment/Interventions 12-14 days ? ?Minumum of 1-2 x/day, 30 to 90 minutes ? ?Cognitive remediation/compensation;Internal/external aids;Functional tasks;Patient/family education;Therapeutic Activities;Environmental controls   ? ?Pain ?Pain Assessment ?Pain Scale: 0-10 ?Pain Score: 0-No pain ? ?Prior Functioning ?Cognitive/Linguistic Baseline: Baseline deficits ?Baseline deficit details: Son endorsed intermittent confusion at home for short duration of time; some forgetfulness. ?Type of Home: House ? Lives With: Family;Daughter ?Available Help at Discharge: Family;Available 24 hours/day ?Education: 11th grade ?Vocation: Retired ? ?SLP Evaluation ?Cognition ?Overall Cognitive Status: Impaired/Different from baseline ?Arousal/Alertness: Awake/alert ?Orientation Level: Oriented X4 ?Year: 2023 ?Month: April ?Day of Week: Correct ?Attention: Sustained ?Sustained Attention: Impaired ?Sustained Attention Impairment: Verbal basic ?Memory: Impaired ?Memory Impairment: Storage deficit;Retrieval deficit;Decreased recall of new information ?Awareness: Impaired ?Awareness Impairment: Emergent impairment ?Problem Solving: Impaired ?Problem  Solving Impairment: Verbal basic;Functional basic ?Safety/Judgment: Impaired  ?Comprehension ?Auditory Comprehension ?Overall Auditory Comprehension: Impaired ?Conversation: Simple ?Interfering Components: Hear

## 2021-06-09 NOTE — Plan of Care (Signed)
?  Problem: Consults ?Goal: RH GENERAL PATIENT EDUCATION ?Description: See Patient Education module for education specifics. ?Outcome: Progressing ?  ?Problem: RH BOWEL ELIMINATION ?Goal: RH STG MANAGE BOWEL WITH ASSISTANCE ?Description: STG Manage Bowel with mod I Assistance. ?Outcome: Progressing ?  ?Problem: RH BLADDER ELIMINATION ?Goal: RH STG MANAGE BLADDER WITH ASSISTANCE ?Description: STG Manage Bladder With toileting Assistance ?Outcome: Progressing ?  ?Problem: RH SKIN INTEGRITY ?Goal: RH STG MAINTAIN SKIN INTEGRITY WITH ASSISTANCE ?Description: STG Maintain Skin Integrity With min Assistance. ?Outcome: Progressing ?Goal: RH STG ABLE TO PERFORM INCISION/WOUND CARE W/ASSISTANCE ?Description: STG Able To Perform Incision/Wound Care With min Assistance. ?Outcome: Progressing ?  ?Problem: RH SAFETY ?Goal: RH STG ADHERE TO SAFETY PRECAUTIONS W/ASSISTANCE/DEVICE ?Description: STG Adhere to Safety Precautions With cues Assistance/Device. ?Outcome: Progressing ?  ?Problem: RH KNOWLEDGE DEFICIT GENERAL ?Goal: RH STG INCREASE KNOWLEDGE OF SELF CARE AFTER HOSPITALIZATION ?Description: Patient and family will be able to manage care at discharge using handouts and educational tools independently ?Outcome: Progressing ?  ?

## 2021-06-09 NOTE — Evaluation (Signed)
Occupational Therapy Assessment and Plan ? ?Patient Details  ?Name: Matthew [name] ?MRN: [id number] ?Date of Birth: Jan 25, 1941 ? ?OT Diagnosis: abnormal posture, cognitive deficits, muscular wasting and disuse atrophy, and muscle weakness (generalized) ?Rehab Potential: Rehab Potential (ACUTE ONLY): Fair ?ELOS: 12-14 days  ? ?Today's Date: 06/09/2021 ?OT Individual Time: 0930-1050 ?OT Individual Time Calculation (min): 80 min    ? ?Hospital Problem: Principal Problem: ?  Heart failure ([place]) ?Active Problems: ?  Debility ? ? ?Past Medical History:  ?Past Medical History:  ?Diagnosis Date  ? CAD (coronary artery disease)   ? Chronic HFrEF (heart failure with reduced ejection fraction) ([place])   ? Chronic kidney disease, stage 3b ([place])   ? Encephalopathy 04/2021  ? admitted for metabolic encephalopathy  ? HTN (hypertension)   ? Hyperlipidemia   ? NICM (nonischemic cardiomyopathy) ([place])   ? Obesity   ? Osteoarthritis   ? S/P mitral valve clip implantation 06/02/2021  ? MitraClip XTW x2 with Dr. Ali [name] and Dr. Burt [name]  ? Severe mitral regurgitation   ? Thoracic aortic aneurysm (TAA) ([place])   ? Thrombocytopenia ([place])   ? noted in labs in 2023  ? Tricuspid regurgitation   ? ?Past Surgical History:  ?Past Surgical History:  ?Procedure Laterality Date  ? CHONDROPLASTY Left 01/11/2017  ? Procedure: CHONDROPLASTY of medial and patella compartment;  Surgeon: Matthew Leitz, MD;  Location: [place];  Service: Orthopedics;  Laterality: Left;  ? KNEE ARTHROSCOPY Left 01/11/2017  ? Procedure: ARTHROSCOPIC IRRIGATION AND DEBRIDMENT KNEE;  Surgeon: Matthew Leitz, MD;  Location: [place];  Service: Orthopedics;  Laterality: Left;  ? KNEE SURGERY    ? MENISECTOMY Left 01/11/2017  ? Procedure: Lateral MENISECTOMY;  Surgeon: Matthew Leitz, MD;  Location: [place];  Service: Orthopedics;  Laterality: Left;  ? MITRAL VALVE REPAIR N/A 06/02/2021  ? Procedure: MITRAL VALVE REPAIR;  Surgeon: Matthew Osmond, MD;  Location: [place] [facility];  Service:  Cardiovascular;  Laterality: N/A;  ? RIGHT/LEFT HEART CATH AND CORONARY ANGIOGRAPHY N/A 05/18/2021  ? Procedure: RIGHT/LEFT HEART CATH AND CORONARY ANGIOGRAPHY;  Surgeon: Matthew Artist, MD;  Location: [place] [facility];  Service: Cardiovascular;  Laterality: N/A;  ? SYNOVECTOMY Left 01/11/2017  ? Procedure: Four compartment SYNOVECTOMY;  Surgeon: Matthew Leitz, MD;  Location: [place];  Service: Orthopedics;  Laterality: Left;  ? TEE WITHOUT CARDIOVERSION N/A 05/18/2021  ? Procedure: TRANSESOPHAGEAL ECHOCARDIOGRAM (TEE);  Surgeon: Matthew Artist, MD;  Location: [facility]COPY;  Service: Cardiovascular;  Laterality: N/A;  ? TEE WITHOUT CARDIOVERSION N/A 06/02/2021  ? Procedure: TRANSESOPHAGEAL ECHOCARDIOGRAM (TEE);  Surgeon: Matthew Osmond, MD;  Location: [place] [facility];  Service: Cardiovascular;  Laterality: N/A;  ? ? ?Assessment & Plan ?Clinical Impression:  Matthew [name] is an 81 year old male with a history of chronic biventricular heart failure who presented to the emergency department on 05/13/2021 for significant shortness of breath x1 hour while watching TV.  He had been evaluated that morning in the heart failure clinic and plans were in place for right and left heart catheterization and TEE in the next few weeks per Dr. Haroldine [name].  He was to continue Toprol-XL, Enestro, spironolactone and [place].  He had recently been admitted with acute kidney injury on 3/26 and discharged on 3/28.  CTA of the chest was performed concerning for emboli.  Frequent PVCs monitored. He was started on heparin infusion and admitted to the medicine service for further management.  ?  ?He underwent multiple imaging procedures including bilateral lower  extremity venous duplex without evidence of DVT, echocardiogram with findings of EF approximately 25% and severely decreased left ventricular function, bilateral lower extremity ABIs with noncompressible vessels.  Right and left heart catheterization on 4/5 with findings of  severe nonischemic cardiomyopathy.  The patient is not a candidate for MitraClip currently.  Dr. Haroldine [name] recommended suppressing PVCs more fully as they may be contributing to his LV dysfunction: serum potassium level maintained greater than 4 and magnesium level greater than 2. ?  ?GDMT held secondary to hypotension. Cardiology consulted, Dr. Gardiner [name], on 4/1.  Started on Lasix intravenously.  Serum creatinine monitored in the setting of chronic kidney disease stage IIIb.  Heparin infusion transitioned to Eliquis 5 mg twice daily.  Cardiac MRI done to rule out sarcoidosis.  NSVT and PVCs continued but improved on amiodarone infusion and mexiletine.  Electrolytes monitored. Lactic acidosis resolved with milrinone. Repeat TEE 4/20 with estimated EF 20-25%.  He underwent transcatheter edge to edge MVR by Dr. Lenna [name] on 4/20. He developed post-procedure hypoxia and was reintubated in the holding area. He was extubated the following day. He was transferred to heart failure service. Not a candidate for PVC ablation. Progressed with OT and PT. Tolerating diet. The patient requires inpatient medicine and rehabilitation evaluations and services for ongoing dysfunction secondary to multiple medical problems including heart failure. Patient transferred to CIR on 06/08/2021 .   ? ?Patient currently requires mod with basic self-care skills secondary to muscle weakness and muscle joint tightness, decreased cardiorespiratoy endurance, decreased motor planning, decreased awareness, decreased problem solving, decreased memory, and delayed processing, and decreased sitting balance, decreased standing balance, decreased postural control, and decreased balance strategies.  Prior to hospitalization, patient could complete BADLs with modified independent . ? ?Patient will benefit from skilled intervention to increase independence with basic self-care skills prior to discharge home with care partner.  Anticipate patient will  require minimal physical assistance and follow up home health. ? ?OT - End of Session ?Activity Tolerance: Tolerates 10 - 20 min activity with multiple rests ?Endurance Deficit: Yes ?OT Assessment ?Rehab Potential (ACUTE ONLY): Fair ?OT Patient demonstrates impairments in the following area(s): Balance;Endurance;Motor;Cognition;Perception;Safety;Pain ?OT Basic ADL's Functional Problem(s): Bathing;Dressing;Toileting;Grooming ?OT Transfers Functional Problem(s): Toilet;Tub/Shower ?OT Additional Impairment(s): None ?OT Plan ?OT Intensity: Minimum of 1-2 x/day, 45 to 90 minutes ?OT Frequency: 5 out of 7 days ?OT Duration/Estimated Length of Stay: 12-14 days ?OT Treatment/Interventions: Balance/vestibular training;Cognitive remediation/compensation;Discharge planning;DME/adaptive equipment instruction;Functional mobility training;Pain management;Psychosocial support;Patient/family education;Self Care/advanced ADL retraining;Therapeutic Activities;Therapeutic Exercise;UE/LE Strength taining/ROM;UE/LE Coordination activities ?OT Self Feeding Anticipated Outcome(s): independent ?OT Basic Self-Care Anticipated Outcome(s): supervision UB, min A LB ?OT Toileting Anticipated Outcome(s): min A ?OT Bathroom Transfers Anticipated Outcome(s): MIN A ?OT Recommendation ?Patient destination: Home ?Follow Up Recommendations: Home health OT ?Equipment Recommended: To be determined ? ? ?OT Evaluation ?Precautions/Restrictions  ?Precautions ?Precautions: Fall ?Precaution Comments: L radial heart cath on 4/5 ?Restrictions ?Weight Bearing Restrictions: No ?  ?Pain ?Pain Assessment ?Pain Score: 0-No pain ?Home Living/Prior Functioning ?Home Living ?Available Help at Discharge: Family, Available 24 hours/day ?Type of Home: House ?Home Access: Ramped entrance ?Home Layout: One level ?Bathroom Shower/Tub: Walk-in shower ?Bathroom Toilet: Standard ?Bathroom Accessibility: Yes ? Lives With: Family ?Prior Function ? Able to Take Stairs?:  Yes ?Driving: Yes ?Vocation: Retired ?Vision ?Baseline Vision/History: 1 Wears glasses ?Ability to See in Adequate Light: 0 Adequate ?Patient Visual Report: No change from baseline ?Vision Assessment?: Yes ?Eye Alignment:

## 2021-06-09 NOTE — Progress Notes (Signed)
? ? Advanced Heart Failure Rounding Note ? ?PCP-Cardiologist: Larae Grooms, MD  ? ?Subjective:   ? ?Discharged to CIR 4/26 ? ?Labs yesterday - Scr stable 2.1, K 4.6, mag 2.1 ? ?SBP 90s-100s ? ?Seen during OT session today. No dyspnea or CP.  ? ?Requiring quite a bit of assistance with mobility and tasks such as dressing. ? ? ?Objective:   ?Weight Range: ?82.1 kg ?Body mass index is 20.39 kg/m?.  ? ?Vital Signs:   ?Temp:  [97.5 ?F (36.4 ?C)-98.4 ?F (36.9 ?C)] 97.7 ?F (36.5 ?C) (04/27 0415) ?Pulse Rate:  [66-76] 66 (04/27 0415) ?Resp:  [16-19] 19 (04/27 0415) ?BP: (102-103)/(66-70) 102/67 (04/27 0415) ?SpO2:  [100 %] 100 % (04/27 0415) ?Weight:  [82.1 kg-83.2 kg] 82.1 kg (04/27 0439) ?Last BM Date : 06/09/21 ? ?Weight change: ?Filed Weights  ? 06/08/21 1735 06/09/21 0439  ?Weight: 83.2 kg 82.1 kg  ? ? ?Intake/Output:  ? ?Intake/Output Summary (Last 24 hours) at 06/09/2021 1621 ?Last data filed at 06/09/2021 1330 ?Gross per 24 hour  ?Intake 360 ml  ?Output 850 ml  ?Net -490 ml  ?  ? ? ?Physical Exam  ?  ?General:  No distress. Sitting up in bed. ?HEENT: Normal ?Neck: Supple. No JVD . Carotids 2+ bilat; no bruits.  ?Cor: PMI nondisplaced. Regular rate & rhythm. No rubs, gallops, 2/6 MR ?Lungs: Clear ?Abdomen: Soft, nontender, nondistended. No hepatosplenomegaly.  ?Extremities: No cyanosis, clubbing, rash, edema ?Neuro: Alert & orientedx3, cranial nerves grossly intact. moves all 4 extremities w/o difficulty. Affect pleasant ? ? ?Telemetry  ? ?Not on tele ? ? ?Labs  ?  ?CBC ?Recent Labs  ?  06/08/21 ?0405 06/09/21 ?1200  ?WBC 7.6 6.8  ?NEUTROABS  --  4.8  ?HGB 12.4* 14.2  ?HCT 39.5 43.5  ?MCV 81.1 81.2  ?PLT 161 154  ? ?Basic Metabolic Panel ?Recent Labs  ?  06/08/21 ?0405 06/09/21 ?0600  ?NA 134* 135  ?K 4.8 4.6  ?CL 96* 97*  ?CO2 30 31  ?GLUCOSE 87 88  ?BUN 54* 54*  ?CREATININE 2.10* 2.11*  ?CALCIUM 9.2 9.4  ?MG  --  2.1  ? ?Liver Function Tests ?Recent Labs  ?  06/09/21 ?0600  ?AST 55*  ?ALT 31  ?ALKPHOS 91   ?BILITOT 0.9  ?PROT 7.2  ?ALBUMIN 3.2*  ? ?No results for input(s): LIPASE, AMYLASE in the last 72 hours. ?Cardiac Enzymes ?No results for input(s): CKTOTAL, CKMB, CKMBINDEX, TROPONINI in the last 72 hours. ? ?BNP: ?BNP (last 3 results) ?Recent Labs  ?  05/09/21 ?[id number] 05/10/21 ?0205 05/13/21 ?2142  ?BNP >4,500.0* >4,500.0* >4,500.0*  ? ? ?ProBNP (last 3 results) ?No results for input(s): PROBNP in the last 8760 hours. ? ? ?D-Dimer ?No results for input(s): DDIMER in the last 72 hours. ?Hemoglobin A1C ?No results for input(s): HGBA1C in the last 72 hours. ?Fasting Lipid Panel ?No results for input(s): CHOL, HDL, LDLCALC, TRIG, CHOLHDL, LDLDIRECT in the last 72 hours. ?Thyroid Function Tests ?No results for input(s): TSH, T4TOTAL, T3FREE, THYROIDAB in the last 72 hours. ? ?Invalid input(s): FREET3 ? ?Other results: ? ? ?Imaging  ? ? ?No results found. ? ? ?Medications:   ? ? ?Scheduled Medications: ? amiodarone  400 mg Oral BID  ? apixaban  5 mg Oral BID  ? atorvastatin  10 mg Oral Daily  ? feeding supplement  1 Container Oral TID BM  ? leptospermum manuka honey  1 application. Topical Daily  ? magnesium oxide  400 mg Oral Daily  ?  mexiletine  300 mg Oral Q12H  ? midodrine  15 mg Oral TID WC  ? pantoprazole  40 mg Oral Daily  ? torsemide  40 mg Oral Daily  ? ? ?Infusions: ? ? ?PRN Medications: ?acetaminophen, diphenhydrAMINE, guaiFENesin-dextromethorphan, methocarbamol, prochlorperazine **OR** prochlorperazine **OR** prochlorperazine, senna-docusate, sodium phosphate, sorbitol, traMADol, traZODone ? ? ? ?Patient Profile  ? ?81 y.o. male with history of chronic biventricular HF, NICM, CAD, severe MR/TR, HTN, obesity and multiple recent admissions,  admitted w/ acute PE, a/c CHF and frequent PVCs.  ?  ?Echo, EF 25%, mildly dilated RV with moderately decreased systolic function, severe MR and TR. IVC dilated ?  ?Underwent MitraClip 4/20.  ?Discharged to CIR 4/26  ? ?Assessment/Plan  ? ?1. Chronic Biventricular HF   ?-  Recent admit for a/c CHF requiring IV Lasix and milrinone  ?- ECHO 04/2020 EF 25-30% with mild MR.   ?- Echo 04/04/21 EF LVEF 20-25%, RV moderately reduced, LA/RA severely dilated, and severe MR.  ?- Echo per Dr. 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 Not candidate for PVC ablation. S/P MTEER but still with 3+ MR.  ?- stable off milrinone. Volume status stable  ?- continue torsemide 40 mg daily. Scr stable on yesterday's labs. ?- GDMT limited d/t hypotension and AKI. Continue midodrine 15 mg TID with soft BP to help w/ renal perfusion  ?- Off Toprol XL with soft BP.   ?- Off Spiro w/ elevated K and SCr ?  ?2. CAD ?- LHC w/ mild non-obstructive CAD ?- denies CP ?- continue statin ?- no ASA w/ Eliquis ?- off ? blocker w/ soft BP  ?  ?3. Mitral Regurgitation ?- Severe on echo 02/23 ?- TEE showed mild restriction of P2. Severe central/posterior MR due to P2 restriction and annular dilation ?- functional, severely dilated LA and LV  ?- S/P MTEER => still with 3+ MR.  ?  ?4. Tricuspid Regurgitation  ?- moderate on TEE  ?- functional, severely dilated RA and RV   ?- HF optimization per above  ?  ?5. Acute PE ?- On Eliquis.  ?- No evidence of DVT on [place] ?- ? 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  ?6. Hyperlipidemia ?- Continue statin ?  ?8. AKI on CKD IIIa ?- Previous Scr  baseline 1.4-1.7. Last few months ~ 2.1  ?- Suspect cardiorenal syndrome. ?- SCr remains stable at 2.1 today  ?- follow BMP. Has repeat labs ordered for 05/01. ?  ?9. NSVT/PVCs ?- recent high burden, suspect contributing to CM  ?-  Slightly improved post MTEER ?- continue mexiletine 300 mg BID  ?- continue amiodarone 400 mg bid for now but will ultimately need dose reduction  ?- Keep K > 4 and Mag > 2 ?- per EP, he is not an ablation candidate ?  ? 10. Debility ?- c/w therapy ?- appreciate CIR's assistance  ? ? ? ?Length of Stay: 1 ? ?Lyda Jester, PA-C  ?06/09/2021, 4:21 PM ? ?Advanced Heart Failure Team ?Pager [phone] (M-F; 7a - 5p)  ?Please contact [facility] for night-coverage after hours (5p -7a )  and weekends on [email or web address] ?  ?

## 2021-06-09 NOTE — Progress Notes (Signed)
Patient ID: Matthew [name], male   DOB: 06/14/1940, 80 y.o.   MRN: [id number] ?Met with the patient to introduce self, rehab process, team conference and plan of care. Reviewed medications and dietary modifications to manage secondary risk factors including HF, PE, HTN, and HLD. Discussed heart healthy diet and food items to increase protein. Reviewed need to weigh daily and the zone tool for actions to take based on weight. Also reviewed skin care/safty. Continue to follow along to discharge to address educational needs to facilitate preparation for discharge. Sharp, Deborah B ? ?

## 2021-06-09 NOTE — Progress Notes (Deleted)
Entered in error

## 2021-06-09 NOTE — Evaluation (Signed)
Physical Therapy Assessment and Plan ? ?Patient Details  ?Name: Matthew [name] ?MRN: [id number] ?Date of Birth: 20-Jun-1940 ? ?PT Diagnosis: Abnormal posture, Abnormality of gait, Cognitive deficits, and Muscle weakness ?Rehab Potential: Fair ?ELOS: 14-16 days  ? ?Today's Date: 06/09/2021 ?PT Individual Time: [id number] ?PT Individual Time Calculation (min): 64 min  and Today's Date: 06/09/2021 ?PT Missed Time: 11 Minutes ?Missed Time Reason: Patient ill (Comment)  ? ?Hospital Problem: Principal Problem: ?  Heart failure ([place]) ?Active Problems: ?  Debility ? ? ?Past Medical History:  ?Past Medical History:  ?Diagnosis Date  ? CAD (coronary artery disease)   ? Chronic HFrEF (heart failure with reduced ejection fraction) ([place])   ? Chronic kidney disease, stage 3b ([employer])   ? Encephalopathy 04/2021  ? admitted for metabolic encephalopathy  ? HTN (hypertension)   ? Hyperlipidemia   ? NICM (nonischemic cardiomyopathy) ([place])   ? Obesity   ? Osteoarthritis   ? S/P mitral valve clip implantation 06/02/2021  ? MitraClip XTW x2 with Dr. Ali Lowe and Dr. Burt Knack  ? Severe mitral regurgitation   ? Thoracic aortic aneurysm (TAA) ([place])   ? Thrombocytopenia ([place])   ? noted in labs in 2023  ? Tricuspid regurgitation   ? ?Past Surgical History:  ?Past Surgical History:  ?Procedure Laterality Date  ? CHONDROPLASTY Left 01/11/2017  ? Procedure: CHONDROPLASTY of medial and patella compartment;  Surgeon: Dorna Leitz, MD;  Location: [facility];  Service: Orthopedics;  Laterality: Left;  ? KNEE ARTHROSCOPY Left 01/11/2017  ? Procedure: ARTHROSCOPIC IRRIGATION AND DEBRIDMENT KNEE;  Surgeon: Dorna Leitz, MD;  Location: [facility];  Service: Orthopedics;  Laterality: Left;  ? KNEE SURGERY    ? MENISECTOMY Left 01/11/2017  ? Procedure: Lateral MENISECTOMY;  Surgeon: Dorna Leitz, MD;  Location: [facility];  Service: Orthopedics;  Laterality: Left;  ? MITRAL VALVE REPAIR N/A 06/02/2021  ? Procedure: MITRAL VALVE REPAIR;  Surgeon: Early Osmond, MD;  Location:  [place] [facility];  Service: Cardiovascular;  Laterality: N/A;  ? RIGHT/LEFT HEART CATH AND CORONARY ANGIOGRAPHY N/A 05/18/2021  ? Procedure: RIGHT/LEFT HEART CATH AND CORONARY ANGIOGRAPHY;  Surgeon: Jolaine Artist, MD;  Location: [facility];  Service: Cardiovascular;  Laterality: N/A;  ? SYNOVECTOMY Left 01/11/2017  ? Procedure: Four compartment SYNOVECTOMY;  Surgeon: Dorna Leitz, MD;  Location: [place] [facility];  Service: Orthopedics;  Laterality: Left;  ? TEE WITHOUT CARDIOVERSION N/A 05/18/2021  ? Procedure: TRANSESOPHAGEAL ECHOCARDIOGRAM (TEE);  Surgeon: Jolaine Artist, MD;  Location: [facility];  Service: Cardiovascular;  Laterality: N/A;  ? TEE WITHOUT CARDIOVERSION N/A 06/02/2021  ? Procedure: TRANSESOPHAGEAL ECHOCARDIOGRAM (TEE);  Surgeon: Early Osmond, MD;  Location: [facility];  Service: Cardiovascular;  Laterality: N/A;  ? ? ?Assessment & Plan ?Clinical Impression: Patient is a 81 y.o. year old male with a history of chronic biventricular heart failure who presented to the emergency department on 05/13/2021 for significant shortness of breath x1 hour while watching TV.  He had been evaluated that morning in the heart failure clinic and plans were in place for right and left heart catheterization and TEE in the next few weeks per Dr. Haroldine Laws.  He was to continue Toprol-XL, Enestro, spironolactone and [place].  He had recently been admitted with acute kidney injury on 3/26 and discharged on 3/28.  CTA of the chest was performed concerning for emboli.  Frequent PVCs monitored. He was started on heparin infusion and admitted to the medicine service for further management.  ?  ?  He underwent multiple imaging procedures including bilateral lower extremity venous duplex without evidence of DVT, echocardiogram with findings of EF approximately 25% and severely decreased left ventricular function, bilateral lower extremity ABIs with noncompressible vessels.  Right and left heart catheterization  on 4/5 with findings of severe nonischemic cardiomyopathy.  The patient is not a candidate for MitraClip currently.  Dr. Haroldine Laws recommended suppressing PVCs more fully as they may be contributing to his LV dysfunction: serum potassium level maintained greater than 4 and magnesium level greater than 2. ?  ?GDMT held secondary to hypotension. Cardiology consulted, Dr. Gardiner Rhyme, on 4/1.  Started on Lasix intravenously.  Serum creatinine monitored in the setting of chronic kidney disease stage IIIb.  Heparin infusion transitioned to Eliquis 5 mg twice daily.  Cardiac MRI done to rule out sarcoidosis.  NSVT and PVCs continued but improved on amiodarone infusion and mexiletine.  Electrolytes monitored. Lactic acidosis resolved with milrinone. Repeat TEE 4/20 with estimated EF 20-25%.  He underwent transcatheter edge to edge MVR by Dr. Lenna Sciara on 4/20. He developed post-procedure hypoxia and was reintubated in the holding area. He was extubated the following day. He was transferred to heart failure service. Not a candidate for PVC ablation. Progressed with OT and PT. Tolerating diet. The patient requires inpatient medicine and rehabilitation evaluations and services for ongoing dysfunction secondary to multiple medical problems including heart failure. ? ?Patient currently requires max with mobility secondary to muscle weakness, decreased cardiorespiratoy endurance, unbalanced muscle activation, decreased coordination, and decreased motor planning, decreased visual acuity, decreased visual perceptual skills, and decreased visual motor skills, decreased motor planning, decreased initiation, decreased attention, decreased awareness, decreased problem solving, decreased safety awareness, decreased memory, and delayed processing, and decreased sitting balance, decreased standing balance, decreased postural control, and decreased balance strategies.  Prior to hospitalization, patient was modified independent  with  mobility and lived with Family, Other (Comment) in a House home.  Home access is  Ramped entrance. ? ?Patient will benefit from skilled PT intervention to maximize safe functional mobility, minimize fall risk, and decrease caregiver burden for planned discharge home with 24 hour assist.  Anticipate patient will benefit from follow up [facility] at discharge. ? ?PT - End of Session ?Activity Tolerance: Tolerates < 10 min activity, no significant change in vital signs ?Endurance Deficit: Yes ?PT Assessment ?Rehab Potential (ACUTE/IP ONLY): Fair ?PT Patient demonstrates impairments in the following area(s): Balance;Edema;Endurance;Motor;Behavior;Nutrition;Pain;Perception;Safety;Sensory;Skin Integrity ?PT Transfers Functional Problem(s): Bed Mobility;Bed to Chair;Car;Furniture ?PT Locomotion Functional Problem(s): Ambulation;Wheelchair Mobility;Stairs ?PT Plan ?PT Intensity: Minimum of 1-2 x/day ,45 to 90 minutes ?PT Frequency: 5 out of 7 days ?PT Duration Estimated Length of Stay: 14-16 days ?PT Treatment/Interventions: Ambulation/gait training;Cognitive remediation/compensation;Discharge planning;DME/adaptive equipment instruction;Functional mobility training;Pain management;Psychosocial support;Splinting/orthotics;Therapeutic Activities;UE/LE Strength taining/ROM;Visual/perceptual remediation/compensation;Balance/vestibular training;Disease management/prevention;Functional electrical stimulation;Community reintegration;Neuromuscular re-education;Patient/family education;Skin care/wound management;Stair training;Therapeutic Exercise;UE/LE Coordination activities;Wheelchair propulsion/positioning ?PT Transfers Anticipated Outcome(s): CGA ?PT Locomotion Anticipated Outcome(s): CGA household ?PT Recommendation ?Follow Up Recommendations: Home health PT ?Patient destination: Home ?Equipment Recommended: To be determined ? ? ?PT Evaluation ?Precautions/Restrictions ?Precautions ?Precautions: Fall ?Precaution Comments: L radial  heart cath on 4/5 ?Restrictions ?Weight Bearing Restrictions: No ?General ?PT Amount of Missed Time (min): 11 Minutes ?PT Missed Treatment Reason: Patient ill (Comment) Vital Signs  ?Pain ?Pain Assessment ?Pa

## 2021-06-10 DIAGNOSIS — S81801A Unspecified open wound, right lower leg, initial encounter: Secondary | ICD-10-CM

## 2021-06-10 DIAGNOSIS — I5023 Acute on chronic systolic (congestive) heart failure: Secondary | ICD-10-CM

## 2021-06-10 MED ORDER — ALUM & MAG HYDROXIDE-SIMETH 200-200-20 MG/5ML PO SUSP: 30.0000 mL | Freq: Four times a day (QID) | ORAL | Status: DC | PRN

## 2021-06-10 MED ORDER — ALUM & MAG HYDROXIDE-SIMETH 200-200-20 MG/5ML PO SUSP
30.0000 mL | Freq: Once | ORAL | Status: AC
  Administered 2021-06-10: 30 mL via ORAL
  Filled 2021-06-10: qty 30

## 2021-06-10 MED ORDER — HYDROCORTISONE (PERIANAL) 2.5 % EX CREA
TOPICAL_CREAM | Freq: Two times a day (BID) | CUTANEOUS | Status: DC
Start: 2021-06-10 — End: 2021-06-10

## 2021-06-10 NOTE — Progress Notes (Signed)
Called regarding onset of chest pain at rest. Described as burning.No c/o nausea, SOB, palpitations, diaphoresis. VSS. SaO2 100% on RA. Maalox ordered. EKG reviewed. No apparent ST changes or dysrhythmia. Pain resolved. Continue to monitor.  ?

## 2021-06-10 NOTE — Progress Notes (Addendum)
Patient complained of chest pain, stated pain felt like burning. PA sandra notified, EKG and maalox ordered. During EKG patient stated pain had subsided.  ?

## 2021-06-10 NOTE — Progress Notes (Signed)
?                                                       PROGRESS NOTE ? ? ?Subjective/Complaints: ? ?Pt reports he is feeling well. Sitting in his chair and eating lunch.  He had some chest discomfort his AM that resolved with maalox.  He had an EKG completed with no reported ST changes. He has felt well since this time. Denies pain. No concerns or complaints at this time.  ? ? ?ROS: Patient denies fever, rash, sore throat, blurred vision, dizziness, nausea, vomiting, diarrhea, cough, shortness of breath or chest pain, headache, or mood change.  ? ?Objective: ?  ?No results found. ?Recent Labs  ?  06/08/21 ?0405 06/09/21 ?1200  ?WBC 7.6 6.8  ?HGB 12.4* 14.2  ?HCT 39.5 43.5  ?PLT 161 154  ? ?Recent Labs  ?  06/08/21 ?0405 06/09/21 ?0600  ?NA 134* 135  ?K 4.8 4.6  ?CL 96* 97*  ?CO2 30 31  ?GLUCOSE 87 88  ?BUN 54* 54*  ?CREATININE 2.10* 2.11*  ?CALCIUM 9.2 9.4  ? ? ?Intake/Output Summary (Last 24 hours) at 06/10/2021 1251 ?Last data filed at 06/10/2021 0707 ?Gross per 24 hour  ?Intake 1020 ml  ?Output 600 ml  ?Net 420 ml  ?  ? ?Pressure Injury 06/03/21 Sacrum Left Stage 2 -  Partial thickness loss of dermis presenting as a shallow open injury with a red, pink wound bed without slough. (Active)  ?06/03/21 1800  ?Location: Sacrum  ?Location Orientation: Left  ?Staging: Stage 2 -  Partial thickness loss of dermis presenting as a shallow open injury with a red, pink wound bed without slough.  ?Wound Description (Comments):   ?Present on Admission:   ?   ?Pressure Injury 06/08/21 Sacrum Right Stage 2 -  Partial thickness loss of dermis presenting as a shallow open injury with a red, pink wound bed without slough. (Active)  ?06/08/21 1755  ?Location: Sacrum  ?Location Orientation: Right  ?Staging: Stage 2 -  Partial thickness loss of dermis presenting as a shallow open injury with a red, pink wound bed without slough.  ?Wound Description (Comments):   ?Present on Admission: Yes  ? ? ?Physical Exam: ?Vital Signs ?Blood  pressure 97/64, pulse 61, temperature (!) 97.5 ?F (36.4 ?C), temperature source Oral, resp. rate 18, height 6\' 7"  (2.007 m), weight 81.8 kg, SpO2 100 %. ? ?Constitutional: No distress . Vital signs reviewed. ?HEENT: NCAT, EOMI, oral membranes moist ?Neck: supple ?Cardiovascular: RRR  ?Respiratory/Chest: CTA Bilaterally without wheezes or rales. Normal effort    ?GI/Abdomen: BS +, non-tender, non-distended, Soft  ?Ext: no cyanosis ?Psych: very pleasant and cooperative  ? ?Skin: ?Comments: Small skin ulcer on RLE covered in bordered foam dressing, minimal drainage ?Neurological:  ?Alert and oriented ?General: No focal deficit present. Moving all 4 extremities ?     ?  ? ?Assessment/Plan: ?1. Functional deficits which require 3+ hours per day of interdisciplinary therapy in a comprehensive inpatient rehab setting. ?Physiatrist is providing close team supervision and 24 hour management of active medical problems listed below. ?Physiatrist and rehab team continue to assess barriers to discharge/monitor patient progress toward functional and medical goals ? ?Care Tool: ? ?Bathing ?   ?Body parts bathed by patient: Chest, Left arm, Right arm, Abdomen, Right upper leg, Left  upper leg, Face  ? Body parts bathed by helper: Front perineal area, Buttocks, Right lower leg, Left lower leg ?  ?  ?Bathing assist Assist Level: Moderate Assistance - Patient 50 - 74% ?  ?  ?Upper Body Dressing/Undressing ?Upper body dressing   ?What is the patient wearing?: Pull over shirt ?   ?Upper body assist Assist Level: Moderate Assistance - Patient 50 - 74% ?   ?Lower Body Dressing/Undressing ?Lower body dressing ? ? ?   ?What is the patient wearing?: Pants ? ?  ? ?Lower body assist Assist for lower body dressing: Maximal Assistance - Patient 25 - 49% ?   ? ?Toileting ?Toileting    ?Toileting assist Assist for toileting: Total Assistance - Patient < 25% ?  ?  ?Transfers ?Chair/bed transfer ? ?Transfers assist ?   ? ?Chair/bed transfer assist  level: Moderate Assistance - Patient 50 - 74% ?  ?  ?Locomotion ?Ambulation ? ? ?Ambulation assist ? ?   ? ?Assist level: 2 helpers ?Assistive device: Akhtar-rolling ?Max distance: 3  ? ?Walk 10 feet activity ? ? ?Assist ? Walk 10 feet activity did not occur: Safety/medical concerns ? ?  ?   ? ?Walk 50 feet activity ? ? ?Assist Walk 50 feet with 2 turns activity did not occur: Safety/medical concerns ? ?  ?   ? ? ?Walk 150 feet activity ? ? ?Assist Walk 150 feet activity did not occur: Safety/medical concerns ? ?  ?  ?  ? ?Walk 10 feet on uneven surface  ?activity ? ? ?Assist Walk 10 feet on uneven surfaces activity did not occur: Safety/medical concerns ? ? ?  ?   ? ?Wheelchair ? ? ? ? ?Assist Is the patient using a wheelchair?: Yes ?Type of Wheelchair: Manual ?  ? ?Wheelchair assist level: Minimal Assistance - Patient > 75% ?Max wheelchair distance: 37  ? ? ?Wheelchair 50 feet with 2 turns activity ? ? ? ?Assist ? ?  ?  ? ? ?Assist Level: Minimal Assistance - Patient > 75%  ? ?Wheelchair 150 feet activity  ? ? ? ?Assist ?   ? ? ?Assist Level: Moderate Assistance - Patient 50 - 74%  ? ?Blood pressure 97/64, pulse 61, temperature (!) 97.5 ?F (36.4 ?C), temperature source Oral, resp. rate 18, height 6\' 7"  (2.007 m), weight 81.8 kg, SpO2 100 %. ? ?Medical Problem List and Plan: ?1. Functional deficits secondary to heart failure/debility ?            -patient may shower ?            -ELOS/Goals: S 10-14 days ?            -first day of evaluations- con't CIR- PT and OT ?2.  Antithrombotics: ?-DVT/anticoagulation:  Pharmaceutical: Other (comment)Eliquis 5 mg BID ?            -antiplatelet therapy: none ?3. Pain Management: Tylenol as needed ? 4/27- denies pain- con't regimen ?4. Mood: LCSW to evaluate and provide emotional support ?            -antipsychotic agents: n/a ?5. Neuropsych: This patient is capable of making decisions on his own behalf. ?6. Skin/Wound Care: Routine skin care checks ?            -- continue  local wound care to RLE with Medihoney ?7. Fluids/Electrolytes/Nutrition: Strict Is and Os and follow-up chemistries + mag in AM ?            --per  HF team>>K+ greater than 4 and mag greater than 2 ? 4/27- K+ 4.6- and Mg 2.1- con't regimen ?8: Nonischemic cardiomyopathy ?9: Acute on chronic biventricular heart failure (off Toprol Xl due to soft BP) ?            --daily weight ?            --continue torsemide 40 mg daily ?-- continue midodrine 15 mg 3 times daily ?-- continue amiodarone 400 mg twice daily ?--mexilitine 300 mg BID ?-- Mag-Ox 400 mg daily ?-Daily weights ?4/27- down 1 kg ?4/28-81.8kg continue to monitor ?10: Valvular heart disease: s/p TEER MVR, still with 3+ MR ?--tricuspid regurg>>moderate on TEE ?11: Acute pulmonary embolism: ?--continue Eliquis 5 mg twice daily ?12: Acute kidney injury atop chronic kidney disease stage IIIb; serum Cr is baseline 1.4-1.7 ?            --current Cr 2.1>>follow-up BMP ?--off spironolactone ?4/27- Cr stable today at 2.11 and BUN 54- is worse than baseline- however don't want to give IVFs right now due to CHF exacerbation just got over- will encourage him to drink and monitor ? 4/28 will order repeat labs for monday ?13: Dyslipidemia: ?-- continue Lipitor 10 mg daily ?14: Ascending aortic aneurysm ?-- follow-up CT surgery as outpatient ?15: CAD: Left heart cath with mild nonobstructive CAD, continue statin ?16: NSVT/PVCs: Goal to keep potassium greater than 4 and magnesium greater than 2 ?--heart meds as above #9 ?--not ablation candidate ?17: Right lower extremity wound: Local wound care ?            -- Bilateral ABIs noncompressible ?  -Continue medihoney and bordered foam dressing ?18: Hyponatremia: serum sodium 134; limit free water>>follow-up BMP ?4/27- Na 135- con't to monitor ?19: Hyperkalemia currently resolved>>follow-up BMP ?            --keep K+ greater than 4 ? -4/27 K+ is 4.6, continue to monitor ?20. Using condom catheter- con't at night if need be.  ?21.  Gastroesophageal Reflux ? -PRN Maalox ? ? ?LOS: ?2 days ?A FACE TO FACE EVALUATION WAS PERFORMED ? ?Jennye Boroughs ?06/10/2021, 12:51 PM  ? ? ? ?

## 2021-06-10 NOTE — Plan of Care (Signed)
?  Problem: Consults ?Goal: RH GENERAL PATIENT EDUCATION ?Description: See Patient Education module for education specifics. ?Outcome: Progressing ?  ?Problem: RH BOWEL ELIMINATION ?Goal: RH STG MANAGE BOWEL WITH ASSISTANCE ?Description: STG Manage Bowel with mod I Assistance. ?Outcome: Progressing ?  ?Problem: RH BLADDER ELIMINATION ?Goal: RH STG MANAGE BLADDER WITH ASSISTANCE ?Description: STG Manage Bladder With toileting Assistance ?Outcome: Progressing ?  ?Problem: RH SKIN INTEGRITY ?Goal: RH STG MAINTAIN SKIN INTEGRITY WITH ASSISTANCE ?Description: STG Maintain Skin Integrity With min Assistance. ?Outcome: Progressing ?Goal: RH STG ABLE TO PERFORM INCISION/WOUND CARE W/ASSISTANCE ?Description: STG Able To Perform Incision/Wound Care With min Assistance. ?Outcome: Progressing ?  ?Problem: RH SAFETY ?Goal: RH STG ADHERE TO SAFETY PRECAUTIONS W/ASSISTANCE/DEVICE ?Description: STG Adhere to Safety Precautions With cues Assistance/Device. ?Outcome: Progressing ?  ?Problem: RH KNOWLEDGE DEFICIT GENERAL ?Goal: RH STG INCREASE KNOWLEDGE OF SELF CARE AFTER HOSPITALIZATION ?Description: Patient and family will be able to manage care at discharge using handouts and educational tools independently ?Outcome: Progressing ?  ?

## 2021-06-10 NOTE — Progress Notes (Signed)
Physical Therapy Session Note ? ?Patient Details  ?Name: Matthew [name] ?MRN: [id number] ?Date of Birth: 05-26-1940 ? ?Today's Date: 06/10/2021 ?PT Individual Time: 1410-1435 ?PT Individual Time Calculation (min): 25 min  ? ?Short Term Goals: ?Week 1:  PT Short Term Goal 1 (Week 1): Patient will complete bed mobility with MinA ?PT Short Term Goal 2 (Week 1): Patient will complete sit <> stand with LRAD and ModA consistently ?PT Short Term Goal 3 (Week 1): Patient will ambulate >24ft with LRAD and ModA ?Week 2:    ?Week 3:    ? ?Skilled Therapeutic Interventions/Progress Updates:  ? ?Pt received sitting in recliner and agreeable to PT. Pt performed sit<>stand transfer with mod assist and RW with moderate cues for anterior weight shift and use ot RW to improve posture and knee extension once in standing. Performed x 3 from recliner.  ? ?Seated therex.  ?LAQ x 10  ?Hip abduction x 10 with manual resistance ?Hip adduction x 10 with manual resistance ?Hip flexion x 10  ?Ankle DF/PF x 15 ?Hip extension from flexed position x 10 with manual resistance.  ? ?Pt left sitting in recliner with call bell in reach and all needs in place.  ? ? ? ?   ? ?Therapy Documentation ?Precautions:  ?Precautions ?Precautions: Fall ?Precaution Comments: L radial heart cath on 4/5 ?Restrictions ?Weight Bearing Restrictions: No ? ?  ?Vital Signs: ?Therapy Vitals ?Temp: 97.9 ?F (36.6 ?C) ?Temp Source: Oral ?Pulse Rate: 65 ?Resp: 16 ?BP: 108/74 ?Patient Position (if appropriate): Sitting ?Oxygen Therapy ?SpO2: 100 % ?O2 Device: Room Air ?Pain: ?Pain Assessment ?Pain Score: 0-No pain ? ?Therapy/Group: Individual Therapy ? ?[place] ?06/10/2021, 2:40 PM  ?

## 2021-06-10 NOTE — Progress Notes (Addendum)
Speech Language Pathology Daily Session Note ? ?Patient Details  ?Name: Matthew [name] ?MRN: [id number] ?Date of Birth: Jul 21, 1940 ? ?Today's Date: 06/10/2021 ?SLP Individual Time: 0258-5277 ?SLP Individual Time Calculation (min): 45 min ? ?Short Term Goals: ?Week 1: SLP Short Term Goal 1 (Week 1): Patient will participate in further cognitive-linguistic assessment ?SLP Short Term Goal 1 - Progress (Week 1): Met ?SLP Short Term Goal 2 (Week 1): Patient will complete basic-to-mildly complex problem solving with mod A verbal/visual cues ?SLP Short Term Goal 3 (Week 1): Patient will identify at least 2 safety precautions associated with new changes/limitations with mod A verbal cues ?SLP Short Term Goal 4 (Week 1): Patient will recall new, daily information with mod A for use of external memory aids ? ?Skilled Therapeutic Interventions:Skilled ST services focused on cognitive skills. SLP facilitated finished administration of cognitive linguistic assessment SLUMS, pt scored 6/30 (n=>25) indicating severe deficits in awareness, problem solving, recall and sustained attention. SLP questions HOH verse auditory process verse possible dementia. Pt was would often respond to basic questions with an off the topic response. Pt supports he is able to hear, but required SLP to repeat most verbal request, impairment appears more related to auditory processing. Pt expressed limited reading ability, therefore only very simple 1-2 word written aids would be beneficial as an external aid for recall. SLP recommends communicating with pt's daughter to obtain a better cognitive baseline. Pt was left in room with call bell within reach and chair alarm set. SLP recommends to continue skilled services. ? ?   ? ?Pain ?Pain Assessment ?Pain Score: 0-No pain ? ?Therapy/Group: Individual Therapy ? ?[name] ?06/10/2021, 1:35 PM ?

## 2021-06-10 NOTE — Progress Notes (Signed)
Occupational Therapy Session Note ? ?Patient Details  ?Name: Matthew [name] ?MRN: [id number] ?Date of Birth: 20-Aug-1940 ? ?Today's Date: 06/10/2021 ?OT Individual Time: 5093-2671 and 1455-1540 ?OT Individual Time Calculation (min): 60 min and 45 min ? ? ?Short Term Goals: ?Week 1:  OT Short Term Goal 1 (Week 1): pt will consistently stand from wc with CGA to RW ?OT Short Term Goal 2 (Week 1): Pt will demonstrate improved perception and motor planning to don a shirt with set up. ?OT Short Term Goal 3 (Week 1): Pt will complete toilet transfer with min A. ?OT Short Term Goal 4 (Week 1): Pt will don pants with mod A. ? ?Skilled Therapeutic Interventions/Progress Updates:  ?  Visit 1: Pain: no c/o pain - pt reports he is feeling much better than last night ?Pt received in bed sleeping but awoke easily.  Pt declined a shower but agreeable to bathing at EOB.  Sat to EOB with Supervision then completed UB bathing with supervision and cues. Handed pt his long sleeved tshirt. Pt had great difficulty problem solving, orienting shirt, sequencing task and ultimately needed mod cues and mod A. ? ?He then bathed legs in sitting.   Sit to stand from elevated bed min to RW with cues to push up with his R hand.   ?Pt needed max A to wash bottom and manage clothing over hips. ? ?He ambulated with RW from side of bed around bed to recliner with mod-max A. Pt has very poor balance due to kyphotic posture and L foot is inverted slightly so he is not getting full weight on L left foot.   ? ?Pt sat on recliner with mod A to lower safely  ?To sitting.  Pt in recliner with belt alarm on and all needs met.  ? ? ? ?Visit 2: Pain: no c/o pain  ? ?Pt received in recliner with a friend visiting. Pt engaged in BUE AROM with 3# dowel bar.  Sit to stands with heavy mod A from low recliner.  Helped pt don his shoes. His shoes seemed too big. ?Worked on standing for 2 min at a time with RW with pt unable to extend trunk. ? ? ?With +2 A from NT,  pt  ambulated with Rw in room to walk around side of bed.  Sat on edge of bed and then able to lift legs into bed. Pt resting in bed with all needs met.  ? ?Therapy Documentation ?Precautions:  ?Precautions ?Precautions: Fall ?Precaution Comments: L radial heart cath on 4/5 ?Restrictions ?Weight Bearing Restrictions: No ? ?Pain: ?Pain Assessment ?Pain Scale: 0-10 ?Pain Score: 0-No pain ?ADL: ?ADL ?Eating: Set up ?Grooming: Minimal cueing, Minimal assistance ?Upper Body Bathing: Supervision/safety ?Where Assessed-Upper Body Bathing: Wheelchair ?Lower Body Bathing: Moderate assistance ?Where Assessed-Lower Body Bathing: Wheelchair ?Upper Body Dressing: Moderate assistance ?Where Assessed-Upper Body Dressing: Wheelchair ?Lower Body Dressing: Maximal assistance ?Where Assessed-Lower Body Dressing: Wheelchair ?Toileting: Maximal assistance ?Where Assessed-Toileting: Bedside Commode ?Toilet Transfer: Moderate assistance ?Toilet Transfer Method: Stand pivot ? ? ?Therapy/Group: Individual Therapy ? ?[place] ?06/10/2021, 9:24 AM ?

## 2021-06-11 NOTE — IPOC Note (Signed)
Overall Plan of Care (IPOC) ?Patient Details ?Name: Matthew [name] ?MRN: [id number] ?DOB: [birth year] ? ?Admitting Diagnosis: Heart failure (HCC) ? ?Hospital Problems: Principal Problem: ?  Heart failure (HCC) ?Active Problems: ?  Debility ?  Open wound of right lower extremity ? ? ? ? Functional Problem List: ?Nursing Bladder, Bowel, Endurance, Safety, Medication Management  ?PT Balance, Edema, Endurance, Motor, Behavior, Nutrition, Pain, Perception, Safety, Sensory, Skin Integrity  ?OT Balance, Endurance, Motor, Cognition, Perception, Safety, Pain  ?SLP Cognition  ?TR    ?    ? Basic ADL?s: ?OT Bathing, Dressing, Toileting, Grooming  ? ?  Advanced  ADL?s: ?OT    ?   ?Transfers: ?PT Bed Mobility, Bed to Chair, Car, Furniture  ?OT Toilet, Tub/Shower  ? ?  Locomotion: ?PT Ambulation, Wheelchair Mobility, Stairs  ? ?  Additional Impairments: ?OT None  ?SLP Communication, Social Cognition ?comprehension ?Problem Solving, Memory, Attention, Awareness  ?TR    ? ? ?Anticipated Outcomes ?Item Anticipated Outcome  ?Self Feeding independent  ?Swallowing ?   ?  ?Basic self-care ? supervision UB, min A LB  ?Toileting ? min A ?  ?Bathroom Transfers MIN A  ?Bowel/Bladder ? manage bowel w mod I and bladder w toileting  ?Transfers ? CGA  ?Locomotion ? CGA household  ?Communication ?    ?Cognition ? min A  ?Pain ? N/A  ?Safety/Judgment ? manage safet w cues  ? ?Therapy Plan: ?PT Intensity: Minimum of 1-2 x/day ,45 to 90 minutes ?PT Frequency: 5 out of 7 days ?PT Duration Estimated Length of Stay: 14-16 days ?OT Intensity: Minimum of 1-2 x/day, 45 to 90 minutes ?OT Frequency: 5 out of 7 days ?OT Duration/Estimated Length of Stay: 12-14 days ?SLP Intensity: Minumum of 1-2 x/day, 30 to 90 minutes ?SLP Frequency: 3 to 5 out of 7 days ?SLP Duration/Estimated Length of Stay: 12-14 days  ? ?Due to the current state of emergency, patients may not be receiving their 3-hours of Medicare-mandated therapy. ? ? Team Interventions: ?Nursing  Interventions Patient/Family Education, Bowel Management, Skin Care/Wound Management, Medication Management, Disease Management/Prevention, Bladder Management, Discharge Planning  ?PT interventions Ambulation/gait training, Cognitive remediation/compensation, Discharge planning, DME/adaptive equipment instruction, Functional mobility training, Pain management, Psychosocial support, Splinting/orthotics, Therapeutic Activities, UE/LE Strength taining/ROM, Visual/perceptual remediation/compensation, Balance/vestibular training, Disease management/prevention, Functional electrical stimulation, Community reintegration, Neuromuscular re-education, Patient/family education, Skin care/wound management, Stair training, Therapeutic Exercise, UE/LE Coordination activities, Wheelchair propulsion/positioning  ?OT Interventions Balance/vestibular training, Cognitive remediation/compensation, Discharge planning, DME/adaptive equipment instruction, Functional mobility training, Pain management, Psychosocial support, Patient/family education, Self Care/advanced ADL retraining, Therapeutic Activities, Therapeutic Exercise, UE/LE Strength taining/ROM, UE/LE Coordination activities  ?SLP Interventions Cognitive remediation/compensation, Internal/external aids, Functional tasks, Patient/family education, Therapeutic Activities, Environmental controls  ?TR Interventions    ?SW/CM Interventions Discharge Planning, Psychosocial Support, Patient/Family Education  ? ?Barriers to Discharge ?MD  Medical stability  ?Nursing Decreased caregiver support ?1 level ramped entry, w daughter, has DME and receiving HH services from [facility]  ?PT   ?   ?OT   ?   ?SLP   ?   ?SW   ?   ? ?Team Discharge Planning: ?Destination: PT-Home ,OT- Home , SLP-Home ?Projected Follow-up: PT-Home health PT, OT-  Home health OT, SLP-Other (comment), 24 hour supervision/assistance (TBD) ?Projected Equipment Needs: PT-To be determined, OT- To be determined, SLP-None  recommended by SLP ?Equipment Details: PT- , OT-  ?Patient/family involved in discharge planning: PT- Patient, Family member/caregiver,  OT-Patient, Patient unable/family or caregiver not available, SLP-Patient, Family member/caregiver ? ?MD  ELOS: 10-14d ?Medical Rehab Prognosis:  Good ?Assessment: The patient has been admitted for CIR therapies with the diagnosis of Debility due to CHF. The team will be addressing functional mobility, strength, stamina, balance, safety, adaptive techniques and equipment, self-care, bowel and bladder mgt, patient and caregiver education, fluid balance. Goals have been set at Sup. Anticipated discharge destination is Home. ? ? ? ?  ? ? ?See Team Conference Notes for weekly updates to the plan of care  ?

## 2021-06-11 NOTE — Progress Notes (Signed)
Physical Therapy Session Note ? ?Patient Details  ?Name: Matthew [name] ?MRN: [id number] ?Date of Birth: Dec 21, 1940 ? ?Today's Date: 06/11/2021 ?PT Individual Time: [id number] ?PT Individual Time Calculation (min): 54 min for session 1 and 40 min for session 2. ? ?Short Term Goals: ?Week 1:  PT Short Term Goal 1 (Week 1): Patient will complete bed mobility with MinA ?PT Short Term Goal 2 (Week 1): Patient will complete sit <> stand with LRAD and ModA consistently ?PT Short Term Goal 3 (Week 1): Patient will ambulate >47ft with LRAD and ModA ? ?Skilled Therapeutic Interventions/Progress Updates:  ?Session 1 ?Pt in bed on arrival and agreeable to PT at this time. Bed alarm turned off.  ? ?Min assist for supine>sitting  EOB with HOB ~40 degrees and rails used. Increased time and effort needed. Once pt was sitting pt able to use UE's to scoot closer to edge of bed. Min assist to stand up to RW and min assist with cues on sequencing for pivot transfer bed>wheelchair. Pt transported to [facility] rehab gym via wheelchair. Once in gym mod assist to stand from wheelchair to RW, then min assist for pivot transfer wheelchair to NuStep. NuStep with UE/LE's on level 3 x 8 minutes with goal >/= 65 steps per minute for strengthening and activity tolerance. Mod assist to stand from NuStep to RW and min assist to transfer back to wheelchair (stand pivot). Back in wheelchair PTA moved wheelchair in a better position for gait: standing to [place] with min assist this time, pt walked toward door and into hallway for 23 feet with min assist, wheelchair follow, before needing a rest break. Pt's pants/depends falling down as well. Transported pt back to room via wheelchair. Standing to [place] with min assist from wheelchair, min guard to static standing at [place] with dependent for depends to be changed and pants pulled back up. Since standing transfer via step pivot from wheelchair to recliner with min assist. Seated in recliner had pt perform the  following with bil LE's: 2.5# weight for long arc quads and marching 2 sets of 10 reps.  ? ?Pt left in recliner with belt alarm on, feet up and needs in reach.  ? ? ?Session 2 ?Pt at EOB on arrival with nursing at bedside. Agreed to PT at this time. ? ? ?TRANSFERS: ?Multiple reps of sit<>stands bed<>RW with min assist down grading to min guard assist with bed elevated to get hips equal to knee height. ? ? ?STRENGTHENING:  ?In standing at [facility] with min guard to min assist: heel raises, mini squats, alternating marching in place and alternating lateral stepping out/back in for 2 sets of 10 reps each with seated rest breaks between each set. ?Seated at edge of bed: with level 2 band for bil LE's: long arc quads, hamstring curls, marching and clamshells for 2 sets of 10 reps with rest breaks between each set. ?Supine in bed with bil LE's: level 2 band for assist DF/resisted PF for 2 sets of 10 reps, then with no resistance for straight leg raises for 2 sets of 10 reps with emphasis on maintaining as much knee extension as pt is able to get with assist as needed. ? ? ?BED MOBILITY ?Pt able to lie to supine from edge of bed with min guard assist, HOB 20 degrees. Pt then able to use UE's to scoot hips more central in bed. ? ?Pt left with HOB 40 degrees, bed alarm on and all needs in reach at end of session.  ? ?  Therapy Documentation ?Precautions:  ?Precautions ?Precautions: Fall ?Precaution Comments: L radial heart cath on 4/5 ?Restrictions ?Weight Bearing Restrictions: No ? ? ? ? ?Therapy/Group: Individual Therapy ? ?Willow Ora, PTA, CLT ?[place] ?06/11/21, 4:26 PM   ?

## 2021-06-11 NOTE — Plan of Care (Signed)
?  Problem: RH Awareness ?Goal: LTG: Patient will demonstrate awareness during functional activites type of (SLP) ?Description: LTG: Patient will demonstrate awareness during functional activites type of (SLP) ?Outcome: Completed/Met ?  ?Problem: RH Problem Solving ?Goal: LTG Patient will demonstrate problem solving for (SLP) ?Description: LTG:  Patient will demonstrate problem solving for basic/complex daily situations with cues  (SLP) ?Outcome: Not Applicable ?Note: Goal discontinued due to further discussion with patient's family reporting pt is at cognitive baseline - plan to discharge from [place] ?  ?Problem: RH Memory ?Goal: LTG Patient will use memory compensatory aids to (SLP) ?Description: LTG:  Patient will use memory compensatory aids to recall biographical/new, daily complex information with cues (SLP) ?Outcome: Not Applicable ?Note: Goal discontinued due to further discussion with patient's family reporting pt is at cognitive baseline - Plan to discharge from [place] ?  ?

## 2021-06-11 NOTE — Progress Notes (Signed)
Occupational Therapy Session Note ? ?Patient Details  ?Name: Matthew [name] ?MRN: [id number] ?Date of Birth: 03/30/1940 ? ?Today's Date: 06/11/2021 ?OT Individual Time: 0802-0904 ?OT Individual Time Calculation (min): 62 min  ? ? ?Short Term Goals: ?Week 1:  OT Short Term Goal 1 (Week 1): pt will consistently stand from wc with CGA to RW ?OT Short Term Goal 2 (Week 1): Pt will demonstrate improved perception and motor planning to don a shirt with set up. ?OT Short Term Goal 3 (Week 1): Pt will complete toilet transfer with min A. ?OT Short Term Goal 4 (Week 1): Pt will don pants with mod A. ? ?Skilled Therapeutic Interventions/Progress Updates:  ?  Pt received semi-reclined in bed, no c/o pain, agreeable to therapy. Session focus on self-care retraining, activity tolerance, transfer retraining in prep for improved ADL/IADL/func mobility performance + decreased caregiver burden. LPN in/out morning meds. Came to sitting EOB with close S and elevated HOB. Doffed shirt with S, donned new shirt with min A to pull over head. Bathed UB and upper BLE with S seated EOB and applied deodorant.  Donned shorts with mod A to pull over B hips but pt able to thread BLE himself. Min A from elevated height bed with power up with RW, heavy min A to manage RW during stand-pivot transfers throughout session. Total A w/c transport to and from gym. Swapped out 18x16 w/c for 20x18, higher height w/c with adjustable BLE leg rests.  ? ?Pt able to participate in several rounds of corn hole, min A to power up and CGA for balance throughout while tossing ball with unilateral UE support on RW. Additionally practiced side-stepping up and down mat with light assist to manage RW. Premorbid kyphotic posture noted. ? ?Pt left semi-reclined in bed with bed alarm engaged, call bell in reach, and all immediate needs met.  ? ? ?Therapy Documentation ?Precautions:  ?Precautions ?Precautions: Fall ?Precaution Comments: L radial heart cath on  4/5 ?Restrictions ?Weight Bearing Restrictions: No ? ?Pain: no c/o throughout ?  ?ADL: See Care Tool for more details. ? ? ?Therapy/Group: Individual Therapy ? ?Rachel A Stevenson MS, OTR/L ? ?06/11/2021, 6:55 AM ?

## 2021-06-11 NOTE — Progress Notes (Addendum)
Speech Language Pathology Discharge Summary ? ?Patient Details  ?Name: Matthew [name] ?MRN: [id number] ?Date of Birth: Mar 03, 1940 ? ?Today's Date: 06/11/2021 ?SLP Individual Time: 1430-1500 ?SLP Individual Time Calculation (min): 30 min ? ?Skilled Therapeutic Interventions: Skilled ST treatment focused on cognitive goals. Patient was in bed on arrival; alert and oriented x4. Pt was able to recall general information regarding prior therapy sessions from today and utilized therapy schedule with mod I to confirm. Pt recognized therapist and stated, "didn't I meet you the day before yesterday?" (accurate). Pt demonstrated intellectual awareness by verbalizing physical changes from recent hospitalizations and their impact on his independence with sup A verbal cues. Pt elaborated further on prior level of function by reporting "I've struggled with reading and writing my whole life." Throughout discussion, pt consistently required verbal repetition of auditory information and extended processing time. Pt agreeable to SLP calling daughter to obtain further information on pt's cognitive baseline. Contacted daughter by phone. Similar to discussion with pt's son, daughter also felt that patient is at cognitive baseline and denied acute cognitive-communication or speech changes. Daughter endorsed prior deficits in comprehension, memory; difficulty with reading/writing which she attributes to low education - stated "I don't think he finished middle school." Daughter stated pt has always struggled with "comprehension" and needs things repeated to understand. She denied acute speech changes however endorsed noticeable hoarseness which was perceivably improved today. Informed daughter that ENT referral may be appropriate if vocal hoarseness persists, however this already appears to be improving based on today's observations. Daughter managed all iADLs and some ADLs including cooking, cleaning, laundry. Daughter stated pt stopped  driving a few months ago and she does not anticipate he'll return. Considering patient is likely at baseline functioning from a cognitive-communication standpoint, further SLP services do not appear clinically warranted at this time. Daughter verbalized agreement and denied any concerns pertaining to cognitive-communication/speech/language/swallowing at this time. Plan to sign off. ? ?Patient has met 1 of 1 long term goals.  Patient to discharge at overall Supervision level.  ?Reasons goals not met: Memory and problem solving goals discontinued - pt likely at baseline level of function from a cognitive-communciation perspective per further discussion with family  ? ?Clinical Impression/Discharge Summary: Patient has met 1 of 1 long-term goals this admission due to consistent intellectual awareness of deficits. As mentioned above, memory and problem solving goals were discontinued following further discussion/insight from pt's family, who informed SLP that patient is likely at baseline from a cognitive-communication perspective and denied acute cognitive-communication or speech changes. Daughter supported deficits in memory and comprehension at prior level; reading/writing difficulties are from childhood. Patient's care partner is independent to provide the necessary physical and cognitive assistance at discharge. Daughter verbalized agreement with plans to discharge from SLP services at this time. Follow up services do not appear clinically indicated.  ? ?Care Partner:  ?Caregiver Able to Provide Assistance: Yes  ?Type of Caregiver Assistance: Physical;Cognitive ? ?Recommendation:  ?24 hour supervision/assistance  ?Rationale for SLP Follow Up: Other (comment) (No follow up SLP services are warranted)  ? ?Equipment: None  ? ?Reasons for discharge: Other (comment);Treatment goals met (Pt at cognitive baseline)  ? ?Patient/Family Agrees with Progress Made and Goals Achieved: Yes  ? ? ?Nella Botsford T Shauniece Kwan ?06/11/2021,  4:28 PM ? ?

## 2021-06-11 NOTE — Progress Notes (Signed)
?                                                       PROGRESS NOTE ? ? ?Subjective/Complaints: ? ? No issues overnite  ? ?ROS: Patient denies CP, SOB, N/V/D ? ?Objective: ?  ?No results found. ?Recent Labs  ?  06/09/21 ?1200  ?WBC 6.8  ?HGB 14.2  ?HCT 43.5  ?PLT 154  ? ? ?Recent Labs  ?  06/09/21 ?0600  ?NA 135  ?K 4.6  ?CL 97*  ?CO2 31  ?GLUCOSE 88  ?BUN 54*  ?CREATININE 2.11*  ?CALCIUM 9.4  ? ? ? ?Intake/Output Summary (Last 24 hours) at 06/11/2021 0954 ?Last data filed at 06/11/2021 0900 ?Gross per 24 hour  ?Intake 360 ml  ?Output 1100 ml  ?Net -740 ml  ? ?  ? ?Pressure Injury 06/03/21 Sacrum Left Stage 2 -  Partial thickness loss of dermis presenting as a shallow open injury with a red, pink wound bed without slough. (Active)  ?06/03/21 1800  ?Location: Sacrum  ?Location Orientation: Left  ?Staging: Stage 2 -  Partial thickness loss of dermis presenting as a shallow open injury with a red, pink wound bed without slough.  ?Wound Description (Comments):   ?Present on Admission:   ?   ?Pressure Injury 06/08/21 Sacrum Right Stage 2 -  Partial thickness loss of dermis presenting as a shallow open injury with a red, pink wound bed without slough. (Active)  ?06/08/21 1755  ?Location: Sacrum  ?Location Orientation: Right  ?Staging: Stage 2 -  Partial thickness loss of dermis presenting as a shallow open injury with a red, pink wound bed without slough.  ?Wound Description (Comments):   ?Present on Admission: Yes  ? ? ?Physical Exam: ?Vital Signs ?Blood pressure 99/61, pulse 67, temperature 97.7 ?F (36.5 ?C), temperature source Oral, resp. rate 18, height 6\' 7"  (2.007 m), weight 82.4 kg, SpO2 100 %. ? ? ?General: No acute distress ?Mood and affect are appropriate ?Heart: Regular rate and rhythm no rubs murmurs or extra sounds ?Lungs: Clear to auscultation, breathing unlabored, no rales or wheezes ?Abdomen: Positive bowel sounds, soft nontender to palpation, nondistended ?Extremities: No clubbing, cyanosis, or  edema ?Skin: No evidence of breakdown, no evidence of rash ? ?Skin: ?Comments: Small skin ulcer on RLE covered in bordered foam dressing, minimal drainage ?Neurological:  ?Alert and oriented ?General: No focal deficit present. Moving all 4 extremities ?     ?  ? ?Assessment/Plan: ?1. Functional deficits which require 3+ hours per day of interdisciplinary therapy in a comprehensive inpatient rehab setting. ?Physiatrist is providing close team supervision and 24 hour management of active medical problems listed below. ?Physiatrist and rehab team continue to assess barriers to discharge/monitor patient progress toward functional and medical goals ? ?Care Tool: ? ?Bathing ?   ?Body parts bathed by patient: Chest, Left arm, Right arm, Abdomen, Right upper leg, Left upper leg, Face  ? Body parts bathed by helper: Front perineal area, Buttocks, Right lower leg, Left lower leg ?  ?  ?Bathing assist Assist Level: Moderate Assistance - Patient 50 - 74% ?  ?  ?Upper Body Dressing/Undressing ?Upper body dressing   ?What is the patient wearing?: Pull over shirt ?   ?Upper body assist Assist Level: Moderate Assistance - Patient 50 - 74% ?   ?  Lower Body Dressing/Undressing ?Lower body dressing ? ? ?   ?What is the patient wearing?: Pants ? ?  ? ?Lower body assist Assist for lower body dressing: Maximal Assistance - Patient 25 - 49% ?   ? ?Toileting ?Toileting    ?Toileting assist Assist for toileting: Total Assistance - Patient < 25% ?  ?  ?Transfers ?Chair/bed transfer ? ?Transfers assist ?   ? ?Chair/bed transfer assist level: Moderate Assistance - Patient 50 - 74% ?  ?  ?Locomotion ?Ambulation ? ? ?Ambulation assist ? ?   ? ?Assist level: 2 helpers ?Assistive device: Aumiller-rolling ?Max distance: 3  ? ?Walk 10 feet activity ? ? ?Assist ? Walk 10 feet activity did not occur: Safety/medical concerns ? ?  ?   ? ?Walk 50 feet activity ? ? ?Assist Walk 50 feet with 2 turns activity did not occur: Safety/medical concerns ? ?  ?    ? ? ?Walk 150 feet activity ? ? ?Assist Walk 150 feet activity did not occur: Safety/medical concerns ? ?  ?  ?  ? ?Walk 10 feet on uneven surface  ?activity ? ? ?Assist Walk 10 feet on uneven surfaces activity did not occur: Safety/medical concerns ? ? ?  ?   ? ?Wheelchair ? ? ? ? ?Assist Is the patient using a wheelchair?: Yes ?Type of Wheelchair: Manual ?  ? ?Wheelchair assist level: Minimal Assistance - Patient > 75% ?Max wheelchair distance: 93  ? ? ?Wheelchair 50 feet with 2 turns activity ? ? ? ?Assist ? ?  ?  ? ? ?Assist Level: Minimal Assistance - Patient > 75%  ? ?Wheelchair 150 feet activity  ? ? ? ?Assist ?   ? ? ?Assist Level: Moderate Assistance - Patient 50 - 74%  ? ?Blood pressure 99/61, pulse 67, temperature 97.7 ?F (36.5 ?C), temperature source Oral, resp. rate 18, height 6\' 7"  (2.007 m), weight 82.4 kg, SpO2 100 %. ? ?Medical Problem List and Plan: ?1. Functional deficits secondary to heart failure/debility ?            -patient may shower ?            -ELOS/Goals: S 10-14 days ?            -first day of evaluations- con't CIR- PT and OT ?2.  Antithrombotics: ?-DVT/anticoagulation:  Pharmaceutical: Other (comment)Eliquis 5 mg BID ?            -antiplatelet therapy: none ?3. Pain Management: Tylenol as needed ? 4/27- denies pain- con't regimen ?4. Mood: LCSW to evaluate and provide emotional support ?            -antipsychotic agents: n/a ?5. Neuropsych: This patient is capable of making decisions on his own behalf. ?6. Skin/Wound Care: Routine skin care checks ?            -- continue local wound care to RLE with Medihoney ?7. Fluids/Electrolytes/Nutrition: Strict Is and Os and follow-up chemistries + mag in AM ?            --per HF team>>K+ greater than 4 and mag greater than 2 ? 4/27- K+ 4.6- and Mg 2.1- con't regimen ?8: Nonischemic cardiomyopathy ?9: Acute on chronic biventricular heart failure (off Toprol Xl due to soft BP) ?            --daily weight ?            --continue torsemide 40 mg  daily ?-- continue midodrine 15 mg 3 times  daily ?-- continue amiodarone 400 mg twice daily ?--mexilitine 300 mg BID ?-- Mag-Ox 400 mg daily ? ?10: Valvular heart disease: s/p TEER MVR, still with 3+ MR ?--tricuspid regurg>>moderate on TEE ?11: Acute pulmonary embolism: ?--continue Eliquis 5 mg twice daily ?12: Acute kidney injury atop chronic kidney disease stage IIIb; serum Cr is baseline 1.4-1.7 ?            --current Cr 2.1>>follow-up BMP ?--off spironolactone ?4/27- Cr stable today at 2.11 and BUN 54- is worse than baseline- however don't want to give IVFs right now due to CHF exacerbation just got over- will encourage him to drink and monitor ? 4/28 will order repeat labs for monday ?13: Dyslipidemia: ?-- continue Lipitor 10 mg daily ?14: Ascending aortic aneurysm ?-- follow-up CT surgery as outpatient ?15: CAD: Left heart cath with mild nonobstructive CAD, continue statin ?16: NSVT/PVCs: Goal to keep potassium greater than 4 and magnesium greater than 2 ?--heart meds as above #9 ?--not ablation candidate ?17: Right lower extremity wound: Local wound care ?            -- Bilateral ABIs noncompressible ?  -Continue medihoney and bordered foam dressing ?18: Hyponatremia: serum sodium 134; limit free water>>follow-up BMP ?4/27- Na 135- con't to monitor ?19: Hyperkalemia currently resolved>>follow-up BMP ?            --keep K+ greater than 4 ? -4/27 K+ is 4.6, continue to monitor ?20. Using condom catheter- con't at night if need be.  ?21. Gastroesophageal Reflux ? -PRN Maalox ? ? ?LOS: ?3 days ?A FACE TO FACE EVALUATION WAS PERFORMED ? ?Luanna Salk Kerstie Agent ?06/11/2021, 9:54 AM  ? ? ? ?

## 2021-06-12 LAB — URINALYSIS, ROUTINE W REFLEX MICROSCOPIC
Bilirubin Urine: NEGATIVE
Glucose, UA: NEGATIVE mg/dL
Ketones, ur: NEGATIVE mg/dL
Nitrite: NEGATIVE
Protein, ur: NEGATIVE mg/dL
Specific Gravity, Urine: 1.009 (ref 1.005–1.030)
WBC, UA: >50 WBC/hpf — ABNORMAL HIGH (ref 0–5)
pH: 6 (ref 5.0–8.0)

## 2021-06-12 NOTE — Progress Notes (Signed)
PROGRESS NOTE   Subjective/Complaints: Patient has had no overnight complaint or concerns. However, noticed that canister of urine from male purewick was very cloudy today.  Patient is alert and oriented and denies acute urinary symptoms but seems a little slow to respond to questions.  ROS: Negative for chills, fever, dysuria, urgency, or incontinence  Objective:   No results found. No results for input(s): WBC, HGB, HCT, PLT in the last 72 hours. No results for input(s): NA, K, CL, CO2, GLUCOSE, BUN, CREATININE, CALCIUM in the last 72 hours.  Intake/Output Summary (Last 24 hours) at 06/12/2021 1528 Last data filed at 06/12/2021 0950 Gross per 24 hour  Intake 600 ml  Output 800 ml  Net -200 ml     Pressure Injury 06/03/21 Sacrum Left Stage 2 -  Partial thickness loss of dermis presenting as a shallow open injury with a red, pink wound bed without slough. (Active)  06/03/21 1800  Location: Sacrum  Location Orientation: Left  Staging: Stage 2 -  Partial thickness loss of dermis presenting as a shallow open injury with a red, pink wound bed without slough.  Wound Description (Comments):   Present on Admission:      Pressure Injury 06/08/21 Sacrum Right Stage 2 -  Partial thickness loss of dermis presenting as a shallow open injury with a red, pink wound bed without slough. (Active)  06/08/21 1755  Location: Sacrum  Location Orientation: Right  Staging: Stage 2 -  Partial thickness loss of dermis presenting as a shallow open injury with a red, pink wound bed without slough.  Wound Description (Comments):   Present on Admission: Yes    Physical Exam: Vital Signs Blood pressure 103/68, pulse 62, temperature 98 F (36.7 C), resp. rate 18, height 6\' 7"  (2.007 m), weight 83.2 kg, SpO2 100 %.  Constitutional: No distress . Vital signs reviewed. HEENT: NCAT, EOMI, oral membranes moist Neck: supple Cardiovascular: RRR without  murmur. No JVD    Respiratory/Chest: CTA Bilaterally without wheezes or rales. Normal effort    GI/Abdomen: BS +, non-tender, non-distended Neurological: alert and oriented, some delay in answering questions, but able to respond appropriately.  Moving all 4 extremities. GU: Cloudy urine in canister at bedside Ext: no clubbing, cyanosis, or edema Psych: pleasant and cooperative   Skin: Comments: Small skin ulcer on RLE covered in bordered foam dressing, minimal drainage.  Assessment/Plan: 1. Functional deficits which require 3+ hours per day of interdisciplinary therapy in a comprehensive inpatient rehab setting. Physiatrist is providing close team supervision and 24 hour management of active medical problems listed below. Physiatrist and rehab team continue to assess barriers to discharge/monitor patient progress toward functional and medical goals  Care Tool:  Bathing    Body parts bathed by patient: Chest, Left arm, Right arm, Abdomen, Right upper leg, Left upper leg, Face   Body parts bathed by helper: Front perineal area, Buttocks, Right lower leg, Left lower leg     Bathing assist Assist Level: Moderate Assistance - Patient 50 - 74%     Upper Body Dressing/Undressing Upper body dressing   What is the patient wearing?: Pull over shirt    Upper body assist Assist Level:  Minimal Assistance - Patient > 75%    Lower Body Dressing/Undressing Lower body dressing      What is the patient wearing?: Pants     Lower body assist Assist for lower body dressing: Moderate Assistance - Patient 50 - 74%     Toileting Toileting    Toileting assist Assist for toileting: Total Assistance - Patient < 25%     Transfers Chair/bed transfer  Transfers assist     Chair/bed transfer assist level: Minimal Assistance - Patient > 75%     Locomotion Ambulation   Ambulation assist      Assist level: Minimal Assistance - Patient > 75% Assistive device: Bradsher-rolling Max  distance: 23 feet   Walk 10 feet activity   Assist  Walk 10 feet activity did not occur: Safety/medical concerns  Assist level: Minimal Assistance - Patient > 75% Assistive device: Duthie-rolling   Walk 50 feet activity   Assist Walk 50 feet with 2 turns activity did not occur: Safety/medical concerns         Walk 150 feet activity   Assist Walk 150 feet activity did not occur: Safety/medical concerns         Walk 10 feet on uneven surface  activity   Assist Walk 10 feet on uneven surfaces activity did not occur: Safety/medical concerns         Wheelchair     Assist Is the patient using a wheelchair?: Yes Type of Wheelchair: Manual    Wheelchair assist level: Minimal Assistance - Patient > 75% Max wheelchair distance: 85    Wheelchair 50 feet with 2 turns activity    Assist        Assist Level: Minimal Assistance - Patient > 75%   Wheelchair 150 feet activity     Assist      Assist Level: Moderate Assistance - Patient 50 - 74%   Blood pressure 103/68, pulse 62, temperature 98 F (36.7 C), resp. rate 18, height  (2.007 m), weight 83.2 kg, SpO2 100 %.  Medical Problem List and Plan: 1. Functional deficits secondary to heart failure/debility             -patient may shower             -ELOS/Goals: S 10-14 days             -first day of evaluations- con't CIR- PT and OT 2.  Antithrombotics: -DVT/anticoagulation:  Pharmaceutical: Other (comment)Eliquis 5 mg BID             -antiplatelet therapy: none 3. Pain Management: Tylenol as needed             4/27- denies pain- con't regimen 4. Mood: LCSW to evaluate and provide emotional support             -antipsychotic agents: n/a 5. Neuropsych: This patient is capable of making decisions on his own behalf. 6. Skin/Wound Care: Routine skin care checks             -- continue local wound care to RLE with Medihoney             -4/30 Continue MediHoney, site covered by foam pad. 7.  Fluids/Electrolytes/Nutrition: Strict Is and Os and follow-up chemistries + mag in AM             --per HF team>>K+ greater than 4 and mag greater than 2             4/27- K+ 4.6- and  Mg 2.1- con't regimen             -4/30 qMonday labs to trend 8: Nonischemic cardiomyopathy 9: Acute on chronic biventricular heart failure (off Toprol Xl due to soft BP)             --daily weight             --continue torsemide 40 mg daily -- continue midodrine 15 mg 3 times daily -- continue amiodarone 400 mg twice daily --mexilitine 300 mg BID -- Mag-Ox 400 mg daily   10: Valvular heart disease: s/p TEER MVR, still with 3+ MR --tricuspid regurg>>moderate on TEE 11: Acute pulmonary embolism: --continue Eliquis 5 mg twice daily 12: Acute kidney injury atop chronic kidney disease stage IIIb; serum Cr is baseline 1.4-1.7             --current Cr 2.1>>follow-up BMP --off spironolactone 4/27- Cr stable today at 2.11 and BUN 54- is worse than baseline- however don't want to give IVFs right now due to CHF exacerbation just got over- will encourage him to drink and monitor             4/28 will order repeat labs for Monday            -4/30 qMonday labs to trend 13: Dyslipidemia: -- continue Lipitor 10 mg daily 14: Ascending aortic aneurysm -- follow-up CT surgery as outpatient 15: CAD: Left heart cath with mild nonobstructive CAD, continue statin 16: NSVT/PVCs: Goal to keep potassium greater than 4 and magnesium greater than 2 --heart meds as above #9 --not ablation candidate 17: Right lower extremity wound: Local wound care             -- Bilateral ABIs noncompressible              -Continue medihoney and bordered foam dressing 18: Hyponatremia: serum sodium 134; limit free water>>follow-up BMP 4/27- Na 135- con't to monitor 19: Hyperkalemia currently resolved>>follow-up BMP             --keep K+ greater than 4             -4/27 K+ is 4.6, continue to monitor             -4/30 qMonday labs to  trend 20. Using condom catheter- con't at night if need be.  21. Gastroesophageal Reflux             -PRN Maalox             -4/30 Patient has not reported reflux over past few days.  22. Cloudy urine/? UTI  -4/30 Pt's urine today noted to be very cloudy. Alert and oriented but some delay in answering questions.  Denies urinary symptoms such as burning, increased frequency, hesitation. -UA with urine culture placed due to change in color, possible change in mental status with delayed response to questions. -4/30 UA returned: + large leukocytes, mod Hgb, few bacteria.  Per Dr. Wynn Banker, wait for culture before deciding on treatment.    LOS: 4 days A FACE TO FACE EVALUATION WAS PERFORMED  Tressia Miners 06/12/2021, 3:28 PM

## 2021-06-13 DIAGNOSIS — I5022 Chronic systolic (congestive) heart failure: Secondary | ICD-10-CM

## 2021-06-13 LAB — CBC WITH DIFFERENTIAL/PLATELET
Abs Immature Granulocytes: 0.01 10*3/uL (ref 0.00–0.07)
Basophils Absolute: 0 10*3/uL (ref 0.0–0.1)
Basophils Relative: 1 %
Eosinophils Absolute: 0.1 10*3/uL (ref 0.0–0.5)
Eosinophils Relative: 2 %
HCT: 37 % — ABNORMAL LOW (ref 39.0–52.0)
Hemoglobin: 11.8 g/dL — ABNORMAL LOW (ref 13.0–17.0)
Immature Granulocytes: 0 %
Lymphocytes Relative: 19 %
Lymphs Abs: 1 10*3/uL (ref 0.7–4.0)
MCH: 25.9 pg — ABNORMAL LOW (ref 26.0–34.0)
MCHC: 31.9 g/dL (ref 30.0–36.0)
MCV: 81.1 fL (ref 80.0–100.0)
Monocytes Absolute: 0.8 10*3/uL (ref 0.1–1.0)
Monocytes Relative: 15 %
Neutro Abs: 3.2 10*3/uL (ref 1.7–7.7)
Neutrophils Relative %: 63 %
Platelets: 134 10*3/uL — ABNORMAL LOW (ref 150–400)
RBC: 4.56 MIL/uL (ref 4.22–5.81)
RDW: 20.6 % — ABNORMAL HIGH (ref 11.5–15.5)
WBC: 5.2 10*3/uL (ref 4.0–10.5)
nRBC: 0 % (ref 0.0–0.2)

## 2021-06-13 LAB — BASIC METABOLIC PANEL
Anion gap: 11 (ref 5–15)
BUN: 61 mg/dL — ABNORMAL HIGH (ref 8–23)
CO2: 26 mmol/L (ref 22–32)
Calcium: 9.1 mg/dL (ref 8.9–10.3)
Chloride: 98 mmol/L (ref 98–111)
Creatinine, Ser: 2.38 mg/dL — ABNORMAL HIGH (ref 0.61–1.24)
GFR, Estimated: 27 mL/min — ABNORMAL LOW (ref >60)
Glucose, Bld: 84 mg/dL (ref 70–99)
Potassium: 4.9 mmol/L (ref 3.5–5.1)
Sodium: 135 mmol/L (ref 135–145)

## 2021-06-13 LAB — MAGNESIUM: Magnesium: 2.1 mg/dL (ref 1.7–2.4)

## 2021-06-13 NOTE — Progress Notes (Signed)
?                                                       PROGRESS NOTE ? ? ?Subjective/Complaints: No acute events overnight. Patient has no complaints or concerns this AM. ? ?ROS: Negative for chills, fever, dysuria, urgency, or incontinence. No HA. Denies pain. ? ?Objective: ?  ?No results found. ?Recent Labs  ?  06/13/21 ?0614  ?WBC 5.2  ?HGB 11.8*  ?HCT 37.0*  ?PLT 134*  ? ?Recent Labs  ?  06/13/21 ?0614  ?NA 135  ?K 4.9  ?CL 98  ?CO2 26  ?GLUCOSE 84  ?BUN 61*  ?CREATININE 2.38*  ?CALCIUM 9.1  ? ? ?Intake/Output Summary (Last 24 hours) at 06/13/2021 0955 ?Last data filed at 06/13/2021 [id number] ?Gross per 24 hour  ?Intake 838 ml  ?Output 600 ml  ?Net 238 ml  ?  ? ?Pressure Injury 06/03/21 Sacrum Left Stage 2 -  Partial thickness loss of dermis presenting as a shallow open injury with a red, pink wound bed without slough. (Active)  ?06/03/21 1800  ?Location: Sacrum  ?Location Orientation: Left  ?Staging: Stage 2 -  Partial thickness loss of dermis presenting as a shallow open injury with a red, pink wound bed without slough.  ?Wound Description (Comments):   ?Present on Admission:   ?   ?Pressure Injury 06/08/21 Sacrum Right Stage 2 -  Partial thickness loss of dermis presenting as a shallow open injury with a red, pink wound bed without slough. (Active)  ?06/08/21 1755  ?Location: Sacrum  ?Location Orientation: Right  ?Staging: Stage 2 -  Partial thickness loss of dermis presenting as a shallow open injury with a red, pink wound bed without slough.  ?Wound Description (Comments):   ?Present on Admission: Yes  ? ? ?Physical Exam: ?Vital Signs ?Blood pressure 102/62, pulse 60, temperature 97.7 ?F (36.5 ?C), resp. rate 18, height 6\' 7"  (2.007 m), weight 81.7 kg, SpO2 100 %. ? ?Constitutional: No distress . Vital signs reviewed. ?HEENT: NCAT, EOMI, oral membranes moist ?Neck: supple ?Cardiovascular: RRR without murmur. No JVD    ?Respiratory/Chest: CTA Bilaterally without wheezes or rales. Normal effort    ?GI/Abdomen: BS +,  non-tender, non-distended ?Neurological: alert and oriented, respond appropriately.  Moving all 4 extremities. Grip strenght 5/5 in b/l UE ?Ext: no clubbing, cyanosis, or edema ?Psych: pleasant and cooperative  ? ?Skin: ?Comments: Small skin ulcer on RLE covered in bordered foam dressing, minimal drainage. No  surrounding errythema  ? ?Assessment/Plan: ?1. Functional deficits which require 3+ hours per day of interdisciplinary therapy in a comprehensive inpatient rehab setting. ?Physiatrist is providing close team supervision and 24 hour management of active medical problems listed below. ?Physiatrist and rehab team continue to assess barriers to discharge/monitor patient progress toward functional and medical goals ? ?Care Tool: ? ?Bathing ?   ?Body parts bathed by patient: Chest, Left arm, Right arm, Abdomen, Right upper leg, Left upper leg, Face  ? Body parts bathed by helper: Front perineal area, Buttocks, Right lower leg, Left lower leg ?  ?  ?Bathing assist Assist Level: Moderate Assistance - Patient 50 - 74% ?  ?  ?Upper Body Dressing/Undressing ?Upper body dressing   ?What is the patient wearing?: Pull over shirt ?   ?Upper body assist Assist Level: Minimal Assistance - Patient >  75% ?   ?Lower Body Dressing/Undressing ?Lower body dressing ? ? ?   ?What is the patient wearing?: Pants ? ?  ? ?Lower body assist Assist for lower body dressing: Moderate Assistance - Patient 50 - 74% ?   ? ?Toileting ?Toileting    ?Toileting assist Assist for toileting: Total Assistance - Patient < 25% ?  ?  ?Transfers ?Chair/bed transfer ? ?Transfers assist ?   ? ?Chair/bed transfer assist level: Minimal Assistance - Patient > 75% ?  ?  ?Locomotion ?Ambulation ? ? ?Ambulation assist ? ?   ? ?Assist level: Minimal Assistance - Patient > 75% ?Assistive device: Keziah-rolling ?Max distance: 23 feet  ? ?Walk 10 feet activity ? ? ?Assist ? Walk 10 feet activity did not occur: Safety/medical concerns ? ?Assist level: Minimal  Assistance - Patient > 75% ?Assistive device: Kincer-rolling  ? ?Walk 50 feet activity ? ? ?Assist Walk 50 feet with 2 turns activity did not occur: Safety/medical concerns ? ?  ?   ? ? ?Walk 150 feet activity ? ? ?Assist Walk 150 feet activity did not occur: Safety/medical concerns ? ?  ?  ?  ? ?Walk 10 feet on uneven surface  ?activity ? ? ?Assist Walk 10 feet on uneven surfaces activity did not occur: Safety/medical concerns ? ? ?  ?   ? ?Wheelchair ? ? ? ? ?Assist Is the patient using a wheelchair?: Yes ?Type of Wheelchair: Manual ?  ? ?Wheelchair assist level: Minimal Assistance - Patient > 75% ?Max wheelchair distance: 80  ? ? ?Wheelchair 50 feet with 2 turns activity ? ? ? ?Assist ? ?  ?  ? ? ?Assist Level: Minimal Assistance - Patient > 75%  ? ?Wheelchair 150 feet activity  ? ? ? ?Assist ?   ? ? ?Assist Level: Moderate Assistance - Patient 50 - 74%  ? ?Blood pressure 102/62, pulse 60, temperature 97.7 ?F (36.5 ?C), resp. rate 18, height 6\' 7"  (2.007 m), weight 81.7 kg, SpO2 100 %. ? ?Medical Problem List and Plan: ?1. Functional deficits secondary to heart failure/debility ?            -patient may shower ?            -ELOS/Goals: S 10-14 days ?            -first day of evaluations- con't CIR- PT and OT ?2.  Antithrombotics: ?-DVT/anticoagulation:  Pharmaceutical: Other (comment)Eliquis 5 mg BID ?            -antiplatelet therapy: none ?3. Pain Management: Tylenol as needed ?            4/27- denies pain- con't regimen ?4. Mood: LCSW to evaluate and provide emotional support ?            -antipsychotic agents: n/a ?5. Neuropsych: This patient is capable of making decisions on his own behalf. ? -5/1 Pt reports good mood overall ?6. Skin/Wound Care: Routine skin care checks ?            -- continue local wound care to RLE with Medihoney ?            -4/30 Continue MediHoney, site covered by foam pad. ?7. Fluids/Electrolytes/Nutrition: Strict Is and Os and follow-up chemistries + mag in AM ?            --per HF  team>>K+ greater than 4 and mag greater than 2 ?            4/27- K+  4.6- and Mg 2.1- con't regimen ?            -4/30 qMonday labs to trend ?8: Nonischemic cardiomyopathy ?9: Acute on chronic biventricular heart failure (off Toprol Xl due to soft BP) ?            --daily weight ?            --continue torsemide 40 mg daily ?-- continue midodrine 15 mg 3 times daily ?-- continue amiodarone 400 mg twice daily ?--mexilitine 300 mg BID ?-- Mag-Ox 400 mg daily ?10: Valvular heart disease: s/p TEER MVR, still with 3+ MR ?--tricuspid regurg>>moderate on TEE ?11: Acute pulmonary embolism: ?--continue Eliquis 5 mg twice daily ?12: Acute kidney injury atop chronic kidney disease stage IIIb; serum Cr is baseline 1.4-1.7 ?            --current Cr 2.1>>follow-up BMP ?--off spironolactone ?4/27- Cr stable today at 2.11 and BUN 54- is worse than baseline- however don't want to give IVFs right now due to CHF exacerbation just got over- will encourage him to drink and monitor ?            4/28 will order repeat labs for Monday ?           -4/30 qMonday labs to trend ?13: Dyslipidemia: ?-- continue Lipitor 10 mg daily ?14: Ascending aortic aneurysm ?-- follow-up CT surgery as outpatient ?15: CAD: Left heart cath with mild nonobstructive CAD, continue statin ?16: NSVT/PVCs: Goal to keep potassium greater than 4 and magnesium greater than 2 ?--heart meds as above #9 ?--not ablation candidate ?17: Right lower extremity wound: Local wound care ?            -- Bilateral ABIs noncompressible ?             -Continue medihoney and bordered foam dressing ?18: Hyponatremia: serum sodium 134; limit free water>>follow-up BMP ?4/27- Na 135- con't to monitor ?5/1- NA 135 continue to monitor ?19: Hyperkalemia currently resolved>>follow-up BMP ?            --keep K+ greater than 4 ?            -4/27 K+ is 4.6, continue to monitor ?            -4/30 qMonday labs to trend ? -5/1 K+ Wnl  ?20. Using condom catheter- con't at night if need be.  ?21.  Gastroesophageal Reflux ?            -PRN Maalox ?            -4/30 Patient has not reported reflux over past few days. ? 22. Cloudy urine/? UTI ? -4/30 Pt's urine today noted to be very cloudy. Alert and orien

## 2021-06-13 NOTE — Progress Notes (Signed)
Physical Therapy Session Note ? ?Patient Details  ?Name: Matthew [name] ?MRN: [id number] ?Date of Birth: Oct 27, 1940 ? ?Today's Date: 06/13/2021 ?PT Individual Time: 9381-8299 ?PT Individual Time Calculation (min): 70 min  ? ?Short Term Goals: ?Week 1:  PT Short Term Goal 1 (Week 1): Patient will complete bed mobility with MinA ?PT Short Term Goal 2 (Week 1): Patient will complete sit <> stand with LRAD and ModA consistently ?PT Short Term Goal 3 (Week 1): Patient will ambulate >84ft with LRAD and ModA ? ?Skilled Therapeutic Interventions/Progress Updates:  ?  Patient received sitting up in bed, agreeable to PT. He denies pain. Patient coming to sit edge of bed with supervision and verbal cues for motor planning/sequencing. Patient requiring Max verbal cues and hand over hand assist to don shirt and pants. Patient attempting to thread B UE through pants holes with L LE already through 1 pant hole. ModA to stand from elevated bed height with RW to pull up pants. ModA stand pivot to wc with RW- patient maintaining very hunched posture and intermittent knee buckling during very short transfer. PT transporting patient in wc to therapy gym for time management and energy conservation. Patient completing x3 sit <> stand from wc height and ModA and RW. Improving upright posture, but still maintaining stature of ~5'7". Patient ambulating ~36ft with RW and ModA + wc follow for safety. Poor B foot clearance, B feet ER, incerased B knee/hip flexion and trunk flexion. Patient abruptly sitting with poor eccentric control noted. Patient propelling himself in wc x126ft with supervision and Max verbal cues for sequencing/motor planning. Blocked practice stand pivot transfers wc <> therapy mat with RW, initially ModA progressing to MinA. Patient continues to require up to Max verbal cues for reaching back prior to sitting to assist with eccentric lowering. Patient returning to room in wc, seatbelt alarm on, call light within reach.   ? ?Therapy Documentation ?Precautions:  ?Precautions ?Precautions: Fall ?Precaution Comments: L radial heart cath on 4/5 ?Restrictions ?Weight Bearing Restrictions: No ? ? ?Therapy/Group: Individual Therapy ? ?Westley Foots ?Westley Foots, PT, DPT, CBIS ? ?06/13/2021, 7:40 AM  ?

## 2021-06-13 NOTE — Progress Notes (Signed)
Occupational Therapy Session Note ? ?Patient Details  ?Name: Matthew [name] ?MRN: [id number] ?Date of Birth: [birth year] ? ?Today's Date: 06/13/2021 ?OT Individual Time: 1517-6160 ?OT Individual Time Calculation (min): 75 min  ? ? ?Short Term Goals: ?Week 1:  OT Short Term Goal 1 (Week 1): pt will consistently stand from wc with CGA to RW ?OT Short Term Goal 2 (Week 1): Pt will demonstrate improved perception and motor planning to don a shirt with set up. ?OT Short Term Goal 3 (Week 1): Pt will complete toilet transfer with min A. ?OT Short Term Goal 4 (Week 1): Pt will don pants with mod A. ? ?Skilled Therapeutic Interventions/Progress Updates:  ?  Pt received in wc ready for therapy.  Pt taken to ortho gym to work on ROM, coordination and strength exercises. ? ?Using RW, pt stood from wc with CGA.  Stand pivot to mat with RW with min -mod A due to very impaired ability to bear much weight through LLE due to arthritic knee and ankle.  Severe kyphosis in standing and mod in sitting.   ? ?Sitting EOM, for LE AROM did numerous towel slides on floor and guided AROM. Need to break task down into small steps and pt having difficult coordinating and sequencing.   ? ?For UB, used weighted dowel and weighted ball for various modified exercises as pt has very limited L shoulder range.   ? ?Sit to stand from elevated mat to RW supervision but standing tolerance only 30 seconds.   ? ?Pt asking "how am I doing?"  Explained to him the areas we are focusing on in therapy and our concerns with his LLE and balance.  Discussed having his daughter come in to see him in therapy.   ? ?Pt stood from mat to RW with supervision but then needed mod -max A to stand pivot with RW to wc and then a second time to his bed as his L knee kept buckling.  Pt self propelled wc about 30 feet back to his room.  ? ?Pt then was able to lay down with supervision.  Bed alarm on and all needs set up to prepare pt for lunch.  ? ? ? ?Therapy  Documentation ?Precautions:  ?Precautions ?Precautions: Fall ?Precaution Comments: L radial heart cath on 4/5 ?Restrictions ?Weight Bearing Restrictions: No ? ?  ?Vital Signs: ?Therapy Vitals ?Pulse Rate: 60 ?Resp: 18 ?BP: 102/62 ?Patient Position (if appropriate): Sitting ?Oxygen Therapy ?SpO2: 100 % ?O2 Device: Room Air ?Pain: ?Pain Assessment ?Pain Scale: 0-10 ?Pain Score: 0-No pain ?ADL: ?ADL ?Eating: Set up ?Grooming: Minimal cueing, Minimal assistance ?Upper Body Bathing: Supervision/safety ?Where Assessed-Upper Body Bathing: Wheelchair ?Lower Body Bathing: Moderate assistance ?Where Assessed-Lower Body Bathing: Wheelchair ?Upper Body Dressing: Moderate assistance ?Where Assessed-Upper Body Dressing: Wheelchair ?Lower Body Dressing: Maximal assistance ?Where Assessed-Lower Body Dressing: Wheelchair ?Toileting: Maximal assistance ?Where Assessed-Toileting: Bedside Commode ?Toilet Transfer: Moderate assistance ?Toilet Transfer Method: Stand pivot ? ? ?Therapy/Group: Individual Therapy ? ?Matthew [name] ?06/13/2021, 10:31 AM ?

## 2021-06-14 LAB — URINE CULTURE: Culture: >=100000 — AB

## 2021-06-14 NOTE — Progress Notes (Signed)
Physical Therapy Session Note ? ?Patient Details  ?Name: Matthew [name] ?MRN: [id number] ?Date of Birth: [birth year] ? ?Today's Date: 06/14/2021 ?PT Individual Time: 5176-1607; 3710-6269 ?PT Individual Time Calculation (min): 70 min and 55 mins ? ?Short Term Goals: ?Week 1:  PT Short Term Goal 1 (Week 1): Patient will complete bed mobility with MinA ?PT Short Term Goal 2 (Week 1): Patient will complete sit <> stand with LRAD and ModA consistently ?PT Short Term Goal 3 (Week 1): Patient will ambulate >71ft with LRAD and ModA ? ?Skilled Therapeutic Interventions/Progress Updates:  ?  Session 1: Patient received sitting up in wc, agreeable to PT. He denies pain. PT transporting patient in wc to therapy gym for time management and energy conservation. He transferred to therapy mat via stand pivot with RW and MinA. Blocked practice sit <> stand with RW and CGA, MinA needed from lower mat height. Patient ambulating 22ft with RW and CGA + wc follow due to limited endurance. Patient able to propel himself in wc x167ft with supervision and verbal cues for more efficient strokes. Patient completing 10 mins on Kinetron at 30cm/s. Attempted dual cog + motor task with standing endurance creating peg board based on picture. Patient able to stand for ~2 mins before needing to sit, but was unable to complete dual task. He required TotalA to accurately complete peg board. Patient attempting to complete 2" toe taps to box, but unable to sufficiently shift weight and lift contralateral leg to adequate height. Patient returning to room in wc, seatbelt alarm on, call light within reach.  ? ?Session 2: Patient received sitting up in bed, agreeable to PT. He denies pain. Able to come sit EOB with supervision. ModA stand pivot to wc with RW with noted deteriorating motor planning throughout transfer. PT transporting patient in wc to therapy gym for time management and energy conservation. He transferred to NuStep via stand pivot with RW and  ModA. Patient with progressive R lateral lean in NuStep, even with mirror placed in front of patient for visual feedback on posture. Patient with difficulty coordinating reciprocal movement requiring frequent reminders on how to initiate movement on NuStep throughout 10 min bout. Patient found to have been incontinent of urine and questionably bowel. Returned to room in wc. MinA to stand with RW and maintain standing while 2nd person completed TotalA perihygiene and clothing management in standing. ModA stand pivot to bed. Supervision to return supine. Bed alarm on, call light within reach.  ? ?Therapy Documentation ?Precautions:  ?Precautions ?Precautions: Fall ?Precaution Comments: L radial heart cath on 4/5 ?Restrictions ?Weight Bearing Restrictions: No ? ? ? ? ?Therapy/Group: Individual Therapy ? ?Westley Foots ?Westley Foots, PT, DPT, CBIS ? ?06/14/2021, 7:38 AM  ?

## 2021-06-14 NOTE — Progress Notes (Signed)
?                                                       PROGRESS NOTE ? ? ?Subjective/Complaints: No acute events overnight. Working with PT. No concerns or complaints this AM. ? ?ROS: Negative for chills, fever, dysuria, urgency, or incontinence. No HA. Denies pain. ? ?Objective: ?  ?No results found. ?Recent Labs  ?  06/13/21 ?0614  ?WBC 5.2  ?HGB 11.8*  ?HCT 37.0*  ?PLT 134*  ? ?Recent Labs  ?  06/13/21 ?0614  ?NA 135  ?K 4.9  ?CL 98  ?CO2 26  ?GLUCOSE 84  ?BUN 61*  ?CREATININE 2.38*  ?CALCIUM 9.1  ? ? ?Intake/Output Summary (Last 24 hours) at 06/14/2021 0813 ?Last data filed at 06/14/2021 0300 ?Gross per 24 hour  ?Intake --  ?Output 950 ml  ?Net -950 ml  ?  ? ?Pressure Injury 06/03/21 Sacrum Left Stage 2 -  Partial thickness loss of dermis presenting as a shallow open injury with a red, pink wound bed without slough. (Active)  ?06/03/21 1800  ?Location: Sacrum  ?Location Orientation: Left  ?Staging: Stage 2 -  Partial thickness loss of dermis presenting as a shallow open injury with a red, pink wound bed without slough.  ?Wound Description (Comments):   ?Present on Admission:   ?   ?Pressure Injury 06/08/21 Sacrum Right Stage 2 -  Partial thickness loss of dermis presenting as a shallow open injury with a red, pink wound bed without slough. (Active)  ?06/08/21 1755  ?Location: Sacrum  ?Location Orientation: Right  ?Staging: Stage 2 -  Partial thickness loss of dermis presenting as a shallow open injury with a red, pink wound bed without slough.  ?Wound Description (Comments):   ?Present on Admission: Yes  ? ? ?Physical Exam: ?Vital Signs ?Blood pressure 106/73, pulse 62, temperature 98 ?F (36.7 ?C), resp. rate 16, height 6\' 7"  (2.007 m), weight 82.7 kg, SpO2 100 %. ? ?Constitutional: No distress . Vital signs reviewed. ?HEENT: NCAT, EOMI, oral membranes moist, hard of hearing ?Neck: supple ?Cardiovascular: RRR without murmur. No JVD    ?Respiratory/Chest: CTA Bilaterally without wheezes or rales. Normal effort     ?GI/Abdomen: BS +, non-tender, non-distended, soft ?Neurological: alert and oriented, respond appropriately.  Moving all 4 extremities. Grip strenght 5/5 in b/l UE, 4+/5 elbow flexion and extension ?Ext: no clubbing, cyanosis, or edema ?Psych: pleasant and cooperative , follows commands ? ?Skin: ?Comments: Small skin ulcer on RLE covered in bordered foam dressing, appears to be improving. No  surrounding errythema  ? ?Assessment/Plan: ?1. Functional deficits which require 3+ hours per day of interdisciplinary therapy in a comprehensive inpatient rehab setting. ?Physiatrist is providing close team supervision and 24 hour management of active medical problems listed below. ?Physiatrist and rehab team continue to assess barriers to discharge/monitor patient progress toward functional and medical goals ? ?Care Tool: ? ?Bathing ?   ?Body parts bathed by patient: Chest, Left arm, Right arm, Abdomen, Right upper leg, Left upper leg, Face  ? Body parts bathed by helper: Front perineal area, Buttocks, Right lower leg, Left lower leg ?  ?  ?Bathing assist Assist Level: Moderate Assistance - Patient 50 - 74% ?  ?  ?Upper Body Dressing/Undressing ?Upper body dressing   ?What is the patient wearing?: Pull over shirt ?   ?  Upper body assist Assist Level: Minimal Assistance - Patient > 75% ?   ?Lower Body Dressing/Undressing ?Lower body dressing ? ? ?   ?What is the patient wearing?: Pants ? ?  ? ?Lower body assist Assist for lower body dressing: Moderate Assistance - Patient 50 - 74% ?   ? ?Toileting ?Toileting    ?Toileting assist Assist for toileting: Total Assistance - Patient < 25% ?  ?  ?Transfers ?Chair/bed transfer ? ?Transfers assist ?   ? ?Chair/bed transfer assist level: Minimal Assistance - Patient > 75% ?  ?  ?Locomotion ?Ambulation ? ? ?Ambulation assist ? ?   ? ?Assist level: Minimal Assistance - Patient > 75% ?Assistive device: Lozito-rolling ?Max distance: 23 feet  ? ?Walk 10 feet activity ? ? ?Assist ? Walk 10  feet activity did not occur: Safety/medical concerns ? ?Assist level: Minimal Assistance - Patient > 75% ?Assistive device: Wieber-rolling  ? ?Walk 50 feet activity ? ? ?Assist Walk 50 feet with 2 turns activity did not occur: Safety/medical concerns ? ?  ?   ? ? ?Walk 150 feet activity ? ? ?Assist Walk 150 feet activity did not occur: Safety/medical concerns ? ?  ?  ?  ? ?Walk 10 feet on uneven surface  ?activity ? ? ?Assist Walk 10 feet on uneven surfaces activity did not occur: Safety/medical concerns ? ? ?  ?   ? ?Wheelchair ? ? ? ? ?Assist Is the patient using a wheelchair?: Yes ?Type of Wheelchair: Manual ?  ? ?Wheelchair assist level: Minimal Assistance - Patient > 75% ?Max wheelchair distance: 13  ? ? ?Wheelchair 50 feet with 2 turns activity ? ? ? ?Assist ? ?  ?  ? ? ?Assist Level: Minimal Assistance - Patient > 75%  ? ?Wheelchair 150 feet activity  ? ? ? ?Assist ?   ? ? ?Assist Level: Moderate Assistance - Patient 50 - 74%  ? ?Blood pressure 106/73, pulse 62, temperature 98 ?F (36.7 ?C), resp. rate 16, height 6\' 7"  (2.007 m), weight 82.7 kg, SpO2 100 %. ? ?Medical Problem List and Plan: ?1. Functional deficits secondary to heart failure/debility ?            -patient may shower ?            -ELOS/Goals: S 10-14 days ?            -Con't CIR- PT and OT ?2.  Antithrombotics: ?-DVT/anticoagulation:  Pharmaceutical: Other (comment)Eliquis 5 mg BID ?            -antiplatelet therapy: none ?3. Pain Management: Tylenol as needed ?            4/27- denies pain- con't regimen ?4. Mood: LCSW to evaluate and provide emotional support ?            -antipsychotic agents: n/a ?5. Neuropsych: This patient is capable of making decisions on his own behalf. ? -5/1 Pt reports good mood overall ?6. Skin/Wound Care: Routine skin care checks ?            -- continue local wound care to RLE with Medihoney ?            -4/30 Continue MediHoney, site covered by foam pad. ? -5/2 wound appears to be improving ?7.  Fluids/Electrolytes/Nutrition: Strict Is and Os and follow-up chemistries + mag in AM ?            --per HF team>>K+ greater than 4 and mag greater than 2 ?  4/27- K+ 4.6- and Mg 2.1- con't regimen ?            -4/30 qMonday labs to trend ? -5/1 K+ 4.9, continue to monitor ?8: Nonischemic cardiomyopathy ?9: Acute on chronic biventricular heart failure (off Toprol Xl due to soft BP) ?            --daily weight ?            --continue torsemide 40 mg daily ?-- continue midodrine 15 mg 3 times daily ?-- continue amiodarone 400 mg twice daily ?--mexilitine 300 mg BID ?-- Mag-Ox 400 mg daily ?-5/2 Daily weight appears stable overall ?10: Valvular heart disease: s/p TEER MVR, still with 3+ MR ?--tricuspid regurg>>moderate on TEE ?11: Acute pulmonary embolism: ?--continue Eliquis 5 mg twice daily ?12: Acute kidney injury atop chronic kidney disease stage IIIb; serum Cr is baseline 1.4-1.7 ?            --current Cr 2.1>>follow-up BMP ?--off spironolactone ?4/27- Cr stable today at 2.11 and BUN 54- is worse than baseline- however don't want to give IVFs right now due to CHF exacerbation just got over- will encourage him to drink and monitor ?            4/28 will order repeat labs for Monday ?           -4/30 qMonday labs to trend ?13: Dyslipidemia: ?-- continue Lipitor 10 mg daily ?14: Ascending aortic aneurysm ?-- follow-up CT surgery as outpatient ?15: CAD: Left heart cath with mild nonobstructive CAD, continue statin ?16: NSVT/PVCs: Goal to keep potassium greater than 4 and magnesium greater than 2 ?--heart meds as above #9 ?--not ablation candidate ?17: Right lower extremity wound: Local wound care ?            -- Bilateral ABIs noncompressible ?             -Continue medihoney and bordered foam dressing ?18: Hyponatremia: serum sodium 134; limit free water>>follow-up BMP ?4/27- Na 135- con't to monitor ?5/1- NA 135 continue to monitor ?19: Hyperkalemia currently resolved>>follow-up BMP ?            --keep K+  greater than 4 ?            -4/27 K+ is 4.6, continue to monitor ?            -4/30 qMonday labs to trend ? -5/1 K+ Wnl  ?20. Using condom catheter- con't at night if need be.  ?21. Gastroesophageal Reflux ?            -PRN

## 2021-06-14 NOTE — Progress Notes (Signed)
Occupational Therapy Session Note ? ?Patient Details  ?Name: Matthew [name] ?MRN: [id number] ?Date of Birth: 1940/11/20 ? ?Today's Date: 06/14/2021 ?OT Individual Time: 7096-4383 ?OT Individual Time Calculation (min): 60 min  ? ? ?Short Term Goals: ?Week 1:  OT Short Term Goal 1 (Week 1): pt will consistently stand from wc with CGA to RW ?OT Short Term Goal 2 (Week 1): Pt will demonstrate improved perception and motor planning to don a shirt with set up. ?OT Short Term Goal 3 (Week 1): Pt will complete toilet transfer with min A. ?OT Short Term Goal 4 (Week 1): Pt will don pants with mod A. ? ?Skilled Therapeutic Interventions/Progress Updates:  ?Pt received in w/c complaining of "tailbone" pain as he had been sitting for awhile.  Offered pt to work on exercises from bed level so he could have pressure relief. Pt agreed.   ?Set up w/c so pt could use RW to transfer to bed. ?Pt initially reached hands for RW and cued pt to reach back and push up from w/c.  Pt put his L hand on R side of w/c arm rest and asked me "you mean like this?"  ?Corrected pt and and showed him where to put his hands and then he stood up with SUPERVISION.  He had much better control of his L leg and was able to stand pivot to bed with RW with supervision.  ? ?Pt able to lay supine with S.  From flat position, pt worked on bridging, torso twists, alternating lay extensions.  Self ROM for LUE to 90 degrees flexion.  ? ?Pt positioned upright in bed to continue to work on LE and UE AROM exercises.  Pt participated well.  Pt resting in bed with all needs met. Alarm set.   ? ?Therapy Documentation ?Precautions:  ?Precautions ?Precautions: Fall ?Precaution Comments: L radial heart cath on 4/5 ?Restrictions ?Weight Bearing Restrictions: No ?  ?Pain: ?Pain Assessment ?Pain Scale: 0-10 ?Pain Score: 0-No pain ?ADL: ?ADL ?Eating: Set up ?Grooming: Minimal cueing, Minimal assistance ?Upper Body Bathing: Supervision/safety ?Where Assessed-Upper Body Bathing:  Wheelchair ?Lower Body Bathing: Moderate assistance ?Where Assessed-Lower Body Bathing: Wheelchair ?Upper Body Dressing: Moderate assistance ?Where Assessed-Upper Body Dressing: Wheelchair ?Lower Body Dressing: Maximal assistance ?Where Assessed-Lower Body Dressing: Wheelchair ?Toileting: Maximal assistance ?Where Assessed-Toileting: Bedside Commode ?Toilet Transfer: Moderate assistance ?Toilet Transfer Method: Stand pivot ? ?Therapy/Group: Individual Therapy ? ?[place] ?06/14/2021, 8:28 AM ?

## 2021-06-14 NOTE — Progress Notes (Signed)
Physical Therapy Session Note ? ?Patient Details  ?Name: Matthew [name] ?MRN: [id number] ?Date of Birth: [birth year] ? ?Today's Date: 06/14/2021 ?PT Individual Time: 8182-9937 ?PT Individual Time Calculation (min): 45 min  ? ?Short Term Goals: ?Week 1:  PT Short Term Goal 1 (Week 1): Patient will complete bed mobility with MinA ?PT Short Term Goal 2 (Week 1): Patient will complete sit <> stand with LRAD and ModA consistently ?PT Short Term Goal 3 (Week 1): Patient will ambulate >73ft with LRAD and ModA ? ?Skilled Therapeutic Interventions/Progress Updates:  ?Patient supine in bed on entrance to room. Patient alert and agreeable to PT session.  ? ?Patient with no pain complaint throughout session. ? ?Therapeutic Activity: ?Bed Mobility: Patient performed supine <> sit with supervision and use of bed features. VC/ tc required for effort, min technique. Pt performs dressing for LB at bedside and requires MinA to reach feet and ModA to ensure correct LE threading. Max A to don shoes.  ?Transfers: Patient performed sit<>stand and stand pivot transfers throughout session from elevated bed height with close supervision and from standard seat heights with CGA. Provided verbal cues for hand placement for improved push-to-stand technique, effort. To complete full turn to sit in w/c, pt requires extensive cueing for direction of turn, completion of turn, [place] mgmt,  ? ?Gait Training:  ?Patient ambulated in room distances using RW with flexed posture and CGA. Demonstrated slow pace. Provided vc/ tc for maintaining adequate step height/ length. ? ?Patient seated upright  in w/c at end of session with brakes locked, belt alarm set, and all needs within reach. Pt oriented to time and time of next therapy session in 30 min.  ? ? ?Therapy Documentation ?Precautions:  ?Precautions ?Precautions: Fall ?Precaution Comments: L radial heart cath on 4/5 ?Restrictions ?Weight Bearing Restrictions: No ?General: ?  ?Vital Signs: ?   ?Pain: ?Pain Assessment ?Pain Scale: 0-10 ?Pain Score: 0-No pain ?Mobility: ?  ?Locomotion : ?   ?Trunk/Postural Assessment : ?   ?Balance: ?  ?Exercises: ?  ?Other Treatments:   ? ? ? ?Therapy/Group: Individual Therapy ? ?Loel Dubonnet PT, DPT ?06/14/2021, 10:33 AM  ?

## 2021-06-14 NOTE — Plan of Care (Signed)
?  Problem: Consults ?Goal: RH GENERAL PATIENT EDUCATION ?Description: See Patient Education module for education specifics. ?Outcome: Progressing ?  ?Problem: RH BOWEL ELIMINATION ?Goal: RH STG MANAGE BOWEL WITH ASSISTANCE ?Description: STG Manage Bowel with mod I Assistance. ?Outcome: Progressing ?  ?Problem: RH BLADDER ELIMINATION ?Goal: RH STG MANAGE BLADDER WITH ASSISTANCE ?Description: STG Manage Bladder With toileting Assistance ?Outcome: Progressing ?  ?Problem: RH SKIN INTEGRITY ?Goal: RH STG MAINTAIN SKIN INTEGRITY WITH ASSISTANCE ?Description: STG Maintain Skin Integrity With min Assistance. ?Outcome: Progressing ?Goal: RH STG ABLE TO PERFORM INCISION/WOUND CARE W/ASSISTANCE ?Description: STG Able To Perform Incision/Wound Care With min Assistance. ?Outcome: Progressing ?  ?Problem: RH SAFETY ?Goal: RH STG ADHERE TO SAFETY PRECAUTIONS W/ASSISTANCE/DEVICE ?Description: STG Adhere to Safety Precautions With cues Assistance/Device. ?Outcome: Progressing ?  ?Problem: RH KNOWLEDGE DEFICIT GENERAL ?Goal: RH STG INCREASE KNOWLEDGE OF SELF CARE AFTER HOSPITALIZATION ?Description: Patient and family will be able to manage care at discharge using handouts and educational tools independently ?Outcome: Progressing ?  ?

## 2021-06-15 DIAGNOSIS — N179 Acute kidney failure, unspecified: Secondary | ICD-10-CM

## 2021-06-15 LAB — BASIC METABOLIC PANEL
Anion gap: 9 (ref 5–15)
BUN: 62 mg/dL — ABNORMAL HIGH (ref 8–23)
CO2: 28 mmol/L (ref 22–32)
Calcium: 9.2 mg/dL (ref 8.9–10.3)
Chloride: 97 mmol/L — ABNORMAL LOW (ref 98–111)
Creatinine, Ser: 2.45 mg/dL — ABNORMAL HIGH (ref 0.61–1.24)
GFR, Estimated: 26 mL/min — ABNORMAL LOW (ref >60)
Glucose, Bld: 93 mg/dL (ref 70–99)
Potassium: 5.2 mmol/L — ABNORMAL HIGH (ref 3.5–5.1)
Sodium: 134 mmol/L — ABNORMAL LOW (ref 135–145)

## 2021-06-15 LAB — CBC
HCT: 38.6 % — ABNORMAL LOW (ref 39.0–52.0)
Hemoglobin: 12.2 g/dL — ABNORMAL LOW (ref 13.0–17.0)
MCH: 25.8 pg — ABNORMAL LOW (ref 26.0–34.0)
MCHC: 31.6 g/dL (ref 30.0–36.0)
MCV: 81.8 fL (ref 80.0–100.0)
Platelets: 159 10*3/uL (ref 150–400)
RBC: 4.72 MIL/uL (ref 4.22–5.81)
RDW: 20.7 % — ABNORMAL HIGH (ref 11.5–15.5)
WBC: 5.7 10*3/uL (ref 4.0–10.5)
nRBC: 0 % (ref 0.0–0.2)

## 2021-06-15 LAB — MAGNESIUM: Magnesium: 2.2 mg/dL (ref 1.7–2.4)

## 2021-06-15 MED ORDER — TORSEMIDE 20 MG PO TABS
20.0000 mg | ORAL_TABLET | Freq: Every day | ORAL | Status: DC
  Administered 2021-06-17 – 2021-06-19 (×3): 20 mg via ORAL
  Filled 2021-06-15 (×3): qty 1

## 2021-06-15 MED ORDER — AMOXICILLIN-POT CLAVULANATE 500-125 MG PO TABS
1.0000 | ORAL_TABLET | Freq: Two times a day (BID) | ORAL | Status: AC
  Administered 2021-06-15 – 2021-06-21 (×14): 500 mg via ORAL
  Filled 2021-06-15 (×15): qty 1

## 2021-06-15 MED ORDER — SODIUM ZIRCONIUM CYCLOSILICATE 5 G PO PACK
5.0000 g | PACK | Freq: Once | ORAL | Status: AC
  Administered 2021-06-15: 5 g via ORAL
  Filled 2021-06-15: qty 1

## 2021-06-15 NOTE — Progress Notes (Signed)
Occupational Therapy Session Note ? ?Patient Details  ?Name: Matthew [name] ?MRN: [id number] ?Date of Birth: January 05, 1941 ? ?Today's Date: 06/15/2021 ?OT Individual Time: 6579-0383 ?OT Individual Time Calculation (min): 41 min  ? ? ?Short Term Goals: ?Week 1:  OT Short Term Goal 1 (Week 1): pt will consistently stand from wc with CGA to RW ?OT Short Term Goal 2 (Week 1): Pt will demonstrate improved perception and motor planning to don a shirt with set up. ?OT Short Term Goal 3 (Week 1): Pt will complete toilet transfer with min A. ?OT Short Term Goal 4 (Week 1): Pt will don pants with mod A. ? ?Skilled Therapeutic Interventions/Progress Updates:  ?  Pt received seated in w/c, no c/o pain, agreeable to therapy. Session focus on self-care retraining, activity tolerance, transfer retraining in prep for improved ADL/IADL/func mobility performance + decreased caregiver burden. ? ?Light min A x2 to power up from low recliner height, CGA ambulatory TTB transfer with RW and cues for technique. Bathed full-body with LH sponge and min A for bathing buttocks upon exit. Pt able to don shorts with min A to pull up over L hip and cues for technique/safe UE support on RW. Mod A to don shirt due to difficulty threading head and LUE. Total A to don/doff B socks due to time constraints. ? ?Pt left seated in recliner with safety belt alarm engaged, call bell in reach, and all immediate needs met.  ? ? ?Therapy Documentation ?Precautions:  ?Precautions ?Precautions: Fall ?Precaution Comments: L radial heart cath on 4/5 ?Restrictions ?Weight Bearing Restrictions: No ? ?Pain: no c/o throughout ?  ?ADL: See Care Tool for more details. ? ? ?Therapy/Group: Individual Therapy ? ?Volanda Napoleon MS, OTR/L ? ?06/15/2021, 7:02 AM ?

## 2021-06-15 NOTE — Progress Notes (Addendum)
? ? Advanced Heart Failure Rounding Note ? ?PCP-Cardiologist: Larae Grooms, MD  ? ?Subjective:   ? ?Discharged to CIR 4/26 ? ?Making progress w/ rehab. Feels he is getting stronger. Hoping he can go home next week.  ? ?We were asked to see today given rise in SCr and elevated K ? ?SCr 2.1>>2.38>>2.45. K 4.9>>5.2.  ? ?Pt endorses decent PO intake, but looks dry on exam.  ? ?Denies dyspnea. No CP.  ? ?Objective:   ?Weight Range: ?82.7 kg ?Body mass index is 20.54 kg/m?.  ? ?Vital Signs:   ?Temp:  [98.1 ?F (36.7 ?C)-98.2 ?F (36.8 ?C)] 98.2 ?F (36.8 ?C) (05/03 0701) ?Pulse Rate:  [60-61] 60 (05/03 1420) ?Resp:  [17-18] 17 (05/03 1420) ?BP: (103-110)/(62-70) 110/69 (05/03 1420) ?SpO2:  [100 %] 100 % (05/03 1420) ?Last BM Date : 06/14/21 ? ?Weight change: ?Filed Weights  ? 06/12/21 0348 06/13/21 0339 06/14/21 0500  ?Weight: 83.2 kg 81.7 kg 82.7 kg  ? ? ?Intake/Output:  ? ?Intake/Output Summary (Last 24 hours) at 06/15/2021 1428 ?Last data filed at 06/15/2021 1200 ?Gross per 24 hour  ?Intake 755 ml  ?Output 1000 ml  ?Net -245 ml  ?  ? ? ?Physical Exam  ?  ?General:  elderly male sitting up in chair. No distress. ?HEENT: Normal ?Neck: Supple. JVD not elevated. Carotids 2+ bilat; no bruits.  ?Cor: PMI nondisplaced. Regular rate & rhythm. No rubs, gallops, 2/6 MR ?Lungs: CTAB, no wheezing  ?Abdomen: Soft, nontender, nondistended. No hepatosplenomegaly.  ?Extremities: No cyanosis, clubbing, rash, edema ?Neuro: Alert & orientedx3, cranial nerves grossly intact. moves all 4 extremities w/o difficulty. Affect pleasant ? ? ?Telemetry  ? ?N/A  ? ?Labs  ?  ?CBC ?Recent Labs  ?  06/13/21 ?0614 06/15/21 ?[id number]  ?WBC 5.2 5.7  ?NEUTROABS 3.2  --   ?HGB 11.8* 12.2*  ?HCT 37.0* 38.6*  ?MCV 81.1 81.8  ?PLT 134* 159  ? ?Basic Metabolic Panel ?Recent Labs  ?  06/13/21 ?0614 06/15/21 ?[id number]  ?NA 135 134*  ?K 4.9 5.2*  ?CL 98 97*  ?CO2 26 28  ?GLUCOSE 84 93  ?BUN 61* 62*  ?CREATININE 2.38* 2.45*  ?CALCIUM 9.1 9.2  ?MG 2.1 2.2  ? ?Liver  Function Tests ?No results for input(s): AST, ALT, ALKPHOS, BILITOT, PROT, ALBUMIN in the last 72 hours. ? ?No results for input(s): LIPASE, AMYLASE in the last 72 hours. ?Cardiac Enzymes ?No results for input(s): CKTOTAL, CKMB, CKMBINDEX, TROPONINI in the last 72 hours. ? ?BNP: ?BNP (last 3 results) ?Recent Labs  ?  05/09/21 ?[id number] 05/10/21 ?0205 05/13/21 ?2142  ?BNP >4,500.0* >4,500.0* >4,500.0*  ? ? ?ProBNP (last 3 results) ?No results for input(s): PROBNP in the last 8760 hours. ? ? ?D-Dimer ?No results for input(s): DDIMER in the last 72 hours. ?Hemoglobin A1C ?No results for input(s): HGBA1C in the last 72 hours. ?Fasting Lipid Panel ?No results for input(s): CHOL, HDL, LDLCALC, TRIG, CHOLHDL, LDLDIRECT in the last 72 hours. ?Thyroid Function Tests ?No results for input(s): TSH, T4TOTAL, T3FREE, THYROIDAB in the last 72 hours. ? ?Invalid input(s): FREET3 ? ?Other results: ? ? ?Imaging  ? ? ?No results found. ? ? ?Medications:   ? ? ?Scheduled Medications: ? amiodarone  400 mg Oral BID  ? amoxicillin-clavulanate  1 tablet Oral BID  ? apixaban  5 mg Oral BID  ? atorvastatin  10 mg Oral Daily  ? feeding supplement  1 Container Oral TID BM  ? leptospermum manuka honey  1 application. Topical Daily  ?  magnesium oxide  400 mg Oral Daily  ? mexiletine  300 mg Oral Q12H  ? midodrine  15 mg Oral TID WC  ? pantoprazole  40 mg Oral Daily  ? sodium zirconium cyclosilicate  5 g Oral Once  ? torsemide  40 mg Oral Daily  ? ? ?Infusions: ? ? ?PRN Medications: ?acetaminophen, alum & mag hydroxide-simeth, diphenhydrAMINE, guaiFENesin-dextromethorphan, methocarbamol, prochlorperazine **OR** prochlorperazine **OR** prochlorperazine, senna-docusate, sodium phosphate, sorbitol, traMADol, traZODone ? ? ? ?Patient Profile  ? ?81 y.o. male with history of chronic biventricular HF, NICM, CAD, severe MR/TR, HTN, obesity and multiple recent admissions,  admitted w/ acute PE, a/c CHF and frequent PVCs.  ?  ?Echo, EF 25%, mildly dilated RV  with moderately decreased systolic function, severe MR and TR. IVC dilated ?  ?Underwent MitraClip 4/20.  ?Discharged to CIR 4/26  ? ?Assessment/Plan  ? ?1. Chronic Biventricular HF   ?- Recent admit for a/c CHF requiring IV Lasix and milrinone  ?- ECHO 04/2020 EF 25-30% with mild MR.   ?- Echo 04/04/21 EF LVEF 20-25%, RV moderately reduced, LA/RA severely dilated, and severe MR.  ?- Echo per Dr. 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 Not candidate for PVC ablation. S/P MTEER but still with 3+ MR.  ?- stable off milrinone. Appears dry on exam.  ?- hold torsemide tomorrow AM given bump in SCr. Repeat BMP in AM ?- plan restart torsemide at lower dose, 20 mg daily on 5/5 if SCr improved/stable   ?- GDMT limited d/t hypotension and AKI. Continue midodrine 15 mg TID with soft BP to help w/ renal perfusion  ?- Off Toprol XL with soft BP.   ?- Off Spiro w/ elevated K and SCr ?  ?2. CAD ?- LHC w/ mild non-obstructive CAD ?- denies CP ?- continue statin ?- no ASA w/ Eliquis ?- off ? blocker w/ soft BP  ?  ?3. Mitral Regurgitation ?- Severe on echo 02/23 ?- TEE showed mild restriction of P2. Severe central/posterior MR due to P2 restriction and annular dilation ?- functional, severely dilated LA and LV  ?- S/P MTEER => still with 3+ MR.  ?  ?4. Tricuspid Regurgitation  ?- moderate on TEE  ?- functional, severely dilated RA and RV   ?- HF optimization per above  ?  ?5. Acute PE ?- On Eliquis.  ?- No evidence of DVT on [place] ?- ? 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  ?6. Hyperlipidemia ?- Continue statin ?  ?8.  AKI on CKD IIIa ?- Previous Scr  baseline 1.4-1.7. Last few months ~ 2.1  ?- Suspect cardiorenal syndrome. ?- SCr trending back up, 2.1>>2.4 ?- Hold torsemide tomorrow, restart at lower dose on 5/5 ?- repeat BMP tomorrow  ?  ?9. NSVT/PVCs ?- recent high burden, suspect contributing to CM  ?- Slightly improved post MTEER ?- continue mexiletine 300 mg BID  ?- continue amiodarone 400 mg bid for now but will ultimately need dose reduction  ?- Keep K > 4 and Mag > 2 ?- per EP, he is not an ablation candidate ?  ?  10. Debility ?- c/w therapy ?- appreciate CIR's assistance  ? ?11. Hyperkalemia ?- K 5.2 in setting of AKI ?- Lokelma 5 mg x 1 ?- repeat BMP in AM  ? ? ? ?Length of Stay: 7 ? ?Lyda Jester, PA-C  ?06/15/2021, 2:28 PM ? ?Advanced Heart Failure Team ?Pager [phone] (M-F; 7a - 5p)  ?Please contact [facility] for night-coverage after hours (5p -7a ) and weekends on [email or web address] ?  ?Patient seen and examined with the above-signed Advanced Practice Provider and/or Housestaff. I personally reviewed laboratory data, imaging studies and relevant notes. I independently examined the patient and formulated the important aspects of the plan. I have edited the note to reflect any of my changes or salient points. I have personally discussed the plan with the patient and/or family. ? ?He looks stronger. Denies CP or SOB. SCr and K rising. Bicarb elevated. Weight unchanged ? ?General:  Sitting in chair  No resp difficulty ?HEENT: normal ?Neck: supple. no JVD. Carotids 2+ bilat; no bruits. No lymphadenopathy or thryomegaly appreciated. ?Cor: PMI nondisplaced. Regular rate & rhythm. 2/6 mR ?Lungs: clear ?Abdomen: soft, nontender, nondistended. No hepatosplenomegaly. No bruits or masses. Good bowel sounds. ?Extremities: no cyanosis, clubbing, rash, edema ?Neuro: alert & orientedx3, cranial nerves grossly intact. moves all 4 extremities w/o difficulty. Affect pleasant ? ?Agree with holding torsemide and following labs. If Scr  up again tomorrow can give IVF back +/- lokelma as needed.  ? ?Glori Bickers, MD  ?7:27 PM ? ? ?

## 2021-06-15 NOTE — Progress Notes (Addendum)
Patient ID: Matthew [name], male   DOB: 02-Dec-1940, 81 y.o.   MRN: [id number]  Met with pt and spoke with daughter via telephone to update regarding team conference of supervision-CGA level and discharge ate of 5/10. Daughter asked so he does need 24/7 supervision and answered yes. She voiced what will she do if she needed to leave town., told her she would need someone to stay with dad at home. Between she and her brother they could hire assist. Scheduled for her to come in for education on Sunday at 1;00-3:)0 this way she would not need to miss work and will take off Wed to take pt home. Daughter thought he would be back to his prior function before being released for the hospital, but will need someone with him at home. Discussed will have home health follow to continue his progress once discharged.  ?

## 2021-06-15 NOTE — Patient Care Conference (Signed)
Inpatient RehabilitationTeam Conference and Plan of Care Update ?Date: 06/15/2021   Time: 12:17 PM  ? ? ?Patient Name: Matthew [name]      ?Medical Record Number: [id number]  ?Date of Birth: 07-Jun-1940 ?Sex: Male         ?Room/Bed: 5C18C/5C18C-01 ?Payor Info: Payor: MEDICARE / Plan: MEDICARE PART A AND B / Product Type: *No Product type* /   ? ?Admit Date/Time:  06/08/2021  5:25 PM ? ?Primary Diagnosis:  Heart failure ([place]) ? ?Hospital Problems: Principal Problem: ?  Heart failure ([place]) ?Active Problems: ?  Debility ?  Open wound of right lower extremity ? ? ? ?Expected Discharge Date: Expected Discharge Date: 06/22/21 ? ?Team Members Present: ?Physician leading conference:  (Dr. Jennye Boroughs) ?Social Worker Present: Ovidio Kin, LCSW ?Nurse Present: Dorien Chihuahua, RN ?PT Present: Estevan Ryder, PT ?OT Present: Meriel Pica, OT ?PPS Coordinator present : Gunnar Fusi, SLP ? ?   Current Status/Progress Goal Weekly Team Focus  ?Bowel/Bladder ? ?   Incontinent of bladder; UTI treated.    Continent   Toileting protocol  ?Swallow/Nutrition/ Hydration ? ?           ?ADL's ? ? supervision with sit to stand, CGA with RW for ambulation short distances, mod-max dressing and bathing due to apraxia and limited PROM  min A for transfers, toileting, LB dressing and bathing  ADL training, functional mobility, pt and family eduation   ?Mobility ? ? spv bed mob, CGA STS and stand pivot with RW, spv wc mob, amb 55ft with RW and CGA + wc follow, remains with profound kyphosis and B knee/hip flexion in stance, motor planning/apraxia deficits apparent as well  CGA  balanc,e endurance, dc planning, pt/fam ed, gait progressions, transfers, wc mob   ?Communication ? ?           ?Safety/Cognition/ Behavioral Observations ?           ?Pain ? ?   N/A        ?Skin ? ?   Stage 2 sacrum, wound on right shin   Skin healing   Wound care right shin w medihoney and foam, wound care to sacrum  ? ? ?Discharge Planning:  ?Going to his home where  his daughter and son in-law live also. Daughter works from home, pt will need 24/7 care will need for her to come in for education   ?Team Discussion: ?Patient treated for UTI and wounds. Confusion and motor function impairments do not correlate with the heart failure diagnosis but may be attributed to the UTI or hospital encephalopathy. Heartburn addressed and monitoring labs.  ?Patient on target to meet rehab goals: ?yes, currently needs supervision for sit- stand and CGA for ambulation. Sketchy posture premorbid with kyphosis and contractures in knees and hips. Goals for discharge set for CGA - min assist. ? ?*See Care Plan and progress notes for long and short-term goals.  ? ?Revisions to Treatment Plan:  ?N/A ?  ?Teaching Needs: ?Safety, needs explicit cues for activities, transfers, toileting, medications, secondary risk management and dietary modifications  ?Current Barriers to Discharge: ?Decreased caregiver support and Home enviroment access/layout ? ?Possible Resolutions to Barriers: ?Family education ?HH follow up services ?DME: W/C, RW ?  ? ? Medical Summary ?Current Status: Functional deficits secondary to heart failure/debility, AKI on CKD, RLE wound, GERD,UTI ? Barriers to Discharge: Other (comments);Wound care;Home enviroment access/layout ? Barriers to Discharge Comments: Functional deficits secondary to heart failure/debility, AKI on CKD, RLE wound, GERD,UTI ?Possible Resolutions  to Barriers/Weekly Focus: Continue wound care, Continue monitoring lytes, UTI treatment ? ? ?Continued Need for Acute Rehabilitation Level of Care: The patient requires daily medical management by a physician with specialized training in physical medicine and rehabilitation for the following reasons: ?Direction of a multidisciplinary physical rehabilitation program to maximize functional independence : Yes ?Medical management of patient stability for increased activity during participation in an intensive rehabilitation  regime.: Yes ?Analysis of laboratory values and/or radiology reports with any subsequent need for medication adjustment and/or medical intervention. : Yes ? ? ?I attest that I was present, lead the team conference, and concur with the assessment and plan of the team. ? ? ?Dorien Chihuahua B ?06/15/2021, 2:06 PM  ? ? ? ? ? ? ?

## 2021-06-15 NOTE — Progress Notes (Signed)
Physical Therapy Session Note ? ?Patient Details  ?Name: Matthew [name] ?MRN: [id number] ?Date of Birth: 27-Jul-1940 ? ?Today's Date: 06/15/2021 ?PT Individual Time: 0900-1010 ?PT Individual Time Calculation (min): 70 min  ? ?Short Term Goals: ?Week 1:  PT Short Term Goal 1 (Week 1): Patient will complete bed mobility with MinA ?PT Short Term Goal 2 (Week 1): Patient will complete sit <> stand with LRAD and ModA consistently ?PT Short Term Goal 3 (Week 1): Patient will ambulate >59ft with LRAD and ModA ? ?Skilled Therapeutic Interventions/Progress Updates:  ?  Pt received seated in bed, agreeable to PT session. No complaints of pain this AM. Seated in bed to sitting EOB with CGA. Pt is able to change his shirt while seated EOB with setup A. Sit to stand with CGA to RW during session. Pt is max A to don pants, dependent to don shoes for time conservation. Stand pivot transfers with CGA and RW during session. Pt taken to therapy gym dependently via w/c for time and energy conservation. Ambulation x 30 ft, x 70 ft with RW and CGA for balance. Pt exhibits significantly flexed trunk and flexed knees with crouched posture during gait. Pt also exhibits narrow BOS with shoes rubbing against each other during gait. Ambulation 2 x 60 ft with use of wooden board between feet to widen BOS, use of RW and CGA for balance. Pt exhibits improved BOS with use of wooden board. Pt also exhibits some impulsivity and attempts to sit several times before alerting therapist and without having a chair behind him. Forward/retro gait in // bars with use of wooden board between LE to encourage wider BOS, 2 x 10 ft each direction with CGA for balance. Pt fatigues very quickly during activity and requires seated rest break between each bout. Pt also exhibits increased difficulty maintaining wider BOS with retropulsive gait. Pt agreeable to remain seated in recliner at end of session, needs in reach, quick release belt and chair alarm in  place. ? ?Therapy Documentation ?Precautions:  ?Precautions ?Precautions: Fall ?Precaution Comments: L radial heart cath on 4/5 ?Restrictions ?Weight Bearing Restrictions: No ? ? ? ? ? ? ?Therapy/Group: Individual Therapy ? ? ?Peter Congo, PT, DPT, CSRS ?06/15/2021, 3:50 PM  ?

## 2021-06-15 NOTE — Plan of Care (Signed)
?  Problem: Consults ?Goal: RH GENERAL PATIENT EDUCATION ?Description: See Patient Education module for education specifics. ?Outcome: Progressing ?  ?Problem: RH BOWEL ELIMINATION ?Goal: RH STG MANAGE BOWEL WITH ASSISTANCE ?Description: STG Manage Bowel with mod I Assistance. ?Outcome: Progressing ?  ?Problem: RH BLADDER ELIMINATION ?Goal: RH STG MANAGE BLADDER WITH ASSISTANCE ?Description: STG Manage Bladder With toileting Assistance ?Outcome: Progressing ?  ?Problem: RH SKIN INTEGRITY ?Goal: RH STG MAINTAIN SKIN INTEGRITY WITH ASSISTANCE ?Description: STG Maintain Skin Integrity With min Assistance. ?Outcome: Progressing ?Goal: RH STG ABLE TO PERFORM INCISION/WOUND CARE W/ASSISTANCE ?Description: STG Able To Perform Incision/Wound Care With min Assistance. ?Outcome: Progressing ?  ?Problem: RH SAFETY ?Goal: RH STG ADHERE TO SAFETY PRECAUTIONS W/ASSISTANCE/DEVICE ?Description: STG Adhere to Safety Precautions With cues Assistance/Device. ?Outcome: Progressing ?  ?Problem: RH KNOWLEDGE DEFICIT GENERAL ?Goal: RH STG INCREASE KNOWLEDGE OF SELF CARE AFTER HOSPITALIZATION ?Description: Patient and family will be able to manage care at discharge using handouts and educational tools independently ?Outcome: Progressing ?  ?

## 2021-06-15 NOTE — Progress Notes (Signed)
Physical Therapy Session Note ? ?Patient Details  ?Name: Matthew [name] ?MRN: [id number] ?Date of Birth: 12-28-1940 ? ?Today's Date: 06/15/2021 ?PT Individual Time: 1135-1200 ? ?Short Term Goals: ?Week 1:  PT Short Term Goal 1 (Week 1): Patient will complete bed mobility with MinA ?PT Short Term Goal 2 (Week 1): Patient will complete sit <> stand with LRAD and ModA consistently ?PT Short Term Goal 3 (Week 1): Patient will ambulate >51f with LRAD and ModA ? ? ?Skilled Therapeutic Interventions/Progress Updates:  ?   ?Pt received sitting in recliner and agreeable to PT. [facility]. [facility] pivot transfer to and from nustep with RW and min assist for safety. Nustep reciprocal strengthening level 6 x 8 min with cues for full ROM and proper foot placement on BLE. Patient returned to room and performed stand pivot to recliner with RW and min assist for safety [facility] once on [facility]. Pt left sitting in recliner with call bell in reach and all needs met.   ? ? ?Therapy Documentation ?Precautions:  ?Precautions ?Precautions: Fall ?Precaution Comments: L radial heart cath on 4/5 ?Restrictions ?Weight Bearing Restrictions: No ? ?  ?Vital Signs: ?Therapy Vitals ?Temp: 98.2 ?F (36.8 ?C) ?Temp Source: Oral ?Pulse Rate: 61 ?Resp: 18 ?BP: 107/70 ?Patient Position (if appropriate): Lying ?Oxygen Therapy ?SpO2: 100 % ?O2 Device: Room Air ?Pain: ?Pain Assessment ?Pain Scale: 0-10 ?Pain Score: 0-No pain ? ? ?Therapy/Group: Individual Therapy ? ?ALorie Phenix?06/15/2021, 8:43 AM  ?

## 2021-06-15 NOTE — Progress Notes (Signed)
Occupational Therapy Session Note ? ?Patient Details  ?Name: Matthew [name] ?MRN: [id number] ?Date of Birth: 1940/11/04 ? ?Today's Date: 06/15/2021 ?OT Individual Time: [id number] ?OT Individual Time Calculation (min): 55 min  ? ? ?Short Term Goals: ?Week 1:  OT Short Term Goal 1 (Week 1): pt will consistently stand from wc with CGA to RW ?OT Short Term Goal 2 (Week 1): Pt will demonstrate improved perception and motor planning to don a shirt with set up. ?OT Short Term Goal 3 (Week 1): Pt will complete toilet transfer with min A. ?OT Short Term Goal 4 (Week 1): Pt will don pants with mod A. ?    ? ?Skilled Therapeutic Interventions/Progress Updates:  ?  Pt received in recliner ready for therapy. His friend was visiting and observed therapy.  Pt needed min A to stand up from low recliner to RW and then practiced ambulating in room to arm chair. Cues to step [place] close to chair to avoid taking too many steps backwards.  Pt did very well with sit to stands from arm chair with Supervision only.  Worked on UE AROM from chair.  Ambulated to sit EOB to work on LE AROM exercises.  Pt moving actively today and had more energy.   ?Pt agreeable to practicing toileting and shower transfers. Pt able to stand from bed S and ambulate to elevated toilet with CGA, S with sit to stand from toilet.  He then stepped towards shower with mod cues for positioning to sit safely onto edge of bench.  With cues, pt used R hand on bar to pivot legs into shower.   Pt did well sitting on bench and should do well showering in a future session.  Pt then ambulated back to recliner with CGA. Pt resting in recliner and hand off to next therapist.   ? ?Therapy Documentation ?Precautions:  ?Precautions ?Precautions: Fall ?Precaution Comments: L radial heart cath on 4/5 ?Restrictions ?Weight Bearing Restrictions: No ? ?  ?Vital Signs: ?Therapy Vitals ?Temp: 98.2 ?F (36.8 ?C) ?Temp Source: Oral ?Pulse Rate: 61 ?Resp: 18 ?BP: 107/70 ?Patient Position (if  appropriate): Lying ?Oxygen Therapy ?SpO2: 100 % ?O2 Device: Room Air ?Pain: ?Pain Assessment ?Pain Scale: 0-10 ?Pain Score: 0-No pain ?ADL: ?ADL ?Eating: Set up ?Grooming: Minimal cueing, Minimal assistance ?Upper Body Bathing: Supervision/safety ?Where Assessed-Upper Body Bathing: Wheelchair ?Lower Body Bathing: Moderate assistance ?Where Assessed-Lower Body Bathing: Wheelchair ?Upper Body Dressing: Moderate assistance ?Where Assessed-Upper Body Dressing: Wheelchair ?Lower Body Dressing: Maximal assistance ?Where Assessed-Lower Body Dressing: Wheelchair ?Toileting: Maximal assistance ?Where Assessed-Toileting: Bedside Commode ?Toilet Transfer: Moderate assistance ?Toilet Transfer Method: Stand pivot ? ? ? ?Therapy/Group: Individual Therapy ? ?[place] ?06/15/2021, 10:35 AM ?

## 2021-06-15 NOTE — Progress Notes (Signed)
?                                                       PROGRESS NOTE ? ? ?Subjective/Complaints: He reports he feels well this am with no concerns or complaints.  ? ?ROS: Negative for chills, fever, dysuria, urgency, or incontinence. No HA. Denies pain. ? ?Objective: ?  ?No results found. ?Recent Labs  ?  06/13/21 ?0614 06/15/21 ?[id number]  ?WBC 5.2 5.7  ?HGB 11.8* 12.2*  ?HCT 37.0* 38.6*  ?PLT 134* 159  ? ?Recent Labs  ?  06/13/21 ?0614 06/15/21 ?[id number]  ?NA 135 134*  ?K 4.9 5.2*  ?CL 98 97*  ?CO2 26 28  ?GLUCOSE 84 93  ?BUN 61* 62*  ?CREATININE 2.38* 2.45*  ?CALCIUM 9.1 9.2  ? ? ?Intake/Output Summary (Last 24 hours) at 06/15/2021 0810 ?Last data filed at 06/15/2021 0535 ?Gross per 24 hour  ?Intake 840 ml  ?Output 1000 ml  ?Net -160 ml  ?  ? ?Pressure Injury 06/03/21 Sacrum Left Stage 2 -  Partial thickness loss of dermis presenting as a shallow open injury with a red, pink wound bed without slough. (Active)  ?06/03/21 1800  ?Location: Sacrum  ?Location Orientation: Left  ?Staging: Stage 2 -  Partial thickness loss of dermis presenting as a shallow open injury with a red, pink wound bed without slough.  ?Wound Description (Comments):   ?Present on Admission:   ?   ?Pressure Injury 06/08/21 Sacrum Right Stage 2 -  Partial thickness loss of dermis presenting as a shallow open injury with a red, pink wound bed without slough. (Active)  ?06/08/21 1755  ?Location: Sacrum  ?Location Orientation: Right  ?Staging: Stage 2 -  Partial thickness loss of dermis presenting as a shallow open injury with a red, pink wound bed without slough.  ?Wound Description (Comments):   ?Present on Admission: Yes  ? ? ?Physical Exam: ?Vital Signs ?Blood pressure 107/70, pulse 61, temperature 98.2 ?F (36.8 ?C), temperature source Oral, resp. rate 18, height 6\' 7"  (2.007 m), weight 82.7 kg, SpO2 100 %. ? ?Constitutional: No distress . Vital signs reviewed. ?HEENT: NCAT, EOMI, oral membranes moist, hard of hearing ?Neck: supple ?Cardiovascular: RRR  without murmur. No JVD    ?Respiratory/Chest: CTA Bilaterally without wheezes or rales. Normal effort    ?GI/Abdomen: BS +, non-tender, non-distended, soft ?Neurological: alert and oriented, respond appropriately.  Moving all 4 extremities. Grip strenght 5/5 in b/l UE, 4+/5 elbow flexion and extension ?Ext: no clubbing, cyanosis, or edema ?Psych: pleasant and cooperative , follows commands ? ?Skin: ?Comments: Small skin ulcer on RLE covered in bordered foam dressing, appears to be improving. No  surrounding errythema  ? ?Assessment/Plan: ?1. Functional deficits which require 3+ hours per day of interdisciplinary therapy in a comprehensive inpatient rehab setting. ?Physiatrist is providing close team supervision and 24 hour management of active medical problems listed below. ?Physiatrist and rehab team continue to assess barriers to discharge/monitor patient progress toward functional and medical goals ? ?Care Tool: ? ?Bathing ?   ?Body parts bathed by patient: Chest, Left arm, Right arm, Abdomen, Right upper leg, Left upper leg, Face  ? Body parts bathed by helper: Front perineal area, Buttocks, Right lower leg, Left lower leg ?  ?  ?Bathing assist Assist Level: Moderate Assistance - Patient 50 - 74% ?  ?  ?  Upper Body Dressing/Undressing ?Upper body dressing   ?What is the patient wearing?: Pull over shirt ?   ?Upper body assist Assist Level: Minimal Assistance - Patient > 75% ?   ?Lower Body Dressing/Undressing ?Lower body dressing ? ? ?   ?What is the patient wearing?: Pants ? ?  ? ?Lower body assist Assist for lower body dressing: Moderate Assistance - Patient 50 - 74% ?   ? ?Toileting ?Toileting    ?Toileting assist Assist for toileting: Total Assistance - Patient < 25% ?  ?  ?Transfers ?Chair/bed transfer ? ?Transfers assist ?   ? ?Chair/bed transfer assist level: Contact Guard/Touching assist ?  ?  ?Locomotion ?Ambulation ? ? ?Ambulation assist ? ?   ? ?Assist level: Contact Guard/Touching assist ?Assistive  device: [place]-rolling ?Max distance: 23 feet  ? ?Walk 10 feet activity ? ? ?Assist ? Walk 10 feet activity did not occur: Safety/medical concerns ? ?Assist level: Contact Guard/Touching assist ?Assistive device: [place]-rolling  ? ?Walk 50 feet activity ? ? ?Assist Walk 50 feet with 2 turns activity did not occur: Safety/medical concerns ? ?  ?   ? ? ?Walk 150 feet activity ? ? ?Assist Walk 150 feet activity did not occur: Safety/medical concerns ? ?  ?  ?  ? ?Walk 10 feet on uneven surface  ?activity ? ? ?Assist Walk 10 feet on uneven surfaces activity did not occur: Safety/medical concerns ? ? ?  ?   ? ?Wheelchair ? ? ? ? ?Assist Is the patient using a wheelchair?: Yes ?Type of Wheelchair: Manual ?  ? ?Wheelchair assist level: Minimal Assistance - Patient > 75% ?Max wheelchair distance: 50  ? ? ?Wheelchair 50 feet with 2 turns activity ? ? ? ?Assist ? ?  ?  ? ? ?Assist Level: Minimal Assistance - Patient > 75%  ? ?Wheelchair 150 feet activity  ? ? ? ?Assist ?   ? ? ?Assist Level: Moderate Assistance - Patient 50 - 74%  ? ?Blood pressure 107/70, pulse 61, temperature 98.2 ?F (36.8 ?C), temperature source Oral, resp. rate 18, height 6\' 7"  (2.007 m), weight 82.7 kg, SpO2 100 %. ? ?Medical Problem List and Plan: ?1. Functional deficits secondary to heart failure/debility ?            -patient may shower ?            -ELOS/Goals: S 10-14 days ?            -Con't CIR- PT and OT ?2.  Antithrombotics: ?-DVT/anticoagulation:  Pharmaceutical: Other (comment)Eliquis 5 mg BID ?            -antiplatelet therapy: none ?3. Pain Management: Tylenol as needed ?            4/27- denies pain- con't regimen ?4. Mood: LCSW to evaluate and provide emotional support ?            -antipsychotic agents: n/a ?5. Neuropsych: This patient is capable of making decisions on his own behalf. ? -5/1 Pt reports good mood overall ?6. Skin/Wound Care: Routine skin care checks ?            -- continue local wound care to RLE with Medihoney ?             -4/30 Continue MediHoney, site covered by foam pad. ? -5/2 wound appears to be improving ?7. Fluids/Electrolytes/Nutrition: Strict Is and Os and follow-up chemistries + mag in AM ?            --  per HF team>>K+ greater than 4 and mag greater than 2 ?            4/27- K+ 4.6- and Mg 2.1- con't regimen ?            -4/30 qMonday labs to trend ?8: Nonischemic cardiomyopathy ?9: Acute on chronic biventricular heart failure (off Toprol Xl due to soft BP) ?            --daily weight ?            --continue torsemide 40 mg daily ?-- continue midodrine 15 mg 3 times daily ?-- continue amiodarone 400 mg twice daily ?--mexilitine 300 mg BID ?-- Mag-Ox 400 mg daily ? -5/2 Daily weight appears stable overall ?10: Valvular heart disease: s/p TEER MVR, still with 3+ MR ?--tricuspid regurg>>moderate on TEE ?11: Acute pulmonary embolism: ?--continue Eliquis 5 mg twice daily ?12: Acute kidney injury atop chronic kidney disease stage IIIb; serum Cr is baseline 1.4-1.7 ?            --current Cr 2.1>>follow-up BMP ?--off spironolactone ?4/27- Cr stable today at 2.11 and BUN 54- is worse than baseline- however don't want to give IVFs right now due to CHF exacerbation just got over- will encourage him to drink and monitor ?            4/28 will order repeat labs for Monday ?           -5/3 Cr increased to 2.45 Bun 61, may need decrease torsemide, will  ? Contact heart failure team to discuss ?13: Dyslipidemia: ?-- continue Lipitor 10 mg daily ?14: Ascending aortic aneurysm ?-- follow-up CT surgery as outpatient ?15: CAD: Left heart cath with mild nonobstructive CAD, continue statin ?16: NSVT/PVCs: Goal to keep potassium greater than 4 and magnesium greater than 2 ?--heart meds as above #9 ?--not ablation candidate ?17: Right lower extremity wound: Local wound care ?            -- Bilateral ABIs noncompressible ?             -Continue medihoney and bordered foam dressing ?18: Hyponatremia: serum sodium 134; limit free water>>follow-up  BMP ?4/27- Na 135- con't to monitor ?5/1- NA 135 continue to monitor ?19: Hyperkalemia currently resolved>>follow-up BMP ?            --keep K+ greater than 4 ?            -4/27 K+ is 4.6, continue to monit

## 2021-06-16 LAB — BASIC METABOLIC PANEL
Anion gap: 11 (ref 5–15)
BUN: 69 mg/dL — ABNORMAL HIGH (ref 8–23)
CO2: 26 mmol/L (ref 22–32)
Calcium: 9.1 mg/dL (ref 8.9–10.3)
Chloride: 98 mmol/L (ref 98–111)
Creatinine, Ser: 2.49 mg/dL — ABNORMAL HIGH (ref 0.61–1.24)
GFR, Estimated: 25 mL/min — ABNORMAL LOW (ref >60)
Glucose, Bld: 85 mg/dL (ref 70–99)
Potassium: 4.7 mmol/L (ref 3.5–5.1)
Sodium: 135 mmol/L (ref 135–145)

## 2021-06-16 LAB — CBC
HCT: 36.4 % — ABNORMAL LOW (ref 39.0–52.0)
Hemoglobin: 12 g/dL — ABNORMAL LOW (ref 13.0–17.0)
MCH: 26.7 pg (ref 26.0–34.0)
MCHC: 33 g/dL (ref 30.0–36.0)
MCV: 81.1 fL (ref 80.0–100.0)
Platelets: 146 10*3/uL — ABNORMAL LOW (ref 150–400)
RBC: 4.49 MIL/uL (ref 4.22–5.81)
RDW: 20.3 % — ABNORMAL HIGH (ref 11.5–15.5)
WBC: 6.3 10*3/uL (ref 4.0–10.5)
nRBC: 0 % (ref 0.0–0.2)

## 2021-06-16 NOTE — Progress Notes (Signed)
?                                                       PROGRESS NOTE ? ? ?Subjective/Complaints: No acute events overnight. No new complaints. He was seen yesterday by heart failure team. ? ?ROS: Negative for chills, fever, dysuria, urgency, or incontinence. No Nausea, no diarrhea. No CP, SOB.  Denies pain. ? ?Objective: ?  ?No results found. ?Recent Labs  ?  06/15/21 ?3299 06/16/21 ?0510  ?WBC 5.7 6.3  ?HGB 12.2* 12.0*  ?HCT 38.6* 36.4*  ?PLT 159 146*  ? ? ?Recent Labs  ?  06/15/21 ?2426 06/16/21 ?0510  ?NA 134* 135  ?K 5.2* 4.7  ?CL 97* 98  ?CO2 28 26  ?GLUCOSE 93 85  ?BUN 62* 69*  ?CREATININE 2.45* 2.49*  ?CALCIUM 9.2 9.1  ? ? ? ?Intake/Output Summary (Last 24 hours) at 06/16/2021 0750 ?Last data filed at 06/15/2021 1847 ?Gross per 24 hour  ?Intake 417 ml  ?Output 900 ml  ?Net -483 ml  ? ?  ? ?Pressure Injury 06/03/21 Sacrum Left Stage 2 -  Partial thickness loss of dermis presenting as a shallow open injury with a red, pink wound bed without slough. (Active)  ?06/03/21 1800  ?Location: Sacrum  ?Location Orientation: Left  ?Staging: Stage 2 -  Partial thickness loss of dermis presenting as a shallow open injury with a red, pink wound bed without slough.  ?Wound Description (Comments):   ?Present on Admission:   ?   ?Pressure Injury 06/08/21 Sacrum Right Stage 2 -  Partial thickness loss of dermis presenting as a shallow open injury with a red, pink wound bed without slough. (Active)  ?06/08/21 1755  ?Location: Sacrum  ?Location Orientation: Right  ?Staging: Stage 2 -  Partial thickness loss of dermis presenting as a shallow open injury with a red, pink wound bed without slough.  ?Wound Description (Comments):   ?Present on Admission: Yes  ? ? ?Physical Exam: ?Vital Signs ?Blood pressure 106/67, pulse 64, temperature 98 ?F (36.7 ?C), temperature source Oral, resp. rate 18, height 6\' 7"  (2.007 m), weight 82.7 kg, SpO2 100 %. ? ?Constitutional: No distress . Vital signs reviewed. Sitting in chair ?HEENT: NCAT, EOMI,  oral membranes moist, hard of hearing ?Neck: supple ?Cardiovascular: RRR without murmur.     ?Respiratory/Chest: CTA Bilaterally without wheezes or rales. Normal effort    ?GI/Abdomen: soft, BS +, non-tender, non-distended, soft ?Neurological: alert and oriented, respond appropriately.  Moving all 4 extremities. Grip strenght 5/5 in b/l UE, 4+/5 elbow flexion and extension. CN grossly intact ?Ext: no clubbing, cyanosis, or edema ?Psych: pleasant and cooperative , follows commands ? ?Skin: ?Comments: Small skin ulcer on RLE covered in bordered foam dressing, appears to be improving. No  surrounding errythema  ? ?Assessment/Plan: ?1. Functional deficits which require 3+ hours per day of interdisciplinary therapy in a comprehensive inpatient rehab setting. ?Physiatrist is providing close team supervision and 24 hour management of active medical problems listed below. ?Physiatrist and rehab team continue to assess barriers to discharge/monitor patient progress toward functional and medical goals ? ?Care Tool: ? ?Bathing ?   ?Body parts bathed by patient: Chest, Left arm, Right arm, Abdomen, Right upper leg, Left upper leg, Face, Front perineal area, Left lower leg, Right lower leg  ? Body parts bathed by  helper: Buttocks ?  ?  ?Bathing assist Assist Level: Minimal Assistance - Patient > 75% ?  ?  ?Upper Body Dressing/Undressing ?Upper body dressing   ?What is the patient wearing?: Pull over shirt ?   ?Upper body assist Assist Level: Moderate Assistance - Patient 50 - 74% ?   ?Lower Body Dressing/Undressing ?Lower body dressing ? ? ?   ?What is the patient wearing?: Pants ? ?  ? ?Lower body assist Assist for lower body dressing: Minimal Assistance - Patient > 75% ?   ? ?Toileting ?Toileting    ?Toileting assist Assist for toileting: Total Assistance - Patient < 25% ?  ?  ?Transfers ?Chair/bed transfer ? ?Transfers assist ?   ? ?Chair/bed transfer assist level: Contact Guard/Touching assist ?  ?   ?Locomotion ?Ambulation ? ? ?Ambulation assist ? ?   ? ?Assist level: Contact Guard/Touching assist ?Assistive device: [place]-rolling ?Max distance: 23 feet  ? ?Walk 10 feet activity ? ? ?Assist ? Walk 10 feet activity did not occur: Safety/medical concerns ? ?Assist level: Contact Guard/Touching assist ?Assistive device: [place]-rolling  ? ?Walk 50 feet activity ? ? ?Assist Walk 50 feet with 2 turns activity did not occur: Safety/medical concerns ? ?  ?   ? ? ?Walk 150 feet activity ? ? ?Assist Walk 150 feet activity did not occur: Safety/medical concerns ? ?  ?  ?  ? ?Walk 10 feet on uneven surface  ?activity ? ? ?Assist Walk 10 feet on uneven surfaces activity did not occur: Safety/medical concerns ? ? ?  ?   ? ?Wheelchair ? ? ? ? ?Assist Is the patient using a wheelchair?: Yes ?Type of Wheelchair: Manual ?  ? ?Wheelchair assist level: Minimal Assistance - Patient > 75% ?Max wheelchair distance: 37  ? ? ?Wheelchair 50 feet with 2 turns activity ? ? ? ?Assist ? ?  ?  ? ? ?Assist Level: Minimal Assistance - Patient > 75%  ? ?Wheelchair 150 feet activity  ? ? ? ?Assist ?   ? ? ?Assist Level: Moderate Assistance - Patient 50 - 74%  ? ?Blood pressure 106/67, pulse 64, temperature 98 ?F (36.7 ?C), temperature source Oral, resp. rate 18, height 6\' 7"  (2.007 m), weight 82.7 kg, SpO2 100 %. ? ?Medical Problem List and Plan: ?1. Functional deficits secondary to heart failure/debility ?            -patient may shower ?            -ELOS/Goals: S 10-14 days ?            -Con't CIR- PT and OT ?2.  Antithrombotics: ?-DVT/anticoagulation:  Pharmaceutical: Other (comment)Eliquis 5 mg BID ?            -antiplatelet therapy: none ?3. Pain Management: Tylenol as needed ?            4/27- denies pain- con't regimen ?4. Mood: LCSW to evaluate and provide emotional support ?            -antipsychotic agents: n/a ?5. Neuropsych: This patient is capable of making decisions on his own behalf. ? -5/1 Pt reports good mood overall ?6.  Skin/Wound Care: Routine skin care checks ?            -- continue local wound care to RLE with Medihoney ?            -4/30 Continue MediHoney, site covered by foam pad. ? -5/2 wound appears to be improving ?7. Fluids/Electrolytes/Nutrition: Strict Is  and Os and follow-up chemistries + mag in AM ?            --per HF team>>K+ greater than 4 and mag greater than 2 ?            4/27- K+ 4.6- and Mg 2.1- con't regimen ?            -4/30 qMonday labs to trend ?8: Nonischemic cardiomyopathy ?9: Acute on chronic biventricular heart failure (off Toprol Xl due to soft BP) ?            --daily weight ?            --continue torsemide 40 mg daily ?-- continue midodrine 15 mg 3 times daily ?-- continue amiodarone 400 mg twice daily ?--mexilitine 300 mg BID ?-- Mag-Ox 400 mg daily ? -5/2 Daily weight appears stable overall ?10: Valvular heart disease: s/p TEER MVR, still with 3+ MR ?--tricuspid regurg>>moderate on TEE ?11: Acute pulmonary embolism: ?--continue Eliquis 5 mg twice daily ?12: Acute kidney injury atop chronic kidney disease stage IIIb; serum Cr is baseline 1.4-1.7 ?            --current Cr 2.1>>follow-up BMP ?--off spironolactone ?4/27- Cr stable today at 2.11 and BUN 54- is worse than baseline- however don't want to give IVFs right now due to CHF exacerbation just got over- will encourage him to drink and monitor ?            4/28 will order repeat labs for Monday ?           -5/3 Cr increased to 2.45 Bun 61, may need decrease torsemide, will  ? Contact heart failure team to discuss ?-5/4 Pt was seen by heart failure team, appreciate assistance, torsemide held this am, consider iv fluids if remains elevated ?13: Dyslipidemia: ?-- continue Lipitor 10 mg daily ?14: Ascending aortic aneurysm ?-- follow-up CT surgery as outpatient ?15: CAD: Left heart cath with mild nonobstructive CAD, continue statin ?16: NSVT/PVCs: Goal to keep potassium greater than 4 and magnesium greater than 2 ?--heart meds as above #9 ?--not  ablation candidate ?17: Right lower extremity wound: Local wound care ?            -- Bilateral ABIs noncompressible ?             -Continue medihoney and bordered foam dressing ?18: Hyponatremia: serum sodium 134; limit free water>>f

## 2021-06-16 NOTE — Plan of Care (Signed)
?  Problem: Consults ?Goal: RH GENERAL PATIENT EDUCATION ?Description: See Patient Education module for education specifics. ?Outcome: Progressing ?  ?Problem: RH BOWEL ELIMINATION ?Goal: RH STG MANAGE BOWEL WITH ASSISTANCE ?Description: STG Manage Bowel with mod I Assistance. ?Outcome: Progressing ?  ?Problem: RH BLADDER ELIMINATION ?Goal: RH STG MANAGE BLADDER WITH ASSISTANCE ?Description: STG Manage Bladder With toileting Assistance ?Outcome: Progressing ?  ?Problem: RH SKIN INTEGRITY ?Goal: RH STG MAINTAIN SKIN INTEGRITY WITH ASSISTANCE ?Description: STG Maintain Skin Integrity With min Assistance. ?Outcome: Progressing ?Goal: RH STG ABLE TO PERFORM INCISION/WOUND CARE W/ASSISTANCE ?Description: STG Able To Perform Incision/Wound Care With min Assistance. ?Outcome: Progressing ?  ?Problem: RH SAFETY ?Goal: RH STG ADHERE TO SAFETY PRECAUTIONS W/ASSISTANCE/DEVICE ?Description: STG Adhere to Safety Precautions With cues Assistance/Device. ?Outcome: Progressing ?  ?Problem: RH KNOWLEDGE DEFICIT GENERAL ?Goal: RH STG INCREASE KNOWLEDGE OF SELF CARE AFTER HOSPITALIZATION ?Description: Patient and family will be able to manage care at discharge using handouts and educational tools independently ?Outcome: Progressing ?  ?

## 2021-06-16 NOTE — Progress Notes (Signed)
Physical Therapy Session Note ? ?Patient Details  ?Name: Matthew [name] ?MRN: [id number] ?Date of Birth: [birth year] ? ?Today's Date: 06/16/2021 ?PT Individual Time: 1050-1200 ?PT Individual Time Calculation (min): 70 min  ? ?Short Term Goals: ?Week 1:  PT Short Term Goal 1 (Week 1): Patient will complete bed mobility with MinA ?PT Short Term Goal 2 (Week 1): Patient will complete sit <> stand with LRAD and ModA consistently ?PT Short Term Goal 3 (Week 1): Patient will ambulate >4ft with LRAD and ModA ? ?Skilled Therapeutic Interventions/Progress Updates:  ?   ?Pt received seated in recliner and agrees to therapy. No complaint of pain. Sit to stand from recliner with multimodal cues for body mechanics and anterior weight shift, with modA and use of RW. Pt ambulates x6' to [facility]. WC transport to gym for time management. Stand from [facility] only requires light minA with improved hand placement and sequencing. Pt performs stand step transfer to Nustep with CGA and cues for positioning. Pt completes Nustep for strength and endurance training. PT provides cues for foot placement and completing full ROM. Pt completes x20:00 at workload of 5 with average steps per minute ~50. ? ?Stand step transfer to [facility] with CGA and cues for sequencing. WC transport to gym. Pt performs ambulation with RW and CGA, x38' and x50' with extended seated rest break in between. Cues provided for RW management and positioning for safety.  ? ?Pt performs 2x20 ball tosses into trampoline to work on muscular endurance while seated. Cues to lift ball above head for expansion of anterior chest wall and increased challenge. ? ?Pt propels WC x100' with bilateral upper extremities and cues for efficiency. Performed for functional endurance training.  ? ?Stand step transfer back to recliner with CGA. Left with alarm intact and all needs within reach. ? ? ? ?Therapy Documentation ?Precautions:  ?Precautions ?Precautions: Fall ?Precaution Comments: L radial heart cath  on 4/5 ?Restrictions ?Weight Bearing Restrictions: No ? ? ?Therapy/Group: Individual Therapy ? ?Beau Fanny, PT, DPT ?06/16/2021, 5:25 PM  ?

## 2021-06-16 NOTE — Progress Notes (Signed)
Occupational Therapy Session Note ? ?Patient Details  ?Name: Matthew [name] ?MRN: [id number] ?Date of Birth: Nov 23, 1940 ? ?Today's Date: 06/16/2021 ?OT Individual Time: 8144-8185 ?OT Individual Time Calculation (min): 66 min  and Today's Date: 06/16/2021 ? ?  ? ? ?Short Term Goals: ?Week 1:  OT Short Term Goal 1 (Week 1): pt will consistently stand from wc with CGA to RW ?OT Short Term Goal 2 (Week 1): Pt will demonstrate improved perception and motor planning to don a shirt with set up. ?OT Short Term Goal 3 (Week 1): Pt will complete toilet transfer with min A. ?OT Short Term Goal 4 (Week 1): Pt will don pants with mod A. ?Week 2:    ? ?Skilled Therapeutic Interventions/Progress Updates:  ? Patient seen this AM for OT services, pt seated at w/c LOF upon arrival. Patient indicated that he was open to working on BADL related task in bathing and dressin at sink level. The pt rolled to the sink area with MinA for maneuvering around in a confined space.  The pt was able to transfer from seated at w/c level  to standing using the RW for balance  for doffing his LB garments,  brief and shorts with MinA the pt was able to remove his pull over shirt with SBA.   The pt was able to wash his face, arms, chest, ULEs , and perineal area with verbal cues for performance and s/u assist.  The pt was able to come from sit to stand to wash his bottom with MinA, incorporating the RW for balance, the pt was  MaxA for task performance of washing his bottom and his feet. The pt was able to apply lotion and deodorant with s/u assist, he was s/u for donning a cover head shirt as well. He was MaxA for donning his brief and pants.  The pt opted to sit in the recliner at the end of the treatment session, he was able to transfer from the w/c to the recliner by ambulating from the w/c to the recliner using the RW for balance.  The pt indicated that he was pain free at the time of treatment, his call light and bedside table were within reach and  his alarm was activated.  All additional needs were address prior to exiting the room.  ? ?Therapy Documentation ?Precautions:  ?Precautions ?Precautions: Fall ?Precaution Comments: L radial heart cath on 4/5 ?Restrictions ?Weight Bearing Restrictions: No ? ?Therapy/Group: Individual Therapy ? ?Lavona Mound ?06/16/2021, 11:20 AM ?

## 2021-06-16 NOTE — Progress Notes (Addendum)
? ? Advanced Heart Failure Rounding Note ? ?PCP-Cardiologist: Larae Grooms, MD  ? ?Subjective:   ? ?Discharged to CIR 4/26 ? ?Torsemide held today for AKI. SCr still above baseline but stable past 24 hrs 2.44>>2.49.  ? ?SBPs ok low 100s-110 ? ?Hyperkalemia corrected w/ Lokelma, 5.2>>4.7 today.  ? ?Feels well today. Had rehab this am. Currently eating lunch. No dyspnea.  ? ? ?Objective:   ?Weight Range: ?82.7 kg ?Body mass index is 20.54 kg/m?.  ? ?Vital Signs:   ?Temp:  [97.5 ?F (36.4 ?C)-98 ?F (36.7 ?C)] 98 ?F (36.7 ?C) (05/04 0315) ?Pulse Rate:  [60-64] 64 (05/04 0315) ?Resp:  [17-18] 18 (05/04 0315) ?BP: (106-110)/(67-71) 106/67 (05/04 0315) ?SpO2:  [100 %] 100 % (05/04 0315) ?Last BM Date : 06/15/21 ? ?Weight change: ?Filed Weights  ? 06/12/21 0348 06/13/21 0339 06/14/21 0500  ?Weight: 83.2 kg 81.7 kg 82.7 kg  ? ? ?Intake/Output:  ? ?Intake/Output Summary (Last 24 hours) at 06/16/2021 1209 ?Last data filed at 06/16/2021 [id number] ?Gross per 24 hour  ?Intake 360 ml  ?Output 900 ml  ?Net -540 ml  ?  ? ? ?Physical Exam  ?  ?PHYSICAL EXAM: ?General:  elderly male, sitting up eating lunch. No respiratory difficulty ?HEENT: normal ?Neck: supple. no JVD. Carotids 2+ bilat; no bruits. No lymphadenopathy or thyromegaly appreciated. ?Cor: PMI nondisplaced. Regular rate & rhythm. 2/6 MR murmur  ?Lungs: clear ?Abdomen: soft, nontender, nondistended. No hepatosplenomegaly. No bruits or masses. Good bowel sounds. ?Extremities: no cyanosis, clubbing, rash, edema ?Neuro: alert & oriented x 3, cranial nerves grossly intact. moves all 4 extremities w/o difficulty. Affect pleasant. ? ? ?Telemetry  ? ?N/A  ? ?Labs  ?  ?CBC ?Recent Labs  ?  06/15/21 ?[id number] 06/16/21 ?0510  ?WBC 5.7 6.3  ?HGB 12.2* 12.0*  ?HCT 38.6* 36.4*  ?MCV 81.8 81.1  ?PLT 159 146*  ? ?Basic Metabolic Panel ?Recent Labs  ?  06/15/21 ?[id number] 06/16/21 ?0510  ?NA 134* 135  ?K 5.2* 4.7  ?CL 97* 98  ?CO2 28 26  ?GLUCOSE 93 85  ?BUN 62* 69*  ?CREATININE 2.45* 2.49*  ?CALCIUM  9.2 9.1  ?MG 2.2  --   ? ?Liver Function Tests ?No results for input(s): AST, ALT, ALKPHOS, BILITOT, PROT, ALBUMIN in the last 72 hours. ? ?No results for input(s): LIPASE, AMYLASE in the last 72 hours. ?Cardiac Enzymes ?No results for input(s): CKTOTAL, CKMB, CKMBINDEX, TROPONINI in the last 72 hours. ? ?BNP: ?BNP (last 3 results) ?Recent Labs  ?  05/09/21 ?[id number] 05/10/21 ?0205 05/13/21 ?2142  ?BNP >4,500.0* >4,500.0* >4,500.0*  ? ? ?ProBNP (last 3 results) ?No results for input(s): PROBNP in the last 8760 hours. ? ? ?D-Dimer ?No results for input(s): DDIMER in the last 72 hours. ?Hemoglobin A1C ?No results for input(s): HGBA1C in the last 72 hours. ?Fasting Lipid Panel ?No results for input(s): CHOL, HDL, LDLCALC, TRIG, CHOLHDL, LDLDIRECT in the last 72 hours. ?Thyroid Function Tests ?No results for input(s): TSH, T4TOTAL, T3FREE, THYROIDAB in the last 72 hours. ? ?Invalid input(s): FREET3 ? ?Other results: ? ? ?Imaging  ? ? ?No results found. ? ? ?Medications:   ? ? ?Scheduled Medications: ? amiodarone  400 mg Oral BID  ? amoxicillin-clavulanate  1 tablet Oral BID  ? apixaban  5 mg Oral BID  ? atorvastatin  10 mg Oral Daily  ? feeding supplement  1 Container Oral TID BM  ? leptospermum manuka honey  1 application. Topical Daily  ? magnesium oxide  400 mg Oral Daily  ? mexiletine  300 mg Oral Q12H  ? midodrine  15 mg Oral TID WC  ? pantoprazole  40 mg Oral Daily  ? [START ON 06/17/2021] torsemide  20 mg Oral Daily  ? ? ?Infusions: ? ? ?PRN Medications: ?acetaminophen, alum & mag hydroxide-simeth, diphenhydrAMINE, guaiFENesin-dextromethorphan, methocarbamol, prochlorperazine **OR** prochlorperazine **OR** prochlorperazine, senna-docusate, sodium phosphate, sorbitol, traMADol, traZODone ? ? ? ?Patient Profile  ? ?81 y.o. male with history of chronic biventricular HF, NICM, CAD, severe MR/TR, HTN, obesity and multiple recent admissions,  admitted w/ acute PE, a/c CHF and frequent PVCs.  ?  ?Echo, EF 25%, mildly dilated  RV with moderately decreased systolic function, severe MR and TR. IVC dilated ?  ?Underwent MitraClip 4/20.  ?Discharged to CIR 4/26  ? ?Assessment/Plan  ? ?1. Chronic Biventricular HF   ?- Recent admit for a/c CHF requiring IV Lasix and milrinone  ?- ECHO 04/2020 EF 25-30% with mild MR.   ?- Echo 04/04/21 EF LVEF 20-25%, RV moderately reduced, LA/RA severely dilated, and severe MR.  ?- Echo per Dr. 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 Not candidate for PVC ablation. S/P MTEER but still with 3+ MR.  ?- stable off milrinone.  ?- suspect his is dry given AKI/exam. Hold torsemide today. Repeat BMP in AM ?- GDMT limited d/t hypotension and AKI. Continue midodrine 15 mg TID with soft BP to help w/ renal perfusion  ?- Off Toprol XL with soft BP.   ?- Off Spiro w/ elevated K and SCr ?  ?2. CAD ?- LHC w/ mild non-obstructive CAD ?- denies CP ?- continue statin ?- no ASA w/ Eliquis ?- off ? blocker w/ soft BP  ?  ?3. Mitral Regurgitation ?- Severe on echo 02/23 ?- TEE showed mild restriction of P2. Severe central/posterior MR due to P2 restriction and annular dilation ?- functional, severely dilated LA and LV  ?- S/P MTEER => still with 3+ MR.  ?  ?4. Tricuspid Regurgitation  ?- moderate on TEE  ?- functional, severely dilated RA and RV   ?- HF optimization per above  ?  ?5. Acute PE ?- On Eliquis.  ?- No evidence of DVT on [place] ?- ? 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  ?6. Hyperlipidemia ?- Continue statin ?  ?8. AKI on CKD IIIa ?- Previous Scr  baseline 1.4-1.7. Last few months ~ 2.1  ?- Suspect  cardiorenal syndrome. ?- SCr trending back up, 2.1>>2.4. Stable today at 2.4  ?- Hold torsemide today ?- repeat BMP tomorrow. If Scr up again tomorrow can give IVF back  ?  ?9. NSVT/PVCs ?- recent high burden, suspect contributing to CM  ?- Slightly improved post MTEER ?- continue mexiletine 300 mg BID  ?- continue amiodarone 400 mg bid for now but will ultimately need dose reduction  ?- Keep K > 4 and Mag > 2 ?- per EP, he is not an ablation candidate ?  ? 10. Debility ?- c/w therapy ?- appreciate CIR's assistance  ? ?11. Hyperkalemia ?- corrected w/ lokelma, 5.2>>4.7 ?- off spiro ?-  follow BMP  ? ? ? ?Length of Stay: 8 ? ?Lyda Jester, PA-C  ?06/16/2021, 12:09 PM ? ?Advanced Heart Failure Team ?Pager [phone] (M-F; 7a - 5p)  ?Please contact [facility] for night-coverage after hours (5p -7a ) and weekends on [email or web address] ?  ?Patient seen and examined with the above-signed Advanced Practice Provider and/or Housestaff. I personally reviewed laboratory data, imaging studies and relevant notes. I independently examined the patient and formulated the important aspects of the plan. I have edited the note to reflect any of my changes or salient points. I have personally discussed the plan with the patient and/or family. ? ?Feels good. Working with Rehab. Diuretics on hold. Scr up slightly but essentially stable. Hyperkalemia resolved. Denies SOB, orthopnea or PND.  ? ?General:  Sitting in chair  No resp difficulty ?HEENT: normal ?Neck: supple. no JVD. Carotids 2+ bilat; no bruits. No lymphadenopathy or thryomegaly appreciated. ?Cor: PMI nondisplaced. Mildly irregular  2/6 MR ?Lungs: clear ?Abdomen: soft, nontender, nondistended. No hepatosplenomegaly. No bruits or masses. Good bowel sounds. ?Extremities: no cyanosis, clubbing, rash, edema ?Neuro: alert & orientedx3, cranial nerves grossly intact. moves all 4 extremities w/o difficulty. Affect pleasant ? ?Overall stable from HF perspective. No evidence of recurrent  low output. Wil continue to hold diuretics. ? ?Glori Bickers, MD  ?6:42 PM ? ? ? ?

## 2021-06-16 NOTE — Progress Notes (Signed)
Physical Therapy Weekly Progress Note ? ?Patient Details  ?Name: Matthew [name] ?MRN: [id number] ?Date of Birth: [birth year] ? ?Beginning of progress report period: June 09, 2021 ?End of progress report period: Jun 16, 2021 ? ?Today's Date: 06/16/2021 ?PT Individual Time: 0932-3557; 3220-2542 ?PT Individual Time Calculation (min): 57 min and 26 mins ? ?Patient has met 3 of 3 short term goals.  Patient is making steady progress toward his goals. He is grossly CGA/MinA for functional mobility using RW at this time. He persists with crouched gait, which both he and family report are premorbid.  ? ?Patient continues to demonstrate the following deficits muscle weakness and muscle joint tightness, decreased cardiorespiratoy endurance, decreased awareness, decreased problem solving, decreased safety awareness, decreased memory, and delayed processing, and decreased standing balance, decreased postural control, and decreased balance strategies and therefore will continue to benefit from skilled PT intervention to increase functional independence with mobility. ? ?Patient progressing toward long term goals..  Continue plan of care. ? ?PT Short Term Goals ?Week 1:  PT Short Term Goal 1 (Week 1): Patient will complete bed mobility with MinA ?PT Short Term Goal 1 - Progress (Week 1): Met ?PT Short Term Goal 2 (Week 1): Patient will complete sit <> stand with LRAD and ModA consistently ?PT Short Term Goal 2 - Progress (Week 1): Met ?PT Short Term Goal 3 (Week 1): Patient will ambulate >29f with LRAD and ModA ?PT Short Term Goal 3 - Progress (Week 1): Met ?PT Short Term Goal 4 (Week 1): STG= LTG based on ELOS ?Week 2:    ? ?Skilled Therapeutic Interventions/Progress Updates:  ?  Session 1: Patient received sitting up in bed, agreeable to PT. He denies pain. Able to come sit EOB with supervision. MaxA to don pants with standing to complete pulling up. RN providing AM rx. Stand pivot to wc with RW and CGA. PT transporting patient in  wc to therapy gym for time management and energy conservation. He ambulated [id number] with seated rest break between. He remains with increased flex posture, B knee excessive flexion and genu valgum with increased B foot pronation. Patient continues to report that this is his baseline gait. Patient completing toe taps to 4" box with ModA to maintain balance. Patient propelling himself in wc to ortho gym with supervision and B UE. Patient transferring into car with CGA/MinA. Discussed with patient that he currently needs assist for a car transfer and so that person should be driving. Patient appearing upset by this. PT providing emotional support. He returned tohis room in wc, seatbelt alarm on, call light within reach.  ? ?Session 2: Patient received sitting up in recliner, agreeable to PT. He denies pain. Grossly ModA to stand from recliner using RW with verbal cues for hand placement and anterior weight shift. Ambulatory transfer to wc with MinA. PT transporting patient in wc to therapy gym for time management and energy conservation. AROM B LE knee/hip flex/ extend. PT providing over pressure stretching to L hamstrings. PT able to achieve lacking 10* extension in L knee, patient able to achieve lacking 20* full extension- noted extension lag. PT observing dysdiadochokinesia with profoundly poor alternating movements and poor motor planning. Patient with very poor insight into this and observing the difference in his movements vs this Pts. Patient ambulating ~[id number] to his room with RW and MinA regressing to [place] to fatigue and progressive crouched gait. Patient remaining up in recliner, seatbelt alarm on, call light within reach.  ?Therapy Documentation ?  Precautions:  ?Precautions ?Precautions: Fall ?Precaution Comments: L radial heart cath on 4/5 ?Restrictions ?Weight Bearing Restrictions: No ? ? ? ?Therapy/Group: Individual Therapy ? ?Debbora Dus ?Debbora Dus, PT, DPT, CBIS ? ?06/16/2021, 7:42 AM   ?

## 2021-06-17 LAB — BASIC METABOLIC PANEL
Anion gap: 9 (ref 5–15)
BUN: 70 mg/dL — ABNORMAL HIGH (ref 8–23)
CO2: 26 mmol/L (ref 22–32)
Calcium: 9.3 mg/dL (ref 8.9–10.3)
Chloride: 101 mmol/L (ref 98–111)
Creatinine, Ser: 2.45 mg/dL — ABNORMAL HIGH (ref 0.61–1.24)
GFR, Estimated: 26 mL/min — ABNORMAL LOW (ref >60)
Glucose, Bld: 100 mg/dL — ABNORMAL HIGH (ref 70–99)
Potassium: 4.7 mmol/L (ref 3.5–5.1)
Sodium: 136 mmol/L (ref 135–145)

## 2021-06-17 NOTE — Progress Notes (Signed)
?                                                       PROGRESS NOTE ? ? ?Subjective/Complaints:  Reports no new complaints this AM. Reports he is working hard with therapy.  ? ? ?ROS: Negative for chills, fever, dysuria, urgency, or incontinence. No Nausea, no diarrhea. No CP, SOB.  No HA ? ?Objective: ?  ?No results found. ?Recent Labs  ?  06/15/21 ?[id number] 06/16/21 ?0510  ?WBC 5.7 6.3  ?HGB 12.2* 12.0*  ?HCT 38.6* 36.4*  ?PLT 159 146*  ? ?Recent Labs  ?  06/16/21 ?0510 06/17/21 ?0801  ?NA 135 136  ?K 4.7 4.7  ?CL 98 101  ?CO2 26 26  ?GLUCOSE 85 100*  ?BUN 69* 70*  ?CREATININE 2.49* 2.45*  ?CALCIUM 9.1 9.3  ? ? ?Intake/Output Summary (Last 24 hours) at 06/17/2021 0728 ?Last data filed at 06/17/2021 0500 ?Gross per 24 hour  ?Intake 600 ml  ?Output 1400 ml  ?Net -800 ml  ? ?  ? ?Pressure Injury 06/03/21 Sacrum Left Stage 2 -  Partial thickness loss of dermis presenting as a shallow open injury with a red, pink wound bed without slough. (Active)  ?06/03/21 1800  ?Location: Sacrum  ?Location Orientation: Left  ?Staging: Stage 2 -  Partial thickness loss of dermis presenting as a shallow open injury with a red, pink wound bed without slough.  ?Wound Description (Comments):   ?Present on Admission:   ?   ?Pressure Injury 06/08/21 Sacrum Right Stage 2 -  Partial thickness loss of dermis presenting as a shallow open injury with a red, pink wound bed without slough. (Active)  ?06/08/21 1755  ?Location: Sacrum  ?Location Orientation: Right  ?Staging: Stage 2 -  Partial thickness loss of dermis presenting as a shallow open injury with a red, pink wound bed without slough.  ?Wound Description (Comments):   ?Present on Admission: Yes  ? ? ?Physical Exam: ?Vital Signs ?Blood pressure 121/84, pulse 65, temperature 97.6 ?F (36.4 ?C), temperature source Oral, resp. rate 18, height 6\' 7"  (2.007 m), weight 82.1 kg, SpO2 100 %. ? ?Constitutional: No distress . Vital signs reviewed. Sitting in chair ?HEENT: NCAT, EOMI, oral membranes  moist, hard of hearing ?Neck: supple ?Cardiovascular: RRR   ?Respiratory/Chest: CTA Bilaterally without wheezes or rales. Normal effort    ?GI/Abdomen: soft, BS +, non-tender, non-distended, soft ?Neurological: alert and oriented, respond appropriately.  Moving all 4 extremities. CN grossly intact  ?Tone normal, no joint swelling noted ?Ext: no clubbing, cyanosis, or edema ?Psych: pleasant and cooperative , follows commands ? ?Skin: ?Comments: Small skin ulcer on RLE covered in bordered foam dressing ? ?Assessment/Plan: ?1. Functional deficits which require 3+ hours per day of interdisciplinary therapy in a comprehensive inpatient rehab setting. ?Physiatrist is providing close team supervision and 24 hour management of active medical problems listed below. ?Physiatrist and rehab team continue to assess barriers to discharge/monitor patient progress toward functional and medical goals ? ?Care Tool: ? ?Bathing ?   ?Body parts bathed by patient: Chest, Left arm, Right arm, Abdomen, Right upper leg, Left upper leg, Face, Front perineal area, Left lower leg, Right lower leg  ? Body parts bathed by helper: Buttocks ?  ?  ?Bathing assist Assist Level: Minimal Assistance - Patient > 75% ?  ?  ?  Upper Body Dressing/Undressing ?Upper body dressing   ?What is the patient wearing?: Pull over shirt ?   ?Upper body assist Assist Level: Moderate Assistance - Patient 50 - 74% ?   ?Lower Body Dressing/Undressing ?Lower body dressing ? ? ?   ?What is the patient wearing?: Pants ? ?  ? ?Lower body assist Assist for lower body dressing: Minimal Assistance - Patient > 75% ?   ? ?Toileting ?Toileting    ?Toileting assist Assist for toileting: Total Assistance - Patient < 25% ?  ?  ?Transfers ?Chair/bed transfer ? ?Transfers assist ?   ? ?Chair/bed transfer assist level: Contact Guard/Touching assist ?  ?  ?Locomotion ?Ambulation ? ? ?Ambulation assist ? ?   ? ?Assist level: Contact Guard/Touching assist ?Assistive device:  Bullinger-rolling ?Max distance: 23 feet  ? ?Walk 10 feet activity ? ? ?Assist ? Walk 10 feet activity did not occur: Safety/medical concerns ? ?Assist level: Contact Guard/Touching assist ?Assistive device: [place]-rolling  ? ?Walk 50 feet activity ? ? ?Assist Walk 50 feet with 2 turns activity did not occur: Safety/medical concerns ? ?  ?   ? ? ?Walk 150 feet activity ? ? ?Assist Walk 150 feet activity did not occur: Safety/medical concerns ? ?  ?  ?  ? ?Walk 10 feet on uneven surface  ?activity ? ? ?Assist Walk 10 feet on uneven surfaces activity did not occur: Safety/medical concerns ? ? ?  ?   ? ?Wheelchair ? ? ? ? ?Assist Is the patient using a wheelchair?: Yes ?Type of Wheelchair: Manual ?  ? ?Wheelchair assist level: Minimal Assistance - Patient > 75% ?Max wheelchair distance: 74  ? ? ?Wheelchair 50 feet with 2 turns activity ? ? ? ?Assist ? ?  ?  ? ? ?Assist Level: Minimal Assistance - Patient > 75%  ? ?Wheelchair 150 feet activity  ? ? ? ?Assist ?   ? ? ?Assist Level: Moderate Assistance - Patient 50 - 74%  ? ?Blood pressure 121/84, pulse 65, temperature 97.6 ?F (36.4 ?C), temperature source Oral, resp. rate 18, height 6\' 7"  (2.007 m), weight 82.1 kg, SpO2 100 %. ? ?Medical Problem List and Plan: ?1. Functional deficits secondary to heart failure/debility ?            -patient may shower ?            -ELOS/Goals: S 10-14 days ?            -Con't CIR- PT and OT ?2.  Antithrombotics: ?-DVT/anticoagulation:  Pharmaceutical: Other (comment)Eliquis 5 mg BID ?            -antiplatelet therapy: none ?3. Pain Management: Tylenol as needed ?            4/27- denies pain- con't regimen ?4. Mood: LCSW to evaluate and provide emotional support ?            -antipsychotic agents: n/a ?5. Neuropsych: This patient is capable of making decisions on his own behalf. ? -5/1 Pt reports good mood overall ?6. Skin/Wound Care: Routine skin care checks ?            -- continue local wound care to RLE with Medihoney ?            -4/30  Continue MediHoney, site covered by foam pad. ? -5/2 wound appears to be improving ?7. Fluids/Electrolytes/Nutrition: Strict Is and Os and follow-up chemistries + mag in AM ?            --  per HF team>>K+ greater than 4 and mag greater than 2 ?            4/27- K+ 4.6- and Mg 2.1- con't regimen ?            -4/30 qMonday labs to trend ?8: Nonischemic cardiomyopathy ?9: Acute on chronic biventricular heart failure (off Toprol Xl due to soft BP) ?            --daily weight ?            --continue torsemide 40 mg daily ?-- continue midodrine 15 mg 3 times daily ?-- continue amiodarone 400 mg twice daily ?--mexilitine 300 mg BID ?-- Mag-Ox 400 mg daily ? -5/2 Daily weight appears stable overall ?10: Valvular heart disease: s/p TEER MVR, still with 3+ MR ?--tricuspid regurg>>moderate on TEE ?11: Acute pulmonary embolism: ?--continue Eliquis 5 mg twice daily ?12: Acute kidney injury atop chronic kidney disease stage IIIb; serum Cr is baseline 1.4-1.7 ?            --current Cr 2.1>>follow-up BMP ?--off spironolactone ?4/27- Cr stable today at 2.11 and BUN 54- is worse than baseline- however don't want to give IVFs right now due to CHF exacerbation just got over- will encourage him to drink and monitor ?            4/28 will order repeat labs for Monday ?           -5/3 Cr increased to 2.45 Bun 61, may need decrease torsemide, will  ? Contact heart failure team to discuss ?-5/4 Pt was seen by heart failure team, appreciate assistance, torsemide held this am, consider iv fluids if remains elevated ?-5/5 Creatinine remains stable but a little elevated from prior, cardiorenal syndrome suspected by heart failure team, torsemide was restarted at 20mg  dose, continue to follow ?13: Dyslipidemia: ?-- continue Lipitor 10 mg daily ?14: Ascending aortic aneurysm ?-- follow-up CT surgery as outpatient ?15: CAD: Left heart cath with mild nonobstructive CAD, continue statin ?16: NSVT/PVCs: Goal to keep potassium greater than 4 and  magnesium greater than 2 ?--heart meds as above #9 ?--not ablation candidate ?17: Right lower extremity wound: Local wound care ?            -- Bilateral ABIs noncompressible ?             -Continue medihoney and bordered foam

## 2021-06-17 NOTE — Progress Notes (Addendum)
? ? Advanced Heart Failure Rounding Note ? ?PCP-Cardiologist: Lance Muss, MD  ? ?Subjective:   ? ?Discharged to CIR 4/26 ? ?Torsemide held yesterday. Restarted today at lower dose 20 mg daily. SCr still above baseline but stable, 2.44>>2.49>>2.45. Weight stable. ? ?SBP 100s-120 ? ?Feeling well. No dyspnea. Already worked with PT and OT this am.  ? ? ?Objective:   ?Weight Range: ?82.1 kg ?Body mass index is 20.39 kg/m?.  ? ?Vital Signs:   ?Temp:  [97.6 ?F (36.4 ?C)] 97.6 ?F (36.4 ?C) (05/04 1923) ?Pulse Rate:  [65] 65 (05/04 1923) ?Resp:  [18] 18 (05/04 1923) ?BP: (121)/(84) 121/84 (05/04 1923) ?SpO2:  [100 %] 100 % (05/04 1923) ?Last BM Date : 06/17/21 ? ?Weight change: ?Filed Weights  ? 06/13/21 0339 06/14/21 0500 06/16/21 0500  ?Weight: 81.7 kg 82.7 kg 82.1 kg  ? ? ?Intake/Output:  ? ?Intake/Output Summary (Last 24 hours) at 06/17/2021 1331 ?Last data filed at 06/17/2021 1312 ?Gross per 24 hour  ?Intake 600 ml  ?Output 1400 ml  ?Net -800 ml  ?  ? ? ?Physical Exam  ?  ?PHYSICAL EXAM: ?General:  Thin, elderly male sitting up in chair. No distress. ?HEENT: normal ?Neck: supple. no JVD. Carotids 2+ bilat; no bruits.  ?Cor: PMI nondisplaced. Regular rate & rhythm. No rubs, gallops, 2/6 MR murmur ?Lungs: clear ?Abdomen: soft, nontender, nondistended. No hepatosplenomegaly.  ?Extremities: no cyanosis, clubbing, rash, edema ?Neuro: alert & orientedx3, cranial nerves grossly intact. moves all 4 extremities w/o difficulty. Affect pleasant ? ? ? ?Telemetry  ? ?N/A  ? ?Labs  ?  ?CBC ?Recent Labs  ?  06/15/21 ?9021 06/16/21 ?0510  ?WBC 5.7 6.3  ?HGB 12.2* 12.0*  ?HCT 38.6* 36.4*  ?MCV 81.8 81.1  ?PLT 159 146*  ? ?Basic Metabolic Panel ?Recent Labs  ?  06/15/21 ?1155 06/16/21 ?0510 06/17/21 ?0801  ?NA 134* 135 136  ?K 5.2* 4.7 4.7  ?CL 97* 98 101  ?CO2 28 26 26   ?GLUCOSE 93 85 100*  ?BUN 62* 69* 70*  ?CREATININE 2.45* 2.49* 2.45*  ?CALCIUM 9.2 9.1 9.3  ?MG 2.2  --   --   ? ?Liver Function Tests ?No results for input(s):  AST, ALT, ALKPHOS, BILITOT, PROT, ALBUMIN in the last 72 hours. ? ?No results for input(s): LIPASE, AMYLASE in the last 72 hours. ?Cardiac Enzymes ?No results for input(s): CKTOTAL, CKMB, CKMBINDEX, TROPONINI in the last 72 hours. ? ?BNP: ?BNP (last 3 results) ?Recent Labs  ?  05/09/21 ?2080 05/10/21 ?0205 05/13/21 ?2142  ?BNP >4,500.0* >4,500.0* >4,500.0*  ? ? ?ProBNP (last 3 results) ?No results for input(s): PROBNP in the last 8760 hours. ? ? ?D-Dimer ?No results for input(s): DDIMER in the last 72 hours. ?Hemoglobin A1C ?No results for input(s): HGBA1C in the last 72 hours. ?Fasting Lipid Panel ?No results for input(s): CHOL, HDL, LDLCALC, TRIG, CHOLHDL, LDLDIRECT in the last 72 hours. ?Thyroid Function Tests ?No results for input(s): TSH, T4TOTAL, T3FREE, THYROIDAB in the last 72 hours. ? ?Invalid input(s): FREET3 ? ?Other results: ? ? ?Imaging  ? ? ?No results found. ? ? ?Medications:   ? ? ?Scheduled Medications: ? amiodarone  400 mg Oral BID  ? amoxicillin-clavulanate  1 tablet Oral BID  ? apixaban  5 mg Oral BID  ? atorvastatin  10 mg Oral Daily  ? feeding supplement  1 Container Oral TID BM  ? leptospermum manuka honey  1 application. Topical Daily  ? magnesium oxide  400 mg Oral Daily  ?  mexiletine  300 mg Oral Q12H  ? midodrine  15 mg Oral TID WC  ? pantoprazole  40 mg Oral Daily  ? torsemide  20 mg Oral Daily  ? ? ?Infusions: ? ? ?PRN Medications: ?acetaminophen, alum & mag hydroxide-simeth, diphenhydrAMINE, guaiFENesin-dextromethorphan, methocarbamol, prochlorperazine **OR** prochlorperazine **OR** prochlorperazine, senna-docusate, sodium phosphate, sorbitol, traMADol, traZODone ? ? ? ?Patient Profile  ? ?81 y.o. male with history of chronic biventricular HF, NICM, CAD, severe MR/TR, HTN, obesity and multiple recent admissions,  admitted w/ acute PE, a/c CHF and frequent PVCs.  ?  ?Echo, EF 25%, mildly dilated RV with moderately decreased systolic function, severe MR and TR. IVC dilated ?  ?Underwent  MitraClip 4/20.  ?Discharged to CIR 4/26  ? ?Assessment/Plan  ? ?1. Chronic Biventricular HF   ?- Recent admit for a/c CHF requiring IV Lasix and milrinone  ?- ECHO 04/2020 EF 25-30% with mild MR.   ?- Echo 04/04/21 EF LVEF 20-25%, RV moderately reduced, LA/RA severely dilated, and severe MR.  ?- Echo per Dr. 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 Not candidate for PVC ablation. S/P MTEER but still with 3+ MR.  ?- stable off milrinone.  ?- suspect he was dry given AKI/exam. Torsemide restarted today but at just 20 mg daily. Continue to monitor labs. Will need to hold if Scr trends up. ?- GDMT limited d/t hypotension and AKI. ?Continue midodrine 15 mg TID with soft BP to help w/ renal perfusion for now. If blood pressure continues to improve may be able to decrease midodrine soon. ?- Off Toprol XL with soft BP.   ?- Off Spiro w/ elevated K and SCr ?  ?2. CAD ?- LHC w/ mild non-obstructive CAD ?- denies CP ?- continue statin ?- no ASA w/ Eliquis ?- off ? blocker w/ soft BP  ?  ?3. Mitral Regurgitation ?- Severe on echo 02/23 ?- TEE showed mild restriction of P2. Severe central/posterior MR due to P2 restriction and annular dilation ?- functional, severely dilated LA and LV  ?- S/P MTEER => still with 3+ MR.  ?  ?4. Tricuspid Regurgitation  ?- moderate on TEE  ?- functional, severely dilated RA and RV   ?- HF optimization per above  ?  ?5. Acute PE ?- On Eliquis.  ?- No evidence of DVT on [place] ?- ? 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  ?6. Hyperlipidemia ?- Continue statin ?  ?8. AKI on CKD IIIa ?-  Previous Scr  baseline 1.4-1.7. Last few months ~ 2.1  ?- Suspect cardiorenal syndrome. ?- SCr trending back up, 2.1>>2.4>2.4. Stable today at 2.4  ?- Watch renal function with restarting Torsemide ?  ?9. NSVT/PVCs ?- recent high burden, suspect contributing to CM  ?- Slightly improved post MTEER ?- continue mexiletine 300 mg BID  ?- continue amiodarone 400 mg bid for now but will ultimately need dose reduction  ?- Keep K > 4 and Mag > 2 ?- per EP, he is not an ablation candidate ?  ? 10. Debility ?- c/w therapy ?- appreciate CIR's assistance  ? ?11. Hyperkalemia ?-  corrected w/ lokelma, 5.2>>4.7>>4.9 ?- off spiro ?- follow BMP  ? ?Length of Stay: 9 ? ?FINCH, LINDSAY N, PA-C  ?06/17/2021, 1:31 PM ? ?Advanced Heart Failure Team ?Pager [phone] (M-F; 7a - 5p)  ?Please contact [facility] for night-coverage after hours (5p -7a ) and weekends on [email or web address] ?  ? ?Patient seen and examined with the above-signed Advanced Practice Provider and/or Housestaff. I personally reviewed laboratory data, imaging studies and relevant notes. I independently examined the patient and formulated the important aspects of the plan. I have edited the note to reflect any of my changes or salient points. I have personally discussed the plan with the patient and/or family. ? ?Off diuretics. Denies SOB, orthopnea or PND. Scr stable.  ? ?General:  Sitting in chair No resp difficulty ?HEENT: normal ?Neck: supple. no JVD. Carotids 2+ bilat; no bruits. No lymphadenopathy or thryomegaly appreciated. ?Cor: PMI nondisplaced. Regular rate & rhythm.2/6 MR ?Lungs: clear ?Abdomen: obese soft, nontender, nondistended. No hepatosplenomegaly. No bruits or masses. Good bowel sounds. ?Extremities: no cyanosis, clubbing, rash, edema ?Neuro: alert & orientedx3, cranial nerves grossly intact. moves all 4 extremities w/o difficulty. Affect pleasant ? ?Renal function improving. Torsemide restarted at low dose. Will follow. May need to drop back.  ? ?Glori Bickers, MD  ?7:49 PM ? ? ? ? ? ?

## 2021-06-17 NOTE — Progress Notes (Signed)
Occupational Therapy Weekly Progress Note ? ?Patient Details  ?Name: Matthew [name] ?MRN: [id number] ?Date of Birth: August 22, 1940 ? ?Beginning of progress report period: June 09, 2021 ?End of progress report period: Jun 17, 2021 ? ?Patient has met 3 of 4 short term goals.  Pt is making progress with his mobility and self care. He is now at a CGA to min A level with time needed for processing and often requires extra cuing.   ? ?Patient continues to demonstrate the following deficits: muscle weakness, muscle joint tightness, and severe arthritis, decreased cardiorespiratoy endurance, decreased motor planning, decreased problem solving and delayed processing, and decreased sitting balance, decreased standing balance, decreased postural control, and decreased balance strategies and therefore will continue to benefit from skilled OT intervention to enhance overall performance with BADL. ? ?Patient progressing toward long term goals..  Continue plan of care. ? ?OT Short Term Goals ?Week 1:  OT Short Term Goal 1 (Week 1): pt will consistently stand from wc with CGA to RW ?OT Short Term Goal 1 - Progress (Week 1): Met ?OT Short Term Goal 2 (Week 1): Pt will demonstrate improved perception and motor planning to don a shirt with set up. ?OT Short Term Goal 2 - Progress (Week 1): Progressing toward goal ?OT Short Term Goal 3 (Week 1): Pt will complete toilet transfer with min A. ?OT Short Term Goal 3 - Progress (Week 1): Met ?OT Short Term Goal 4 (Week 1): Pt will don pants with mod A. ?OT Short Term Goal 4 - Progress (Week 1): Met ?Week 2:  OT Short Term Goal 1 (Week 2): STGs = LTGs ? ? ?Therapy Documentation ?Precautions:  ?Precautions ?Precautions: Fall ?Precaution Comments: L radial heart cath on 4/5 ?Restrictions ?Weight Bearing Restrictions: No ?Pain: ?Pain Assessment ?Pain Scale: 0-10 ?Pain Score: 0-No pain ?ADL: ?ADL ?Eating: Set up ?Grooming: Minimal cueing, Minimal assistance ?Upper Body Bathing:  Supervision/safety ?Where Assessed-Upper Body Bathing: Wheelchair ?Lower Body Bathing: Moderate assistance ?Where Assessed-Lower Body Bathing: Wheelchair ?Upper Body Dressing: Moderate assistance ?Where Assessed-Upper Body Dressing: Wheelchair ?Lower Body Dressing: Maximal assistance ?Where Assessed-Lower Body Dressing: Wheelchair ?Toileting: Maximal assistance ?Where Assessed-Toileting: Bedside Commode ?Toilet Transfer: Moderate assistance ?Toilet Transfer Method: Stand pivot ? ? ?Therapy/Group: Individual Therapy ? ?[place] ?06/17/2021, 8:29 AM  ?

## 2021-06-17 NOTE — Progress Notes (Signed)
Occupational Therapy Session Note ? ?Patient Details  ?Name: Matthew [name] ?MRN: [id number] ?Date of Birth: 12/27/1940 ? ?Today's Date: 06/17/2021 ?OT Individual Time: [id number] ?OT Individual Time Calculation (min): 55 min  ? ? ?Short Term Goals: ?Week 1:  OT Short Term Goal 1 (Week 1): pt will consistently stand from wc with CGA to RW ?OT Short Term Goal 1 - Progress (Week 1): Met ?OT Short Term Goal 2 (Week 1): Pt will demonstrate improved perception and motor planning to don a shirt with set up. ?OT Short Term Goal 2 - Progress (Week 1): Progressing toward goal ?OT Short Term Goal 3 (Week 1): Pt will complete toilet transfer with min A. ?OT Short Term Goal 3 - Progress (Week 1): Met ?OT Short Term Goal 4 (Week 1): Pt will don pants with mod A. ?OT Short Term Goal 4 - Progress (Week 1): Met ? ?Skilled Therapeutic Interventions/Progress Updates:  ?   ?Pt received in bed with no pain and agreeable to OT. ? ?ADL: ?Pt completes ADL at overall CGA Level. Skilled interventions include:  A for orientation of clothing to pt for both UB and LB clothing, increased time to process cuing, cuing for sequence for bathing for thoroughness, and CGA for SPT/functional mobility in room ?Therapeutic activity ?Pt completes 5x40 sec on/40 seconds off of boxing for generalized conditioning and trunk control EOM with no LE or back support. Pt with no LOB during activity, but unable ot follow simple pattern of punches besides alternating R/L ? ?Pt left at end of session in w/c with direct handoff to next PT ? ?Therapy Documentation ?Precautions:  ?Precautions ?Precautions: Fall ?Precaution Comments: L radial heart cath on 4/5 ?Restrictions ?Weight Bearing Restrictions: No ?General: ?  ?Vital Signs: ?  ?Pain: ? ? ?Therapy/Group: individual ? ?Lowella Dell Bonniejean Piano ?06/17/2021, 6:51 AM ?

## 2021-06-17 NOTE — Progress Notes (Signed)
Physical Therapy Session Note ? ?Patient Details  ?Name: Matthew [name] ?MRN: [id number] ?Date of Birth: 1941/01/07 ? ?Today's Date: 06/17/2021 ?PT Individual Time: 4128-7867 ?PT Individual Time Calculation (min): 70 min  ? ?Short Term Goals: ?Week 2:  PT Short Term Goal 1 (Week 2): STG=LTG based on ELOS ? ?Skilled Therapeutic Interventions/Progress Updates:  ?  Patient received sitting up in wc in therapy gym, handoff from OT. He denies pain. He ambulated 54ft with RW and CGA/MinA to therapy mat. He was able to assume supine with supervision. Patient attempting bridging, but able to complete very small ROM due to increased hip flexion tightness and decreased posterior chain strength. Modified thomas stretch for improved hip ROM- patient tolerated well. He had significant difficulty coordinating heel slides B demonstrating poor motor planning and poor rapid alternating movement. Patient ambulating back to wc with RW and MinA. He was able to propel himself 15ft using B UE and supervision, but initially hand over hand assist to initiate. Patient transferring to NuStep with MinA and RW. X10 mins completed for large amplitude reciprocal movement. Patient propelling himself in wc back to his room using B UE and supervision. Patient remaining up in chair, seatbelt alarm on, call light within reach.  ? ? ?Therapy Documentation ?Precautions:  ?Precautions ?Precautions: Fall ?Precaution Comments: L radial heart cath on 4/5 ?Restrictions ?Weight Bearing Restrictions: No ? ? ?Therapy/Group: Individual Therapy ? ?Westley Foots ?Westley Foots, PT, DPT, CBIS ? ?06/17/2021, 7:54 AM  ?

## 2021-06-17 NOTE — Progress Notes (Signed)
Physical Therapy Session Note ? ?Patient Details  ?Name: Matthew [name] ?MRN: [id number] ?Date of Birth: 11/13/1940 ? ?Today's Date: 06/17/2021 ?PT Individual Time: 0321-0408 ?PT Individual Time Calculation (min): 47 min  ? ?Short Term Goals: ?Week 1:  PT Short Term Goal 1 (Week 1): Patient will complete bed mobility with MinA ?PT Short Term Goal 1 - Progress (Week 1): Met ?PT Short Term Goal 2 (Week 1): Patient will complete sit <> stand with LRAD and ModA consistently ?PT Short Term Goal 2 - Progress (Week 1): Met ?PT Short Term Goal 3 (Week 1): Patient will ambulate >10ft with LRAD and ModA ?PT Short Term Goal 3 - Progress (Week 1): Met ?Week 2:  PT Short Term Goal 1 (Week 2): STG=LTG based on ELOS ? ?Skilled Therapeutic Interventions/Progress Updates:  ?Patient seated upright in w/c  on entrance to room. Patient alert and agreeable to PT session.  ? ?Patient with no pain complaint throughout session. Fatigue noted during longer standing bouts.  ? ?Therapeutic Activity: ?Transfers: Patient performed sit<>stand and stand pivot transfers throughout session with CGA/supervision. Provided min verbal cues for technique/ effort. ? ?Neuromuscular Re-ed: ?NMR facilitated during session with focus on standing balance and strength/ activity tolerance. Pt guided in bean bag toss to target placed ~10 feet away. Pt is able to improve accuracy from first to second bouts. Maintains stance with slow fade to increased bodily flexion requiring vc throughout for more upright posture and bodily extension. Requires one brief seated rest break after 10 bag tosses during each bout. No LOB throughout. Pt improves to take dynamic step with limited underhand toss.  NMR performed for improvements in motor control and coordination, balance, sequencing, judgement, and self confidence/ efficacy in performing all aspects of mobility at highest level of independence.  ? ?Gait Training:  ?Patient ambulated 80' x1/ 70' x1 ft using RW with CGA.  Demonstrated very slow pace and flexed posture. Provided vc/ tc for energy conservation and need to sit to rest in order to complete full distance.  ? ?Wheelchair Mobility:  ?Patient propelled wheelchair 60 feet using BUE with supervision for straight path and hand over hand assist for adequate turning in place and very slow to propel around corner. Provided vc for effort. ? ?Patient seated upright  in w/c at end of session with brakes locked, belt alarm set, and all needs within reach.  ? ?Therapy Documentation ?Precautions:  ?Precautions ?Precautions: Fall ?Precaution Comments: L radial heart cath on 4/5 ?Restrictions ?Weight Bearing Restrictions: No ?General: ?  ?Vital Signs: ? ?Pain: ? No pain complaint from pt this session.  ? ?Therapy/Group: Individual Therapy ? ?Julie A Kraus PT, DPT ?06/17/2021, 6:45 PM  ?

## 2021-06-18 DIAGNOSIS — E875 Hyperkalemia: Secondary | ICD-10-CM

## 2021-06-18 LAB — BASIC METABOLIC PANEL
Anion gap: 9 (ref 5–15)
BUN: 65 mg/dL — ABNORMAL HIGH (ref 8–23)
CO2: 25 mmol/L (ref 22–32)
Calcium: 9.1 mg/dL (ref 8.9–10.3)
Chloride: 103 mmol/L (ref 98–111)
Creatinine, Ser: 2.3 mg/dL — ABNORMAL HIGH (ref 0.61–1.24)
GFR, Estimated: 28 mL/min — ABNORMAL LOW (ref >60)
Glucose, Bld: 84 mg/dL (ref 70–99)
Potassium: 4.3 mmol/L (ref 3.5–5.1)
Sodium: 137 mmol/L (ref 135–145)

## 2021-06-18 NOTE — Progress Notes (Signed)
Physical Therapy Discharge Summary ? ?Patient Details  ?Name: Matthew [name] ?MRN: [id number] ?Date of Birth: 10/28/1940 ? ?Patient has met 10 of 10 long term goals due to improved activity tolerance, improved balance, improved postural control, increased strength, decreased pain, and ability to compensate for deficits.  Patient to discharge at an ambulatory level  CGA .   Patient's care partner  and family members  to provide the necessary physical and cognitive assistance at discharge. Family training completed on Sunday 5/7 with daughter Joanna - recommended 24/7 care due to fall risk and cognitive deficits.  ? ?Reasons goals not met: n/a ? ?Recommendation:  ?Patient will benefit from ongoing skilled PT services in home health setting to continue to advance safe functional mobility, address ongoing impairments in balance, transfers, gait, posture, and minimize fall risk. ? ?Equipment: ?RW, 18x20 wc, cushion, leg rests, anti tippers ? ?Reasons for discharge: treatment goals met and discharge from hospital ? ?Patient/family agrees with progress made and goals achieved: Yes ? ?PT Discharge ?Precautions/Restrictions ?Precautions ?Precautions: Fall ?Precaution Comments: L radial heart cath on 4/5, kyphotic ?Restrictions ?Weight Bearing Restrictions: No ?Vital Signs ?Therapy Vitals ?Temp: 97.7 ?F (36.5 ?C) ?Temp Source: Oral ?Pulse Rate: (!) 57 ?Resp: 18 ?BP: 105/72 ?Patient Position (if appropriate): Lying ?Oxygen Therapy ?SpO2: 100 % ?O2 Device: Room Air ?Pain ?  Reports no pain ?Pain Interference ?Pain Interference ?Pain Effect on Sleep: 0. Does not apply - I have not had any pain or hurting in the past 5 days ?Pain Interference with Therapy Activities: 0. Does not apply - I have not received rehabilitationtherapy in the past 5 days ?Pain Interference with Day-to-Day Activities: 1. Rarely or not at all ?Vision/Perception  ?Vision - History ?Ability to See in Adequate Light: 0 Adequate ?Perception ?Perception:  Impaired ?Praxis ?Praxis: Impaired ?Praxis Impairment Details: Motor planning;Initiation  ?Cognition ?Overall Cognitive Status: History of cognitive impairments - at baseline ?Arousal/Alertness: Awake/alert ?Sustained Attention: Impaired ?Memory: Impaired ?Awareness: Impaired ?Problem Solving: Impaired ?Safety/Judgment: Impaired ?Sensation ?Sensation ?Light Touch: Appears Intact ?Hot/Cold: Appears Intact ?Proprioception: Appears Intact ?Stereognosis: Appears Intact ?Coordination ?Gross Motor Movements are Fluid and Coordinated: No ?Fine Motor Movements are Fluid and Coordinated: No ?Coordination and Movement Description: dysdiadochokinesia apparent ?Finger Nose Finger Test: very slow on R, difficulty with accuracy on the L ?Heel Shin Test: Limited AROM B LE ?Motor  ?Motor ?Motor - Skilled Clinical Observations: limited due to arthritis, generalized weakness ?Motor - Discharge Observations: generalized weakness, arthritis, poor motor planning  ?Mobility ?Bed Mobility ?Bed Mobility: Rolling Right;Rolling Left;Supine to Sit;Sit to Supine ?Rolling Right: Independent ?Rolling Left: Independent ?Supine to Sit: Supervision/Verbal cueing ?Sit to Supine: Supervision/Verbal cueing ?Transfers ?Transfers: Sit to Stand;Stand Pivot Transfers;Stand to Sit ?Sit to Stand: Contact Guard/Touching assist ?Stand to Sit: Contact Guard/Touching assist ?Stand Pivot Transfers: Contact Guard/Touching assist ?Transfer (Assistive device): Rolling Rabold ?Locomotion  ?Gait ?Ambulation: Yes ?Gait Assistance: Contact Guard/Touching assist ?Gait Distance (Feet): 100 Feet ?Assistive device: Rolling Heming ?Gait Assistance Details: Verbal cues for sequencing;Verbal cues for technique;Verbal cues for safe use of DME/AE;Verbal cues for gait pattern;Verbal cues for precautions/safety ?Gait ?Gait: Yes ?Gait Pattern: Impaired ?Gait Pattern: Step-to pattern;Shuffle;Trunk flexed;Scissoring;Left flexed knee in stance;Right flexed knee in stance;Narrow base  of support (crouched and extremly kyphotic. Over pronation on L > R) ?Gait velocity: decr ?Stairs / Additional Locomotion ?Stairs: Yes ?Stairs Assistance: Minimal Assistance - Patient > 75% ?Stair Management Technique: Two rails;Step to pattern;Forwards ?Number of Stairs: 4 ?Height of Stairs: 6 ?Wheelchair Mobility ?Wheelchair Mobility: Yes ?Wheelchair Assistance: Supervision/Verbal cueing ?  Wheelchair Propulsion: Both upper extremities ?Wheelchair Parts Management: Supervision/cueing ?Distance: 150ft  ?Trunk/Postural Assessment  ?Cervical Assessment ?Cervical Assessment: Exceptions to WFL ?Thoracic Assessment ?Thoracic Assessment: Exceptions to WFL ?Lumbar Assessment ?Lumbar Assessment: Exceptions to WFL ?Postural Control ?Postural Control: Deficits on evaluation  ?Balance ?Balance ?Balance Assessed: Yes ?Standardized Balance Assessment ?Standardized Balance Assessment: Timed Up and Go Test ?Timed Up and Go Test ?TUG: Normal TUG (with CGA and RW) ?Normal TUG (seconds): 51 (AVG of 3 trials (62, 46, 45s)) ?Static Sitting Balance ?Static Sitting - Level of Assistance: 5: Stand by assistance ?Dynamic Sitting Balance ?Dynamic Sitting - Level of Assistance: 5: Stand by assistance ?Static Standing Balance ?Static Standing - Level of Assistance: 5: Stand by assistance ?Dynamic Standing Balance ?Dynamic Standing - Level of Assistance: Other (comment) (CGA) ?Extremity Assessment  ?  ?  ?RLE Assessment ?RLE Assessment: Exceptions to WFL ?RLE Strength ?Right Hip Flexion: 2/5 ?Right Hip ABduction: 2+/5 ?Right Hip ADduction: 2+/5 ?Right Knee Flexion: 2/5 ?Right Knee Extension: 3-/5 ?Right Ankle Dorsiflexion: 3-/5 ?Right Ankle Plantar Flexion: 3-/5 ?LLE Assessment ?LLE Assessment: Exceptions to WFL ?LLE Strength ?Left Hip Flexion: 2+/5 ?Left Hip ABduction: 2+/5 ?Left Hip ADduction: 2+/5 ?Left Knee Flexion: 2+/5 ?Left Knee Extension: 3-/5 ?Left Ankle Dorsiflexion: 3-/5 ?Left Ankle Plantar Flexion: 3-/5 ? ? ?Christian Manhard PT,  DPT ?Jennifer A Novak PT, DPT ?06/18/2021, 3:51 PM ?

## 2021-06-18 NOTE — Progress Notes (Signed)
Physical Therapy Session Note ? ?Patient Details  ?Name: Matthew [name] ?MRN: [id number] ?Date of Birth: September 05, 1940 ? ?Today's Date: 06/18/2021 ?PT Individual Time: 1761-6073 ?PT Individual Time Calculation (min): 55 min  ? ?Short Term Goals: ?Week 2:  PT Short Term Goal 1 (Week 2): STG=LTG based on ELOS ? ?Skilled Therapeutic Interventions/Progress Updates:  ?  Patient received sitting up in wc, agreeable to PT. He denies pain. PT transporting patient in wc to therapy gym for time management and energy conservation. He completed the following therex: LAQ, HSC, marches, hip abd. He continues to require consistent verbal cues and demonstration for form due to poor coordination and motor planning. Patient ambulating 16ft x2 with seated rest break between. Using RW and CGA. Up to [facility] when turning due to poor safety awareness. He propelled himself in wc back to his room with supervision. Patient requesting to have male purewick set up- he did not have the attachment on currently. Per NT, Purewick is only supposed to be used at night. PT attempting to explain that to the patient that the Purewick is for night and to use the call light + urinal as needed. Patient becoming very frustrated requesting to speak to RN. PT alerted RN. Seatbelt alarm on, call light within reach.  ? ?Therapy Documentation ?Precautions:  ?Precautions ?Precautions: Fall ?Precaution Comments: L radial heart cath on 4/5 ?Restrictions ?Weight Bearing Restrictions: No ? ? ? ? ?Therapy/Group: Individual Therapy ? ?Westley Foots ?Westley Foots, PT, DPT, CBIS ? ?06/18/2021, 7:44 AM  ?

## 2021-06-18 NOTE — Progress Notes (Signed)
?                                                       PROGRESS NOTE ? ? ?Subjective/Complaints:    ?No problems today. Denies pain.  ? ?ROS: Patient denies fever, rash, sore throat, blurred vision, dizziness, nausea, vomiting, diarrhea, cough, shortness of breath or chest pain, joint or back/neck pain, headache, or mood change.  ? ?  ? ?Objective: ?  ?No results found. ?Recent Labs  ?  06/16/21 ?0510  ?WBC 6.3  ?HGB 12.0*  ?HCT 36.4*  ?PLT 146*  ? ?Recent Labs  ?  06/17/21 ?0801 06/18/21 ?[id number]  ?NA 136 137  ?K 4.7 4.3  ?CL 101 103  ?CO2 26 25  ?GLUCOSE 100* 84  ?BUN 70* 65*  ?CREATININE 2.45* 2.30*  ?CALCIUM 9.3 9.1  ? ? ?Intake/Output Summary (Last 24 hours) at 06/18/2021 1133 ?Last data filed at 06/18/2021 0900 ?Gross per 24 hour  ?Intake 700 ml  ?Output 800 ml  ?Net -100 ml  ?  ? ?Pressure Injury 06/03/21 Sacrum Left Stage 2 -  Partial thickness loss of dermis presenting as a shallow open injury with a red, pink wound bed without slough. (Active)  ?06/03/21 1800  ?Location: Sacrum  ?Location Orientation: Left  ?Staging: Stage 2 -  Partial thickness loss of dermis presenting as a shallow open injury with a red, pink wound bed without slough.  ?Wound Description (Comments):   ?Present on Admission:   ?   ?Pressure Injury 06/08/21 Sacrum Right Stage 2 -  Partial thickness loss of dermis presenting as a shallow open injury with a red, pink wound bed without slough. (Active)  ?06/08/21 1755  ?Location: Sacrum  ?Location Orientation: Right  ?Staging: Stage 2 -  Partial thickness loss of dermis presenting as a shallow open injury with a red, pink wound bed without slough.  ?Wound Description (Comments):   ?Present on Admission: Yes  ? ? ?Physical Exam: ?Vital Signs ?Blood pressure (!) 90/55, pulse (!) 52, temperature 97.8 ?F (36.6 ?C), temperature source Oral, resp. rate 17, height 6\' 7"  (2.007 m), weight 82.1 kg, SpO2 100 %. ? ?Constitutional: No distress . Vital signs reviewed. ?HEENT: NCAT, EOMI, oral membranes  moist ?Neck: supple ?Cardiovascular: RRR without murmur. No JVD    ?Respiratory/Chest: CTA Bilaterally without wheezes or rales. Normal effort    ?GI/Abdomen: BS +, non-tender, non-distended ?Ext: no clubbing, cyanosis, or edema ?Psych: pleasant and cooperative  ?Neurological: alert and oriented, respond appropriately.  Moving all 4 extremities. CN grossly intact but HOH ?Tone normal. Normal sensory ?Ext: no clubbing, cyanosis, or edema ?Psych: pleasant and cooperative , follows commands ? ?Skin: ?Comments: Small skin ulcer on RLE covered in bordered foam dressing ? ?Assessment/Plan: ?1. Functional deficits which require 3+ hours per day of interdisciplinary therapy in a comprehensive inpatient rehab setting. ?Physiatrist is providing close team supervision and 24 hour management of active medical problems listed below. ?Physiatrist and rehab team continue to assess barriers to discharge/monitor patient progress toward functional and medical goals ? ?Care Tool: ? ?Bathing ?   ?Body parts bathed by patient: Chest, Left arm, Right arm, Abdomen, Right upper leg, Left upper leg, Face, Front perineal area, Left lower leg, Right lower leg  ? Body parts bathed by helper: Buttocks ?  ?  ?Bathing assist Assist Level:  Minimal Assistance - Patient > 75% ?  ?  ?Upper Body Dressing/Undressing ?Upper body dressing   ?What is the patient wearing?: Pull over shirt ?   ?Upper body assist Assist Level: Moderate Assistance - Patient 50 - 74% ?   ?Lower Body Dressing/Undressing ?Lower body dressing ? ? ?   ?What is the patient wearing?: Pants ? ?  ? ?Lower body assist Assist for lower body dressing: Minimal Assistance - Patient > 75% ?   ? ?Toileting ?Toileting    ?Toileting assist Assist for toileting: Total Assistance - Patient < 25% ?  ?  ?Transfers ?Chair/bed transfer ? ?Transfers assist ?   ? ?Chair/bed transfer assist level: Contact Guard/Touching assist ?  ?  ?Locomotion ?Ambulation ? ? ?Ambulation assist ? ?   ? ?Assist level:  Contact Guard/Touching assist ?Assistive device: [place]-rolling ?Max distance: 23 feet  ? ?Walk 10 feet activity ? ? ?Assist ? Walk 10 feet activity did not occur: Safety/medical concerns ? ?Assist level: Contact Guard/Touching assist ?Assistive device: [place]-rolling  ? ?Walk 50 feet activity ? ? ?Assist Walk 50 feet with 2 turns activity did not occur: Safety/medical concerns ? ?  ?   ? ? ?Walk 150 feet activity ? ? ?Assist Walk 150 feet activity did not occur: Safety/medical concerns ? ?  ?  ?  ? ?Walk 10 feet on uneven surface  ?activity ? ? ?Assist Walk 10 feet on uneven surfaces activity did not occur: Safety/medical concerns ? ? ?  ?   ? ?Wheelchair ? ? ? ? ?Assist Is the patient using a wheelchair?: Yes ?Type of Wheelchair: Manual ?  ? ?Wheelchair assist level: Minimal Assistance - Patient > 75% ?Max wheelchair distance: 52  ? ? ?Wheelchair 50 feet with 2 turns activity ? ? ? ?Assist ? ?  ?  ? ? ?Assist Level: Minimal Assistance - Patient > 75%  ? ?Wheelchair 150 feet activity  ? ? ? ?Assist ?   ? ? ?Assist Level: Moderate Assistance - Patient 50 - 74%  ? ?Blood pressure (!) 90/55, pulse (!) 52, temperature 97.8 ?F (36.6 ?C), temperature source Oral, resp. rate 17, height 6\' 7"  (2.007 m), weight 82.1 kg, SpO2 100 %. ? ?Medical Problem List and Plan: ?1. Functional deficits secondary to heart failure/debility ?            -patient may shower ?            -ELOS/Goals: S 10-14 days ?            -Continue CIR therapies including PT, OT  ?2.  Antithrombotics: ?-DVT/anticoagulation:  Pharmaceutical: Other (comment)Eliquis 5 mg BID ?            -antiplatelet therapy: none ?3. Pain Management: Tylenol as needed ?            4/27- denies pain- con't regimen ?4. Mood: LCSW to evaluate and provide emotional support ?            -antipsychotic agents: n/a ?5. Neuropsych: This patient is capable of making decisions on his own behalf. ? -5/1 Pt reports good mood overall ?6. Skin/Wound Care: Routine skin care checks ?             -- continue local wound care to RLE with Medihoney ?            -4/30 Continue MediHoney, site covered by foam pad. ? -5/2 wound appears to be improving ?7. Fluids/Electrolytes/Nutrition: Strict Is and Os and follow-up chemistries +  mag in AM ?            --per HF team>>K+ greater than 4 and mag greater than 2 ?            4/27- K+ 4.6- and Mg 2.1- con't regimen ?            -4/30 qMonday labs to trend ?8: Nonischemic cardiomyopathy ?9: Acute on chronic biventricular heart failure (off Toprol Xl due to soft BP) ?            --daily weight ?            --continue torsemide 40 mg daily ?-- continue midodrine 15 mg 3 times daily ?-- continue amiodarone 400 mg twice daily ?--mexilitine 300 mg BID ?-- Mag-Ox 400 mg daily ?  ?Filed Weights  ? 06/13/21 0339 06/14/21 0500 06/16/21 0500  ?Weight: 81.7 kg 82.7 kg 82.1 kg  ? 5/6 weights balanced ?10: Valvular heart disease: s/p TEER MVR, still with 3+ MR ?--tricuspid regurg>>moderate on TEE ?11: Acute pulmonary embolism: ?--continue Eliquis 5 mg twice daily ?12: Acute kidney injury atop chronic kidney disease stage IIIb; serum Cr is baseline 1.4-1.7 ?            --current Cr 2.1>>follow-up BMP ?--off spironolactone ?4/27- Cr stable today at 2.11 and BUN 54- is worse than baseline- however don't want to give IVFs right now due to CHF exacerbation just got over- will encourage him to drink and monitor ?            4/28 will order repeat labs for Monday ?           -5/3 Cr increased to 2.45 Bun 61, may need decrease torsemide, will  ? Contact heart failure team to discuss ?-5/4 Pt was seen by heart failure team, appreciate assistance, torsemide held this am, consider iv fluids if remains elevated ?-5/6 Creatinine to sl improved, cardiorenal syndrome suspected by heart failure team, torsemide was restarted at 20mg  dose ? -continue to follow serially ?13: Dyslipidemia: ?-- continue Lipitor 10 mg daily ?14: Ascending aortic aneurysm ?-- follow-up CT surgery as outpatient ?15:  CAD: Left heart cath with mild nonobstructive CAD, continue statin ?16: NSVT/PVCs: Goal to keep potassium greater than 4 and magnesium greater than 2 ?--heart meds as above #9 ?--not ablation candidate ?Petronila

## 2021-06-18 NOTE — Progress Notes (Signed)
Occupational Therapy Session Note ? ?Patient Details  ?Name: CALYN RUBI ?MRN: [id number] ?Date of Birth: [birth year] ? ?Today's Date: 06/18/2021 ?OT Individual Time: 5400-8676 ?OT Individual Time Calculation (min): 40 min  ? ? ?Short Term Goals: ?Week 1:  OT Short Term Goal 1 (Week 1): pt will consistently stand from wc with CGA to RW ?OT Short Term Goal 1 - Progress (Week 1): Met ?OT Short Term Goal 2 (Week 1): Pt will demonstrate improved perception and motor planning to don a shirt with set up. ?OT Short Term Goal 2 - Progress (Week 1): Progressing toward goal ?OT Short Term Goal 3 (Week 1): Pt will complete toilet transfer with min A. ?OT Short Term Goal 3 - Progress (Week 1): Met ?OT Short Term Goal 4 (Week 1): Pt will don pants with mod A. ?OT Short Term Goal 4 - Progress (Week 1): Met ?Week 2:  OT Short Term Goal 1 (Week 2): STGs = LTGs ? ? ?Skilled Therapeutic Interventions/Progress Updates:  ?  Pt greeted at time of session semireclined bed level, agreeable to OT session and no pain reported. Note pt with male purewick on, spoke with nursing and OK to remove. Extended time to remove purewick in prep for OOB activity. Supine > sit Supervision and OT placing new brief in form of pull up, pt sit > stand CGA to pull up but eventually needing MOD A overall to don over back side. Stand pivot bed > wheelchair CGA/MIN with RW and transported to gym. Attempted standing balance based activity for reaching up to decrease kyphotic posture and improve upright positioning but unable to complete clip task in standing. Switched to seated and performed 1# weighted ball lifts to promote shoulder flexion and torso twists for 1x10 each. Transported back to room and set up alarm on call bell in reach. ? ?Therapy Documentation ?Precautions:  ?Precautions ?Precautions: Fall ?Precaution Comments: L radial heart cath on 4/5 ?Restrictions ?Weight Bearing Restrictions: No ? ? ? ? ?Therapy/Group: Individual Therapy ? ?Viona Gilmore ?06/18/2021, 7:18 AM ?

## 2021-06-19 LAB — BASIC METABOLIC PANEL
Anion gap: 9 (ref 5–15)
BUN: 64 mg/dL — ABNORMAL HIGH (ref 8–23)
CO2: 27 mmol/L (ref 22–32)
Calcium: 9.4 mg/dL (ref 8.9–10.3)
Chloride: 103 mmol/L (ref 98–111)
Creatinine, Ser: 2.26 mg/dL — ABNORMAL HIGH (ref 0.61–1.24)
GFR, Estimated: 29 mL/min — ABNORMAL LOW (ref >60)
Glucose, Bld: 86 mg/dL (ref 70–99)
Potassium: 4.9 mmol/L (ref 3.5–5.1)
Sodium: 139 mmol/L (ref 135–145)

## 2021-06-19 NOTE — Progress Notes (Signed)
?                                                       PROGRESS NOTE ? ? ?Subjective/Complaints:    ?Pt up in bed. Just finished breakfast. Feeling well. No new complaints. Wears size 16 shoes!!! ? ?ROS: Patient denies fever, rash, sore throat, blurred vision, dizziness, nausea, vomiting, diarrhea, cough, shortness of breath or chest pain, joint or back/neck pain, headache, or mood change.  ? ?  ? ?Objective: ?  ?No results found. ?No results for input(s): WBC, HGB, HCT, PLT in the last 72 hours. ? ?Recent Labs  ?  06/18/21 ?[id number] 06/19/21 ?[id number]  ?NA 137 139  ?K 4.3 4.9  ?CL 103 103  ?CO2 25 27  ?GLUCOSE 84 86  ?BUN 65* 64*  ?CREATININE 2.30* 2.26*  ?CALCIUM 9.1 9.4  ? ? ?Intake/Output Summary (Last 24 hours) at 06/19/2021 1057 ?Last data filed at 06/19/2021 0700 ?Gross per 24 hour  ?Intake 710 ml  ?Output 1400 ml  ?Net -690 ml  ?  ? ?Pressure Injury 06/03/21 Sacrum Left Stage 2 -  Partial thickness loss of dermis presenting as a shallow open injury with a red, pink wound bed without slough. (Active)  ?06/03/21 1800  ?Location: Sacrum  ?Location Orientation: Left  ?Staging: Stage 2 -  Partial thickness loss of dermis presenting as a shallow open injury with a red, pink wound bed without slough.  ?Wound Description (Comments):   ?Present on Admission:   ?   ?Pressure Injury 06/08/21 Sacrum Right Stage 2 -  Partial thickness loss of dermis presenting as a shallow open injury with a red, pink wound bed without slough. (Active)  ?06/08/21 1755  ?Location: Sacrum  ?Location Orientation: Right  ?Staging: Stage 2 -  Partial thickness loss of dermis presenting as a shallow open injury with a red, pink wound bed without slough.  ?Wound Description (Comments):   ?Present on Admission: Yes  ? ? ?Physical Exam: ?Vital Signs ?Blood pressure 104/60, pulse (!) 57, temperature 98.1 ?F (36.7 ?C), temperature source Oral, resp. rate 17, height 6\' 7"  (2.007 m), weight 82.1 kg, SpO2 100 %. ? ?Constitutional: No distress . Vital signs  reviewed. ?HEENT: NCAT, EOMI, oral membranes moist ?Neck: supple ?Cardiovascular: RRR without murmur. No JVD    ?Respiratory/Chest: CTA Bilaterally without wheezes or rales. Normal effort    ?GI/Abdomen: BS +, non-tender, non-distended ?Ext: no clubbing, cyanosis, or edema ?Psych: pleasant and cooperative  ?Neurological: alert and oriented, respond appropriately.  Moving all 4 extremities. CN grossly intact but HOH, dysarthric ?Tone normal. Normal sensory ?Ext: no clubbing, cyanosis, or edema ?Psych: pleasant and cooperative , follows commands ? ?Skin: ?Comments: Small skin ulcer on RLE covered in bordered foam dressing ? ?Assessment/Plan: ?1. Functional deficits which require 3+ hours per day of interdisciplinary therapy in a comprehensive inpatient rehab setting. ?Physiatrist is providing close team supervision and 24 hour management of active medical problems listed below. ?Physiatrist and rehab team continue to assess barriers to discharge/monitor patient progress toward functional and medical goals ? ?Care Tool: ? ?Bathing ?   ?Body parts bathed by patient: Chest, Left arm, Right arm, Abdomen, Right upper leg, Left upper leg, Face, Front perineal area, Left lower leg, Right lower leg  ? Body parts bathed by helper: Buttocks ?  ?  ?Bathing  assist Assist Level: Minimal Assistance - Patient > 75% ?  ?  ?Upper Body Dressing/Undressing ?Upper body dressing   ?What is the patient wearing?: Pull over shirt ?   ?Upper body assist Assist Level: Moderate Assistance - Patient 50 - 74% ?   ?Lower Body Dressing/Undressing ?Lower body dressing ? ? ?   ?What is the patient wearing?: Pants ? ?  ? ?Lower body assist Assist for lower body dressing: Minimal Assistance - Patient > 75% ?   ? ?Toileting ?Toileting    ?Toileting assist Assist for toileting: Total Assistance - Patient < 25% ?  ?  ?Transfers ?Chair/bed transfer ? ?Transfers assist ?   ? ?Chair/bed transfer assist level: Contact Guard/Touching assist ?  ?   ?Locomotion ?Ambulation ? ? ?Ambulation assist ? ?   ? ?Assist level: Contact Guard/Touching assist ?Assistive device: [place]-rolling ?Max distance: 23 feet  ? ?Walk 10 feet activity ? ? ?Assist ? Walk 10 feet activity did not occur: Safety/medical concerns ? ?Assist level: Contact Guard/Touching assist ?Assistive device: [place]-rolling  ? ?Walk 50 feet activity ? ? ?Assist Walk 50 feet with 2 turns activity did not occur: Safety/medical concerns ? ?  ?   ? ? ?Walk 150 feet activity ? ? ?Assist Walk 150 feet activity did not occur: Safety/medical concerns ? ?  ?  ?  ? ?Walk 10 feet on uneven surface  ?activity ? ? ?Assist Walk 10 feet on uneven surfaces activity did not occur: Safety/medical concerns ? ? ?  ?   ? ?Wheelchair ? ? ? ? ?Assist Is the patient using a wheelchair?: Yes ?Type of Wheelchair: Manual ?  ? ?Wheelchair assist level: Minimal Assistance - Patient > 75% ?Max wheelchair distance: 51  ? ? ?Wheelchair 50 feet with 2 turns activity ? ? ? ?Assist ? ?  ?  ? ? ?Assist Level: Minimal Assistance - Patient > 75%  ? ?Wheelchair 150 feet activity  ? ? ? ?Assist ?   ? ? ?Assist Level: Moderate Assistance - Patient 50 - 74%  ? ?Blood pressure 104/60, pulse (!) 57, temperature 98.1 ?F (36.7 ?C), temperature source Oral, resp. rate 17, height 6\' 7"  (2.007 m), weight 82.1 kg, SpO2 100 %. ? ?Medical Problem List and Plan: ?1. Functional deficits secondary to heart failure/debility ?            -patient may shower ?            -ELOS/Goals: S 10-14 days ?           -Continue CIR therapies including PT, OT  ?2.  Antithrombotics: ?-DVT/anticoagulation:  Pharmaceutical: Other (comment)Eliquis 5 mg BID ?            -antiplatelet therapy: none ?3. Pain Management: Tylenol as needed ?            4/27- denies pain- con't regimen ?4. Mood: LCSW to evaluate and provide emotional support ?            -antipsychotic agents: n/a ?5. Neuropsych: This patient is capable of making decisions on his own behalf. ? -5/1 Pt reports  good mood overall ?6. Skin/Wound Care: Routine skin care checks ?            -- continue local wound care to RLE with Medihoney ?            -4/30 Continue MediHoney, site covered by foam pad. ? -5/6 wound  improving ?7. Fluids/Electrolytes/Nutrition: Strict Is and Os and follow-up chemistries + mag  in AM ?            --per HF team>>K+ greater than 4 and mag greater than 2 ?            4/27- K+ 4.6- and Mg 2.1- con't regimen ?            -4/30 qMonday labs to trend ?8: Nonischemic cardiomyopathy ?9: Acute on chronic biventricular heart failure (off Toprol Xl due to soft BP) ?            --daily weight ?            --continue torsemide 40 mg daily ?-- continue midodrine 15 mg 3 times daily ?-- continue amiodarone 400 mg twice daily ?--mexilitine 300 mg BID ?-- Mag-Ox 400 mg daily ?  ?Filed Weights  ? 06/13/21 0339 06/14/21 0500 06/16/21 0500  ?Weight: 81.7 kg 82.7 kg 82.1 kg  ? 5/7 needs updated weights ?10: Valvular heart disease: s/p TEER MVR, still with 3+ MR ?--tricuspid regurg>>moderate on TEE ?11: Acute pulmonary embolism: ?--continue Eliquis 5 mg twice daily ?12: Acute kidney injury atop chronic kidney disease stage IIIb; serum Cr is baseline 1.4-1.7 ?            --current Cr 2.1>>follow-up BMP ?--off spironolactone ?4/27- Cr stable today at 2.11 and BUN 54- is worse than baseline- however don't want to give IVFs right now due to CHF exacerbation just got over- will encourage him to drink and monitor ?            4/28 will order repeat labs for Monday ?           -5/3 Cr increased to 2.45 Bun 61, may need decrease torsemide, will  ? Contact heart failure team to discuss ?-5/4 Pt was seen by heart failure team, appreciate assistance, torsemide held this am, consider iv fluids if remains elevated ?-5/7 BUN/Cr stable, cardiorenal syndrome suspected by heart failure team, torsemide was restarted at 20mg  dose ? -continue to follow serially ?13: Dyslipidemia: ?-- continue Lipitor 10 mg daily ?14: Ascending aortic  aneurysm ?-- follow-up CT surgery as outpatient ?15: CAD: Left heart cath with mild nonobstructive CAD, continue statin ?16: NSVT/PVCs: Goal to keep potassium greater than 4 and magnesium greater than 2 ?--heart meds as abo

## 2021-06-19 NOTE — Progress Notes (Signed)
Physical Therapy Session Note ? ?Patient Details  ?Name: Matthew [name] ?MRN: [id number] ?Date of Birth: [birth year] ? ?Today's Date: 06/19/2021 ?PT Individual Time: 1415-1500 ?PT Individual Time Calculation (min): 45 min  ? ?Short Term Goals: ?Week 2:  PT Short Term Goal 1 (Week 2): STG=LTG based on ELOS ? ?Skilled Therapeutic Interventions/Progress Updates:  ?  Pt received seated in w/c in room. Pt's daughter [name] present for family education session. Reviewed pt's current cognitive impairments limiting his safety including poor ability to motor plan and overall poor safety awareness and decreased awareness of his deficits. Educated pt's daughter that therapy recommending 24/7 Supervision upon d/c home. Pt's daughter expresses concern about being able to provide 24/7 Supervision as she has children she has to keep an eye on. She does report that pt wears a "fall alert" button that will notify her if pt falls. Pt's daughter expresses interest in gaining more information regarding hiring caregivers and Medicare coverage for caregivers. Will notify CSW. Demonstrated how to safely assist pt with sit to stand transfers to RW, stand pivot transfers with RW, and short distance gait with RW during session at [facility] level up to 70 ft. Pt's daughter able to perform return demonstration of assisting pt. Also reviewed how to safely perform car transfer at simulation height of [place] that pt will d/c home in. Pt requires min A for car transfer due to body habitus and assist needed to get LLE in/out of car due to leg length. Manual w/c propulsion x 200 ft with use of BUE at Supervision level. Reviewed management of w/c parts and pt's daughter able to manage parts independently. Pt requests to return to bed at end of session. Sit to supine Supervision. Pt left seated in bed with needs in reach, bed alarm in place, family present. Pt's daughter with no further questions regarding PT at end of session. ? ?Therapy  Documentation ?Precautions:  ?Precautions ?Precautions: Fall ?Precaution Comments: L radial heart cath on 4/5, kyphotic ?Restrictions ?Weight Bearing Restrictions: No ? ? ? ? ? ?Therapy/Group: Individual Therapy ? ? ?Peter Congo, PT, DPT, CSR ? ?06/19/2021, 3:43 PM  ?

## 2021-06-19 NOTE — Progress Notes (Signed)
Occupational Therapy Session Note ? ?Patient Details  ?Name: Matthew [name] ?MRN: [id number] ?Date of Birth: [birth year] ? ?Today's Date: 06/19/2021 ?OT Individual Time: 8657-8469 ?OT Individual Time Calculation (min): 45 min  ? ? ?Short Term Goals: ?Week 2:  OT Short Term Goal 1 (Week 2): STGs = LTGs ? ?Skilled Therapeutic Interventions/Progress Updates:  ?  Pt resting in bed upon arrival. Pt has pure wick on. Pure wick removed before participating in therapy. Pt agreeable to getting OOB and getting dressed. Pt's daughter arrived for education while pt sitting EOB. Supine>sit EOB with supervision. Pt required mod A for LB dressing tasks and min A for UB dressing tasks. Pt donned shoes without assistance. Pt amb with RW to w/c and started to amb with RW to simulated shower stall when pt incontinent of bowel. Pt stated "it just came." Pt dependent for hygiene and max A for clothing mgmt. Pt's pants too large (pt lost weight in hospital) and required assistance to keep over waist when walking. Recommended BSC and shower seat. Note place for CSW to order [employer]. Showed picture of shower seat to daughter and she stated she would purchase one locally. Recommended supervision/min A for ADLs on discharge. Daughter verbalized understanding.  ? ?Therapy Documentation ?Precautions:  ?Precautions ?Precautions: Fall ?Precaution Comments: L radial heart cath on 4/5, kyphotic ?Restrictions ?Weight Bearing Restrictions: No ? ?Pain: ? Pt denies pain this afternoon ? ? ?Therapy/Group: Individual Therapy ? ?Rich Brave ?06/19/2021, 2:27 PM ?

## 2021-06-20 DIAGNOSIS — E871 Hypo-osmolality and hyponatremia: Secondary | ICD-10-CM

## 2021-06-20 LAB — BASIC METABOLIC PANEL
Anion gap: 10 (ref 5–15)
BUN: 64 mg/dL — ABNORMAL HIGH (ref 8–23)
CO2: 25 mmol/L (ref 22–32)
Calcium: 9 mg/dL (ref 8.9–10.3)
Chloride: 101 mmol/L (ref 98–111)
Creatinine, Ser: 2.43 mg/dL — ABNORMAL HIGH (ref 0.61–1.24)
GFR, Estimated: 26 mL/min — ABNORMAL LOW (ref >60)
Glucose, Bld: 89 mg/dL (ref 70–99)
Potassium: 4.3 mmol/L (ref 3.5–5.1)
Sodium: 136 mmol/L (ref 135–145)

## 2021-06-20 MED ORDER — TORSEMIDE 20 MG PO TABS: 20.0000 mg | ORAL_TABLET | ORAL | Status: DC

## 2021-06-20 MED ORDER — TORSEMIDE 20 MG PO TABS
20.0000 mg | ORAL_TABLET | ORAL | Status: DC
  Administered 2021-06-21: 20 mg via ORAL
  Filled 2021-06-20: qty 1

## 2021-06-20 NOTE — Progress Notes (Signed)
Physical Therapy Session Note ? ?Patient Details  ?Name: Matthew [name] ?MRN: [id number] ?Date of Birth: [birth year] ? ?Today's Date: 06/20/2021 ?PT Individual Time: 3524-8185 ?PT Individual Time Calculation (min): 25 min  ? ?Short Term Goals: ?Week 1:  PT Short Term Goal 1 (Week 1): Patient will complete bed mobility with MinA ?PT Short Term Goal 1 - Progress (Week 1): Met ?PT Short Term Goal 2 (Week 1): Patient will complete sit <> stand with LRAD and ModA consistently ?PT Short Term Goal 2 - Progress (Week 1): Met ?PT Short Term Goal 3 (Week 1): Patient will ambulate >60f with LRAD and ModA ?PT Short Term Goal 3 - Progress (Week 1): Met ? ?Skilled Therapeutic Interventions/Progress Updates:  ?  pt received in bed and agreeable to therapy. No complaint of pain. Pt found with urinal in place but states he was finished. Required repeated cues to pull brief back up over privates. Supine>sit with supervision and bed features. Pt requesting to wear shorts, but all shorts were soiled. Pt donned pants with supervision, assist only to get over gripper socks. Pt able to pull over hips in standing with  CGA. Pt then ambulated with RW and CGA only to keep pants up, 2 x 50 ft and 1 x 100 ft. Pt tolerated gait well, demoes hunched posture with flexed hip and knee posture throughout. Pt returned to room after session and remained in w/c, was left with all needs in reach and alarm active.  ? ?Therapy Documentation ?Precautions:  ?Precautions ?Precautions: Fall ?Precaution Comments: L radial heart cath on 4/5, kyphotic ?Restrictions ?Weight Bearing Restrictions: No ?General: ?  ? ? ? ?Therapy/Group: Individual Therapy ? ?[place]?06/20/2021, 9:27 AM  ?

## 2021-06-20 NOTE — Progress Notes (Signed)
PROGRESS NOTE   Subjective/Complaints:    Pt up with OT.  No new complaints or concerns. Review of Systems  Constitutional:  Negative for chills and fever.  Respiratory:  Negative for cough and shortness of breath.   Cardiovascular:  Negative for chest pain.  Gastrointestinal:  Positive for vomiting. Negative for nausea.  Genitourinary:  Negative for dysuria.       Objective:   No results found. No results for input(s): WBC, HGB, HCT, PLT in the last 72 hours.  Recent Labs    06/19/21 0553 06/20/21 0618  NA 139 136  K 4.9 4.3  CL 103 101  CO2 27 25  GLUCOSE 86 89  BUN 64* 64*  CREATININE 2.26* 2.43*  CALCIUM 9.4 9.0     Intake/Output Summary (Last 24 hours) at 06/20/2021 1325 Last data filed at 06/20/2021 1100 Gross per 24 hour  Intake 595 ml  Output 1825 ml  Net -1230 ml      Pressure Injury 06/03/21 Sacrum Left Stage 2 -  Partial thickness loss of dermis presenting as a shallow open injury with a red, pink wound bed without slough. (Active)  06/03/21 1800  Location: Sacrum  Location Orientation: Left  Staging: Stage 2 -  Partial thickness loss of dermis presenting as a shallow open injury with a red, pink wound bed without slough.  Wound Description (Comments):   Present on Admission:      Pressure Injury 06/08/21 Sacrum Right Stage 2 -  Partial thickness loss of dermis presenting as a shallow open injury with a red, pink wound bed without slough. (Active)  06/08/21 1755  Location: Sacrum  Location Orientation: Right  Staging: Stage 2 -  Partial thickness loss of dermis presenting as a shallow open injury with a red, pink wound bed without slough.  Wound Description (Comments):   Present on Admission: Yes    Physical Exam: Vital Signs Blood pressure 100/66, pulse 62, temperature 97.9 F (36.6 C), temperature source Oral, resp. rate 18, height 6\' 7"  (2.007 m), weight 82.1 kg, SpO2 100  %.  Constitutional: No distress . Vital signs reviewed. HEENT: NCAT, EOMI, oral membranes moist Neck: supple Cardiovascular: RRR Respiratory/Chest: CTA Bilaterally without wheezes or rales. Nonlabored breathing GI/Abdomen: BS +, non-tender, non-distended, soft Ext: no clubbing, cyanosis, or edema Psych: pleasant and cooperative  Neurological: alert and oriented, respond appropriately. Follows commands  Moving all 4 extremities. CN grossly intact but HOH, dysarthric Tone normal. Normal sensory Ext: no clubbing, cyanosis, or edema Psych: pleasant and cooperative , follows commands  Skin: Comments: Small skin ulcer on RLE covered in bordered foam dressing  Assessment/Plan: 1. Functional deficits which require 3+ hours per day of interdisciplinary therapy in a comprehensive inpatient rehab setting. Physiatrist is providing close team supervision and 24 hour management of active medical problems listed below. Physiatrist and rehab team continue to assess barriers to discharge/monitor patient progress toward functional and medical goals  Care Tool:  Bathing    Body parts bathed by patient: Chest, Left arm, Right arm, Abdomen, Right upper leg, Left upper leg, Face, Front perineal area, Left lower leg, Right lower leg   Body parts bathed by helper: Buttocks  Bathing assist Assist Level: Minimal Assistance - Patient > 75%     Upper Body Dressing/Undressing Upper body dressing   What is the patient wearing?: Pull over shirt    Upper body assist Assist Level: Minimal Assistance - Patient > 75%    Lower Body Dressing/Undressing Lower body dressing      What is the patient wearing?: Pants, Incontinence brief     Lower body assist Assist for lower body dressing: Moderate Assistance - Patient 50 - 74%     Toileting Toileting    Toileting assist Assist for toileting: Total Assistance - Patient < 25%     Transfers Chair/bed transfer  Transfers assist     Chair/bed  transfer assist level: Contact Guard/Touching assist     Locomotion Ambulation   Ambulation assist      Assist level: Contact Guard/Touching assist Assistive device: [place]-rolling Max distance: 70'   Walk 10 feet activity   Assist  Walk 10 feet activity did not occur: Safety/medical concerns  Assist level: Contact Guard/Touching assist Assistive device: [place]-rolling   Walk 50 feet activity   Assist Walk 50 feet with 2 turns activity did not occur: Safety/medical concerns  Assist level: Contact Guard/Touching assist Assistive device: [place]-rolling    Walk 150 feet activity   Assist Walk 150 feet activity did not occur: Safety/medical concerns         Walk 10 feet on uneven surface  activity   Assist Walk 10 feet on uneven surfaces activity did not occur: Safety/medical concerns         Wheelchair     Assist Is the patient using a wheelchair?: Yes Type of Wheelchair: Manual    Wheelchair assist level: Supervision/Verbal cueing Max wheelchair distance: 200'    Wheelchair 50 feet with 2 turns activity    Assist        Assist Level: Supervision/Verbal cueing   Wheelchair 150 feet activity     Assist      Assist Level: Supervision/Verbal cueing   Blood pressure 100/66, pulse 62, temperature 97.9 F (36.6 C), temperature source Oral, resp. rate 18, height 6\' 7"  (2.007 m), weight 82.1 kg, SpO2 100 %.  Medical Problem List and Plan: 1. Functional deficits secondary to heart failure/debility             -patient may shower             -ELOS/Goals: S 10-14 days            -Continue CIR therapies including PT, OT  2.  Antithrombotics: -DVT/anticoagulation:  Pharmaceutical: Other (comment)Eliquis 5 mg BID             -antiplatelet therapy: none 3. Pain Management: Tylenol as needed             4/27- denies pain- con't regimen 4. Mood: LCSW to evaluate and provide emotional support             -antipsychotic agents: n/a 5.  Neuropsych: This patient is capable of making decisions on his own behalf.  -5/1 Pt reports good mood overall 6. Skin/Wound Care: Routine skin care checks             -- continue local wound care to RLE with Medihoney             -4/30 Continue MediHoney, site covered by foam pad.  -5/6 wound  improving 7. Fluids/Electrolytes/Nutrition: Strict Is and Os and follow-up chemistries + mag in AM             --  per HF team>>K+ greater than 4 and mag greater than 2             4/27- K+ 4.6- and Mg 2.1- con't regimen             -4/30 qMonday labs to trend 8: Nonischemic cardiomyopathy 9: Acute on chronic biventricular heart failure (off Toprol Xl due to soft BP)             --daily weight -- continue midodrine 15 mg 3 times daily -- continue amiodarone 400 mg twice daily --mexilitine 300 mg BID -- Mag-Ox 400 mg daily -5/8 Torsemide held, 20mg  QOD followed by HF team   Filed Weights   06/13/21 0339 06/14/21 0500 06/16/21 0500  Weight: 81.7 kg 82.7 kg 82.1 kg   5/7 needs updated weights 10: Valvular heart disease: s/p TEER MVR, still with 3+ MR --tricuspid regurg>>moderate on TEE 11: Acute pulmonary embolism: --continue Eliquis 5 mg twice daily 12: Acute kidney injury atop chronic kidney disease stage IIIb; serum Cr is baseline 1.4-1.7             --current Cr 2.1>>follow-up BMP --off spironolactone 4/27- Cr stable today at 2.11 and BUN 54- is worse than baseline- however don't want to give IVFs right now due to CHF exacerbation just got over- will encourage him to drink and monitor             4/28 will order repeat labs for Monday            -5/3 Cr increased to 2.45 Bun 61, may need decrease torsemide, will   Contact heart failure team to discuss -5/4 Pt was seen by heart failure team, appreciate assistance, torsemide held this am, consider iv fluids if remains elevated -5/7 BUN/Cr stable, cardiorenal syndrome suspected by heart failure team, torsemide was restarted at 20mg  dose -5/8  torsemide 20 every other day, held today, continue to follow 13: Dyslipidemia: -- continue Lipitor 10 mg daily 14: Ascending aortic aneurysm -- follow-up CT surgery as outpatient 15: CAD: Left heart cath with mild nonobstructive CAD, continue statin 16: NSVT/PVCs: Goal to keep potassium greater than 4 and magnesium greater than 2 --heart meds as above #9 --not ablation candidate 17: Right lower extremity wound: Local wound care             -- Bilateral ABIs noncompressible              -Continue medihoney and bordered foam dressing 18: Hyponatremia: serum sodium 134; limit free water>>follow-up BMP 4/27- Na 135- con't to monitor 5/1- NA 135 continue to monitor 5/4 NA stable at 135, continue to follow 5/6 Na stable at 137, monitor 5/8 Na Stable 136, follow 19: Hyperkalemia currently resolved>>follow-up BMP             --keep K+ greater than 4             -4/27 K+ is 4.6, continue to monitor             -4/30 qMonday labs to trend  -5/1 K+ Wnl   -5/3 K+ 5.2, he had lokelma in past, may need additional dose, will contact HF team   -5/4 K + WNL  5/7 K + 4.9--stable, watch with increased diuretics  5/8 K + stable 4.3, continue to monitor 20. Using condom catheter- con't at night if need be.  21. Gastroesophageal Reflux             -PRN Maalox             -  Continue PPI  22.  UTI  -4/30 Pt's urine today noted to be very cloudy. Alert and oriented but some delay in answering questions.  Denies urinary symptoms such as burning, increased frequency, hesitation. -UA with urine culture placed due to change in color, possible change in mental status with delayed response to questions. -4/30 UA returned: + large leukocytes, mod Hgb, few bacteria.  Per Dr. Wynn Banker, wait for culture before deciding on treatment. -5/1 Urine Culture pending, pt denies dysuria -5/3  Urine culture pan sensitive Ecoli, Start Augmentin 500mg  BID for 7 days -5/7 Denies UTI symptoms    LOS: 12 days A FACE TO FACE  EVALUATION WAS PERFORMED  Fanny Dance 06/20/2021, 1:25 PM

## 2021-06-20 NOTE — Progress Notes (Signed)
Occupational Therapy Session Note ? ?Patient Details  ?Name: Matthew [name] ?MRN: [id number] ?Date of Birth: 07/23/1940 ? ?Today's Date: 06/20/2021 ?OT Individual Time: 6606-3016 ?OT Individual Time Calculation (min): 68 min  ? ? ?Short Term Goals: ?Week 2:  OT Short Term Goal 1 (Week 2): STGs = LTGs ? ?Skilled Therapeutic Interventions/Progress Updates:  ?Skilled OT intervention completed with focus on functional transfers, activity tolerance, motor planning/coordination and sequencing strategies. Pt received seated in w/c, no c/o pain, agreeable to session. Transported with total A in w/c <> gym for energy conservation. Sit > stand using RW with supervision then CGA ambulatory transfer <> EOM with demonstration of flexed trunk and bilateral knees during transfers and stance. While in stance, pt tolerated series of stands for about a min a piece while attempting to build pipe tower at table top level, however pt with diminishing posture and stance position with cues needed to remain upright but pt reporting increased fatigue and urgency to sit. Pt also reporting he has done a lot today, with task downgraded to seated level to improve BUE motor planning with task and concentration.  ? ?Seated at table top, pt completed pipe tower, with max cues and one step commands for task completion. Distractibility could have been a factor with a busy gym, but pt required repetitive cues, with minimal carryover between repeat pieces during task. Modified the task by providing visual drawings and having pt select from smaller pile of pieces, with improved accuracy with selecting pieces demonstrated. Pt still with difficulty orienting pieces with hand over hand assist utilized.  ? ?From pt's door to bed, pt ambulated using RW with CGA to EOB. Supervision for bed mobility with pt requesting pillow for bilateral knees 2/2 flexed contracture position with therapist encouraging semi-unsupported position for light passive stretching  however pt declined. Pt was left upright in bed, with bed alarm on, boost drink per request and all needs in reach at end session. ? ? ?Therapy Documentation ?Precautions:  ?Precautions ?Precautions: Fall ?Precaution Comments: L radial heart cath on 4/5, kyphotic ?Restrictions ?Weight Bearing Restrictions: No ? ? ? ? ?Therapy/Group: Individual Therapy ? ?Matthew [name] [name] ?06/20/2021, 7:42 AM ?

## 2021-06-20 NOTE — Progress Notes (Signed)
Patient ID: Matthew [name], male   DOB: Apr 26, 1940, 81 y.o.   MRN: [id number] Spoke with daughter via telephone to see how family education went over the weekend. She reports it went well she at times though may need to leave him more than one hour and she is thinking ahead of what she plans to do. She has life alert for him at home and aware of private duty agencies. She is more concerned about long term and explained what medicare and his supplement will cover but ling term only medicaid will cover long term care. She feels he is no eligible due to his assets. She thought Medicare helps with part time care in which it does not. Encouraged her to possibly consult a medicaid attorney to see what can be done. She is aware of his discharge being Wed. Home health is in place via [facility]. Will continue to follow until Wed. ?

## 2021-06-20 NOTE — Progress Notes (Signed)
Occupational Therapy Session Note ? ?Patient Details  ?Name: Matthew [name] ?MRN: [id number] ?Date of Birth: Jun 18, 1940 ? ?Today's Date: 06/20/2021 ?OT Individual Time: 7412-8786 ?OT Individual Time Calculation (min): 55 min  ? ? ?Short Term Goals: ?Week 1:  OT Short Term Goal 1 (Week 1): pt will consistently stand from wc with CGA to RW ?OT Short Term Goal 1 - Progress (Week 1): Met ?OT Short Term Goal 2 (Week 1): Pt will demonstrate improved perception and motor planning to don a shirt with set up. ?OT Short Term Goal 2 - Progress (Week 1): Progressing toward goal ?OT Short Term Goal 3 (Week 1): Pt will complete toilet transfer with min A. ?OT Short Term Goal 3 - Progress (Week 1): Met ?OT Short Term Goal 4 (Week 1): Pt will don pants with mod A. ?OT Short Term Goal 4 - Progress (Week 1): Met ?Week 2:  OT Short Term Goal 1 (Week 2): STGs = LTGs ? ? ?Skilled Therapeutic Interventions/Progress Updates:  ?  Pt received in w/c ready for therapy. Pt transported to ortho gym to do some assessments on the BITS for motor reactions.   ?Pt shown how to do the first visual tracking assessment and on his first trial his reaction time with 13 sec, 2nd trial 11, 3rd trial 8 sec, and 4th trial 4 seconds.  Explained to pt that to have adequate reaction time, processing time for driving in the future his reaction time needs to be 1 second or less.  Discussed having him focus on being more independent at home with his medications, finances, meal prep with his daughters help first.  Pt has expressed interest in driving again soon. Explained that he needs greater time to develop movement, speed, etc. before consulting his MD if he is ready to return to driving.   Pt seemed to understand.  Pt continued to work on various other activities on the [place].  His visual acuity is good but has great difficulty with efficient visual scanning.  ? ?Pt then transferred to mat with RW with CGA.  His sit to stands are consistently S with RW so focused  on sit to stand without use of arm for increased leg strength.  ?Holding a ball, pt worked on sit to partial stands (as he is not able to fully extend upright) 5 x4.  ? ?From seated, used 2 lb dowel bar for gently shoulder AROM and elbow AROM.  Discussed the need to keep his joints as flexible as possible due to his arthritis.  ? ?Pt transferred back to wc and taken back to his room.  Belt alarm on and all needs met.   ? ?Therapy Documentation ?Precautions:  ?Precautions ?Precautions: Fall ?Precaution Comments: L radial heart cath on 4/5, kyphotic ?Restrictions ?Weight Bearing Restrictions: No ?  ?Pain: ?Pain Assessment ?Pain Scale: 0-10 ?Pain Score: 0-No pain ? ? ? ?Therapy/Group: Individual Therapy ? ?[place] ?06/20/2021, 12:31 PM ?

## 2021-06-20 NOTE — Progress Notes (Signed)
Physical Therapy Session Note ? ?Patient Details  ?Name: Matthew [name] ?MRN: [id number] ?Date of Birth: 1940/11/10 ? ?Today's Date: 06/20/2021 ?PT Individual Time: 5465-6812 ?PT Individual Time Calculation (min): 42 min  ? ?Short Term Goals: ?Week 2:  PT Short Term Goal 1 (Week 2): STG=LTG based on ELOS ? ?Skilled Therapeutic Interventions/Progress Updates:  ?   ?Pt seen sitting in w/c using urinal - pt continent of bladder - charted in flowsheets. Transported in w/c to ortho rehab gym for time management.  ? ?Completed ambulatory car transfer with CGA and RW - car transfer completed at supervision level once he sat down - able to manage LE in/out without assistance but some mild difficulty due to leg length. Gait training during session with CGA and RW - ~46ft on level surfaces + ~24ft on ramp. Increased difficulty with descent > ascent for ramp negotiation with some soft knee buckling present on LLE. Seated UE ergometer bike forward direction + backward direction to target general strengthening and cardiovascular endurance.  ? ?Transported back to his room and remained seated in w/c with safety belt alarm on, call bell in reach, all personal items provided.  ? ?Therapy Documentation ?Precautions:  ?Precautions ?Precautions: Fall ?Precaution Comments: L radial heart cath on 4/5, kyphotic ?Restrictions ?Weight Bearing Restrictions: No ?General: ?  ? ?Therapy/Group: Individual Therapy ? ?Gabi Mcfate P Jessie Schrieber ?06/20/2021, 7:41 AM  ?

## 2021-06-20 NOTE — Progress Notes (Addendum)
? ? Advanced Heart Failure Rounding Note ? ?PCP-Cardiologist: Larae Grooms, MD  ? ?Subjective:   ? ?Discharged to CIR 4/26 ? ?Torsemide restarted 5/5 at reduced down 20 mg daily. SCr remains elevated above BL but has plateaued ~2.4. Last charted wt was 5/4 ? ?BP ok.  ? ?Decreased skin turgor on exam.  ? ?Hyperkalemia resolved, K 4.3 today  ? ? ?Objective:   ?Weight Range: ?82.1 kg ?Body mass index is 20.39 kg/m?.  ? ?Vital Signs:   ?Temp:  [97.6 ?F (36.4 ?C)-97.9 ?F (36.6 ?C)] 97.9 ?F (36.6 ?C) (05/08 0545) ?Pulse Rate:  [62-66] 62 (05/08 0545) ?Resp:  [18] 18 (05/08 0545) ?BP: (100-122)/(66-79) 100/66 (05/08 0545) ?SpO2:  [100 %] 100 % (05/08 0545) ?Last BM Date : 06/20/21 ? ?Weight change: ?Filed Weights  ? 06/13/21 0339 06/14/21 0500 06/16/21 0500  ?Weight: 81.7 kg 82.7 kg 82.1 kg  ? ? ?Intake/Output:  ? ?Intake/Output Summary (Last 24 hours) at 06/20/2021 0750 ?Last data filed at 06/20/2021 [id number] ?Gross per 24 hour  ?Intake 1069 ml  ?Output 1625 ml  ?Net -556 ml  ?  ? ? ?Physical Exam  ?  ?PHYSICAL EXAM: ?General:  elderly male, sitting up in bed. NAD ?HEENT: normal ?Neck: supple. JVD not elevated. Carotids 2+ bilat; no bruits.  ?Cor: PMI nondisplaced. Regular rate & rhythm. No rubs, gallops, 2/6 MR murmur ?Lungs: clear ?Abdomen: soft, nontender, nondistended. No hepatosplenomegaly.  ?Extremities: no cyanosis, clubbing, rash, edema ?Skin: Decreased skin turgor  ?Neuro: alert & orientedx3, cranial nerves grossly intact. moves all 4 extremities w/o difficulty. Affect pleasant ? ? ?Telemetry  ? ?N/A  ? ?Labs  ?  ?CBC ?No results for input(s): WBC, NEUTROABS, HGB, HCT, MCV, PLT in the last 72 hours. ? ?Basic Metabolic Panel ?Recent Labs  ?  06/19/21 ?[id number] 06/20/21 ?[id number]  ?NA 139 136  ?K 4.9 4.3  ?CL 103 101  ?CO2 27 25  ?GLUCOSE 86 89  ?BUN 64* 64*  ?CREATININE 2.26* 2.43*  ?CALCIUM 9.4 9.0  ? ?Liver Function Tests ?No results for input(s): AST, ALT, ALKPHOS, BILITOT, PROT, ALBUMIN in the last 72 hours. ? ?No  results for input(s): LIPASE, AMYLASE in the last 72 hours. ?Cardiac Enzymes ?No results for input(s): CKTOTAL, CKMB, CKMBINDEX, TROPONINI in the last 72 hours. ? ?BNP: ?BNP (last 3 results) ?Recent Labs  ?  05/09/21 ?[id number] 05/10/21 ?0205 05/13/21 ?2142  ?BNP >4,500.0* >4,500.0* >4,500.0*  ? ? ?ProBNP (last 3 results) ?No results for input(s): PROBNP in the last 8760 hours. ? ? ?D-Dimer ?No results for input(s): DDIMER in the last 72 hours. ?Hemoglobin A1C ?No results for input(s): HGBA1C in the last 72 hours. ?Fasting Lipid Panel ?No results for input(s): CHOL, HDL, LDLCALC, TRIG, CHOLHDL, LDLDIRECT in the last 72 hours. ?Thyroid Function Tests ?No results for input(s): TSH, T4TOTAL, T3FREE, THYROIDAB in the last 72 hours. ? ?Invalid input(s): FREET3 ? ?Other results: ? ? ?Imaging  ? ? ?No results found. ? ? ?Medications:   ? ? ?Scheduled Medications: ? amiodarone  400 mg Oral BID  ? amoxicillin-clavulanate  1 tablet Oral BID  ? apixaban  5 mg Oral BID  ? atorvastatin  10 mg Oral Daily  ? feeding supplement  1 Container Oral TID BM  ? leptospermum manuka honey  1 application. Topical Daily  ? magnesium oxide  400 mg Oral Daily  ? mexiletine  300 mg Oral Q12H  ? midodrine  15 mg Oral TID WC  ? pantoprazole  40 mg Oral Daily  ?  torsemide  20 mg Oral Daily  ? ? ?Infusions: ? ? ?PRN Medications: ?acetaminophen, alum & mag hydroxide-simeth, diphenhydrAMINE, guaiFENesin-dextromethorphan, methocarbamol, prochlorperazine **OR** prochlorperazine **OR** prochlorperazine, senna-docusate, sodium phosphate, sorbitol, traMADol, traZODone ? ? ? ?Patient Profile  ? ?81 y.o. male with history of chronic biventricular HF, NICM, CAD, severe MR/TR, HTN, obesity and multiple recent admissions,  admitted w/ acute PE, a/c CHF and frequent PVCs.  ?  ?Echo, EF 25%, mildly dilated RV with moderately decreased systolic function, severe MR and TR. IVC dilated ?  ?Underwent MitraClip 4/20.  ?Discharged to CIR 4/26  ? ?Assessment/Plan  ? ?1.  Chronic Biventricular HF   ?- Recent admit for a/c CHF requiring IV Lasix and milrinone  ?- ECHO 04/2020 EF 25-30% with mild MR.   ?- Echo 04/04/21 EF LVEF 20-25%, RV moderately reduced, LA/RA severely dilated, and severe MR.  ?- Echo per Dr. 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 Not candidate for PVC ablation. S/P MTEER but still with 3+ MR.  ?- stable off milrinone.  ?- Appears dry on exam. C/w AKI. Hold Torsemide today and reduce down to QOD dosing.  ?- GDMT limited d/t hypotension and AKI. ?Continue midodrine 15 mg TID with soft BP to help w/ renal perfusion for now. If blood pressure continues to improve may be able to decrease midodrine soon. ?- Off Toprol XL with soft BP.   ?- Off Spiro w/ elevated K and SCr ?  ?2. CAD ?- LHC w/ mild non-obstructive CAD ?- denies CP ?- continue statin ?- no ASA w/ Eliquis ?- off ? blocker w/ soft BP  ?  ?3. Mitral Regurgitation ?- Severe on echo 02/23 ?- TEE showed mild restriction of P2. Severe central/posterior MR due to P2 restriction and annular dilation ?- functional, severely dilated LA and LV  ?- S/P MTEER => still with 3+ MR.  ?  ?4. Tricuspid Regurgitation  ?- moderate on TEE  ?- functional, severely dilated RA and RV   ?- HF optimization per above  ?  ?5. Acute PE ?- On Eliquis.  ?- No evidence of DVT on [place] ?- ? 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  ?6. Hyperlipidemia ?- Continue statin ?  ?8. AKI on CKD IIIa ?- Previous Scr  baseline 1.4-1.7. Last few months ~ 2.1  ?- Suspect cardiorenal syndrome. ?- SCr trending back up, 2.1>>2.4>2.4. Stable  today at 2.4  ?- He appears dry on exam. Hold torsemide today an reduce to QOD dosing ?- Follow BMP  ?  ?9. NSVT/PVCs ?- recent high burden, suspect contributing to CM  ?- Slightly improved post MTEER ?- continue mexiletine 300 mg BID  ?- continue amiodarone 400 mg bid for now but will ultimately need dose reduction  ?- Keep K > 4 and Mag > 2 ?- per EP, he is not an ablation candidate ?  ? 10. Debility ?- c/w therapy ?- appreciate CIR's assistance  ? ?11. Hyperkalemia ?- corrected w/ lokelma, 5.2>>4.7>>4.9>4.3 today  ?- off spiro ?- follow BMP  ? ?Length of Stay: 12 ? ?Lyda Jester, PA-C  ?06/20/2021, 7:50  AM ? ?Advanced Heart Failure Team ?Pager [phone] (M-F; 7a - 5p)  ?Please contact [facility] for night-coverage after hours (5p -7a ) and weekends on [email or web address] ?  ?Patient seen and examined with the above-signed Advanced Practice Provider and/or Housestaff. I personally reviewed laboratory data, imaging studies and relevant notes. I independently examined the patient and formulated the important aspects of the plan. I have edited the note to reflect any of my changes or salient points. I have personally discussed the plan with the patient and/or family. ? ?Feels ok. Denies CP or SOB. Weight stable. BP ok on midodrine ? ?General:  Sitting in chair . No resp difficulty ?HEENT: normal ?Neck: supple. no JVD. Carotids 2+ bilat; no bruits. No lymphadenopathy or thryomegaly appreciated. ?Cor: PMI nondisplaced. Regular rate & rhythm. Soft Mr ?Lungs: clear ?Abdomen: soft, nontender, nondistended. No hepatosplenomegaly. No bruits or masses. Good bowel sounds. ?Extremities: no cyanosis, clubbing, rash, edema ?Neuro: alert & orientedx3, cranial nerves grossly intact. moves all 4 extremities w/o difficulty. Affect pleasant ? ?Doing well. Agree that he looks. Drop back torsemide. May only need 2-3x/week. ? ?Glori Bickers, MD ? ? ? ? ? ?

## 2021-06-21 DIAGNOSIS — S81801D Unspecified open wound, right lower leg, subsequent encounter: Secondary | ICD-10-CM

## 2021-06-21 LAB — BASIC METABOLIC PANEL
Anion gap: 8 (ref 5–15)
BUN: 58 mg/dL — ABNORMAL HIGH (ref 8–23)
CO2: 23 mmol/L (ref 22–32)
Calcium: 9.1 mg/dL (ref 8.9–10.3)
Chloride: 105 mmol/L (ref 98–111)
Creatinine, Ser: 2.02 mg/dL — ABNORMAL HIGH (ref 0.61–1.24)
GFR, Estimated: 33 mL/min — ABNORMAL LOW (ref >60)
Glucose, Bld: 90 mg/dL (ref 70–99)
Potassium: 4.5 mmol/L (ref 3.5–5.1)
Sodium: 136 mmol/L (ref 135–145)

## 2021-06-21 LAB — CBC
HCT: 35.2 % — ABNORMAL LOW (ref 39.0–52.0)
Hemoglobin: 11.2 g/dL — ABNORMAL LOW (ref 13.0–17.0)
MCH: 26.2 pg (ref 26.0–34.0)
MCHC: 31.8 g/dL (ref 30.0–36.0)
MCV: 82.4 fL (ref 80.0–100.0)
Platelets: 168 10*3/uL (ref 150–400)
RBC: 4.27 MIL/uL (ref 4.22–5.81)
RDW: 20.5 % — ABNORMAL HIGH (ref 11.5–15.5)
WBC: 5.6 10*3/uL (ref 4.0–10.5)
nRBC: 0 % (ref 0.0–0.2)

## 2021-06-21 LAB — MAGNESIUM: Magnesium: 2.1 mg/dL (ref 1.7–2.4)

## 2021-06-21 NOTE — Progress Notes (Signed)
Physical Therapy Session Note ? ?Patient Details  ?Name: Matthew [name] ?MRN: [id number] ?Date of Birth: [birth year] ? ?Today's Date: 06/21/2021 ?PT Individual Time: 3244-0102 ?PT Individual Time Calculation (min): 25 min  ? ?Short Term Goals: ?Week 2:  PT Short Term Goal 1 (Week 2): STG=LTG based on ELOS ? ?Skilled Therapeutic Interventions/Progress Updates:  ?  Pt received seated in bed asleep with urinal in place. Pt arousable and reports no need to void at this time. Bed mobility Supervision. Assisted pt with donning pants while seated EOB. Sit to stand and stand pivot transfer with RW and Supervision. Pt is setup A to wash face while seated in w/c at sink. Ambulation 2 x 100 ft with RW at Supervision level, ongoing severely kyphotic posture and narrow BOS. Pt left seated in w/c in room with needs in reach, quick release belt and chair alarm in place at end of session. ? ?Therapy Documentation ?Precautions:  ?Precautions ?Precautions: Fall ?Precaution Comments: L radial heart cath on 4/5, kyphotic ?Restrictions ?Weight Bearing Restrictions: No ? ? ? ? ? ? ?Therapy/Group: Individual Therapy ? ? ?Peter Congo, PT, DPT, CSRS ?06/21/2021, 8:30 AM  ?

## 2021-06-21 NOTE — Progress Notes (Signed)
Inpatient Rehabilitation Care Coordinator ?Discharge Note  ? ?Patient Details  ?Name: Matthew [name] ?MRN: [id number] ?Date of Birth: [birth year] ? ? ?Discharge location: HOME WITH DAUGHTER AND HER FAMILY AWARE WILL NEED 24/7 SUPERVISION ? ?Length of Stay: 14 DAYS ? ?Discharge activity level: SUPERVISION-CGA LEVEL ? ?Home/community participation: ACTIVE ? ?Patient response EY:CXKGYJ Literacy - How often do you need to have someone help you when you read instructions, pamphlets, or other written material from your doctor or pharmacy?: Sometimes ? ?Patient response EH:UDJSHF Isolation - How often do you feel lonely or isolated from those around you?: Rarely ? ?Services provided included: MD, RD, PT, OT, SLP, RN, CM, Pharmacy, SW ? ?Financial Services:  ?Field seismologist Utilized: [employer] Chief Executive Officer) ?  ? ?Choices offered to/list presented to: PT AND DAUGHTER ? ?Follow-up services arranged:  ?Home Health, Patient/Family request agency HH/DME, DME ?Home Health Agency: [facility]-PT,OT,SP,RN  ?  ?DME : [facility]-WHEELCHAIR AND 3 IN 1 ?HH/DME Requested Agency: [facility] ? ?Patient response to transportation need: ?Is the patient able to respond to transportation needs?: Yes ?In the past 12 months, has lack of transportation kept you from medical appointments or from getting medications?: No ?In the past 12 months, has lack of transportation kept you from meetings, work, or from getting things needed for daily living?: No ? ? ? ?Comments (or additional information):DAUGHTER WAS IN FOR EDUCATION AND IT WENT WELL. DAUGHTER AWARE OF HIS NEED FOR 24/7 SUPERVISION. HAS LIFE ALERT FOR HIM AT HOME ALSO. THINKING ABOUT LONG TERM PLANS-ENCOURAGED TO TALK WITH MEDICAID ATTORNEY ?.PRIVATE DUTY LIST GIVEN TO DAUGHTER IN CASE WANTS TO HIRE ASSIST ?Patient/Family verbalized understanding of follow-up arrangements:  Yes ? ?Individual responsible for coordination of the follow-up plan: JOANNA-DAUGHTER  [phone] ? ?Confirmed correct DME delivered: Lucy Chris 06/21/2021   ? ?Lucy Chris ?

## 2021-06-21 NOTE — Progress Notes (Shared)
Occupational Therapy Discharge Summary ? ?Patient Details  ?Name: Matthew [name] ?MRN: [id number] ?Date of Birth: 02-Nov-1940 ? ? ? ? ?Patient has met 7 of 11 long term goals due to improved activity tolerance and improved balance.  Patient to discharge at [facility] Assist level.  Patient's care partner is independent to provide the necessary physical and cognitive assistance at discharge.   ? ?Pt's daughter did 1 family education session and is aware of the care pt will need. ? ?Reasons goals not met: Pt did not meet min A goal of dynamic standing balance as he needs mod A without UE support when adjusting clothing or toileting. He did not meet dressing or toileting goals as he needs more A due to limited joint ROM, postural deficits, limited problem solving, motor planning and decreased processing skills.  ? ?Recommendation:  ?Patient will benefit from ongoing skilled OT services in home health setting to continue to advance functional skills in the area of BADL. ? ?Equipment: ?BSC   (his daughter will purchase a shower chair on her own) ? ?Reasons for discharge: treatment goals met ? ?Patient/family agrees with progress made and goals achieved: Yes ? ?OT Discharge ?Precautions/Restrictions  ?Precautions ?Precautions: Fall ?Restrictions ?Weight Bearing Restrictions: No ? ?ADL ?ADL ?Eating: Independent ?Grooming: Supervision/safety ?Upper Body Bathing: Supervision/safety ?Where Assessed-Upper Body Bathing: Shower ?Lower Body Bathing: Minimal assistance ?Where Assessed-Lower Body Bathing: Shower ?Upper Body Dressing: Minimal assistance ?Where Assessed-Upper Body Dressing: Wheelchair ?Lower Body Dressing: Moderate assistance ?Where Assessed-Lower Body Dressing: Wheelchair ?Toileting: Moderate assistance ?Where Assessed-Toileting: Toilet ?Toilet Transfer: Contact guard ?Toilet Transfer Method: Stand pivot ?Toilet Transfer Equipment: Raised toilet seat ?Walk-In Shower Transfer: Minimal assistance ?Walk-In Shower  Transfer Method: Stand pivot ?Walk-In Shower Equipment: Civil engineer, contracting with back ?Vision ?Baseline Vision/History: 1 Wears glasses ?Patient Visual Report: No change from baseline ?Eye Alignment: Within Functional Limits ?Tracking/Visual Pursuits: Decreased smoothness of vertical tracking;Unable to hold eye position out of midline;Decreased smoothness of horizontal tracking;Requires cues, head turns, or add eye shifts to track ?Visual Fields:  (difficult to fully assess, appears to have some bilateral visual field cuts based on performance in functional tasks) ?Perception  ?Perception: Impaired ?Inattention/Neglect: Other (comment) (when looking forward, he has difficulty locating objects in his periphery) ?Spatial Orientation: continually has difficulty with orienting shirt to don correctly, often puts on backwards or upside down ?Praxis ?Praxis: Impaired ?Praxis Impairment Details: Motor planning;Initiation ?Cognition ?Cognition ?Overall Cognitive Status: History of cognitive impairments - at baseline ?Arousal/Alertness: Awake/alert ?Orientation Level: Person;Place;Situation ?Person: Oriented ?Place: Oriented ?Situation: Oriented ?Memory: Impaired ?Memory Impairment: Storage deficit;Retrieval deficit;Decreased recall of new information ?Awareness: Impaired ?Awareness Impairment: Emergent impairment;Intellectual impairment ?Problem Solving: Impaired ?Problem Solving Impairment: Verbal basic;Functional basic ?Safety/Judgment: Impaired ?Brief Interview for Mental Status (BIMS) ?Repetition of Three Words (First Attempt): 3 ?Temporal Orientation: Year: Correct ?Temporal Orientation: Month: Accurate within 5 days ?Temporal Orientation: Day: Correct ?Recall: "Sock": Yes, no cue required ?Recall: "Blue": Yes, no cue required ?Recall: "Bed": No, could not recall ?BIMS Summary Score: 13 ?Sensation ?Sensation ?Light Touch: Appears Intact ?Hot/Cold: Appears Intact ?Proprioception: Appears Intact ?Stereognosis: Not  tested ?Coordination ?Gross Motor Movements are Fluid and Coordinated: No ?Fine Motor Movements are Fluid and Coordinated: No ?Finger Nose Finger Test: very slow on R, difficulty with accuracy on the L ?Heel Shin Test: Limited AROM B LE ?Motor  ?Motor ?Motor - Discharge Observations: generalized weakness, arthritis, poor motor planning ?Mobility  ?Bed Mobility ?Supine to Sit: Supervision/Verbal cueing ?Sit to Supine: Supervision/Verbal cueing ?Transfers ?Sit to Stand: Supervision/Verbal cueing ?Stand to  Sit: Supervision/Verbal cueing  ?Trunk/Postural Assessment  ?Cervical Assessment ?Cervical Assessment: Within Functional Limits ?Thoracic Assessment ?Thoracic Assessment: Within Functional Limits (kyphosis, severe in standing) ?Lumbar Assessment ?Lumbar Assessment: Within Functional Limits ?Postural Control ?Postural Control: Deficits on evaluation  ?Balance ?Balance ?Balance Assessed: Yes ?Standardized Balance Assessment ?Standardized Balance Assessment: Timed Up and Go Test ?Timed Up and Go Test ?TUG: Normal TUG (with CGA and RW) ?Normal TUG (seconds): 51 (AVG of 3 trials (62, 46, 45s)) ?Static Sitting Balance ?Static Sitting - Level of Assistance: 7: Independent ?Dynamic Sitting Balance ?Dynamic Sitting - Level of Assistance: 7: Independent ?Static Standing Balance ?Static Standing - Level of Assistance: 5: Stand by assistance (with support of RW) ?Dynamic Standing Balance ?Dynamic Standing - Level of Assistance: Other (comment) (CGA) ?Extremity/Trunk Assessment ?RUE Assessment ?Active Range of Motion (AROM) Comments: sh flexion to 140 (arthritis) ?LUE Assessment ?Active Range of Motion (AROM) Comments: sh flexion 45 due to arthritic shoulder ? ? ?Ernest ?06/21/2021, 1:35 PM ?

## 2021-06-21 NOTE — Progress Notes (Signed)
Physical Therapy Session Note ? ?Patient Details  ?Name: Matthew [name] ?MRN: [id number] ?Date of Birth: [birth year] ? ?Today's Date: 06/21/2021 ?PT Individual Time: 7846-9629 ?PT Individual Time Calculation (min): 70 min  ? ?Short Term Goals: ?Week 2:  PT Short Term Goal 1 (Week 2): STG=LTG based on ELOS ? ?Skilled Therapeutic Interventions/Progress Updates:  ?   ?Pt presenting sitting in w/c - family friend (pastor) at bedside who left session at the start. Pt agreeable to PT tx and denies pain. Pt with urinal in his pants to collect urine due to baseline incontinence - required convincing to remove prior to leaving his room. He denies opportunity to toilet when asked.  ? ?Pt transported to main rehab gym in w/c for time management.  ? ?Completed TUG x3 trials with RW and CGA ?1) 62 seconds ?2) 46 seconds ?3) 45 seconds ?AVG = 51 seconds ?*Scores >13.5 seconds indicate increased falls risk ? ?Instructed in stair training - navigated up/down 4, 6inch steps using 2 hand rails and minA required for both ascent and descent with increased difficulty descending. He requires instruction for step-to pattern for safety and assist required for steadying due to severity of crouched and kyphotic posture.  ? ?Gait training completed with CGA and RW in rehab hallways - ambulating 66ft - gait severely crouched with kyphosis, step to vs step-through gait pattern, speed <0.72m/s indicative of household ambulator and increased falls risk.  ? ?Completed seated cognitive tasks to challenge problem solving, sequencing, short term memory, and error recognition. Tasks included foam lego building (simple complexity) and card matching game with 2x3 square (6 cards total). Pt unable to complete lego building task despite max cues  ? ?Foam Lego mild complexity puzzle building to work on problem solving, sequencing, and error recognition. Unable to recognize or label "blue" lego.  ? ?Card matching with 2x3 square (6 cards total) - required max  cues for short term memory and max cues for instruction as pt unable to recall the simple directions.  ? ?Transported back to his room, remained seated in w/c with safety belt alarm on and call bell within reach.  ? ?Therapy Documentation ?Precautions:  ?Precautions ?Precautions: Fall ?Precaution Comments: L radial heart cath on 4/5, kyphotic ?Restrictions ?Weight Bearing Restrictions: No ?General: ?  ? ?Therapy/Group: Individual Therapy ? ?[name] ?06/21/2021, 7:33 AM  ?

## 2021-06-21 NOTE — Progress Notes (Signed)
Occupational Therapy Session Note ? ?Patient Details  ?Name: Matthew [name] ?MRN: [id number] ?Date of Birth: 1941-02-12 ? ?Today's Date: 06/21/2021 ?OT Individual Time: 1345-1430 ?OT Individual Time Calculation (min): 45 min  ? ? ?Short Term Goals: ?Week 2:  OT Short Term Goal 1 (Week 2): STGs = LTGs ? ?Skilled Therapeutic Interventions/Progress Updates:  ?Pt seen for final skilled OT session with plan for discharge home tomorrow with family as per team and MD. Pt seated bedside in w/c. Pt reports he had no appetite today. OT and SN encouraged his Boost and fruit cup at least and pt agreeable prior to sink side standing session. VSS. Pt was able to stand 8 sets of 2 minutes during assisted shaving tasks as well as when MD arrived for skin assessment of buttocks and peri region. Pt required supervision for sit to stand with RW support and min A for shaving tasks. OT replaced soiled preventative sacral protection pad following skin inspection while standing. Cues for upright posture due to significant flexed knees, hips and forward trunk. Seated intermittent rests required during session due to overall fatigue. Pt returned to bedside in w/c with chair alarm, call bell and needs within reach.  ?  ? ?Therapy Documentation ?Precautions:  ?Precautions ?Precautions: Fall ?Precaution Comments: L radial heart cath on 4/5, kyphotic ?Restrictions ?Weight Bearing Restrictions: No ?General: ?  ?Pain: ? No pain reported throughout session  ? ? ?Therapy/Group: Individual Therapy ? ?Vicenta Dunning ?06/21/2021, 3:54 PM ?

## 2021-06-21 NOTE — Progress Notes (Signed)
Occupational Therapy Session Note ? ?Patient Details  ?Name: Matthew [name] ?MRN: [id number] ?Date of Birth: [birth year] ? ?Today's Date: 06/21/2021 ?OT Individual Time: 7915-0569 ?OT Individual Time Calculation (min): 75 min  ? ? ?Short Term Goals: ?Week 2:  OT Short Term Goal 1 (Week 2): STGs = LTGs ? ?Skilled Therapeutic Interventions/Progress Updates:  ?  Pt seen for ADL training and functional mobility training with a focus on motor planning and problem solving. Pt received in w/c and asked pt if he would like to toilet.  Pt said no that he did not need to go.  Pt agreeable to a shower. While I was gathering his shower supplies, pt had taken off his socks.  Opted to just have him do a squat pivot to shower bench vs a stand pivot as his non slip socks were off.  ?In his previous room, pt had practiced squat pivots to his R to tub bench 2x with min A.  Today he was unable to motor plan how to use his hands for leverage or shift his hips for the transfer.  Tried 6x and unable to get pt to move his body to bench.   ?Backed up wc and redonned his nonslip socks so he could do a stand pivot.  Turned bench facing forward so he would have room to pivot.  ?Pt able to rise to stand with Supervision and then step to bench with CGA using RW.   ?Then had pt try to stand again with RW so I could lower pants and remove brief.  Pt was incontinent of bowel. ?Tried to have pt stand so I could cleanse him but due to his severe kyphosis and his body position in the shower I could not adequately reach his bottom.   ?Had pt wash his UB and legs and front perineal area. Tried to have pt stand again but he continued to have bowel incontinence.  ? ?Called his NT to bring in a stedy lift as I needed A with cleansing pt as bowel all over the bench.  NT arrived and pt able to stand up in stedy with Supervision.  NTs cleansed pt while I cleansed the bench and shower. ? ?Pt then positioned in w/c.  He needed mod A and max cues with motor  planning and problem solving how to put his shirt on today. He was having more difficulty and could have been due to shirt being very large with long sleeves. ? ?Pt needed mod A to start pants over feet and then stood up to RW to pull pants over hips with mod A.  ? ?Total with socks.  Pt stated he was not tired, but he was having more difficulty today following directions.  He is excited to go home tomorrow but will need 24/7 supervision and min -mod A with self care. Fortunately his mobility needs are light, he can stand with up with Supervision and transfer stand pivot with CGA.   ? ?Pt resting in w/c with belt alarm on and all needs met.  ? ?Therapy Documentation ?Precautions:  ?Precautions ?Precautions: Fall ?Precaution Comments: L radial heart cath on 4/5, kyphotic ?Restrictions ?Weight Bearing Restrictions: No ? ?  ?Vital Signs: ?Therapy Vitals ?Temp: 98.1 ?F (36.7 ?C) ?Temp Source: Oral ?Pulse Rate: (!) 57 ?Resp: 18 ?BP: 100/66 ?Patient Position (if appropriate): Lying ?Oxygen Therapy ?SpO2: 99 % ?O2 Device: Room Air ?Pain: ? No c/o pain  ?ADL: ?ADL ?Eating: Set up ?Grooming: Minimal cueing, Minimal assistance ?Upper Body  Bathing: Supervision/safety ?Where Assessed-Upper Body Bathing: Wheelchair ?Lower Body Bathing: Moderate assistance ?Where Assessed-Lower Body Bathing: Wheelchair ?Upper Body Dressing: Moderate assistance ?Where Assessed-Upper Body Dressing: Wheelchair ?Lower Body Dressing: Maximal assistance ?Where Assessed-Lower Body Dressing: Wheelchair ?Toileting: Maximal assistance ?Where Assessed-Toileting: Bedside Commode ?Toilet Transfer: Moderate assistance ?Toilet Transfer Method: Stand pivot ? ? ?Therapy/Group: Individual Therapy ? ?[place] ?06/21/2021, 8:29 AM ?

## 2021-06-21 NOTE — Progress Notes (Signed)
?                                                       PROGRESS NOTE ? ? ?Subjective/Complaints:    ?Pt in [place]. No new complaints this AM. No questions regarding planned discharge tomorrow.  ? ? ?Review of Systems  ?Constitutional:  Negative for chills, fever and malaise/fatigue.  ?Respiratory:  Negative for cough and shortness of breath.   ?Cardiovascular:  Negative for chest pain.  ?Gastrointestinal:  Negative for abdominal pain, heartburn and nausea.  ?Genitourinary:  Negative for dysuria.  ?Neurological:  Positive for weakness.  ? ? ?  ? ?Objective: ?  ?No results found. ?Recent Labs  ?  06/21/21 ?0543  ?WBC 5.6  ?HGB 11.2*  ?HCT 35.2*  ?PLT 168  ? ? ?Recent Labs  ?  06/20/21 ?0618 06/21/21 ?0543  ?NA 136 136  ?K 4.3 4.5  ?CL 101 105  ?CO2 25 23  ?GLUCOSE 89 90  ?BUN 64* 58*  ?CREATININE 2.43* 2.02*  ?CALCIUM 9.0 9.1  ? ? ? ?Intake/Output Summary (Last 24 hours) at 06/21/2021 0748 ?Last data filed at 06/21/2021 [id number] ?Gross per 24 hour  ?Intake 600 ml  ?Output 800 ml  ?Net -200 ml  ? ?  ? ?Pressure Injury 06/03/21 Sacrum Left Stage 2 -  Partial thickness loss of dermis presenting as a shallow open injury with a red, pink wound bed without slough. (Active)  ?06/03/21 1800  ?Location: Sacrum  ?Location Orientation: Left  ?Staging: Stage 2 -  Partial thickness loss of dermis presenting as a shallow open injury with a red, pink wound bed without slough.  ?Wound Description (Comments):   ?Present on Admission:   ?   ?Pressure Injury 06/08/21 Sacrum Right Stage 2 -  Partial thickness loss of dermis presenting as a shallow open injury with a red, pink wound bed without slough. (Active)  ?06/08/21 1755  ?Location: Sacrum  ?Location Orientation: Right  ?Staging: Stage 2 -  Partial thickness loss of dermis presenting as a shallow open injury with a red, pink wound bed without slough.  ?Wound Description (Comments):   ?Present on Admission: Yes  ? ? ?Physical Exam: ?Vital Signs ?Blood pressure 100/66, pulse (!) 57, temperature  98.1 ?F (36.7 ?C), temperature source Oral, resp. rate 18, height 6\' 7"  (2.007 m), weight 83.2 kg, SpO2 99 %. ? ?Constitutional: No distress . Vital signs reviewed. ?HEENT: NCAT, EOMI, oral membranes moist ?Neck: supple ?Cardiovascular: RRR ?Respiratory/Chest: CTA Bilaterally without wheezes or rales. Nonlabored breathing ?GI/Abdomen: BS +, non-tender, non-distended, soft ?Ext: no clubbing, cyanosis, or edema ?Psych: pleasant and cooperative  ?Neurological: alert and oriented, respond appropriately. Follows commands  Moving all 4 extremities. CN grossly intact but HOH, dysarthric ?Tone normal. Normal sensory ?Ext: no clubbing, cyanosis, or edema ?Psych: pleasant and cooperative , follows commands ? ?Skin: ?Comments: Small skin ulcer on RLE covered in bordered foam dressing, Sacral wound appears closed with no signs of infection, Very small LLE wound appears to be skin tear noted ? ?Assessment/Plan: ?1. Functional deficits which require 3+ hours per day of interdisciplinary therapy in a comprehensive inpatient rehab setting. ?Physiatrist is providing close team supervision and 24 hour management of active medical problems listed below. ?Physiatrist and rehab team continue to assess barriers to discharge/monitor patient progress toward functional and medical goals ? ?  Care Tool: ? ?Bathing ?   ?Body parts bathed by patient: Chest, Left arm, Right arm, Abdomen, Right upper leg, Left upper leg, Face, Front perineal area, Left lower leg, Right lower leg  ? Body parts bathed by helper: Buttocks ?  ?  ?Bathing assist Assist Level: Minimal Assistance - Patient > 75% ?  ?  ?Upper Body Dressing/Undressing ?Upper body dressing   ?What is the patient wearing?: Pull over shirt ?   ?Upper body assist Assist Level: Minimal Assistance - Patient > 75% ?   ?Lower Body Dressing/Undressing ?Lower body dressing ? ? ?   ?What is the patient wearing?: Pants, Incontinence brief ? ?  ? ?Lower body assist Assist for lower body dressing:  Moderate Assistance - Patient 50 - 74% ?   ? ?Toileting ?Toileting    ?Toileting assist Assist for toileting: Total Assistance - Patient < 25% ?  ?  ?Transfers ?Chair/bed transfer ? ?Transfers assist ?   ? ?Chair/bed transfer assist level: Contact Guard/Touching assist ?  ?  ?Locomotion ?Ambulation ? ? ?Ambulation assist ? ?   ? ?Assist level: Contact Guard/Touching assist ?Assistive device: [place]-rolling ?Max distance: 63'  ? ?Walk 10 feet activity ? ? ?Assist ? Walk 10 feet activity did not occur: Safety/medical concerns ? ?Assist level: Contact Guard/Touching assist ?Assistive device: [place]-rolling  ? ?Walk 50 feet activity ? ? ?Assist Walk 50 feet with 2 turns activity did not occur: Safety/medical concerns ? ?Assist level: Contact Guard/Touching assist ?Assistive device: [place]-rolling  ? ? ?Walk 150 feet activity ? ? ?Assist Walk 150 feet activity did not occur: Safety/medical concerns ? ?  ?  ?  ? ?Walk 10 feet on uneven surface  ?activity ? ? ?Assist Walk 10 feet on uneven surfaces activity did not occur: Safety/medical concerns ? ? ?  ?   ? ?Wheelchair ? ? ? ? ?Assist Is the patient using a wheelchair?: Yes ?Type of Wheelchair: Manual ?  ? ?Wheelchair assist level: Supervision/Verbal cueing ?Max wheelchair distance: 200'  ? ? ?Wheelchair 50 feet with 2 turns activity ? ? ? ?Assist ? ?  ?  ? ? ?Assist Level: Supervision/Verbal cueing  ? ?Wheelchair 150 feet activity  ? ? ? ?Assist ?   ? ? ?Assist Level: Supervision/Verbal cueing  ? ?Blood pressure 100/66, pulse (!) 57, temperature 98.1 ?F (36.7 ?C), temperature source Oral, resp. rate 18, height 6\' 7"  (2.007 m), weight 83.2 kg, SpO2 99 %. ? ?Medical Problem List and Plan: ?1. Functional deficits secondary to heart failure/debility ?            -patient may shower ?            -ELOS/Goals: S 10-14 days, Discharge planned for tomorrow 5/9 ?           -Continue CIR therapies including PT, OT  ?2.  Antithrombotics: ?-DVT/anticoagulation:  Pharmaceutical: Other  (comment)Eliquis 5 mg BID ?            -antiplatelet therapy: none ?3. Pain Management: Tylenol as needed ?            4/27- denies pain- con't regimen ?4. Mood: LCSW to evaluate and provide emotional support ?            -antipsychotic agents: n/a ?5. Neuropsych: This patient is capable of making decisions on his own behalf. ? -5/1 Pt reports good mood overall ?6. Skin/Wound Care: Routine skin care checks ?            --  continue local wound care to RLE with Medihoney ?            -4/30 Continue MediHoney, site covered by foam pad. ? -5/6 wound  improving ? -5/9 RLE wound appears improved- continue medihoney and foam pad, LLE skin tear- continue protective dressing, Sacral wound appears closed- continue protective dressing ?7. Fluids/Electrolytes/Nutrition: Strict Is and Os and follow-up chemistries + mag in AM ?            --per HF team>>K+ greater than 4 and mag greater than 2 ?            4/27- K+ 4.6- and Mg 2.1- con't regimen ?8: Nonischemic cardiomyopathy ?9: Acute on chronic biventricular heart failure (off Toprol Xl due to soft BP) ?            --daily weight ?-- continue midodrine 15 mg 3 times daily ?-- continue amiodarone 400 mg twice daily ?--mexilitine 300 mg BID ?-- Mag-Ox 400 mg daily ?-5/8 Torsemide held, 20mg  QOD followed by HF team ?  ?Filed Weights  ? 06/14/21 0500 06/16/21 0500 06/21/21 0536  ?Weight: 82.7 kg 82.1 kg 83.2 kg  ? 5/7 needs updated weights ?10: Valvular heart disease: s/p TEER MVR, still with 3+ MR ?--tricuspid regurg>>moderate on TEE ?11: Acute pulmonary embolism: ?--continue Eliquis 5 mg twice daily ?12: Acute kidney injury atop chronic kidney disease stage IIIb; serum Cr is baseline 1.4-1.7 ?            --current Cr 2.1>>follow-up BMP ?--off spironolactone ?4/27- Cr stable today at 2.11 and BUN 54- is worse than baseline- however don't want to give IVFs right now due to CHF exacerbation just got over- will encourage him to drink and monitor ?            4/28 will order repeat  labs for Monday ?           -5/3 Cr increased to 2.45 Bun 61, may need decrease torsemide, will  ? Contact heart failure team to discuss ?-5/4 Pt was seen by heart failure team, appreciate assistance, tors

## 2021-06-21 NOTE — Progress Notes (Signed)
Inpatient Rehabilitation Discharge Medication Review by a Pharmacist ? ?A complete drug regimen review was completed for this patient to identify any potential clinically significant medication issues. ? ?High Risk Drug Classes Is patient taking? Indication by Medication  ?Antipsychotic No   ?Anticoagulant Yes Eliquis for PE  ?Antibiotic Yes PO Augmentin for UTI to finish 5/10  ?Opioid No   ?Antiplatelet No   ?Hypoglycemics/insulin No   ?Vasoactive Medication Yes Amiodarone, mexiletine for PVC ?Midodrine for low BP ?Torsemide for fluid  ?Chemotherapy No   ?Other Yes Atorvastatin for HLD ?PPI for GERD  ? ? ? ?Type of Medication Issue Identified Description of Issue Recommendation(s)  ?Drug Interaction(s) (clinically significant) ?    ?Duplicate Therapy ?    ?Allergy ?    ?No Medication Administration End Date ?    ?Incorrect Dose ?    ?Additional Drug Therapy Needed ?    ?Significant med changes from prior encounter (inform family/care partners about these prior to discharge).    ?Other ?    ? ? ?Clinically significant medication issues were identified that warrant physician communication and completion of prescribed/recommended actions by midnight of the next day:  No ? ?Pharmacist comments: None ? ?Time spent performing this drug regimen review (minutes):  20 minutes ? ? ?Matthew [name] ?06/21/2021 9:35 AM ?

## 2021-06-22 LAB — BASIC METABOLIC PANEL
Anion gap: 7 (ref 5–15)
BUN: 58 mg/dL — ABNORMAL HIGH (ref 8–23)
CO2: 23 mmol/L (ref 22–32)
Calcium: 9 mg/dL (ref 8.9–10.3)
Chloride: 103 mmol/L (ref 98–111)
Creatinine, Ser: 1.92 mg/dL — ABNORMAL HIGH (ref 0.61–1.24)
GFR, Estimated: 35 mL/min — ABNORMAL LOW (ref >60)
Glucose, Bld: 92 mg/dL (ref 70–99)
Potassium: 4.4 mmol/L (ref 3.5–5.1)
Sodium: 133 mmol/L — ABNORMAL LOW (ref 135–145)

## 2021-06-22 MED ORDER — AMIODARONE HCL 400 MG PO TABS
400.0000 mg | ORAL_TABLET | Freq: Two times a day (BID) | ORAL | 0 refills | Status: DC
Start: 2021-06-22 — End: 2021-06-23

## 2021-06-22 MED ORDER — PANTOPRAZOLE SODIUM 40 MG PO TBEC: 40.0000 mg | DELAYED_RELEASE_TABLET | Freq: Every day | ORAL | 0 refills | Status: DC

## 2021-06-22 MED ORDER — MAGNESIUM OXIDE -MG SUPPLEMENT 400 (240 MG) MG PO TABS: 400.0000 mg | ORAL_TABLET | Freq: Every day | ORAL | 0 refills | Status: DC

## 2021-06-22 MED ORDER — ATORVASTATIN CALCIUM 10 MG PO TABS: 10.0000 mg | ORAL_TABLET | Freq: Every day | ORAL | 0 refills | Status: DC

## 2021-06-22 MED ORDER — MIDODRINE HCL 5 MG PO TABS: 15.0000 mg | ORAL_TABLET | Freq: Three times a day (TID) | ORAL | 0 refills | Status: DC

## 2021-06-22 MED ORDER — TORSEMIDE 20 MG PO TABS: 20.0000 mg | ORAL_TABLET | ORAL | 0 refills | Status: DC

## 2021-06-22 MED ORDER — MEDIHONEY WOUND/BURN DRESSING EX PSTE: 1.0000 "application " | PASTE | Freq: Every day | CUTANEOUS | Status: DC

## 2021-06-22 MED ORDER — APIXABAN 5 MG PO TABS: 5.0000 mg | ORAL_TABLET | Freq: Two times a day (BID) | ORAL | 0 refills | Status: DC

## 2021-06-22 MED ORDER — ACETAMINOPHEN 325 MG PO TABS: 325.0000 mg | ORAL_TABLET | ORAL | Status: DC | PRN

## 2021-06-22 MED ORDER — MEXILETINE HCL 150 MG PO CAPS: 300.0000 mg | ORAL_CAPSULE | Freq: Two times a day (BID) | ORAL | 0 refills | Status: DC

## 2021-06-22 NOTE — Progress Notes (Signed)
?                                                       PROGRESS NOTE ? ? ?Subjective/Complaints:    ?In bed this AM. No new complaints this AM. He is going home today. No additional questions. ? ? ?Review of Systems  ?Constitutional:  Negative for chills, fever and malaise/fatigue.  ?Respiratory:  Negative for cough and shortness of breath.   ?Cardiovascular:  Negative for chest pain.  ?Gastrointestinal:  Negative for abdominal pain, heartburn, nausea and vomiting.  ?Genitourinary:  Negative for dysuria and frequency.  ?Neurological:  Positive for weakness. Negative for headaches.  ?Psychiatric/Behavioral:  Negative for depression.   ? ? ?  ? ?Objective: ?  ?No results found. ?Recent Labs  ?  06/21/21 ?0543  ?WBC 5.6  ?HGB 11.2*  ?HCT 35.2*  ?PLT 168  ? ? ? ?Recent Labs  ?  06/21/21 ?0543 06/22/21 ?0518  ?NA 136 133*  ?K 4.5 4.4  ?CL 105 103  ?CO2 23 23  ?GLUCOSE 90 92  ?BUN 58* 58*  ?CREATININE 2.02* 1.92*  ?CALCIUM 9.1 9.0  ? ? ? ?Intake/Output Summary (Last 24 hours) at 06/22/2021 0800 ?Last data filed at 06/22/2021 [id number] ?Gross per 24 hour  ?Intake 360 ml  ?Output 350 ml  ?Net 10 ml  ? ?  ? ?Pressure Injury 06/03/21 Sacrum Left Stage 2 -  Partial thickness loss of dermis presenting as a shallow open injury with a red, pink wound bed without slough. (Active)  ?06/03/21 1800  ?Location: Sacrum  ?Location Orientation: Left  ?Staging: Stage 2 -  Partial thickness loss of dermis presenting as a shallow open injury with a red, pink wound bed without slough.  ?Wound Description (Comments):   ?Present on Admission:   ?   ?Pressure Injury 06/08/21 Sacrum Right Stage 2 -  Partial thickness loss of dermis presenting as a shallow open injury with a red, pink wound bed without slough. (Active)  ?06/08/21 1755  ?Location: Sacrum  ?Location Orientation: Right  ?Staging: Stage 2 -  Partial thickness loss of dermis presenting as a shallow open injury with a red, pink wound bed without slough.  ?Wound Description (Comments):    ?Present on Admission: Yes  ? ? ?Physical Exam: ?Vital Signs ?Blood pressure 103/66, pulse (!) 57, temperature 98 ?F (36.7 ?C), resp. rate 18, height 6\' 7"  (2.007 m), weight 83.5 kg, SpO2 100 %. ? ?Constitutional: No distress . Vital signs reviewed. Sitting in bed ?HEENT: NCAT, EOMI, oral membranes moist ?Neck: supple ?Cardiovascular: RRR ?Respiratory/Chest: CTA Bilaterally without wheezes or rales.  Nonlabored breathing ?GI/Abdomen: BS +, non-tender, non-distended, soft ?Ext: no clubbing, cyanosis, or edema ?Psych: pleasant and cooperative  ?Neurological: alert and oriented, respond appropriately. Follows commands  Moving all 4 extremities. CN grossly intact but HOH, dysarthric ?Tone normal.   ?Ext: no clubbing, cyanosis, or edema ?Psych: pleasant and cooperative , follows commands ? ?Skin: warm and dry ?Comments: Small skin ulcer on RLE covered in bordered foam dressing, Sacral wound appears closed with no signs of infection, Very small LLE wound appears to be skin tear noted ? ?Assessment/Plan: ?1. Functional deficits which require 3+ hours per day of interdisciplinary therapy in a comprehensive inpatient rehab setting. ?Physiatrist is providing close team supervision and 24 hour management of active medical problems  listed below. ?Physiatrist and rehab team continue to assess barriers to discharge/monitor patient progress toward functional and medical goals ? ?Care Tool: ? ?Bathing ?   ?Body parts bathed by patient: Chest, Left arm, Right arm, Abdomen, Right upper leg, Left upper leg, Face, Front perineal area, Left lower leg, Right lower leg  ? Body parts bathed by helper: Buttocks ?  ?  ?Bathing assist Assist Level: Minimal Assistance - Patient > 75% ?  ?  ?Upper Body Dressing/Undressing ?Upper body dressing   ?What is the patient wearing?: Pull over shirt ?   ?Upper body assist Assist Level: Minimal Assistance - Patient > 75% ?   ?Lower Body Dressing/Undressing ?Lower body dressing ? ? ?   ?What is the  patient wearing?: Pants, Incontinence brief ? ?  ? ?Lower body assist Assist for lower body dressing: Moderate Assistance - Patient 50 - 74% ?   ? ?Toileting ?Toileting    ?Toileting assist Assist for toileting: Moderate Assistance - Patient 50 - 74% ?  ?  ?Transfers ?Chair/bed transfer ? ?Transfers assist ?   ? ?Chair/bed transfer assist level: Supervision/Verbal cueing ?  ?  ?Locomotion ?Ambulation ? ? ?Ambulation assist ? ?   ? ?Assist level: Supervision/Verbal cueing ?Assistive device: [place]-rolling ?Max distance: 22'  ? ?Walk 10 feet activity ? ? ?Assist ? Walk 10 feet activity did not occur: Safety/medical concerns ? ?Assist level: Supervision/Verbal cueing ?Assistive device: [place]-rolling  ? ?Walk 50 feet activity ? ? ?Assist Walk 50 feet with 2 turns activity did not occur: Safety/medical concerns ? ?Assist level: Supervision/Verbal cueing ?Assistive device: [place]-rolling  ? ? ?Walk 150 feet activity ? ? ?Assist Walk 150 feet activity did not occur: Safety/medical concerns (fatigue) ? ?  ?  ?  ? ?Walk 10 feet on uneven surface  ?activity ? ? ?Assist Walk 10 feet on uneven surfaces activity did not occur: Safety/medical concerns ? ? ?  ?   ? ?Wheelchair ? ? ? ? ?Assist Is the patient using a wheelchair?: Yes ?Type of Wheelchair: Manual ?  ? ?Wheelchair assist level: Supervision/Verbal cueing ?Max wheelchair distance: 200'  ? ? ?Wheelchair 50 feet with 2 turns activity ? ? ? ?Assist ? ?  ?  ? ? ?Assist Level: Supervision/Verbal cueing  ? ?Wheelchair 150 feet activity  ? ? ? ?Assist ?   ? ? ?Assist Level: Supervision/Verbal cueing  ? ?Blood pressure 103/66, pulse (!) 57, temperature 98 ?F (36.7 ?C), resp. rate 18, height 6\' 7"  (2.007 m), weight 83.5 kg, SpO2 100 %. ? ?Medical Problem List and Plan: ?1. Functional deficits secondary to heart failure/debility ?            -patient may shower ?            -ELOS/Goals: S 10-14 days, Discharge planned for today ?           -Continue CIR therapies including PT,  OT  ? -Social work set up [employer] with SP,OT,PT,RN, discussed with patient ?2.  Antithrombotics: ?-DVT/anticoagulation:  Pharmaceutical: Other (comment)Eliquis 5 mg BID ?            -antiplatelet therapy: none ?3. Pain Management: Tylenol as needed ?            4/27- denies pain- con't regimen ?4. Mood: LCSW to evaluate and provide emotional support ?            -antipsychotic agents: n/a ?5. Neuropsych: This patient is capable of making decisions on his own behalf. ? -5/1  Pt reports good mood overall ?6. Skin/Wound Care: Routine skin care checks ?            -- continue local wound care to RLE with Medihoney ?            -4/30 Continue MediHoney, site covered by foam pad. ? -5/6 wound  improving ?-5/9 RLE wound appears improved- continue medihoney and foam pad, LLE skin tear- continue protective dressing, Sacral wound appears closed- continue protective dressing ?-5/10 Discussed good nutrition for wound healing ?7. Fluids/Electrolytes/Nutrition: Strict Is and Os and follow-up chemistries + mag in AM ?            --per HF team>>K+ greater than 4 and mag greater than 2 ?            4/27- K+ 4.6- and Mg 2.1- con't regimen ?8: Nonischemic cardiomyopathy ?9: Acute on chronic biventricular heart failure (off Toprol Xl due to soft BP) ?            --daily weight ?-- continue midodrine 15 mg 3 times daily ?-- continue amiodarone 400 mg twice daily ?--mexilitine 300 mg BID ?-- Mag-Ox 400 mg daily ?-5/8 Torsemide held, 20mg  QOD followed by HF team ?  ?Filed Weights  ? 06/16/21 0500 06/21/21 0536 06/22/21 0538  ?Weight: 82.1 kg 83.2 kg 83.5 kg  ? 5/10 Weight stable overall, will need continued outpatient f/u with cardiology ?10: Valvular heart disease: s/p TEER MVR, still with 3+ MR ?--tricuspid regurg>>moderate on TEE ?11: Acute pulmonary embolism: ?--continue Eliquis 5 mg twice daily ?12: Acute kidney injury atop chronic kidney disease stage IIIb; serum Cr is baseline 1.4-1.7 ?            --current Cr 2.1>>follow-up BMP ?--off  spironolactone ?4/27- Cr stable today at 2.11 and BUN 54- is worse than baseline- however don't want to give IVFs right now due to CHF exacerbation just got over- will encourage him to drink and monitor ?

## 2021-06-23 ENCOUNTER — Other Ambulatory Visit: Payer: Self-pay

## 2021-06-23 MED ORDER — PANTOPRAZOLE SODIUM 40 MG PO TBEC: 40.0000 mg | DELAYED_RELEASE_TABLET | Freq: Every day | ORAL | 0 refills | Status: DC

## 2021-06-23 MED ORDER — MIDODRINE HCL 5 MG PO TABS: 15.0000 mg | ORAL_TABLET | Freq: Three times a day (TID) | ORAL | 0 refills | Status: DC

## 2021-06-23 MED ORDER — AMIODARONE HCL 400 MG PO TABS: 400.0000 mg | ORAL_TABLET | Freq: Two times a day (BID) | ORAL | 0 refills | Status: DC

## 2021-06-23 MED ORDER — TORSEMIDE 20 MG PO TABS
20.0000 mg | ORAL_TABLET | ORAL | 0 refills | Status: DC
Start: 2021-06-23 — End: 2021-09-07

## 2021-06-23 NOTE — Telephone Encounter (Signed)
Pt's daughter calling requesting a 90 day supply on these medications that were prescribed in the hospital. Would Dr. Eldridge Dace like to refill these medications for a 90 day supply? Pt has an upcoming appt in June 2023 with Dr. Eldridge Dace. Please address ?

## 2021-06-23 NOTE — Telephone Encounter (Signed)
Pt's medications were sent to pt's pharmacy as requested. Confirmation received.  

## 2021-06-23 NOTE — Telephone Encounter (Signed)
OK to refill

## 2021-06-27 ENCOUNTER — Telehealth: Payer: Self-pay | Admitting: *Deleted

## 2021-06-27 NOTE — Telephone Encounter (Signed)
Received fax from [facility] stating patient has received both furosemide and torsemide based on records at dispensing pharmacy.  I placed call to patient to discuss.  Left message to call office.  ?

## 2021-07-01 ENCOUNTER — Other Ambulatory Visit: Payer: Self-pay | Admitting: Interventional Cardiology

## 2021-07-01 NOTE — Telephone Encounter (Signed)
I placed call to patient and left message to call office 

## 2021-07-04 ENCOUNTER — Ambulatory Visit (HOSPITAL_COMMUNITY)
Admit: 2021-07-04 | Discharge: 2021-07-04 | Disposition: A | Discharge status: Home / Self Care | Payer: Medicare Other | Attending: Cardiology | Admitting: Cardiology

## 2021-07-04 DIAGNOSIS — Z9889 Other specified postprocedural states: Secondary | ICD-10-CM | POA: Insufficient documentation

## 2021-07-04 DIAGNOSIS — I110 Hypertensive heart disease with heart failure: Secondary | ICD-10-CM | POA: Insufficient documentation

## 2021-07-04 DIAGNOSIS — I081 Rheumatic disorders of both mitral and tricuspid valves: Secondary | ICD-10-CM | POA: Insufficient documentation

## 2021-07-04 DIAGNOSIS — I340 Nonrheumatic mitral (valve) insufficiency: Secondary | ICD-10-CM | POA: Diagnosis present

## 2021-07-04 DIAGNOSIS — I5020 Unspecified systolic (congestive) heart failure: Secondary | ICD-10-CM | POA: Diagnosis not present

## 2021-07-04 DIAGNOSIS — Z954 Presence of other heart-valve replacement: Secondary | ICD-10-CM | POA: Diagnosis not present

## 2021-07-04 DIAGNOSIS — I2510 Atherosclerotic heart disease of native coronary artery without angina pectoris: Secondary | ICD-10-CM | POA: Diagnosis not present

## 2021-07-04 DIAGNOSIS — Z95818 Presence of other cardiac implants and grafts: Secondary | ICD-10-CM | POA: Diagnosis not present

## 2021-07-04 DIAGNOSIS — I712 Thoracic aortic aneurysm, without rupture, unspecified: Secondary | ICD-10-CM | POA: Insufficient documentation

## 2021-07-04 DIAGNOSIS — I429 Cardiomyopathy, unspecified: Secondary | ICD-10-CM | POA: Insufficient documentation

## 2021-07-04 DIAGNOSIS — E785 Hyperlipidemia, unspecified: Secondary | ICD-10-CM | POA: Diagnosis not present

## 2021-07-04 LAB — ECHOCARDIOGRAM COMPLETE
Area-P 1/2: 4.07 cm2
Calc EF: 36.8 %
MV M vel: 4.88 m/s
MV Peak grad: 95.1 mmHg
MV VTI: 1.6 cm2
P 1/2 time: 570 msec
Radius: 0.53 cm
S' Lateral: 5.8 cm
Single Plane A2C EF: 29.1 %
Single Plane A4C EF: 45.1 %

## 2021-07-04 NOTE — Telephone Encounter (Signed)
I spoke with patient's daughter who confirms patient is only taking torsemide.

## 2021-07-04 NOTE — Progress Notes (Signed)
  Echocardiogram 2D Echocardiogram has been performed.  Matthew [name] 07/04/2021, 4:11 PM

## 2021-07-07 ENCOUNTER — Telehealth (HOSPITAL_COMMUNITY): Payer: Self-pay

## 2021-07-07 NOTE — Telephone Encounter (Signed)
Called and spoke to patient's daughter [name] to confirm/remind patient of [facility].   Patient reminded to bring all medications and/or complete list.  Confirmed patient has transportation. Gave directions, instructed to utilize valet parking.  Confirmed appointment prior to ending call.

## 2021-07-08 ENCOUNTER — Telehealth (HOSPITAL_COMMUNITY): Payer: Self-pay | Admitting: Surgery

## 2021-07-08 ENCOUNTER — Ambulatory Visit (HOSPITAL_COMMUNITY)
Admission: RE | Admit: 2021-07-08 | Discharge: 2021-07-08 | Disposition: A | Discharge status: Home / Self Care | Payer: Medicare Other | Source: Ambulatory Visit | Attending: Internal Medicine | Admitting: Internal Medicine

## 2021-07-08 ENCOUNTER — Inpatient Hospital Stay: Payer: Medicare Other | Admitting: Physical Medicine & Rehabilitation

## 2021-07-08 ENCOUNTER — Encounter (HOSPITAL_COMMUNITY): Payer: Self-pay | Admitting: Internal Medicine

## 2021-07-08 VITALS — BP 102/60 | HR 52

## 2021-07-08 DIAGNOSIS — I472 Ventricular tachycardia, unspecified: Secondary | ICD-10-CM | POA: Insufficient documentation

## 2021-07-08 DIAGNOSIS — E43 Unspecified severe protein-calorie malnutrition: Secondary | ICD-10-CM | POA: Insufficient documentation

## 2021-07-08 DIAGNOSIS — I130 Hypertensive heart and chronic kidney disease with heart failure and stage 1 through stage 4 chronic kidney disease, or unspecified chronic kidney disease: Secondary | ICD-10-CM | POA: Insufficient documentation

## 2021-07-08 DIAGNOSIS — I081 Rheumatic disorders of both mitral and tricuspid valves: Secondary | ICD-10-CM | POA: Diagnosis not present

## 2021-07-08 DIAGNOSIS — I428 Other cardiomyopathies: Secondary | ICD-10-CM | POA: Diagnosis not present

## 2021-07-08 DIAGNOSIS — I493 Ventricular premature depolarization: Secondary | ICD-10-CM | POA: Diagnosis not present

## 2021-07-08 DIAGNOSIS — I2510 Atherosclerotic heart disease of native coronary artery without angina pectoris: Secondary | ICD-10-CM | POA: Insufficient documentation

## 2021-07-08 DIAGNOSIS — E669 Obesity, unspecified: Secondary | ICD-10-CM | POA: Insufficient documentation

## 2021-07-08 DIAGNOSIS — R531 Weakness: Secondary | ICD-10-CM | POA: Insufficient documentation

## 2021-07-08 DIAGNOSIS — R64 Cachexia: Secondary | ICD-10-CM | POA: Insufficient documentation

## 2021-07-08 DIAGNOSIS — I5082 Biventricular heart failure: Secondary | ICD-10-CM | POA: Insufficient documentation

## 2021-07-08 DIAGNOSIS — Z7901 Long term (current) use of anticoagulants: Secondary | ICD-10-CM | POA: Insufficient documentation

## 2021-07-08 DIAGNOSIS — N1832 Chronic kidney disease, stage 3b: Secondary | ICD-10-CM | POA: Diagnosis not present

## 2021-07-08 DIAGNOSIS — Z9889 Other specified postprocedural states: Secondary | ICD-10-CM | POA: Diagnosis not present

## 2021-07-08 DIAGNOSIS — I5022 Chronic systolic (congestive) heart failure: Secondary | ICD-10-CM

## 2021-07-08 DIAGNOSIS — I340 Nonrheumatic mitral (valve) insufficiency: Secondary | ICD-10-CM | POA: Diagnosis not present

## 2021-07-08 DIAGNOSIS — Z95818 Presence of other cardiac implants and grafts: Secondary | ICD-10-CM

## 2021-07-08 DIAGNOSIS — E875 Hyperkalemia: Secondary | ICD-10-CM | POA: Diagnosis not present

## 2021-07-08 DIAGNOSIS — Z79899 Other long term (current) drug therapy: Secondary | ICD-10-CM | POA: Insufficient documentation

## 2021-07-08 LAB — CBC
HCT: 39.1 % (ref 39.0–52.0)
Hemoglobin: 12 g/dL — ABNORMAL LOW (ref 13.0–17.0)
MCH: 26.3 pg (ref 26.0–34.0)
MCHC: 30.7 g/dL (ref 30.0–36.0)
MCV: 85.7 fL (ref 80.0–100.0)
Platelets: 176 10*3/uL (ref 150–400)
RBC: 4.56 MIL/uL (ref 4.22–5.81)
RDW: 20.6 % — ABNORMAL HIGH (ref 11.5–15.5)
WBC: 5.5 10*3/uL (ref 4.0–10.5)
nRBC: 0 % (ref 0.0–0.2)

## 2021-07-08 LAB — T4, FREE: Free T4: 1.31 ng/dL — ABNORMAL HIGH (ref 0.61–1.12)

## 2021-07-08 LAB — COMPREHENSIVE METABOLIC PANEL
ALT: 33 U/L (ref 0–44)
AST: 38 U/L (ref 15–41)
Albumin: 3.7 g/dL (ref 3.5–5.0)
Alkaline Phosphatase: 116 U/L (ref 38–126)
Anion gap: 11 (ref 5–15)
BUN: 52 mg/dL — ABNORMAL HIGH (ref 8–23)
CO2: 21 mmol/L — ABNORMAL LOW (ref 22–32)
Calcium: 9.3 mg/dL (ref 8.9–10.3)
Chloride: 98 mmol/L (ref 98–111)
Creatinine, Ser: 2.29 mg/dL — ABNORMAL HIGH (ref 0.61–1.24)
GFR, Estimated: 28 mL/min — ABNORMAL LOW (ref >60)
Glucose, Bld: 87 mg/dL (ref 70–99)
Potassium: 5.3 mmol/L — ABNORMAL HIGH (ref 3.5–5.1)
Sodium: 130 mmol/L — ABNORMAL LOW (ref 135–145)
Total Bilirubin: 0.9 mg/dL (ref 0.3–1.2)
Total Protein: 8.3 g/dL — ABNORMAL HIGH (ref 6.5–8.1)

## 2021-07-08 LAB — TSH: TSH: 2.099 u[IU]/mL (ref 0.350–4.500)

## 2021-07-08 LAB — PREALBUMIN: Prealbumin: 15.7 mg/dL — ABNORMAL LOW (ref 18–38)

## 2021-07-08 NOTE — Patient Instructions (Signed)
There has been no changes to your medications. ? ?Labs done today, your results will be available in MyChart, we will contact you for abnormal readings. ? ?Your physician recommends that you schedule a follow-up appointment in: 1 month ? ?If you have any questions or concerns before your next appointment please send us a message through mychart or call our office at [phone].   ? ?TO LEAVE A MESSAGE FOR THE NURSE SELECT OPTION 2, PLEASE LEAVE A MESSAGE INCLUDING: ?YOUR NAME ?DATE OF BIRTH ?CALL BACK NUMBER ?REASON FOR CALL**this is important as we prioritize the call backs ? ?YOU WILL RECEIVE A CALL BACK THE SAME DAY AS LONG AS YOU CALL BEFORE 4:00 PM ? ?At the [facility], you and your health needs are our priority. As part of our continuing mission to provide you with exceptional heart care, we have created designated Provider Care Teams. These Care Teams include your primary Cardiologist (physician) and Advanced Practice Providers (APPs- Physician Assistants and Nurse Practitioners) who all work together to provide you with the care you need, when you need it.  ? ?You may see any of the following providers on your designated Care Team at your next follow up: ?Dr Daniel Bensimhon ?Dr Dalton McLean ?Amy Clegg, NP ?Brittainy Simmons, PA ?Jessica Milford,NP ?Lindsay Finch, PA ?Lauren Kemp, PharmD ? ? ?Please be sure to bring in all your medications bottles to every appointment.  ? ? ?

## 2021-07-08 NOTE — Telephone Encounter (Signed)
-----   Message from Jolaine Artist, MD sent at 07/08/2021  3:48 PM EDT ----- K slightly high. Repeat BMET 2 weeks

## 2021-07-08 NOTE — Telephone Encounter (Signed)
I called patient and spoke with he and his daughter.  I reviewed results and scheduled repeat lab appt on 6/9.  Orders placed in [facility].

## 2021-07-08 NOTE — Progress Notes (Addendum)
AD[facility]ED [facility] CLINIC NOTE  PCP: Primary Cardiologist:  HPI:  Matthew [name] is a 81 year old male with h/o NICM/chronic systolic HF, CAD, HTN, obesity and MR s/p mTEER in 4/23.    Admitted 03/2021 with a/c CHF. Had not been taking his CHF medications or diuretics several days prior to admission. BNP on admission was >4,500.  Echo LVEF 20 to 25%, RV severely enlarged and severely reduced, severe mitral and tricuspid regurgitation, severe BAE, severely elevated RVSP, 50 mmHg.    Admitted 05/08/21 with AKI and metabolic encephalopathy.    Seen in [facility] clinic on 05/13/21. Volume appeared elevated.  PO lasix increased. R/LHC + TEE arranged for additional workup of severely reduced EF and mitral regurgitation.    Presented to the ED on 05/13/21 with complaints of dyspnea and CP. CTA chest with evidence of PE. BNP > 4,500. He was admitted for management of PE and acute on chronic biventricular heart failure.   Cath 4/23. Mild non-obstructive CAD EF < 20%. RHC ok.   Echo 05/14/21: EF 25%, LV severely dilated, heavy trabeculations with no evidence of thrombus, mildly dildated RV with moderately decreased systolic function, severe BAE, severe MR, severe TR, dilated IVC suggesting RAP > 15 mmHg  Reduced EF felt to be possibly due to frequent PVCs but EF not improving with attempts at PVC suppression. Unable to wean off milrinone. Underwent mTEER on 06/02/21. He was hypoxic post procedure and was intubated. Extubated on 04/21. Still with 3+ MR post TEER but improved clinically. He was weaned off milrinone on 04/24.  Discharged to CIR. Discharged home 06/22/21 weight 180 pounds  Here with his son. Says he is doing ok. Denies SOB, orthopnea or PND. Feels weak. Can only ambulate a few steps. No edema. No dizziness. Taking midodrine    ROS: All systems negative except as listed in HPI, PMH and Problem List.  SH:  Social History   Socioeconomic History   Marital status: Unknown    Spouse name:  Not on file   Number of children: Not on file   Years of education: Not on file   Highest education level: 11th grade  Occupational History   Occupation: retired  Tobacco Use   Smoking status: Never   Smokeless tobacco: Never  Vaping Use   Vaping Use: Never used  Substance and Sexual Activity   Alcohol use: No   Drug use: No   Sexual activity: Not on file  Other Topics Concern   Not on file  Social History Narrative   Not on file   Social Determinants of Health   Financial Resource Strain: Low Risk    Difficulty of Paying Living Expenses: Not very hard  Food Insecurity: No Food Insecurity   Worried About Programme researcher, broadcasting/film/video in the Last Year: Never true   Ran Out of Food in the Last Year: Never true  Transportation Needs: No Transportation Needs   Lack of Transportation (Medical): No   Lack of Transportation (Non-Medical): No  Physical Activity: Not on file  Stress: Not on file  Social Connections: Not on file  Intimate Partner Violence: Not on file    FH:  Family History  Problem Relation Age of Onset   Hypertension Father    Diabetes Brother    Heart attack Neg Hx    Stroke Neg Hx     Past Medical History:  Diagnosis Date   CAD (coronary artery disease)    Chronic HFrEF (heart failure with reduced  ejection fraction) (HCC)    Chronic kidney disease, stage 3b ([place])    Encephalopathy 04/2021   admitted for metabolic encephalopathy   HTN (hypertension)    Hyperlipidemia    NICM (nonischemic cardiomyopathy) ([place])    Obesity    Osteoarthritis    S/P mitral valve clip implantation 06/02/2021   MitraClip XTW x2 with Dr. Ali Lowe and Dr. Burt Knack   Severe mitral regurgitation    Thoracic aortic aneurysm (TAA) (HCC)    Thrombocytopenia ([place])    noted in labs in 2023   Tricuspid regurgitation     Current Outpatient Medications  Medication Sig Dispense Refill   acetaminophen (TYLENOL) 325 MG tablet Take 1-2 tablets (325-650 mg total) by mouth every 4 (four)  hours as needed for mild pain.     amiodarone (PACERONE) 400 MG tablet Take 1 tablet (400 mg total) by mouth 2 (two) times daily. 180 tablet 0   apixaban (ELIQUIS) 5 MG TABS tablet Take 1 tablet (5 mg total) by mouth 2 (two) times daily. 60 tablet 0   atorvastatin (LIPITOR) 10 MG tablet Take 1 tablet (10 mg total) by mouth daily. 30 tablet 0   leptospermum manuka honey (MEDIHONEY) PSTE paste Apply 1 application. topically daily.     magnesium oxide (MAG-OX) 400 (240 Mg) MG tablet Take 1 tablet (400 mg total) by mouth daily. 30 tablet 0   mexiletine (MEXITIL) 150 MG capsule Take 2 capsules (300 mg total) by mouth every 12 (twelve) hours. 60 capsule 0   midodrine (PROAMATINE) 5 MG tablet Take 3 tablets (15 mg total) by mouth 3 (three) times daily with meals. 810 tablet 0   pantoprazole (PROTONIX) 40 MG tablet Take 1 tablet (40 mg total) by mouth daily. 90 tablet 0   torsemide (DEMADEX) 20 MG tablet Take 1 tablet (20 mg total) by mouth every other day. 45 tablet 0   traMADol (ULTRAM) 50 MG tablet Take 50 mg by mouth every 8 (eight) hours as needed.     No current facility-administered medications for this encounter.    Vitals:   07/08/21 1152  BP: 102/60  Pulse: (!) 52  SpO2: 99%    PHYSICAL EXAM:  General:  Cachetic weak appearing. No resp difficulty HEENT: normal Neck: supple. no JVD. Carotids 2+ bilat; no bruits. No lymphadenopathy or thryomegaly appreciated. Cor: PMI nondisplaced. Regular rate & rhythm. 2/6 MR Lungs: clear Abdomen: soft, nontender, nondistended. No hepatosplenomegaly. No bruits or masses. Good bowel sounds. Extremities: no cyanosis, clubbing, rash, edema Neuro: alert & orientedx3, cranial nerves grossly intact. moves all 4 extremities w/o difficulty. Affect pleasant  ECG: Sinus brady 56 1AVB 296 RBBB Personally reviewed   ASSESSMENT & PLAN:   1. Chronic Biventricular HF .   - Echo 04/04/21 EF LVEF 20-25%, RV moderately reduced, LA/RA severely dilated, and  severe MR.  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 Cachexia, severe protein-calorie malnutrition -  he is taking Boost 2x/day. Continue - Continue home PT/OT - If not improving may need Palliative Cre   2. CAD - LHC w/ mild non-obstructive CAD - No s/s angina - continue statin - no ASA w/ Eliquis   3. Mitral Regurgitation - Severe on echo 02/23 - TEE showed mild restriction of P2. Severe central/posterior MR due to P2 restriction and annular dilation - functional, severely dilated LA and LV  - s/p mTEER => still with 3+ MR. But symptomatically much improved   4. Tricuspid Regurgitation  - moderate on TEE  - functional, severely dilated RA and RV   - HF optimization per above    5. Acute PE in 4/23 - On Eliquis. No bleeding   8. CKD IIIab- [place] baseline 2.1-2.4 - Recheck today   9. NSVT/PVCs - recent high burden, suspect contributing to CM  - improved post -TEER - continue mexiletine 300 mg BID  - reduce amio to 200 bid - Keep K > 4 and Mag > 2 - per EP, he is not an ablation candidate  10. Hyperkalemia - off spiro - check labs today  Total time spent 35 minutes. Over half that time spent discussing above.   Glori Bickers, MD  4:33 PM

## 2021-07-09 LAB — T3, FREE: T3, Free: 1.3 pg/mL — ABNORMAL LOW (ref 2.0–4.4)

## 2021-07-12 ENCOUNTER — Telehealth: Payer: Self-pay | Admitting: Interventional Cardiology

## 2021-07-12 NOTE — Telephone Encounter (Signed)
Left message to call back  

## 2021-07-12 NOTE — Telephone Encounter (Signed)
Spoke with patient's daughter [name] (OK per [facility]).  She states patient wanted her to call and see if there were any medication patient can take to help stimulate his appetite. Cheri Fowler that there are appetite stimulants that can be prescribed by patient's PCP, but our office does not prescribe these kinds of medications.  [name] states patient has new PCP appt next week and will discuss with them at that appt.  [name] expressed appreciation for call.

## 2021-07-12 NOTE — Telephone Encounter (Signed)
Pt's daughter is calling stating that pt's appetite is not very well at the moment and they are wanting to know if there is a medicine that can help with this. Please advise.

## 2021-07-14 ENCOUNTER — Emergency Department (HOSPITAL_COMMUNITY): Payer: Medicare Other

## 2021-07-14 ENCOUNTER — Encounter: Payer: Self-pay | Admitting: Physical Medicine & Rehabilitation

## 2021-07-14 ENCOUNTER — Other Ambulatory Visit: Payer: Self-pay

## 2021-07-14 ENCOUNTER — Encounter: Payer: Medicare Other | Attending: Physical Medicine & Rehabilitation | Admitting: Physical Medicine & Rehabilitation

## 2021-07-14 ENCOUNTER — Emergency Department (HOSPITAL_COMMUNITY)
Admission: EM | Admit: 2021-07-14 | Discharge: 2021-07-14 | Disposition: A | Discharge status: Home / Self Care | Payer: Medicare Other | Attending: Emergency Medicine | Admitting: Emergency Medicine

## 2021-07-14 ENCOUNTER — Encounter (HOSPITAL_COMMUNITY): Payer: Self-pay

## 2021-07-14 VITALS — BP 103/63 | HR 56

## 2021-07-14 DIAGNOSIS — R600 Localized edema: Secondary | ICD-10-CM | POA: Insufficient documentation

## 2021-07-14 DIAGNOSIS — I509 Heart failure, unspecified: Secondary | ICD-10-CM | POA: Insufficient documentation

## 2021-07-14 DIAGNOSIS — I2510 Atherosclerotic heart disease of native coronary artery without angina pectoris: Secondary | ICD-10-CM | POA: Insufficient documentation

## 2021-07-14 DIAGNOSIS — Z79899 Other long term (current) drug therapy: Secondary | ICD-10-CM | POA: Diagnosis not present

## 2021-07-14 DIAGNOSIS — X58XXXA Exposure to other specified factors, initial encounter: Secondary | ICD-10-CM | POA: Diagnosis not present

## 2021-07-14 DIAGNOSIS — R5381 Other malaise: Secondary | ICD-10-CM | POA: Diagnosis not present

## 2021-07-14 DIAGNOSIS — N1832 Chronic kidney disease, stage 3b: Secondary | ICD-10-CM

## 2021-07-14 DIAGNOSIS — F32A Depression, unspecified: Secondary | ICD-10-CM

## 2021-07-14 DIAGNOSIS — R7989 Other specified abnormal findings of blood chemistry: Secondary | ICD-10-CM | POA: Insufficient documentation

## 2021-07-14 DIAGNOSIS — I130 Hypertensive heart and chronic kidney disease with heart failure and stage 1 through stage 4 chronic kidney disease, or unspecified chronic kidney disease: Secondary | ICD-10-CM | POA: Insufficient documentation

## 2021-07-14 DIAGNOSIS — R011 Cardiac murmur, unspecified: Secondary | ICD-10-CM | POA: Insufficient documentation

## 2021-07-14 DIAGNOSIS — R001 Bradycardia, unspecified: Secondary | ICD-10-CM | POA: Diagnosis not present

## 2021-07-14 DIAGNOSIS — Z7901 Long term (current) use of anticoagulants: Secondary | ICD-10-CM | POA: Insufficient documentation

## 2021-07-14 DIAGNOSIS — R531 Weakness: Secondary | ICD-10-CM | POA: Insufficient documentation

## 2021-07-14 DIAGNOSIS — N189 Chronic kidney disease, unspecified: Secondary | ICD-10-CM | POA: Insufficient documentation

## 2021-07-14 DIAGNOSIS — S81801A Unspecified open wound, right lower leg, initial encounter: Secondary | ICD-10-CM | POA: Insufficient documentation

## 2021-07-14 DIAGNOSIS — S8991XA Unspecified injury of right lower leg, initial encounter: Secondary | ICD-10-CM | POA: Diagnosis present

## 2021-07-14 DIAGNOSIS — R42 Dizziness and giddiness: Secondary | ICD-10-CM | POA: Diagnosis not present

## 2021-07-14 LAB — COMPREHENSIVE METABOLIC PANEL
ALT: 33 U/L (ref 0–44)
AST: 38 U/L (ref 15–41)
Albumin: 3.3 g/dL — ABNORMAL LOW (ref 3.5–5.0)
Alkaline Phosphatase: 116 U/L (ref 38–126)
Anion gap: 8 (ref 5–15)
BUN: 46 mg/dL — ABNORMAL HIGH (ref 8–23)
CO2: 24 mmol/L (ref 22–32)
Calcium: 8.6 mg/dL — ABNORMAL LOW (ref 8.9–10.3)
Chloride: 101 mmol/L (ref 98–111)
Creatinine, Ser: 2.25 mg/dL — ABNORMAL HIGH (ref 0.61–1.24)
GFR, Estimated: 29 mL/min — ABNORMAL LOW (ref >60)
Glucose, Bld: 91 mg/dL (ref 70–99)
Potassium: 4 mmol/L (ref 3.5–5.1)
Sodium: 133 mmol/L — ABNORMAL LOW (ref 135–145)
Total Bilirubin: 0.8 mg/dL (ref 0.3–1.2)
Total Protein: 7.4 g/dL (ref 6.5–8.1)

## 2021-07-14 LAB — CBC WITH DIFFERENTIAL/PLATELET
Abs Immature Granulocytes: 0.02 10*3/uL (ref 0.00–0.07)
Basophils Absolute: 0 10*3/uL (ref 0.0–0.1)
Basophils Relative: 1 %
Eosinophils Absolute: 0.1 10*3/uL (ref 0.0–0.5)
Eosinophils Relative: 2 %
HCT: 36.5 % — ABNORMAL LOW (ref 39.0–52.0)
Hemoglobin: 11.3 g/dL — ABNORMAL LOW (ref 13.0–17.0)
Immature Granulocytes: 0 %
Lymphocytes Relative: 16 %
Lymphs Abs: 1 10*3/uL (ref 0.7–4.0)
MCH: 26.7 pg (ref 26.0–34.0)
MCHC: 31 g/dL (ref 30.0–36.0)
MCV: 86.3 fL (ref 80.0–100.0)
Monocytes Absolute: 0.8 10*3/uL (ref 0.1–1.0)
Monocytes Relative: 12 %
Neutro Abs: 4.4 10*3/uL (ref 1.7–7.7)
Neutrophils Relative %: 69 %
Platelets: 173 10*3/uL (ref 150–400)
RBC: 4.23 MIL/uL (ref 4.22–5.81)
RDW: 19.8 % — ABNORMAL HIGH (ref 11.5–15.5)
WBC: 6.3 10*3/uL (ref 4.0–10.5)
nRBC: 0 % (ref 0.0–0.2)

## 2021-07-14 LAB — URINALYSIS, ROUTINE W REFLEX MICROSCOPIC
Bilirubin Urine: NEGATIVE
Glucose, UA: NEGATIVE mg/dL
Hgb urine dipstick: NEGATIVE
Ketones, ur: NEGATIVE mg/dL
Nitrite: NEGATIVE
Protein, ur: NEGATIVE mg/dL
Specific Gravity, Urine: 1.013 (ref 1.005–1.030)
pH: 5 (ref 5.0–8.0)

## 2021-07-14 LAB — TROPONIN I (HIGH SENSITIVITY)
Troponin I (High Sensitivity): 14 ng/L (ref <18)
Troponin I (High Sensitivity): 14 ng/L (ref <18)

## 2021-07-14 LAB — BRAIN NATRIURETIC PEPTIDE: B Natriuretic Peptide: 851.5 pg/mL — ABNORMAL HIGH (ref 0.0–100.0)

## 2021-07-14 LAB — MAGNESIUM: Magnesium: 2.4 mg/dL (ref 1.7–2.4)

## 2021-07-14 IMAGING — DX DG CHEST 1V PORT
1 series · 2 of 2 positions shown · non-contrast
Comparison: [DATE]

CLINICAL DATA: Increased generalized weakness x1 month.

EXAM:
PORTABLE CHEST 1 VIEW

[Series 1: chest · 0.14mm/px · 2 of 2 slices shown]
[im 1/2]
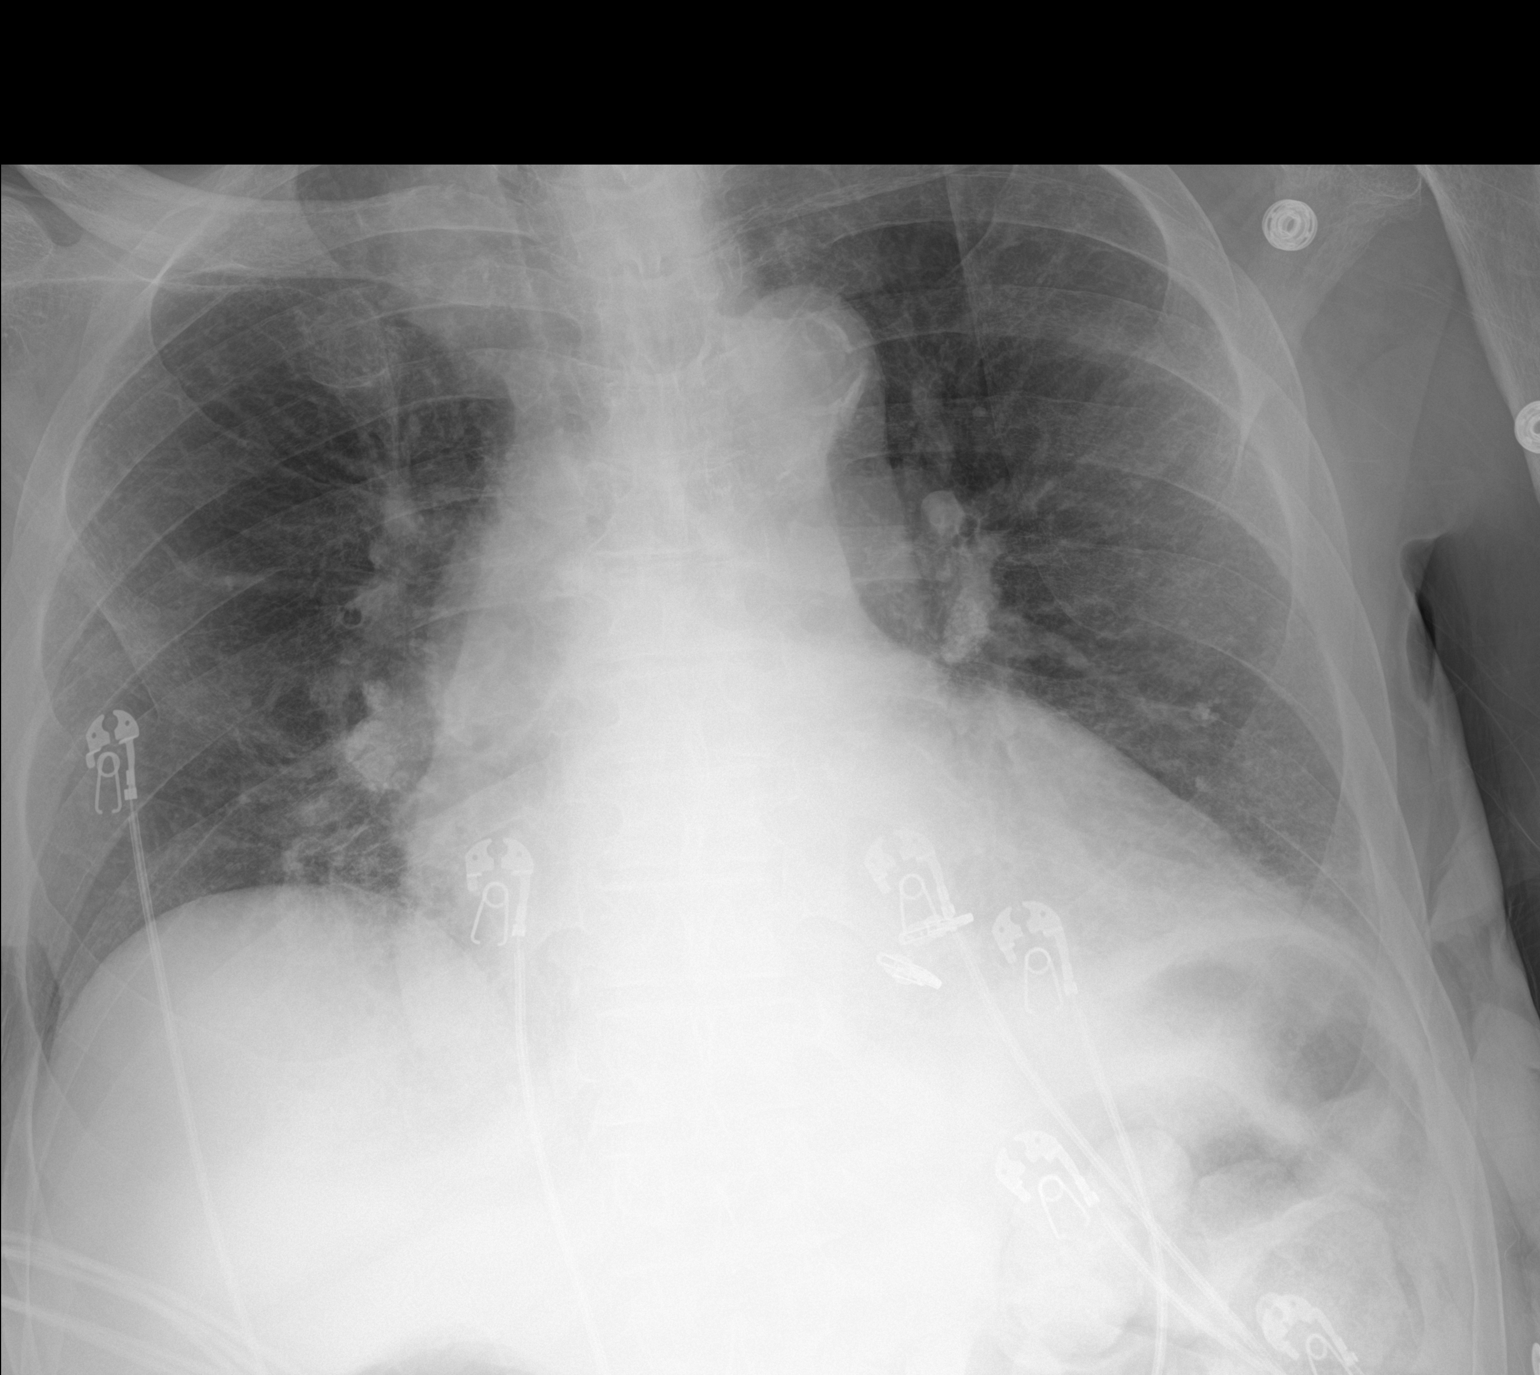
[im 2/2]
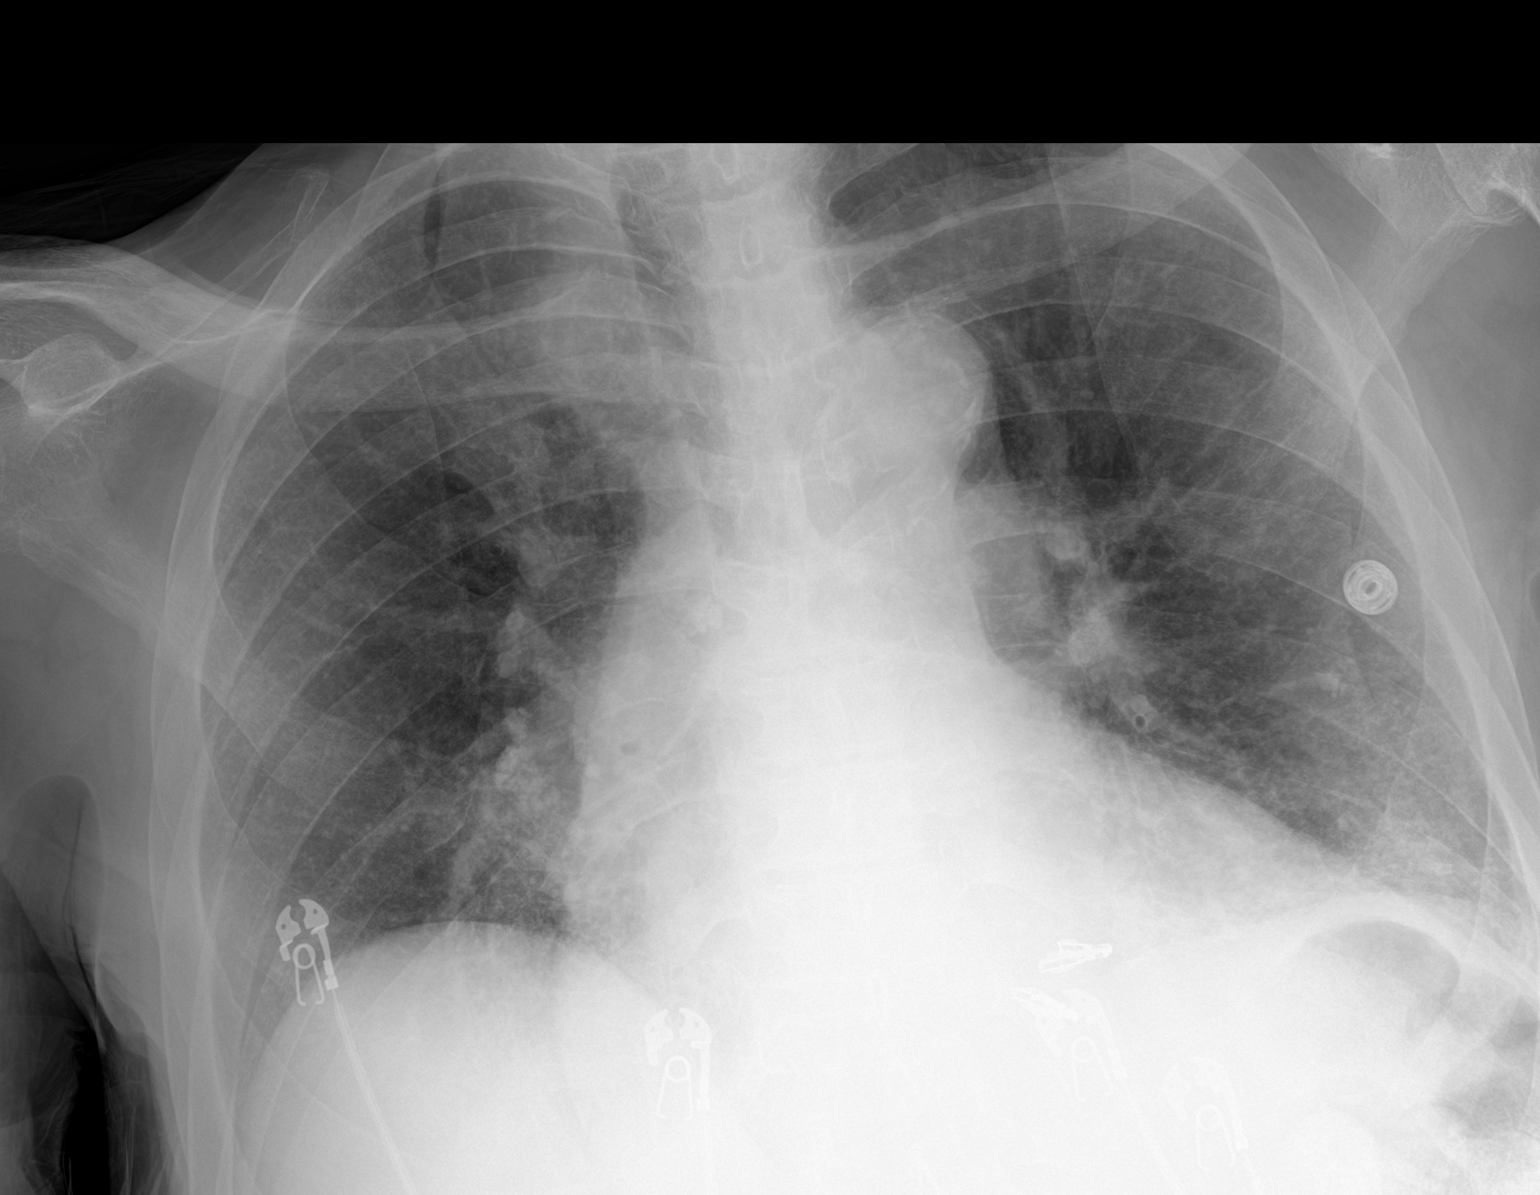

[2 of 2 positions shown; findings below may reference images not displayed]

FINDINGS: There is been interval removal of the previously noted endotracheal
tube, nasogastric tube, right internal low lung volumes are noted
with mild atelectasis and/or early infiltrate seen within the left
lung base. There is no evidence of a pleural effusion or
pneumothorax. The visualized skeletal structures are unremarkable.
IMPRESSION: Low lung volumes with mild left basilar atelectasis and/or early
infiltrate.

## 2021-07-14 IMAGING — MR MR HEAD W/O CM
12 of 13 series · 44 of 48 positions shown · non-contrast
Comparison: CT from earlier the same day.

CLINICAL DATA: Initial evaluation for acute dizziness.

EXAM:
MRI HEAD WITHOUT CONTRAST
TECHNIQUE: Multiplanar, multiecho pulse sequences of the brain and surrounding
structures were obtained without intravenous contrast.

[Series 5: DWI · axial · 3.0mm · 0.88mm/px · z∈[-78,+74]mm · 7 of 104 slices shown (1 of 4)]
[im 1/104]
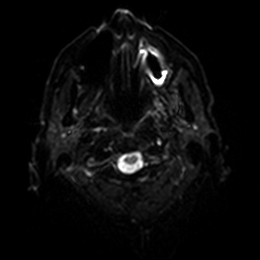
[im 18/104]
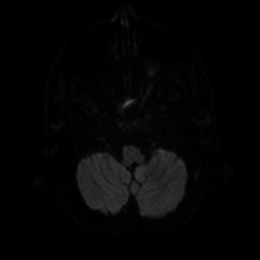
[im 35/104]
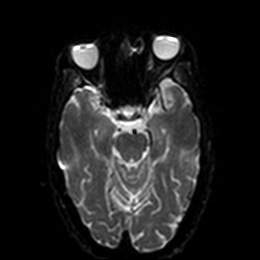
[im 52/104]
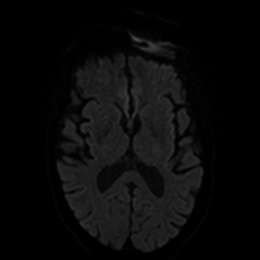
[im 69/104]
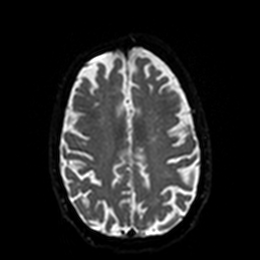
[im 86/104]
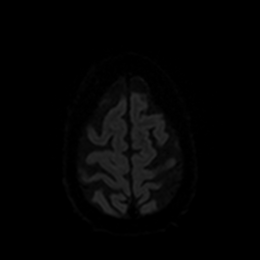
[im 104/104]
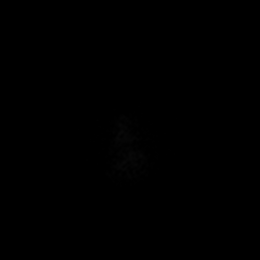

[Series 6: DWI · axial · 3.0mm · 0.88mm/px · z∈[-78,+74]mm · 4 of 52 slices shown (2 of 4)]
[im 1/52]
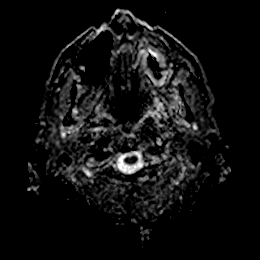
[im 18/52]
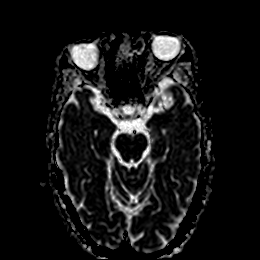
[im 35/52]
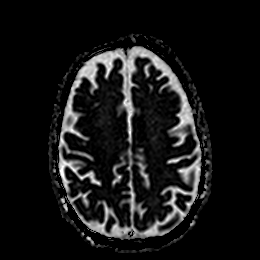
[im 52/52]
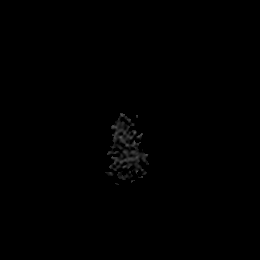

[Series 7: DWI · coronal · 4.0mm · 0.88mm/px · 6 of 76 slices shown (3 of 4)]
[im 1/76]
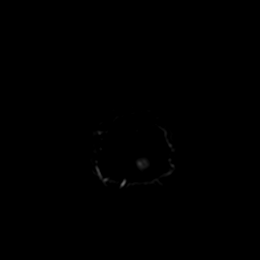
[im 16/76]
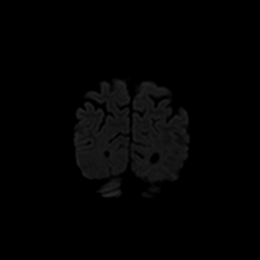
[im 31/76]
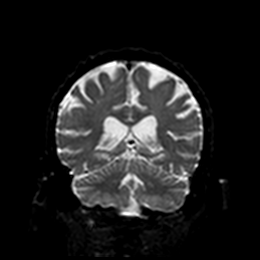
[im 46/76]
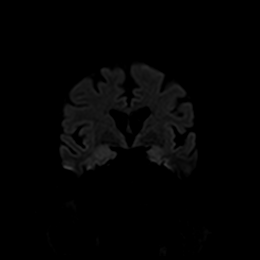
[im 61/76]
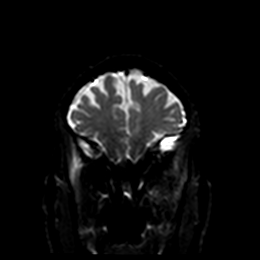
[im 76/76]
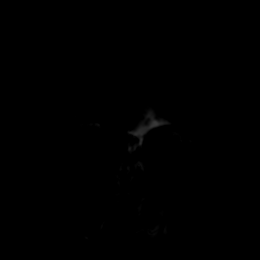

[Series 8: DWI · coronal · 4.0mm · 0.88mm/px · 3 of 38 slices shown (4 of 4)]
[im 1/38]
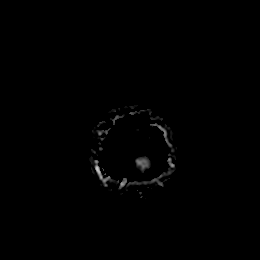
[im 19/38]
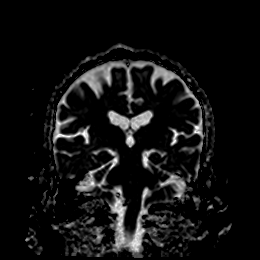
[im 38/38]
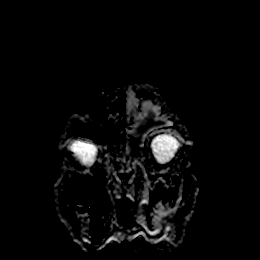

[Series 9: T1 · sagittal · 5.0mm · 0.75mm/px · 2 of 23 slices shown]
[im 1/23]
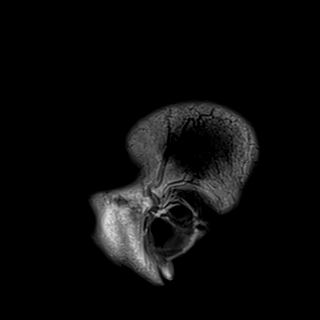
[im 23/23]
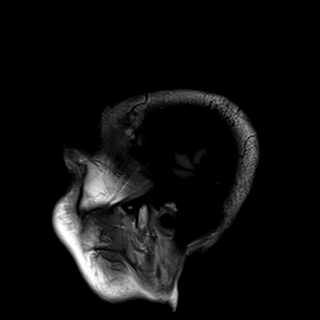

[Series 10: T2 · axial · 5.0mm · 0.72mm/px · z∈[-76,+72]mm · 2 of 26 slices shown (1 of 2)]
[im 1/26]
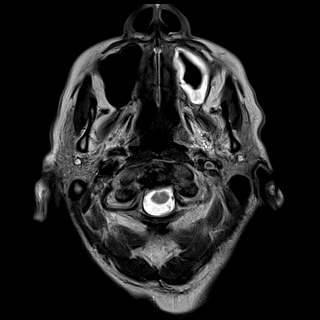
[im 26/26]
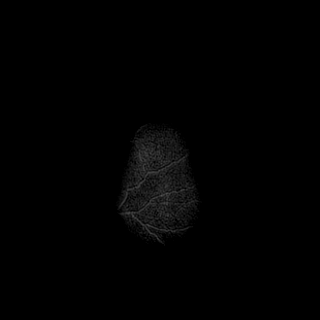

[Series 11: FLAIR · axial · 5.0mm · 0.45mm/px · z∈[-78,+71]mm · 2 of 26 slices shown]
[im 1/26]
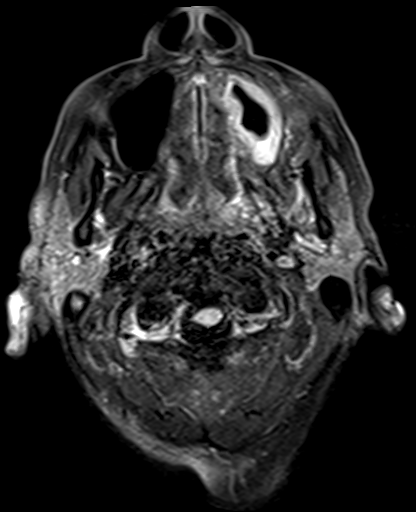
[im 26/26]
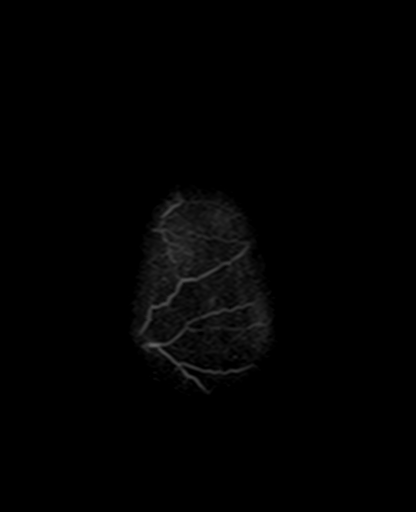

[Series 12: mag_images · axial · 3.0mm · 0.90mm/px · z∈[-85,+79]mm · 4 of 56 slices shown]
[im 1/56]
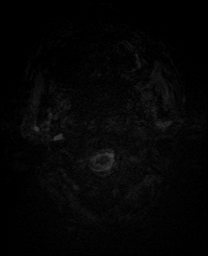
[im 19/56]
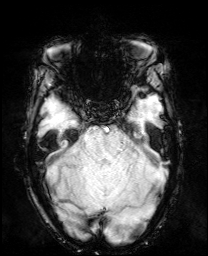
[im 37/56]
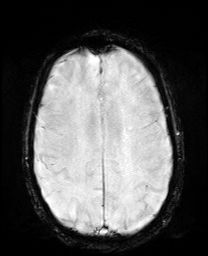
[im 56/56]
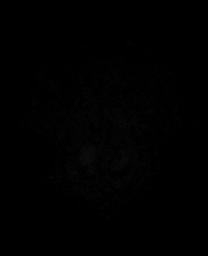

[Series 13: pha_images · axial · 3.0mm · 0.90mm/px · z∈[-85,+76]mm · 4 of 55 slices shown]
[im 1/55]
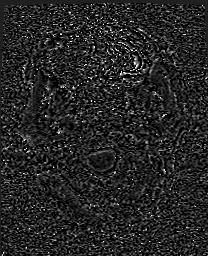
[im 19/55]
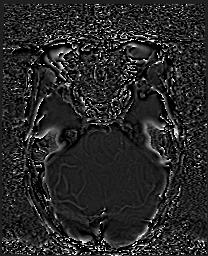
[im 37/55]
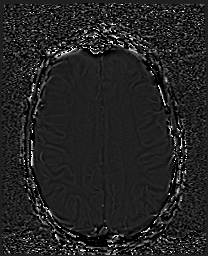
[im 55/55]
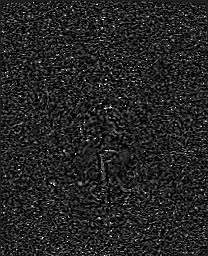

[Series 14: swi_images · axial · 3.0mm · 0.90mm/px · z∈[-85,+79]mm · 4 of 56 slices shown]
[im 1/56]
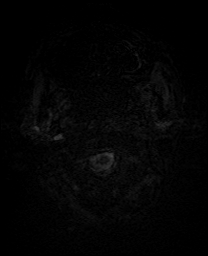
[im 19/56]
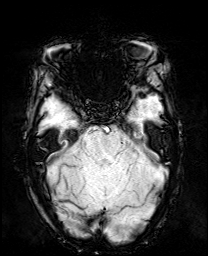
[im 37/56]
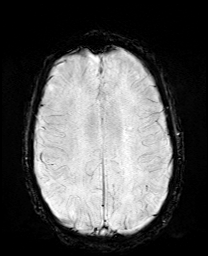
[im 56/56]
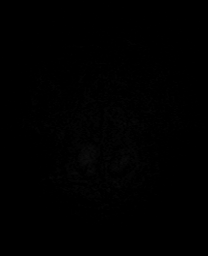

[Series 15: mip_images(sw) · axial · 24.0mm · 0.90mm/px · z∈[-75,+68]mm · 4 of 49 slices shown]
[im 1/49]
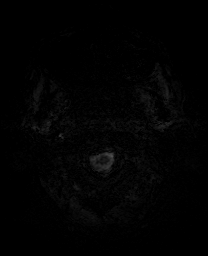
[im 17/49]
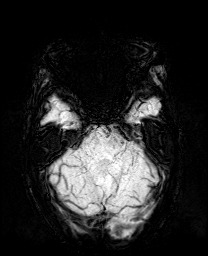
[im 33/49]
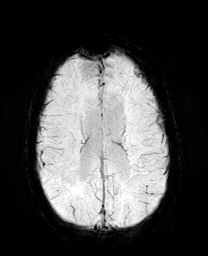
[im 49/49]
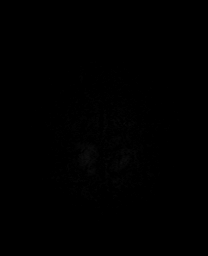

[Series 17: T2 · coronal · 5.0mm · 0.34mm/px · 2 of 31 slices shown (2 of 2)]
[im 1/31]
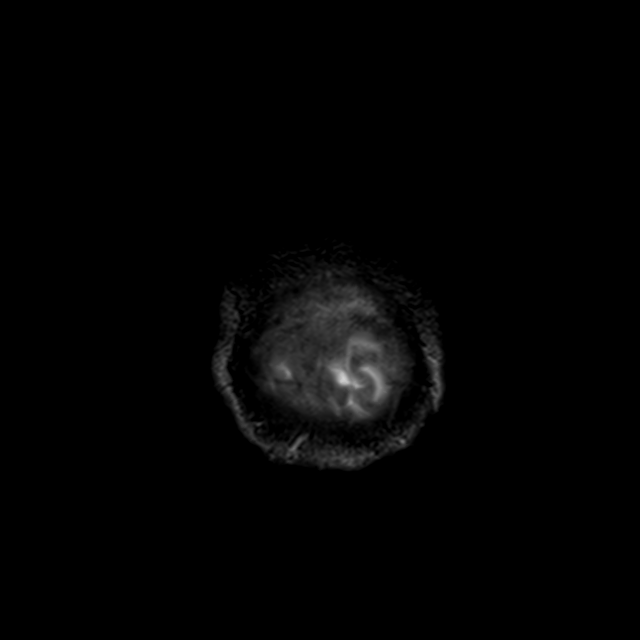
[im 31/31]
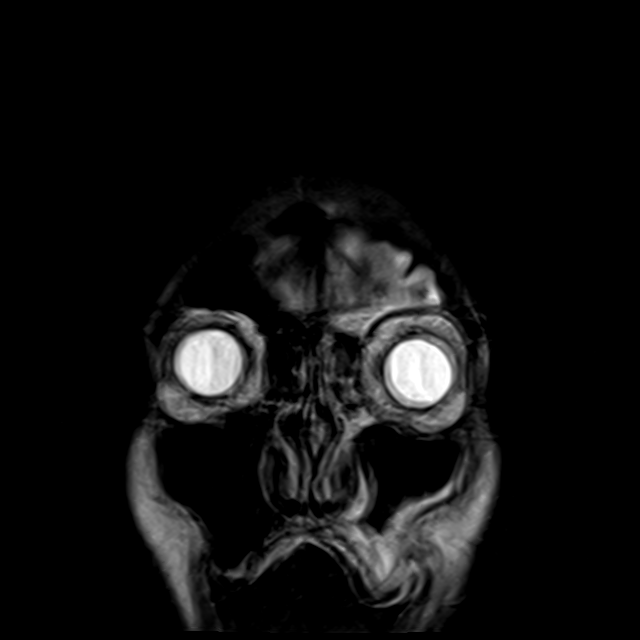

[44 of 48 positions shown; findings below may reference images not displayed]

FINDINGS: Brain: Cerebral volume within normal limits. Mild patchy T2/FLAIR
hyperintensity involving the periventricular deep white matter both
cerebral hemispheres, most consistent with chronic small vessel
ischemic disease. Changes are mild for age. Tiny remote left
cerebellar infarct noted.

No evidence for acute or subacute infarct. Gray-white matter
differentiation maintained. No areas of chronic cortical infarction.
No acute intracranial hemorrhage. Few punctate chronic micro
hemorrhages noted within the supratentorial cerebral white matter,
likely small vessel related.

No mass lesion, midline shift or mass effect. No hydrocephalus or
extra-axial fluid collection. Pituitary gland suprasellar region
normal.

Vascular: Major intracranial vascular flow voids are maintained.
Right V4 segment tortuous and partially invaginate upon the adjacent
medulla.

Skull and upper cervical spine: Craniocervical junction within
normal limits. Degenerative spondylosis at C3-4 with mild spinal
stenosis, partially visualized. Bone marrow signal intensity within
normal limits. No scalp soft tissue abnormality.

Sinuses/Orbits: Globes orbital soft tissues demonstrate no acute
finding. Moderate mucosal thickening present within the left
paranasal sinuses with superimposed air-fluid level within the left
maxillary sinus. No significant mastoid effusion. Inner ear
structures grossly normal.

Other: None.
IMPRESSION: 1. No acute intracranial abnormality.
2. Mild chronic microvascular ischemic disease for age, with tiny
remote left cerebellar infarct.
3. Acute left paranasal sinusitis.

## 2021-07-14 IMAGING — CT CT HEAD W/O CM
4 series · 16 of 47 positions shown, 18 images · non-contrast
Comparison: [DATE].

CLINICAL DATA: Dizziness.



[Series 3: head without · axial · non-contrast · 0.47mm/px · z∈[-57,+63]mm · 7 of 33 slices shown, 9 images]
[im 5/33  brain]
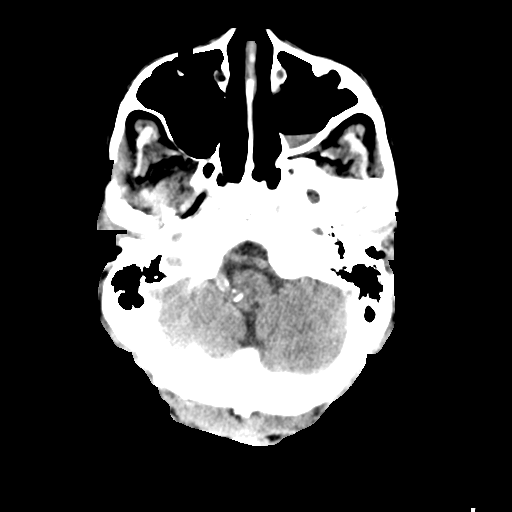
[im 5/33  bone]
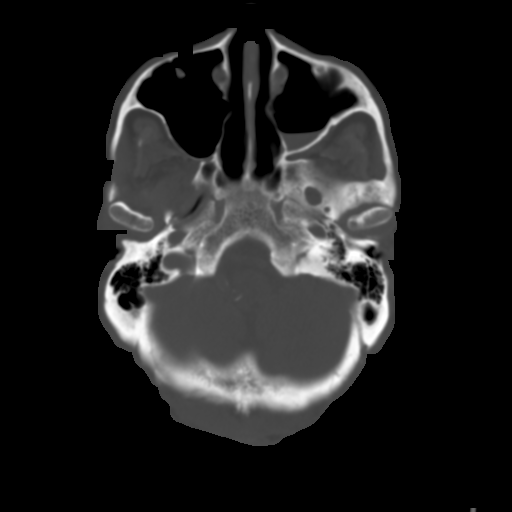
[im 9/33  brain]
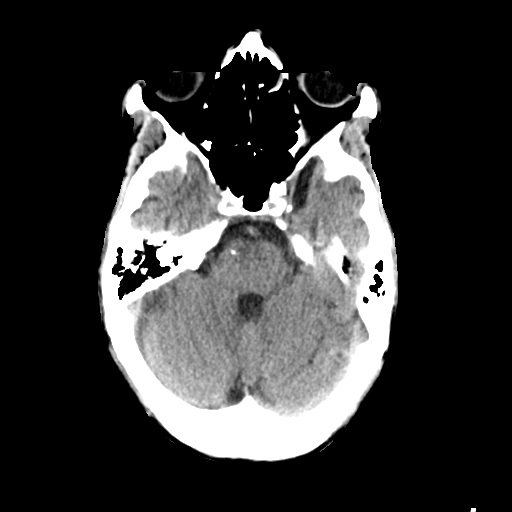
[im 13/33  brain]
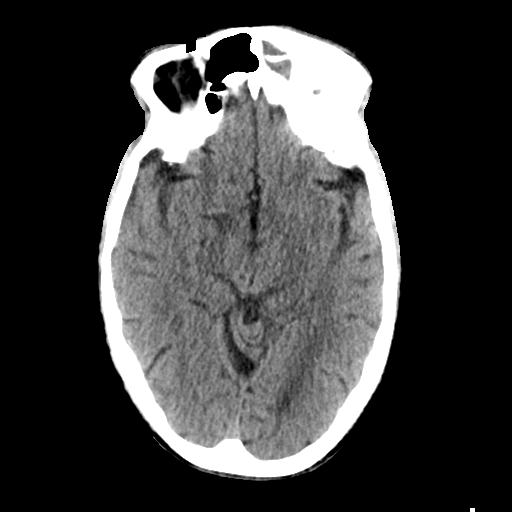
[im 17/33  brain]
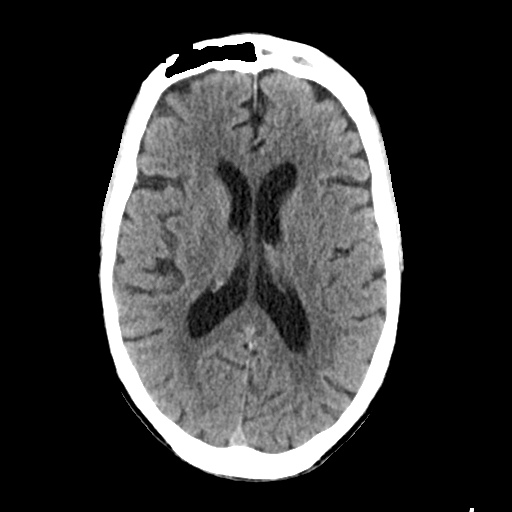
[im 21/33  brain]
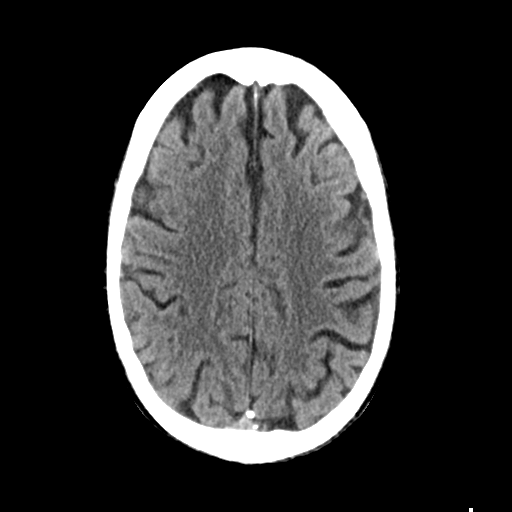
[im 21/33  bone]
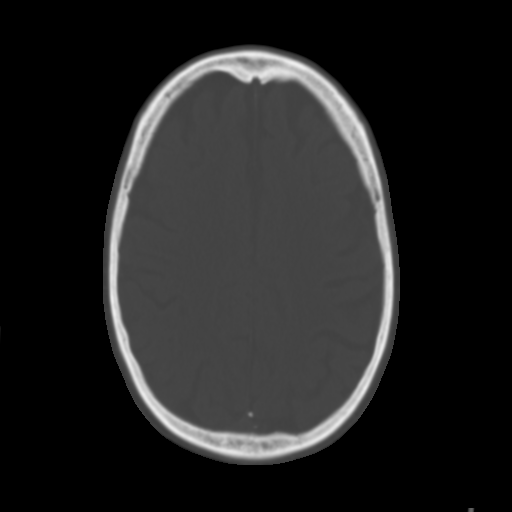
[im 25/33  brain]
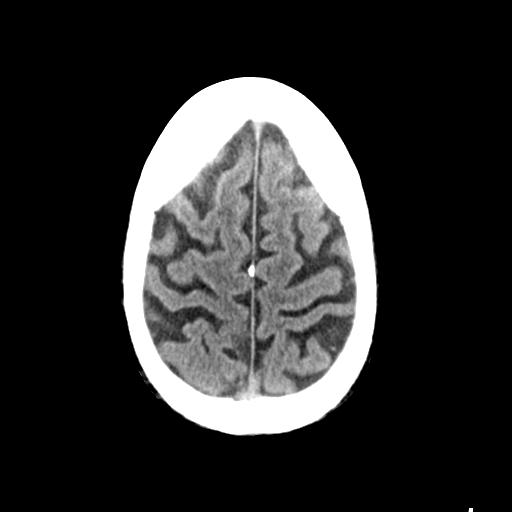
[im 29/33  brain]
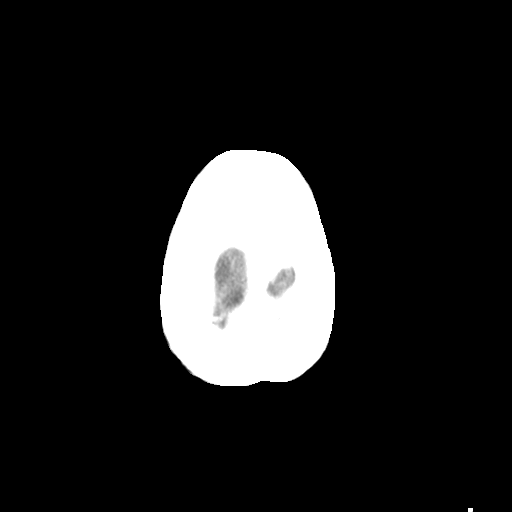

[Series 4: head bone · axial · 0.47mm/px · z∈[-61,-29]mm · 3 of 81 slices shown]
[im 9/81  bone]
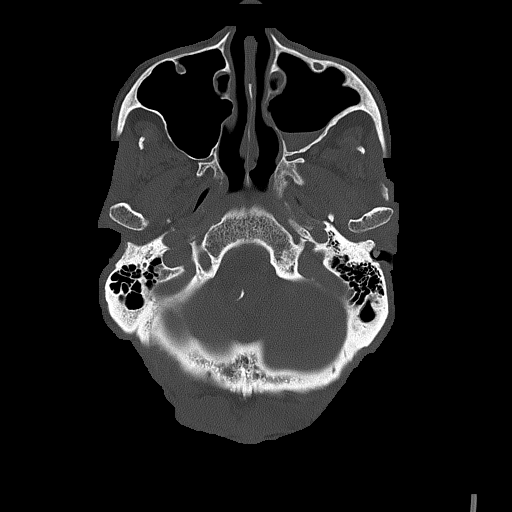
[im 17/81  bone]
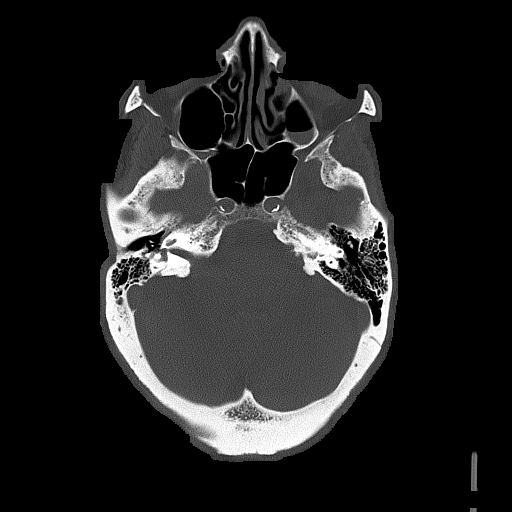
[im 25/81  bone]
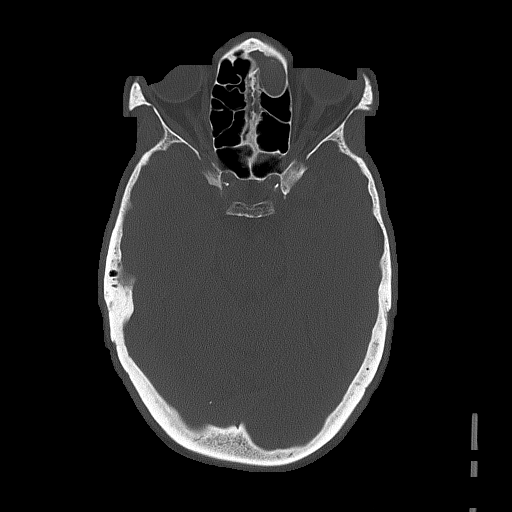

[Series 5: head without cor · coronal · non-contrast · 0.34mm/px · 3 of 76 slices shown]
[im 26/76  brain]
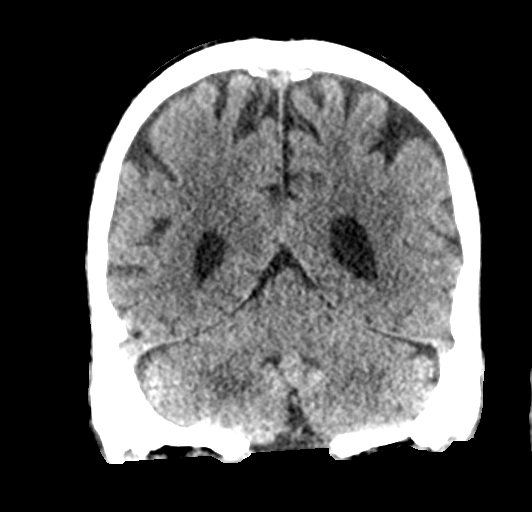
[im 34/76  brain]
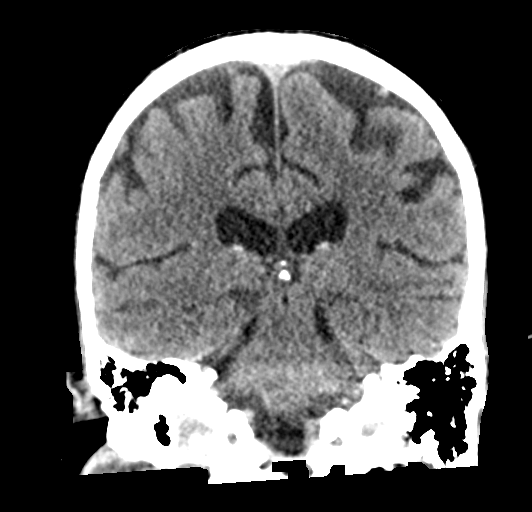
[im 42/76  brain]
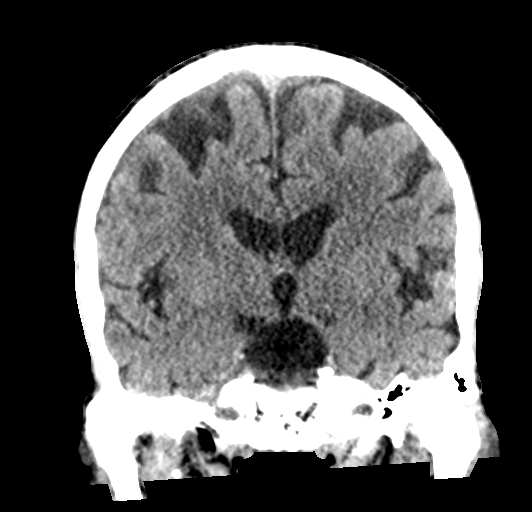

[Series 6: head without sag · sagittal · non-contrast · 0.31mm/px · 3 of 67 slices shown]
[im 23/67  brain]
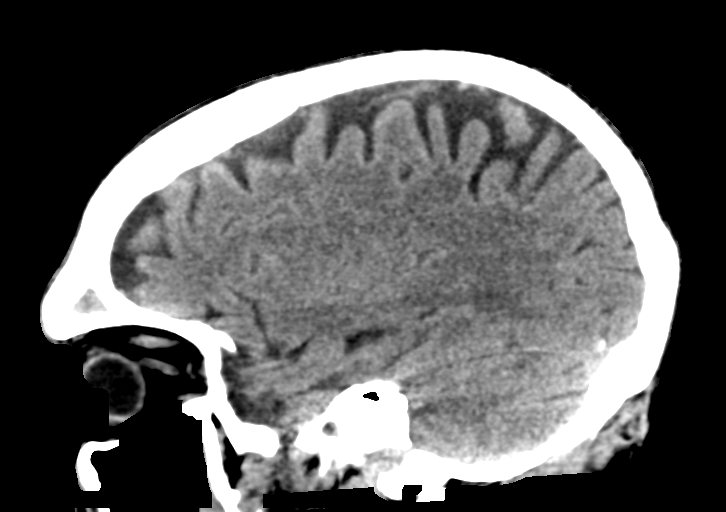
[im 34/67  brain]
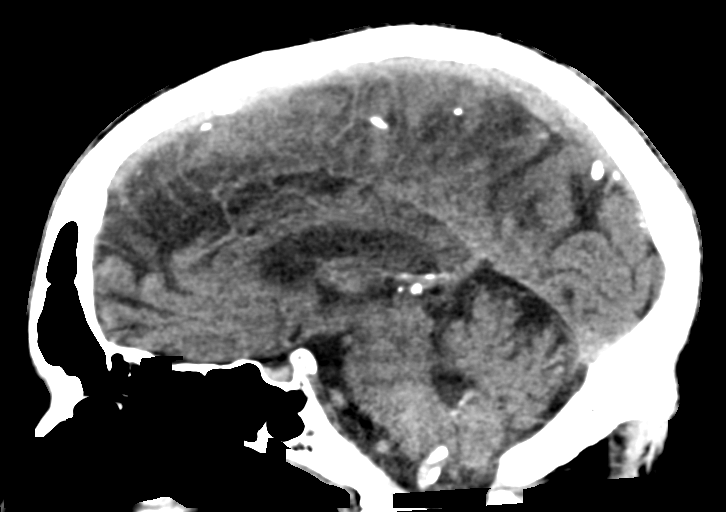
[im 45/67  brain]
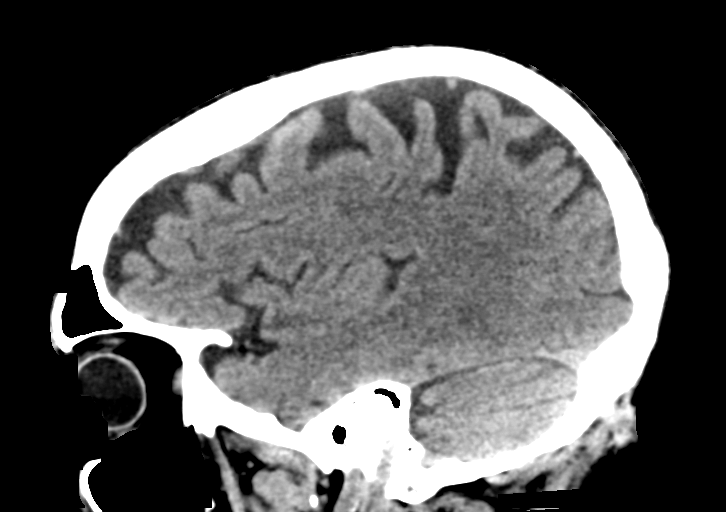

[16 of 47 positions shown; findings below may reference images not displayed]

FINDINGS: Brain: No evidence of acute infarction, hemorrhage, hydrocephalus,
extra-axial collection or mass lesion/mass effect.

Vascular: No hyperdense vessel or unexpected calcification.

Skull: Normal. Negative for fracture or focal lesion.

Sinuses/Orbits: Stable left frontal and ethmoid opacification.

Other: None.
IMPRESSION: No acute intracranial abnormality seen.

## 2021-07-14 MED ORDER — MECLIZINE HCL 25 MG PO TABS
25.0000 mg | ORAL_TABLET | Freq: Once | ORAL | Status: AC
  Administered 2021-07-14: 25 mg via ORAL
  Filled 2021-07-14: qty 1

## 2021-07-14 MED ORDER — SERTRALINE HCL 25 MG PO TABS: 25.0000 mg | ORAL_TABLET | Freq: Every day | ORAL | 2 refills | Status: DC

## 2021-07-14 NOTE — ED Notes (Signed)
Patient transported to MRI 

## 2021-07-14 NOTE — ED Notes (Signed)
Pt returned from MRI °

## 2021-07-14 NOTE — Progress Notes (Signed)
Subjective:    Patient ID: Matthew [name], male    DOB: 06-04-1940, 81 y.o.   MRN: [id number]  HPI Brief HPI:   Matthew [name] is a 81 y.o. male presented with acute exacerbation of chronic biventricular heart failure. CTA chest concerning for PE and stared on heparin infusion. Extensive cardiac work-up with findings of valvular heart disease and significant MR. Underwent TEER of MV on 4/20. Transitioned to Eliquis.     Hospital Course: Matthew [name] was admitted to rehab 06/08/2021 for inpatient therapies to consist of PT, ST and OT at least three hours five days a week. Past admission physiatrist, therapy team and rehab RN have worked together to provide customized collaborative inpatient rehab.  Sodium and potassium normal. Magnesium 2.1. Mild, brief episode of chest pain on 4/28 relieved with Maalox. No acute changes to EKG. Creatinine 2.1 and BUN elevated to 54.  Negative fluid balance 570 cc and incontinent of urine times 2 on 4/30. UA positive for leukocytes, hgb , bacteria. No nitrites. Follow-up labs on 5/1: creatinine now 2.38 and BUN is 61. Culture results positive for E. Coli. Started on Augmentin on 5/3 for 7 days. Consulted cardiology regarding torsemide and rising BUN/Cr. Hold torsemide and monitor weight. Torsemide restarted at lower dose 20 mg daily. SCr remained elevated at baseline value and plateaued. Torsemide changed to every other day dosing, nest dose on 5/9.  Potassium level normalized. SCr down to 2.02 on 5/9.     Blood pressures were monitored on TID basis and remained stable: average sys 100s dia 60s     Rehab course: During patient's stay in rehab weekly team conferences were held to monitor patient's progress, set goals and discuss barriers to discharge. At admission, patient required max assist with mobility mod assist with ADLs. SLP eval noted cognitive impairment.   He has had improvement in activity tolerance, balance, postural control as well as ability to  compensate for deficits. He has had improvement in functional use RUE/LUE  and RLE/LLE as well as improvement in awareness  HPI Interval history 81 year old male here for follow-up after his stay at [facility].  He is here with his daughter.  Recently seen by cardiology as an outpatient.  They report he has all his required medications.  He has continued to work with PT and OT at home.  He reports continuing to feel weak at times.  He says he does not feel well, His mood has been very decreased due to his functional deficits and he has decreased activity at home.  He reports he does not have a good appetite and has not been eating or drinking as much as usual. He does drink boost. He has a follow-up with his PCP in a few days and follow-up with cardiology scheduled.  Pain Inventory Average Pain 1 Pain Right Now 0 My pain is  weakness  In the last 24 hours, has pain interfered with the following? General activity 2 Relation with others 0 Enjoyment of life 0 What TIME of day is your pain at its worst? evening Sleep (in general) Good  Pain is worse with: walking Pain improves with:  none Relief from Meds:  na  walk with assistance use a Linam ability to climb steps?  no do you drive?  no use a wheelchair needs help with transfers  retired  weakness tremor trouble walking  Hospital f/u  Hospital f/u    Family History  Problem Relation Age of Onset   Hypertension  Father    Diabetes Brother    Heart attack Neg Hx    Stroke Neg Hx    Social History   Socioeconomic History   Marital status: Unknown    Spouse name: Not on file   Number of children: Not on file   Years of education: Not on file   Highest education level: 11th grade  Occupational History   Occupation: retired  Tobacco Use   Smoking status: Never   Smokeless tobacco: Never  Vaping Use   Vaping Use: Never used  Substance and Sexual Activity   Alcohol use: No   Drug use: No   Sexual activity: Not on file   Other Topics Concern   Not on file  Social History Narrative   Not on file   Social Determinants of Health   Financial Resource Strain: Low Risk    Difficulty of Paying Living Expenses: Not very hard  Food Insecurity: No Food Insecurity   Worried About Charity fundraiser in the Last Year: Never true   [place] in the Last Year: Never true  Transportation Needs: No Transportation Needs   Lack of Transportation (Medical): No   Lack of Transportation (Non-Medical): No  Physical Activity: Not on file  Stress: Not on file  Social Connections: Not on file   Past Surgical History:  Procedure Laterality Date   CHONDROPLASTY Left 01/11/2017   Procedure: CHONDROPLASTY of medial and patella compartment;  Surgeon: Dorna Leitz, MD;  Location: [place];  Service: Orthopedics;  Laterality: Left;   KNEE ARTHROSCOPY Left 01/11/2017   Procedure: ARTHROSCOPIC IRRIGATION AND DEBRIDMENT KNEE;  Surgeon: Dorna Leitz, MD;  Location: [place];  Service: Orthopedics;  Laterality: Left;   KNEE SURGERY     MENISECTOMY Left 01/11/2017   Procedure: Lateral MENISECTOMY;  Surgeon: Dorna Leitz, MD;  Location: [place];  Service: Orthopedics;  Laterality: Left;   MITRAL VALVE REPAIR N/A 06/02/2021   Procedure: MITRAL VALVE REPAIR;  Surgeon: Early Osmond, MD;  Location: [place] [facility];  Service: Cardiovascular;  Laterality: N/A;   RIGHT/LEFT HEART CATH AND CORONARY ANGIOGRAPHY N/A 05/18/2021   Procedure: RIGHT/LEFT HEART CATH AND CORONARY ANGIOGRAPHY;  Surgeon: Jolaine Artist, MD;  Location: [place] [facility];  Service: Cardiovascular;  Laterality: N/A;   SYNOVECTOMY Left 01/11/2017   Procedure: Four compartment SYNOVECTOMY;  Surgeon: Dorna Leitz, MD;  Location: [place];  Service: Orthopedics;  Laterality: Left;   TEE WITHOUT CARDIOVERSION N/A 05/18/2021   Procedure: TRANSESOPHAGEAL ECHOCARDIOGRAM (TEE);  Surgeon: Jolaine Artist, MD;  Location: [facility] ENDOSCOPY;  Service: Cardiovascular;  Laterality:  N/A;   TEE WITHOUT CARDIOVERSION N/A 06/02/2021   Procedure: TRANSESOPHAGEAL ECHOCARDIOGRAM (TEE);  Surgeon: Early Osmond, MD;  Location: [place] [facility];  Service: Cardiovascular;  Laterality: N/A;   Past Medical History:  Diagnosis Date   CAD (coronary artery disease)    Chronic HFrEF (heart failure with reduced ejection fraction) (HCC)    Chronic kidney disease, stage 3b (HCC)    Encephalopathy 04/2021   admitted for metabolic encephalopathy   HTN (hypertension)    Hyperlipidemia    NICM (nonischemic cardiomyopathy) ([place])    Obesity    Osteoarthritis    S/P mitral valve clip implantation 06/02/2021   MitraClip XTW x2 with Dr. Ali Lowe and Dr. Burt Knack   Severe mitral regurgitation    Thoracic aortic aneurysm (TAA) (HCC)    Thrombocytopenia ([place])    noted in labs in 2023   Tricuspid regurgitation  BP 103/63   Pulse (!) 56   SpO2 99%   Opioid Risk Score:   Fall Risk Score:  `1  Depression screen PHQ 2/9     07/14/2021    1:13 PM 01/30/2017    9:09 AM 11/21/2016    2:19 PM  Depression screen PHQ 2/9  Decreased Interest 0 0 0  Down, Depressed, Hopeless 0 1 0  PHQ - 2 Score 0 1 0     Review of Systems  Musculoskeletal:  Positive for gait problem.       Left knee pain  Neurological:  Positive for tremors and weakness.  All other systems reviewed and are negative.     Objective:   Physical Exam Today's Vitals   07/14/21 1312  BP: 103/63  Pulse: (!) 56  SpO2: 99%   There is no height or weight on file to calculate BMI.   Gen: no distress, normal appearing, in a wheelchair HEENT: oral mucosa pink and moist, NCAT Cardio: RRR Chest: normal effort, normal rate of breathing Abd: soft, non-distended Ext: no edema Psych: Appears sad, decreased affect Skin: intact Neuro: Alert and oriented x3, cranial nerves grossly intact, follows commands and answers questions Musculoskeletal: No focal deficits noted he is moving all 4 extremities, sensation intact in all  4 extremities, strength 4+ out of 5 in all 4 extremities No joint swelling or tenderness Tone appears to be normal     Assessment & Plan:  Functional deficits due to heart failure/debility -Continue follow-up with cardiology -Continue PT and OT at home -Denies CP or SOB  Depression, mood appears much decreased from prior, decreased appetite -We will start Zoloft 25 mg daily, to help with mood, f/u with PCP  chronic biventricular heart failure -- continue midodrine 15 mg 3 times daily -Continue medications for heart failure as directed by cardiology - continue to follow daily weights -Advised to contact cardiology if he develops feeling orthostatic or excessively dehydrated, may need adjustment of diuretics, ER if unable to contact them  Chronic kidney disease stage IIIb; -Discussed importance of diet and fluid intake to prevent dehydration

## 2021-07-14 NOTE — ED Triage Notes (Addendum)
Pt arrived to ED from PT (pt lives at home with his daughter). Pt has had increased generalized weakness x 1 month and has felt "swimmy headed" for the past 3 days. Pt has had decreased appetite and reports worsening depression along w/ depression medication change recently. Pt reports his main complaint is dizziness. He reports that it is like the room is slanted. VSS w/ EMS.

## 2021-07-14 NOTE — ED Provider Notes (Signed)
[facility] EMERGENCY DEPARTMENT Provider Note   CSN: [id number] Arrival date & time:        History  Chief Complaint  Patient presents with   Depression   Dizziness    Matthew [name] is a 81 y.o. male with a history of CHF, CAD, HTN, severe MR s/p mTEER, CKD, HLD presenting to the ED with generalized weakness.  Patient states that over the past few days he has felt more weak and unable to ambulate as far with his Rubiano.  He also endorses a sensation of "head swimming" for the last few days that comes on intermittently.  He states he notices it when he is laying down in bed and intermittently with walking.  He states that he feels as if everything looks like it is "on a tilt".  Denies room spinning or syncope.  Further history obtained from the patient's daughter over the phone (who he lives with).  She states she is concerned because he has not been eating or drinking well for the past several weeks.  He was seen by his PCP today and the plan was to start him on antidepressants (but patient has not started this medication yet).  Patient does note that he feels sad but denies suicidal ideation.  He denies any recent falls, though patient's daughter states that he has had a few falls at home (the last of which was 2 weeks ago) and they typically happen when he attempts to ambulate with his Raynes on his own.  Patient denies any recent fevers, cough, chest pain, shortness of breath, abdominal pain, nausea/vomiting/diarrhea, dysuria.   Depression Pertinent negatives include no chest pain, no abdominal pain, no headaches and no shortness of breath.  Dizziness Associated symptoms: weakness   Associated symptoms: no chest pain, no diarrhea, no headaches, no nausea, no shortness of breath and no vomiting       Home Medications Prior to Admission medications   Medication Sig Start Date End Date Taking? Authorizing Provider  acetaminophen (TYLENOL) 325 MG tablet Take 1-2  tablets (325-650 mg total) by mouth every 4 (four) hours as needed for mild pain. 06/22/21   Setzer, Edman Circle, PA-C  amiodarone (PACERONE) 400 MG tablet Take 1 tablet (400 mg total) by mouth 2 (two) times daily. 06/23/21   Jettie Booze, MD  apixaban (ELIQUIS) 5 MG TABS tablet Take 1 tablet (5 mg total) by mouth 2 (two) times daily. 06/22/21   Setzer, Edman Circle, PA-C  atorvastatin (LIPITOR) 10 MG tablet Take 1 tablet (10 mg total) by mouth daily. 06/22/21   Setzer, Edman Circle, PA-C  leptospermum manuka honey (MEDIHONEY) PSTE paste Apply 1 application. topically daily. 06/23/21   Setzer, Edman Circle, PA-C  magnesium oxide (MAG-OX) 400 (240 Mg) MG tablet Take 1 tablet (400 mg total) by mouth daily. 06/22/21   Setzer, Edman Circle, PA-C  mexiletine (MEXITIL) 150 MG capsule Take 2 capsules (300 mg total) by mouth every 12 (twelve) hours. 06/22/21   Setzer, Edman Circle, PA-C  midodrine (PROAMATINE) 5 MG tablet Take 3 tablets (15 mg total) by mouth 3 (three) times daily with meals. 06/23/21   Jettie Booze, MD  pantoprazole (PROTONIX) 40 MG tablet Take 1 tablet (40 mg total) by mouth daily. 06/23/21   Jettie Booze, MD  sertraline (ZOLOFT) 25 MG tablet Take 1 tablet (25 mg total) by mouth daily. 07/14/21 07/14/22  Jennye Boroughs, MD  torsemide (DEMADEX) 20 MG tablet Take 1 tablet (20 mg total)  by mouth every other day. 06/23/21   Corky Crafts, MD  traMADol (ULTRAM) 50 MG tablet Take 50 mg by mouth every 8 (eight) hours as needed.    [provider]      Allergies    Patient has no known allergies.    Review of Systems   Review of Systems  Constitutional:  Negative for fever.  Respiratory:  Negative for cough and shortness of breath.   Cardiovascular:  Negative for chest pain.  Gastrointestinal:  Negative for abdominal pain, diarrhea, nausea and vomiting.  Genitourinary:  Negative for dysuria.  Neurological:  Positive for dizziness (feeling as if worl is "on a tilt") and weakness.  Negative for seizures, syncope, speech difficulty, light-headedness, numbness and headaches.  Psychiatric/Behavioral:  Positive for depression.    Physical Exam Updated Vital Signs BP 124/83   Pulse 60   Temp 97.8 F (36.6 C) (Oral)   Resp 17   Ht 6\' 7"  (2.007 m)   Wt 74.8 kg   SpO2 98%   BMI 18.59 kg/m  Physical Exam Constitutional:      General: He is not in acute distress.    Appearance: He is not toxic-appearing or diaphoretic.     Comments: Elderly and chronically ill-appearing.  HENT:     Head: Normocephalic and atraumatic.     Right Ear: External ear normal.     Left Ear: External ear normal.     Nose: Nose normal.     Mouth/Throat:     Pharynx: Oropharynx is clear.  Eyes:     General: No scleral icterus.    Extraocular Movements: Extraocular movements intact.     Pupils: Pupils are equal, round, and reactive to light.  Cardiovascular:     Rate and Rhythm: Regular rhythm. Bradycardia present.     Heart sounds: Murmur (systolic) heard.    No friction rub. No gallop.  Pulmonary:     Effort: Pulmonary effort is normal. No respiratory distress.     Breath sounds: Normal breath sounds. No stridor. No wheezing, rhonchi or rales.  Abdominal:     General: There is no distension.     Palpations: Abdomen is soft.     Tenderness: There is no abdominal tenderness. There is no guarding or rebound.  Musculoskeletal:        General: No deformity.     Cervical back: Neck supple.     Right lower leg: Edema (trace) present.     Left lower leg: Edema (trace) present.  Skin:    General: Skin is warm and dry.     Comments: Healing wound over the right shin.  Neurological:     Mental Status: He is alert and oriented to person, place, and time.     Cranial Nerves: No cranial nerve deficit.     Sensory: No sensory deficit.     Comments: LLE is slightly weaker compared to the RLE. BUE are equal in strength.    ED Results / Procedures / Treatments   Labs (all labs ordered are  listed, but only abnormal results are displayed) Labs Reviewed  CBC WITH DIFFERENTIAL/PLATELET - Abnormal; Notable for the following components:      Result Value   Hemoglobin 11.3 (*)    HCT 36.5 (*)    RDW 19.8 (*)    All other components within normal limits  COMPREHENSIVE METABOLIC PANEL - Abnormal; Notable for the following components:   Sodium 133 (*)    BUN 46 (*)  Creatinine, Ser 2.25 (*)    Calcium 8.6 (*)    Albumin 3.3 (*)    GFR, Estimated 29 (*)    All other components within normal limits  URINALYSIS, ROUTINE W REFLEX MICROSCOPIC - Abnormal; Notable for the following components:   APPearance HAZY (*)    Leukocytes,Ua SMALL (*)    Bacteria, UA RARE (*)    All other components within normal limits  BRAIN NATRIURETIC PEPTIDE - Abnormal; Notable for the following components:   B Natriuretic Peptide 851.5 (*)    All other components within normal limits  MAGNESIUM  TROPONIN I (HIGH SENSITIVITY)  TROPONIN I (HIGH SENSITIVITY)    EKG EKG Interpretation  Date/Time:  Thursday July 14 2021 17:06:26 EDT Ventricular Rate:  64 PR Interval:  221 QRS Duration: 206 QT Interval:  583 QTC Calculation: 602 R Axis:   204 Text Interpretation: Sinus rhythm Prolonged PR interval Probable left atrial enlargement Right bundle branch block nonspecific changes since previous Confirmed by Wandra Arthurs [id number]) on 07/14/2021 5:09:35 PM  Radiology CT Head Wo Contrast  Result Date: 07/14/2021 CLINICAL DATA:  Dizziness. EXAM: CT HEAD WITHOUT CONTRAST TECHNIQUE: Contiguous axial images were obtained from the base of the skull through the vertex without intravenous contrast. RADIATION DOSE REDUCTION: This exam was performed according to the departmental dose-optimization program which includes automated exposure control, adjustment of the mA and/or kV according to patient size and/or use of iterative reconstruction technique. COMPARISON:  May 07, 2021. FINDINGS: Brain: No evidence of acute  infarction, hemorrhage, hydrocephalus, extra-axial collection or mass lesion/mass effect. Vascular: No hyperdense vessel or unexpected calcification. Skull: Normal. Negative for fracture or focal lesion. Sinuses/Orbits: Stable left frontal and ethmoid opacification. Other: None. IMPRESSION: No acute intracranial abnormality seen. Electronically Signed   By: Marijo Conception M.D.   On: 07/14/2021 16:39   MR BRAIN WO CONTRAST  Result Date: 07/14/2021 CLINICAL DATA:  Initial evaluation for acute dizziness. EXAM: MRI HEAD WITHOUT CONTRAST TECHNIQUE: Multiplanar, multiecho pulse sequences of the brain and surrounding structures were obtained without intravenous contrast. COMPARISON:  CT from earlier the same day. FINDINGS: Brain: Cerebral volume within normal limits. Mild patchy T2/FLAIR hyperintensity involving the periventricular deep white matter both cerebral hemispheres, most consistent with chronic small vessel ischemic disease. Changes are mild for age. Tiny remote left cerebellar infarct noted. No evidence for acute or subacute infarct. Gray-white matter differentiation maintained. No areas of chronic cortical infarction. No acute intracranial hemorrhage. Few punctate chronic micro hemorrhages noted within the supratentorial cerebral white matter, likely small vessel related. No mass lesion, midline shift or mass effect. No hydrocephalus or extra-axial fluid collection. Pituitary gland suprasellar region normal. Vascular: Major intracranial vascular flow voids are maintained. Right V4 segment tortuous and partially invaginate upon the adjacent medulla. Skull and upper cervical spine: Craniocervical junction within normal limits. Degenerative spondylosis at C3-4 with mild spinal stenosis, partially visualized. Bone marrow signal intensity within normal limits. No scalp soft tissue abnormality. Sinuses/Orbits: Globes orbital soft tissues demonstrate no acute finding. Moderate mucosal thickening present within the  left paranasal sinuses with superimposed air-fluid level within the left maxillary sinus. No significant mastoid effusion. Inner ear structures grossly normal. Other: None. IMPRESSION: 1. No acute intracranial abnormality. 2. Mild chronic microvascular ischemic disease for age, with tiny remote left cerebellar infarct. 3. Acute left paranasal sinusitis. Electronically Signed   By: Jeannine Boga M.D.   On: 07/14/2021 21:52   DG Chest Portable 1 View  Result Date: 07/14/2021 CLINICAL DATA:  Increased generalized weakness x1 month. EXAM: PORTABLE CHEST 1 VIEW COMPARISON:  June 03, 2021 FINDINGS: There is been interval removal of the previously noted endotracheal tube, nasogastric tube, right internal low lung volumes are noted with mild atelectasis and/or early infiltrate seen within the left lung base. There is no evidence of a pleural effusion or pneumothorax. The visualized skeletal structures are unremarkable. IMPRESSION: Low lung volumes with mild left basilar atelectasis and/or early infiltrate. Electronically Signed   By: Virgina Norfolk M.D.   On: 07/14/2021 17:31    Procedures Procedures    Medications Ordered in ED Medications  meclizine (ANTIVERT) tablet 25 mg (25 mg Oral Given 07/14/21 1836)    ED Course/ Medical Decision Making/ A&P                           Medical Decision Making Amount and/or Complexity of Data Reviewed Labs: ordered. Radiology: ordered.    Matthew [name] is a 81 y.o. male with a history of CHF, CAD, HTN, severe MR s/p mTEER, CKD, HLD presenting to the ED with generalized weakness.  On exam, the patient is afebrile, hemodynamically stable, elderly, chronically ill-appearing.  His left lower extremity does appear slightly weaker than his right lower extremity, but he has no other focal neurologic deficits.  His pulmonary cardiac exam are unremarkable.  Differential diagnosis includes ACS, UTI, anemia, pneumonia, electrolyte abnormality, CHF  exacerbation, stroke.  I spoke at length with the patient's daughter over the phone who states that he has not been eating and drinking well for the past several weeks.  He is generally weak, but she states that this has been a persistent issue since his discharge from the hospital.  She states he is able to get around with a Coger but needs assistance with this as well.  Overall she states that she feels he is safe at home with her and he continues to get physical therapy.  CT head was obtained and did not show any acute abnormalities.  Chest x-ray shows overall low lung volumes with mild left basilar atelectasis.  BNP is elevated to 850.  On chart review, when patient is in a CHF exacerbation his BNP is typically greater than 4500.  He does have some trace edema on exam today, but no pulmonary edema on chest x-ray and no crackles on his pulmonary exam, so very low suspicion for CHF exacerbation.  Troponin is 14x2.  UA is not concerning for infection.  CBC with no leukocytosis.  Hemoglobin is 11.3 which appears to be his baseline.  CMP notable for a creatinine of 2.25 and a BUN of 46, both of which are close to the patient's baseline.  Magnesium is 2.4.  While patient's head CT is normal, he does have some slight left lower extremity weakness as compared to the right and is complaining of these "dizzy" spells and feeling as if "everything is tilted".  Given the symptoms, I am concerned for possible stroke, so MRI of the brain was obtained.  MRI brain does not show any acute stroke.  Given the patient's negative work-up, do feel he safe for discharge.  I spoke with the patient's daughter over the phone and discussed all of the lab and imaging results with her.  She is comfortable with discharge back home with her.  I also discussed the lab work and imaging with the patient and he also feels comfortable with discharge.  I have recommended he follow-up with  his PCP within the next week.  Strict return  precautions were discussed and the patient was discharged home in stable condition.        Final Clinical Impression(s) / ED Diagnoses Final diagnoses:  Dizziness  Weakness    Rx / DC Orders ED Discharge Orders     None         Sondra Come, MD 07/14/21 2323    Drenda Freeze, MD 07/20/21 1504

## 2021-07-14 NOTE — Discharge Instructions (Addendum)
You were seen in the emergency department for generalized weakness and dizziness.  You had lab work that showed stable kidney function, normal electrolytes, and stable blood levels.  He also had a CT head that did not show any bleeding in the brain.  You had an MRI of the brain which did not show any new stroke.  He also had a chest x-ray that did not show any pneumonia.  Please follow-up with your primary care doctor within the next week.  If your symptoms worsen or you have new symptoms of chest pain, shortness of breath, or lightheadedness, please return to the emergency department.

## 2021-07-14 NOTE — ED Notes (Signed)
Lab adding magnesium to previous sample

## 2021-07-22 ENCOUNTER — Telehealth: Payer: Self-pay | Admitting: Interventional Cardiology

## 2021-07-22 ENCOUNTER — Ambulatory Visit (HOSPITAL_COMMUNITY)
Admission: RE | Admit: 2021-07-22 | Discharge: 2021-07-22 | Disposition: A | Discharge status: Home / Self Care | Payer: Medicare Other | Source: Ambulatory Visit | Attending: Cardiology | Admitting: Cardiology

## 2021-07-22 ENCOUNTER — Other Ambulatory Visit (HOSPITAL_COMMUNITY): Payer: Self-pay | Admitting: *Deleted

## 2021-07-22 DIAGNOSIS — I5022 Chronic systolic (congestive) heart failure: Secondary | ICD-10-CM | POA: Diagnosis present

## 2021-07-22 LAB — BASIC METABOLIC PANEL
Anion gap: 7 (ref 5–15)
BUN: 43 mg/dL — ABNORMAL HIGH (ref 8–23)
CO2: 23 mmol/L (ref 22–32)
Calcium: 9.1 mg/dL (ref 8.9–10.3)
Chloride: 106 mmol/L (ref 98–111)
Creatinine, Ser: 2.08 mg/dL — ABNORMAL HIGH (ref 0.61–1.24)
GFR, Estimated: 32 mL/min — ABNORMAL LOW (ref >60)
Glucose, Bld: 93 mg/dL (ref 70–99)
Potassium: 4.7 mmol/L (ref 3.5–5.1)
Sodium: 136 mmol/L (ref 135–145)

## 2021-07-22 MED ORDER — MEXILETINE HCL 150 MG PO CAPS: 300.0000 mg | ORAL_CAPSULE | Freq: Two times a day (BID) | ORAL | 6 refills | Status: DC

## 2021-07-22 MED ORDER — APIXABAN 5 MG PO TABS: 5.0000 mg | ORAL_TABLET | Freq: Two times a day (BID) | ORAL | 6 refills | Status: DC

## 2021-07-22 MED ORDER — MAGNESIUM OXIDE -MG SUPPLEMENT 400 (240 MG) MG PO TABS: 400.0000 mg | ORAL_TABLET | Freq: Every day | ORAL | 6 refills | Status: DC

## 2021-07-22 NOTE — Telephone Encounter (Signed)
This is a CHF pt. Please address 

## 2021-07-22 NOTE — Telephone Encounter (Signed)
*  STAT* If patient is at the pharmacy, call can be transferred to refill team.   1. Which medications need to be refilled? (please list name of each medication and dose if known)   apixaban (ELIQUIS) 5 MG TABS tablet  magnesium oxide (MAG-OX) 400 (240 Mg) MG tablet  mexiletine (MEXITIL) 150 MG capsule   2. Which pharmacy/location (including street and city if local pharmacy) is medication to be sent to? [facility] [phone] - [place], [place] - [address] AT [address]  3. Do they need a 30 day or 90 day supply?  60 day

## 2021-07-26 ENCOUNTER — Ambulatory Visit (INDEPENDENT_AMBULATORY_CARE_PROVIDER_SITE_OTHER): Payer: Medicare Other | Admitting: Interventional Cardiology

## 2021-07-26 ENCOUNTER — Encounter: Payer: Self-pay | Admitting: Interventional Cardiology

## 2021-07-26 VITALS — BP 108/70 | HR 65 | Ht 72.0 in | Wt 164.0 lb

## 2021-07-26 DIAGNOSIS — I5022 Chronic systolic (congestive) heart failure: Secondary | ICD-10-CM

## 2021-07-26 DIAGNOSIS — I340 Nonrheumatic mitral (valve) insufficiency: Secondary | ICD-10-CM

## 2021-07-26 DIAGNOSIS — I5082 Biventricular heart failure: Secondary | ICD-10-CM | POA: Diagnosis not present

## 2021-07-26 DIAGNOSIS — I493 Ventricular premature depolarization: Secondary | ICD-10-CM

## 2021-07-26 DIAGNOSIS — I2510 Atherosclerotic heart disease of native coronary artery without angina pectoris: Secondary | ICD-10-CM | POA: Diagnosis not present

## 2021-07-26 DIAGNOSIS — Z9889 Other specified postprocedural states: Secondary | ICD-10-CM | POA: Diagnosis not present

## 2021-07-26 DIAGNOSIS — N183 Chronic kidney disease, stage 3 unspecified: Secondary | ICD-10-CM

## 2021-07-26 DIAGNOSIS — Z95818 Presence of other cardiac implants and grafts: Secondary | ICD-10-CM

## 2021-07-26 NOTE — Progress Notes (Signed)
Cardiology Office Note   Date:  07/26/2021   ID:  [name], DOB September 25, 1940, MRN [id number]  PCP:  Alyson Ingles, PA-C    Chief Complaint  Patient presents with   Follow-up     Wt Readings from Last 3 Encounters:  07/26/21 164 lb (74.4 kg)  07/14/21 165 lb (74.8 kg)  06/22/21 184 lb 1.4 oz (83.5 kg)       History of Present Illness: Matthew [name] is a 81 y.o. male  with prior NICM,  who has had issues with HTN and obesity. He lost some weight years ago.  He has been as high as 345 lbs.  Prior cardiomyopathy: LVEF normalized to 50-55% in 2014.  Mildly dilated aortic root at that time.    He had staph arthritis, MSSA in 12/18.  Followed by ID. Tolerated antibiotics longterm.  He required surgery in 12/18 and tolerated this well.   2023 CHF clinic records show: " with h/o NICM/chronic systolic HF, CAD, HTN, obesity and MR s/p mTEER in 4/23.    Admitted 03/2021 with a/c CHF. Had not been taking his CHF medications or diuretics several days prior to admission. BNP on admission was >4,500.  Echo LVEF 20 to 25%, RV severely enlarged and severely reduced, severe mitral and tricuspid regurgitation, severe BAE, severely elevated RVSP, 50 mmHg.    Admitted 05/08/21 with AKI and metabolic encephalopathy.    Seen in [facility] clinic on 05/13/21. Volume appeared elevated.  PO lasix increased. R/LHC + TEE arranged for additional workup of severely reduced EF and mitral regurgitation.    Presented to the ED on 05/13/21 with complaints of dyspnea and CP. CTA chest with evidence of PE. BNP > 4,500. He was admitted for management of PE and acute on chronic biventricular heart failure.    Cath 4/23. Mild non-obstructive CAD EF < 20%. RHC ok.   Echo 05/14/21: EF 25%, LV severely dilated, heavy trabeculations with no evidence of thrombus, mildly dildated RV with moderately decreased systolic function, severe BAE, severe MR, severe TR, dilated IVC suggesting RAP > 15 mmHg   Reduced EF  felt to be possibly due to frequent PVCs but EF not improving with attempts at PVC suppression. Unable to wean off milrinone. Underwent mTEER on 06/02/21. He was hypoxic post procedure and was intubated. Extubated on 04/21. Still with 3+ MR post TEER but improved clinically. He was weaned off milrinone on 04/24.   Discharged to CIR. Discharged home 06/22/21 weight 180 pounds."  Has continued to lose weight.  Appetite decreased.    Denies : Chest pain. Dizziness. Leg edema. Nitroglycerin use. Orthopnea. Palpitations. Paroxysmal nocturnal dyspnea. Shortness of breath. Syncope.    Some unsteadiness with walking.  No falls.     Past Medical History:  Diagnosis Date   CAD (coronary artery disease)    Chronic HFrEF (heart failure with reduced ejection fraction) (HCC)    Chronic kidney disease, stage 3b (HCC)    Encephalopathy 04/2021   admitted for metabolic encephalopathy   HTN (hypertension)    Hyperlipidemia    NICM (nonischemic cardiomyopathy) (HCC)    Obesity    Osteoarthritis    S/P mitral valve clip implantation 06/02/2021   MitraClip XTW x2 with Dr. Lynnette Caffey and Dr. Excell Seltzer   Severe mitral regurgitation    Thoracic aortic aneurysm (TAA) (HCC)    Thrombocytopenia (HCC)    noted in labs in 2023   Tricuspid regurgitation     Past Surgical History:  Procedure  Laterality Date   CHONDROPLASTY Left 01/11/2017   Procedure: CHONDROPLASTY of medial and patella compartment;  Surgeon: Dorna Leitz, MD;  Location: [place];  Service: Orthopedics;  Laterality: Left;   KNEE ARTHROSCOPY Left 01/11/2017   Procedure: ARTHROSCOPIC IRRIGATION AND DEBRIDMENT KNEE;  Surgeon: Dorna Leitz, MD;  Location: [place];  Service: Orthopedics;  Laterality: Left;   KNEE SURGERY     MENISECTOMY Left 01/11/2017   Procedure: Lateral MENISECTOMY;  Surgeon: Dorna Leitz, MD;  Location: [place];  Service: Orthopedics;  Laterality: Left;   MITRAL VALVE REPAIR N/A 06/02/2021   Procedure: MITRAL VALVE REPAIR;  Surgeon:  Early Osmond, MD;  Location: [place] [facility];  Service: Cardiovascular;  Laterality: N/A;   RIGHT/LEFT HEART CATH AND CORONARY ANGIOGRAPHY N/A 05/18/2021   Procedure: RIGHT/LEFT HEART CATH AND CORONARY ANGIOGRAPHY;  Surgeon: Jolaine Artist, MD;  Location: [place] [facility];  Service: Cardiovascular;  Laterality: N/A;   SYNOVECTOMY Left 01/11/2017   Procedure: Four compartment SYNOVECTOMY;  Surgeon: Dorna Leitz, MD;  Location: [place];  Service: Orthopedics;  Laterality: Left;   TEE WITHOUT CARDIOVERSION N/A 05/18/2021   Procedure: TRANSESOPHAGEAL ECHOCARDIOGRAM (TEE);  Surgeon: Jolaine Artist, MD;  Location: [facility];  Service: Cardiovascular;  Laterality: N/A;   TEE WITHOUT CARDIOVERSION N/A 06/02/2021   Procedure: TRANSESOPHAGEAL ECHOCARDIOGRAM (TEE);  Surgeon: Early Osmond, MD;  Location: [place] [facility];  Service: Cardiovascular;  Laterality: N/A;     Current Outpatient Medications  Medication Sig Dispense Refill   acetaminophen (TYLENOL) 325 MG tablet Take 1-2 tablets (325-650 mg total) by mouth every 4 (four) hours as needed for mild pain.     amiodarone (PACERONE) 400 MG tablet Take 1 tablet (400 mg total) by mouth 2 (two) times daily. 180 tablet 0   apixaban (ELIQUIS) 5 MG TABS tablet Take 1 tablet (5 mg total) by mouth 2 (two) times daily. 60 tablet 6   atorvastatin (LIPITOR) 10 MG tablet Take 1 tablet (10 mg total) by mouth daily. 30 tablet 0   leptospermum manuka honey (MEDIHONEY) PSTE paste Apply 1 application. topically daily.     magnesium oxide (MAG-OX) 400 (240 Mg) MG tablet Take 1 tablet (400 mg total) by mouth daily. 30 tablet 6   mexiletine (MEXITIL) 150 MG capsule Take 2 capsules (300 mg total) by mouth every 12 (twelve) hours. 60 capsule 6   midodrine (PROAMATINE) 5 MG tablet Take 3 tablets (15 mg total) by mouth 3 (three) times daily with meals. 810 tablet 0   pantoprazole (PROTONIX) 40 MG tablet Take 1 tablet (40 mg total) by mouth daily. 90 tablet  0   sertraline (ZOLOFT) 25 MG tablet Take 1 tablet (25 mg total) by mouth daily. 30 tablet 2   torsemide (DEMADEX) 20 MG tablet Take 1 tablet (20 mg total) by mouth every other day. 45 tablet 0   traMADol (ULTRAM) 50 MG tablet Take 50 mg by mouth every 8 (eight) hours as needed.     No current facility-administered medications for this visit.    Allergies:   Patient has no known allergies.    Social History:  The patient  reports that he has never smoked. He has never used smokeless tobacco. He reports that he does not drink alcohol and does not use drugs.   Family History:  The patient's family history includes Diabetes in his brother; Hypertension in his father.    ROS:  Please see the history of present illness.   Otherwise, review of  systems are positive for weight loss.   All other systems are reviewed and negative.    PHYSICAL EXAM: VS:  BP 108/70   Pulse 65   Ht 6' (1.829 m)   Wt 164 lb (74.4 kg)   SpO2 99%   BMI 22.24 kg/m  , BMI Body mass index is 22.24 kg/m. GEN: Thin, well developed, in no acute distress HEENT: normal Neck: no JVD, carotid bruits, or masses Cardiac: RRR; 2/6 systolic murmur, no rubs, or gallops,no edema  Respiratory:  clear to auscultation bilaterally, normal work of breathing GI: soft, nontender, nondistended, + BS MS: no deformity or atrophy Skin: warm and dry, no rash Neuro:  Strength and sensation are intact Psych: euthymic mood, full affect    Recent Labs: 07/08/2021: TSH 2.099 07/14/2021: ALT 33; B Natriuretic Peptide 851.5; Hemoglobin 11.3; Magnesium 2.4; Platelets 173 07/22/2021: BUN 43; Creatinine, Ser 2.08; Potassium 4.7; Sodium 136   Lipid Panel    Component Value Date/Time   CHOL 97 (L) 05/10/2020 0908   TRIG 54 05/10/2020 0908   HDL 40 05/10/2020 0908   CHOLHDL 2.4 05/10/2020 0908   CHOLHDL 3.2 06/11/2015 0852   VLDL 14 06/11/2015 0852   LDLCALC 44 05/10/2020 0908     Other studies Reviewed: Additional studies/ records  that were reviewed today with results demonstrating: labs reviewed- Cr2.0- 2.3 range.   ASSESSMENT AND PLAN:  Prior NICM: EF 25-30%.  Appears euvolemic.  Avoid nephrotoxins. CHF therapy limited by CRI MR: s/p TEER.  Still with mod to severe MR.  HTN:  Home readings in the 123456 systolic.  The current medical regimen is effective;  continue present plan and medications. Obesity: resolved.  Getting HHPT.  Gaining strength is the next important step for his recovery. CAD: small vessel disease noted in the past.  No angina on medical therapy. Hyperlipidemia: The current medical regimen is effective;  continue present plan and medications. Anticoagulated:Eliquis for stroke prevention. Prior PE.  PVCs: Amio and Mexitil.     Current medicines are reviewed at length with the patient today.  The patient concerns regarding his medicines were addressed.  The following changes have been made:  No change  Labs/ tests ordered today include:  No orders of the defined types were placed in this encounter.   Recommend 150 minutes/week of aerobic exercise Low fat, low carb, high fiber diet recommended  Disposition:   FU in 1 year; has f/u with CHF and structural team   Signed, Larae Grooms, MD  07/26/2021 8:21 AM    [address], [place], [place]  [id number] Phone: [phone]; Fax: [phone]

## 2021-07-26 NOTE — Patient Instructions (Signed)

## 2021-07-31 ENCOUNTER — Other Ambulatory Visit: Payer: Self-pay | Admitting: Interventional Cardiology

## 2021-08-06 ENCOUNTER — Ambulatory Visit: Payer: Medicare Other

## 2021-08-07 ENCOUNTER — Ambulatory Visit
Admission: RE | Admit: 2021-08-07 | Discharge: 2021-08-07 | Disposition: A | Discharge status: Home / Self Care | Payer: Medicare Other | Source: Ambulatory Visit | Attending: Physician Assistant | Admitting: Physician Assistant

## 2021-08-07 VITALS — BP 94/55 | HR 63 | Temp 98.1°F | Resp 16

## 2021-08-07 DIAGNOSIS — N390 Urinary tract infection, site not specified: Secondary | ICD-10-CM | POA: Diagnosis present

## 2021-08-07 LAB — POCT URINALYSIS DIP (MANUAL ENTRY)
Bilirubin, UA: NEGATIVE
Blood, UA: NEGATIVE
Glucose, UA: NEGATIVE mg/dL
Ketones, POC UA: NEGATIVE mg/dL
Nitrite, UA: NEGATIVE
Spec Grav, UA: 1.02 (ref 1.010–1.025)
Urobilinogen, UA: 4 E.U./dL — AB
pH, UA: 5 (ref 5.0–8.0)

## 2021-08-07 MED ORDER — SULFAMETHOXAZOLE-TRIMETHOPRIM 800-160 MG PO TABS: 1.0000 | ORAL_TABLET | Freq: Two times a day (BID) | ORAL | 0 refills | Status: AC

## 2021-08-07 MED ORDER — SULFAMETHOXAZOLE-TRIMETHOPRIM 800-160 MG PO TABS: 1.0000 | ORAL_TABLET | Freq: Two times a day (BID) | ORAL | 0 refills | Status: DC

## 2021-08-08 ENCOUNTER — Telehealth (HOSPITAL_COMMUNITY): Payer: Self-pay

## 2021-08-09 ENCOUNTER — Encounter (HOSPITAL_COMMUNITY): Payer: Medicare Other

## 2021-08-09 LAB — URINE CULTURE: Culture: >=100000 — AB

## 2021-08-10 ENCOUNTER — Other Ambulatory Visit: Payer: Self-pay | Admitting: Physical Medicine & Rehabilitation

## 2021-08-25 NOTE — Addendum Note (Signed)
Encounter addended by: Filbert Schilder, NP on: 08/25/2021 4:16 PM  Actions taken: Flowsheet accepted, Clinical Note Signed

## 2021-09-01 ENCOUNTER — Telehealth: Payer: Self-pay

## 2021-09-01 NOTE — Telephone Encounter (Signed)
Spoke with patient's daughter [name] and scheduled a Mychart Palliative Consult for 09/05/21 @ 12:30 PM.   Consent obtained; updated N[facility], Team List and Epic.

## 2021-09-05 ENCOUNTER — Telehealth: Payer: Self-pay | Admitting: Physician Assistant

## 2021-09-05 ENCOUNTER — Telehealth: Payer: Medicare Other | Admitting: Internal Medicine

## 2021-09-05 DIAGNOSIS — I5082 Biventricular heart failure: Secondary | ICD-10-CM

## 2021-09-05 DIAGNOSIS — Z515 Encounter for palliative care: Secondary | ICD-10-CM

## 2021-09-05 DIAGNOSIS — Z555 Less than a high school diploma: Secondary | ICD-10-CM

## 2021-09-05 DIAGNOSIS — N1832 Chronic kidney disease, stage 3b: Secondary | ICD-10-CM

## 2021-09-05 DIAGNOSIS — E43 Unspecified severe protein-calorie malnutrition: Secondary | ICD-10-CM

## 2021-09-05 DIAGNOSIS — Z741 Need for assistance with personal care: Secondary | ICD-10-CM

## 2021-09-05 DIAGNOSIS — L89159 Pressure ulcer of sacral region, unspecified stage: Secondary | ICD-10-CM

## 2021-09-05 NOTE — Progress Notes (Addendum)
Therapist, nutritional Palliative Care Consult Note Telephone: [phone]  Fax: [phone]   Date of encounter: 09/05/21 9:40 AM PATIENT NAME: Matthew [name] [address] [place] [address]   [phone] (home)  DOB: 1941-01-29 MRN: [id number] PRIMARY CARE PROVIDER:    Tamala Ser,  [address] [place] [address] [phone]  REFERRING PROVIDER:   Alyson Ingles, PA-C [address] [place],  [place] [address] [phone] Dr. Deatra James at [facility]  RESPONSIBLE PARTY:    Contact Information     Name Relation Home Work Mobile   Latah Daughter [phone]  [phone]   Ibrohim, Buccieri [phone]  [phone]   x,Brenda Sister   [phone]        Due to the COVID-19 crisis, this visit was done via telemedicine from my office and it was initiated and consent by this patient and or family.  I connected with  Matthew [name] OR PROXY on 09/05/21 by a video enabled telemedicine application and verified that I am speaking with the correct person using two identifiers.   I discussed the limitations of evaluation and management by telemedicine. The patient expressed understanding and agreed to proceed.  Palliative Care was asked to follow this patient by consultation request of  Costella, Darci Current, PA* to address advance care planning and complex medical decision making. This is the initial visit.                                     ASSESSMENT AND PLAN / RECOMMENDATIONS:   Advance Care Planning/Goals of Care: Goals include to maximize quality of life and symptom management. Patient/health care surrogate gave his/her permission to discuss.Our advance care planning conversation included a discussion about:    The value and importance of advance care planning  Experiences with loved ones who have been seriously ill or have died  Exploration of personal, cultural or spiritual beliefs that might  influence medical decisions  Exploration of goals of care in the event of a sudden injury or illness  Identification  of a healthcare agent--Joanna is HCPOA, daughter Review and updating or creation of an  advance directive document . Decision not to resuscitate or to de-escalate disease focused treatments due to poor prognosis. CODE STATUS:  Historically FULL CODE.   [name] and her brother are going to get together and get back with me about whether they want to pursue hospice for their dad.  [name] feels like that would be best.    Symptom Management/Plan: 1. Biventricular heart failure (HCC) -pt remains weak, dependent in adls and struggling with intake, has a sacral wound and challenges with fluid balance in view of CKD3b -discussed options for him based on his end stage disease including option of hospice care -recommended family discuss pt's goals as he is clear he is not happy with his QOL since he's been unable to get out and do things and has lost his independence  2. Chronic renal failure, stage 3b (HCC) -given his poor po intake and heart failure, he's high risk for acute on chronic renal failure recurring and continued decline as a result -provided education to his daughter about this  3. Protein-calorie malnutrition, severe -continue to offer supplement shakes and frequent snacks to him, but he is not eating well and showing signs of reaching end of life -again educated about this -he's had tremendous  weight loss as shown above  4. Pressure injury of skin of sacral region, unspecified injury stage -I was not able to view this on the video visit, but he's been receiving treatments for this since his hospitalization and decline post-UTI  5. Has less than high school education -notable especially in combination with delirium he's been experiencing, requires help from family with his health decisions at this time  6. Requires daily assistance for activities of daily living (ADL)  and comfort needs -[name] is supporting him with these items at this time  7. Palliative care by specialist -introduced palliative care vs hospice care -educated about disease progression, symptom mgt -initially attempted to address code status, but realized patient did not have insight into his illness to make this decision in a reasonable way -recommended this be readdressed with the hospice team if he chooses to admit or at our next visit if we continue with palliative care--Joanna will call us back after speaking with her dad and brother to let me know    Follow up Palliative Care Visit: Palliative care will continue to follow for complex medical decision making, advance care planning, and clarification of goals. [name] is going to discuss more with her brother as well as her father further.  She thinks hospice is probably the best option but wants to be sure they are both in agreement with this for him.  She shares that her father has a middle school education and often does not understand a lot of things about his health, plus he's been more confused lately since being ill.  She realizes that the decision-making will fall on her at this time.  This visit was coded based on medical decision making (MDM).  18 mins spent on ACP.  PPS: 30%  HOSPICE ELIGIBILITY/DIAGNOSIS: Biventricular heart failure/yes  Chief Complaint: Initial palliative care mychart visit  HISTORY OF PRESENT ILLNESS:  Matthew [name] is a 81 y.o. year old male  with chronic systolic chf, biventricular heart failure, mitral regurgitation s/p mitral clip placement, PVCs, CKD3b, UTI end of June, severe protein calorie malnutrition, htn, mixed hyperlipidemia, aortic atherosclerosis, ascending aortic aneurysm, CAD, NSVT, cognitive impairment, GERD, h/o PE, and severe major depression seen for initial palliative care mychart visit.   His last hospital admission was end of April to May with heart failure, acute kidney injury,  right LE wound and debility.  He has been to the ED with a dizziness in June felt to be dehydration vs peripheral vertigo and to urgent care later that month with UTI.  He's also had PE in lungs.    He feels ok--good mood.  He slept well last night.  Not sob.  No pain.  His daughter, [name], is with him.  Initially, he was eating, doing fine, but his intake has declined.  He's lost his appetite.  When he went to the doctor, he expressed he could not do things he used to do.  He's been on antidepressants.  It did not help his appetite and mood about the same.  Antidepressant was increased and may try a stimulant.  He has not eaten a full meal in several weeks--it's a few bites only--last time he tried something was on Thursday--ate about 1/4 of a sandwich cut up.  He's getting ensure 350--about 3 per day. She also mixed liquid boost with water for 250 calories one per day. His weight has notably dropped from 184 lbs on 5/10 to 164 lbs on 07/26/21.    [name] helps  with bathing and dressing, and preps meals if he's eating them.  He had been getting around with Latini with wheelchair behind him, but he had a UTI and since then, he's primarily been in the bed for a couple of weeks.  He has a pressure injury on his bottom that the [facility] nurse is managing.  He prefers to stay on his back so hard to get him on his side.  He uses urinal on his own.  Incontinent with bowels.    His cognition has declined as he's not been eating.  He talks like he's aware during conversations, but may not remember grandkids.  He has never been good about keeping track of his medical history.  PT coming today but he's not been doing well on therapy.    History obtained from review of EMR, discussion with primary team, and interview with family, facility staff/caregiver and/or Matthew [name].   I reviewed available labs, medications, imaging, studies and related documents from the EMR.  Records reviewed and summarized above.   ROS Review  of Systems  Constitutional:  Positive for activity change, appetite change, fatigue and unexpected weight change. Negative for chills and fever.  HENT:  Negative for congestion.   Respiratory:  Negative for chest tightness and shortness of breath.   Cardiovascular:  Negative for leg swelling.  Gastrointestinal:  Negative for abdominal pain.       Fecal incontinence  Genitourinary:  Negative for dysuria.       Uses urinal  Musculoskeletal:  Positive for gait problem.  Skin:        Pressure injury reportedly on sacrum/buttocks  Neurological:  Positive for weakness. Negative for dizziness.  Hematological:  Does not bruise/bleed easily.  Psychiatric/Behavioral:  Positive for confusion.     Physical Exam: There were no vitals filed for this visit. There is no height or weight on file to calculate BMI. Wt Readings from Last 500 Encounters:  07/26/21 164 lb (74.4 kg)  07/14/21 165 lb (74.8 kg)  06/22/21 184 lb 1.4 oz (83.5 kg)  06/08/21 176 lb 12.9 oz (80.2 kg)  05/13/21 234 lb 3.2 oz (106.2 kg)  05/08/21 221 lb 1.9 oz (100.3 kg)  04/08/21 228 lb 3.2 oz (103.5 kg)  09/02/20 231 lb (104.8 kg)  08/17/20 236 lb 12.8 oz (107.4 kg)  07/30/20 237 lb 3.2 oz (107.6 kg)  07/08/20 237 lb 9.6 oz (107.8 kg)  05/10/20 243 lb 3.2 oz (110.3 kg)  04/22/19 260 lb 12.8 oz (118.3 kg)  04/15/18 267 lb 6.4 oz (121.3 kg)  10/05/17 254 lb 9.6 oz (115.5 kg)  03/29/17 268 lb (121.6 kg)  01/11/17 288 lb (130.6 kg)  11/21/16 288 lb (130.6 kg)  08/21/16 285 lb (129.3 kg)  12/08/15 293 lb 6.4 oz (133.1 kg)  06/11/15 298 lb 12.8 oz (135.5 kg)  12/11/14 291 lb (132 kg)  05/18/14 290 lb 12.8 oz (131.9 kg)  12/11/13 275 lb (124.7 kg)  07/09/13 263 lb (119.3 kg)  01/07/13 277 lb (125.6 kg)   Physical Exam Constitutional:      General: He is not in acute distress.    Appearance: He is ill-appearing.     Comments: Frail male resting in bed  HENT:     Head: Normocephalic and atraumatic.  Eyes:      Comments: glasses  Pulmonary:     Effort: Pulmonary effort is normal.  Musculoskeletal:        General: Normal range of motion.  Neurological:  General: No focal deficit present.     Mental Status: He is alert.     Motor: Weakness present.     Gait: Gait abnormal.     Comments: Oriented to place, his daughter, but otherwise not  Psychiatric:     Comments: Flat affect     CURRENT PROBLEM LIST:  Patient Active Problem List   Diagnosis Date Noted   Open wound of right lower extremity 06/10/2021   Debility 06/09/2021   Heart failure (HCC) 06/08/2021   Pressure injury of skin 06/06/2021   Acute respiratory failure (HCC) 06/03/2021   Protein-calorie malnutrition, severe 06/02/2021   S/P mitral valve clip implantation 06/02/2021   Frequent PVCs    Dyslipidemia 05/19/2021   AKI (acute kidney injury) (HCC) 05/18/2021   Acute pulmonary embolism (HCC) 05/14/2021   Ascending aortic aneurysm (HCC) 05/14/2021   Hypokalemia 05/08/2021   BPH (benign prostatic hyperplasia) 05/08/2021   Glucosuria 05/08/2021   Acute metabolic encephalopathy 05/07/2021   Acute on chronic congestive heart failure (HCC) 04/04/2021   S/P PICC central line placement 01/30/2017   Medication monitoring encounter 01/30/2017   Normocytic anemia 01/12/2017   Septic arthritis of knee, left (HCC) 01/11/2017   Erectile dysfunction 12/08/2015   Bigeminy 06/11/2015   Congestive dilated cardiomyopathy (HCC) 12/11/2013   Obesity 01/07/2013   Coronary atherosclerosis of native coronary artery 01/07/2013   Mixed hyperlipidemia 01/07/2013   Essential hypertension, benign 01/07/2013   PAST MEDICAL HISTORY:  Active Ambulatory Problems    Diagnosis Date Noted   Obesity 01/07/2013   Coronary atherosclerosis of native coronary artery 01/07/2013   Mixed hyperlipidemia 01/07/2013   Essential hypertension, benign 01/07/2013   Congestive dilated cardiomyopathy (HCC) 12/11/2013   Bigeminy 06/11/2015   Erectile  dysfunction 12/08/2015   Septic arthritis of knee, left (HCC) 01/11/2017   Normocytic anemia 01/12/2017   S/P PICC central line placement 01/30/2017   Medication monitoring encounter 01/30/2017   Acute on chronic congestive heart failure (HCC) 04/04/2021   Acute metabolic encephalopathy 05/07/2021   Hypokalemia 05/08/2021   BPH (benign prostatic hyperplasia) 05/08/2021   Glucosuria 05/08/2021   Acute pulmonary embolism (HCC) 05/14/2021   Ascending aortic aneurysm (HCC) 05/14/2021   AKI (acute kidney injury) (HCC) 05/18/2021   Dyslipidemia 05/19/2021   Frequent PVCs    Protein-calorie malnutrition, severe 06/02/2021   S/P mitral valve clip implantation 06/02/2021   Acute respiratory failure (HCC) 06/03/2021   Pressure injury of skin 06/06/2021   Heart failure (HCC) 06/08/2021   Debility 06/09/2021   Open wound of right lower extremity 06/10/2021   Resolved Ambulatory Problems    Diagnosis Date Noted   Arthritis, septic, knee (HCC) 01/11/2017   Hyponatremia 01/11/2017   Septic joint of left knee joint (HCC) 01/11/2017   Past Medical History:  Diagnosis Date   CAD (coronary artery disease)    Chronic HFrEF (heart failure with reduced ejection fraction) (HCC)    Chronic kidney disease, stage 3b (HCC)    Encephalopathy 04/2021   HTN (hypertension)    Hyperlipidemia    NICM (nonischemic cardiomyopathy) (HCC)    Osteoarthritis    Severe mitral regurgitation    Thoracic aortic aneurysm (TAA) (HCC)    Thrombocytopenia (HCC)    Tricuspid regurgitation    SOCIAL HX:  Social History   Tobacco Use   Smoking status: Never   Smokeless tobacco: Never  Substance Use Topics   Alcohol use: No   FAMILY HX:  Family History  Problem Relation Age of Onset   Hypertension Father  Diabetes Brother    Heart attack Neg Hx    Stroke Neg Hx       ALLERGIES: No Known Allergies    PERTINENT MEDICATIONS:  Outpatient Encounter Medications as of 09/05/2021  Medication Sig    acetaminophen (TYLENOL) 325 MG tablet Take 1-2 tablets (325-650 mg total) by mouth every 4 (four) hours as needed for mild pain.   amiodarone (PACERONE) 400 MG tablet Take 1 tablet (400 mg total) by mouth 2 (two) times daily.   apixaban (ELIQUIS) 5 MG TABS tablet Take 1 tablet (5 mg total) by mouth 2 (two) times daily.   atorvastatin (LIPITOR) 10 MG tablet Take 1 tablet (10 mg total) by mouth daily.   leptospermum manuka honey (MEDIHONEY) PSTE paste Apply 1 application. topically daily.   magnesium oxide (MAG-OX) 400 (240 Mg) MG tablet Take 1 tablet (400 mg total) by mouth daily.   mexiletine (MEXITIL) 150 MG capsule Take 2 capsules (300 mg total) by mouth every 12 (twelve) hours.   midodrine (PROAMATINE) 5 MG tablet Take 3 tablets (15 mg total) by mouth 3 (three) times daily with meals.   pantoprazole (PROTONIX) 40 MG tablet Take 1 tablet (40 mg total) by mouth daily.   sertraline (ZOLOFT) 25 MG tablet TAKE 1 TABLET(25 MG) BY MOUTH DAILY   torsemide (DEMADEX) 20 MG tablet Take 1 tablet (20 mg total) by mouth every other day.   traMADol (ULTRAM) 50 MG tablet Take 50 mg by mouth every 8 (eight) hours as needed.   No facility-administered encounter medications on file as of 09/05/2021.    Thank you for the opportunity to participate in the care of Matthew [name].  The palliative care team will continue to follow. Please call our office at [phone] if we can be of additional assistance.   Hollace Kinnier, DO  COVID-19 PATIENT SCREENING TOOL Asked and negative response unless otherwise noted:  Have you had symptoms of covid, tested positive or been in contact with someone with symptoms/positive test in the past 5-10 days?  NO   Addendum:  Di Kindle called me back on 09/07/21 and shared that she and her brother had a 2 hr discussion with her dad and could better tell that he was not really not happy with his quality of life, but he has a poor understanding of his health conditions and outcomes.  They  are agreeable to hospice care for him.  I reached out to the [facility] office to ask if Dr. Nancy Fetter would be willing to provide an order for hospice care, agreed with a prognosis of 6 mos or less, and would be willing to serve as attending of record for hospice care.  Message was left with staff there.

## 2021-09-06 ENCOUNTER — Telehealth (HOSPITAL_COMMUNITY): Payer: Self-pay

## 2021-09-06 NOTE — Progress Notes (Incomplete)
HEART & VASCULAR TRANSITION OF CARE CONSULT NOTE     Referring Physician:Dr Candiss Norse Primary Care: Establishing  Dr Redmond Pulling in April  Primary Cardiologist:Dr Irish Lack  HPI: Referred to clinic by Dr Candiss Norse for heart failure consultation.   Matthew [name] is an 81 year old with h/o NICM/chronic systolic CHF, CAD, HTN, obesity. Has longstanding history of cardiomyopathy. EF previously improved to 50-55% in 2014. Had cath 2012 severe stenosis of the diagonal, which is not severe enough to cause his EF reduction.  Last saw [name] in 03/22. Echo repeat, EF down to 25-30%, RV mildly reduced, mild Matthew. He was referred to PharmD for titration of GDMT, last visit July 2022.  Admitted 03/2021 with a/c CHF. Had not been taking his CHF medications or diuretics several days prior to admission.BNP on admission was >4,500 and CXR showed cardiomegaly with mild pulmonary edema. 2D Echo showed severe dilated biventricular heart failure, LVEF 20 to 25%, RV severely enlarged and severely reduced severe mitral and tricuspid regurgitation and severe BAE. Severely elevated RVSP, 50 mmHg. Cardiology consulted. Diuresed with lasix drip+ metolazone.  Cardiology adjusted his HF meds and he was discharged on Lasix 20mg  BID, Coreg 25mg  BID, Entresto 24/26mg  BID, Spironolactone 25mg  daily, and farxiga 10mg  daily.  Admitted 05/08/21 with AKI and metabolic encephalopathy. Creatinine 1.65. This was felt to be in the setting of recent UTI and spironolactone. Given IV fluids. HF meds restarted. Discharged 05/10/21.   He presents today with his son. Feels ok. Limited around the house. He gets SOB with exertion. Has good days and bad days. Denies PND/Orthopnea. No chest pain. Having leg swelling. Appetite ok. No fever or chills. Weight at home has been stable. His daughter helps him with medications. Home health following a few days a week. He no longer drives. Taking all medications.  Cardiac Testing  Echo 03/2021 LVEF 20-25%. RV  severely reduced. LA/RA severely reduced. Severe Matthew/Moderate TR Echo 2022  LVEF 25-30% Mild Matthew  Echo 2014 LVEF 50-55%  Review of Systems: [y] = yes, [ ]  = no   General: Weight gain [ ] ; Weight loss [ ] ; Anorexia [ ] ; Fatigue [ Y]; Fever [ ] ; Chills [ ] ; Weakness [ Y]  Cardiac: Chest pain/pressure [ ] ; Resting SOB [ ] ; Exertional SOB [Y ]; Orthopnea [ ] ; Pedal Edema [ Y]; Palpitations [ ] ; Syncope [ ] ; Presyncope [ ] ; Paroxysmal nocturnal dyspnea[ ]   Pulmonary: Cough [ ] ; Wheezing[ ] ; Hemoptysis[ ] ; Sputum [ ] ; Snoring [ ]   GI: Vomiting[ ] ; Dysphagia[ ] ; Melena[ ] ; Hematochezia [ ] ; Heartburn[ ] ; Abdominal pain [ ] ; Constipation [ ] ; Diarrhea [ ] ; BRBPR [ ]   GU: Hematuria[ ] ; Dysuria [ ] ; Nocturia[ ]   Vascular: Pain in legs with walking [ ] ; Pain in feet with lying flat [ ] ; Non-healing sores [ ] ; Stroke [ ] ; TIA [ ] ; Slurred speech [ ] ;  Neuro: Headaches[ ] ; Vertigo[ ] ; Seizures[ ] ; Paresthesias[ ] ;Blurred vision [ ] ; Diplopia [ ] ; Vision changes [ ]   Ortho/Skin: Arthritis [ ] ; Joint pain [ Y]; Muscle pain [ ] ; Joint swelling [ ] ; Back Pain [ Y]; Rash [ ]   Psych: Depression[ ] ; Anxiety[ ]   Heme: Bleeding problems [ ] ; Clotting disorders [ ] ; Anemia [ ]   Endocrine: Diabetes [ ] ; Thyroid dysfunction[ ]    Past Medical History:  Diagnosis Date   CAD (coronary artery disease)    Chronic HFrEF (heart failure with reduced ejection fraction) (HCC)    Chronic kidney disease, stage 3b (  HCC)    Encephalopathy 04/2021   admitted for metabolic encephalopathy   HTN (hypertension)    Hyperlipidemia    NICM (nonischemic cardiomyopathy) (HCC)    Obesity    Osteoarthritis    S/P mitral valve clip implantation 06/02/2021   MitraClip XTW x2 with Dr. Lynnette Caffey and Dr. Excell Seltzer   Severe mitral regurgitation    Thoracic aortic aneurysm (TAA) (HCC)    Thrombocytopenia (HCC)    noted in labs in 2023   Tricuspid regurgitation     Current Outpatient Medications  Medication Sig Dispense Refill    acetaminophen (TYLENOL) 325 MG tablet Take 1-2 tablets (325-650 mg total) by mouth every 4 (four) hours as needed for mild pain.     amiodarone (PACERONE) 400 MG tablet Take 1 tablet (400 mg total) by mouth 2 (two) times daily. 180 tablet 0   apixaban (ELIQUIS) 5 MG TABS tablet Take 1 tablet (5 mg total) by mouth 2 (two) times daily. 60 tablet 6   atorvastatin (LIPITOR) 10 MG tablet Take 1 tablet (10 mg total) by mouth daily. 30 tablet 0   leptospermum manuka honey (MEDIHONEY) PSTE paste Apply 1 application. topically daily.     magnesium oxide (MAG-OX) 400 (240 Mg) MG tablet Take 1 tablet (400 mg total) by mouth daily. 30 tablet 6   mexiletine (MEXITIL) 150 MG capsule Take 2 capsules (300 mg total) by mouth every 12 (twelve) hours. 60 capsule 6   midodrine (PROAMATINE) 5 MG tablet Take 3 tablets (15 mg total) by mouth 3 (three) times daily with meals. 810 tablet 0   pantoprazole (PROTONIX) 40 MG tablet Take 1 tablet (40 mg total) by mouth daily. 90 tablet 0   sertraline (ZOLOFT) 25 MG tablet TAKE 1 TABLET(25 MG) BY MOUTH DAILY 90 tablet 0   torsemide (DEMADEX) 20 MG tablet Take 1 tablet (20 mg total) by mouth every other day. 45 tablet 0   traMADol (ULTRAM) 50 MG tablet Take 50 mg by mouth every 8 (eight) hours as needed.     No current facility-administered medications for this visit.    No Known Allergies     Social History   Socioeconomic History   Marital status: Unknown    Spouse name: Not on file   Number of children: Not on file   Years of education: Not on file   Highest education level: 11th grade  Occupational History   Occupation: retired  Tobacco Use   Smoking status: Never   Smokeless tobacco: Never  Vaping Use   Vaping Use: Never used  Substance and Sexual Activity   Alcohol use: No   Drug use: No   Sexual activity: Not on file  Other Topics Concern   Not on file  Social History Narrative   Not on file   Social Determinants of Health   Financial Resource  Strain: Low Risk  (04/05/2021)   Overall Financial Resource Strain (CARDIA)    Difficulty of Paying Living Expenses: Not very hard  Food Insecurity: No Food Insecurity (04/05/2021)   Hunger Vital Sign    Worried About Running Out of Food in the Last Year: Never true    Ran Out of Food in the Last Year: Never true  Transportation Needs: No Transportation Needs (04/05/2021)   PRAPARE - Administrator, Civil Service (Medical): No    Lack of Transportation (Non-Medical): No  Physical Activity: Not on file  Stress: Not on file  Social Connections: Not on file  Intimate Partner  Violence: Not on file      Family History  Problem Relation Age of Onset   Hypertension Father    Diabetes Brother    Heart attack Neg Hx    Stroke Neg Hx     There were no vitals filed for this visit.   PHYSICAL EXAM: General:  Elderly arrived in wheel chair.  No respiratory difficulty HEENT: normal Neck: supple. JVP 8-9. Carotids 2+ bilat; no bruits. No lymphadenopathy or thryomegaly appreciated. Cor: PMI nondisplaced. Regular rate & rhythm. No rubs, gallops . LLSB/ Apex 3/6 . Lungs: clear Abdomen: soft, nontender, nondistended. No hepatosplenomegaly. No bruits or masses. Good bowel sounds. Extremities: no cyanosis, clubbing, rash, R and LLE 1+ edema. LLE dressing  Neuro: alert & oriented x 3, cranial nerves grossly intact. moves all 4 extremities w/o difficulty. Affect pleasant.  ECG:SR with occasional PVCs QRS 130 ms    ASSESSMENT & PLAN: 1. Chronic Biventricular HF  -Echo 04/04/21 EF LVEF 20-25%, RV moderately reduced, LA/RA severely dilated, and severe Matthew. Previous ECHO 04/2020 EF 25-30% with mild Matthew.  Most recent cath 2012.  - Given worsening biventricular function and worsening valvular disease will need repeat cath /TEE to help sort out.  NYHA III. Volume status elevated. Increase lasix to 40 mg in am and continue lasix 20 mg in pm.  - Continue Toprol XL 37.5 mg daily at bedtime.  -  Continue entresto 24-26 mg twice a day  - Continue spiro 25 mg daily MRA -Continue farxiga. If has another UTI will need to stop.  - Check BMET today   2. CAD - Cath 2012- severe stenosis of the diagonal - No chest pain.  - Set up repeat cath to reassess coronaries given reduced EF . - Check precath labs.   3. Mitral Regurgitation -Recent Echo 03/2021 severe Matthew.  -Will need TEE to further assess. ? If needs clip   4. Hyperlipidemia On statin.   Set up RHC/LHC and TEE in the next few weeks. Dr Gala Romney reviewed and discussed options with him. He would like to pursue cath/TEE. Benefits/risk discussed.   Referred to HFSW (PCP, Medications, Transportation, ETOH Abuse, Drug Abuse, Insurance, Financial ):  No Refer to Pharmacy:  No Refer to Home Health: No Refer to [facility]: Yes - Dr Gala Romney  General Cardiology: Shared Dr Isabel Caprice   Follow up  6 weeks with Dr Gala Romney.   Jacklynn Ganong NP-C  10:49 AM  Patient seen and examined with the above-signed Advanced Practice Provider and/or Housestaff. I personally reviewed laboratory data, imaging studies and relevant notes. I independently examined the patient and formulated the important aspects of the plan. I have edited the note to reflect any of my changes or salient points. I have personally discussed the plan with the patient and/or family.  81 y/o male with CAD, chronic systolic HF and severe Matthew. Recent admit with AKI and metabolic encephalopathy. Creatinine 1.65. This was felt to be in the setting of recent UTI and spironolactone. Referred to TOC.   Here with his son. NYHA IIIB. Recent echo EF 25-30% with severe central Matthew. EF has not improved with GDMT. Markedly volume overloaded on exam  General:  Elderly frail obese male No resp difficulty HEENT: normal Neck: supple. JVP to ear . Carotids 2+ bilat; no bruits. No lymphadenopathy or thryomegaly appreciated. Cor: PMI nondisplaced. Regular rate & rhythm.  3/6 Matthew Lungs: clear Abdomen: obese soft, nontender, nondistended. No hepatosplenomegaly. No bruits or masses. Good bowel sounds.  Extremities: no cyanosis, clubbing, rash, 2-3+ edema Neuro: alert & orientedx3, cranial nerves grossly intact. moves all 4 extremities w/o difficulty. Affect pleasant  He has severe biventricular dysfunction due to NICM with severe functional Matthew. Now with marked volume overload. Agree with increasing diuretics. We discussed continuing with conservative therapy vs eye toward possible MV clip. They would like to pursue mTEER if he is a candidate.  Will plan R/L cath and TEE.  Total time spent 45 minutes. Over half that time spent discussing above.   Rafael Bihari, FNP  10:49 AM

## 2021-09-06 NOTE — Telephone Encounter (Signed)
Called and spoke to patient but I was unable to confirm/remind patient of their appointment at the [facility] on 09/07/21.  In addition, Patient's phone clicked off twice while I was on the call.  Patient reminded to bring all medications and/or complete list.

## 2021-09-07 ENCOUNTER — Encounter (HOSPITAL_COMMUNITY): Payer: Self-pay

## 2021-09-07 ENCOUNTER — Ambulatory Visit (HOSPITAL_COMMUNITY)
Admission: RE | Admit: 2021-09-07 | Discharge: 2021-09-07 | Disposition: A | Discharge status: Home / Self Care | Payer: Medicare Other | Source: Ambulatory Visit | Attending: Family Medicine | Admitting: Family Medicine

## 2021-09-07 ENCOUNTER — Telehealth: Payer: Self-pay | Admitting: Internal Medicine

## 2021-09-07 VITALS — BP 100/62 | HR 60

## 2021-09-07 DIAGNOSIS — E669 Obesity, unspecified: Secondary | ICD-10-CM | POA: Diagnosis not present

## 2021-09-07 DIAGNOSIS — I5022 Chronic systolic (congestive) heart failure: Secondary | ICD-10-CM | POA: Diagnosis not present

## 2021-09-07 DIAGNOSIS — Z7902 Long term (current) use of antithrombotics/antiplatelets: Secondary | ICD-10-CM | POA: Diagnosis not present

## 2021-09-07 DIAGNOSIS — G9341 Metabolic encephalopathy: Secondary | ICD-10-CM | POA: Insufficient documentation

## 2021-09-07 DIAGNOSIS — I4510 Unspecified right bundle-branch block: Secondary | ICD-10-CM | POA: Insufficient documentation

## 2021-09-07 DIAGNOSIS — E43 Unspecified severe protein-calorie malnutrition: Secondary | ICD-10-CM | POA: Insufficient documentation

## 2021-09-07 DIAGNOSIS — R262 Difficulty in walking, not elsewhere classified: Secondary | ICD-10-CM | POA: Insufficient documentation

## 2021-09-07 DIAGNOSIS — I2510 Atherosclerotic heart disease of native coronary artery without angina pectoris: Secondary | ICD-10-CM | POA: Insufficient documentation

## 2021-09-07 DIAGNOSIS — I081 Rheumatic disorders of both mitral and tricuspid valves: Secondary | ICD-10-CM | POA: Insufficient documentation

## 2021-09-07 DIAGNOSIS — E782 Mixed hyperlipidemia: Secondary | ICD-10-CM

## 2021-09-07 DIAGNOSIS — I493 Ventricular premature depolarization: Secondary | ICD-10-CM | POA: Insufficient documentation

## 2021-09-07 DIAGNOSIS — Z79899 Other long term (current) drug therapy: Secondary | ICD-10-CM | POA: Insufficient documentation

## 2021-09-07 DIAGNOSIS — I472 Ventricular tachycardia, unspecified: Secondary | ICD-10-CM | POA: Diagnosis not present

## 2021-09-07 DIAGNOSIS — N179 Acute kidney failure, unspecified: Secondary | ICD-10-CM | POA: Diagnosis not present

## 2021-09-07 DIAGNOSIS — N1831 Chronic kidney disease, stage 3a: Secondary | ICD-10-CM | POA: Diagnosis not present

## 2021-09-07 DIAGNOSIS — I130 Hypertensive heart and chronic kidney disease with heart failure and stage 1 through stage 4 chronic kidney disease, or unspecified chronic kidney disease: Secondary | ICD-10-CM | POA: Insufficient documentation

## 2021-09-07 DIAGNOSIS — E46 Unspecified protein-calorie malnutrition: Secondary | ICD-10-CM

## 2021-09-07 DIAGNOSIS — I959 Hypotension, unspecified: Secondary | ICD-10-CM | POA: Insufficient documentation

## 2021-09-07 DIAGNOSIS — I5082 Biventricular heart failure: Secondary | ICD-10-CM | POA: Insufficient documentation

## 2021-09-07 DIAGNOSIS — E785 Hyperlipidemia, unspecified: Secondary | ICD-10-CM | POA: Diagnosis not present

## 2021-09-07 DIAGNOSIS — N183 Chronic kidney disease, stage 3 unspecified: Secondary | ICD-10-CM

## 2021-09-07 DIAGNOSIS — Z7901 Long term (current) use of anticoagulants: Secondary | ICD-10-CM | POA: Diagnosis not present

## 2021-09-07 DIAGNOSIS — R627 Adult failure to thrive: Secondary | ICD-10-CM

## 2021-09-07 DIAGNOSIS — I428 Other cardiomyopathies: Secondary | ICD-10-CM | POA: Insufficient documentation

## 2021-09-07 DIAGNOSIS — Z9889 Other specified postprocedural states: Secondary | ICD-10-CM

## 2021-09-07 DIAGNOSIS — I340 Nonrheumatic mitral (valve) insufficiency: Secondary | ICD-10-CM | POA: Diagnosis not present

## 2021-09-07 DIAGNOSIS — I361 Nonrheumatic tricuspid (valve) insufficiency: Secondary | ICD-10-CM

## 2021-09-07 DIAGNOSIS — I2782 Chronic pulmonary embolism: Secondary | ICD-10-CM

## 2021-09-07 DIAGNOSIS — Z7984 Long term (current) use of oral hypoglycemic drugs: Secondary | ICD-10-CM | POA: Diagnosis not present

## 2021-09-07 DIAGNOSIS — I25118 Atherosclerotic heart disease of native coronary artery with other forms of angina pectoris: Secondary | ICD-10-CM | POA: Diagnosis not present

## 2021-09-07 DIAGNOSIS — Z95818 Presence of other cardiac implants and grafts: Secondary | ICD-10-CM

## 2021-09-07 LAB — COMPREHENSIVE METABOLIC PANEL
ALT: 267 U/L — ABNORMAL HIGH (ref 0–44)
AST: 432 U/L — ABNORMAL HIGH (ref 15–41)
Albumin: 2.6 g/dL — ABNORMAL LOW (ref 3.5–5.0)
Alkaline Phosphatase: 181 U/L — ABNORMAL HIGH (ref 38–126)
Anion gap: 7 (ref 5–15)
BUN: 62 mg/dL — ABNORMAL HIGH (ref 8–23)
CO2: 22 mmol/L (ref 22–32)
Calcium: 8.8 mg/dL — ABNORMAL LOW (ref 8.9–10.3)
Chloride: 104 mmol/L (ref 98–111)
Creatinine, Ser: 2.07 mg/dL — ABNORMAL HIGH (ref 0.61–1.24)
GFR, Estimated: 32 mL/min — ABNORMAL LOW (ref >60)
Glucose, Bld: 111 mg/dL — ABNORMAL HIGH (ref 70–99)
Potassium: 4.7 mmol/L (ref 3.5–5.1)
Sodium: 133 mmol/L — ABNORMAL LOW (ref 135–145)
Total Bilirubin: 0.7 mg/dL (ref 0.3–1.2)
Total Protein: 7.6 g/dL (ref 6.5–8.1)

## 2021-09-07 LAB — CBC
HCT: 36.3 % — ABNORMAL LOW (ref 39.0–52.0)
Hemoglobin: 11.7 g/dL — ABNORMAL LOW (ref 13.0–17.0)
MCH: 27.3 pg (ref 26.0–34.0)
MCHC: 32.2 g/dL (ref 30.0–36.0)
MCV: 84.6 fL (ref 80.0–100.0)
Platelets: 260 10*3/uL (ref 150–400)
RBC: 4.29 MIL/uL (ref 4.22–5.81)
RDW: 18.5 % — ABNORMAL HIGH (ref 11.5–15.5)
WBC: 8.6 10*3/uL (ref 4.0–10.5)
nRBC: 0 % (ref 0.0–0.2)

## 2021-09-07 LAB — BRAIN NATRIURETIC PEPTIDE: B Natriuretic Peptide: 364.4 pg/mL — ABNORMAL HIGH (ref 0.0–100.0)

## 2021-09-07 MED ORDER — AMIODARONE HCL 400 MG PO TABS: 200.0000 mg | ORAL_TABLET | Freq: Two times a day (BID) | ORAL | 3 refills | Status: DC

## 2021-09-07 MED ORDER — TORSEMIDE 20 MG PO TABS: 20.0000 mg | ORAL_TABLET | ORAL | 0 refills | Status: DC | PRN

## 2021-09-07 NOTE — Addendum Note (Signed)
Encounter addended by: Jacklynn Ganong, FNP on: 09/07/2021 9:39 PM  Actions taken: Clinical Note Signed

## 2021-09-07 NOTE — Patient Instructions (Addendum)
Stop Amiodarone   Change torsemide to as needed for swelling and weight gain of 3lb in 24 hours or 5lb in a week    Labs done today, your results will be available in MyChart, we will contact you for abnormal readings.  Your physician has requested that you have an echocardiogram. Echocardiography is a painless test that uses sound waves to create images of your heart. It provides your doctor with information about the size and shape of your heart and how well your heart's chambers and valves are working. This procedure takes approximately one hour. There are no restrictions for this procedure.  Your physician recommends that you schedule a follow-up appointment in: 3 months (October 2023)  ** please call office in [facility] to arrange your follow up appointment **  If you have any questions or concerns before your next appointment please send [place] a message through [place] or call our office at [phone].    TO LEAVE A MESSAGE FOR THE NURSE SELECT OPTION 2, PLEASE LEAVE A MESSAGE INCLUDING: YOUR NAME DATE OF BIRTH CALL BACK NUMBER REASON FOR CALL**this is important as we prioritize the call backs  YOU WILL RECEIVE A CALL BACK THE SAME DAY AS LONG AS YOU CALL BEFORE 4:00 PM  At the [facility], you and your health needs are our priority. As part of our continuing mission to provide you with exceptional heart care, we have created designated Provider Care Teams. These Care Teams include your primary Cardiologist (physician) and Advanced Practice Providers (APPs- Physician Assistants and Nurse Practitioners) who all work together to provide you with the care you need, when you need it.   You may see any of the following providers on your designated Care Team at your next follow up: Dr Arvilla Meres Dr Carron Curie, NP Robbie Lis, [place] [facility] [place], [place] Karle Plumber, PharmD   Please be sure to bring in all your medications bottles  to every appointment.

## 2021-09-07 NOTE — Telephone Encounter (Signed)
Matthew [name] left me a message re: the discussion she and her brother had with her dad for over 2 hrs after the palliative consult on Monday.  They talked with him and he is not happy with his quality of life.  He has no desire to work on getting stronger with therapy or eating better to prolong his life.  They mentioned hospice to him and they are on board with this referral due to his weight loss, poor intake, failure to thrive/debility in the context of his heart failure.  I explained the process of initiating hospice if desired.  Matthew [name] did request that I reach out to Dr. Mathews Robinsons PA to request the referral.  I did explain that the team is likely to ask about his code status again and it seems that [name] does not have the insight into his health at this time to make that judgment in view of his other perspective mentioned above.  Matthew [name] and her brother realize this and feel more comfortable making a call about it to focus on his comfort at this time after their conversation with him on Monday/Tuesday.  I reached out to Dr. Chase Caller office and left a message asking about her agreement with his prognosis as well as whether she will serve as attending of record for hospice care.  Requested that referral to [facility] be made if they agree.

## 2021-09-08 ENCOUNTER — Telehealth (HOSPITAL_COMMUNITY): Payer: Self-pay | Admitting: Cardiology

## 2021-09-08 DIAGNOSIS — I5022 Chronic systolic (congestive) heart failure: Secondary | ICD-10-CM

## 2021-09-08 NOTE — Telephone Encounter (Signed)
-----   Message from Jacklynn Ganong, [place] sent at 09/07/2021  7:52 PM EDT ----- LFTs significantly elevated. Amiodarone stopped at today's visit.  He needs to stop his atorvastatin too.  Repeat CMET in 1 week, if he and his family are agreeable.

## 2021-09-08 NOTE — Telephone Encounter (Signed)
Patient called.  Patient aware via daughter  

## 2021-09-09 ENCOUNTER — Encounter: Payer: Self-pay | Admitting: Internal Medicine

## 2021-09-09 ENCOUNTER — Telehealth (HOSPITAL_COMMUNITY): Payer: Self-pay | Admitting: Surgery

## 2021-09-09 NOTE — Telephone Encounter (Signed)
I called and spoke with daughter to let her know to stop Mexilitine per provider. She is aware and agreeable.  Medlist in [facility] updated.

## 2021-09-09 NOTE — Telephone Encounter (Signed)
-----   Message from Jacklynn Ganong, [place] sent at 09/09/2021  8:11 AM EDT ----- Discussed with Pharmacist, please hold mexiletine for now as well. Will follow liver enzymes and can potentially restart when levels return to normal.  Please call daughter.

## 2021-09-16 ENCOUNTER — Other Ambulatory Visit (HOSPITAL_COMMUNITY): Payer: Medicare Other

## 2021-09-20 ENCOUNTER — Other Ambulatory Visit (HOSPITAL_COMMUNITY): Payer: Self-pay

## 2021-10-21 ENCOUNTER — Encounter: Payer: Medicare Other | Admitting: Physical Medicine & Rehabilitation

## 2021-11-16 NOTE — Telephone Encounter (Signed)
Error

## 2022-01-13 DEATH — deceased

## 2022-03-23 ENCOUNTER — Telehealth: Payer: Self-pay

## 2022-03-23 NOTE — Telephone Encounter (Signed)
Called to discuss 1 year MitraClip echocardiogram and office visit.  Left message to call back.  MitraClip completed 06/03/2022. The patient is currently in palliative care. Depending on ability, may only complete [facility].

## 2022-05-31 NOTE — Telephone Encounter (Signed)
Spoke with the patient's daughter, [name]. The patient passed away 16-Jan-2022. Will mark as deceased in chart. Condolences given.
